# Patient Record
Sex: Female | Born: 1935 | Race: White | Hispanic: No | Smoking: Never smoker
Health system: Southern US, Community
[De-identification: ages and names within clinical notes are randomized; demographics above are authoritative.]

## PROBLEM LIST (undated history)

## (undated) DIAGNOSIS — I350 Nonrheumatic aortic (valve) stenosis: Secondary | ICD-10-CM

## (undated) DIAGNOSIS — I739 Peripheral vascular disease, unspecified: Secondary | ICD-10-CM

## (undated) DIAGNOSIS — R51 Headache: Secondary | ICD-10-CM

## (undated) DIAGNOSIS — I5032 Chronic diastolic (congestive) heart failure: Secondary | ICD-10-CM

## (undated) DIAGNOSIS — M1990 Unspecified osteoarthritis, unspecified site: Secondary | ICD-10-CM

## (undated) DIAGNOSIS — I779 Disorder of arteries and arterioles, unspecified: Secondary | ICD-10-CM

## (undated) DIAGNOSIS — R519 Headache, unspecified: Secondary | ICD-10-CM

## (undated) DIAGNOSIS — C189 Malignant neoplasm of colon, unspecified: Secondary | ICD-10-CM

## (undated) DIAGNOSIS — N184 Chronic kidney disease, stage 4 (severe): Secondary | ICD-10-CM

## (undated) DIAGNOSIS — F418 Other specified anxiety disorders: Secondary | ICD-10-CM

## (undated) DIAGNOSIS — N390 Urinary tract infection, site not specified: Secondary | ICD-10-CM

## (undated) DIAGNOSIS — Z9289 Personal history of other medical treatment: Secondary | ICD-10-CM

## (undated) DIAGNOSIS — K819 Cholecystitis, unspecified: Secondary | ICD-10-CM

## (undated) DIAGNOSIS — K8042 Calculus of bile duct with acute cholecystitis without obstruction: Secondary | ICD-10-CM

## (undated) DIAGNOSIS — J90 Pleural effusion, not elsewhere classified: Secondary | ICD-10-CM

## (undated) DIAGNOSIS — D696 Thrombocytopenia, unspecified: Secondary | ICD-10-CM

## (undated) DIAGNOSIS — I10 Essential (primary) hypertension: Secondary | ICD-10-CM

## (undated) DIAGNOSIS — I482 Chronic atrial fibrillation, unspecified: Secondary | ICD-10-CM

## (undated) DIAGNOSIS — D649 Anemia, unspecified: Secondary | ICD-10-CM

## (undated) DIAGNOSIS — E118 Type 2 diabetes mellitus with unspecified complications: Secondary | ICD-10-CM

## (undated) DIAGNOSIS — A0472 Enterocolitis due to Clostridium difficile, not specified as recurrent: Secondary | ICD-10-CM

## (undated) HISTORY — DX: Anemia, unspecified: D64.9

## (undated) HISTORY — DX: Enterocolitis due to Clostridium difficile, not specified as recurrent: A04.72

## (undated) HISTORY — DX: Peripheral vascular disease, unspecified: I73.9

## (undated) HISTORY — DX: Chronic kidney disease, stage 4 (severe): N18.4

## (undated) HISTORY — PX: FRACTURE SURGERY: SHX138

## (undated) HISTORY — PX: ABDOMINAL HYSTERECTOMY: SHX81

## (undated) HISTORY — DX: Calculus of bile duct with acute cholecystitis without obstruction: K80.42

## (undated) HISTORY — DX: Disorder of arteries and arterioles, unspecified: I77.9

## (undated) HISTORY — PX: COLOSTOMY: SHX63

## (undated) HISTORY — DX: Personal history of other medical treatment: Z92.89

## (undated) HISTORY — DX: Type 2 diabetes mellitus with unspecified complications: E11.8

## (undated) HISTORY — PX: APPENDECTOMY: SHX54

## (undated) HISTORY — DX: Chronic atrial fibrillation, unspecified: I48.20

## (undated) HISTORY — DX: Chronic diastolic (congestive) heart failure: I50.32

---

## 1998-05-02 ENCOUNTER — Emergency Department (HOSPITAL_COMMUNITY): Admission: EM | Admit: 1998-05-02 | Discharge: 1998-05-02 | Payer: Self-pay

## 1999-07-18 ENCOUNTER — Encounter: Payer: Self-pay | Admitting: Emergency Medicine

## 1999-07-18 ENCOUNTER — Emergency Department (HOSPITAL_COMMUNITY): Admission: EM | Admit: 1999-07-18 | Discharge: 1999-07-18 | Payer: Self-pay | Admitting: Emergency Medicine

## 2000-03-24 HISTORY — PX: COLON SURGERY: SHX602

## 2000-09-18 ENCOUNTER — Encounter (INDEPENDENT_AMBULATORY_CARE_PROVIDER_SITE_OTHER): Payer: Self-pay | Admitting: Specialist

## 2000-09-18 ENCOUNTER — Ambulatory Visit (HOSPITAL_COMMUNITY): Admission: RE | Admit: 2000-09-18 | Discharge: 2000-09-18 | Payer: Self-pay | Admitting: Gastroenterology

## 2000-09-25 ENCOUNTER — Encounter: Payer: Self-pay | Admitting: Surgery

## 2000-09-25 ENCOUNTER — Encounter: Admission: RE | Admit: 2000-09-25 | Discharge: 2000-09-25 | Payer: Self-pay | Admitting: Surgery

## 2000-10-15 ENCOUNTER — Encounter: Payer: Self-pay | Admitting: Surgery

## 2000-10-19 ENCOUNTER — Ambulatory Visit (HOSPITAL_COMMUNITY): Admission: RE | Admit: 2000-10-19 | Discharge: 2000-10-19 | Payer: Self-pay | Admitting: Cardiology

## 2000-10-21 ENCOUNTER — Encounter (INDEPENDENT_AMBULATORY_CARE_PROVIDER_SITE_OTHER): Payer: Self-pay | Admitting: Specialist

## 2000-10-21 ENCOUNTER — Inpatient Hospital Stay (HOSPITAL_COMMUNITY): Admission: RE | Admit: 2000-10-21 | Discharge: 2000-10-27 | Payer: Self-pay | Admitting: Surgery

## 2001-08-09 ENCOUNTER — Encounter: Payer: Self-pay | Admitting: Hematology and Oncology

## 2001-08-09 ENCOUNTER — Ambulatory Visit (HOSPITAL_COMMUNITY): Admission: RE | Admit: 2001-08-09 | Discharge: 2001-08-09 | Payer: Self-pay | Admitting: Hematology and Oncology

## 2001-11-09 ENCOUNTER — Ambulatory Visit (HOSPITAL_COMMUNITY): Admission: RE | Admit: 2001-11-09 | Discharge: 2001-11-09 | Payer: Self-pay | Admitting: Gastroenterology

## 2001-12-30 ENCOUNTER — Ambulatory Visit (HOSPITAL_COMMUNITY): Admission: RE | Admit: 2001-12-30 | Discharge: 2001-12-30 | Payer: Self-pay | Admitting: *Deleted

## 2001-12-30 ENCOUNTER — Encounter: Payer: Self-pay | Admitting: *Deleted

## 2002-04-27 ENCOUNTER — Encounter: Payer: Self-pay | Admitting: Orthopedic Surgery

## 2002-04-27 ENCOUNTER — Inpatient Hospital Stay (HOSPITAL_COMMUNITY): Admission: EM | Admit: 2002-04-27 | Discharge: 2002-05-02 | Payer: Self-pay | Admitting: Emergency Medicine

## 2002-11-14 ENCOUNTER — Encounter: Payer: Self-pay | Admitting: Oncology

## 2002-11-14 ENCOUNTER — Ambulatory Visit (HOSPITAL_COMMUNITY): Admission: RE | Admit: 2002-11-14 | Discharge: 2002-11-14 | Payer: Self-pay | Admitting: Oncology

## 2003-05-08 ENCOUNTER — Ambulatory Visit (HOSPITAL_COMMUNITY): Admission: RE | Admit: 2003-05-08 | Discharge: 2003-05-08 | Payer: Self-pay | Admitting: Oncology

## 2003-11-24 ENCOUNTER — Ambulatory Visit (HOSPITAL_COMMUNITY): Admission: RE | Admit: 2003-11-24 | Discharge: 2003-11-24 | Payer: Self-pay | Admitting: Family Medicine

## 2004-02-02 ENCOUNTER — Ambulatory Visit: Payer: Self-pay | Admitting: Oncology

## 2004-05-08 ENCOUNTER — Ambulatory Visit (HOSPITAL_COMMUNITY): Admission: RE | Admit: 2004-05-08 | Discharge: 2004-05-08 | Payer: Self-pay | Admitting: Family Medicine

## 2004-06-23 ENCOUNTER — Emergency Department (HOSPITAL_COMMUNITY): Admission: EM | Admit: 2004-06-23 | Discharge: 2004-06-23 | Payer: Self-pay | Admitting: Emergency Medicine

## 2004-06-24 ENCOUNTER — Ambulatory Visit: Payer: Self-pay | Admitting: Orthopedic Surgery

## 2004-08-05 ENCOUNTER — Ambulatory Visit: Payer: Self-pay | Admitting: Orthopedic Surgery

## 2004-09-24 ENCOUNTER — Emergency Department (HOSPITAL_COMMUNITY): Admission: EM | Admit: 2004-09-24 | Discharge: 2004-09-24 | Payer: Self-pay | Admitting: *Deleted

## 2004-12-09 ENCOUNTER — Ambulatory Visit (HOSPITAL_COMMUNITY): Admission: RE | Admit: 2004-12-09 | Discharge: 2004-12-09 | Payer: Self-pay | Admitting: Family Medicine

## 2005-09-03 ENCOUNTER — Ambulatory Visit (HOSPITAL_COMMUNITY): Admission: RE | Admit: 2005-09-03 | Discharge: 2005-09-03 | Payer: Self-pay | Admitting: Gastroenterology

## 2005-09-16 ENCOUNTER — Ambulatory Visit (HOSPITAL_COMMUNITY): Admission: RE | Admit: 2005-09-16 | Discharge: 2005-09-16 | Payer: Self-pay | Admitting: Family Medicine

## 2005-12-22 ENCOUNTER — Ambulatory Visit (HOSPITAL_COMMUNITY): Admission: RE | Admit: 2005-12-22 | Discharge: 2005-12-22 | Payer: Self-pay | Admitting: Family Medicine

## 2006-01-04 ENCOUNTER — Emergency Department (HOSPITAL_COMMUNITY): Admission: EM | Admit: 2006-01-04 | Discharge: 2006-01-04 | Payer: Self-pay | Admitting: *Deleted

## 2006-09-16 ENCOUNTER — Ambulatory Visit (HOSPITAL_COMMUNITY): Admission: RE | Admit: 2006-09-16 | Discharge: 2006-09-16 | Payer: Self-pay | Admitting: Family Medicine

## 2006-12-31 ENCOUNTER — Ambulatory Visit (HOSPITAL_COMMUNITY): Admission: RE | Admit: 2006-12-31 | Discharge: 2006-12-31 | Payer: Self-pay | Admitting: Family Medicine

## 2007-05-16 ENCOUNTER — Emergency Department (HOSPITAL_COMMUNITY): Admission: EM | Admit: 2007-05-16 | Discharge: 2007-05-16 | Payer: Self-pay | Admitting: Emergency Medicine

## 2007-05-21 ENCOUNTER — Ambulatory Visit (HOSPITAL_COMMUNITY): Admission: RE | Admit: 2007-05-21 | Discharge: 2007-05-21 | Payer: Self-pay | Admitting: Orthopedic Surgery

## 2008-01-21 ENCOUNTER — Ambulatory Visit (HOSPITAL_COMMUNITY): Admission: RE | Admit: 2008-01-21 | Discharge: 2008-01-21 | Payer: Self-pay | Admitting: Family Medicine

## 2008-06-09 ENCOUNTER — Ambulatory Visit: Payer: Self-pay | Admitting: Vascular Surgery

## 2008-06-09 ENCOUNTER — Encounter (HOSPITAL_COMMUNITY): Admission: RE | Admit: 2008-06-09 | Discharge: 2008-07-27 | Payer: Self-pay | Admitting: Family Medicine

## 2008-09-04 ENCOUNTER — Ambulatory Visit (HOSPITAL_COMMUNITY): Admission: RE | Admit: 2008-09-04 | Discharge: 2008-09-04 | Payer: Self-pay | Admitting: Family Medicine

## 2009-02-05 ENCOUNTER — Ambulatory Visit (HOSPITAL_COMMUNITY): Admission: RE | Admit: 2009-02-05 | Discharge: 2009-02-05 | Payer: Self-pay | Admitting: Family Medicine

## 2009-04-25 ENCOUNTER — Ambulatory Visit (HOSPITAL_BASED_OUTPATIENT_CLINIC_OR_DEPARTMENT_OTHER): Admission: RE | Admit: 2009-04-25 | Discharge: 2009-04-25 | Payer: Self-pay | Admitting: Family Medicine

## 2009-04-25 ENCOUNTER — Ambulatory Visit: Payer: Self-pay | Admitting: Diagnostic Radiology

## 2009-10-13 ENCOUNTER — Inpatient Hospital Stay (HOSPITAL_COMMUNITY): Admission: EM | Admit: 2009-10-13 | Discharge: 2009-10-18 | Payer: Self-pay | Admitting: Emergency Medicine

## 2009-10-13 ENCOUNTER — Ambulatory Visit: Payer: Self-pay | Admitting: Cardiology

## 2009-10-15 ENCOUNTER — Encounter (INDEPENDENT_AMBULATORY_CARE_PROVIDER_SITE_OTHER): Payer: Self-pay | Admitting: Internal Medicine

## 2009-10-17 ENCOUNTER — Encounter (INDEPENDENT_AMBULATORY_CARE_PROVIDER_SITE_OTHER): Payer: Self-pay | Admitting: Internal Medicine

## 2010-01-18 ENCOUNTER — Ambulatory Visit: Payer: Self-pay | Admitting: Diagnostic Radiology

## 2010-01-18 ENCOUNTER — Emergency Department (HOSPITAL_BASED_OUTPATIENT_CLINIC_OR_DEPARTMENT_OTHER): Admission: EM | Admit: 2010-01-18 | Discharge: 2010-01-18 | Payer: Self-pay | Admitting: Emergency Medicine

## 2010-02-25 ENCOUNTER — Ambulatory Visit (HOSPITAL_COMMUNITY)
Admission: RE | Admit: 2010-02-25 | Discharge: 2010-02-25 | Payer: Self-pay | Source: Home / Self Care | Admitting: Family Medicine

## 2010-06-08 LAB — DIFFERENTIAL
Basophils Absolute: 0.1 10*3/uL (ref 0.0–0.1)
Basophils Relative: 1 % (ref 0–1)
Lymphocytes Relative: 21 % (ref 12–46)
Monocytes Relative: 6 % (ref 3–12)
Neutro Abs: 8.5 10*3/uL — ABNORMAL HIGH (ref 1.7–7.7)

## 2010-06-08 LAB — CBC
HCT: 34.1 % — ABNORMAL LOW (ref 36.0–46.0)
Hemoglobin: 11.3 g/dL — ABNORMAL LOW (ref 12.0–15.0)
Hemoglobin: 9.2 g/dL — ABNORMAL LOW (ref 12.0–15.0)
MCH: 29.9 pg (ref 26.0–34.0)
MCH: 30.4 pg (ref 26.0–34.0)
MCH: 30.4 pg (ref 26.0–34.0)
MCH: 30.8 pg (ref 26.0–34.0)
MCHC: 32.8 g/dL (ref 30.0–36.0)
MCHC: 33.2 g/dL (ref 30.0–36.0)
MCHC: 33.2 g/dL (ref 30.0–36.0)
MCV: 91.4 fL (ref 78.0–100.0)
MCV: 91.5 fL (ref 78.0–100.0)
Platelets: 161 10*3/uL (ref 150–400)
Platelets: 171 10*3/uL (ref 150–400)
Platelets: 172 10*3/uL (ref 150–400)
Platelets: 173 10*3/uL (ref 150–400)
Platelets: 174 10*3/uL (ref 150–400)
Platelets: ADEQUATE 10*3/uL (ref 150–400)
RBC: 3.19 MIL/uL — ABNORMAL LOW (ref 3.87–5.11)
RBC: 3.23 MIL/uL — ABNORMAL LOW (ref 3.87–5.11)
RDW: 15.4 % (ref 11.5–15.5)
RDW: 15.4 % (ref 11.5–15.5)
RDW: 15.7 % — ABNORMAL HIGH (ref 11.5–15.5)
RDW: 16.1 % — ABNORMAL HIGH (ref 11.5–15.5)
RDW: 16.3 % — ABNORMAL HIGH (ref 11.5–15.5)
WBC: 10.8 10*3/uL — ABNORMAL HIGH (ref 4.0–10.5)
WBC: 9.9 10*3/uL (ref 4.0–10.5)

## 2010-06-08 LAB — BASIC METABOLIC PANEL
Calcium: 8.3 mg/dL — ABNORMAL LOW (ref 8.4–10.5)
Calcium: 8.4 mg/dL (ref 8.4–10.5)
Creatinine, Ser: 5.1 mg/dL — ABNORMAL HIGH (ref 0.4–1.2)
Creatinine, Ser: 5.5 mg/dL — ABNORMAL HIGH (ref 0.4–1.2)
GFR calc Af Amer: 9 mL/min — ABNORMAL LOW (ref >60)
GFR calc non Af Amer: 8 mL/min — ABNORMAL LOW (ref >60)
Glucose, Bld: 151 mg/dL — ABNORMAL HIGH (ref 70–99)
Sodium: 137 mEq/L (ref 135–145)

## 2010-06-08 LAB — STOOL CULTURE

## 2010-06-08 LAB — RENAL FUNCTION PANEL
Albumin: 2.6 g/dL — ABNORMAL LOW (ref 3.5–5.2)
Chloride: 112 mEq/L (ref 96–112)
GFR calc Af Amer: 13 mL/min — ABNORMAL LOW (ref >60)
GFR calc non Af Amer: 11 mL/min — ABNORMAL LOW (ref >60)
Phosphorus: 4.2 mg/dL (ref 2.3–4.6)
Potassium: 4.9 mEq/L (ref 3.5–5.1)

## 2010-06-08 LAB — POCT CARDIAC MARKERS: CKMB, poc: 1.1 ng/mL (ref 1.0–8.0)

## 2010-06-08 LAB — PROTEIN ELECTROPH W RFLX QUANT IMMUNOGLOBULINS
Albumin ELP: 54.3 % — ABNORMAL LOW (ref 55.8–66.1)
Alpha-1-Globulin: 9.4 % — ABNORMAL HIGH (ref 2.9–4.9)
Gamma Globulin: 9.5 % — ABNORMAL LOW (ref 11.1–18.8)
Total Protein ELP: 5.6 g/dL — ABNORMAL LOW (ref 6.0–8.3)

## 2010-06-08 LAB — COMPREHENSIVE METABOLIC PANEL
AST: 21 U/L (ref 0–37)
AST: 22 U/L (ref 0–37)
Albumin: 2.7 g/dL — ABNORMAL LOW (ref 3.5–5.2)
BUN: 61 mg/dL — ABNORMAL HIGH (ref 6–23)
BUN: 63 mg/dL — ABNORMAL HIGH (ref 6–23)
CO2: 18 mEq/L — ABNORMAL LOW (ref 19–32)
Calcium: 8.3 mg/dL — ABNORMAL LOW (ref 8.4–10.5)
Calcium: 8.4 mg/dL (ref 8.4–10.5)
Calcium: 8.6 mg/dL (ref 8.4–10.5)
Creatinine, Ser: 5.52 mg/dL — ABNORMAL HIGH (ref 0.4–1.2)
Creatinine, Ser: 5.64 mg/dL — ABNORMAL HIGH (ref 0.4–1.2)
GFR calc Af Amer: 10 mL/min — ABNORMAL LOW (ref >60)
GFR calc Af Amer: 9 mL/min — ABNORMAL LOW (ref >60)
GFR calc Af Amer: 9 mL/min — ABNORMAL LOW (ref >60)
GFR calc non Af Amer: 7 mL/min — ABNORMAL LOW (ref >60)
Glucose, Bld: 184 mg/dL — ABNORMAL HIGH (ref 70–99)
Glucose, Bld: 89 mg/dL (ref 70–99)
Total Bilirubin: 0.5 mg/dL (ref 0.3–1.2)
Total Protein: 5.4 g/dL — ABNORMAL LOW (ref 6.0–8.3)

## 2010-06-08 LAB — GLUCOSE, CAPILLARY
Glucose-Capillary: 116 mg/dL — ABNORMAL HIGH (ref 70–99)
Glucose-Capillary: 117 mg/dL — ABNORMAL HIGH (ref 70–99)
Glucose-Capillary: 118 mg/dL — ABNORMAL HIGH (ref 70–99)
Glucose-Capillary: 123 mg/dL — ABNORMAL HIGH (ref 70–99)
Glucose-Capillary: 131 mg/dL — ABNORMAL HIGH (ref 70–99)
Glucose-Capillary: 138 mg/dL — ABNORMAL HIGH (ref 70–99)
Glucose-Capillary: 142 mg/dL — ABNORMAL HIGH (ref 70–99)
Glucose-Capillary: 143 mg/dL — ABNORMAL HIGH (ref 70–99)
Glucose-Capillary: 178 mg/dL — ABNORMAL HIGH (ref 70–99)
Glucose-Capillary: 185 mg/dL — ABNORMAL HIGH (ref 70–99)
Glucose-Capillary: 74 mg/dL (ref 70–99)
Glucose-Capillary: 93 mg/dL (ref 70–99)
Glucose-Capillary: 94 mg/dL (ref 70–99)

## 2010-06-08 LAB — RETICULOCYTES
RBC.: 3.54 MIL/uL — ABNORMAL LOW (ref 3.87–5.11)
Retic Count, Absolute: 28.3 10*3/uL (ref 19.0–186.0)
Retic Ct Pct: 0.8 % (ref 0.4–3.1)

## 2010-06-08 LAB — URINALYSIS, ROUTINE W REFLEX MICROSCOPIC
Bilirubin Urine: NEGATIVE
Glucose, UA: NEGATIVE mg/dL
Hgb urine dipstick: NEGATIVE
Nitrite: NEGATIVE
Protein, ur: NEGATIVE mg/dL
Specific Gravity, Urine: 1.014 (ref 1.005–1.030)
Urobilinogen, UA: 0.2 mg/dL (ref 0.0–1.0)

## 2010-06-08 LAB — HEMOCCULT GUIAC POC 1CARD (OFFICE)
Fecal Occult Bld: POSITIVE
Fecal Occult Bld: POSITIVE

## 2010-06-08 LAB — CULTURE, BLOOD (ROUTINE X 2)

## 2010-06-08 LAB — CLOSTRIDIUM DIFFICILE EIA: C difficile Toxins A+B, EIA: NEGATIVE

## 2010-06-08 LAB — IRON AND TIBC
Iron: 85 ug/dL (ref 42–135)
UIBC: 116 ug/dL

## 2010-06-08 LAB — URINE CULTURE: Culture: NO GROWTH

## 2010-06-08 LAB — FERRITIN: Ferritin: 409 ng/mL — ABNORMAL HIGH (ref 10–291)

## 2010-06-08 LAB — SEDIMENTATION RATE: Sed Rate: 28 mm/hr — ABNORMAL HIGH (ref 0–22)

## 2010-06-08 LAB — TSH: TSH: 0.774 u[IU]/mL (ref 0.350–4.500)

## 2010-07-04 LAB — GLUCOSE, CAPILLARY: Glucose-Capillary: 82 mg/dL (ref 70–99)

## 2010-07-19 ENCOUNTER — Encounter (HOSPITAL_COMMUNITY): Payer: Medicare Other | Attending: Family Medicine

## 2010-07-19 DIAGNOSIS — D631 Anemia in chronic kidney disease: Secondary | ICD-10-CM | POA: Insufficient documentation

## 2010-07-19 DIAGNOSIS — N189 Chronic kidney disease, unspecified: Secondary | ICD-10-CM | POA: Insufficient documentation

## 2010-07-19 DIAGNOSIS — N039 Chronic nephritic syndrome with unspecified morphologic changes: Secondary | ICD-10-CM | POA: Insufficient documentation

## 2010-07-19 DIAGNOSIS — I129 Hypertensive chronic kidney disease with stage 1 through stage 4 chronic kidney disease, or unspecified chronic kidney disease: Secondary | ICD-10-CM | POA: Insufficient documentation

## 2010-07-26 ENCOUNTER — Encounter (HOSPITAL_COMMUNITY): Payer: Medicare Other | Attending: Family Medicine

## 2010-07-26 DIAGNOSIS — N189 Chronic kidney disease, unspecified: Secondary | ICD-10-CM | POA: Insufficient documentation

## 2010-07-26 DIAGNOSIS — I129 Hypertensive chronic kidney disease with stage 1 through stage 4 chronic kidney disease, or unspecified chronic kidney disease: Secondary | ICD-10-CM | POA: Insufficient documentation

## 2010-07-26 DIAGNOSIS — D631 Anemia in chronic kidney disease: Secondary | ICD-10-CM | POA: Insufficient documentation

## 2010-08-02 ENCOUNTER — Encounter (HOSPITAL_COMMUNITY): Payer: Medicare Other

## 2010-08-06 NOTE — Procedures (Signed)
LOWER EXTREMITY ARTERIAL EVALUATION-SINGLE LEVEL   INDICATION:  Bilateral lower extremity pain at night.   HISTORY:  Diabetes:  Yes.  Cardiac:  No.  Hypertension:  Yes.  Smoking:  No.  Previous Surgery:  No.  The patient reports leg cramping at night which is relieved by eating a  spoonful of mustard.   RESTING SYSTOLIC PRESSURES: (ABI)                          RIGHT                LEFT  Brachial:               186                  180  Anterior tibial:        194                    (71.0)  190  Posterior tibial:         (71.0)  198        188  Peroneal:  DOPPLER WAVEFORM ANALYSIS:  Anterior tibial:        Triphasic            Triphasic  Posterior tibial:       Triphasic            Triphasic  Peroneal:   PREVIOUS ABI'S:  Date:  RIGHT:  LEFT:   IMPRESSION:  ABIs and lower extremity Doppler waveforms suggest no  significant arterial occlusive disease bilaterally.   ___________________________________________  Di Kindle. Edilia Bo, M.D.   MC/MEDQ  D:  06/09/2008  T:  06/09/2008  Job:  [id number]

## 2010-08-06 NOTE — Procedures (Signed)
CAROTID DUPLEX EXAM   INDICATION:  Dizziness, presyncopal episode.   HISTORY:  Diabetes:  Yes.  Cardiac:  No.  Hypertension:  Yes.  Smoking:  No.  Previous Surgery:  No.  CV History:  Presyncopal episode with dizziness which lasted for several  minutes 2 weeks ago.  Amaurosis Fugax No, Paresthesias No, Hemiparesis No.                                       RIGHT             LEFT  Brachial systolic pressure:         186               180  Brachial Doppler waveforms:         Triphasic         Triphasic  Vertebral direction of flow:        Antegrade         Antegrade  DUPLEX VELOCITIES (cm/sec)  CCA peak systolic                   41                44  ECA peak systolic                   93                108  ICA peak systolic                   79                72  ICA end diastolic                   21                22  PLAQUE MORPHOLOGY:                  Mixed             Calcified  PLAQUE AMOUNT:                      Minimal           Mild  PLAQUE LOCATION:                    Proximal ICA      Proximal ICA   IMPRESSION:  A 20% to 39% ICA stenosis bilaterally.   ___________________________________________  Di Kindle. Edilia Bo, M.D.   MC/MEDQ  D:  06/09/2008  T:  06/09/2008  Job:  [id number]

## 2010-08-09 NOTE — Consult Note (Signed)
NAME:  SHREYA, LACASSE                         ACCOUNT NO.:  [id number]   MEDICAL RECORD NO.:  [id number]                   PATIENT TYPE:  INP   LOCATION:  5027                                 FACILITY:  [facility]   PHYSICIAN:  Meade Maw, M.D.                 DATE OF BIRTH:  08/08/1925   DATE OF CONSULTATION:  DATE OF DISCHARGE:                                   CONSULTATION   REFERRING PHYSICIAN:  Robert A. Thurston Hole, M.D.   REASON FOR CONSULTATION:  Preop evaluation prior to right tibia-fibula  repair.   HISTORY OF PRESENT ILLNESS:  The patient is a very pleasant 77 year old  female who slipped on the ice while at home today, which resulted in the  fracture of her right tibia and fibula.  The patient states she has been  extremely active in the summer, performing all yard work, Catering manager.  She  continues to do her housework in the winter, which includes vacuuming.  She  denies shortness of breath, chest pain, orthopnea, presyncope, syncope,  tachyarrhythmia, and pedal edema.  She had a stress Cardiolite performed in  July 2002 for further evaluation of abnormal EKG.  She achieved her target  heart rate on the stress test.  There was no evidence of ischemia or fixed  defect.  Her ejection fraction was 65%.  She has been compliant with all of  her medications and states her diabetes is under good control.   PAST MEDICAL HISTORY:  1. Diabetes mellitus.  2. Hypertension.  3. Anxiety disorder.  4. Rectal carcinoma.  5. Adenocarcinoma.   PAST SURGICAL HISTORY:  1. Significant for colon cancer.  She has had colon surgery x3 with a     colonoscopy.  2. Appendectomy.  3. Hypertension.   MEDICATIONS:  1. Glucophage XR 500 mg two b.i.d.  2. Amitriptyline 50 mg p.o. q.h.s.  3. Xanax 0.5 mg b.i.d.  4. Actos 30 mg p.o. q.a.m.  5. Lisinopril 20 mg p.o. daily   ALLERGIES:  No known drug allergies.   REVIEW OF SYSTEMS:  She wears glasses.  There has been no change in her  bowel or  bladder habits.  Review of systems is otherwise as noted above.   SOCIAL HISTORY:  She is widowed.  Her husband passed two years prior.  She  has a very large, supportive family.  She lives alone.  No history of  alcohol, tobacco, or illicit drug use.   PHYSICAL EXAMINATION:  VITAL SIGNS:  Blood pressure is 180/80, heart rate  was 70, respiratory rate was 20.  She is afebrile.  HEENT:  Unremarkable.  NECK:  She has good carotid upstrokes.  No carotid bruits are noted.  CHEST:  Breath sounds equal and carpal tunnel syndrome.  CARDIAC:  A regular rate and rhythm.  There is an occasional premature beat  noted.  ABDOMEN:  Soft, nontender.  Colostomy is in place.  EXTREMITIES:  Distal pulses which are palpable over the left, unable to  palpate the right secondary to a splint in place.  NEUROLOGIC:  Nonfocal.   LABORATORY DATA:  White count is 8.7, hemoglobin 10, platelet count 152.  Creatinine 1.0, glucose 120, potassium 4.2.  She has normal liver enzymes.   ECG reveals a wandering baseline.  There is lost R-wave progression in the  precordial leads, suggesting anterior septal MI.  No old EKG for comparison.   IMPRESSION:  1. A pleasant 77 year old female with no cardiac history identified.  You     may proceed with surgery as planned.  She will be at a low risk for     cardiac event.  Her history is suggestive of more than _____ with poor     health maintenance.  The patient should be started on aspirin when able     to do so in the postoperative period.  2. Diabetes mellitus.  Would hold her Glucophage during her perioperative     period for at least 48 hours, start glyburide at 10 mg daily.  Continue     with Actos at 30 mg daily.  She should be started on a sliding scale     insulin with replacement as ordered.  3. Deep vein thrombosis prophylaxis.  Will defer this management issue to     you.  4. Dyslipidemia.  Her LDL goal is less than 100.  Statin profile will be     obtained  in the a.m.                                               Meade Maw, M.D.    HP/MEDQ  D:  04/27/2002  T:  04/28/2002  Job:  [id number]

## 2010-08-09 NOTE — Op Note (Signed)
NAME:  [name], HEUBERGER                         ACCOUNT NO.:  [id number]   MEDICAL RECORD NO.:  [id number]                   PATIENT TYPE:  INP   LOCATION:  5027                                 FACILITY:  [facility]   PHYSICIAN:  Robert A. Thurston Hole, M.D.              DATE OF BIRTH:  1925-05-11   DATE OF PROCEDURE:  DATE OF DISCHARGE:                                 OPERATIVE REPORT   PREOPERATIVE DIAGNOSIS:  Right tibia/fibula fracture.   POSTOPERATIVE DIAGNOSIS:  Right tibia/fibula fracture.   OPERATION:  Closed reduction with intramedullary rodding of the right  tibia/fibula fracture using a DePuy/A 9 mm x 31.5 cm interlocking tibial  rod.   ANESTHESIA:  General.   SURGEON:  Robert A. Thurston Hole, M.D.   ASSISTANT:  Julien Girt, P.A.   OPERATIVE TIME:  1 hour, 10 minutes.   COMPLICATIONS:  None.   DESCRIPTION OF PROCEDURE:  Ms. Levay was brought to the operating room on  04/27/02, placed on the operative table in the supine position.  After an  adequate level of general anesthesia was obtained, she received Ancef 1 gram  IV preoperatively for prophylaxis.  She had a Foley catheter placed under  sterile conditions.  Her right foot and leg was prepped using sterile  technique.  Initially, through a 4 cm longitudinal incision based over the  patellar tendon to expose the proximal tibia, initial exposure was made.  Then along with subcutaneous tissues, patellar tendon was split  longitudinally.  A curved awl was used to broach the proximal tibia and then  a T-handle reamer was placed down the proximal tibia, followed by a ball-tip  guide wire placed down the proximal tibial shaft, across the fracture site  under fluoroscopic control and into the distal tibial shaft to the level of  the distal tibial epiphyseal scar.  This was measured for length and found  to be 31.5 cm.  This was then over drilled to 10 mm.  The guide wire was  then changed to a smaller guide wire and then a 9 mm  x 31.5 cm tibial rod  was into position over the intramedullary guide pin, across the fracture  site under fluoroscopic control and into the distal tibia in satisfactory  position.  After this was done, a proximal interlocking screw was placed  through one of the alignment guide positions on the proximal rod of the  appropriate length through a 1 cm medial incision.  After this was done, the  distal interlocking screws were placed through a 3 cm medial incision under  fluoroscopic control, each of the distal interlocking screw holes were  drilled and measured in appropriate length interlocking screws placed.  After this was done, the fracture was examined under fluoroscopic guidance,  found to be in satisfactory position and the hardware in excellent position  as well.  All wounds were the irrigated and closed with 2-0 Vicryl and  skin  staples,  sterile dressings and a short leg splint applied.  Neurovascular status was  found to be normal.  The patient then awakened and taken to the recovery  room in stable condition.  Needle and sponge count was correct x2 at the end  of the case.                                                Robert A. Thurston Hole, M.D.    RAW/MEDQ  D:  04/27/2002  T:  04/28/2002  Job:  [id number]

## 2010-08-09 NOTE — Discharge Summary (Signed)
[facility]  Patient:    [name], [name] Visit Number: [id number] MRN: [id number]          Service Type: SUR Location: 3E 0301 02 Attending Physician:  Andre Lefort Dictated by:   Sandria Bales. Ezzard Standing, M.D. Adm. Date:  10/21/2000 Disc. Date: 10/27/2000   CC:         Carmine Savoy, M.D.  John C. Madilyn Fireman, M.D.   Discharge Summary  DATE OF BIRTH:  07-Oct-1925  DISCHARGE DIAGNOSES: 1. Moderately differentiated adenocarcinoma of the right colon with no    metastases to nine lymph nodes (T4, N0, M0 carcinoma). 2. Diabetes mellitus, on oral medication. 3. History of anxiety. 4. Peristomal hernia. 5. History of prior abdominal perineal resection for rectal carcinoma.  HISTORY OF PRESENT ILLNESS:  This is a 77 year old white female, patient of Dr. Morton Peters Free and Dr. Dorena Cookey, who underwent a colonoscopy by Dr. Dorena Cookey for followup of her rectal cancer on September 10, 2000.  She was found to have a tumor of right colon suspicious for an adenocarcinoma.  She had a prior abdominal perineal resection by Dr. Carla Drape in 1983, has been disease free from rectal cancer since that time.  PAST MEDICAL HISTORY: 1. Diabetes, on Glucophage 1000 mg q.a.m. and q.p.m., and Actos 30 mg q.d. 2. History of mild anxiety, she takes sleeping pills. 3. She has had occasionally recurrent urinary tract infections.  PHYSICAL EXAMINATION:  ABDOMEN:  Well healed midline incision with a colostomy in the left lower quadrant.  She does appear to have a hernia around her colostomy site, and we can address this at the time of surgery.  HOSPITAL COURSE:  The patient completed and oral and mechanical antibiotic bowel prep at home, and presented to the [facility] Emergency Room on 10/21/00.  She underwent a right hemicolectomy, a repair of a peristomal hernia, and enterolysis of adhesions.  Postoperatively, she did well.  On her first postoperative day her hemoglobin was  10.3, hematocrit 31, white blood cell count 10,400.  Her basic metabolic panel showed a sodium of 140, potassium 3.4, chloride of 110, CO2 of 27, glucose of 188, BUN of 10.  Her Foley was removed on the first postoperative day.  Her diabetes remained stable.  By the fourth postoperative day we started her on liquids.  By the sixth postoperative day, she was doing well with her diet, she was afebrile, her abdominal wound looks good, and she was ready for discharge.  Her final pathology showed a moderately differentiated adenocarcinoma of the right colon extending to the serosa with 9 lymph nodes involving with no cancer.  Her maximum tumor was 4.7 cm, so she was a T4, N0, M0 carcinoma.  Her CEA on 7/25, was 4.0.  Her other liver function tests were within normal limits.  Again, she was ready for discharge on October 27, 2000.  DISCHARGE MEDICATIONS: 1. Vicodin for pain. 2. Resume her regular home medications.  ACTIVITY:  As tolerated.  DIET:  Resume her diabetic diet.  She could shower.  FOLLOWUP:  Will see me in about seven days postoperatively. Dictated by:   Sandria Bales. Ezzard Standing, M.D. Attending Physician:  Andre Lefort DD:  11/11/00 TD:  11/12/00 Job: [id number]

## 2010-08-09 NOTE — Procedures (Signed)
[facility]  Patient:    [name], [name]                         MRN: [id number] Proc. Date: 09/18/00 Adm. Date:  [id number] Attending:  Louie Bun CC:         Marinda Elk, M.D.   Procedure Report  PROCEDURE:  Colonoscopy.  INDICATION FOR PROCEDURE:  History of colon cancer 19 years ago requiring colostomy with last colonoscopy approximately eight years ago.  DESCRIPTION OF PROCEDURE:  The patient had been sedated with 50 IV Demerol and 6.5 mg of IV Versed for the previous EGD, and no further sedation was required for this procedure.  The Olympus video colonoscope was inserted through the ostomy and advanced to the cecum, confirmed by transillumination at McBurneys point and visualization of the ileocecal valve and appendiceal orifice.  The prep was excellent.  The cecum appeared normal.  Within the ascending colon just distal to and opposite from the ileocecal valve was a 5-6 cm friable mass consistent with carcinoma.  Multiple biopsies were taken.  The remainder of the ascending, transverse, and descending colon down the ostomy appeared normal with no masses, polyps, diverticula, or other mucosal abnormalities. The scope was withdrawn through the ostomy, and the patient returned to the recovery room in stable condition.  She tolerated the procedure well, and there were no immediate complications.  IMPRESSION:  Ascending colon mass suspicious for malignancy.  PLAN:  Await biopsy results and will obtain CT scan to assess for metastasis prior to referring for surgery. DD:  09/18/00 TD:  09/18/00 Job: [id number]

## 2010-08-09 NOTE — Discharge Summary (Signed)
NAME:  [name], [name]                         ACCOUNT NO.:  [id number]   MEDICAL RECORD NO.:  [id number]                   PATIENT TYPE:  INP   LOCATION:  5027                                 FACILITY:  [facility]   PHYSICIAN:  Robert A. Thurston Hole, M.D.              DATE OF BIRTH:  [birth year]   DATE OF ADMISSION:  04/27/2002  DATE OF DISCHARGE:  05/02/2002                                 DISCHARGE SUMMARY   ADMISSION DIAGNOSES:  1. Right tibial fracture.  2. Hypertension.  3. Non-insulin dependent diabetes mellitus.  4. Coronary artery disease.   DISCHARGE DIAGNOSES:  1. Right tib/fib fracture.  2. Hypertension.  3. Non-insulin dependent diabetes mellitus.  4. Coronary artery disease.  5. Urinary retention.   HISTORY OF PRESENT ILLNESS:  The patient is an 77 year old female who fell  today, slipping on the ice at home, injuring her right leg. She was brought  by ambulance to [facility] where she was noted to have a tib/fib  fracture.  Due to her cardiac history, Dr. Fraser Din was called to evaluate  her and clear her for surgery.  She was determined to be a low risk for  cardiac event. Therefore, cleared for surgery.   HOSPITAL COURSE:  She underwent an IM nailing of her right tibial by Dr.  Thurston Hole. She tolerated the procedure well.  On postoperative day #1,  hemoglobin was 9.4.  We discontinued her PCA, HEP-locked her IV,  discontinued her Foley. On postoperative day #2, the patient was having a  significant amount of difficulty with mobilization. Hemoglobin was 8.8, TMAX  was 100.7.  She was metabolically stable except her sugars were high at 232.  On postoperative day #3, urology was consulted for urinary retention.  She  was afebrile. Surgical wounds were well approximated. Her Foley was removed  per urology suggestion.  She was in-and-out catheterized twice.  Hemoglobin  was 8.2.  She was metabolically stable. Urecholine was started for urinary  retention.  On  postoperative day #5, the patient was finally able to void on  her own.  She was discharged to home in stable condition. Her hemoglobin was  8.2.  She will follow up with [facility], M.D. on an outpatient  basis.   DISCHARGE MEDICATIONS:  1. Nu-Iron.  2. Percocet.  3. Urecholine.   She had TED hose placed. She will have home health physical therapy and 24-  hour care from her daughter. We will see her back in the office in 7 to 10  days. They have been instructed to call with increased pain, increased  drainage. She is nonweightbearing on her right leg and she is on a regular  diet.    Kirstin Shepperson, P.A.                  Robert A. Thurston Hole, M.D.   KS/MEDQ  D:  06/08/2002  T:  06/09/2002  Job:  [id number]

## 2010-08-09 NOTE — Procedures (Signed)
[facility]  Patient:    RONEKA, GILPIN                         MRN: [id number] Proc. Date: 09/18/00 Adm. Date:  [id number] Attending:  Louie Bun CC:         Marinda Elk, M.D.   Procedure Report  PROCEDURE:  Esophagogastroduodenoscopy.  INDICATION FOR PROCEDURE:  Intermittent solid food dysphagia suggestive of a lower esophageal ring or stricture.  DESCRIPTION OF PROCEDURE:  The patient was placed in the left lateral decubitus position and placed on the pulse monitor with continuous low-flow oxygen delivered by nasal cannula.  She was sedated with 50 mg IV Demerol and 6.5 mg IV Versed.  The Olympus video endoscope was advanced under direct vision into the oropharynx and esophagus.  The esophagus was straight and of normal caliber with the squamocolumnar line at 38 cm with a small amount of irritation and irregularity to the gastroesophageal junction and squamocolumnar line consistent with mild esophagitis.  There was no visible ring or stricture.  The stomach was entered, and a small amount of liquid secretions were suctioned from the fundus.  Retroflexed view of the cardia was unremarkable.  The fundus, body, antrum, and pylorus all appeared normal.  The duodenum was entered, and both the bulb and second portion were inspected and appeared to be within normal limits.  A Savary guidewire was passed through the endoscope channel, and the scope withdrawn.  A single 16 mm Savary dilator was passed to mild resistance over the guidewire and then withdrawn with the wire.  There was minimal resistance and no blood seen on the dilator.  The scope was then and the patient returned to the recovery room in stable condition.  She tolerated the procedure well, and there were no immediate complications.  IMPRESSION:  Minimal esophagitis, no visible ring or stricture, status post dilatation to 16 mm due to dysphagia.  PLAN:  Advance diet and observe  response to dilatation. DD:  09/18/00 TD:  09/18/00 Job: [id number]

## 2010-08-09 NOTE — Op Note (Signed)
[place]. [facility]  Patient:    [name], [name]                      MRN: [id number] Proc. Date: 10/21/00 Adm. Date:  10/21/00 Attending:  Sandria Bales. Ezzard Standing, M.D. CC:         Marinda Elk, M.D.  John C. Madilyn Fireman, M.D.   Operative Report  DATE OF BIRTH:  1925-07-15.  PREOPERATIVE DIAGNOSES: 1. Right colon lesion. 2. Peristomal hernia.  POSTOPERATIVE DIAGNOSES: 1. Right colon cancer. 2. Peristomal hernia approximately 5 cm in diameter. 3. Extensive intra-abdominal adhesions.  OPERATIONS PERFORMED: 1. Right hemicolectomy. 2. Repair of peristomal hernia. 3. Lysis of adhesions.  SURGEON:  Sandria Bales. Ezzard Standing, M.D.  ASSISTANT:  Chevis Pretty, M.D.  ANESTHESIA:  General endotracheal.  ESTIMATED BLOOD LOSS:  About 150 cc.  DRAINS:  None.  INDICATIONS FOR PROCEDURE:  [name] is a 77 year old white female who is a patient of Dr. Dewaine Oats, underwent a recent colonoscopy by Dr. Dorena Cookey, was found to have a right colon carcinoma.  Interestingly, she had an abdominoperineal resection by Dr. _________ in 1983 for a rectal carcinoma and this is her second primary colon cancer.  She has done well since her original cancer some 20 years ago and has been disease free from that.  The patient now comes for right hemicolectomy and has undergone a mechanical and antibiotic bowel prep at home.  DESCRIPTION OF PROCEDURE:  The patient presented to the operating room where she had PAS stockings in place, was given a gram of Cefotetan at the initiation of the procedure and had an NG tube in place, Foley catheter in place, her abdomen was prepped and draped with Betadine solution and sterilely draped.  Abdominal incision was made with sharp dissection carried down to the abdominal cavity.  The patient was noted to have extensive intra-abdominal adhesions attached the anterior abdominal wall besides her abdominoperineal. She did undergo a stoma revision by Dr.  Katrinka Blazing about 1992 and he had put in a piece of mesh in her left lower quadrant where her old stoma was.  I ran into this mesh during my dissection into abdominal cavity and this had extensive adhesions to it intra-abdominally.  I spent probably better than an hour lysing small bowel adhesions and omental adhesions.  I was unable to free everything out of her pelvis, but I freed up enough of her distal loops to see her distal two thirds of the small bowel totally clear and enough to provide anastomosis.  Her uterus and ovaries were noted to be absent.  The right and left lobe of the liver were unremarkable without mass or nodule.  The gallbladder was unremarkable without mass or nodule.  Her stomach had an NG tube in place without mass or nodule.  Again the small bowel was unable to free up  her entire small bowel in that it was densely around the left colostomy and at the brim of the pelvis.  I did free up the distal one half to two thirds of her small bowel.  I freed up the colon to the midline.  The appendix was absent.  I identified a ureter which was well posterior on dissection and freed up the hepatic flexure.  I then divided the distal small bowel and the left and the right transverse colon using a GIA-70 stapler.  I then divided the mesentery using Kelly clamps.  I tried to get an  adequate dissection down to the root of the right colic artery.  The duodenum was identified and swept posteriorly and the kidney also was identified and swept posteriorly.  The right colon was removed and sent to pathology.  I then irrigated the right colon. I then did a side-to-side stapled anastomosis using the GIA stapler for the distal small bowel to the right transverse colon.  I then closed the defect in the small bowel and colon with interrupted 2-0 silk sutures.  I then buttressed the wound, closed the mesentery defect with a 2-0 chromic suture and irrigated out the abdomen.  I then turned my  attention to the colostomy.  The patient had about a 5 to 6 cm defect for her ostomy, so she had about three fingers at least around where the colostomy came out. What I did is I freed up some of the tissues and I did put three stitches trying to cinch this down to her maybe about one and a half to two fingerbreadths going around the colostomy.  Hopefully this will reduce the bulge that she was experiencing.  At this point then I irrigated out the abdominal cavity with 3L of saline.  We had lost maybe 150 cc of blood, had closed the abdominal wall with interrupted #1 Novofil sutures.  She had also had some old either Novofil Prolene that I removed from her abdominal wall. She also had some kind of swiss cheese sort of deformity in her midline incision which was a small hernia.  I tried to encompass all this with grabbing her fascia in single bites.  I then closed the skin with a skin gun. I irrigated out the subcutaneous tissues and I removed the suture holding the colostomy closed.  The patient was transferred to the recovery room in good condition.  Estimated blood loss maybe 150 cc.  We did not transfuse her.  The patient tolerated the procedure well. DD:  10/21/00 TD:  10/21/00 Job: [id number]

## 2010-08-09 NOTE — Op Note (Signed)
   TNAMERICCI, PAFF                        ACCOUNT NO.:  [id number]   MEDICAL RECORD NO.:  [id number]                   PATIENT TYPE:  AMB   LOCATION:  ENDO                                 FACILITY:  [facility]   PHYSICIAN:  Barrie Folk, M.D.                  DATE OF BIRTH:  Aug 06, 1925   DATE OF PROCEDURE:  DATE OF DISCHARGE:                                 OPERATIVE REPORT   PROCEDURE:  Colonoscopy.   INDICATIONS FOR PROCEDURE:  History of rectal cancer requiring AP resection  may years ago and revision 9 years ago with no colon surveillance since.   DESCRIPTION OF PROCEDURE:  The patient was placed in the left lateral  decubitus position then placed on the pulse monitor with continuous low flow  oxygen delivered by nasal cannula. She was sedated with 80 mg IV Demerol and  8 mg IV Versed. The Olympus video colonoscope was inserted into the ostomy  site and advanced to the ileocolonic anastomosis. There appeared to be an  open surgical anastomosis there. The colon distal to the anastomosis all the  way down to the stoma appeared normal with no masses, polyps, diverticula or  other mucosal abnormalities.  The scope was then withdrawn and the patient  returned to the recovery room in stable condition. The patient tolerated the  procedure well and there were no immediate complications.   IMPRESSION:  1. Normal remaining colon after apparent ileocolonic surgery and permanent     colostomy.                                               Barrie Folk, M.D.    JCH/MEDQ  D:  11/09/2001  T:  11/09/2001  Job:  [id number]   cc:   Molly Maduro L. Foy [place], M.D.   Katrina Stack, M.D.

## 2011-02-14 ENCOUNTER — Other Ambulatory Visit (HOSPITAL_COMMUNITY): Payer: Self-pay | Admitting: Family Medicine

## 2011-02-14 DIAGNOSIS — Z139 Encounter for screening, unspecified: Secondary | ICD-10-CM

## 2011-03-04 ENCOUNTER — Ambulatory Visit (HOSPITAL_COMMUNITY)
Admission: RE | Admit: 2011-03-04 | Discharge: 2011-03-04 | Disposition: A | Discharge status: Home / Self Care | Payer: Medicare Other | Source: Ambulatory Visit | Attending: Family Medicine | Admitting: Family Medicine

## 2011-03-04 DIAGNOSIS — Z139 Encounter for screening, unspecified: Secondary | ICD-10-CM

## 2011-03-04 DIAGNOSIS — Z1231 Encounter for screening mammogram for malignant neoplasm of breast: Secondary | ICD-10-CM | POA: Insufficient documentation

## 2011-08-11 ENCOUNTER — Encounter (HOSPITAL_BASED_OUTPATIENT_CLINIC_OR_DEPARTMENT_OTHER): Payer: Self-pay | Admitting: *Deleted

## 2011-08-11 ENCOUNTER — Emergency Department (HOSPITAL_BASED_OUTPATIENT_CLINIC_OR_DEPARTMENT_OTHER)
Admission: EM | Admit: 2011-08-11 | Discharge: 2011-08-11 | Disposition: A | Discharge status: Home / Self Care | Payer: Medicare Other | Attending: Emergency Medicine | Admitting: Emergency Medicine

## 2011-08-11 DIAGNOSIS — R1110 Vomiting, unspecified: Secondary | ICD-10-CM | POA: Insufficient documentation

## 2011-08-11 DIAGNOSIS — R109 Unspecified abdominal pain: Secondary | ICD-10-CM | POA: Insufficient documentation

## 2011-08-11 DIAGNOSIS — R197 Diarrhea, unspecified: Secondary | ICD-10-CM | POA: Insufficient documentation

## 2011-08-11 DIAGNOSIS — D649 Anemia, unspecified: Secondary | ICD-10-CM

## 2011-08-11 DIAGNOSIS — E119 Type 2 diabetes mellitus without complications: Secondary | ICD-10-CM | POA: Insufficient documentation

## 2011-08-11 DIAGNOSIS — N289 Disorder of kidney and ureter, unspecified: Secondary | ICD-10-CM | POA: Insufficient documentation

## 2011-08-11 DIAGNOSIS — Z794 Long term (current) use of insulin: Secondary | ICD-10-CM | POA: Insufficient documentation

## 2011-08-11 DIAGNOSIS — I10 Essential (primary) hypertension: Secondary | ICD-10-CM | POA: Insufficient documentation

## 2011-08-11 HISTORY — DX: Essential (primary) hypertension: I10

## 2011-08-11 HISTORY — DX: Malignant neoplasm of colon, unspecified: C18.9

## 2011-08-11 LAB — COMPREHENSIVE METABOLIC PANEL
BUN: 40 mg/dL — ABNORMAL HIGH (ref 6–23)
CO2: 25 mEq/L (ref 19–32)
Calcium: 9 mg/dL (ref 8.4–10.5)
Chloride: 100 mEq/L (ref 96–112)
Creatinine, Ser: 2.4 mg/dL — ABNORMAL HIGH (ref 0.50–1.10)
GFR calc Af Amer: 20 mL/min — ABNORMAL LOW (ref >90)
GFR calc non Af Amer: 18 mL/min — ABNORMAL LOW (ref >90)
Total Bilirubin: 0.3 mg/dL (ref 0.3–1.2)

## 2011-08-11 LAB — CBC
HCT: 33.9 % — ABNORMAL LOW (ref 36.0–46.0)
MCH: 31.1 pg (ref 26.0–34.0)
MCV: 93.4 fL (ref 78.0–100.0)
Platelets: 148 10*3/uL — ABNORMAL LOW (ref 150–400)
RBC: 3.63 MIL/uL — ABNORMAL LOW (ref 3.87–5.11)
WBC: 8.6 10*3/uL (ref 4.0–10.5)

## 2011-08-11 LAB — LIPASE, BLOOD: Lipase: 30 U/L (ref 11–59)

## 2011-08-11 LAB — DIFFERENTIAL
Basophils Absolute: 0 10*3/uL (ref 0.0–0.1)
Basophils Relative: 0 % (ref 0–1)
Eosinophils Absolute: 0 10*3/uL (ref 0.0–0.7)
Eosinophils Relative: 0 % (ref 0–5)
Lymphs Abs: 1.5 10*3/uL (ref 0.7–4.0)
Neutrophils Relative %: 74 % (ref 43–77)

## 2011-08-11 LAB — URINALYSIS, ROUTINE W REFLEX MICROSCOPIC
Bilirubin Urine: NEGATIVE
Glucose, UA: NEGATIVE mg/dL
Hgb urine dipstick: NEGATIVE
Protein, ur: NEGATIVE mg/dL
Specific Gravity, Urine: 1.01 (ref 1.005–1.030)
Urobilinogen, UA: 0.2 mg/dL (ref 0.0–1.0)

## 2011-08-11 LAB — TROPONIN I: Troponin I: <0.3 ng/mL (ref <0.30)

## 2011-08-11 MED ORDER — SODIUM CHLORIDE 0.9 % IV BOLUS (SEPSIS)
1000.0000 mL | Freq: Once | INTRAVENOUS | Status: AC
  Administered 2011-08-11: 1000 mL via INTRAVENOUS

## 2011-08-11 MED ORDER — LOPERAMIDE HCL 2 MG PO CAPS: ORAL_CAPSULE | ORAL | Status: AC

## 2011-08-11 MED ORDER — SODIUM CHLORIDE 0.9 % IV SOLN
INTRAVENOUS | Status: DC
  Administered 2011-08-11: 16:00:00 via INTRAVENOUS

## 2011-08-11 MED ORDER — SODIUM CHLORIDE 0.9 % IV BOLUS (SEPSIS): 1000.0000 mL | Freq: Once | INTRAVENOUS | Status: DC

## 2011-08-11 MED ORDER — LOPERAMIDE HCL 2 MG PO CAPS
4.0000 mg | ORAL_CAPSULE | Freq: Once | ORAL | Status: AC
  Administered 2011-08-11: 4 mg via ORAL
  Filled 2011-08-11: qty 2

## 2011-08-11 NOTE — ED Provider Notes (Signed)
History     CSN: [id number]  Arrival date & time 08/11/11  1320   First MD Initiated Contact with Patient 08/11/11 1441      Chief Complaint  Patient presents with  . Diarrhea  . Emesis    (Consider location/radiation/quality/duration/timing/severity/associated sxs/prior treatment) HPI This 78 year old female has about a one-week history of multiple nonbloody loose stools per day, yesterday evening and night before that she had a small amount of nausea and nonbloody vomiting, she is no nausea or vomiting today, she has had some intermittent diffuse crampy abdominal pains which are not constant or localized or severe over the last week and are minimally present now. She has no chest pain cough shortness breath fever rash. She has no localized or lateralizing weakness or numbness or change in speech or vision or swallowing. She does feel generally weak and dehydrated. Past Medical History  Diagnosis Date  . Colon cancer   . Diabetes mellitus   . Hypertension   . Renal insufficiency, mild   . Anxiety   anemia  Past Surgical History  Procedure Date  . Abdominal surgery   . Abdominal hysterectomy   . Colon surgery   . Appendectomy   . Colostomy     History reviewed. No pertinent family history.  History  Substance Use Topics  . Smoking status: Former Games developer  . Smokeless tobacco: Not on file  . Alcohol Use: No    OB History    Grav Para Term Preterm Abortions TAB SAB Ect Mult Living                  Review of Systems  Constitutional: Negative for fever.       10 Systems reviewed and are negative for acute change except as noted in the HPI.  HENT: Negative for congestion.   Eyes: Negative for discharge and redness.  Respiratory: Negative for cough and shortness of breath.   Cardiovascular: Negative for chest pain.  Gastrointestinal: Positive for nausea, vomiting, abdominal pain and diarrhea.  Genitourinary: Negative for dysuria.  Musculoskeletal: Negative for back  pain.  Skin: Negative for rash.  Neurological: Positive for weakness. Negative for syncope, numbness and headaches.  Psychiatric/Behavioral:       No behavior change.    Allergies  Review of patient's allergies indicates no known allergies.  Home Medications   Current Outpatient Rx  Name Route Sig Dispense Refill  . ALPRAZOLAM 0.5 MG PO TABS Oral Take 0.5 mg by mouth at bedtime as needed.    Marland Kitchen AMITRIPTYLINE HCL 50 MG PO TABS Oral Take 50 mg by mouth at bedtime.    Marland Kitchen AMLODIPINE BESYLATE 10 MG PO TABS Oral Take 10 mg by mouth daily.    Marland Kitchen CARVEDILOL 6.25 MG PO TABS Oral Take 6.25 mg by mouth 2 (two) times daily with a meal.    . STOOL SOFTENER PO Oral Take 1 capsule by mouth daily as needed.    Marland Kitchen HEMATINIC PLUS COMPLEX PO Oral Take 1 tablet by mouth daily.    . FUROSEMIDE 40 MG PO TABS Oral Take 40 mg by mouth 2 (two) times daily.    . INSULIN GLARGINE 100 UNIT/ML [place] SOLN Subcutaneous Inject 8 Units into the skin at bedtime.    . INSULIN PEN NEEDLE 31G X 5 MM MISC Does not apply by Does not apply route.    Marland Kitchen SITAGLIPTIN PHOSPHATE 25 MG PO TABS Oral Take 25 mg by mouth daily.    Marland Kitchen VITAMIN B-12 1000 MCG  PO TABS Oral Take 1,000 mcg by mouth daily.    Marland Kitchen LOPERAMIDE HCL 2 MG PO CAPS  Take two tabs po initially, then one tab after each loose stool: max 8 tabs in 24 hours 12 capsule 0    BP 127/49  Pulse 67  Temp(Src) 98.2 F (36.8 C) (Oral)  Resp 16  Ht 5\' 3"  (1.6 m)  Wt 197 lb (89.359 kg)  BMI 34.90 kg/m2  SpO2 97%  Physical Exam  Nursing note and vitals reviewed. Constitutional:       Awake, alert, nontoxic appearance.  HENT:  Head: Atraumatic.       Dry oral mucosa  Eyes: Right eye exhibits no discharge. Left eye exhibits no discharge.  Neck: Neck supple.  Cardiovascular: Normal rate and regular rhythm.   No murmur heard. Pulmonary/Chest: Effort normal and breath sounds normal. No respiratory distress. She has no wheezes. She has no rales. She exhibits no tenderness.    Abdominal: Soft. Bowel sounds are normal. She exhibits no mass. There is tenderness. There is no rebound and no guarding.       Minimal diffuse tenderness without rebound; colostomy bag is currently empty the patient is changed several times today from nonbloody loose stools already  Musculoskeletal: She exhibits no edema and no tenderness.       Baseline ROM, no obvious new focal weakness.  Neurological:       Mental status and motor strength appears baseline for patient and situation.  Skin: No rash noted.  Psychiatric: She has a normal mood and affect.    ED Course  Procedures (including critical care time)  Labs Reviewed  CBC - Abnormal; Notable for the following:    RBC 3.63 (*)    Hemoglobin 11.3 (*)    HCT 33.9 (*)    Platelets 148 (*)    All other components within normal limits  COMPREHENSIVE METABOLIC PANEL - Abnormal; Notable for the following:    Glucose, Bld 103 (*)    BUN 40 (*)    Creatinine, Ser 2.40 (*)    Albumin 3.3 (*)    Alkaline Phosphatase 127 (*)    GFR calc non Af Amer 18 (*)    GFR calc Af Amer 20 (*)    All other components within normal limits  URINALYSIS, ROUTINE W REFLEX MICROSCOPIC  TROPONIN I  LIPASE, BLOOD  DIFFERENTIAL   No results found.   1. Diarrhea   2. Renal insufficiency   3. Anemia       MDM  Pt stable in ED with no significant deterioration in condition.Patient / Family / Caregiver informed of clinical course, understand medical decision-making process, and agree with plan.I doubt any other EMC precluding discharge at this time including, but not necessarily limited to the following:SBI.        Hurman Horn, MD 08/18/11 1754

## 2011-08-11 NOTE — Discharge Instructions (Signed)

## 2011-08-11 NOTE — ED Notes (Signed)
MD at bedside. 

## 2011-08-11 NOTE — ED Notes (Signed)
Pt c/o generalized abd pain , loose stools and vomiting x x 6 days

## 2011-08-27 ENCOUNTER — Emergency Department (HOSPITAL_COMMUNITY)
Admission: EM | Admit: 2011-08-27 | Discharge: 2011-08-27 | Disposition: A | Discharge status: Home / Self Care | Payer: Medicare Other | Attending: Emergency Medicine | Admitting: Emergency Medicine

## 2011-08-27 ENCOUNTER — Emergency Department (HOSPITAL_COMMUNITY): Payer: Medicare Other

## 2011-08-27 ENCOUNTER — Encounter (HOSPITAL_COMMUNITY): Payer: Self-pay

## 2011-08-27 DIAGNOSIS — R531 Weakness: Secondary | ICD-10-CM

## 2011-08-27 DIAGNOSIS — R5381 Other malaise: Secondary | ICD-10-CM | POA: Insufficient documentation

## 2011-08-27 DIAGNOSIS — J984 Other disorders of lung: Secondary | ICD-10-CM | POA: Insufficient documentation

## 2011-08-27 DIAGNOSIS — R5383 Other fatigue: Secondary | ICD-10-CM | POA: Insufficient documentation

## 2011-08-27 DIAGNOSIS — R509 Fever, unspecified: Secondary | ICD-10-CM | POA: Insufficient documentation

## 2011-08-27 DIAGNOSIS — R0989 Other specified symptoms and signs involving the circulatory and respiratory systems: Secondary | ICD-10-CM | POA: Insufficient documentation

## 2011-08-27 DIAGNOSIS — E119 Type 2 diabetes mellitus without complications: Secondary | ICD-10-CM | POA: Insufficient documentation

## 2011-08-27 DIAGNOSIS — J811 Chronic pulmonary edema: Secondary | ICD-10-CM | POA: Insufficient documentation

## 2011-08-27 DIAGNOSIS — I4891 Unspecified atrial fibrillation: Secondary | ICD-10-CM | POA: Insufficient documentation

## 2011-08-27 DIAGNOSIS — I10 Essential (primary) hypertension: Secondary | ICD-10-CM | POA: Insufficient documentation

## 2011-08-27 DIAGNOSIS — Z933 Colostomy status: Secondary | ICD-10-CM | POA: Insufficient documentation

## 2011-08-27 DIAGNOSIS — R609 Edema, unspecified: Secondary | ICD-10-CM | POA: Insufficient documentation

## 2011-08-27 DIAGNOSIS — I509 Heart failure, unspecified: Secondary | ICD-10-CM | POA: Insufficient documentation

## 2011-08-27 DIAGNOSIS — Z794 Long term (current) use of insulin: Secondary | ICD-10-CM | POA: Insufficient documentation

## 2011-08-27 LAB — DIFFERENTIAL
Basophils Relative: 0 % (ref 0–1)
Eosinophils Absolute: 0 10*3/uL (ref 0.0–0.7)
Eosinophils Relative: 0 % (ref 0–5)
Neutrophils Relative %: 76 % (ref 43–77)

## 2011-08-27 LAB — COMPREHENSIVE METABOLIC PANEL
ALT: 8 U/L (ref 0–35)
AST: 17 U/L (ref 0–37)
Alkaline Phosphatase: 125 U/L — ABNORMAL HIGH (ref 39–117)
CO2: 25 mEq/L (ref 19–32)
Chloride: 103 mEq/L (ref 96–112)
Creatinine, Ser: 1.48 mg/dL — ABNORMAL HIGH (ref 0.50–1.10)
GFR calc non Af Amer: 32 mL/min — ABNORMAL LOW (ref >90)
Potassium: 4.3 mEq/L (ref 3.5–5.1)
Sodium: 139 mEq/L (ref 135–145)
Total Bilirubin: 0.4 mg/dL (ref 0.3–1.2)

## 2011-08-27 LAB — CBC
MCH: 30.4 pg (ref 26.0–34.0)
MCHC: 32.4 g/dL (ref 30.0–36.0)
Platelets: 189 10*3/uL (ref 150–400)
RDW: 14.2 % (ref 11.5–15.5)

## 2011-08-27 LAB — URINALYSIS, ROUTINE W REFLEX MICROSCOPIC
Glucose, UA: NEGATIVE mg/dL
Ketones, ur: NEGATIVE mg/dL
Leukocytes, UA: NEGATIVE
pH: 6.5 (ref 5.0–8.0)

## 2011-08-27 LAB — LACTIC ACID, PLASMA: Lactic Acid, Venous: 1.3 mmol/L (ref 0.5–2.2)

## 2011-08-27 LAB — PRO B NATRIURETIC PEPTIDE: Pro B Natriuretic peptide (BNP): 4980 pg/mL — ABNORMAL HIGH (ref 0–450)

## 2011-08-27 MED ORDER — ONDANSETRON 4 MG PO TBDP: ORAL_TABLET | ORAL | Status: AC

## 2011-08-27 MED ORDER — ONDANSETRON 4 MG PO TBDP
4.0000 mg | ORAL_TABLET | Freq: Once | ORAL | Status: AC
  Administered 2011-08-27: 4 mg via ORAL
  Filled 2011-08-27: qty 1

## 2011-08-27 NOTE — ED Notes (Signed)
Family unhappy w/ pt being d/c'd home - states pt has been having HA and Abd pain, are concerned about pt going home - attempted to inquire about family concerns and pt's daughter states "I will just put it in writing, its fine, whatever" - encouraged family to discuss goal for pt's care plan and family reluctant to express needs. On assessment pt denies abd pain or nausea at present and states "my headache has eased off." Discussed pt's family concerns w/ primary nurse and Dr. Manus Gunning. Dr. Manus Gunning to see pt however pt assisted to ambulate out of facility by daughter prior to Dr. Manus Gunning arrival at bedside.

## 2011-08-27 NOTE — ED Notes (Signed)
Pt awaiting MD.

## 2011-08-27 NOTE — Discharge Instructions (Signed)

## 2011-08-27 NOTE — ED Notes (Signed)
Pt. Having nausea, weakness and headache

## 2011-08-27 NOTE — ED Notes (Signed)
Kim RN went in patient room to discharge patient and patient and family expressed unhappiness with discharge plan, Selena Batten came to me to discuss patients feelings and we went and spoke with MD Rancour. Told MD that patient had further concerns and was unhappy with discharge plan, he stated he would be to patient bedside soon. Upon entering room to tell patient that MD would be back to bedside for further questions, the patient and her daughter walked out, explained to them that the MD was coming to speak to them to address their concerns and they stated they were leaving, offered patient wheelchair and they refused. Two of the patients daughters remained in the patients room and it was again explained to them that we were trying to address their concerns and apologized that they were unhappy with the discharge decision.

## 2011-08-27 NOTE — ED Provider Notes (Signed)
History     CSN: [id number]  Arrival date & time 08/27/11  1805   First MD Initiated Contact with Patient 08/27/11 1846      Chief Complaint  Patient presents with  . Weakness    (Consider location/radiation/quality/duration/timing/severity/associated sxs/prior treatment) Patient is a 78 y.o. female presenting with weakness. The history is provided by the patient.  Weakness Primary symptoms do not include headaches, dizziness, fever, nausea or vomiting. Primary symptoms comment: weakness The symptoms began 2 days ago. The symptoms are unchanged. The neurological symptoms are diffuse. Context: spontaneously.  Additional symptoms include weakness.    Past Medical History  Diagnosis Date  . Colon cancer   . Diabetes mellitus   . Hypertension   . Renal insufficiency, mild   . Anxiety     Past Surgical History  Procedure Date  . Abdominal surgery   . Abdominal hysterectomy   . Colon surgery   . Appendectomy   . Colostomy     No family history on file.  History  Substance Use Topics  . Smoking status: Former Games developer  . Smokeless tobacco: Not on file  . Alcohol Use: No    OB History    Grav Para Term Preterm Abortions TAB SAB Ect Mult Living                  Review of Systems  Constitutional: Negative for fever and fatigue.  HENT: Negative for congestion, drooling and neck pain.   Eyes: Negative for pain.  Respiratory: Negative for cough and shortness of breath.   Cardiovascular: Negative for chest pain.  Gastrointestinal: Negative for nausea, vomiting, abdominal pain and diarrhea.  Genitourinary: Negative for dysuria and hematuria.  Musculoskeletal: Negative for back pain and gait problem.  Skin: Negative for color change.  Neurological: Positive for weakness. Negative for dizziness and headaches.  Hematological: Negative for adenopathy.  Psychiatric/Behavioral: Negative for behavioral problems.  All other systems reviewed and are negative.    Allergies    Review of patient's allergies indicates no known allergies.  Home Medications   Current Outpatient Rx  Name Route Sig Dispense Refill  . ALPRAZOLAM 0.5 MG PO TABS Oral Take 0.5 mg by mouth 2 (two) times daily as needed.     Marland Kitchen AMITRIPTYLINE HCL 50 MG PO TABS Oral Take 50 mg by mouth at bedtime.    Marland Kitchen AMLODIPINE BESYLATE 10 MG PO TABS Oral Take 10 mg by mouth daily.    . ASPIRIN 81 MG PO TABS Oral Take 81 mg by mouth daily.    Marland Kitchen CARVEDILOL 6.25 MG PO TABS Oral Take 6.25 mg by mouth 2 (two) times daily with a meal.    . STOOL SOFTENER PO Oral Take 1 capsule by mouth daily as needed.    . FUROSEMIDE 40 MG PO TABS Oral Take 40 mg by mouth daily.     . INSULIN GLARGINE 100 UNIT/ML [place] SOLN Subcutaneous Inject 8 Units into the skin at bedtime.    Marland Kitchen METRONIDAZOLE 500 MG PO TABS Oral Take 500 mg by mouth 3 (three) times daily. Take for 3 days. First dose on 08/24/11    . SITAGLIPTIN PHOSPHATE 25 MG PO TABS Oral Take 25 mg by mouth daily.    Marland Kitchen VITAMIN B-12 1000 MCG PO TABS Oral Take 1,000 mcg by mouth daily.    Marland Kitchen CIPROFLOXACIN HCL 500 MG PO TABS Oral Take 500 mg by mouth daily. Take for 3 days    . INSULIN PEN NEEDLE 31G X  5 MM MISC Does not apply by Does not apply route.    Marland Kitchen ONDANSETRON 4 MG PO TBDP  4mg  ODT q4 hours prn nausea/vomit 10 tablet 0    BP 144/95  Pulse 106  Temp(Src) 98.4 F (36.9 C) (Oral)  Resp 16  SpO2 100%  Physical Exam  Nursing note and vitals reviewed. Constitutional: She is oriented to person, place, and time. She appears well-developed and well-nourished.  HENT:  Head: Normocephalic.  Mouth/Throat: No oropharyngeal exudate.  Eyes: Conjunctivae and EOM are normal. Pupils are equal, round, and reactive to light.  Neck: Normal range of motion. Neck supple.  Cardiovascular: Normal rate, regular rhythm, normal heart sounds and intact distal pulses.  Exam reveals no gallop and no friction rub.   No murmur heard. Pulmonary/Chest: Effort normal and breath sounds normal. No  respiratory distress. She has no wheezes.  Abdominal: Soft. Bowel sounds are normal. There is no tenderness. There is no rebound and no guarding.       Ostomy site appears pink, clean.  Musculoskeletal: Normal range of motion. She exhibits edema (mild to mod pitting edema in bilat LE's extending to knee). She exhibits no tenderness.  Neurological: She is alert and oriented to person, place, and time. She has normal strength. No sensory deficit.  Skin: Skin is warm and dry.  Psychiatric: She has a normal mood and affect. Her behavior is normal.    ED Course  Procedures (including critical care time)  Labs Reviewed  URINALYSIS, ROUTINE W REFLEX MICROSCOPIC - Abnormal; Notable for the following:    Hgb urine dipstick TRACE (*)    Protein, ur 30 (*)    All other components within normal limits  COMPREHENSIVE METABOLIC PANEL - Abnormal; Notable for the following:    Glucose, Bld 145 (*)    Creatinine, Ser 1.48 (*)    Alkaline Phosphatase 125 (*)    GFR calc non Af Amer 32 (*)    GFR calc Af Amer 37 (*)    All other components within normal limits  PRO B NATRIURETIC PEPTIDE - Abnormal; Notable for the following:    Pro B Natriuretic peptide (BNP) 4980.0 (*)    All other components within normal limits  GLUCOSE, CAPILLARY - Abnormal; Notable for the following:    Glucose-Capillary 143 (*)    All other components within normal limits  CBC  DIFFERENTIAL  LACTIC ACID, PLASMA  URINE MICROSCOPIC-ADD ON   Dg Chest Port 1 View  08/27/2011  *RADIOLOGY REPORT*  Clinical Data: Fever and hypertension.  PORTABLE CHEST - 1 VIEW  Comparison: CT chest 09/16/2006.  Findings: The heart is mildly enlarged.  Pulmonary vascular congestion is noted.  Mild bibasilar airspace disease likely reflects atelectasis.  Degenerative changes are evident in the shoulders bilaterally, worse on the left.  IMPRESSION:  1.  Mild cardiomegaly and pulmonary vascular congestion. 2.  Mild bibasilar airspace disease likely  reflects atelectasis.  Original Report Authenticated By: Jamesetta Orleans. MATTERN, M.D.     1. Weakness generalized      Date: 08/27/2011  Rate: 109  Rhythm: atrial fibrillation  QRS Axis: normal  Intervals: indeterminate  ST/T Wave abnormalities: normal  Conduction Disutrbances:PVC, otherwise none  Narrative Interpretation: No new ST or T wave changes cw ischemia  Old EKG Reviewed: changes noted    MDM  9:38 PM 78 y.o. female pw diffuse weakness since d/c from [facility] hospital 3 days ago. Pt notes mild nausea, diarrhea, and soreness to stomach. Pt AFVSS here, appears well  on exam, abd soft and benign. Will get screening labs.    9:38 PM: Pt continues to appear well on exam. Reviewed [place] d/c summary and imaging which is non-contrib as pt has no focal abd pain. Pt noted to be in a-fib at [facility] w/ rec to f/u with cardiology. Pt has elev bnp and mild congestion on CXR, but stable on RA. Will strongly rec cardiology f/u.  I have discussed the diagnosis/risks/treatment options with the patient and family and believe the pt to be eligible for discharge home to follow-up with pcp and cardiology in next 2-3 days. We also discussed returning to the ED immediately if new or worsening sx occur. We discussed the sx which are most concerning (e.g., sob, cp, worsening LE swelling) that necessitate immediate return. Family upset about d/c and want to find out what is wrong with the pt. The attending was informed by the nursing staff, but the pt and family had already left before the attending could further discuss the reason for d/c. Any new prescriptions provided to the patient are listed below.  New Prescriptions   ONDANSETRON (ZOFRAN ODT) 4 MG DISINTEGRATING TABLET    4mg  ODT q4 hours prn nausea/vomit   Clinical Impression 1. Weakness generalized         Purvis Sheffield, MD 08/28/11 0127

## 2011-08-28 NOTE — ED Provider Notes (Signed)
I saw and evaluated the patient, reviewed the resident's note and I agree with the findings and plan.  Generalized weakness x 2 days after d/c from [facility] x 3 days ago. Admitted with n/v/d. Found to have gallbladder dysfunction without choleycystitis. No specific symptoms today. No focal weakness, chest pain, SOB, fever, headache. Hx afib. Alert, nontoxic, well appearing. 5/5 strength throughout. Lungs clear.  Abdomen soft and nontender, ostomy pink  Informed by nursing that family disagrees with discharge decision.  Before I could discuss concerns with family, patient left without assistance.  Glynn Octave, MD 08/28/11 1301

## 2011-09-23 ENCOUNTER — Encounter (HOSPITAL_COMMUNITY): Payer: Self-pay | Admitting: Pharmacy Technician

## 2011-09-26 ENCOUNTER — Encounter: Payer: Self-pay | Admitting: Cardiology

## 2011-09-26 ENCOUNTER — Other Ambulatory Visit: Payer: Self-pay | Admitting: Cardiology

## 2011-09-26 NOTE — H&P (Signed)
Progress Notes     Patient: [name], [name] Provider: Armanda Magic, MD  DOB: 1925/04/23 Age: 78 Y Sex: Female Date: 09/12/2011  Phone: [phone]   Address: [address] [place], [place], [id number]  Pcp: ROBERT FRIED       Subjective:     CC:    1. REFERRED DR Molly Maduro FRIED EVALUATE NEW ONSET AFIB.        HPI:  General:  The patient presents today for evaluation of new onset atrial fibrillation. She recently went to [facility] due to diarrhea and was diagnosed with an inflamed gallbladder. She was apparently in atrial fibrillation at the time. She denies any palpitations, dizziness, chest pain or pressure or SOB. She ended up back in the hospital due to dehydration and nausea but also was noted to have increased LE edema with an elevated pro-BNP. She says that she is feeling better. SHe was placed on Eliquis for blood thinning..        ROS:  See HPI, A twelve system review was perfomed at today's visit. For pertinent positives and negatives see HPI.       Medical History: Colon Cancer, surgery with colostomy, Diabetic Retinopathy, Anemia, Diabetes mellitus, Hypertension, benign.        Gyn History:        OB History:        Surgical History: Hysterectomy 1982, colostomy 1982, Revision of colostomy 1990, Appendectomy , Colonoscopy- 5 year repeat V10.05 10/11/2004, EGD 09/2004, Colonoscopy- 5 year repeat V10.05 05/13/2010.        Hospitalization/Major Diagnostic Procedure: not in the past year 2011.        Family History: Father: deceased 68 yrs heart attack, stroke Mother: deceased 81 yrs Paternal Grand Father: deceased Paternal Grand Mother: deceased Maternal Grand Father: deceased Maternal Grand Mother: deceased Sister 1: deceased 53 yrs Sister 2: alive emphysema Maternal aunt: colon carcinoma  denies family history of liver disease.       Social History:  General:  History of smoking cigarettes: Never smoked.  no Smoking, Dips snuff.  Alcohol: none.    Caffeine: yes, 2+ servings daily.  Recreational drug use: no.  Exercise: 1-2 times per week.  Occupation: House wife.  Education: 10 th grade.  Marital Status: widowed.  Children: 4, 3.  Religion: Baptist.  Firearms: no.  Seat belt use: yes.        Medications: Amlodipine Besylate 10 MG Tablet 1 tablet once a day, BD U/F Mini Pen Needle 31G X 5 MM Miscellaneous USE AS DIRECTED , Carvedilol 6.25 MG Tablet 2 tablets Twice a day, Januvia 25 MG Tablet 1 tablet Once a day, Lantus SoloStar 100 UNIT/ML Solution 8 units Once a day in the evening, Vitamin B12 1000 MCG Tablet 1 tablet once a day in the morning, Stool Softener 100 MG Capsule 1 capsule as needed as needed, Alprazolam 0.5 MG Tablet 1 tablet twice a day, Amitriptyline HCl 50 MG Tablet 1 tablet at bedtime Once a day, Eliquis 2.5mg  . Tablet 1 tablet twice a day, Furosemide 40 MG Tablet 1 tablet twice a day, Medication List reviewed and reconciled with the patient       Allergies: N.K.D.A.      Objective:     Vitals: Wt 187.6, Wt change -5.4 lb, Ht 63, BMI 33.23, Pulse sitting 80 irregular, BP sitting 128/86.       Examination:  Cardiology, General:  GENERAL APPEARANCE: pleasant, NAD.  HEENT: unremarkable.  CAROTID UPSTROKE: normal, no bruit.  JVD: flat.  HEART SOUNDS: normal S1, S2, no S3 or S4, irregularly irregular.  MURMUR: 2/6, SEM best heard at the base with radiation to LLSB.  LUNGS: no rales or wheezes.  ABDOMEN: soft, non tender, positive bowel sounds, no masses felt.  EXTREMITIES: no leg edema.  PERIPHERAL PULSES: 2 plus bilateral.        Assessment:     Assessment:  1. Atrial fibrillation - 427.31 (Primary)  2. Essential hypertension, benign - 401.1  3. Diastolic CHF - 428.30    Plan:     1. Atrial fibrillation Continue Carvedilol Tablet, 6.25 MG, 2 tablets, Orally, Twice a day ; Continue Eliquis 2.5mg  Tablet, ., 1 tablet, orally, twice a day .  Diagnostic Imaging:EC Echocardiogram (Ordered for  09/19/2011) normal LVF, mod LVH, mildly dilated aorta, mild MAC, calcfied AMV leaflet, trivial MR, mod AVSC, mod LAE, mild TR, mild AS, grade II diastolic dysfunction, Arsenio Schnorr M 09/20/2011 09:15:08 PM > please let patient konw that echo showed normal LVF with moderately thickened and stiff heart muscle, moderately thickened and calcified AV with mild AS, mod enlargement of LA, mildly leaky TV/MV and mildly calcified MV Plummer,Wanda 09/23/2011 03:04:27 PM > Pt. notified. Forwarding to Dr. Foy [place]. FRIED,ROBERT 09/24/2011 06:56:18 AM >noted  She has been on blood thinners for 2 weeks so I will wait another 2-3 weeks and schedule DCCV.       2. Essential hypertension, benign Continue Amlodipine Besylate Tablet, 10 MG, 1 tablet, Orally, once a day ; Continue Carvedilol Tablet, 6.25 MG, 2 tablets, Orally, Twice a day .  Diagnostic Imaging:EKG atrial fibrillation, Plummer,Wanda 09/12/2011 10:28:31 AM > Marionette Meskill M 09/12/2011 10:40:55 AM >       3. Diastolic CHF Continue Carvedilol Tablet, 6.25 MG, 2 tablets, Orally, Twice a day ; Continue Furosemide Tablet, 40 MG, 1 tablet, Orally, twice a day .        Immunizations:        Labs:        Procedure Codes: 16109 EKG I AND R       Preventive:         Follow Up: cardioversion      Provider: Armanda Magic, MD  Patient: [name], [name] DOB: 1925-09-01 Date: 09/12/2011

## 2011-09-26 NOTE — Addendum Note (Signed)
Addended by: Armanda Magic on: 09/26/2011 03:27 PM   Modules accepted: Orders

## 2011-09-30 ENCOUNTER — Ambulatory Visit (HOSPITAL_COMMUNITY): Payer: Medicare Other

## 2011-09-30 ENCOUNTER — Encounter (HOSPITAL_COMMUNITY): Payer: Self-pay | Admitting: Cardiology

## 2011-09-30 ENCOUNTER — Encounter (HOSPITAL_COMMUNITY): Payer: Self-pay

## 2011-09-30 ENCOUNTER — Encounter (HOSPITAL_COMMUNITY): Payer: Self-pay | Admitting: Anesthesiology

## 2011-09-30 ENCOUNTER — Encounter (HOSPITAL_COMMUNITY)
Admission: RE | Disposition: A | Discharge status: Home / Self Care | Payer: Self-pay | Source: Ambulatory Visit | Attending: Cardiology

## 2011-09-30 ENCOUNTER — Ambulatory Visit (HOSPITAL_COMMUNITY)
Admission: RE | Admit: 2011-09-30 | Discharge: 2011-09-30 | Disposition: A | Discharge status: Home / Self Care | Payer: Medicare Other | Source: Ambulatory Visit | Attending: Cardiology | Admitting: Cardiology

## 2011-09-30 DIAGNOSIS — I4891 Unspecified atrial fibrillation: Secondary | ICD-10-CM | POA: Insufficient documentation

## 2011-09-30 DIAGNOSIS — I5030 Unspecified diastolic (congestive) heart failure: Secondary | ICD-10-CM | POA: Insufficient documentation

## 2011-09-30 DIAGNOSIS — E11319 Type 2 diabetes mellitus with unspecified diabetic retinopathy without macular edema: Secondary | ICD-10-CM | POA: Insufficient documentation

## 2011-09-30 DIAGNOSIS — I509 Heart failure, unspecified: Secondary | ICD-10-CM | POA: Insufficient documentation

## 2011-09-30 DIAGNOSIS — E1139 Type 2 diabetes mellitus with other diabetic ophthalmic complication: Secondary | ICD-10-CM | POA: Insufficient documentation

## 2011-09-30 HISTORY — PX: CARDIOVERSION: SHX1299

## 2011-09-30 LAB — BASIC METABOLIC PANEL
BUN: 35 mg/dL — ABNORMAL HIGH (ref 6–23)
Calcium: 9 mg/dL (ref 8.4–10.5)
Chloride: 104 mEq/L (ref 96–112)
Creatinine, Ser: 2.05 mg/dL — ABNORMAL HIGH (ref 0.50–1.10)
GFR calc Af Amer: 25 mL/min — ABNORMAL LOW (ref >90)

## 2011-09-30 LAB — CBC
HCT: 34.7 % — ABNORMAL LOW (ref 36.0–46.0)
Hemoglobin: 11.3 g/dL — ABNORMAL LOW (ref 12.0–15.0)
MCH: 30.1 pg (ref 26.0–34.0)
MCHC: 32.6 g/dL (ref 30.0–36.0)
MCV: 92.5 fL (ref 78.0–100.0)
Platelets: 141 10*3/uL — ABNORMAL LOW (ref 150–400)
RBC: 3.75 MIL/uL — ABNORMAL LOW (ref 3.87–5.11)
RDW: 13.3 % (ref 11.5–15.5)
WBC: 8.5 10*3/uL (ref 4.0–10.5)

## 2011-09-30 SURGERY — CARDIOVERSION
Anesthesia: General | Wound class: Clean

## 2011-09-30 MED ORDER — SODIUM CHLORIDE 0.9 % IV SOLN
INTRAVENOUS | Status: DC | PRN
  Administered 2011-09-30: 14:00:00 via INTRAVENOUS

## 2011-09-30 MED ORDER — FUROSEMIDE 40 MG PO TABS: 40.0000 mg | ORAL_TABLET | Freq: Every day | ORAL | Status: DC

## 2011-09-30 MED ORDER — PROPOFOL 10 MG/ML IV EMUL
INTRAVENOUS | Status: DC | PRN
  Administered 2011-09-30: 100 mg via INTRAVENOUS

## 2011-09-30 NOTE — Anesthesia Postprocedure Evaluation (Signed)
  Anesthesia Post-op Note  Patient: [name]  Procedure(s) Performed: Procedure(s) (LRB): CARDIOVERSION (N/A)  Patient Location: Short Stay  Anesthesia Type: General  Level of Consciousness: awake and oriented  Airway and Oxygen Therapy: Patient Spontanous Breathing and Patient connected to nasal cannula oxygen  Post-op Pain: none  Post-op Assessment: Post-op Vital signs reviewed, Patient's Cardiovascular Status Stable, Respiratory Function Stable and Patent Airway  Post-op Vital Signs: Reviewed and stable  Complications: No apparent anesthesia complications

## 2011-09-30 NOTE — H&P (View-Only) (Signed)
Progress Notes     Patient: [name] Provider: Traci Turner, MD  DOB: 10/05/1925 Age: 78 Y Sex: Female Date: 09/12/2011  Phone: [phone]   Address: [address], [place], [place]  Pcp: ROBERT FRIED       Subjective:     CC:    1. REFERRED DR ROBERT FRIED EVALUATE NEW ONSET AFIB.        HPI:  General:  The patient presents today for evaluation of new onset atrial fibrillation. She recently went to  med center due to diarrhea and was diagnosed with an inflamed gallbladder. She was apparently in atrial fibrillation at the time. She denies any palpitations, dizziness, chest pain or pressure or SOB. She ended up back in the hospital due to dehydration and nausea but also was noted to have increased LE edema with an elevated pro-BNP. She says that she is feeling better. SHe was placed on Eliquis for blood thinning..        ROS:  See HPI, A twelve system review was perfomed at today's visit. For pertinent positives and negatives see HPI.       Medical History: Colon Cancer, surgery with colostomy, Diabetic Retinopathy, Anemia, Diabetes mellitus, Hypertension, benign.        Gyn History:        OB History:        Surgical History: Hysterectomy 1982, colostomy 1982, Revision of colostomy 1990, Appendectomy , Colonoscopy- 5 year repeat V10.05 10/11/2004, EGD 09/2004, Colonoscopy- 5 year repeat V10.05 05/13/2010.        Hospitalization/Major Diagnostic Procedure: not in the past year 2011.        Family History: Father: deceased 77 yrs heart attack, stroke Mother: deceased 88 yrs Paternal Grand Father: deceased Paternal Grand Mother: deceased Maternal Grand Father: deceased Maternal Grand Mother: deceased Sister 1: deceased 47 yrs Sister 2: alive emphysema Maternal aunt: colon carcinoma  denies family history of liver disease.       Social History:  General:  History of smoking cigarettes: Never smoked.  no Smoking, Dips snuff.  Alcohol: none.    Caffeine: yes, 2+ servings daily.  Recreational drug use: no.  Exercise: 1-2 times per week.  Occupation: House wife.  Education: 10 th grade.  Marital Status: widowed.  Children: 4, 3.  Religion: Baptist.  Firearms: no.  Seat belt use: yes.        Medications: Amlodipine Besylate 10 MG Tablet 1 tablet once a day, BD U/F Mini Pen Needle 31G X 5 MM Miscellaneous USE AS DIRECTED , Carvedilol 6.25 MG Tablet 2 tablets Twice a day, Januvia 25 MG Tablet 1 tablet Once a day, Lantus SoloStar 100 UNIT/ML Solution 8 units Once a day in the evening, Vitamin B12 1000 MCG Tablet 1 tablet once a day in the morning, Stool Softener 100 MG Capsule 1 capsule as needed as needed, Alprazolam 0.5 MG Tablet 1 tablet twice a day, Amitriptyline HCl 50 MG Tablet 1 tablet at bedtime Once a day, Eliquis 2.5mg . Tablet 1 tablet twice a day, Furosemide 40 MG Tablet 1 tablet twice a day, Medication List reviewed and reconciled with the patient       Allergies: N.K.D.A.      Objective:     Vitals: Wt 187.6, Wt change -5.4 lb, Ht 63, BMI 33.23, Pulse sitting 80 irregular, BP sitting 128/86.       Examination:  Cardiology, General:  GENERAL APPEARANCE: pleasant, NAD.  HEENT: unremarkable.  CAROTID UPSTROKE: normal, no bruit.    JVD: flat.  HEART SOUNDS: normal S1, S2, no S3 or S4, irregularly irregular.  MURMUR: 2/6, SEM best heard at the base with radiation to LLSB.  LUNGS: no rales or wheezes.  ABDOMEN: soft, non tender, positive bowel sounds, no masses felt.  EXTREMITIES: no leg edema.  PERIPHERAL PULSES: 2 plus bilateral.        Assessment:     Assessment:  1. Atrial fibrillation - 427.31 (Primary)  2. Essential hypertension, benign - 401.1  3. Diastolic CHF - 428.30    Plan:     1. Atrial fibrillation Continue Carvedilol Tablet, 6.25 MG, 2 tablets, Orally, Twice a day ; Continue Eliquis 2.5mg Tablet, ., 1 tablet, orally, twice a day .  Diagnostic Imaging:EC Echocardiogram (Ordered for  09/19/2011) normal LVF, mod LVH, mildly dilated aorta, mild MAC, calcfied AMV leaflet, trivial MR, mod AVSC, mod LAE, mild TR, mild AS, grade II diastolic dysfunction, TURNER,TRACI M 09/20/2011 09:15:08 PM > please let patient konw that echo showed normal LVF with moderately thickened and stiff heart muscle, moderately thickened and calcified AV with mild AS, mod enlargement of LA, mildly leaky TV/MV and mildly calcified MV Plummer,Wanda 09/23/2011 03:04:27 PM > Pt. notified. Forwarding to Dr. Fried. FRIED,ROBERT 09/24/2011 06:56:18 AM >noted  She has been on blood thinners for 2 weeks so I will wait another 2-3 weeks and schedule DCCV.       2. Essential hypertension, benign Continue Amlodipine Besylate Tablet, 10 MG, 1 tablet, Orally, once a day ; Continue Carvedilol Tablet, 6.25 MG, 2 tablets, Orally, Twice a day .  Diagnostic Imaging:EKG atrial fibrillation, Plummer,Wanda 09/12/2011 10:28:31 AM > TURNER,TRACI M 09/12/2011 10:40:55 AM >       3. Diastolic CHF Continue Carvedilol Tablet, 6.25 MG, 2 tablets, Orally, Twice a day ; Continue Furosemide Tablet, 40 MG, 1 tablet, Orally, twice a day .        Immunizations:        Labs:        Procedure Codes: 93000 EKG I AND R       Preventive:         Follow Up: cardioversion      Provider: Traci Turner, MD  Patient: [name] DOB: 07/07/1925 Date: 09/12/2011    

## 2011-09-30 NOTE — Anesthesia Preprocedure Evaluation (Addendum)
Anesthesia Evaluation  Patient identified by MRN, date of birth, ID band Patient awake    Reviewed: Allergy & Precautions, H&P , NPO status , Patient's Chart, lab work & pertinent test results, reviewed documented beta blocker date and time   Airway Mallampati: II  Neck ROM: Full    Dental  (+) Edentulous Upper and Dental Advisory Given   Pulmonary          Cardiovascular hypertension, Pt. on medications and Pt. on home beta blockers     Neuro/Psych Anxiety    GI/Hepatic Colostomy x30 years s/p colon cancer resection   Endo/Other  Well Controlled, Insulin Dependent and Oral Hypoglycemic Agents  Renal/GU      Musculoskeletal   Abdominal   Peds  Hematology   Anesthesia Other Findings   Reproductive/Obstetrics                           Anesthesia Physical Anesthesia Plan  ASA: III  Anesthesia Plan: General   Post-op Pain Management:    Induction: Intravenous  Airway Management Planned: Mask  Additional Equipment:   Intra-op Plan:   Post-operative Plan:   Informed Consent: I have reviewed the patients History and Physical, chart, labs and discussed the procedure including the risks, benefits and alternatives for the proposed anesthesia with the patient or authorized representative who has indicated his/her understanding and acceptance.     Plan Discussed with: CRNA and Surgeon  Anesthesia Plan Comments:         Anesthesia Quick Evaluation

## 2011-09-30 NOTE — CV Procedure (Addendum)
Electrical Cardioversion Procedure Note [name] [id number] 08-13-1925  Procedure: Electrical Cardioversion Indications:  Atrial Fibrillation  Time Out: Verified patient identification, verified procedure,medications/allergies/relevent history reviewed, required imaging and test results available.  Performed  Procedure Details  The patient was NPO after midnight. Anesthesia was administered at the beside  by [facility] with 100mg  of propofol.  Cardioversion was done with synchronized biphasic defibrillation with AP pads with 150watts.  The patient converted to normal sinus rhythm. The patient tolerated the procedure well   IMPRESSION:  Successful cardioversion of atrial fibrillation  PLAN: Decrease Lasix to 40mg  daily due to renal insufficiency Followup with my NP in 2 weeks    [name] R 09/30/2011, 1:37 PM

## 2011-09-30 NOTE — Preoperative (Signed)
Beta Blockers   Reason not to administer Beta Blockers:Took coreg this morning

## 2011-09-30 NOTE — Transfer of Care (Signed)
Immediate Anesthesia Transfer of Care Note  Patient: [name]  Procedure(s) Performed: Procedure(s) (LRB): CARDIOVERSION (N/A)  Patient Location: Short Stay  Anesthesia Type: General  Level of Consciousness: awake and alert   Airway & Oxygen Therapy: Patient Spontanous Breathing  Post-op Assessment: Report given to PACU RN  Post vital signs: Reviewed and stable  Complications: No apparent anesthesia complications

## 2011-09-30 NOTE — Interval H&P Note (Signed)
History and Physical Interval Note:  09/30/2011 1:37 PM  [name]  has presented today for surgery, with the diagnosis of AFIB  The various methods of treatment have been discussed with the patient and family. After consideration of risks, benefits and other options for treatment, the patient has consented to  Procedure(s) (LRB): CARDIOVERSION (N/A) as a surgical intervention .  The patient's history has been reviewed, patient examined, no change in status, stable for surgery.  I have reviewed the patients' chart and labs.  Questions were answered to the patient's satisfaction.     TURNER,TRACI R

## 2011-10-01 ENCOUNTER — Encounter (HOSPITAL_COMMUNITY): Payer: Self-pay | Admitting: Cardiology

## 2011-10-10 ENCOUNTER — Other Ambulatory Visit (HOSPITAL_COMMUNITY): Payer: Self-pay | Admitting: Cardiology

## 2011-10-10 DIAGNOSIS — I4891 Unspecified atrial fibrillation: Secondary | ICD-10-CM

## 2011-10-17 ENCOUNTER — Ambulatory Visit (HOSPITAL_COMMUNITY)
Admission: RE | Admit: 2011-10-17 | Discharge: 2011-10-17 | Disposition: A | Discharge status: Home / Self Care | Payer: Medicare Other | Source: Ambulatory Visit | Attending: Cardiology | Admitting: Cardiology

## 2011-10-17 ENCOUNTER — Encounter (HOSPITAL_COMMUNITY): Payer: Medicare Other

## 2011-10-17 DIAGNOSIS — I4891 Unspecified atrial fibrillation: Secondary | ICD-10-CM | POA: Insufficient documentation

## 2011-10-17 DIAGNOSIS — Z79899 Other long term (current) drug therapy: Secondary | ICD-10-CM | POA: Insufficient documentation

## 2011-11-04 ENCOUNTER — Other Ambulatory Visit: Payer: Self-pay | Admitting: Family Medicine

## 2011-11-04 DIAGNOSIS — M545 Low back pain: Secondary | ICD-10-CM

## 2011-11-08 ENCOUNTER — Ambulatory Visit
Admission: RE | Admit: 2011-11-08 | Discharge: 2011-11-08 | Disposition: A | Discharge status: Home / Self Care | Payer: Medicare Other | Source: Ambulatory Visit | Attending: Family Medicine | Admitting: Family Medicine

## 2011-11-08 DIAGNOSIS — M545 Low back pain: Secondary | ICD-10-CM

## 2011-11-19 ENCOUNTER — Emergency Department (HOSPITAL_BASED_OUTPATIENT_CLINIC_OR_DEPARTMENT_OTHER): Payer: Medicare Other

## 2011-11-19 ENCOUNTER — Inpatient Hospital Stay (HOSPITAL_BASED_OUTPATIENT_CLINIC_OR_DEPARTMENT_OTHER)
Admission: EM | Admit: 2011-11-19 | Discharge: 2011-11-21 | DRG: 908 | Disposition: A | Discharge status: Home / Self Care | Payer: Medicare Other | Attending: Orthopedic Surgery | Admitting: Orthopedic Surgery

## 2011-11-19 ENCOUNTER — Encounter (HOSPITAL_BASED_OUTPATIENT_CLINIC_OR_DEPARTMENT_OTHER): Payer: Self-pay | Admitting: *Deleted

## 2011-11-19 DIAGNOSIS — E1139 Type 2 diabetes mellitus with other diabetic ophthalmic complication: Secondary | ICD-10-CM | POA: Diagnosis present

## 2011-11-19 DIAGNOSIS — Z85038 Personal history of other malignant neoplasm of large intestine: Secondary | ICD-10-CM

## 2011-11-19 DIAGNOSIS — I359 Nonrheumatic aortic valve disorder, unspecified: Secondary | ICD-10-CM | POA: Diagnosis present

## 2011-11-19 DIAGNOSIS — I5032 Chronic diastolic (congestive) heart failure: Secondary | ICD-10-CM | POA: Diagnosis present

## 2011-11-19 DIAGNOSIS — E11319 Type 2 diabetes mellitus with unspecified diabetic retinopathy without macular edema: Secondary | ICD-10-CM | POA: Diagnosis present

## 2011-11-19 DIAGNOSIS — I4510 Unspecified right bundle-branch block: Secondary | ICD-10-CM | POA: Diagnosis present

## 2011-11-19 DIAGNOSIS — I509 Heart failure, unspecified: Secondary | ICD-10-CM | POA: Diagnosis present

## 2011-11-19 DIAGNOSIS — I10 Essential (primary) hypertension: Secondary | ICD-10-CM | POA: Diagnosis present

## 2011-11-19 DIAGNOSIS — Z794 Long term (current) use of insulin: Secondary | ICD-10-CM

## 2011-11-19 DIAGNOSIS — I4891 Unspecified atrial fibrillation: Secondary | ICD-10-CM | POA: Diagnosis present

## 2011-11-19 DIAGNOSIS — Z189 Retained foreign body fragments, unspecified material: Secondary | ICD-10-CM

## 2011-11-19 DIAGNOSIS — W19XXXA Unspecified fall, initial encounter: Secondary | ICD-10-CM | POA: Diagnosis present

## 2011-11-19 DIAGNOSIS — S51009A Unspecified open wound of unspecified elbow, initial encounter: Principal | ICD-10-CM | POA: Diagnosis present

## 2011-11-19 LAB — CBC WITH DIFFERENTIAL/PLATELET
Basophils Relative: 0 % (ref 0–1)
Eosinophils Absolute: 0.1 10*3/uL (ref 0.0–0.7)
Hemoglobin: 10.7 g/dL — ABNORMAL LOW (ref 12.0–15.0)
MCH: 29.2 pg (ref 26.0–34.0)
MCHC: 32.4 g/dL (ref 30.0–36.0)
Monocytes Absolute: 0.7 10*3/uL (ref 0.1–1.0)
Monocytes Relative: 8 % (ref 3–12)
Neutrophils Relative %: 74 % (ref 43–77)
RDW: 13.3 % (ref 11.5–15.5)

## 2011-11-19 LAB — BASIC METABOLIC PANEL
BUN: 32 mg/dL — ABNORMAL HIGH (ref 6–23)
Creatinine, Ser: 2.4 mg/dL — ABNORMAL HIGH (ref 0.50–1.10)
GFR calc Af Amer: 20 mL/min — ABNORMAL LOW (ref >90)
GFR calc non Af Amer: 18 mL/min — ABNORMAL LOW (ref >90)

## 2011-11-19 MED ORDER — CEFAZOLIN SODIUM 1-5 GM-% IV SOLN: 1.0000 g | Freq: Once | INTRAVENOUS | Status: DC

## 2011-11-19 MED ORDER — CEFAZOLIN SODIUM 1-5 GM-% IV SOLN
1.0000 g | Freq: Once | INTRAVENOUS | Status: AC
  Administered 2011-11-19: 1 g via INTRAVENOUS
  Filled 2011-11-19: qty 50

## 2011-11-19 MED ORDER — TETANUS-DIPHTH-ACELL PERTUSSIS 5-2.5-18.5 LF-MCG/0.5 IM SUSP
0.5000 mL | Freq: Once | INTRAMUSCULAR | Status: AC
  Administered 2011-11-19: 0.5 mL via INTRAMUSCULAR
  Filled 2011-11-19: qty 0.5

## 2011-11-19 MED ORDER — LIDOCAINE-EPINEPHRINE 2 %-1:100000 IJ SOLN
INTRAMUSCULAR | Status: AC
  Filled 2011-11-19: qty 1

## 2011-11-19 MED ORDER — CEFAZOLIN SODIUM 1-5 GM-% IV SOLN
1.0000 g | Freq: Once | INTRAVENOUS | Status: AC
  Administered 2011-11-19: 1 g via INTRAVENOUS

## 2011-11-19 MED ORDER — DEXTROSE 5 % IV SOLN
2.0000 g | Freq: Once | INTRAVENOUS | Status: DC
  Filled 2011-11-19: qty 10

## 2011-11-19 MED ORDER — GENTAMICIN SULFATE 40 MG/ML IJ SOLN
200.0000 mg | Freq: Once | INTRAVENOUS | Status: DC
  Filled 2011-11-19: qty 5

## 2011-11-19 MED ORDER — TRAMADOL HCL 50 MG PO TABS
50.0000 mg | ORAL_TABLET | Freq: Once | ORAL | Status: AC
  Administered 2011-11-19: 50 mg via ORAL
  Filled 2011-11-19: qty 1

## 2011-11-19 MED ORDER — SODIUM CHLORIDE 0.9 % IV SOLN
INTRAVENOUS | Status: DC
  Administered 2011-11-19 – 2011-11-20 (×2): via INTRAVENOUS

## 2011-11-19 NOTE — ED Notes (Signed)
Pt assisted to bedside commode. Pt was able to stand, bear weight and pivot. Pt emptied colostomy bag.

## 2011-11-19 NOTE — ED Provider Notes (Signed)
History     CSN: [id number]  Arrival date & time 11/19/11  1914   First MD Initiated Contact with Patient 11/19/11 1947      Chief Complaint  Patient presents with  . Arm Injury    (Consider location/radiation/quality/duration/timing/severity/associated sxs/prior treatment) HPI Patient is an 78 year old female who presents today with family for evaluation of left elbow and left lower extremity injuries following a fall. Patient reported that her fall occurred when she got tangled up while trying to care for her dog in the yard. Patient landed in a pile of arrival and has left posterior elbow soft tissue avulsion and laceration with significant contamination with gravel. Patient also complains of left lower shiny pain. She has bilateral lower extremity edema at baseline. She reports this is no worse than usual. She does have nonbleeding abrasion noted over the left lower leg. Though there is no bleeding there is some weeping of fluid which patient reports she is seen when she has had injuries previously due to her edema. Patient denied any head trauma or loss of consciousness. Patient reports that her pain is mild. Patient is a diabetic and also has history of atrial fibrillation which she takes apixaban for.  Patient does not have significant pain on flexion extension of the affected elbow or knee. There are no other associated or modifying factors. Patient reports pain is moderate. Past Medical History  Diagnosis Date  . Colon cancer   . Hypertension   . Renal insufficiency, mild   . Anxiety   . Diabetes mellitus     diabetic retinopathy  . Anemia   . Aortic stenosis, mild     Past Surgical History  Procedure Date  . Abdominal surgery   . Abdominal hysterectomy   . Colon surgery   . Appendectomy   . Colostomy   . Cardioversion 09/30/2011    Procedure: CARDIOVERSION;  Surgeon: Quintella Reichert, MD;  Location: [facility] OR;  Service: Cardiovascular;  Laterality: N/A;    History reviewed.  No pertinent family history.  History  Substance Use Topics  . Smoking status: Never Smoker   . Smokeless tobacco: Current User  . Alcohol Use: No    OB History    Grav Para Term Preterm Abortions TAB SAB Ect Mult Living                  Review of Systems  Constitutional: Negative.   HENT: Negative.   Eyes: Negative.   Respiratory: Negative.   Cardiovascular: Negative.   Gastrointestinal: Negative.   Genitourinary: Negative.   Musculoskeletal: Negative.        Left knee and elbow pain  Skin: Positive for wound.  Neurological: Negative.   Hematological: Negative.   Psychiatric/Behavioral: Negative.   All other systems reviewed and are negative.    Allergies  Review of patient's allergies indicates no known allergies.  Home Medications   Current Outpatient Rx  Name Route Sig Dispense Refill  . ALPRAZOLAM 0.5 MG PO TABS Oral Take 0.5 mg by mouth 2 (two) times daily as needed. For anxiety    . AMITRIPTYLINE HCL 50 MG PO TABS Oral Take 50 mg by mouth at bedtime.    Marland Kitchen AMLODIPINE BESYLATE 10 MG PO TABS Oral Take 10 mg by mouth daily.    . APIXABAN 2.5 MG PO TABS Oral Take 2.5 mg by mouth 2 (two) times daily.    Marland Kitchen CARVEDILOL 12.5 MG PO TABS Oral Take 12.5 mg by mouth 2 (two) times daily  with a meal.    . DOCUSATE SODIUM 100 MG PO CAPS Oral Take 100 mg by mouth daily as needed. For constipation    . FUROSEMIDE 40 MG PO TABS Oral Take 1 tablet (40 mg total) by mouth daily.    . INSULIN GLARGINE 100 UNIT/ML [place] SOLN Subcutaneous Inject 8 Units into the skin at bedtime.    Marland Kitchen SITAGLIPTIN PHOSPHATE 25 MG PO TABS Oral Take 25 mg by mouth daily.    Marland Kitchen VITAMIN B-12 1000 MCG PO TABS Oral Take 1,000 mcg by mouth daily.      BP 147/76  Pulse 94  Temp 98 F (36.7 C) (Oral)  Resp 18  Ht 5\' 3"  (1.6 m)  Wt 184 lb (83.462 kg)  BMI 32.59 kg/m2  SpO2 94%  Physical Exam  Nursing note and vitals reviewed. GEN: Well-developed, well-nourished female in no distress HEENT: Atraumatic,  normocephalic. Oropharynx clear without erythema EYES: PERRLA BL, no scleral icterus. NECK: Trachea midline, no meningismus CV: regular rate and rhythm. No murmurs, rubs, or gallops PULM: No respiratory distress.  No crackles, wheezes, or rales. GI: soft, non-tender. No guarding, rebound, or tenderness. + bowel sounds  GU: deferred Neuro: cranial nerves grossly 2-12 intact, no abnormalities of strength or sensation, A and O x 3 MSK: Tenderness to palpation over wound overlying the posterior left elbow, no tenderness with flexion extension of the elbow. Patient with tenderness to palpation over the left knee. No gross effusion or deformity noted. Patient does have bilateral lower extremity pitting edema which is reportedly at baseline.  Skin: No rashes petechiae, purpura, or jaundice. Patient has large dirt contaminated abrasion over the left forearm that is approximately 3" x 2", patient also has a large soft tissue oval lesion injury with significant  contamination as well and is approximately 2" x 2" with significant amounts of foreign material rate is.  Psych: no abnormality of mood   ED Course  Procedures (including critical care time)  Labs Reviewed  CBC WITH DIFFERENTIAL - Abnormal; Notable for the following:    RBC 3.66 (*)     Hemoglobin 10.7 (*)     HCT 33.0 (*)     All other components within normal limits  BASIC METABOLIC PANEL - Abnormal; Notable for the following:    Glucose, Bld 114 (*)     BUN 32 (*)     Creatinine, Ser 2.40 (*)     GFR calc non Af Amer 18 (*)     GFR calc Af Amer 20 (*)     All other components within normal limits  PROTIME-INR - Abnormal; Notable for the following:    Prothrombin Time 17.0 (*)     All other components within normal limits  APTT - Abnormal; Notable for the following:    aPTT 42 (*)     All other components within normal limits   Dg Elbow Complete Left  11/19/2011  *RADIOLOGY REPORT*  Clinical Data: Left elbow pain.  Multiple  laceration and tears after a fall.  LEFT ELBOW - COMPLETE 3+ VIEW  Comparison: Left forearm radiographs 01/18/2010.  Findings: The left elbow is located.  There is no significant effusion.  A large soft tissue laceration is seen posterior to the left elbow.  Radiodense material may represent a foreign body or treatment artifact.  IMPRESSION:  1.  Large soft tissue laceration posterior to the left elbow. 2.  No acute fracture or joint effusion.   Original Report Authenticated By: Jamesetta Orleans. MATTERN,  M.D.    Ct Head Wo Contrast  11/19/2011  *RADIOLOGY REPORT*  Clinical Data: Fall.  Headache.  CT HEAD WITHOUT CONTRAST  Technique:  Contiguous axial images were obtained from the base of the skull through the vertex without contrast.  Comparison: None.  Findings: Encephalomalacia of the right temporal lobe is compatible with a remote right MCA territory infarct.  Ex vacuo dilation is noted in the right lateral ventricle.  There is a remote lacunar infarct of the right external capsule.  Remote lacunar infarcts are present in the thalami bilaterally.  A focus of hypoattenuation in the right corona radiata likely represents remote infarcts as well, but is more agent termini.  Atrophy and subcortical white matter disease bilaterally is compatible with chronic microvascular ischemic change.  No hemorrhage or mass lesion is present.  The ventricles are proportionate to the degree of atrophy.  No significant extra-axial fluid collection is present.  The paranasal sinuses and mastoid air cells are clear.  The osseous skull is intact.  Atherosclerotic calcifications are present in the cavernous carotid arteries bilaterally. No significant extra calvarial soft tissue injury is evident.  IMPRESSION:  1.  Remote posterior right MCA territory infarct. 2.  Remote lacunar infarcts of the basal ganglia bilaterally. 3.  Age indeterminate infarct of the right corona radiata is likely remote as well. 4.  Atrophy and diffuse white  matter disease likely reflects the sequelae of chronic microvascular ischemia. 5.  Atherosclerosis.   Original Report Authenticated By: Jamesetta Orleans. MATTERN, M.D.    Dg Knee Complete 4 Views Left  11/19/2011  *RADIOLOGY REPORT*  Clinical Data: Left knee pain.  Fall.  LEFT KNEE - COMPLETE 4+ VIEW  Comparison: Left knee radiographs 01/18/2010.  Findings: Left knee is located.  No acute bone or soft tissue abnormalities present.  There is no definite effusion. Mild osteopenia is evident.  Degenerative changes are again noted.  IMPRESSION: No acute abnormality.   Original Report Authenticated By: Jamesetta Orleans. MATTERN, M.D.      1. Wound, open, elbow       MDM  Patient presented tonight with mechanical fall with significant can examination and a left elbow wound as well as small abrasion to the left lower leg. Plain films of the left elbow and left knee were performed. Also given the patient is on anticoagulation a CT head was performed to evaluate for possible intracranial bleeding. There were no signs of bleeding. Patient was not having any neurologic symptoms. She denied any chest pain or shortness of breath. Patient had completely stable vital signs upon arrival here. Given that she is on anticoagulation CBC was performed and did show a slight drop of hemoglobin to 10.7. Patient had local anesthesia performed by myself with 7 cc 1% lidocaine with epinephrine. Irrigation revealed significant contamination. This was also deeper and the patient's wound. Given her history of diabetes as well as the amount of dirt and gravel found in the wound I didn't feel that further evaluation for possible cleanout in OR was warranted. I discussed the patient with Dr. Luiz Blare of orthopedics. He accepted the patient in transfer. Patient did receive tetanus shot here and was also given a dose of Ancef and gentamicin. Dosing was based on pharmacy recommendations. As plan was for patient to go to OR basic metabolic panel  was performed. Patient was at her baseline state of renal insufficiency with no hyperkalemia. Report was called to Dr. Sunnie Nielsen as well as patient may need to stop in the  emergency department. Following patient departure by EMS I spoke with Dr. Gwenette Greet request a chest x-ray and EKG. Report was called to [facility] ER for this to occur when patient arrives they are to prepare her for further OR management.        Cyndra Numbers, MD 11/20/11 [phone]

## 2011-11-19 NOTE — H&P (Signed)
PREOPERATIVE H&P  Chief Complaint: fall with contaminated wound left elbow  HPI: [name] is a 78 y.o. female who presents for evaluation of left elbow laceration with contamination. It has been present for 6 hours and has been worsening. The patient is a diabetic who fell earlier tonight and suffered extreme contamination of a laceration to her left elbow.  [place] was [place] where [place] I&D seemed unsuccessful for removing contamination.  They've consult with [place] for more aggressive I&D under general anesthesia and we think this is appropriate given the nature of her diabetes and gross contamination of the wound.  The patient denies previous history of injury to the elbow.  Past Medical History  Diagnosis Date  . Colon cancer   . Hypertension   . Renal insufficiency, mild   . Anxiety   . Diabetes mellitus     diabetic retinopathy  . Anemia   . Aortic stenosis, mild    Past Surgical History  Procedure Date  . Abdominal surgery   . Abdominal hysterectomy   . Colon surgery   . Appendectomy   . Colostomy   . Cardioversion 09/30/2011    Procedure: CARDIOVERSION;  Surgeon: Quintella Reichert, MD;  Location: [facility] OR;  Service: Cardiovascular;  Laterality: N/A;   History   Social History  . Marital Status: Widowed    Spouse Name: N/A    Number of Children: N/A  . Years of Education: N/A   Social History Main Topics  . Smoking status: Never Smoker   . Smokeless tobacco: Current User  . Alcohol Use: No  . Drug Use: No  . Sexually Active:    Other Topics Concern  . None   Social History Narrative  . None   History reviewed. No pertinent family history. No Known Allergies Prior to Admission medications   Medication Sig Start Date End Date Taking? Authorizing Provider  ALPRAZolam Prudy Feeler) 0.5 MG tablet Take 0.5 mg by mouth 2 (two) times daily as needed. For anxiety   Yes Historical Provider, MD  amitriptyline (ELAVIL) 50 MG tablet Take 50 mg by mouth at bedtime.   Yes  Historical Provider, MD  amLODipine (NORVASC) 10 MG tablet Take 10 mg by mouth daily.   Yes Historical Provider, MD  apixaban (ELIQUIS) 2.5 MG TABS tablet Take 2.5 mg by mouth 2 (two) times daily.   Yes Historical Provider, MD  carvedilol (COREG) 12.5 MG tablet Take 12.5 mg by mouth 2 (two) times daily with a meal.   Yes Historical Provider, MD  docusate sodium (COLACE) 100 MG capsule Take 100 mg by mouth daily as needed. For constipation   Yes Historical Provider, MD  furosemide (LASIX) 40 MG tablet Take 1 tablet (40 mg total) by mouth daily. 09/30/11  Yes Quintella Reichert, MD  insulin glargine (LANTUS) 100 UNIT/ML injection Inject 8 Units into the skin at bedtime.   Yes Historical Provider, MD  sitaGLIPtin (JANUVIA) 25 MG tablet Take 25 mg by mouth daily.   Yes Historical Provider, MD  vitamin B-12 (CYANOCOBALAMIN) 1000 MCG tablet Take 1,000 mcg by mouth daily.   Yes Historical Provider, MD     Positive ROS: none  All other systems have been reviewed and were otherwise negative with the exception of those mentioned in the HPI and as above.  Physical Exam: Filed Vitals:   11/19/11 2333  BP: 147/76  Pulse: 94  Temp:   Resp: 18    General: Alert, no acute distress Cardiovascular: No  pedal edema Respiratory: No cyanosis, no use of accessory musculature GI: No organomegaly, abdomen is soft and non-tender Skin: No lesions in the area of chief complaint Neurologic: Sensation intact distally Psychiatric: Patient is competent for consent with normal mood and affect Lymphatic: No axillary or cervical lymphadenopathy  MUSCULOSKELETAL:stellate shaped laceration left elbow with gross contamination.neurovascularly intact distally.  Mild pain to range of motion.  Assessment/Plan: 78 year old diabetic with laceration to left elbow with gross contamination who is been irrigated in the emergency room with inability to alleviate contamination.//Patient needs irrigation and debridement of wounds with  loose closure over drain.  She will be admitted for IV antibiotic therapy.  We will proceed with surgical intervention as soon as an operating room is available.I have contacted both [facility] and [place] operating rooms and it appears as though the [facility] OR will Available Earlier.  I will have the patient transported there and meet her in the hold area and proceed with I&D of the elbow. Plan for   The risks benefits and alternatives were discussed with the patient including but not limited to the risks of nonoperative treatment, versus surgical intervention including infection, bleeding, nerve injury, malunion, nonunion, hardware prominence, hardware failure, need for hardware removal, blood clots, cardiopulmonary complications, morbidity, mortality, among others, and they were willing to proceed.  Predicted outcome is good, although there will be at least a six to nine month expected recovery.  Harvie Junior, MD 11/19/2011 11:44 PM

## 2011-11-19 NOTE — ED Notes (Signed)
MD at bedside. 

## 2011-11-19 NOTE — ED Notes (Addendum)
Pt c/o fall on gravel x 30 mins ago, laceration to left elbow and abrasion to left leg.

## 2011-11-19 NOTE — ED Notes (Signed)
Patient transported to X-ray 

## 2011-11-20 ENCOUNTER — Encounter (HOSPITAL_COMMUNITY): Payer: Self-pay | Admitting: Anesthesiology

## 2011-11-20 ENCOUNTER — Ambulatory Visit (HOSPITAL_COMMUNITY): Admission: EM | Admit: 2011-11-20 | Payer: Medicare Other | Source: Ambulatory Visit | Admitting: Orthopedic Surgery

## 2011-11-20 ENCOUNTER — Encounter (HOSPITAL_COMMUNITY)
Admission: EM | Disposition: A | Discharge status: Home / Self Care | Payer: Self-pay | Source: Home / Self Care | Attending: Orthopedic Surgery

## 2011-11-20 ENCOUNTER — Other Ambulatory Visit: Payer: Self-pay

## 2011-11-20 ENCOUNTER — Inpatient Hospital Stay: Admit: 2011-11-20 | Payer: Self-pay | Admitting: Orthopedic Surgery

## 2011-11-20 ENCOUNTER — Emergency Department (HOSPITAL_COMMUNITY): Payer: Medicare Other

## 2011-11-20 ENCOUNTER — Emergency Department (HOSPITAL_COMMUNITY): Payer: Medicare Other | Admitting: Anesthesiology

## 2011-11-20 DIAGNOSIS — S51009A Unspecified open wound of unspecified elbow, initial encounter: Principal | ICD-10-CM | POA: Diagnosis present

## 2011-11-20 HISTORY — PX: I & D EXTREMITY: SHX5045

## 2011-11-20 LAB — GLUCOSE, CAPILLARY
Glucose-Capillary: 120 mg/dL — ABNORMAL HIGH (ref 70–99)
Glucose-Capillary: 127 mg/dL — ABNORMAL HIGH (ref 70–99)
Glucose-Capillary: 110 mg/dL — ABNORMAL HIGH (ref 70–99)

## 2011-11-20 LAB — COMPREHENSIVE METABOLIC PANEL
Alkaline Phosphatase: 119 U/L — ABNORMAL HIGH (ref 39–117)
BUN: 27 mg/dL — ABNORMAL HIGH (ref 6–23)
Chloride: 101 mEq/L (ref 96–112)
Creatinine, Ser: 2.13 mg/dL — ABNORMAL HIGH (ref 0.50–1.10)
GFR calc Af Amer: 24 mL/min — ABNORMAL LOW (ref >90)
GFR calc non Af Amer: 20 mL/min — ABNORMAL LOW (ref >90)
Glucose, Bld: 108 mg/dL — ABNORMAL HIGH (ref 70–99)
Potassium: 4.1 mEq/L (ref 3.5–5.1)
Total Bilirubin: 0.2 mg/dL — ABNORMAL LOW (ref 0.3–1.2)

## 2011-11-20 LAB — PRO B NATRIURETIC PEPTIDE: Pro B Natriuretic peptide (BNP): 4130 pg/mL — ABNORMAL HIGH (ref 0–450)

## 2011-11-20 LAB — TROPONIN I: Troponin I: <0.3 ng/mL (ref <0.30)

## 2011-11-20 SURGERY — IRRIGATION AND DEBRIDEMENT EXTREMITY
Anesthesia: General | Site: Elbow | Laterality: Left | Wound class: Dirty or Infected

## 2011-11-20 MED ORDER — DOCUSATE SODIUM 100 MG PO CAPS
100.0000 mg | ORAL_CAPSULE | Freq: Every day | ORAL | Status: DC
  Administered 2011-11-20 – 2011-11-21 (×2): 100 mg via ORAL
  Filled 2011-11-20 (×2): qty 1

## 2011-11-20 MED ORDER — ONDANSETRON HCL 4 MG/2ML IJ SOLN
INTRAMUSCULAR | Status: DC | PRN
  Administered 2011-11-20: 4 mg via INTRAVENOUS

## 2011-11-20 MED ORDER — LIDOCAINE HCL (CARDIAC) 20 MG/ML IV SOLN
INTRAVENOUS | Status: DC | PRN
  Administered 2011-11-20: 60 mg via INTRAVENOUS

## 2011-11-20 MED ORDER — ETOMIDATE 2 MG/ML IV SOLN
INTRAVENOUS | Status: DC | PRN
  Administered 2011-11-20: 13 mg via INTRAVENOUS

## 2011-11-20 MED ORDER — FENTANYL CITRATE 0.05 MG/ML IJ SOLN
INTRAMUSCULAR | Status: DC | PRN
  Administered 2011-11-20: 50 ug via INTRAVENOUS

## 2011-11-20 MED ORDER — INSULIN ASPART 100 UNIT/ML ~~LOC~~ SOLN
0.0000 [IU] | Freq: Three times a day (TID) | SUBCUTANEOUS | Status: DC
  Administered 2011-11-20 (×3): 2 [IU] via SUBCUTANEOUS

## 2011-11-20 MED ORDER — SODIUM CHLORIDE 0.9 % IR SOLN
Status: DC | PRN
  Administered 2011-11-20: 3000 mL

## 2011-11-20 MED ORDER — AMITRIPTYLINE HCL 50 MG PO TABS
50.0000 mg | ORAL_TABLET | Freq: Every day | ORAL | Status: DC
  Administered 2011-11-20: 50 mg via ORAL
  Filled 2011-11-20 (×2): qty 1

## 2011-11-20 MED ORDER — VITAMIN B-12 1000 MCG PO TABS
1000.0000 ug | ORAL_TABLET | Freq: Every day | ORAL | Status: DC
  Administered 2011-11-20 – 2011-11-21 (×2): 1000 ug via ORAL
  Filled 2011-11-20 (×2): qty 1

## 2011-11-20 MED ORDER — CEFAZOLIN SODIUM-DEXTROSE 2-3 GM-% IV SOLR
2.0000 g | Freq: Three times a day (TID) | INTRAVENOUS | Status: DC
  Filled 2011-11-20 (×2): qty 50

## 2011-11-20 MED ORDER — HYDROCODONE-ACETAMINOPHEN 5-325 MG PO TABS
1.0000 | ORAL_TABLET | ORAL | Status: DC | PRN
Start: 2011-11-20 — End: 2011-11-21

## 2011-11-20 MED ORDER — ONDANSETRON HCL 4 MG PO TABS: 4.0000 mg | ORAL_TABLET | Freq: Four times a day (QID) | ORAL | Status: DC | PRN

## 2011-11-20 MED ORDER — HYDROMORPHONE HCL PF 1 MG/ML IJ SOLN: 0.5000 mg | INTRAMUSCULAR | Status: DC | PRN

## 2011-11-20 MED ORDER — GENTAMICIN SULFATE 40 MG/ML IJ SOLN
200.0000 mg | Freq: Once | INTRAVENOUS | Status: AC
  Administered 2011-11-20: 200 mg via INTRAVENOUS
  Filled 2011-11-20: qty 5

## 2011-11-20 MED ORDER — AMIODARONE HCL 200 MG PO TABS
300.0000 mg | ORAL_TABLET | Freq: Every day | ORAL | Status: DC
  Administered 2011-11-20 – 2011-11-21 (×2): 300 mg via ORAL
  Filled 2011-11-20 (×2): qty 1

## 2011-11-20 MED ORDER — SODIUM CHLORIDE 0.9 % IV SOLN
INTRAVENOUS | Status: DC | PRN
  Administered 2011-11-20: 01:00:00 via INTRAVENOUS

## 2011-11-20 MED ORDER — 0.9 % SODIUM CHLORIDE (POUR BTL) OPTIME
TOPICAL | Status: DC | PRN
  Administered 2011-11-20: 1000 mL

## 2011-11-20 MED ORDER — HYDROMORPHONE HCL PF 1 MG/ML IJ SOLN: 0.2500 mg | INTRAMUSCULAR | Status: DC | PRN

## 2011-11-20 MED ORDER — LACTATED RINGERS IV SOLN: INTRAVENOUS | Status: DC | PRN

## 2011-11-20 MED ORDER — CARVEDILOL 12.5 MG PO TABS
12.5000 mg | ORAL_TABLET | Freq: Two times a day (BID) | ORAL | Status: DC
  Administered 2011-11-20 – 2011-11-21 (×3): 12.5 mg via ORAL
  Filled 2011-11-20 (×5): qty 1

## 2011-11-20 MED ORDER — SUCCINYLCHOLINE CHLORIDE 20 MG/ML IJ SOLN
INTRAMUSCULAR | Status: DC | PRN
  Administered 2011-11-20: 100 mg via INTRAVENOUS

## 2011-11-20 MED ORDER — LINAGLIPTIN 5 MG PO TABS
5.0000 mg | ORAL_TABLET | Freq: Every day | ORAL | Status: DC
  Administered 2011-11-20 – 2011-11-21 (×2): 5 mg via ORAL
  Filled 2011-11-20 (×2): qty 1

## 2011-11-20 MED ORDER — ONDANSETRON HCL 4 MG/2ML IJ SOLN
4.0000 mg | Freq: Four times a day (QID) | INTRAMUSCULAR | Status: DC | PRN
  Administered 2011-11-21: 4 mg via INTRAVENOUS
  Filled 2011-11-20: qty 2

## 2011-11-20 MED ORDER — ALPRAZOLAM 0.5 MG PO TABS: 0.5000 mg | ORAL_TABLET | Freq: Two times a day (BID) | ORAL | Status: DC | PRN

## 2011-11-20 MED ORDER — CEFAZOLIN SODIUM-DEXTROSE 2-3 GM-% IV SOLR
2.0000 g | Freq: Two times a day (BID) | INTRAVENOUS | Status: DC
  Administered 2011-11-20 – 2011-11-21 (×3): 2 g via INTRAVENOUS
  Filled 2011-11-20 (×4): qty 50

## 2011-11-20 MED ORDER — FUROSEMIDE 40 MG PO TABS
40.0000 mg | ORAL_TABLET | Freq: Every day | ORAL | Status: DC
  Administered 2011-11-20 – 2011-11-21 (×2): 40 mg via ORAL
  Filled 2011-11-20 (×2): qty 1

## 2011-11-20 MED ORDER — AMLODIPINE BESYLATE 10 MG PO TABS
10.0000 mg | ORAL_TABLET | Freq: Every day | ORAL | Status: DC
  Administered 2011-11-20 – 2011-11-21 (×2): 10 mg via ORAL
  Filled 2011-11-20 (×2): qty 1

## 2011-11-20 MED ORDER — INSULIN GLARGINE 100 UNIT/ML ~~LOC~~ SOLN
5.0000 [IU] | Freq: Every day | SUBCUTANEOUS | Status: DC
  Administered 2011-11-20: 5 [IU] via SUBCUTANEOUS

## 2011-11-20 MED ORDER — SODIUM CHLORIDE 0.9 % IR SOLN
Status: DC | PRN
  Administered 2011-11-20: 02:00:00

## 2011-11-20 SURGICAL SUPPLY — 59 items
BAG DECANTER FOR FLEXI CONT (MISCELLANEOUS) IMPLANT
BANDAGE ELASTIC 4 VELCRO ST LF (GAUZE/BANDAGES/DRESSINGS) ×3 IMPLANT
BANDAGE ELASTIC 6 VELCRO ST LF (GAUZE/BANDAGES/DRESSINGS) ×1 IMPLANT
BANDAGE ESMARK 6X9 LF (GAUZE/BANDAGES/DRESSINGS) IMPLANT
BANDAGE GAUZE ELAST BULKY 4 IN (GAUZE/BANDAGES/DRESSINGS) ×3 IMPLANT
BNDG CMPR 9X6 STRL LF SNTH (GAUZE/BANDAGES/DRESSINGS)
BNDG COHESIVE 4X5 TAN STRL (GAUZE/BANDAGES/DRESSINGS) ×1 IMPLANT
BNDG ESMARK 6X9 LF (GAUZE/BANDAGES/DRESSINGS)
CLOTH BEACON ORANGE TIMEOUT ST (SAFETY) ×2 IMPLANT
CUFF TOURNIQUET SINGLE 18IN (TOURNIQUET CUFF) ×2 IMPLANT
CUFF TOURNIQUET SINGLE 24IN (TOURNIQUET CUFF) IMPLANT
CUFF TOURNIQUET SINGLE 34IN LL (TOURNIQUET CUFF) IMPLANT
CUFF TOURNIQUET SINGLE 44IN (TOURNIQUET CUFF) IMPLANT
DRAIN PENROSE 1/2X12 LTX STRL (WOUND CARE) ×1 IMPLANT
DRAPE U-SHAPE 47X51 STRL (DRAPES) ×2 IMPLANT
DRSG ADAPTIC 3X8 NADH LF (GAUZE/BANDAGES/DRESSINGS) ×1 IMPLANT
DRSG EMULSION OIL 3X3 NADH (GAUZE/BANDAGES/DRESSINGS) ×1 IMPLANT
DRSG PAD ABDOMINAL 8X10 ST (GAUZE/BANDAGES/DRESSINGS) ×2 IMPLANT
DURAPREP 26ML APPLICATOR (WOUND CARE) ×1 IMPLANT
ELECT CAUTERY BLADE 6.4 (BLADE) IMPLANT
ELECT REM PT RETURN 9FT ADLT (ELECTROSURGICAL) ×2
ELECTRODE REM PT RTRN 9FT ADLT (ELECTROSURGICAL) IMPLANT
GAUZE XEROFORM 1X8 LF (GAUZE/BANDAGES/DRESSINGS) ×1 IMPLANT
GAUZE XEROFORM 5X9 LF (GAUZE/BANDAGES/DRESSINGS) ×1 IMPLANT
GLOVE BIOGEL PI IND STRL 6.5 (GLOVE) IMPLANT
GLOVE BIOGEL PI IND STRL 7.5 (GLOVE) IMPLANT
GLOVE BIOGEL PI IND STRL 8 (GLOVE) ×2 IMPLANT
GLOVE BIOGEL PI INDICATOR 6.5 (GLOVE) ×1
GLOVE BIOGEL PI INDICATOR 7.5 (GLOVE) ×1
GLOVE BIOGEL PI INDICATOR 8 (GLOVE) ×2
GLOVE ECLIPSE 7.5 STRL STRAW (GLOVE) ×4 IMPLANT
GLOVE SURG SS PI 7.5 STRL IVOR (GLOVE) ×1 IMPLANT
GOWN PREVENTION PLUS XLARGE (GOWN DISPOSABLE) ×2 IMPLANT
GOWN SRG XL XLNG 56XLVL 4 (GOWN DISPOSABLE) ×1 IMPLANT
GOWN STRL NON-REIN LRG LVL3 (GOWN DISPOSABLE) ×3 IMPLANT
GOWN STRL NON-REIN XL XLG LVL4 (GOWN DISPOSABLE) ×2
HANDPIECE INTERPULSE COAX TIP (DISPOSABLE) ×4
KIT BASIN OR (CUSTOM PROCEDURE TRAY) ×2 IMPLANT
KIT ROOM TURNOVER OR (KITS) ×2 IMPLANT
MANIFOLD NEPTUNE II (INSTRUMENTS) ×2 IMPLANT
NS IRRIG 1000ML POUR BTL (IV SOLUTION) ×2 IMPLANT
PACK ORTHO EXTREMITY (CUSTOM PROCEDURE TRAY) ×2 IMPLANT
PAD ARMBOARD 7.5X6 YLW CONV (MISCELLANEOUS) ×4 IMPLANT
PAD CAST 4YDX4 CTTN HI CHSV (CAST SUPPLIES) ×1 IMPLANT
PADDING CAST COTTON 4X4 STRL (CAST SUPPLIES) ×2
SET HNDPC FAN SPRY TIP SCT (DISPOSABLE) IMPLANT
SLING ARM IMMOBILIZER LRG (SOFTGOODS) ×1 IMPLANT
SPONGE GAUZE 4X4 12PLY (GAUZE/BANDAGES/DRESSINGS) ×2 IMPLANT
SPONGE LAP 18X18 X RAY DECT (DISPOSABLE) ×2 IMPLANT
STAPLER VISISTAT 35W (STAPLE) ×1 IMPLANT
STOCKINETTE IMPERVIOUS 9X36 MD (GAUZE/BANDAGES/DRESSINGS) ×2 IMPLANT
SUT ETHILON 2 0 PSLX (SUTURE) ×2 IMPLANT
TOWEL OR 17X24 6PK STRL BLUE (TOWEL DISPOSABLE) ×2 IMPLANT
TOWEL OR 17X26 10 PK STRL BLUE (TOWEL DISPOSABLE) ×2 IMPLANT
TUBE ANAEROBIC SPECIMEN COL (MISCELLANEOUS) IMPLANT
TUBE CONNECTING 12X1/4 (SUCTIONS) ×2 IMPLANT
UNDERPAD 30X30 INCONTINENT (UNDERPADS AND DIAPERS) ×2 IMPLANT
WATER STERILE IRR 1000ML POUR (IV SOLUTION) ×2 IMPLANT
YANKAUER SUCT BULB TIP NO VENT (SUCTIONS) ×2 IMPLANT

## 2011-11-20 NOTE — Anesthesia Postprocedure Evaluation (Signed)
  Anesthesia Post-op Note  Patient: [name]  Procedure(s) Performed: Procedure(s) (LRB): IRRIGATION AND DEBRIDEMENT EXTREMITY (Left)  Patient Location: PACU  Anesthesia Type: General  Level of Consciousness: awake  Airway and Oxygen Therapy: Patient Spontanous Breathing  Post-op Pain: mild  Post-op Assessment: Post-op Vital signs reviewed  Post-op Vital Signs: Reviewed  Complications: No apparent anesthesia complications

## 2011-11-20 NOTE — Anesthesia Preprocedure Evaluation (Signed)
Anesthesia Evaluation  Patient identified by MRN, date of birth, ID band Patient awake    Reviewed: Allergy & Precautions, H&P , NPO status , Patient's Chart, lab work & pertinent test results  Airway Mallampati: II      Dental   Pulmonary  breath sounds clear to auscultation        Cardiovascular Rhythm:Irregular     Neuro/Psych    GI/Hepatic   Endo/Other    Renal/GU      Musculoskeletal   Abdominal   Peds  Hematology   Anesthesia Other Findings   Reproductive/Obstetrics                           Anesthesia Physical Anesthesia Plan  ASA: IV  Anesthesia Plan: General   Post-op Pain Management:    Induction: Intravenous, Rapid sequence and Cricoid pressure planned  Airway Management Planned: Oral ETT  Additional Equipment:   Intra-op Plan:   Post-operative Plan: Possible Post-op intubation/ventilation  Informed Consent:   Plan Discussed with: CRNA, Anesthesiologist and Surgeon  Anesthesia Plan Comments:         Anesthesia Quick Evaluation

## 2011-11-20 NOTE — Brief Op Note (Signed)
11/19/2011 - 11/20/2011  2:16 AM  PATIENT:  [name]  78 y.o. female  PRE-OPERATIVE DIAGNOSIS:  LEFT ELBOW WOUND  POST-OPERATIVE DIAGNOSIS:  LEFT ELBOW WOUND  PROCEDURE:  Procedure(s) (LRB): IRRIGATION AND DEBRIDEMENT EXTREMITY (Left)  SURGEON:  Surgeon(s) and Role:    * Harvie Junior, MD - Primary  PHYSICIAN ASSISTANT:   ASSISTANTS: bethune  ANESTHESIA:   general  EBL:     BLOOD ADMINISTERED:none  DRAINS: Penrose drain in the l elbow region    LOCAL MEDICATIONS USED:  NONE  SPECIMEN:  No Specimen  DISPOSITION OF SPECIMEN:  N/A  COUNTS:  YES  TOURNIQUET:   Total Tourniquet Time Documented: Upper Arm (Left) - 32 minutes  DICTATION: .Other Dictation: Dictation Number [id number]  PLAN OF CARE: Admit to inpatient   PATIENT DISPOSITION:  PACU - hemodynamically stable.   Delay start of Pharmacological VTE agent (>24hrs) due to surgical blood loss or risk of bleeding: no

## 2011-11-20 NOTE — Progress Notes (Signed)
ANTIBIOTIC CONSULT NOTE - INITIAL  Pharmacy Consult for gentamicin Indication: s/p I&D contaminated wound after fall  No Known Allergies  Patient Measurements: Height: 5\' 3"  (160 cm) Weight: 184 lb (83.462 kg) IBW/kg (Calculated) : 52.4  Adjusted Body Weight: 65kg  Vital Signs: Temp: 97.5 F (36.4 C) (08/29 0315) Temp src: Oral (08/28 1921) BP: 124/67 mmHg (08/29 0310) Pulse Rate: 74  (08/29 0315) Intake/Output from previous day: 08/28 0701 - 08/29 0700 In: 800 [I.V.:800] Out: 25 [Blood:25] Intake/Output from this shift: Total I/O In: 800 [I.V.:800] Out: 25 [Blood:25]  Labs:  [facility] 11/19/11 2045 11/19/11 2030  WBC 8.6 --  HGB 10.7* --  PLT 153 --  LABCREA -- --  CREATININE -- 2.40*   Estimated Creatinine Clearance: 18.2 ml/min (by C-G formula based on Cr of 2.4). No results found for this basename: VANCOTROUGH:2,VANCOPEAK:2,VANCORANDOM:2,GENTTROUGH:2,GENTPEAK:2,GENTRANDOM:2,TOBRATROUGH:2,TOBRAPEAK:2,TOBRARND:2,AMIKACINPEAK:2,AMIKACINTROU:2,AMIKACIN:2, in the last 72 hours   Microbiology: No results found for this or any previous visit (from the past 720 hour(s)).  Medical History: Past Medical History  Diagnosis Date  . Colon cancer   . Hypertension   . Renal insufficiency, mild   . Anxiety   . Diabetes mellitus     diabetic retinopathy  . Anemia   . Aortic stenosis, mild     Medications:  Prescriptions prior to admission  Medication Sig Dispense Refill  . ALPRAZolam (XANAX) 0.5 MG tablet Take 0.5 mg by mouth 2 (two) times daily as needed. For anxiety      . amitriptyline (ELAVIL) 50 MG tablet Take 50 mg by mouth at bedtime.      Marland Kitchen amLODipine (NORVASC) 10 MG tablet Take 10 mg by mouth daily.      Marland Kitchen apixaban (ELIQUIS) 2.5 MG TABS tablet Take 2.5 mg by mouth 2 (two) times daily.      . carvedilol (COREG) 12.5 MG tablet Take 12.5 mg by mouth 2 (two) times daily with a meal.      . docusate sodium (COLACE) 100 MG capsule Take 100 mg by mouth daily as  needed. For constipation      . furosemide (LASIX) 40 MG tablet Take 1 tablet (40 mg total) by mouth daily.      . insulin glargine (LANTUS) 100 UNIT/ML injection Inject 8 Units into the skin at bedtime.      . sitaGLIPtin (JANUVIA) 25 MG tablet Take 25 mg by mouth daily.      . vitamin B-12 (CYANOCOBALAMIN) 1000 MCG tablet Take 1,000 mcg by mouth daily.       Assessment: 78 yo diabetic lady with contaminated wound after fall to start gentamicin.  Her CrCl ~18 ml/min.  She is also on ancef postop I&D of wound.  Goal of Therapy:  Gentamicin trough level <2 mcg/ml  Plan:  Gentamicin 200 mg X 1. Will check random level after 24 hours for redose. Adjust ancef for renal function. F/u cultures, clinical course and renal function.  Talbert Cage Poteet 11/20/2011,4:04 AM

## 2011-11-20 NOTE — Consult Note (Addendum)
Admit date: 11/19/2011 Referring Physician  Dr.John Luiz Blare Primary Physician  Dr. Marinda Elk, MD Primary Cardiologist  Armanda Magic, MD Reason for Consultation  Unclear reason other than substantiation of cardiac history and treatment plan.  ASSESSMENT: 1. Left elbow laceration requiring irrigation, debridement, and closure  2. Atrial fibrillation since June 2013, with failed cardioversion 09/30/11(recurred after successful conversion). Now being loaded with amiodarone with plan for repeat cardioversion later this summer/early fall.  3. Chronic diastolic heart failure, compensated  4. Diabetes mellitus  5. Hypertension  6. Chronic anticoagulation, now interrupted due to injury  7. New right bundle-branch block  PLAN:  1. Resume Eliquis as soon as safe. Cardioversion cannot be performed until therapeutically anticoagulated for at least 3 weeks unless TEE guided. Will discuss with Dr. Mayford Knife.  2. Resume amiodarone at 300 mg daily.  3. Check 1 set of cardiac markers   HPI: The patient fell and injured her left elbow with a severe laceration. She underwent debridement, urination, and closure last evening. Anticoagulation therapy has been held. Amiodarone therapy was not ordered.. She is in atrial fibrillation with controlled rate and no cardiac symptoms.   PMH:   Past Medical History  Diagnosis Date  . Colon cancer   . Hypertension   . Renal insufficiency, mild   . Anxiety   . Diabetes mellitus     diabetic retinopathy  . Anemia   . Aortic stenosis, mild      PSH:   Past Surgical History  Procedure Date  . Abdominal surgery   . Abdominal hysterectomy   . Colon surgery   . Appendectomy   . Colostomy   . Cardioversion 09/30/2011    Procedure: CARDIOVERSION;  Surgeon: Quintella Reichert, MD;  Location: [facility] OR;  Service: Cardiovascular;  Laterality: N/A;    Allergies:  Review of patient's allergies indicates no known allergies. Prior to Admit Meds:   Prescriptions prior  to admission  Medication Sig Dispense Refill  . ALPRAZolam (XANAX) 0.5 MG tablet Take 0.5 mg by mouth 2 (two) times daily as needed. For anxiety      . amitriptyline (ELAVIL) 50 MG tablet Take 50 mg by mouth at bedtime.      Marland Kitchen amLODipine (NORVASC) 10 MG tablet Take 10 mg by mouth daily.      Marland Kitchen apixaban (ELIQUIS) 2.5 MG TABS tablet Take 2.5 mg by mouth 2 (two) times daily.      . carvedilol (COREG) 12.5 MG tablet Take 12.5 mg by mouth 2 (two) times daily with a meal.      . docusate sodium (COLACE) 100 MG capsule Take 100 mg by mouth daily as needed. For constipation      . furosemide (LASIX) 40 MG tablet Take 1 tablet (40 mg total) by mouth daily.      . insulin glargine (LANTUS) 100 UNIT/ML injection Inject 8 Units into the skin at bedtime.      . sitaGLIPtin (JANUVIA) 25 MG tablet Take 25 mg by mouth daily.      . vitamin B-12 (CYANOCOBALAMIN) 1000 MCG tablet Take 1,000 mcg by mouth daily.       Fam HX:   History reviewed. No pertinent family history. Social HX:    History   Social History  . Marital Status: Widowed    Spouse Name: N/A    Number of Children: N/A  . Years of Education: N/A   Occupational History  . Not on file.   Social History Main Topics  . Smoking  status: Never Smoker   . Smokeless tobacco: Current User  . Alcohol Use: No  . Drug Use: No  . Sexually Active:    Other Topics Concern  . Not on file   Social History Narrative  . No narrative on file     Review of Systems: No significant complaints other than left elbow discomfort she specifically denies dyspnea, orthopnea, and chest pain. There is been no bleeding.  Physical Exam: Blood pressure 132/72, pulse 72, temperature 97.2 F (36.2 C), temperature source Oral, resp. rate 18, height 5\' 3"  (1.6 m), weight 83.462 kg (184 lb), SpO2 94.00%. Weight change:   Skin is clear and without pallor.  No JVD or carotid bruits are noted.  The lungs are clear anteriorly and posteriorly. No wheezing or rales  are heard.  81-2 of 6 systolic murmur is heard at the right upper sternal border without a diastolic murmur. No gallop is heard. The rhythm is irregularly irregular.  Extremities reveal no edema.  Neurological exam is unremarkable .  Labs:   Lab Results  Component Value Date   WBC 8.6 11/19/2011   HGB 10.7* 11/19/2011   HCT 33.0* 11/19/2011   MCV 90.2 11/19/2011   PLT 153 11/19/2011    Lab 11/20/11 1208  NA 135  K 4.1  CL 101  CO2 23  BUN 27*  CREATININE 2.13*  CALCIUM 8.8  PROT 6.5  BILITOT 0.2*  ALKPHOS 119*  ALT <5  AST 13  GLUCOSE 108*   No results found for this basename: PTT   Lab Results  Component Value Date   INR 1.36 11/19/2011   Lab Results  Component Value Date   TROPONINI <0.30 08/11/2011     Radiology:  Dg Elbow Complete Left  11/19/2011  *RADIOLOGY REPORT*  Clinical Data: Left elbow pain.  Multiple laceration and tears after a fall.  LEFT ELBOW - COMPLETE 3+ VIEW  Comparison: Left forearm radiographs 01/18/2010.  Findings: The left elbow is located.  There is no significant effusion.  A large soft tissue laceration is seen posterior to the left elbow.  Radiodense material may represent a foreign body or treatment artifact.  IMPRESSION:  1.  Large soft tissue laceration posterior to the left elbow. 2.  No acute fracture or joint effusion.   Original Report Authenticated By: Jamesetta Orleans. MATTERN, M.D.    Ct Head Wo Contrast  11/19/2011  *RADIOLOGY REPORT*  Clinical Data: Fall.  Headache.  CT HEAD WITHOUT CONTRAST  Technique:  Contiguous axial images were obtained from the base of the skull through the vertex without contrast.  Comparison: None.  Findings: Encephalomalacia of the right temporal lobe is compatible with a remote right MCA territory infarct.  Ex vacuo dilation is noted in the right lateral ventricle.  There is a remote lacunar infarct of the right external capsule.  Remote lacunar infarcts are present in the thalami bilaterally.  A focus of  hypoattenuation in the right corona radiata likely represents remote infarcts as well, but is more agent termini.  Atrophy and subcortical white matter disease bilaterally is compatible with chronic microvascular ischemic change.  No hemorrhage or mass lesion is present.  The ventricles are proportionate to the degree of atrophy.  No significant extra-axial fluid collection is present.  The paranasal sinuses and mastoid air cells are clear.  The osseous skull is intact.  Atherosclerotic calcifications are present in the cavernous carotid arteries bilaterally. No significant extra calvarial soft tissue injury is evident.  IMPRESSION:  1.  Remote posterior right MCA territory infarct. 2.  Remote lacunar infarcts of the basal ganglia bilaterally. 3.  Age indeterminate infarct of the right corona radiata is likely remote as well. 4.  Atrophy and diffuse white matter disease likely reflects the sequelae of chronic microvascular ischemia. 5.  Atherosclerosis.   Original Report Authenticated By: Jamesetta Orleans. MATTERN, M.D.    Dg Chest Port 1 View  11/20/2011  *RADIOLOGY REPORT*  Clinical Data: Preoperative examination.  PORTABLE CHEST - 1 VIEW  Comparison: Portable chest 08/27/2011.  CT chest 09/16/2006.  Findings: Mild cardiomegaly and pulmonary vascular congestion appear unchanged.  There is no consolidative process, pneumothorax or effusion with subsegmental atelectasis in the left base identified.  IMPRESSION: Cardiomegaly and mild vascular congestion.   Original Report Authenticated By: Bernadene Bell. D'ALESSIO, M.D.    Dg Knee Complete 4 Views Left  11/19/2011  *RADIOLOGY REPORT*  Clinical Data: Left knee pain.  Fall.  LEFT KNEE - COMPLETE 4+ VIEW  Comparison: Left knee radiographs 01/18/2010.  Findings: Left knee is located.  No acute bone or soft tissue abnormalities present.  There is no definite effusion. Mild osteopenia is evident.  Degenerative changes are again noted.  IMPRESSION: No acute abnormality.    Original Report Authenticated By: Jamesetta Orleans. MATTERN, M.D.    EKG:  New right bundle branch block, atrial fibrillation with moderate rate control to  ECHO: 09/19/11 Conclusions: 1. Moderate concentric left ventricular hypertrophy. 2. Left ventricular ejection fraction estimated by 2D at 54.5 % percent 3. Moderate left atrial enlargement. 4. Mildly thickened mitral valve with calcified anterior MV leaflet. 5. Mitral valve is moderately sclerotic. 6. Mild mitral annular calcification. 7. Trace mitral valve regurgitation. 8. The aortic valve is sclerotic but opens well. 9. Moderate calcification of the aortic valve. 10. Mild aortic valve stenosis. 11. There is mild aortic root dilatation. 12. There is mild tricuspid regurgitation. 13. Analysis of mitral valve inflow, pulmonary vein Doppler and tissue Doppler suggests grade II diastolic dysfunction with elevated left atrial pressure. Lesleigh Noe 11/20/2011 2:02 PM

## 2011-11-20 NOTE — Preoperative (Signed)
Beta Blockers   Reason not to administer Beta Blockers:B blocker taken 11/19/11 in am

## 2011-11-20 NOTE — Op Note (Signed)
NAMEALISSANDRA, GEOFFROY                  ACCOUNT NO.:  [id number]  MEDICAL RECORD NO.:  [id number]  LOCATION:  [id number]                        FACILITY:  [facility]  PHYSICIAN:  Harvie Junior, M.D.   DATE OF BIRTH:  [birth year]  DATE OF PROCEDURE:  11/20/2011 DATE OF DISCHARGE:                              OPERATIVE REPORT   PREOPERATIVE DIAGNOSIS:  Complex open wound with gross contamination of left elbow.  POSTOPERATIVE DIAGNOSIS:  Complex open wound with gross contamination of left elbow.  PRINCIPAL PROCEDURE: 1. Excisional debridement of skin, subcutaneous tissue, fascia. 2. Excision of olecranon bursa, left. 3. Complex closure of open contaminated wound, left elbow.  SURGEON:  Harvie Junior, M.D.  ASSISTANT:  Marshia Ly, PA  ANESTHESIA:  General.  BRIEF HISTORY:  Ms. Kirn is a 78 year old female, who fell on her left elbow.  She was seen at [facility] where they did irrigation and debridement at the bedside.  We felt there was gross contamination. They were unable to get out and because of concerns of her diabetes and wound issues, they ultimately consulted [place] and I felt that she probably needed irrigation and debridement in the operating room.  She was brought to the operating room for that procedure.  PROCEDURE:  The patient was brought to the operating room and after adequate level of anesthesia was obtained with general anesthetic, the patient was placed supine on operating table.  The left elbow wound was then examined under anesthesia, was noted to have really a stellate complex area where the wound was really just bead up badly.  At that point, I felt that probably the best bet for her would be excision of this entire area and attempt to gain closure with raising flaps on the elbow and this is what was chosen to be done.  So we did kind of a football shaped incision ellipsing the entire tenuous area of skin and basically did debridement of skin and  subcutaneous tissue, fascia, and muscle from the elbow.  We then did a olecranon bursectomy and then we raised flaps around the medial side and lateral side.  I irrigated this thoroughly with the pulsatile lavage irrigator and got all the contamination that we could get out.  We then closed the wound with a combination of nylon and staples and put some retention sutures in the central area of the wound, although ultimately they were not under dramatic tension.  We did place a Penrose drain and then a sterile compressive dressing and a posterior splint with the arm at about 60 degrees of flexion.  At that point, she was taken to the recovery room.  She was noted to be in satisfactory condition.  Estimated blood loss for procedure was none. Total tourniquet time was approximately 30 minutes, although the final time we got from her OR record.     Harvie Junior, M.D.     Ranae Plumber  D:  11/20/2011  T:  11/20/2011  Job:  [id number]

## 2011-11-20 NOTE — Anesthesia Procedure Notes (Signed)
Procedure Name: Intubation Date/Time: 11/20/2011 1:37 AM Performed by: Rossie Muskrat L Pre-anesthesia Checklist: Patient identified, Patient being monitored, Timeout performed, Emergency Drugs available and Suction available Patient Re-evaluated:Patient Re-evaluated prior to inductionOxygen Delivery Method: Circle system utilized Preoxygenation: Pre-oxygenation with 100% oxygen Intubation Type: IV induction, Rapid sequence and Cricoid Pressure applied Ventilation: Mask ventilation without difficulty Laryngoscope Size: Miller and 2 Grade View: Grade I Tube type: Oral Tube size: 7.5 mm Number of attempts: 1 Airway Equipment and Method: Stylet Placement Confirmation: ETT inserted through vocal cords under direct vision,  breath sounds checked- equal and bilateral and positive ETCO2 Secured at: 20 cm Tube secured with: Tape Dental Injury: Teeth and Oropharynx as per pre-operative assessment

## 2011-11-20 NOTE — Transfer of Care (Signed)
Immediate Anesthesia Transfer of Care Note  Patient: [name]  Procedure(s) Performed: Procedure(s) (LRB): IRRIGATION AND DEBRIDEMENT EXTREMITY (Left)  Patient Location: PACU  Anesthesia Type: General  Level of Consciousness: awake, alert  and oriented  Airway & Oxygen Therapy: Patient Spontanous Breathing and Patient connected to nasal cannula oxygen  Post-op Assessment: Report given to PACU RN, Post -op Vital signs reviewed and stable and Patient moving all extremities  Post vital signs: Reviewed and stable  Complications: No apparent anesthesia complications

## 2011-11-20 NOTE — Evaluation (Signed)
Physical Therapy Evaluation Patient Details Name: [name] MRN: [id number] DOB: June 24, 1925 Today's Date: 11/20/2011 Time: 3086-5784 PT Time Calculation (min): 24 min  PT Assessment / Plan / Recommendation Clinical Impression  Pt reported feeling dizzy during session. Pt did not eat any of her breakfast and unable to eat her lunch because her daughter took her teeth home. RN to address pt's meal. Acute PT to follow pt to maximize independence with mobility.     PT Assessment  Patient needs continued PT services    Follow Up Recommendations  Home health PT;Supervision/Assistance - 24 hour    Barriers to Discharge None Pt reports her daughter will be staying with her for a while.     Equipment Recommendations  Other (comment) (Hemi walker. )    Recommendations for Other Services     Frequency Min 5X/week    Precautions / Restrictions Precautions Precautions: Fall Required Braces or Orthoses: Other Brace/Splint Other Brace/Splint: shoulder sling  Restrictions Weight Bearing Restrictions: Yes LUE Weight Bearing: Non weight bearing   Pertinent Vitals/Pain Pt c/o pain in L shoulder and L knee when moving.  5/10 in both no pain medicine required per pt.        Mobility  Bed Mobility Bed Mobility: Supine to Sit Supine to Sit: 4: Min assist;HOB elevated Details for Bed Mobility Assistance: Cueing for technique assist to raise shoulders from bed secondary to L UE pain.   Transfers Transfers: Sit to Stand;Stand to Sit Sit to Stand: 4: Min assist;From bed;With upper extremity assist Stand to Sit: 4: Min assist;To chair/3-in-1;Without upper extremity assist Details for Transfer Assistance: Cueing for Right hand placement.  Assist to steady pt during standing secondary to Pain in L knee.  Ambulation/Gait Ambulation/Gait Assistance: 4: Min assist Ambulation Distance (Feet): 20 Feet Assistive device: 1 person hand held assist Ambulation/Gait Assistance Details: Cueing for gait  sequencing HHA to steady pt due to L LE pain and weakness.  Gait Pattern: Step-to pattern;Decreased stride length;Decreased weight shift to left;Decreased hip/knee flexion - left Stairs: No Wheelchair Mobility Wheelchair Mobility: No    Exercises Total Joint Exercises Ankle Circles/Pumps: AROM;Both;10 reps;Seated Quad Sets: Left;5 reps;Seated Heel Slides: 5 reps;Supine;AAROM;Left Long Arc Quad: Left;5 reps;Seated   PT Diagnosis: Difficulty walking;Generalized weakness;Acute pain  PT Problem List: Decreased mobility;Decreased balance;Decreased knowledge of use of DME;Pain PT Treatment Interventions: Gait training;DME instruction;Stair training;Functional mobility training;Therapeutic activities;Therapeutic exercise;Balance training;Patient/family education   PT Goals Acute Rehab PT Goals PT Goal Formulation: With patient Time For Goal Achievement: 11/27/11 Potential to Achieve Goals: Good Pt will go Supine/Side to Sit: with supervision;with HOB not 0 degrees (comment degree) PT Goal: Supine/Side to Sit - Progress: Goal set today Pt will go Sit to Supine/Side: with supervision;with HOB not 0 degrees (comment degree) PT Goal: Sit to Supine/Side - Progress: Goal set today Pt will go Sit to Stand: with supervision PT Goal: Sit to Stand - Progress: Goal set today Pt will go Stand to Sit: with supervision PT Goal: Stand to Sit - Progress: Goal set today Pt will Transfer Bed to Chair/Chair to Bed: with supervision PT Transfer Goal: Bed to Chair/Chair to Bed - Progress: Goal set today Pt will Ambulate: 51 - 150 feet;with supervision;with other equipment (comment) (Hemi walker.) PT Goal: Ambulate - Progress: Goal set today Pt will Go Up / Down Stairs: 1-2 stairs;with min assist;with least restrictive assistive device PT Goal: Up/Down Stairs - Progress: Goal set today  Visit Information  Last PT Received On: 11/20/11 Assistance Needed: +1  Subjective Data  Subjective: Agree to PT eval   Patient Stated Goal: Return to home   Prior Functioning  Home Living Lives With: Alone (Will have family staying with her upon d/c) Available Help at Discharge: Family Type of Home: House Home Access: Ramped entrance Home Layout: One level Bathroom Shower/Tub: Engineer, manufacturing systems: Handicapped height Bathroom Accessibility: Yes How Accessible: Accessible via wheelchair;Accessible via walker Home Adaptive Equipment: Bedside commode/3-in-1;Tub transfer bench;Walker - rolling;Straight cane Prior Function Level of Independence: Independent Able to Take Stairs?: Yes Vocation: Retired Musician: No difficulties Dominant Hand: Right    Cognition  Overall Cognitive Status: Appears within functional limits for tasks assessed/performed Arousal/Alertness: Awake/alert Orientation Level: Appears intact for tasks assessed Behavior During Session: [facility] for tasks performed    Extremity/Trunk Assessment Right Upper Extremity Assessment RUE ROM/Strength/Tone: [facility] for tasks assessed Left Upper Extremity Assessment LUE ROM/Strength/Tone: Unable to fully assess (Arm in sling) Right Lower Extremity Assessment RLE ROM/Strength/Tone: Within functional levels RLE Coordination: WFL - gross motor Left Lower Extremity Assessment LLE ROM/Strength/Tone: Deficits LLE ROM/Strength/Tone Deficits: ROM and stength in L knee limited secondary to pain. Pt injured knee during  most recent fall. Trunk Assessment Trunk Assessment: Normal   Balance Balance Balance Assessed: Yes Static Standing Balance Static Standing - Balance Support: Right upper extremity supported Static Standing - Level of Assistance: 4: Min assist Static Standing - Comment/# of Minutes: 1 minute standing with HHA to steady pt.   End of Session PT - End of Session Equipment Utilized During Treatment: Gait belt Activity Tolerance: Patient limited by fatigue (Pt c/o feeling dizzy while ambulating. ) Patient  left: in chair;with call bell/phone within reach Nurse Communication: Mobility status  GP     Shanyiah Conde 11/20/2011, 1:02 PM  Shondrea Steinert L. Tawana Pasch DPT [phone]

## 2011-11-20 NOTE — Progress Notes (Signed)
Subjective: Day of Surgery Procedure(s) (LRB): IRRIGATION AND DEBRIDEMENT EXTREMITY (Left) Patient reports pain as 2 on 0-10 scale.   Sees Dr Armanda Magic at [facility] and is scheduled for cardioversion next Fri for Afib.she was on Eliquis 2.5 mg BID prior to admission. No cardiac complaints.  No shortness of breath.  Objective: Vital signs in last 24 hours: Temp:  [97.2 F (36.2 C)-98 F (36.7 C)] 97.2 F (36.2 C) (08/29 0526) Pulse Rate:  [72-94] 72  (08/29 0526) Resp:  [15-18] 18  (08/29 0526) BP: (112-147)/(67-76) 132/72 mmHg (08/29 0526) SpO2:  [94 %-100 %] 94 % (08/29 0526) Weight:  [83.462 kg (184 lb)] 83.462 kg (184 lb) (08/28 1921)  Intake/Output from previous day: 08/28 0701 - 08/29 0700 In: 800 [I.V.:800] Out: 25 [Blood:25] Intake/Output this shift:     [facility] 11/19/11 2045  HGB 10.7*    Basename 11/19/11 2045  WBC 8.6  RBC 3.66*  HCT 33.0*  PLT 153    Basename 11/19/11 2030  NA 135  K 4.1  CL 99  CO2 26  BUN 32*  CREATININE 2.40*  GLUCOSE 114*  CALCIUM 9.1    Basename 11/19/11 2030  LABPT --  INR 1.36  CBC/BMET pending from today. Left elbow exam: Long-arm fiberglass splint is intact.  She moves all fingers actively.  Normal sensation in her fingers.  Sling intact to left upper extremity.    Assessment/Plan: Day of Surgery Procedure(s) (LRB): IRRIGATION AND DEBRIDEMENT EXTREMITY (Left) Atrial fibrillation.  Followed by Dr. Mayford Knife. Plan: Will get a cardiology consult related to her blood thinners.  We are okay from an orthopedic viewpoint to resume her preop blood thinners later today. We just want to make sure that there are no contraindications to moving forward with her procedure on her heart next week as we held her medication yesterday evening. She'll continue on IV gentamicin and Ancef and we will consider discharging her home later today on oral antibiotics. PT consult has been ordered for  Evaluation of her stability  related to ambulating.she may need home health physical therapy.?   Mckinnon Glick G 11/20/2011, 8:44 AM

## 2011-11-20 NOTE — ED Notes (Signed)
Dr Luiz Blare called and spoke to Dr Alto Denver requesting CXR and EKG. MD made aware that pt was already in transport at this time. Spoke with Cletis Athens, RN at [facility] who is aware of plan of care and need for CXR and EKG once pt arrives at facility.

## 2011-11-20 NOTE — Progress Notes (Signed)
UR COMPLETED  

## 2011-11-21 ENCOUNTER — Encounter (HOSPITAL_COMMUNITY): Payer: Self-pay | Admitting: Orthopedic Surgery

## 2011-11-21 LAB — GENTAMICIN LEVEL, RANDOM: Gentamicin Rm: 2.3 ug/mL

## 2011-11-21 LAB — GLUCOSE, CAPILLARY: Glucose-Capillary: 117 mg/dL — ABNORMAL HIGH (ref 70–99)

## 2011-11-21 MED ORDER — AMIODARONE HCL 300 MG PO TABS: 300.0000 mg | ORAL_TABLET | Freq: Every day | ORAL | Status: DC

## 2011-11-21 MED ORDER — GENTAMICIN IN SALINE 1-0.9 MG/ML-% IV SOLN
100.0000 mg | INTRAVENOUS | Status: DC
  Filled 2011-11-21: qty 100

## 2011-11-21 MED ORDER — CEPHALEXIN 500 MG PO CAPS: 500.0000 mg | ORAL_CAPSULE | Freq: Three times a day (TID) | ORAL | Status: AC

## 2011-11-21 NOTE — Progress Notes (Signed)
Physical Therapy Treatment Patient Details Name: [name] MRN: [id number] DOB: 23-Aug-1925 Today's Date: 11/21/2011 Time: 1478-2956 PT Time Calculation (min): 13 min  PT Assessment / Plan / Recommendation Comments on Treatment Session  Pt presents with nausea and vomitting throughout seesion.  Unable to complete session.     Follow Up Recommendations  Home health PT;Supervision/Assistance - 24 hour    Barriers to Discharge        Equipment Recommendations       Recommendations for Other Services    Frequency Min 5X/week   Plan Discharge plan remains appropriate;Frequency remains appropriate    Precautions / Restrictions Precautions Precautions: Fall Required Braces or Orthoses: Other Brace/Splint Other Brace/Splint: shoulder sling  Restrictions Weight Bearing Restrictions: Yes LUE Weight Bearing: Non weight bearing   Pertinent Vitals/Pain 5/10 L shoulder.  No intervention required per pt.     Mobility  Bed Mobility Bed Mobility: Supine to Sit Supine to Sit: 5: Supervision Details for Bed Mobility Assistance: Supervision for safety Transfers Transfers: Sit to Stand;Stand to Sit;Stand Pivot Transfers Sit to Stand: 5: Supervision;From bed Stand to Sit: 5: Supervision;To chair/3-in-1 Stand Pivot Transfers: 5: Supervision Details for Transfer Assistance: Cueing for use of Hemi walker.  Ambulation/Gait Ambulation/Gait Assistance: Not tested (comment) Stairs: No Wheelchair Mobility Wheelchair Mobility: No    Exercises     PT Diagnosis:    PT Problem List:   PT Treatment Interventions:     PT Goals Acute Rehab PT Goals PT Goal Formulation: With patient Time For Goal Achievement: 11/27/11 Potential to Achieve Goals: Good Pt will go Supine/Side to Sit: with supervision;with HOB not 0 degrees (comment degree) PT Goal: Supine/Side to Sit - Progress: Met Pt will go Sit to Supine/Side: with supervision;with HOB not 0 degrees (comment degree) PT Goal: Sit to  Supine/Side - Progress: Met Pt will go Sit to Stand: with supervision PT Goal: Sit to Stand - Progress: Met Pt will go Stand to Sit: with supervision PT Goal: Stand to Sit - Progress: Met Pt will Transfer Bed to Chair/Chair to Bed: with supervision PT Transfer Goal: Bed to Chair/Chair to Bed - Progress: Met Pt will Ambulate: 51 - 150 feet;with supervision;with other equipment (comment) PT Goal: Ambulate - Progress: Not met Pt will Go Up / Down Stairs: 1-2 stairs;with min assist;with least restrictive assistive device PT Goal: Up/Down Stairs - Progress: Not met  Visit Information  Last PT Received On: 11/21/11    Subjective Data  Subjective: I feel sick Patient Stated Goal: return to home   Cognition  Overall Cognitive Status: Appears within functional limits for tasks assessed/performed Arousal/Alertness: Awake/alert Orientation Level: Appears intact for tasks assessed Behavior During Session: [facility] for tasks performed    Balance  Balance Balance Assessed: No  End of Session PT - End of Session Equipment Utilized During Treatment: Gait belt Activity Tolerance: Treatment limited secondary to medical complications (Comment) (Nausea and vomitting throughout session. ) Patient left: in chair;with call bell/phone within reach;with nursing in room Nurse Communication: Mobility status   GP     [name] 11/21/2011, 12:15 PM [name] L. [name] DPT [phone]

## 2011-11-21 NOTE — Progress Notes (Signed)
SUBJECTIVE:  Feeling better going home today.  Larey Seat while trying to get a leash off her dog and fell backwards.  OBJECTIVE:   Vitals:   Filed Vitals:   11/20/11 1400 11/20/11 2235 11/21/11 0559 11/21/11 1100  BP: 137/67 138/68 140/64 127/74  Pulse: 89 90 89   Temp: 98.3 F (36.8 C) 98.4 F (36.9 C) 98.6 F (37 C)   TempSrc:      Resp: 18 18 18    Height:      Weight:      SpO2: 92% 94% 95%    I&O's:   Intake/Output Summary (Last 24 hours) at 11/21/11 1302 Last data filed at 11/21/11 0700  Gross per 24 hour  Intake    360 ml  Output      0 ml  Net    360 ml     PHYSICAL EXAM General: Well developed, well nourished, in no acute distress Head: Eyes PERRLA, No xanthomas.   Normal cephalic and atramatic  Lungs:   Clear bilaterally to auscultation and percussion. Heart:   Irregularly irregular S1 S2 Pulses are 2+ & equal. Abdomen: Bowel sounds are positive, abdomen soft and non-tender without masses  Extremities:   No clubbing, cyanosis or edema.  DP +1 Neuro: Alert and oriented X 3. Psych:  Good affect, responds appropriately   LABS: Basic Metabolic Panel:  Basename 11/20/11 1208 11/19/11 2030  NA 135 135  K 4.1 4.1  CL 101 99  CO2 23 26  GLUCOSE 108* 114*  BUN 27* 32*  CREATININE 2.13* 2.40*  CALCIUM 8.8 9.1  MG -- --  PHOS -- --   Liver Function Tests:  [facility] 11/20/11 1208  AST 13  ALT <5  ALKPHOS 119*  BILITOT 0.2*  PROT 6.5  ALBUMIN 3.0*   No results found for this basename: LIPASE:2,AMYLASE:2 in the last 72 hours CBC:  Basename 11/19/11 2045  WBC 8.6  NEUTROABS 6.4  HGB 10.7*  HCT 33.0*  MCV 90.2  PLT 153   Cardiac Enzymes:  Basename 11/20/11 1544  CKTOTAL --  CKMB --  CKMBINDEX --  TROPONINI <0.30   Coag Panel:   Lab Results  Component Value Date   INR 1.36 11/19/2011    RADIOLOGY: Dg Elbow Complete Left  11/19/2011  *RADIOLOGY REPORT*  Clinical Data: Left elbow pain.  Multiple laceration and tears after a fall.  LEFT ELBOW -  COMPLETE 3+ VIEW  Comparison: Left forearm radiographs 01/18/2010.  Findings: The left elbow is located.  There is no significant effusion.  A large soft tissue laceration is seen posterior to the left elbow.  Radiodense material may represent a foreign body or treatment artifact.  IMPRESSION:  1.  Large soft tissue laceration posterior to the left elbow. 2.  No acute fracture or joint effusion.   Original Report Authenticated By: Jamesetta Orleans. MATTERN, M.D.    Ct Head Wo Contrast  11/19/2011  *RADIOLOGY REPORT*  Clinical Data: Fall.  Headache.  CT HEAD WITHOUT CONTRAST  Technique:  Contiguous axial images were obtained from the base of the skull through the vertex without contrast.  Comparison: None.  Findings: Encephalomalacia of the right temporal lobe is compatible with a remote right MCA territory infarct.  Ex vacuo dilation is noted in the right lateral ventricle.  There is a remote lacunar infarct of the right external capsule.  Remote lacunar infarcts are present in the thalami bilaterally.  A focus of hypoattenuation in the right corona radiata likely represents remote infarcts as  well, but is more agent termini.  Atrophy and subcortical white matter disease bilaterally is compatible with chronic microvascular ischemic change.  No hemorrhage or mass lesion is present.  The ventricles are proportionate to the degree of atrophy.  No significant extra-axial fluid collection is present.  The paranasal sinuses and mastoid air cells are clear.  The osseous skull is intact.  Atherosclerotic calcifications are present in the cavernous carotid arteries bilaterally. No significant extra calvarial soft tissue injury is evident.  IMPRESSION:  1.  Remote posterior right MCA territory infarct. 2.  Remote lacunar infarcts of the basal ganglia bilaterally. 3.  Age indeterminate infarct of the right corona radiata is likely remote as well. 4.  Atrophy and diffuse white matter disease likely reflects the sequelae of  chronic microvascular ischemia. 5.  Atherosclerosis.   Original Report Authenticated By: Jamesetta Orleans. MATTERN, M.D.    Mr Lumbar Spine Wo Contrast  11/08/2011  *RADIOLOGY REPORT*  Clinical Data: 78 year old female with low back pain radiating to the tail bone.  Lower extremity weakness.  Difficulty walking. History of colon cancer/resection.  MRI LUMBAR SPINE WITHOUT CONTRAST  Technique:  Multiplanar and multiecho pulse sequences of the lumbar spine were obtained without intravenous contrast.  Comparison: CT abdomen and pelvis 10/14/2009 and earlier.  [facility] lumbar radiographs 04/18/2009.  Findings: Normal lumbar segmentation depicted on comparison. Stable lumbar vertebral height and alignment and 2011.  The inferior endplate Schmorl's node at L4 with no associated marrow edema.  Occasional benign vertebral body hemangiomas (L5 on the left). No marrow edema or evidence of acute osseous abnormality. The coccyx is not included, but the visualized sacrum appears normal.   Visualized lower thoracic spinal cord is normal with conus medularis at L1.  Stable and negative for age visualized abdominal viscera. Visualized paraspinal soft tissues are within normal limits.  T11-T12:  Minor disc bulge.  No significant stenosis.  T12-L1:  Small or mild left lateral recess broad-based disc protrusion.  No significant stenosis.  L1-L2:  Right anterior disc osteophyte complex, otherwise negative.  L2-L3:  Moderate circumferential disc bulge with mild endplate spurring.  Moderate facet and ligament flavum hypertrophy. Moderate to severe subsequent spinal stenosis.  Mild lateral recess and no significant foraminal stenosis.  L3-L4:  Moderate to severe circumferential disc osteophyte complex. Severe facet and ligament flavum hypertrophy greater on the right. Severe spinal stenosis.  Moderate to severe right and moderate left lateral recess stenosis.  Mild right greater than left L3 foraminal stenosis.  L4-L5:   Moderate circumferential disc bulge.  Severe facet and ligament flavum hypertrophy.  Severe spinal and moderate severe bilateral lateral recess stenosis.  Mild to moderate left L4 foraminal stenosis.  L5-S1:  Moderate circumferential disc osteophyte complex, maximal laterally on the right.  Moderate facet and ligament flavum hypertrophy greater on the left.  No significant spinal stenosis. Mild to moderate left lateral recess and left L5 foraminal stenosis.  IMPRESSION: 1.  Multifactorial moderate and severe spinal and lateral recess stenosis at L2-L3, L3-L4 (appears maximal), and L4-L5. 2.  No acute lumbar osseous abnormality.  Chronic L4 inferior endplate Schmorl's node.  Original Report Authenticated By: Harley Hallmark, M.D.   Dg Chest Port 1 View  11/20/2011  *RADIOLOGY REPORT*  Clinical Data: Preoperative examination.  PORTABLE CHEST - 1 VIEW  Comparison: Portable chest 08/27/2011.  CT chest 09/16/2006.  Findings: Mild cardiomegaly and pulmonary vascular congestion appear unchanged.  There is no consolidative process, pneumothorax or effusion with subsegmental atelectasis in the  left base identified.  IMPRESSION: Cardiomegaly and mild vascular congestion.   Original Report Authenticated By: Bernadene Bell. D'ALESSIO, M.D.    Dg Knee Complete 4 Views Left  11/19/2011  *RADIOLOGY REPORT*  Clinical Data: Left knee pain.  Fall.  LEFT KNEE - COMPLETE 4+ VIEW  Comparison: Left knee radiographs 01/18/2010.  Findings: Left knee is located.  No acute bone or soft tissue abnormalities present.  There is no definite effusion. Mild osteopenia is evident.  Degenerative changes are again noted.  IMPRESSION: No acute abnormality.   Original Report Authenticated By: Jamesetta Orleans. MATTERN, M.D.       ASSESSMENT:  1.  Atrial fibrillation now on amiodarone load.  Her Eliquis has been held due to her arm.  Will restart when ok with ortho 2.  HTN controlled 3.  Chronic diastolic CHF compensated 4.  DM 5.  RBBB 6.  Left  elbow laceration s/p debridement and closure  PLAN:   1.  Continue Amiodarone 2.  Restart anticoagulants when ok with surgery 3.  Followup with me in the office in 2 weeks September 13th at 1:45pm  Quintella Reichert, MD  11/21/2011  1:02 PM

## 2011-11-21 NOTE — Progress Notes (Signed)
Subjective: 1 Day Post-Op Procedure(s) (LRB): IRRIGATION AND DEBRIDEMENT EXTREMITY (Left) Patient reports pain as 1 on 0-10 scale.   Some nausea this am relieved with iv zofran.  Objective: Vital signs in last 24 hours: Temp:  [98.3 F (36.8 C)-98.6 F (37 C)] 98.6 F (37 C) (08/30 0559) Pulse Rate:  [89-90] 89  (08/30 0559) Resp:  [18] 18  (08/30 0559) BP: (127-140)/(64-74) 127/74 mmHg (08/30 1100) SpO2:  [92 %-95 %] 95 % (08/30 0559)  Intake/Output from previous day: 08/29 0701 - 08/30 0700 In: 480 [P.O.:480] Out: -  Intake/Output this shift:     Basename 11/19/11 2045  HGB 10.7*    Basename 11/19/11 2045  WBC 8.6  RBC 3.66*  HCT 33.0*  PLT 153    Basename 11/20/11 1208 11/19/11 2030  NA 135 135  K 4.1 4.1  CL 101 99  CO2 23 26  BUN 27* 32*  CREATININE 2.13* 2.40*  GLUCOSE 108* 114*  CALCIUM 8.8 9.1    Basename 11/19/11 2030  LABPT --  INR 1.36  Left elbow exam: Posterior splint intact . Moves fingers actively.  Neurovascular intact Sensation intact distally  Assessment/Plan: 1 Day Post-Op Procedure(s) (LRB): IRRIGATION AND DEBRIDEMENT EXTREMITY (Left) Plan: D/C home. D/C IV. F/u Dr Luiz Blare 6 days. D/C IV fluids  Hetal Proano G 11/21/2011, 11:49 AM

## 2011-11-21 NOTE — Discharge Summary (Signed)
Patient ID: [name] MRN: [id number] DOB/AGE: 1925/12/24 78 y.o.  Admit date: 11/19/2011 Discharge date: 11/21/2011  Admission Diagnoses: Left elbow Principal Problem:  *Open wound of elbow, complicated   Discharge Diagnoses:  Same  Past Medical History  Diagnosis Date  . Colon cancer   . Hypertension   . Renal insufficiency, mild   . Anxiety   . Diabetes mellitus     diabetic retinopathy  . Anemia   . Aortic stenosis, mild     Surgeries: Procedure(s):Left elbow IRRIGATION AND DEBRIDEMENT EXTREMITY on 11/19/2011 - 11/20/2011   Consultants: Treatment Team:  Quintella Reichert, MD  Discharged Condition: Improved  Hospital Course: [name] is an 78 y.o. female who was admitted 11/19/2011 for operative treatment ofOpen wound of elbow, complicated. Patient has severe unremitting pain that affects sleep, daily activities, and work/hobbies. After pre-op clearance the patient was taken to the operating room on 11/19/2011 - 11/20/2011 and underwent  Procedure(s):Left elbow IRRIGATION AND DEBRIDEMENT EXTREMITY.  With large deep wound closure  Patient was given perioperative antibiotics: Anti-infectives     Start     Dose/Rate Route Frequency Ordered Stop   11/21/11 2200   gentamicin (GARAMYCIN) IVPB 100 mg        100 mg 200 mL/hr over 30 Minutes Intravenous Every 24 hours 11/21/11 1023     11/21/11 0000   cephALEXin (KEFLEX) 500 MG capsule        500 mg Oral 3 times daily 11/21/11 1153 12/01/11 2359   11/20/11 0600   ceFAZolin (ANCEF) IVPB 2 g/50 mL premix  Status:  Discontinued        2 g 100 mL/hr over 30 Minutes Intravenous 3 times per day 11/20/11 0344 11/20/11 0401   11/20/11 0600   ceFAZolin (ANCEF) IVPB 2 g/50 mL premix        2 g 100 mL/hr over 30 Minutes Intravenous Every 12 hours 11/20/11 0401 11/22/11 0559   11/20/11 0415   gentamicin (GARAMYCIN) 200 mg in dextrose 5 % 50 mL IVPB        200 mg 110 mL/hr over 30 Minutes Intravenous  Once 11/20/11 0403 11/20/11  0524   11/20/11 0204   polymyxin B 500,000 Units, bacitracin 50,000 Units in sodium chloride irrigation 0.9 % 500 mL irrigation  Status:  Discontinued          As needed 11/20/11 0204 11/20/11 0235   11/20/11 0000   ceFAZolin (ANCEF) IVPB 1 g/50 mL premix        1 g 100 mL/hr over 30 Minutes Intravenous  Once 11/19/11 2349 11/20/11 0020   11/20/11 0000   ceFAZolin (ANCEF) IVPB 1 g/50 mL premix  Status:  Discontinued        1 g 100 mL/hr over 30 Minutes Intravenous  Once 11/19/11 2349 11/20/11 0344   11/19/11 2315   ceFAZolin (ANCEF) 2 g in dextrose 5 % 50 mL IVPB  Status:  Discontinued        2 g 140 mL/hr over 30 Minutes Intravenous  Once 11/19/11 2310 11/20/11 0344   11/19/11 2315   gentamicin (GARAMYCIN) 200 mg in dextrose 5 % 50 mL IVPB  Status:  Discontinued        200 mg 110 mL/hr over 30 Minutes Intravenous  Once 11/19/11 2310 11/19/11 2348   11/19/11 2300   ceFAZolin (ANCEF) IVPB 1 g/50 mL premix        1 g 100 mL/hr over 30 Minutes Intravenous  Once 11/19/11  2253 Dec 02, 2011 2347           Patient was given sequential compression devices and early ambulation to prevent DVT.  Patient benefited maximally from hospital stay and there were no complications.  Dr turner cardiologist consulted to manage atrial fib and preop blood thinners peri op.  Recent vital signs: Patient Vitals for the past 24 hrs:  BP Temp Pulse Resp SpO2  11/21/11 1100 127/74 mmHg - - - -  11/21/11 0559 140/64 mmHg 98.6 F (37 C) 89  18  95 %  11/20/11 2235 138/68 mmHg 98.4 F (36.9 C) 90  18  94 %  11/20/11 1400 137/67 mmHg 98.3 F (36.8 C) 89  18  92 %     Recent laboratory studies:  Basename 11/20/11 1208 December 02, 2011 2045 12-02-2011 2030  WBC -- 8.6 --  HGB -- 10.7* --  HCT -- 33.0* --  PLT -- 153 --  NA 135 -- 135  K 4.1 -- 4.1  CL 101 -- 99  CO2 23 -- 26  BUN 27* -- 32*  CREATININE 2.13* -- 2.40*  GLUCOSE 108* -- 114*  INR -- -- 1.36  CALCIUM 8.8 -- --     Discharge Medications:     Medication List  As of 11/21/2011  1:56 PM   TAKE these medications         ALPRAZolam 0.5 MG tablet   Commonly known as: XANAX   Take 0.5 mg by mouth 2 (two) times daily as needed. For anxiety      amiodarone 300 MG tablet   Commonly known as: PACERONE   Take 1 tablet (300 mg total) by mouth daily.      amitriptyline 50 MG tablet   Commonly known as: ELAVIL   Take 50 mg by mouth at bedtime.      amLODipine 10 MG tablet   Commonly known as: NORVASC   Take 10 mg by mouth daily.      carvedilol 12.5 MG tablet   Commonly known as: COREG   Take 12.5 mg by mouth 2 (two) times daily with a meal.      cephALEXin 500 MG capsule   Commonly known as: KEFLEX   Take 1 capsule (500 mg total) by mouth 3 (three) times daily.      docusate sodium 100 MG capsule   Commonly known as: COLACE   Take 100 mg by mouth daily as needed. For constipation      ELIQUIS 2.5 MG Tabs tablet   Generic drug: apixaban   Take 2.5 mg by mouth 2 (two) times daily.      furosemide 40 MG tablet   Commonly known as: LASIX   Take 1 tablet (40 mg total) by mouth daily.      insulin glargine 100 UNIT/ML injection   Commonly known as: LANTUS   Inject 8 Units into the skin at bedtime.      sitaGLIPtin 25 MG tablet   Commonly known as: JANUVIA   Take 25 mg by mouth daily.      vitamin B-12 1000 MCG tablet   Commonly known as: CYANOCOBALAMIN   Take 1,000 mcg by mouth daily.            Diagnostic Studies: Dg Elbow Complete Left  12-02-2011  *RADIOLOGY REPORT*  Clinical Data: Left elbow pain.  Multiple laceration and tears after a fall.  LEFT ELBOW - COMPLETE 3+ VIEW  Comparison: Left forearm radiographs 01/18/2010.  Findings: The left elbow is located.  There is no significant effusion.  A large soft tissue laceration is seen posterior to the left elbow.  Radiodense material may represent a foreign body or treatment artifact.  IMPRESSION:  1.  Large soft tissue laceration posterior to the left elbow. 2.   No acute fracture or joint effusion.   Original Report Authenticated By: Jamesetta Orleans. MATTERN, M.D.    Ct Head Wo Contrast  11/19/2011  *RADIOLOGY REPORT*  Clinical Data: Fall.  Headache.  CT HEAD WITHOUT CONTRAST  Technique:  Contiguous axial images were obtained from the base of the skull through the vertex without contrast.  Comparison: None.  Findings: Encephalomalacia of the right temporal lobe is compatible with a remote right MCA territory infarct.  Ex vacuo dilation is noted in the right lateral ventricle.  There is a remote lacunar infarct of the right external capsule.  Remote lacunar infarcts are present in the thalami bilaterally.  A focus of hypoattenuation in the right corona radiata likely represents remote infarcts as well, but is more agent termini.  Atrophy and subcortical white matter disease bilaterally is compatible with chronic microvascular ischemic change.  No hemorrhage or mass lesion is present.  The ventricles are proportionate to the degree of atrophy.  No significant extra-axial fluid collection is present.  The paranasal sinuses and mastoid air cells are clear.  The osseous skull is intact.  Atherosclerotic calcifications are present in the cavernous carotid arteries bilaterally. No significant extra calvarial soft tissue injury is evident.  IMPRESSION:  1.  Remote posterior right MCA territory infarct. 2.  Remote lacunar infarcts of the basal ganglia bilaterally. 3.  Age indeterminate infarct of the right corona radiata is likely remote as well. 4.  Atrophy and diffuse white matter disease likely reflects the sequelae of chronic microvascular ischemia. 5.  Atherosclerosis.   Original Report Authenticated By: Jamesetta Orleans. MATTERN, M.D.    Mr Lumbar Spine Wo Contrast  11/08/2011  *RADIOLOGY REPORT*  Clinical Data: 78 year old female with low back pain radiating to the tail bone.  Lower extremity weakness.  Difficulty walking. History of colon cancer/resection.  MRI LUMBAR SPINE  WITHOUT CONTRAST  Technique:  Multiplanar and multiecho pulse sequences of the lumbar spine were obtained without intravenous contrast.  Comparison: CT abdomen and pelvis 10/14/2009 and earlier.  [facility] lumbar radiographs 04/18/2009.  Findings: Normal lumbar segmentation depicted on comparison. Stable lumbar vertebral height and alignment and 2011.  The inferior endplate Schmorl's node at L4 with no associated marrow edema.  Occasional benign vertebral body hemangiomas (L5 on the left). No marrow edema or evidence of acute osseous abnormality. The coccyx is not included, but the visualized sacrum appears normal.   Visualized lower thoracic spinal cord is normal with conus medularis at L1.  Stable and negative for age visualized abdominal viscera. Visualized paraspinal soft tissues are within normal limits.  T11-T12:  Minor disc bulge.  No significant stenosis.  T12-L1:  Small or mild left lateral recess broad-based disc protrusion.  No significant stenosis.  L1-L2:  Right anterior disc osteophyte complex, otherwise negative.  L2-L3:  Moderate circumferential disc bulge with mild endplate spurring.  Moderate facet and ligament flavum hypertrophy. Moderate to severe subsequent spinal stenosis.  Mild lateral recess and no significant foraminal stenosis.  L3-L4:  Moderate to severe circumferential disc osteophyte complex. Severe facet and ligament flavum hypertrophy greater on the right. Severe spinal stenosis.  Moderate to severe right and moderate left lateral recess stenosis.  Mild right greater than left L3 foraminal stenosis.  L4-L5:  Moderate circumferential disc bulge.  Severe facet and ligament flavum hypertrophy.  Severe spinal and moderate severe bilateral lateral recess stenosis.  Mild to moderate left L4 foraminal stenosis.  L5-S1:  Moderate circumferential disc osteophyte complex, maximal laterally on the right.  Moderate facet and ligament flavum hypertrophy greater on the left.  No  significant spinal stenosis. Mild to moderate left lateral recess and left L5 foraminal stenosis.  IMPRESSION: 1.  Multifactorial moderate and severe spinal and lateral recess stenosis at L2-L3, L3-L4 (appears maximal), and L4-L5. 2.  No acute lumbar osseous abnormality.  Chronic L4 inferior endplate Schmorl's node.  Original Report Authenticated By: Harley Hallmark, M.D.   Dg Chest Port 1 View  11/20/2011  *RADIOLOGY REPORT*  Clinical Data: Preoperative examination.  PORTABLE CHEST - 1 VIEW  Comparison: Portable chest 08/27/2011.  CT chest 09/16/2006.  Findings: Mild cardiomegaly and pulmonary vascular congestion appear unchanged.  There is no consolidative process, pneumothorax or effusion with subsegmental atelectasis in the left base identified.  IMPRESSION: Cardiomegaly and mild vascular congestion.   Original Report Authenticated By: Bernadene Bell. D'ALESSIO, M.D.    Dg Knee Complete 4 Views Left  11/19/2011  *RADIOLOGY REPORT*  Clinical Data: Left knee pain.  Fall.  LEFT KNEE - COMPLETE 4+ VIEW  Comparison: Left knee radiographs 01/18/2010.  Findings: Left knee is located.  No acute bone or soft tissue abnormalities present.  There is no definite effusion. Mild osteopenia is evident.  Degenerative changes are again noted.  IMPRESSION: No acute abnormality.   Original Report Authenticated By: Jamesetta Orleans. MATTERN, M.D.     Disposition: 01-Home or Self Care  Discharge Orders    Future Orders Please Complete By Expires   Diet Carb Modified      Call MD / Call 911      Comments:   If you experience chest pain or shortness of breath, CALL 911 and be transported to the hospital emergency room.  If you develope a fever above 101 F, pus (white drainage) or increased drainage or redness at the wound, or calf pain, call your surgeon's office.   Increase activity slowly as tolerated         Follow-up Information    Follow up with GRAVES,JOHN L, MD. Schedule an appointment as soon as possible for a  visit on 11/27/2011.   Contact information:   [address] [place] [address] [phone]       Follow up with Quintella Reichert, MD on 12/05/2011. (1:45pm)    Contact information:   [address] [place] [address] [phone]           Signed: Matthew Folks 11/21/2011, 1:56 PM

## 2011-11-21 NOTE — Progress Notes (Signed)
ANTIBIOTIC CONSULT NOTE -follow up Pharmacy Consult for gentamicin, also on ancef until 8/31 am Indication: s/p I&D contaminated wound after fall  No Known Allergies  Patient Measurements: Height: 5\' 3"  (160 cm) Weight: 184 lb (83.462 kg) IBW/kg (Calculated) : 52.4  Adjusted Body Weight: 65kg  Vital Signs: Temp: 98.6 F (37 C) (08/30 0559) BP: 140/64 mmHg (08/30 0559) Pulse Rate: 89  (08/30 0559) Intake/Output from previous day: 08/29 0701 - 08/30 0700 In: 480 [P.O.:480] Out: -  Intake/Output from this shift:    Labs:  Basename 11/20/11 1208 11/19/11 2045 11/19/11 2030  WBC -- 8.6 --  HGB -- 10.7* --  PLT -- 153 --  LABCREA -- -- --  CREATININE 2.13* -- 2.40*   Estimated Creatinine Clearance: 20.5 ml/min (by C-G formula based on Cr of 2.13).  Basename 11/21/11 0645  VANCOTROUGH --  Leodis Binet --  VANCORANDOM --  GENTTROUGH --  GENTPEAK --  GENTRANDOM 2.3  TOBRATROUGH --  TOBRAPEAK --  TOBRARND --  AMIKACINPEAK --  AMIKACINTROU --  AMIKACIN --     Microbiology: No results found for this or any previous visit (from the past 720 hour(s)).  Medical History: Past Medical History  Diagnosis Date  . Colon cancer   . Hypertension   . Renal insufficiency, mild   . Anxiety   . Diabetes mellitus     diabetic retinopathy  . Anemia   . Aortic stenosis, mild      Assessment: 78 yo diabetic lady with contaminated wound after fall on ancef and  Gentamicin s/p I&D of LUE 11/20/11.  She was given gent 200 mg x 1 dose yesterday at 05am.  Her random gent level 26 hrs post dose is 2.3 mcg/ml.   Anticipate she will be DC'd home on PO abx.     Goal of Therapy:  Gentamicin trough level <2 mcg/ml  Plan:  1. Gentamicin 100 mg IV q24 to start tonight at [facility] 2. Ancef 2gm q12 hrs endstoday with 1800 dose after 4 doses as ordered by MD Herby Abraham, Pharm.D. [phone] 11/21/2011 10:22 AM

## 2011-12-05 ENCOUNTER — Inpatient Hospital Stay (HOSPITAL_COMMUNITY)
Admission: EM | Admit: 2011-12-05 | Discharge: 2011-12-09 | DRG: 683 | Disposition: A | Discharge status: Home / Self Care | Payer: Medicare Other | Attending: Internal Medicine | Admitting: Internal Medicine

## 2011-12-05 ENCOUNTER — Encounter (HOSPITAL_COMMUNITY): Payer: Self-pay

## 2011-12-05 ENCOUNTER — Emergency Department (HOSPITAL_COMMUNITY): Payer: Medicare Other

## 2011-12-05 DIAGNOSIS — Z23 Encounter for immunization: Secondary | ICD-10-CM

## 2011-12-05 DIAGNOSIS — E118 Type 2 diabetes mellitus with unspecified complications: Secondary | ICD-10-CM | POA: Diagnosis present

## 2011-12-05 DIAGNOSIS — D649 Anemia, unspecified: Secondary | ICD-10-CM

## 2011-12-05 DIAGNOSIS — N179 Acute kidney failure, unspecified: Principal | ICD-10-CM | POA: Diagnosis present

## 2011-12-05 DIAGNOSIS — B965 Pseudomonas (aeruginosa) (mallei) (pseudomallei) as the cause of diseases classified elsewhere: Secondary | ICD-10-CM | POA: Diagnosis present

## 2011-12-05 DIAGNOSIS — E1139 Type 2 diabetes mellitus with other diabetic ophthalmic complication: Secondary | ICD-10-CM | POA: Diagnosis present

## 2011-12-05 DIAGNOSIS — I10 Essential (primary) hypertension: Secondary | ICD-10-CM

## 2011-12-05 DIAGNOSIS — S51009A Unspecified open wound of unspecified elbow, initial encounter: Secondary | ICD-10-CM

## 2011-12-05 DIAGNOSIS — E11319 Type 2 diabetes mellitus with unspecified diabetic retinopathy without macular edema: Secondary | ICD-10-CM | POA: Diagnosis present

## 2011-12-05 DIAGNOSIS — N184 Chronic kidney disease, stage 4 (severe): Secondary | ICD-10-CM | POA: Diagnosis present

## 2011-12-05 DIAGNOSIS — I4891 Unspecified atrial fibrillation: Secondary | ICD-10-CM | POA: Diagnosis present

## 2011-12-05 DIAGNOSIS — F411 Generalized anxiety disorder: Secondary | ICD-10-CM | POA: Diagnosis present

## 2011-12-05 DIAGNOSIS — E876 Hypokalemia: Secondary | ICD-10-CM | POA: Diagnosis present

## 2011-12-05 DIAGNOSIS — I129 Hypertensive chronic kidney disease with stage 1 through stage 4 chronic kidney disease, or unspecified chronic kidney disease: Secondary | ICD-10-CM | POA: Diagnosis present

## 2011-12-05 DIAGNOSIS — Z79899 Other long term (current) drug therapy: Secondary | ICD-10-CM

## 2011-12-05 DIAGNOSIS — D638 Anemia in other chronic diseases classified elsewhere: Secondary | ICD-10-CM | POA: Diagnosis present

## 2011-12-05 DIAGNOSIS — E1169 Type 2 diabetes mellitus with other specified complication: Secondary | ICD-10-CM | POA: Diagnosis present

## 2011-12-05 DIAGNOSIS — Z794 Long term (current) use of insulin: Secondary | ICD-10-CM

## 2011-12-05 DIAGNOSIS — N390 Urinary tract infection, site not specified: Secondary | ICD-10-CM | POA: Diagnosis present

## 2011-12-05 DIAGNOSIS — E119 Type 2 diabetes mellitus without complications: Secondary | ICD-10-CM

## 2011-12-05 DIAGNOSIS — Z85038 Personal history of other malignant neoplasm of large intestine: Secondary | ICD-10-CM

## 2011-12-05 LAB — COMPREHENSIVE METABOLIC PANEL
Albumin: 3.3 g/dL — ABNORMAL LOW (ref 3.5–5.2)
BUN: 42 mg/dL — ABNORMAL HIGH (ref 6–23)
Calcium: 9.5 mg/dL (ref 8.4–10.5)
Creatinine, Ser: 3.65 mg/dL — ABNORMAL HIGH (ref 0.50–1.10)
GFR calc Af Amer: 12 mL/min — ABNORMAL LOW (ref >90)
Glucose, Bld: 154 mg/dL — ABNORMAL HIGH (ref 70–99)
Total Protein: 6.8 g/dL (ref 6.0–8.3)

## 2011-12-05 LAB — URINALYSIS, ROUTINE W REFLEX MICROSCOPIC
Ketones, ur: NEGATIVE mg/dL
Nitrite: NEGATIVE
Specific Gravity, Urine: 1.007 (ref 1.005–1.030)
pH: 6.5 (ref 5.0–8.0)

## 2011-12-05 LAB — CBC WITH DIFFERENTIAL/PLATELET
Basophils Relative: 1 % (ref 0–1)
Eosinophils Absolute: 0.1 10*3/uL (ref 0.0–0.7)
Eosinophils Relative: 1 % (ref 0–5)
Hemoglobin: 10.2 g/dL — ABNORMAL LOW (ref 12.0–15.0)
Lymphs Abs: 1.5 10*3/uL (ref 0.7–4.0)
MCH: 28.3 pg (ref 26.0–34.0)
MCHC: 31.5 g/dL (ref 30.0–36.0)
MCV: 89.8 fL (ref 78.0–100.0)
Monocytes Relative: 8 % (ref 3–12)
RBC: 3.61 MIL/uL — ABNORMAL LOW (ref 3.87–5.11)

## 2011-12-05 LAB — URINE MICROSCOPIC-ADD ON

## 2011-12-05 MED ORDER — FUROSEMIDE 10 MG/ML IJ SOLN
40.0000 mg | Freq: Once | INTRAMUSCULAR | Status: AC
  Administered 2011-12-05: 40 mg via INTRAVENOUS
  Filled 2011-12-05: qty 4

## 2011-12-05 NOTE — ED Notes (Signed)
Sent from md office for abn kidney labs and leg swelling.

## 2011-12-05 NOTE — ED Notes (Signed)
DR. Mort Sawyers AT BEDSIDE EVALAUTING PT.

## 2011-12-05 NOTE — ED Provider Notes (Signed)
History     CSN: [id number]  Arrival date & time 12/05/11  9147   First MD Initiated Contact with Patient 12/05/11 2110      Chief Complaint  Patient presents with  . Leg Swelling    (Consider location/radiation/quality/duration/timing/severity/associated sxs/prior treatment) HPI Comments: Pt with hx of CHF, mild AS comes in with cc of leg swelling. She was seen by her Cardiologist, and it was noted that her Cr is elevated. She is reporting worsening SOB - gradual and worsening leg swelling. The Cardiologist and PCP discusseded the case and requested patient to come to the ER for further evaluation for admission. Pt has no active chest pains, she has been taking all her meds as prescribed.  The history is provided by the patient.    Past Medical History  Diagnosis Date  . Colon cancer   . Hypertension   . Renal insufficiency, mild   . Anxiety   . Diabetes mellitus     diabetic retinopathy  . Anemia   . Aortic stenosis, mild     Past Surgical History  Procedure Date  . Abdominal surgery   . Abdominal hysterectomy   . Colon surgery   . Appendectomy   . Colostomy   . Cardioversion 09/30/2011    Procedure: CARDIOVERSION;  Surgeon: Quintella Reichert, MD;  Location: [facility] OR;  Service: Cardiovascular;  Laterality: N/A;  . I&d extremity 11/20/2011    Procedure: IRRIGATION AND DEBRIDEMENT EXTREMITY;  Surgeon: Harvie Junior, MD;  Location: [facility] OR;  Service: Orthopedics;  Laterality: Left;    No family history on file.  History  Substance Use Topics  . Smoking status: Never Smoker   . Smokeless tobacco: Current User  . Alcohol Use: No    OB History    Grav Para Term Preterm Abortions TAB SAB Ect Mult Living                  Review of Systems  Constitutional: Positive for activity change.  HENT: Negative for facial swelling and neck pain.   Respiratory: Positive for shortness of breath. Negative for cough and wheezing.   Cardiovascular: Positive for leg swelling. Negative  for chest pain.  Gastrointestinal: Negative for nausea, vomiting, abdominal pain, diarrhea, constipation, blood in stool and abdominal distention.  Genitourinary: Negative for dysuria, hematuria and difficulty urinating.  Skin: Negative for color change.  Neurological: Negative for speech difficulty and headaches.  Hematological: Does not bruise/bleed easily.  Psychiatric/Behavioral: Negative for confusion.    Allergies  Review of patient's allergies indicates no known allergies.  Home Medications   Current Outpatient Rx  Name Route Sig Dispense Refill  . ALPRAZOLAM 0.5 MG PO TABS Oral Take 0.5 mg by mouth 2 (two) times daily as needed. For anxiety    . AMIODARONE HCL 300 MG PO TABS Oral Take 1 tablet (300 mg total) by mouth daily. 30 tablet 0  . AMITRIPTYLINE HCL 50 MG PO TABS Oral Take 50 mg by mouth at bedtime.    Marland Kitchen AMLODIPINE BESYLATE 10 MG PO TABS Oral Take 10 mg by mouth daily.    . APIXABAN 2.5 MG PO TABS Oral Take 2.5 mg by mouth 2 (two) times daily.    Marland Kitchen CARVEDILOL 12.5 MG PO TABS Oral Take 12.5 mg by mouth 2 (two) times daily with a meal.    . DOCUSATE SODIUM 100 MG PO CAPS Oral Take 100 mg by mouth daily as needed. For constipation    . FUROSEMIDE 40 MG PO  TABS Oral Take 1 tablet (40 mg total) by mouth daily.    . INSULIN GLARGINE 100 UNIT/ML [place] SOLN Subcutaneous Inject 8 Units into the skin at bedtime.    Marland Kitchen SITAGLIPTIN PHOSPHATE 25 MG PO TABS Oral Take 25 mg by mouth daily.    Marland Kitchen VITAMIN B-12 1000 MCG PO TABS Oral Take 1,000 mcg by mouth daily.      BP 115/61  Pulse 86  Temp 97.8 F (36.6 C) (Oral)  Resp 18  SpO2 95%  Physical Exam  Nursing note and vitals reviewed. Constitutional: She is oriented to person, place, and time. She appears well-developed and well-nourished.  HENT:  Head: Normocephalic and atraumatic.  Eyes: EOM are normal. Pupils are equal, round, and reactive to light.  Neck: Neck supple.  Cardiovascular: Normal rate and regular rhythm.   Murmur  heard. Pulmonary/Chest: Effort normal. No respiratory distress.  Abdominal: Soft. She exhibits no distension. There is no tenderness. There is no rebound and no guarding.  Musculoskeletal: She exhibits edema.  Neurological: She is alert and oriented to person, place, and time.  Skin: Skin is warm and dry.    ED Course  Procedures (including critical care time)  Labs Reviewed  CBC WITH DIFFERENTIAL - Abnormal; Notable for the following:    RBC 3.61 (*)     Hemoglobin 10.2 (*)     HCT 32.4 (*)     All other components within normal limits  COMPREHENSIVE METABOLIC PANEL - Abnormal; Notable for the following:    Glucose, Bld 154 (*)     BUN 42 (*)     Creatinine, Ser 3.65 (*)     Albumin 3.3 (*)     Alkaline Phosphatase 127 (*)     GFR calc non Af Amer 11 (*)     GFR calc Af Amer 12 (*)     All other components within normal limits  URINALYSIS, ROUTINE W REFLEX MICROSCOPIC - Abnormal; Notable for the following:    APPearance CLOUDY (*)     Hgb urine dipstick LARGE (*)     Protein, ur 30 (*)     Leukocytes, UA LARGE (*)     All other components within normal limits  URINE MICROSCOPIC-ADD ON - Abnormal; Notable for the following:    Bacteria, UA FEW (*)     All other components within normal limits  PRO B NATRIURETIC PEPTIDE - Abnormal; Notable for the following:    Pro B Natriuretic peptide (BNP) 4125.0 (*)     All other components within normal limits  TROPONIN I  URINE CULTURE   Dg Chest Port 1 View  12/05/2011  *RADIOLOGY REPORT*  Clinical Data: Arrhythmia.  History of hypertension and diabetes.  PORTABLE CHEST - 1 VIEW 12/05/2011 2204 hours:  Comparison: Portable chest x-ray 11/20/2011, 08/27/2011.  Findings: Cardiac silhouette moderately enlarged but stable. Pulmonary vascularity normal.  Focal opacity in the right mid lung, not present on the prior examinations.  Lungs otherwise clear. Possible small pleural effusions.  IMPRESSION: Focal pneumonia suspected in the right mid  lung.  Stable cardiomegaly without pulmonary edema.   Original Report Authenticated By: Arnell Sieving, M.D.      1. Renal failure (ARF), acute on chronic       MDM  Pt comes in with cc of leg swelling, has hx of CHF, AS and is noted to have acute on chronic kidney injury. We will admit for further med optimization. Likely related to poor perfusion due to worsening CHF,  afib or AS - especially since there is some SOB that is exertional in nature.         Derwood Kaplan, MD 12/06/11 [phone]

## 2011-12-06 ENCOUNTER — Encounter (HOSPITAL_COMMUNITY): Payer: Self-pay | Admitting: *Deleted

## 2011-12-06 DIAGNOSIS — E118 Type 2 diabetes mellitus with unspecified complications: Secondary | ICD-10-CM | POA: Diagnosis present

## 2011-12-06 DIAGNOSIS — N390 Urinary tract infection, site not specified: Secondary | ICD-10-CM

## 2011-12-06 DIAGNOSIS — D649 Anemia, unspecified: Secondary | ICD-10-CM | POA: Diagnosis present

## 2011-12-06 DIAGNOSIS — N179 Acute kidney failure, unspecified: Secondary | ICD-10-CM

## 2011-12-06 DIAGNOSIS — E876 Hypokalemia: Secondary | ICD-10-CM

## 2011-12-06 DIAGNOSIS — I10 Essential (primary) hypertension: Secondary | ICD-10-CM | POA: Diagnosis present

## 2011-12-06 HISTORY — DX: Type 2 diabetes mellitus with unspecified complications: E11.8

## 2011-12-06 LAB — BASIC METABOLIC PANEL
BUN: 35 mg/dL — ABNORMAL HIGH (ref 6–23)
CO2: 20 mEq/L (ref 19–32)
Calcium: 7.3 mg/dL — ABNORMAL LOW (ref 8.4–10.5)
GFR calc non Af Amer: 13 mL/min — ABNORMAL LOW (ref >90)
Glucose, Bld: 80 mg/dL (ref 70–99)

## 2011-12-06 LAB — CBC
Hemoglobin: 8.2 g/dL — ABNORMAL LOW (ref 12.0–15.0)
MCH: 28.7 pg (ref 26.0–34.0)
MCHC: 32.2 g/dL (ref 30.0–36.0)
MCV: 89.2 fL (ref 78.0–100.0)
Platelets: 134 10*3/uL — ABNORMAL LOW (ref 150–400)
RBC: 2.86 MIL/uL — ABNORMAL LOW (ref 3.87–5.11)

## 2011-12-06 LAB — GLUCOSE, CAPILLARY: Glucose-Capillary: 94 mg/dL (ref 70–99)

## 2011-12-06 MED ORDER — LEVOFLOXACIN 250 MG PO TABS
250.0000 mg | ORAL_TABLET | Freq: Every day | ORAL | Status: DC
  Administered 2011-12-07: 250 mg via ORAL
  Filled 2011-12-06 (×2): qty 1

## 2011-12-06 MED ORDER — POTASSIUM CHLORIDE CRYS ER 20 MEQ PO TBCR
40.0000 meq | EXTENDED_RELEASE_TABLET | ORAL | Status: AC
  Administered 2011-12-06 (×2): 40 meq via ORAL
  Filled 2011-12-06 (×2): qty 2

## 2011-12-06 MED ORDER — ZOLPIDEM TARTRATE 5 MG PO TABS: 5.0000 mg | ORAL_TABLET | Freq: Every evening | ORAL | Status: DC | PRN

## 2011-12-06 MED ORDER — ACETAMINOPHEN 325 MG PO TABS
650.0000 mg | ORAL_TABLET | Freq: Four times a day (QID) | ORAL | Status: DC | PRN
  Administered 2011-12-07 – 2011-12-09 (×3): 650 mg via ORAL
  Filled 2011-12-06 (×3): qty 2

## 2011-12-06 MED ORDER — HYDROMORPHONE HCL PF 1 MG/ML IJ SOLN: 0.5000 mg | INTRAMUSCULAR | Status: DC | PRN

## 2011-12-06 MED ORDER — ALUM & MAG HYDROXIDE-SIMETH 200-200-20 MG/5ML PO SUSP
30.0000 mL | Freq: Four times a day (QID) | ORAL | Status: DC | PRN
  Filled 2011-12-06 (×2): qty 30

## 2011-12-06 MED ORDER — ENOXAPARIN SODIUM 40 MG/0.4ML ~~LOC~~ SOLN
40.0000 mg | SUBCUTANEOUS | Status: DC
  Administered 2011-12-06: 40 mg via SUBCUTANEOUS
  Filled 2011-12-06 (×2): qty 0.4

## 2011-12-06 MED ORDER — ACETAMINOPHEN 650 MG RE SUPP: 650.0000 mg | Freq: Four times a day (QID) | RECTAL | Status: DC | PRN

## 2011-12-06 MED ORDER — SODIUM CHLORIDE 0.9 % IV SOLN
INTRAVENOUS | Status: DC
  Administered 2011-12-06 – 2011-12-08 (×4): via INTRAVENOUS

## 2011-12-06 MED ORDER — OXYCODONE HCL 5 MG PO TABS
5.0000 mg | ORAL_TABLET | ORAL | Status: DC | PRN
  Administered 2011-12-06: 5 mg via ORAL
  Filled 2011-12-06 (×2): qty 1

## 2011-12-06 MED ORDER — ONDANSETRON HCL 4 MG/2ML IJ SOLN
4.0000 mg | Freq: Four times a day (QID) | INTRAMUSCULAR | Status: DC | PRN
  Filled 2011-12-06: qty 2

## 2011-12-06 MED ORDER — LEVOFLOXACIN 500 MG PO TABS
500.0000 mg | ORAL_TABLET | Freq: Once | ORAL | Status: AC
  Administered 2011-12-06: 500 mg via ORAL
  Filled 2011-12-06: qty 1

## 2011-12-06 MED ORDER — ONDANSETRON HCL 4 MG/2ML IJ SOLN: 4.0000 mg | Freq: Three times a day (TID) | INTRAMUSCULAR | Status: AC | PRN

## 2011-12-06 MED ORDER — ONDANSETRON HCL 4 MG PO TABS
4.0000 mg | ORAL_TABLET | Freq: Four times a day (QID) | ORAL | Status: DC | PRN
  Administered 2011-12-07 – 2011-12-09 (×2): 4 mg via ORAL
  Filled 2011-12-06 (×2): qty 1

## 2011-12-06 NOTE — Progress Notes (Signed)
MEDICATION RELATED NOTE    Pharmacy Re:  Levofloxacin Indication: Renal dose adjustment  No Known Allergies  Patient Measurements: Weight: 191 lb 6.4 oz (86.818 kg)  Labs:  Basename 12/06/11 0600 12/05/11 1842  WBC 6.7 9.3  CREATININE 3.01* 3.65*   The estimated creatinine clearance for this patient is ~ 20-25 ml/min with her current creatinine of 3.0.   Assessment: Levofloxacin 500mg  daily - ordered earlier.  I have amended her dose to give 500mg  x 1 and then 250mg  daily due to renal function.  Plan:  1.  Adjust antibiotics for renal function. 2.  Please consider duration of therapy with planned antibiotics.  Nadara Mustard, PharmD., MS Clinical Pharmacist Pager:  [phone]  Thank you for allowing pharmacy to be part of this patients care team.

## 2011-12-06 NOTE — H&P (Signed)
[facility] History and Physical  [name] AVW:[id number] DOB: 1926-02-05 DOA: 12/05/2011  Referring physician:  PCP: Lenora Boys, MD   Chief Complaint:   HPI: [name] is a 78 y.o. female who was sen to the Ed after being seen by her cardiologist Dr. Armanda Magic who found that she had abnormal labs, worsening renal function, and Edema.  She reports having an increase in her leg swelling, and she denies having any chest pain or SOB.  Her creatinine was  3.   And previously it had been 2.  .  She see Dr. Ruben Reason of [facility].    Review of Systems: The patient denies anorexia, fever, weight loss,, vision loss, decreased hearing, hoarseness, chest pain, syncope, dyspnea on exertion, peripheral edema, balance deficits, hemoptysis, abdominal pain, melena, hematochezia, severe indigestion/heartburn, hematuria, incontinence, genital sores, muscle weakness, suspicious skin lesions, transient blindness, difficulty walking, depression, unusual weight change, abnormal bleeding, enlarged lymph nodes, angioedema, and breast masses.    Past Medical History  Diagnosis Date  . Colon cancer   . Hypertension   . Renal insufficiency, mild   . Anxiety   . Diabetes mellitus     diabetic retinopathy  . Anemia   . Aortic stenosis, mild    Past Surgical History  Procedure Date  . Abdominal surgery   . Abdominal hysterectomy   . Colon surgery   . Appendectomy   . Colostomy   . Cardioversion 09/30/2011    Procedure: CARDIOVERSION;  Surgeon: Quintella Reichert, MD;  Location: [facility] OR;  Service: Cardiovascular;  Laterality: N/A;  . I&d extremity 11/20/2011    Procedure: IRRIGATION AND DEBRIDEMENT EXTREMITY;  Surgeon: Harvie Junior, MD;  Location: [facility] OR;  Service: Orthopedics;  Laterality: Left;   Social History:  reports that she has never smoked. She uses smokeless tobacco. She reports that she does not drink alcohol or use illicit drugs.  where does patient live--home,  ALF, SNF? and with whom if at home?  Can patient participate in ADLs?  No Known Allergies  No family history on file.  (be sure to complete)  Prior to Admission medications   Medication Sig Start Date End Date Taking? Authorizing Provider  ALPRAZolam Prudy Feeler) 0.5 MG tablet Take 0.5 mg by mouth 2 (two) times daily as needed. For anxiety   Yes Historical Provider, MD  amiodarone (PACERONE) 300 MG tablet Take 1 tablet (300 mg total) by mouth daily. 11/21/11 11/20/12 Yes Matthew Folks, PA  amitriptyline (ELAVIL) 50 MG tablet Take 50 mg by mouth at bedtime.   Yes Historical Provider, MD  amLODipine (NORVASC) 10 MG tablet Take 10 mg by mouth daily.   Yes Historical Provider, MD  apixaban (ELIQUIS) 2.5 MG TABS tablet Take 2.5 mg by mouth 2 (two) times daily.   Yes Historical Provider, MD  carvedilol (COREG) 12.5 MG tablet Take 12.5 mg by mouth 2 (two) times daily with a meal.   Yes Historical Provider, MD  docusate sodium (COLACE) 100 MG capsule Take 100 mg by mouth daily as needed. For constipation   Yes Historical Provider, MD  furosemide (LASIX) 40 MG tablet Take 1 tablet (40 mg total) by mouth daily. 09/30/11  Yes Quintella Reichert, MD  insulin glargine (LANTUS) 100 UNIT/ML injection Inject 8 Units into the skin at bedtime.   Yes Historical Provider, MD  sitaGLIPtin (JANUVIA) 25 MG tablet Take 25 mg by mouth daily.   Yes Historical Provider, MD  vitamin B-12 (CYANOCOBALAMIN)  1000 MCG tablet Take 1,000 mcg by mouth daily.   Yes Historical Provider, MD   Physical Exam: Filed Vitals:   12/05/11 1840 12/05/11 2208 12/06/11 0042  BP: 133/70 115/61 128/80  Pulse: 86  85  Temp: 98.7 F (37.1 C) 97.8 F (36.6 C) 97.7 F (36.5 C)  TempSrc: Oral Oral Oral  Resp: 18 18 17   Weight:   86.818 kg (191 lb 6.4 oz)  SpO2: 97% 95% 100%    Physical Exam:  GEN: Pleasant  78 year old elderly Caucasian Female examined  and in no acute distress; cooperative with exam Filed Vitals:   12/05/11 1840 12/05/11  2208 12/06/11 0042  BP: 133/70 115/61 128/80  Pulse: 86  85  Temp: 98.7 F (37.1 C) 97.8 F (36.6 C) 97.7 F (36.5 C)  TempSrc: Oral Oral Oral  Resp: 18 18 17   Weight:   86.818 kg (191 lb 6.4 oz)  SpO2: 97% 95% 100%   Blood pressure 128/80, pulse 85, temperature 97.7 F (36.5 C), temperature source Oral, resp. rate 17, weight 86.818 kg (191 lb 6.4 oz), SpO2 100.00%. PSYCH: SHe is alert and oriented x4; does not appear anxious does not appear depressed; affect is normal HEENT: Normocephalic and Atraumatic, Mucous membranes pink; PERRLA; EOM intact; Fundi:  Benign;  No scleral icterus, Nares: Patent, Oropharynx: Clear, Edentulous, Neck:  FROM, no cervical lymphadenopathy nor thyromegaly or carotid bruit; no JVD; Breasts:: Not examined CHEST WALL: No tenderness CHEST: Normal respiration, clear to auscultation bilaterally HEART: Regular rate and rhythm; no murmurs rubs or gallops BACK: No kyphosis or scoliosis; no CVA tenderness ABDOMEN: Positive Bowel Sounds, Scaphoid, Obese, soft non-tender; no masses, no organomegaly, no pannus; no intertriginous candida. Rectal Exam: Not done EXTREMITIES:; no cyanosis, clubbing or 2 + EDEMA of BLE; no ulcerations. Genitalia: not examined PULSES: 2+ and symmetric SKIN: Normal hydration no rash or ulceration CNS: Cranial nerves 2-12 grossly intact no focal neurologic deficit    Labs on Admission:  Basic Metabolic Panel:  Lab 12/05/11 1610  NA 139  K 4.0  CL 100  CO2 27  GLUCOSE 154*  BUN 42*  CREATININE 3.65*  CALCIUM 9.5  MG --  PHOS --   Liver Function Tests:  Lab 12/05/11 1842  AST 13  ALT <5  ALKPHOS 127*  BILITOT 0.3  PROT 6.8  ALBUMIN 3.3*   No results found for this basename: LIPASE:5,AMYLASE:5 in the last 168 hours No results found for this basename: AMMONIA:5 in the last 168 hours CBC:  Lab 12/05/11 1842  WBC 9.3  NEUTROABS 6.9  HGB 10.2*  HCT 32.4*  MCV 89.8  PLT 178   Cardiac Enzymes:  Lab 12/05/11 2205    CKTOTAL --  CKMB --  CKMBINDEX --  TROPONINI <0.30    BNP (last 3 results)  Basename 12/05/11 2205 11/20/11 1544 08/27/11 1931  PROBNP 4125.0* 4130.0* 4980.0*   CBG: No results found for this basename: GLUCAP:5 in the last 168 hours  Radiological Exams on Admission: Dg Chest Port 1 View  12/05/2011  *RADIOLOGY REPORT*  Clinical Data: Arrhythmia.  History of hypertension and diabetes.  PORTABLE CHEST - 1 VIEW 12/05/2011 2204 hours:  Comparison: Portable chest x-ray 11/20/2011, 08/27/2011.  Findings: Cardiac silhouette moderately enlarged but stable. Pulmonary vascularity normal.  Focal opacity in the right mid lung, not present on the prior examinations.  Lungs otherwise clear. Possible small pleural effusions.  IMPRESSION: Focal pneumonia suspected in the right mid lung.  Stable cardiomegaly without pulmonary edema.  Original Report Authenticated By: Arnell Sieving, M.D.       Assessment: Active Problems:  Renal failure (ARF), acute on chronic  Edema  Anemia  Diabetes mellitus type II  Hypertension   Plan:    Admit to Med Surg Bed Gentle hydration, Hold Diuretics Check Albumin and PreAlbumin levels, Notify [employer] (On Call for Dr. Kathrene Bongo) DVT prophylaxis   Code Status:  Family Communication:  Disposition Plan:   Time spent: 60 minutes  Ron Parker [facility] Pager [phone]  If 7PM-7AM, please contact night-coverage [email or web address] Password [place] 12/06/2011, 12:52 AM

## 2011-12-06 NOTE — Progress Notes (Signed)
Patient admitted earlier today after being sent from Dr. Norris Cross office secondary to increased creatinine.  I have seen and examined her today. She does have a UTI for which I will start her on levaquin pending culture data. She is hypokalemic today (probably 2/2 a diuretic dose given in the ED), will replete and check her Magnesium level. Her Cr has decreased from 3.65 to 3.01 with IVF. Her baseline Cr is around 2.6. If renal function continues to improve, may be able to DC home in am.  Peggye Pitt, MD [facility] Pager: [phone]

## 2011-12-07 DIAGNOSIS — N179 Acute kidney failure, unspecified: Principal | ICD-10-CM

## 2011-12-07 DIAGNOSIS — N184 Chronic kidney disease, stage 4 (severe): Secondary | ICD-10-CM

## 2011-12-07 DIAGNOSIS — N390 Urinary tract infection, site not specified: Secondary | ICD-10-CM

## 2011-12-07 DIAGNOSIS — E119 Type 2 diabetes mellitus without complications: Secondary | ICD-10-CM

## 2011-12-07 DIAGNOSIS — D649 Anemia, unspecified: Secondary | ICD-10-CM

## 2011-12-07 LAB — CBC
MCH: 28.9 pg (ref 26.0–34.0)
MCV: 90.8 fL (ref 78.0–100.0)
Platelets: 167 10*3/uL (ref 150–400)
RDW: 13.7 % (ref 11.5–15.5)

## 2011-12-07 LAB — BASIC METABOLIC PANEL
BUN: 39 mg/dL — ABNORMAL HIGH (ref 6–23)
CO2: 24 mEq/L (ref 19–32)
Calcium: 8.9 mg/dL (ref 8.4–10.5)
Creatinine, Ser: 3.33 mg/dL — ABNORMAL HIGH (ref 0.50–1.10)
Glucose, Bld: 98 mg/dL (ref 70–99)

## 2011-12-07 LAB — GLUCOSE, CAPILLARY: Glucose-Capillary: 173 mg/dL — ABNORMAL HIGH (ref 70–99)

## 2011-12-07 MED ORDER — VITAMIN B-12 1000 MCG PO TABS
1000.0000 ug | ORAL_TABLET | Freq: Every day | ORAL | Status: DC
  Administered 2011-12-07 – 2011-12-09 (×3): 1000 ug via ORAL
  Filled 2011-12-07 (×3): qty 1

## 2011-12-07 MED ORDER — SULFAMETHOXAZOLE-TMP DS 800-160 MG PO TABS
1.0000 | ORAL_TABLET | Freq: Two times a day (BID) | ORAL | Status: DC
  Filled 2011-12-07 (×2): qty 1

## 2011-12-07 MED ORDER — CARVEDILOL 12.5 MG PO TABS
12.5000 mg | ORAL_TABLET | Freq: Two times a day (BID) | ORAL | Status: DC
  Administered 2011-12-07 – 2011-12-09 (×4): 12.5 mg via ORAL
  Filled 2011-12-07 (×6): qty 1

## 2011-12-07 MED ORDER — APIXABAN 2.5 MG PO TABS
2.5000 mg | ORAL_TABLET | Freq: Two times a day (BID) | ORAL | Status: DC
  Administered 2011-12-07 – 2011-12-09 (×4): 2.5 mg via ORAL
  Filled 2011-12-07 (×6): qty 1

## 2011-12-07 MED ORDER — AMITRIPTYLINE HCL 50 MG PO TABS
50.0000 mg | ORAL_TABLET | Freq: Every day | ORAL | Status: DC
  Administered 2011-12-07 – 2011-12-08 (×2): 50 mg via ORAL
  Filled 2011-12-07 (×3): qty 1

## 2011-12-07 MED ORDER — DOCUSATE SODIUM 100 MG PO CAPS: 100.0000 mg | ORAL_CAPSULE | Freq: Every day | ORAL | Status: DC | PRN

## 2011-12-07 MED ORDER — AMLODIPINE BESYLATE 10 MG PO TABS
10.0000 mg | ORAL_TABLET | Freq: Every day | ORAL | Status: DC
  Administered 2011-12-07 – 2011-12-09 (×3): 10 mg via ORAL
  Filled 2011-12-07 (×3): qty 1

## 2011-12-07 MED ORDER — INSULIN GLARGINE 100 UNIT/ML ~~LOC~~ SOLN
8.0000 [IU] | Freq: Every day | SUBCUTANEOUS | Status: DC
  Administered 2011-12-07 – 2011-12-08 (×2): 8 [IU] via SUBCUTANEOUS

## 2011-12-07 MED ORDER — SULFAMETHOXAZOLE-TMP DS 800-160 MG PO TABS
1.0000 | ORAL_TABLET | Freq: Every day | ORAL | Status: DC
  Administered 2011-12-07 – 2011-12-08 (×2): 1 via ORAL
  Filled 2011-12-07 (×2): qty 1

## 2011-12-07 MED ORDER — AMIODARONE HCL 200 MG PO TABS
300.0000 mg | ORAL_TABLET | Freq: Every day | ORAL | Status: DC
  Administered 2011-12-07 – 2011-12-09 (×3): 300 mg via ORAL
  Filled 2011-12-07 (×5): qty 1

## 2011-12-07 MED ORDER — ALPRAZOLAM 0.5 MG PO TABS
0.5000 mg | ORAL_TABLET | Freq: Two times a day (BID) | ORAL | Status: DC | PRN
  Administered 2011-12-07 – 2011-12-08 (×3): 0.5 mg via ORAL
  Filled 2011-12-07 (×3): qty 1

## 2011-12-07 MED ORDER — SODIUM CHLORIDE 0.9 % IV BOLUS (SEPSIS)
500.0000 mL | Freq: Once | INTRAVENOUS | Status: AC
  Administered 2011-12-07: 10:00:00 via INTRAVENOUS

## 2011-12-07 NOTE — Progress Notes (Signed)
[facility]             Progress Note   Subjective: Complains of significant nausea ever since starting antibiotic, also has been having suprapubic pain when urinating. All 4 daughters present. I listened to their concerns and updated them on our plan of care.  Objective: Vital signs in last 24 hours: Temp:  [97.3 F (36.3 C)-97.9 F (36.6 C)] 97.3 F (36.3 C) (09/15 1000) Pulse Rate:  [80-94] 82  (09/15 1000) Resp:  [17-18] 18  (09/15 1000) BP: (112-142)/(58-69) 142/69 mmHg (09/15 1000) SpO2:  [96 %-100 %] 100 % (09/15 1000) Weight:  [86.32 kg (190 lb 4.8 oz)] 86.32 kg (190 lb 4.8 oz) (09/14 2234) Weight change: -0.499 kg (-1 lb 1.6 oz) Last BM Date: 12/06/11  Intake/Output from previous day: 09/14 0701 - 09/15 0700 In: 1080 [P.O.:480; I.V.:600] Out: 1875 [Urine:1875] Total I/O In: 240 [P.O.:240] Out: -    Physical Exam: General: Alert, awake, oriented x3, in no acute distress. HEENT: No bruits, no goiter. Heart: Regular rate and rhythm, without murmurs, rubs, gallops. Lungs: Clear to auscultation bilaterally. Abdomen: Soft, nondistended, positive bowel sounds. Extremities: mild bilateral LE edema, with positive pedal pulses. Neuro: Grossly intact, nonfocal.    Lab Results: Basic Metabolic Panel:  Basename 12/07/11 0505 12/06/11 0600  NA 139 142  K 5.1 2.8*  CL 106 110  CO2 24 20  GLUCOSE 98 80  BUN 39* 35*  CREATININE 3.33* 3.01*  CALCIUM 8.9 7.3*  MG -- 1.7  PHOS -- --   Liver Function Tests:  [facility] 12/05/11 1842  AST 13  ALT <5  ALKPHOS 127*  BILITOT 0.3  PROT 6.8  ALBUMIN 3.3*   CBC:  Basename 12/07/11 0505 12/06/11 0600 12/05/11 1842  WBC 10.7* 6.7 --  NEUTROABS -- -- 6.9  HGB 9.7* 8.2* --  HCT 30.5* 25.5* --  MCV 90.8 89.2 --  PLT 167 134* --   Cardiac Enzymes:  Basename 12/05/11 2205  CKTOTAL --  CKMB --  CKMBINDEX --  TROPONINI <0.30   BNP:  Basename 12/05/11 2205  PROBNP 4125.0*   CBG:  Basename  12/07/11 1216 12/07/11 0810 12/06/11 2223 12/06/11 1644 12/06/11 1132 12/06/11 0806  GLUCAP 130* 88 139* 148* 93 102*   Urinalysis:  Basename 12/05/11 1908  COLORURINE YELLOW  LABSPEC 1.007  PHURINE 6.5  GLUCOSEU NEGATIVE  HGBUR LARGE*  BILIRUBINUR NEGATIVE  KETONESUR NEGATIVE  PROTEINUR 30*  UROBILINOGEN 0.2  NITRITE NEGATIVE  LEUKOCYTESUR LARGE*    Recent Results (from the past 240 hour(s))  URINE CULTURE     Status: Normal (Preliminary result)   Collection Time   12/05/11  7:08 PM      Component Value Range Status Comment   Specimen Description URINE, CLEAN CATCH   Final    Special Requests CX ADDED AT 2200   Final    Culture  Setup Time 12/05/2011 22:20   Final    Colony Count >=100,000 COLONIES/ML   Final    Culture PSEUDOMONAS AERUGINOSA   Final    Report Status PENDING   Incomplete     Studies/Results: Dg Chest Port 1 View  12/05/2011  *RADIOLOGY REPORT*  Clinical Data: Arrhythmia.  History of hypertension and diabetes.  PORTABLE CHEST - 1 VIEW 12/05/2011 2204 hours:  Comparison: Portable chest x-ray 11/20/2011, 08/27/2011.  Findings: Cardiac silhouette moderately enlarged but stable. Pulmonary vascularity normal.  Focal opacity in the right mid lung, not present on the prior examinations.  Lungs otherwise clear.  Possible small pleural effusions.  IMPRESSION: Focal pneumonia suspected in the right mid lung.  Stable cardiomegaly without pulmonary edema.   Original Report Authenticated By: Arnell Sieving, M.D.     Medications: Scheduled Meds:    . amiodarone  300 mg Oral Daily  . amitriptyline  50 mg Oral QHS  . amLODipine  10 mg Oral Daily  . apixaban  2.5 mg Oral BID  . carvedilol  12.5 mg Oral BID WC  . insulin glargine  8 Units Subcutaneous QHS  . levofloxacin  500 mg Oral Once  . potassium chloride  40 mEq Oral Q4H  . sodium chloride  500 mL Intravenous Once  . sulfamethoxazole-trimethoprim  1 tablet Oral Daily  . vitamin B-12  1,000 mcg Oral Daily  .  DISCONTD: enoxaparin (LOVENOX) injection  40 mg Subcutaneous Q24H  . DISCONTD: levofloxacin  250 mg Oral Daily  . DISCONTD: sulfamethoxazole-trimethoprim  1 tablet Oral Q12H   Continuous Infusions:    . sodium chloride 50 mL/hr at 12/06/11 1945   PRN Meds:.acetaminophen, acetaminophen, ALPRAZolam, alum & mag hydroxide-simeth, docusate sodium, HYDROmorphone (DILAUDID) injection, ondansetron (ZOFRAN) IV, ondansetron (ZOFRAN) IV, ondansetron, oxyCODONE, zolpidem  Assessment/Plan:  Principal Problem:  *Renal failure (ARF), acute on chronic Active Problems:  UTI (lower urinary tract infection)  Anemia  Diabetes mellitus type II  Hypertension  Hypokalemia  A-fib  CKD (chronic kidney disease) stage 4, GFR 15-29 ml/min   Acute on CKD Stage 4 -Creatinine from 3.6 on admission to 3.0 yesterday to 3.3 today. Her baseline is around 2.1-2.6. -Not on any meds that affect renal function. -She does appear dry clinically.  -Was given a 500 cc bolus earlier today. -Will increase IVF to 75 ml/hr. -Will check renal US to r/o obstruction/hydronephrosis.  UTI -Significant nausea with levaquin. -Will switch over to bactrim pending culture data.  Hypokalemia -Now K is high. -Follow. -Mag ok at 1.7.  Anemia of Chronic Disease -Hb is around her baseline of 8-10.   Time spent coordinating care and discussing with family: 35 minutes    LOS: 2 days   [facility] Pager: [phone] 12/07/2011, 1:58 PM

## 2011-12-08 ENCOUNTER — Inpatient Hospital Stay (HOSPITAL_COMMUNITY): Payer: Medicare Other

## 2011-12-08 DIAGNOSIS — I10 Essential (primary) hypertension: Secondary | ICD-10-CM

## 2011-12-08 LAB — BASIC METABOLIC PANEL
BUN: 33 mg/dL — ABNORMAL HIGH (ref 6–23)
CO2: 22 mEq/L (ref 19–32)
Chloride: 106 mEq/L (ref 96–112)
Creatinine, Ser: 3.08 mg/dL — ABNORMAL HIGH (ref 0.50–1.10)

## 2011-12-08 LAB — URINE CULTURE

## 2011-12-08 LAB — GLUCOSE, CAPILLARY
Glucose-Capillary: 53 mg/dL — ABNORMAL LOW (ref 70–99)
Glucose-Capillary: 71 mg/dL (ref 70–99)

## 2011-12-08 MED ORDER — CIPROFLOXACIN HCL 250 MG PO TABS
250.0000 mg | ORAL_TABLET | Freq: Every day | ORAL | Status: DC
  Administered 2011-12-08 – 2011-12-09 (×2): 250 mg via ORAL
  Filled 2011-12-08 (×2): qty 1

## 2011-12-08 NOTE — Progress Notes (Signed)
Subjective: l arm doing well.    Objective: Vital signs in last 24 hours: Temp:  [97.3 F (36.3 C)-98.3 F (36.8 C)] 97.8 F (36.6 C) (09/16 0538) Pulse Rate:  [75-104] 75  (09/16 0538) Resp:  [18] 18  (09/16 0538) BP: (117-145)/(58-69) 122/69 mmHg (09/16 0538) SpO2:  [94 %-100 %] 94 % (09/16 0538) Weight:  [88.225 kg (194 lb 8 oz)] 88.225 kg (194 lb 8 oz) (09/15 2218)  Intake/Output from previous day: 09/15 0701 - 09/16 0700 In: 2952.5 [P.O.:840; I.V.:2112.5] Out: 700 [Urine:700] Intake/Output this shift:     Basename 12/07/11 0505 12/06/11 0600 12/05/11 1842  HGB 9.7* 8.2* 10.2*    Basename 12/07/11 0505 12/06/11 0600  WBC 10.7* 6.7  RBC 3.36* 2.86*  HCT 30.5* 25.5*  PLT 167 134*    Basename 12/08/11 0435 12/07/11 0505  NA 138 139  K 5.1 5.1  CL 106 106  CO2 22 24  BUN 33* 39*  CREATININE 3.08* 3.33*  GLUCOSE 95 98  CALCIUM 8.6 8.9   No results found for this basename: LABPT:2,INR:2 in the last 72 hours  l arm wound improvingg  Assessment/Plan: Wound l elbow// f/u Anelly Samarin thursday   Makaiah Terwilliger L 12/08/2011, 8:07 AM

## 2011-12-08 NOTE — Progress Notes (Signed)
Hypoglycemic Event  CBG: 53  Treatment: 15 GM carbohydrate snack  Symptoms: None  Follow-up CBG: Time: 8:17 CBG Result: 71  Possible Reasons for Event: Medication regimen: PM Lantus  Comments/MD notified: MD Philip Aspen notified    [name], Natasha Hammitt L  Remember to initiate Hypoglycemia Order Set & complete

## 2011-12-08 NOTE — Progress Notes (Signed)
[facility]             Progress Note   Subjective: Nausea resolved. Feels much better today. No family present at bedside.  Objective: Vital signs in last 24 hours: Temp:  [97.4 F (36.3 C)-98.3 F (36.8 C)] 97.5 F (36.4 C) (09/16 0907) Pulse Rate:  [75-104] 80  (09/16 0907) Resp:  [18-19] 19  (09/16 0907) BP: (117-145)/(51-69) 123/51 mmHg (09/16 0907) SpO2:  [94 %-97 %] 95 % (09/16 0907) Weight:  [88.225 kg (194 lb 8 oz)] 88.225 kg (194 lb 8 oz) (09/15 2218) Weight change: 1.905 kg (4 lb 3.2 oz) Last BM Date:  (ostomy)  Intake/Output from previous day: 09/15 0701 - 09/16 0700 In: 2952.5 [P.O.:840; I.V.:2112.5] Out: 700 [Urine:700] Total I/O In: 120 [P.O.:120] Out: 225 [Urine:225]   Physical Exam: General: Alert, awake, oriented x3, in no acute distress. HEENT: No bruits, no goiter. Heart: Regular rate and rhythm, without murmurs, rubs, gallops. Lungs: Clear to auscultation bilaterally. Abdomen: Soft, nondistended, positive bowel sounds. Extremities: mild bilateral LE edema, with positive pedal pulses. Neuro: Grossly intact, nonfocal.    Lab Results: Basic Metabolic Panel:  Basename 12/08/11 0435 12/07/11 0505 12/06/11 0600  NA 138 139 --  K 5.1 5.1 --  CL 106 106 --  CO2 22 24 --  GLUCOSE 95 98 --  BUN 33* 39* --  CREATININE 3.08* 3.33* --  CALCIUM 8.6 8.9 --  MG -- -- 1.7  PHOS -- -- --   Liver Function Tests:  [facility] 12/05/11 1842  AST 13  ALT <5  ALKPHOS 127*  BILITOT 0.3  PROT 6.8  ALBUMIN 3.3*   CBC:  Basename 12/07/11 0505 12/06/11 0600 12/05/11 1842  WBC 10.7* 6.7 --  NEUTROABS -- -- 6.9  HGB 9.7* 8.2* --  HCT 30.5* 25.5* --  MCV 90.8 89.2 --  PLT 167 134* --   Cardiac Enzymes:  Basename 12/05/11 2205  CKTOTAL --  CKMB --  CKMBINDEX --  TROPONINI <0.30   BNP:  Basename 12/05/11 2205  PROBNP 4125.0*   CBG:  Basename 12/08/11 0817 12/08/11 0741 12/07/11 2151 12/07/11 1715 12/07/11 1216 12/07/11 0810    GLUCAP 71 53* 164* 173* 130* 88   Urinalysis:  Basename 12/05/11 1908  COLORURINE YELLOW  LABSPEC 1.007  PHURINE 6.5  GLUCOSEU NEGATIVE  HGBUR LARGE*  BILIRUBINUR NEGATIVE  KETONESUR NEGATIVE  PROTEINUR 30*  UROBILINOGEN 0.2  NITRITE NEGATIVE  LEUKOCYTESUR LARGE*    Recent Results (from the past 240 hour(s))  URINE CULTURE     Status: Normal (Preliminary result)   Collection Time   12/05/11  7:08 PM      Component Value Range Status Comment   Specimen Description URINE, CLEAN CATCH   Final    Special Requests CX ADDED AT 2200   Final    Culture  Setup Time 12/05/2011 22:20   Final    Colony Count >=100,000 COLONIES/ML   Final    Culture PSEUDOMONAS AERUGINOSA   Final    Report Status PENDING   Incomplete     Studies/Results: No results found.  Medications: Scheduled Meds:    . amiodarone  300 mg Oral Daily  . amitriptyline  50 mg Oral QHS  . amLODipine  10 mg Oral Daily  . apixaban  2.5 mg Oral BID  . carvedilol  12.5 mg Oral BID WC  . ciprofloxacin  250 mg Oral Daily  . insulin glargine  8 Units Subcutaneous QHS  . vitamin B-12  1,000  mcg Oral Daily  . DISCONTD: enoxaparin (LOVENOX) injection  40 mg Subcutaneous Q24H  . DISCONTD: levofloxacin  250 mg Oral Daily  . DISCONTD: sulfamethoxazole-trimethoprim  1 tablet Oral Q12H  . DISCONTD: sulfamethoxazole-trimethoprim  1 tablet Oral Daily   Continuous Infusions:    . sodium chloride 75 mL/hr at 12/08/11 0822   PRN Meds:.acetaminophen, acetaminophen, ALPRAZolam, alum & mag hydroxide-simeth, docusate sodium, HYDROmorphone (DILAUDID) injection, ondansetron (ZOFRAN) IV, ondansetron, oxyCODONE, zolpidem  Assessment/Plan:  Principal Problem:  *Renal failure (ARF), acute on chronic Active Problems:  UTI (lower urinary tract infection)  Anemia  Diabetes mellitus type II  Hypertension  Hypokalemia  A-fib  CKD (chronic kidney disease) stage 4, GFR 15-29 ml/min   Acute on CKD Stage 4 -Creatinine from 3.6  on admission to 3.0 today. Her baseline is around 2.1-2.6. -Not on any meds that affect renal function. -She does appear dry clinically.  -IVF were increased yesterday to 75 cc/hr. -Will check renal US to r/o obstruction/hydronephrosis.  Pseudomonas UTI -Culture returned today with pseudomonas, sensitivities are pending. -Was on levaquin and later switched to bactrim 2/2 intolerance. -Bactrim will not cover pseudomonas, so will switch to cipro, which will be renally dosed.  Anemia of Chronic Disease -Hb is around her baseline of 8-10.   Time spent coordinating care: 35 minutes    LOS: 3 days   HERNANDEZ [name],Natasha [facility] Pager: [phone] 12/08/2011, 10:48 AM

## 2011-12-09 DIAGNOSIS — I4891 Unspecified atrial fibrillation: Secondary | ICD-10-CM

## 2011-12-09 LAB — GLUCOSE, CAPILLARY
Glucose-Capillary: 108 mg/dL — ABNORMAL HIGH (ref 70–99)
Glucose-Capillary: 118 mg/dL — ABNORMAL HIGH (ref 70–99)

## 2011-12-09 LAB — BASIC METABOLIC PANEL
CO2: 20 mEq/L (ref 19–32)
Calcium: 8.8 mg/dL (ref 8.4–10.5)
Glucose, Bld: 111 mg/dL — ABNORMAL HIGH (ref 70–99)
Sodium: 137 mEq/L (ref 135–145)

## 2011-12-09 MED ORDER — CIPROFLOXACIN HCL 250 MG PO TABS: 250.0000 mg | ORAL_TABLET | Freq: Every day | ORAL | Status: DC

## 2011-12-09 NOTE — Discharge Summary (Signed)
Physician Discharge Summary  Patient ID: [name] MRN: [id number] DOB/AGE: 76/09/1925 76 y.o.  Admit date: 12/05/2011 Discharge date: 12/09/2011  Primary Care Physician:  Lenora Boys, MD   Discharge Diagnoses:    Principal Problem:  *Renal failure (ARF), acute on chronic Active Problems:  UTI (lower urinary tract infection)  Anemia  Diabetes mellitus type II  Hypertension  Hypokalemia  A-fib  CKD (chronic kidney disease) stage 4, GFR 15-29 ml/min      Medication List     As of 12/09/2011 10:53 AM    STOP taking these medications         ELIQUIS 2.5 MG Tabs tablet   Generic drug: apixaban      furosemide 40 MG tablet   Commonly known as: LASIX      TAKE these medications         ALPRAZolam 0.5 MG tablet   Commonly known as: XANAX   Take 0.5 mg by mouth 2 (two) times daily as needed. For anxiety      amiodarone 300 MG tablet   Commonly known as: PACERONE   Take 1 tablet (300 mg total) by mouth daily.      amitriptyline 50 MG tablet   Commonly known as: ELAVIL   Take 50 mg by mouth at bedtime.      amLODipine 10 MG tablet   Commonly known as: NORVASC   Take 10 mg by mouth daily.      carvedilol 12.5 MG tablet   Commonly known as: COREG   Take 12.5 mg by mouth 2 (two) times daily with a meal.      ciprofloxacin 250 MG tablet   Commonly known as: CIPRO   Take 1 tablet (250 mg total) by mouth daily.      docusate sodium 100 MG capsule   Commonly known as: COLACE   Take 100 mg by mouth daily as needed. For constipation      insulin glargine 100 UNIT/ML injection   Commonly known as: LANTUS   Inject 8 Units into the skin at bedtime.      sitaGLIPtin 25 MG tablet   Commonly known as: JANUVIA   Take 25 mg by mouth daily.      vitamin B-12 1000 MCG tablet   Commonly known as: CYANOCOBALAMIN   Take 1,000 mcg by mouth daily.         Disposition and Follow-up:  Will be discharged home today in stable and improved condition. Has an  appointment on Thursday 9/19 at 9am with the [facility] coumadin clinic as she can no longer be on Eliquis with her current GFR.  Consults:  None   Significant Diagnostic Studies:  US Renal  12/08/2011  *RADIOLOGY REPORT*  Clinical Data: Acute renal failure  RENAL/URINARY TRACT ULTRASOUND COMPLETE  Comparison:  10/14/2009 CT  Findings:  Right Kidney:  Lobular contour.  Increased echogenicity.  Measures 9.7 cm.  No focal abnormality.  No hydronephrosis.  Left Kidney:  Atrophy with lobular contour and increased/heterogeneous echogenicity.  Measures 9 cm.  No hydronephrosis. Arising from the upper pole, there is a 1.2 cm hypoechoic focus with increased through transmission.  This is favored to be a mildly complex cyst. Within the interpolar region, there is an exophytic hypoechoic area that measures approximately 1 cm.  This is poorly characterized however when correlated with comparison CT, a simple cyst was present in this location.  Bladder:  Thin-walled, normal sonographic appearance.  Left pleural effusion incidentally noted.  IMPRESSION:  Atrophic kidneys with increased echogenicity, in keeping with chronic medical renal disease. No hydronephrosis.  Two hypoechoic foci within the left kidney are favored to be mildly complex cysts.  Recommend six month [place] follow-up.   Original Report Authenticated By: Waneta Martins, M.D.     Brief H and P: For complete details please refer to admission H and P, but in brief 78 y.o. female who was sen to the Ed after being seen by her cardiologist Dr. Armanda Magic who found that she had abnormal labs, worsening renal function, and Edema. We were asked to admit her for further evaluation and management.     Hospital Course:  Principal Problem:  *Renal failure (ARF), acute on chronic Active Problems:  UTI (lower urinary tract infection)  Anemia  Diabetes mellitus type II  Hypertension  Hypokalemia  A-fib  CKD (chronic kidney disease) stage 4, GFR 15-29  ml/min   Acute on CKD Stage 4 -Baseline Cr was 2.6, increased to 3.6 on admission. Is 3.0 on DC. -Her Cr has been stable at 3 for the past 3 days, despite IVF. -Renal US without evidence for obstruction/hydronephrosis, but with evidence of medical renal disease. -Likely is a progression of her CKD and this is her new baseline. -Has follow up scheduled with Dr. Kathrene Bongo in a few weeks.  Pseudomonas UTI -Switched to cipro which should be adequate coverage according to sensitivities. -Has been renally dosed to once a day instead of twice a day for an additional 5 days.  AFib -Rate controlled. -We have discontinued Eliquis as it is contraindicated in GFR <15. -Above was d/w Dr. Mayford Knife and she will be started on coumadin. -Has an appointment with the [facility] coumadin clinic in 2 days.  Rest of chronic medical issues have been stable this admission.  Time spent on Discharge: Greater than 30 minutes.   SignedChaya [name] [facility] Pager: [phone] 12/09/2011, 10:53 AM

## 2011-12-09 NOTE — Progress Notes (Signed)
AVS reviewed with pt. Teach back method used. Pt able to verbalize understanding of AVS and questions were answered. IV removed. Pt remains stable. Pt awaiting daughter to arrive for discharge out of facility. [place], Rosanna Randy

## 2011-12-22 ENCOUNTER — Inpatient Hospital Stay (HOSPITAL_COMMUNITY)
Admission: EM | Admit: 2011-12-22 | Discharge: 2011-12-26 | DRG: 682 | Disposition: A | Discharge status: Home Health | Payer: Medicare Other | Attending: Internal Medicine | Admitting: Internal Medicine

## 2011-12-22 ENCOUNTER — Inpatient Hospital Stay (HOSPITAL_COMMUNITY): Payer: Medicare Other

## 2011-12-22 ENCOUNTER — Encounter (HOSPITAL_COMMUNITY): Payer: Self-pay | Admitting: Emergency Medicine

## 2011-12-22 DIAGNOSIS — N390 Urinary tract infection, site not specified: Secondary | ICD-10-CM | POA: Diagnosis present

## 2011-12-22 DIAGNOSIS — I4891 Unspecified atrial fibrillation: Secondary | ICD-10-CM | POA: Diagnosis present

## 2011-12-22 DIAGNOSIS — E118 Type 2 diabetes mellitus with unspecified complications: Secondary | ICD-10-CM | POA: Diagnosis present

## 2011-12-22 DIAGNOSIS — E1139 Type 2 diabetes mellitus with other diabetic ophthalmic complication: Secondary | ICD-10-CM | POA: Diagnosis present

## 2011-12-22 DIAGNOSIS — I350 Nonrheumatic aortic (valve) stenosis: Secondary | ICD-10-CM

## 2011-12-22 DIAGNOSIS — I5033 Acute on chronic diastolic (congestive) heart failure: Secondary | ICD-10-CM

## 2011-12-22 DIAGNOSIS — F411 Generalized anxiety disorder: Secondary | ICD-10-CM | POA: Diagnosis present

## 2011-12-22 DIAGNOSIS — I10 Essential (primary) hypertension: Secondary | ICD-10-CM

## 2011-12-22 DIAGNOSIS — D631 Anemia in chronic kidney disease: Secondary | ICD-10-CM | POA: Diagnosis present

## 2011-12-22 DIAGNOSIS — IMO0002 Reserved for concepts with insufficient information to code with codable children: Secondary | ICD-10-CM | POA: Diagnosis present

## 2011-12-22 DIAGNOSIS — N184 Chronic kidney disease, stage 4 (severe): Secondary | ICD-10-CM | POA: Diagnosis present

## 2011-12-22 DIAGNOSIS — E876 Hypokalemia: Secondary | ICD-10-CM

## 2011-12-22 DIAGNOSIS — S51009A Unspecified open wound of unspecified elbow, initial encounter: Secondary | ICD-10-CM

## 2011-12-22 DIAGNOSIS — E11319 Type 2 diabetes mellitus with unspecified diabetic retinopathy without macular edema: Secondary | ICD-10-CM | POA: Diagnosis present

## 2011-12-22 DIAGNOSIS — N189 Chronic kidney disease, unspecified: Secondary | ICD-10-CM | POA: Diagnosis present

## 2011-12-22 DIAGNOSIS — Z79899 Other long term (current) drug therapy: Secondary | ICD-10-CM

## 2011-12-22 DIAGNOSIS — D649 Anemia, unspecified: Secondary | ICD-10-CM

## 2011-12-22 DIAGNOSIS — E119 Type 2 diabetes mellitus without complications: Secondary | ICD-10-CM

## 2011-12-22 DIAGNOSIS — I129 Hypertensive chronic kidney disease with stage 1 through stage 4 chronic kidney disease, or unspecified chronic kidney disease: Secondary | ICD-10-CM | POA: Diagnosis present

## 2011-12-22 DIAGNOSIS — Z85038 Personal history of other malignant neoplasm of large intestine: Secondary | ICD-10-CM

## 2011-12-22 DIAGNOSIS — I509 Heart failure, unspecified: Secondary | ICD-10-CM

## 2011-12-22 DIAGNOSIS — Z7901 Long term (current) use of anticoagulants: Secondary | ICD-10-CM

## 2011-12-22 DIAGNOSIS — N179 Acute kidney failure, unspecified: Secondary | ICD-10-CM | POA: Diagnosis present

## 2011-12-22 DIAGNOSIS — R1011 Right upper quadrant pain: Secondary | ICD-10-CM

## 2011-12-22 DIAGNOSIS — D696 Thrombocytopenia, unspecified: Secondary | ICD-10-CM | POA: Diagnosis present

## 2011-12-22 DIAGNOSIS — C189 Malignant neoplasm of colon, unspecified: Secondary | ICD-10-CM

## 2011-12-22 HISTORY — DX: Urinary tract infection, site not specified: N39.0

## 2011-12-22 LAB — CBC WITH DIFFERENTIAL/PLATELET
Basophils Absolute: 0 10*3/uL (ref 0.0–0.1)
Basophils Relative: 0 % (ref 0–1)
Eosinophils Relative: 0 % (ref 0–5)
HCT: 28.8 % — ABNORMAL LOW (ref 36.0–46.0)
Hemoglobin: 9.1 g/dL — ABNORMAL LOW (ref 12.0–15.0)
MCHC: 31.6 g/dL (ref 30.0–36.0)
MCV: 90 fL (ref 78.0–100.0)
Monocytes Absolute: 1.6 10*3/uL — ABNORMAL HIGH (ref 0.1–1.0)
Monocytes Relative: 9 % (ref 3–12)
RDW: 14.4 % (ref 11.5–15.5)

## 2011-12-22 LAB — URINALYSIS, ROUTINE W REFLEX MICROSCOPIC
Bilirubin Urine: NEGATIVE
Glucose, UA: NEGATIVE mg/dL
Ketones, ur: NEGATIVE mg/dL
Protein, ur: 30 mg/dL — AB

## 2011-12-22 LAB — GLUCOSE, CAPILLARY: Glucose-Capillary: 110 mg/dL — ABNORMAL HIGH (ref 70–99)

## 2011-12-22 LAB — COMPREHENSIVE METABOLIC PANEL
ALT: 10 U/L (ref 0–35)
AST: 24 U/L (ref 0–37)
Albumin: 2.9 g/dL — ABNORMAL LOW (ref 3.5–5.2)
Alkaline Phosphatase: 124 U/L — ABNORMAL HIGH (ref 39–117)
BUN: 52 mg/dL — ABNORMAL HIGH (ref 6–23)
Chloride: 94 mEq/L — ABNORMAL LOW (ref 96–112)
Potassium: 4.3 mEq/L (ref 3.5–5.1)
Sodium: 131 mEq/L — ABNORMAL LOW (ref 135–145)
Total Bilirubin: 0.5 mg/dL (ref 0.3–1.2)

## 2011-12-22 LAB — URINE MICROSCOPIC-ADD ON

## 2011-12-22 MED ORDER — SODIUM CHLORIDE 0.9 % IJ SOLN
3.0000 mL | Freq: Two times a day (BID) | INTRAMUSCULAR | Status: DC
  Administered 2011-12-23 – 2011-12-26 (×6): 3 mL via INTRAVENOUS

## 2011-12-22 MED ORDER — INSULIN GLARGINE 100 UNIT/ML ~~LOC~~ SOLN
5.0000 [IU] | Freq: Every day | SUBCUTANEOUS | Status: DC
  Administered 2011-12-23: 5 [IU] via SUBCUTANEOUS

## 2011-12-22 MED ORDER — DEXTROSE 5 % IV SOLN
1.0000 g | INTRAVENOUS | Status: DC
  Administered 2011-12-22 – 2011-12-24 (×3): 1 g via INTRAVENOUS
  Filled 2011-12-22 (×4): qty 1

## 2011-12-22 MED ORDER — ONDANSETRON HCL 4 MG/2ML IJ SOLN: 4.0000 mg | Freq: Four times a day (QID) | INTRAMUSCULAR | Status: DC | PRN

## 2011-12-22 MED ORDER — SODIUM CHLORIDE 0.9 % IV SOLN
1000.0000 mL | INTRAVENOUS | Status: DC
  Administered 2011-12-22: 1000 mL via INTRAVENOUS

## 2011-12-22 MED ORDER — SODIUM CHLORIDE 0.9 % IV BOLUS (SEPSIS)
500.0000 mL | Freq: Once | INTRAVENOUS | Status: AC
  Administered 2011-12-22: 500 mL via INTRAVENOUS

## 2011-12-22 MED ORDER — AMIODARONE HCL 200 MG PO TABS
200.0000 mg | ORAL_TABLET | Freq: Every day | ORAL | Status: DC
  Administered 2011-12-23 – 2011-12-26 (×4): 200 mg via ORAL
  Filled 2011-12-22 (×4): qty 1

## 2011-12-22 MED ORDER — AMITRIPTYLINE HCL 50 MG PO TABS
50.0000 mg | ORAL_TABLET | Freq: Every day | ORAL | Status: DC
  Administered 2011-12-22 – 2011-12-25 (×4): 50 mg via ORAL
  Filled 2011-12-22 (×6): qty 1

## 2011-12-22 MED ORDER — DEXTROSE 5 % IV SOLN
1.0000 g | INTRAVENOUS | Status: DC
  Administered 2011-12-22: 1 g via INTRAVENOUS
  Filled 2011-12-22: qty 10

## 2011-12-22 MED ORDER — ACETAMINOPHEN 500 MG PO TABS
500.0000 mg | ORAL_TABLET | Freq: Four times a day (QID) | ORAL | Status: DC | PRN
  Administered 2011-12-22 – 2011-12-23 (×2): 1000 mg via ORAL
  Filled 2011-12-22 (×2): qty 2

## 2011-12-22 MED ORDER — VITAMIN B-12 1000 MCG PO TABS
1000.0000 ug | ORAL_TABLET | Freq: Every day | ORAL | Status: DC
  Administered 2011-12-23 – 2011-12-26 (×4): 1000 ug via ORAL
  Filled 2011-12-22 (×4): qty 1

## 2011-12-22 MED ORDER — LOPERAMIDE HCL 2 MG PO CAPS: 2.0000 mg | ORAL_CAPSULE | Freq: Once | ORAL | Status: DC

## 2011-12-22 MED ORDER — INSULIN ASPART 100 UNIT/ML ~~LOC~~ SOLN
0.0000 [IU] | Freq: Three times a day (TID) | SUBCUTANEOUS | Status: DC
  Administered 2011-12-23 – 2011-12-25 (×3): 2 [IU] via SUBCUTANEOUS
  Administered 2011-12-26: 1 [IU] via SUBCUTANEOUS

## 2011-12-22 MED ORDER — ONDANSETRON HCL 4 MG PO TABS: 4.0000 mg | ORAL_TABLET | Freq: Four times a day (QID) | ORAL | Status: DC | PRN

## 2011-12-22 MED ORDER — FUROSEMIDE 40 MG PO TABS
40.0000 mg | ORAL_TABLET | Freq: Every day | ORAL | Status: DC
  Filled 2011-12-22: qty 1

## 2011-12-22 MED ORDER — ALPRAZOLAM 0.5 MG PO TABS
0.5000 mg | ORAL_TABLET | Freq: Two times a day (BID) | ORAL | Status: DC | PRN
  Administered 2011-12-22 – 2011-12-26 (×5): 0.5 mg via ORAL
  Filled 2011-12-22 (×5): qty 1

## 2011-12-22 NOTE — ED Notes (Signed)
Pt states that it started to hurt to void again last week has had the chills and no appetite. Pt went to dr today and was sent for admission for antiobiotics IV and fluids

## 2011-12-22 NOTE — ED Provider Notes (Signed)
History     CSN: [id number]  Arrival date & time 12/22/11  1350   First MD Initiated Contact with Patient 12/22/11 1358     Chief complaint: UTI  HPI The patient has history of recently being in the hospital for urinary tract infection about 3 weeks ago. Patient states she improved after being in the hospital but over the last week she started to feel poorly again. She has noted discomfort with urination. She has had general malaise and overall weakness. Feels like her energy is also decreased. She thinks she started developing some chills last night. She's not measured any specific fever.  Patient went to her primary doctor's office today who did a urinalysis suggesting recurrent UTI. She was instructed to come to the hospital to be admitted. Past Medical History  Diagnosis Date  . Colon cancer   . Hypertension   . Renal insufficiency, mild   . Anxiety   . Diabetes mellitus     diabetic retinopathy  . Anemia   . Aortic stenosis, mild   . Urinary tract infection     Past Surgical History  Procedure Date  . Abdominal surgery   . Abdominal hysterectomy   . Colon surgery   . Appendectomy   . Colostomy   . Cardioversion 09/30/2011    Procedure: CARDIOVERSION;  Surgeon: Quintella Reichert, MD;  Location: [facility] OR;  Service: Cardiovascular;  Laterality: N/A;  . I&d extremity 11/20/2011    Procedure: IRRIGATION AND DEBRIDEMENT EXTREMITY;  Surgeon: Harvie Junior, MD;  Location: [facility] OR;  Service: Orthopedics;  Laterality: Left;    No family history on file.  History  Substance Use Topics  . Smoking status: Never Smoker   . Smokeless tobacco: Current User  . Alcohol Use: No    OB History    Grav Para Term Preterm Abortions TAB SAB Ect Mult Living                  Review of Systems  All other systems reviewed and are negative.    Allergies  Review of patient's allergies indicates no known allergies.  Home Medications   Current Outpatient Rx  Name Route Sig Dispense Refill  .  ALPRAZOLAM 0.5 MG PO TABS Oral Take 0.5 mg by mouth 2 (two) times daily as needed. For anxiety    . AMIODARONE HCL 300 MG PO TABS Oral Take 1 tablet (300 mg total) by mouth daily. 30 tablet 0  . AMITRIPTYLINE HCL 50 MG PO TABS Oral Take 50 mg by mouth at bedtime.    Marland Kitchen AMLODIPINE BESYLATE 10 MG PO TABS Oral Take 10 mg by mouth daily.    Marland Kitchen CARVEDILOL 12.5 MG PO TABS Oral Take 12.5 mg by mouth 2 (two) times daily with a meal.    . CIPROFLOXACIN HCL 250 MG PO TABS Oral Take 1 tablet (250 mg total) by mouth daily. 5 tablet 0  . DOCUSATE SODIUM 100 MG PO CAPS Oral Take 100 mg by mouth daily as needed. For constipation    . INSULIN GLARGINE 100 UNIT/ML Relampago SOLN Subcutaneous Inject 8 Units into the skin at bedtime.    Marland Kitchen SITAGLIPTIN PHOSPHATE 25 MG PO TABS Oral Take 25 mg by mouth daily.    Marland Kitchen VITAMIN B-12 1000 MCG PO TABS Oral Take 1,000 mcg by mouth daily.      BP 100/69  Pulse 70  Temp 97.7 F (36.5 C) (Oral)  Resp 20  SpO2 96%  Physical Exam  Nursing  note and vitals reviewed. Constitutional: She appears well-developed and well-nourished. No distress.  HENT:  Head: Normocephalic and atraumatic.  Right Ear: External ear normal.  Left Ear: External ear normal.  Eyes: Conjunctivae normal are normal. Right eye exhibits no discharge. Left eye exhibits no discharge. No scleral icterus.  Neck: Neck supple. No tracheal deviation present.  Cardiovascular: Normal rate, regular rhythm and intact distal pulses.   Pulmonary/Chest: Effort normal and breath sounds normal. No stridor. No respiratory distress. She has no wheezes. She has no rales.  Abdominal: Soft. Bowel sounds are normal. She exhibits no distension. There is no tenderness. There is no rebound and no guarding.  Musculoskeletal: She exhibits no edema and no tenderness.       Mild tenderness in the bilateral flank regions  Neurological: She is alert. She has normal strength. No sensory deficit. Cranial nerve deficit:  no gross defecits noted.  She exhibits normal muscle tone. She displays no seizure activity. Coordination normal.  Skin: Skin is warm and dry. No rash noted.  Psychiatric: She has a normal mood and affect.    ED Course  Procedures (including critical care time) EKG Rate 60 right axis ATRIAL FIBRILLATION ~ ? Atrial activity RBBB AND LPFB ~ QRSd >113mS, axis(90,210) No old ekg Labs Reviewed  COMPREHENSIVE METABOLIC PANEL - Abnormal; Notable for the following:    Sodium 131 (*)     Chloride 94 (*)     Glucose, Bld 122 (*)     BUN 52 (*)     Creatinine, Ser 4.14 (*)     Albumin 2.9 (*)     Alkaline Phosphatase 124 (*)     GFR calc non Af Amer 9 (*)     GFR calc Af Amer 11 (*)     All other components within normal limits  CBC WITH DIFFERENTIAL - Abnormal; Notable for the following:    WBC 18.0 (*)     RBC 3.20 (*)     Hemoglobin 9.1 (*)     HCT 28.8 (*)     Platelets 112 (*)  PLATELET COUNT CONFIRMED BY SMEAR   Neutrophils Relative 81 (*)     Neutro Abs 14.5 (*)     Lymphocytes Relative 10 (*)     Monocytes Absolute 1.6 (*)     All other components within normal limits  URINALYSIS, ROUTINE W REFLEX MICROSCOPIC  URINE CULTURE   No results found.   1. UTI (lower urinary tract infection)   2. Chronic renal failure       MDM  Patient's outpatient urinalysis suggested recurrent UTI. Patient does have worsening renal failure and leukocytosis. Anemia appears stable. Patient be admitted to the hospital for IV antibiotics, hydration and further treatment        Celene Kras, MD 12/22/11 1539

## 2011-12-22 NOTE — Progress Notes (Signed)
Disposition Note  [name], is a 77 y.o. female,   MRN: [id number]  -  DOB - Mar 01, 1926  Outpatient Primary MD for the patient is FRIED, ROBERT L, MD   Blood pressure 97/67, pulse 70, temperature 97.7 F (36.5 C), temperature source Oral, resp. rate 16, SpO2 100.00%.  Active Problems:  Acute on chronic renal failure  UTI (lower urinary tract infection)  Anemia  Diabetes mellitus type II  Hypertension  A-fib    78 yo female discharged from the hospital approx two weeks ago after being treated for a UTI with acute on chronic renal failure.  Baseline creatine appears to be approx 3.0.  Creatinine elevated to 4.14 in ED. Today she returns with same.  She has a WBC of 18.  Her BP is soft.  I have requested a step down bed.  Urine cultures have been sent.  Rocephin given in Ed.  Algis Downs, PA-C [facility] Pager: [phone]

## 2011-12-22 NOTE — ED Notes (Signed)
MD at bedside. 

## 2011-12-22 NOTE — ED Notes (Addendum)
Orders for Rocephin 1g carried out at 1625. Order discontinued at 1702 per MD. Rocephin dose previously infused as ordered.

## 2011-12-22 NOTE — ED Notes (Signed)
Rn called floor to give report. Unable to give floor report at this time.

## 2011-12-22 NOTE — ED Notes (Signed)
Was d/c from [place] 3 weeks ago for same uti and went to [facility] dr today and sentt to hospital for ? Iv antibiotics pt has had chills last night and weakness and decreased engery

## 2011-12-22 NOTE — Consult Note (Addendum)
ANTIBIOTIC CONSULT NOTE - INITIAL  Pharmacy Consult for Cefepime, Coumadin Indication: UTI, Afib  Allergies  Allergen Reactions  . Oxycodone Other (See Comments)    Abnormal behavior    Patient Measurements: Height: 5' 2.99" (160 cm) Weight: 194 lb 7.1 oz (88.2 kg) IBW/kg (Calculated) : 52.38   Vital Signs: Temp: 97.7 F (36.5 C) (09/30 1355) Temp src: Oral (09/30 1355) BP: 97/67 mmHg (09/30 1525) Pulse Rate: 70  (09/30 1525) Intake/Output from previous day:   Intake/Output from this shift:    Labs:  Basename 12/22/11 1434  WBC 18.0*  HGB 9.1*  PLT 112*  LABCREA --  CREATININE 4.14*   Estimated Creatinine Clearance: 10.8 ml/min (by C-G formula based on Cr of 4.14).  Medical History: Past Medical History  Diagnosis Date  . Colon cancer   . Hypertension   . Renal insufficiency, mild   . Anxiety   . Diabetes mellitus     diabetic retinopathy  . Anemia   . Aortic stenosis, mild   . Urinary tract infection    Assessment: 85yof recently discharged from [facility] (9/17) after admission for Pseudomonas UTI and acute on chronic renal failure returns to ED with painful urination and chills. Repeat UA suspicious for another UTI - culture pending. Serum creatnine is elevated at 4.14 with CrCl ~ 13ml/min.  9/13 urine cx>>Pseudomonas (S-Cefepime, Ceftazidime, Cipro, Imipenem, Zosyn, R-Gentamicin, I-Tobramycin)  Last admission she was empirically treated with Levaquin which caused nausea so then switched to Bactrim and finally was sent home on oral Cipro once cultures resulted. She will now empirically begin Cefepime pending culture results.   Patient is also on coumadin pta (2.5mg  daily) for afib. INR on admission is supratherapeutic at 3.62. Last dose 9/29.  Goal of Therapy:  Appropriate Cefepime dosing INR 2-3  Plan:  1) Cefepime 1g IV q24 2) Follow renal function and culture results 3) No coumadin tonight 4) Daily INR  Fredrik Rigger 12/22/2011,4:55 PM

## 2011-12-22 NOTE — H&P (Signed)
[facility] History and Physical  [name] EAV:[id number] DOB: 04-Mar-1926 DOA: 12/22/2011   PCP: Lenora Boys, MD   Chief Complaint:  Weak, nauseous, persistent right upper quadrant abdominal pain  HPI: [name] is a 78 y.o. female with history of chronic disease, atrial fibrillation, congestive heart failure, chronic Coumadin who was recently discharged from [facility] after being admitted for acute on chronic renal failure and Pseudomonas urinary tract infection was brought back by family for persistent right upper quadrant abdominal pain, nausea, decreased appetite, increased weakness. In the emergency room the patient was found to have worsening renal failure hypotension leukocytosis and was referred for admission.  Review of Systems:   patient with generalized weakness at home also please note positive review of systems as per history of present illness The patient denies weight loss,, vision loss, decreased hearing, hoarseness, chest pain, syncope, dyspnea on exertion,  hemoptysis,  melena, hematochezia, severe indigestion/heartburn, hematuria, incontinence, genital sores, , suspicious skin lesions, transient blindness,  depression,   Past Medical History  Diagnosis Date  . Colon cancer   . Hypertension   . Renal insufficiency, mild   . Anxiety   . Diabetes mellitus     diabetic retinopathy  . Anemia   . Aortic stenosis, mild   . Urinary tract infection    Past Surgical History  Procedure Date  . Abdominal surgery   . Abdominal hysterectomy   . Colon surgery   . Appendectomy   . Colostomy   . Cardioversion 09/30/2011    Procedure: CARDIOVERSION;  Surgeon: Quintella Reichert, MD;  Location: [facility] OR;  Service: Cardiovascular;  Laterality: N/A;  . I&d extremity 11/20/2011    Procedure: IRRIGATION AND DEBRIDEMENT EXTREMITY;  Surgeon: Harvie Junior, MD;  Location: [facility] OR;  Service: Orthopedics;  Laterality: Left;   Social History:  reports that she has never  smoked. She uses smokeless tobacco. She reports that she does not drink alcohol or use illicit drugs. The patient has been living with her daughter who helps her with all activities of daily living  Allergies  Allergen Reactions  . Levaquin (Levofloxacin In D5w) Nausea Only  . Oxycodone Other (See Comments)    Abnormal behavior    Family history is positive for hypertension  Prior to Admission medications   Medication Sig Start Date End Date Taking? Authorizing Provider  acetaminophen (TYLENOL) 500 MG tablet Take 500-1,000 mg by mouth every 6 (six) hours as needed. fever   Yes Historical Provider, MD  ALPRAZolam (XANAX) 0.5 MG tablet Take 0.5 mg by mouth 2 (two) times daily as needed. For anxiety   Yes Historical Provider, MD  amiodarone (PACERONE) 200 MG tablet Take 100-200 mg by mouth daily. Takes 200 mg in the morning and 100 mg at night   Yes Historical Provider, MD  amitriptyline (ELAVIL) 50 MG tablet Take 50 mg by mouth at bedtime.   Yes Historical Provider, MD  amLODipine (NORVASC) 10 MG tablet Take 10 mg by mouth daily.   Yes Historical Provider, MD  carvedilol (COREG) 12.5 MG tablet Take 12.5 mg by mouth 2 (two) times daily with a meal.   Yes Historical Provider, MD  docusate sodium (COLACE) 100 MG capsule Take 100 mg by mouth daily as needed. For constipation   Yes Historical Provider, MD  furosemide (LASIX) 40 MG tablet Take 40 mg by mouth 2 (two) times daily.   Yes Historical Provider, MD  insulin glargine (LANTUS) 100 UNIT/ML injection Inject 8 Units  into the skin at bedtime.   Yes Historical Provider, MD  sitaGLIPtin (JANUVIA) 25 MG tablet Take 25 mg by mouth daily.   Yes Historical Provider, MD  vitamin B-12 (CYANOCOBALAMIN) 1000 MCG tablet Take 1,000 mcg by mouth daily.   Yes Historical Provider, MD  warfarin (COUMADIN) 5 MG tablet Take 2.5 mg by mouth daily. 2.5 mg 7 days week   Yes Historical Provider, MD   Physical Exam: Filed Vitals:   12/22/11 1713 12/22/11 1715  12/22/11 1803 12/22/11 1851  BP:    129/70  Pulse:   76 79  Temp:    99 F (37.2 C)  TempSrc:    Oral  Resp:    20  Height:      Weight:      SpO2: 88% 96% 95% 92%     General:  Alert and oriented x3  Eyes: Pupil equal round react to light accommodation, conjunctiva pale, sclera anicteric  ENT: Clear oral mucosa  Neck: Bilateral JVD  Cardiovascular: Regular rate and rhythm without murmurs rubs or gallops  Respiratory: Clear to auscultation anterior fields  Abdomen: Soft, positive right upper quadrant tenderness no rebound no guarding  Skin: Pale dry multiple rashes  Musculoskeletal: Intact  Psychiatric: Euthymic  Neurologic: Cranial nerves 2-12 intact, strength 5 out of 5 in all 4 extremities, sensation intact  Labs on Admission:  Basic Metabolic Panel:  Lab 12/22/11 1610  NA 131*  K 4.3  CL 94*  CO2 24  GLUCOSE 122*  BUN 52*  CREATININE 4.14*  CALCIUM 8.9  MG --  PHOS --   Liver Function Tests:  Lab 12/22/11 1434  AST 24  ALT 10  ALKPHOS 124*  BILITOT 0.5  PROT 6.5  ALBUMIN 2.9*   No results found for this basename: LIPASE:5,AMYLASE:5 in the last 168 hours No results found for this basename: AMMONIA:5 in the last 168 hours CBC:  Lab 12/22/11 1434  WBC 18.0*  NEUTROABS 14.5*  HGB 9.1*  HCT 28.8*  MCV 90.0  PLT 112*   Cardiac Enzymes: No results found for this basename: CKTOTAL:5,CKMB:5,CKMBINDEX:5,TROPONINI:5 in the last 168 hours  BNP (last 3 results)  Basename 12/05/11 2205 11/20/11 1544 08/27/11 1931  PROBNP 4125.0* 4130.0* 4980.0*   CBG:  Lab 12/22/11 1855  GLUCAP 97    Radiological Exams on Admission: No results found.   Assessment/Plan Principal Problem:  *Renal failure (ARF), acute on chronic Active Problems:  Anemia  Diabetes mellitus type II  Hypertension  UTI (lower urinary tract infection)  A-fib  CKD (chronic kidney disease) stage 4, GFR 15-29 ml/min  RUQ abdominal pain  Acute on chronic diastolic CHF  (congestive heart failure), NYHA class 2  Thrombocytopenia  Colon cancer   1. Acute on chronic renal failure-suspect progression of chronic kidney disease. Patient not candidate for IV fluids as she is or the massive volume overloaded. Will consult [facility] to see if there is any reversible component or if we need to prepare the patient for hemodialysis 2. Right upper quadrant abdominal pain-according to the family the patient has been diagnosed with gallstones before. We'll obtain an abdominal ultrasound and start empiric antibiotics 3. History of Pseudomonas urinary tract infection with probable recurrence. Start IV cefepime. send urine culture 4. Atrial fibrillation status post direct-current cardioversion. Sinus rhythm maintained on amiodarone. Start full dose Coumadin per pharmacy  Code Status: Full code Family Communication: Daughter at bedside Disposition Plan: Unclear  Time spent: One hour  Sadye Kiernan [facility] Pager [phone]  If 7PM-7AM, please contact night-coverage [email or web address] Password [facility] 12/22/2011, 7:26 PM

## 2011-12-23 ENCOUNTER — Inpatient Hospital Stay (HOSPITAL_COMMUNITY): Payer: Medicare Other

## 2011-12-23 ENCOUNTER — Encounter (HOSPITAL_COMMUNITY): Payer: Self-pay | Admitting: General Practice

## 2011-12-23 DIAGNOSIS — I359 Nonrheumatic aortic valve disorder, unspecified: Secondary | ICD-10-CM

## 2011-12-23 LAB — BASIC METABOLIC PANEL
GFR calc Af Amer: 10 mL/min — ABNORMAL LOW (ref >90)
GFR calc non Af Amer: 9 mL/min — ABNORMAL LOW (ref >90)
Glucose, Bld: 83 mg/dL (ref 70–99)
Potassium: 3.9 mEq/L (ref 3.5–5.1)
Sodium: 133 mEq/L — ABNORMAL LOW (ref 135–145)

## 2011-12-23 LAB — CBC
Hemoglobin: 8.7 g/dL — ABNORMAL LOW (ref 12.0–15.0)
MCH: 29.5 pg (ref 26.0–34.0)
RBC: 2.95 MIL/uL — ABNORMAL LOW (ref 3.87–5.11)
WBC: 14.9 10*3/uL — ABNORMAL HIGH (ref 4.0–10.5)

## 2011-12-23 LAB — GLUCOSE, CAPILLARY
Glucose-Capillary: 96 mg/dL (ref 70–99)
Glucose-Capillary: 195 mg/dL — ABNORMAL HIGH (ref 70–99)

## 2011-12-23 LAB — IRON AND TIBC
Saturation Ratios: 6 % — ABNORMAL LOW (ref 20–55)
TIBC: 185 ug/dL — ABNORMAL LOW (ref 250–470)
UIBC: 173 ug/dL (ref 125–400)

## 2011-12-23 LAB — CREATININE, URINE, RANDOM: Creatinine, Urine: 33.3 mg/dL

## 2011-12-23 LAB — SODIUM, URINE, RANDOM: Sodium, Ur: 42 mEq/L

## 2011-12-23 LAB — PROTIME-INR: INR: 3.93 — ABNORMAL HIGH (ref 0.00–1.49)

## 2011-12-23 MED ORDER — DOCUSATE SODIUM 100 MG PO CAPS
100.0000 mg | ORAL_CAPSULE | Freq: Two times a day (BID) | ORAL | Status: DC
  Administered 2011-12-23 – 2011-12-25 (×2): 100 mg via ORAL
  Filled 2011-12-23 (×9): qty 1

## 2011-12-23 MED ORDER — TECHNETIUM TC 99M MEBROFENIN IV KIT
5.0000 | PACK | Freq: Once | INTRAVENOUS | Status: AC | PRN
  Administered 2011-12-23: 5 via INTRAVENOUS

## 2011-12-23 NOTE — Care Management Note (Signed)
    Page 1 of 2   12/26/2011     12:30:57 PM   CARE MANAGEMENT NOTE 12/26/2011  Patient:  [name], [name]   Account Number:  [id number]  Date Initiated:  12/23/2011  Documentation initiated by:  GRAVES-BIGELOW,Kawanda Drumheller  Subjective/Objective Assessment:   Pt admitted with acute on chronic RF/ recurrent UTI on IV abx therapy.     Action/Plan:   CM will continue to monitor for disposition nneds.   Anticipated DC Date:  12/26/2011   Anticipated DC Plan:  HOME W HOME HEALTH SERVICES      DC Planning Services  CM consult      [facility] Choice  HOME HEALTH   Choice offered to / List presented to:  C-1 Patient        HH arranged  HH-1 RN  HH-2 PT  HH-3 OT  HH-10 DISEASE MANAGEMENT      HH agency  [facility]   Status of service:  Completed, signed off Medicare Important Message given?   (If response is "NO", the following Medicare IM given date fields will be blank) Date Medicare IM given:   Date Additional Medicare IM given:    Discharge Disposition:  HOME W HOME HEALTH SERVICES  Per UR Regulation:  Reviewed for med. necessity/level of care/duration of stay  If discussed at Long Length of Stay Meetings, dates discussed:    Comments:  12-26-11 [address] [place], [place] [phone] CM spoke to pt and she will be going home with daughter. Cm made referral for services and SOC to begin within 24-48 hours post d/c. No further needs from CM at this time.

## 2011-12-23 NOTE — Progress Notes (Signed)
UR Completed Antinette Keough Graves-Bigelow, RN,BSN [phone]  

## 2011-12-23 NOTE — Progress Notes (Signed)
[facility] PROGRESS NOTE  [name]:[id number] DOB: 11-14-1925 DOA: 12/22/2011 PCP: Lenora Boys, MD  Assessment/Plan: Principal Problem:  *Renal failure (ARF), acute on chronic Active Problems:  Anemia  Diabetes mellitus type II  Hypertension  UTI (lower urinary tract infection)  A-fib  CKD (chronic kidney disease) stage 4, GFR 15-29 ml/min  RUQ abdominal pain  Acute on chronic diastolic CHF (congestive heart failure), NYHA class 2  Thrombocytopenia  Colon cancer  1. Recurrent UTI - last Cx pseudomonas - seems to do better - started on iv Cefepime from admission 12/23/11- await repeat culture. Improved after 24 hrs 2. Acute on chronic renal failure with volume overload and hypotension- unclear what is the precipitating factor - consulted nephrology Dr. Allena Katz on 12/23/11 3. RUQ abd pain reported on admission - improved after iv abx. Abd [place] without gallstones, but with distended GB - HIDA scan pending.  4.  Atrial fibrillation status post direct-current cardioversion. Sinus rhythm maintained on amiodarone. Continue full dose Coumadin per pharmacy  5. Anemia - chronic due to CKD and chronic ds.  6. Remote hx of colon cancer with chronic colostomy since 1992 - has large asymptomatic parastomal hernia.  7. DM 2 - as renal function is worse home lantus dose has been decreased. Cont ssi and lantus 5 QHS   Code Status: full Family Communication: daughter Disposition Plan: home   Brief narrative: [name] is a 78 y.o. female with history of chronic disease, atrial fibrillation, congestive heart failure, chronic Coumadin who was recently discharged from [facility] after being admitted for acute on chronic renal failure and Pseudomonas urinary tract infection was brought back by family for persistent right upper quadrant abdominal pain, nausea, decreased appetite, increased weakness. In the emergency room the patient was found to have worsening renal failure  hypotension leukocytosis and was referred for admission.   Consultants:  [facility]  Procedures:  Abdominal US  Antibiotics:  Cefepime 9/30  HPI/Subjective: Feels better   Objective: Filed Vitals:   12/22/11 1851 12/22/11 2100 12/23/11 0500 12/23/11 0657  BP: 129/70 135/74 99/63 102/63  Pulse: 79 85 85   Temp: 99 F (37.2 C) 98.9 F (37.2 C) 98.9 F (37.2 C)   TempSrc: Oral     Resp: 20 18 16    Height:      Weight:      SpO2: 92% 93% 91%     Intake/Output Summary (Last 24 hours) at 12/23/11 0914 Last data filed at 12/22/11 2130  Gross per 24 hour  Intake      0 ml  Output    250 ml  Net   -250 ml   Filed Weights   12/22/11 1537  Weight: 88.2 kg (194 lb 7.1 oz)    Exam:   General:  axox3  Cardiovascular: RRR, no M,R,G  Respiratory: CTAB   Abdomen: soft, minimal RUQ tenderness  Data Reviewed: Basic Metabolic Panel:  Lab 12/23/11 8119 12/22/11 1434  NA 133* 131*  K 3.9 4.3  CL 97 94*  CO2 21 24  GLUCOSE 83 122*  BUN 55* 52*  CREATININE 4.37* 4.14*  CALCIUM 8.5 8.9  MG -- --  PHOS -- --   Liver Function Tests:  Lab 12/22/11 1434  AST 24  ALT 10  ALKPHOS 124*  BILITOT 0.5  PROT 6.5  ALBUMIN 2.9*   No results found for this basename: LIPASE:5,AMYLASE:5 in the last 168 hours No results found for this basename: AMMONIA:5 in the last  168 hours CBC:  Lab 12/23/11 0650 12/22/11 1434  WBC 14.9* 18.0*  NEUTROABS -- 14.5*  HGB 8.7* 9.1*  HCT 26.0* 28.8*  MCV 88.1 90.0  PLT 104* 112*   Cardiac Enzymes: No results found for this basename: CKTOTAL:5,CKMB:5,CKMBINDEX:5,TROPONINI:5 in the last 168 hours BNP (last 3 results)  Basename 12/05/11 2205 11/20/11 1544 08/27/11 1931  PROBNP 4125.0* 4130.0* 4980.0*   CBG:  Lab 12/23/11 0743 12/22/11 2019 12/22/11 1855  GLUCAP 96 110* 97    No results found for this or any previous visit (from the past 240 hour(s)).   Studies: US Abdomen Complete  12/22/2011  *RADIOLOGY REPORT*   Clinical Data:  Right upper quadrant abdominal pain and fever. History of gallstones.  COMPLETE ABDOMINAL ULTRASOUND  Comparison:  Renal ultrasound dated 12/08/2011 and abdomen and pelvis CT dated 10/14/2009.  Findings:  Gallbladder:  No gallstones, gallbladder wall thickening, or pericholecystic fluid.  Common bile duct:  Normal, measuring 5.5 mm in diameter proximally.  Liver:  No focal lesion identified.  Within normal limits in parenchymal echogenicity.  IVC:  Appears normal.  Pancreas:  No pancreatic tissue visualized at the location of the expected position of the pancreas.  Spleen:  Normal, measuring 11.6 cm in length.  Right Kidney:  Diffuse parenchymal thinning and prominent renal sinus fat, measuring 11.4 cm in length.  Mildly echogenic.  No hydronephrosis.  Left Kidney:  Diffuse parenchymal thinning and prominent renal sinus fat, measuring 9.2 cm in length.  Mildly echogenic.  As a seen probable mildly complex cysts are not currently visualized. No hydronephrosis.  Abdominal aorta:  Normal in caliber.  Bilateral pleural effusions are noted, greater on the right.  IMPRESSION:  1.  No acute abdominal abnormality. 2.  Probable severe pancreatic atrophy. 3.  Bilateral renal atrophy and evidence of chronic medical renal disease. 4.  Bilateral pleural effusions, greater on the right.   Original Report Authenticated By: Darrol Angel, M.D.     Scheduled Meds:   . amiodarone  200 mg Oral Daily  . amitriptyline  50 mg Oral QHS  . ceFEPime (MAXIPIME) IV  1 g Intravenous Q24H  . furosemide  40 mg Oral Daily  . insulin aspart  0-9 Units Subcutaneous TID WC  . insulin glargine  5 Units Subcutaneous QHS  . sodium chloride  500 mL Intravenous Once  . sodium chloride  3 mL Intravenous Q12H  . vitamin B-12  1,000 mcg Oral Daily  . DISCONTD: cefTRIAXone (ROCEPHIN)  IV  1 g Intravenous Q24H  . DISCONTD: loperamide  2 mg Oral Once   Continuous Infusions:   . DISCONTD: sodium chloride 1,000 mL (12/22/11  1508)    Principal Problem:  *Renal failure (ARF), acute on chronic Active Problems:  Anemia  Diabetes mellitus type II  Hypertension  UTI (lower urinary tract infection)  A-fib  CKD (chronic kidney disease) stage 4, GFR 15-29 ml/min  RUQ abdominal pain  Acute on chronic diastolic CHF (congestive heart failure), NYHA class 2  Thrombocytopenia  Colon cancer     Lynnie Koehler  [facility] Pager [phone]. If 8PM-8AM, please contact night-coverage at [email or web address], password [facility], [facility], [facility] 12/23/2011, 9:14 AM  LOS: 1 day

## 2011-12-23 NOTE — Consult Note (Signed)
[facility] Consult Note [facility]  Date: 12/23/2011  Patient name: [name] Medical record number: [id number] Date of birth: October 24, 1925 Age: 78 y.o. Gender: female PCP: FRIED, Doris Cheadle, MD  Medical Service: [facility]  Conulting physician: Dr. Lonia Blood  Consulted for the evaluation and management of ARF on CKD4    Chief Complaint: Abd pain  History of Present Illness: [name] is a 78 yo female with a PMH of Stage 4 CKD, DM2, A. Fib, HTN, colon cancer s/p colostomy, anemia, and chronic diastolic CHF. She was recently hospitalized 9/13 for a UTI and discharged on 9/17, she presented to the her PCP yesterday with complaints of abd pain, chills, weakness, fatigue, and dysuria. A UA showed a UTI and she was sent to the ED for admission. Today she states she is feeling much better. Denies abd pain, chills, dysuria, chest pain, SOB, nausea, and vomiting. We have been consulted for evaluation of her worsening Cr function. Our records show a baseline Cr around 2.0 which has been steadily increasing since July (Cr 2.04 7/5, 2.38 7/19, 2.32 7/26, 2.4 8/28, 2.13 8/29, 3.70 9/13, 3.08 9/17, 3.58 9/19, 4.14 on admission and 4.37 today).  She denies any preceding vomiting or diarrhea however states that she suffers nausea and occasional "dry heaves" with increased thirst. Denies any preceding use of nonsteroidal anti-inflammatory drugs. Recently completed a course of oral ciprofloxacin for a urinary tract infection. Denies any emerging skin rash, pruritus however continues to have fevers chills and right upper quadrant pain. About a month ago, was exposed to 3 days of intravenous gentamicin for a contaminated left elbow wound status post I&D. Has had rather significant interim period with episodes of furosemide therapy/with holding of diuretics based on volume status. Denies any gross hematuria, denies worsening foamy urine and states that pedal edema  looks much better over the past few days. Denies any epistaxis, hemoptysis or tinnitus. Denies any emerging skin lesions over hands or toes.   Meds: Prior to Admission medications   Medication Sig Start Date End Date Taking? Authorizing Provider  acetaminophen (TYLENOL) 500 MG tablet Take 500-1,000 mg by mouth every 6 (six) hours as needed. fever   Yes Historical Provider, MD  ALPRAZolam (XANAX) 0.5 MG tablet Take 0.5 mg by mouth 2 (two) times daily as needed. For anxiety   Yes Historical Provider, MD  amiodarone (PACERONE) 200 MG tablet Take 100-200 mg by mouth daily. Takes 200 mg in the morning and 100 mg at night   Yes Historical Provider, MD  amitriptyline (ELAVIL) 50 MG tablet Take 50 mg by mouth at bedtime.   Yes Historical Provider, MD  amLODipine (NORVASC) 10 MG tablet Take 10 mg by mouth daily.   Yes Historical Provider, MD  carvedilol (COREG) 12.5 MG tablet Take 12.5 mg by mouth 2 (two) times daily with a meal.   Yes Historical Provider, MD  docusate sodium (COLACE) 100 MG capsule Take 100 mg by mouth daily as needed. For constipation   Yes Historical Provider, MD  furosemide (LASIX) 40 MG tablet Take 40 mg by mouth 2 (two) times daily.   Yes Historical Provider, MD  insulin glargine (LANTUS) 100 UNIT/ML injection Inject 8 Units into the skin at bedtime.   Yes Historical Provider, MD  sitaGLIPtin (JANUVIA) 25 MG tablet Take 25 mg by mouth daily.   Yes Historical Provider, MD  vitamin B-12 (CYANOCOBALAMIN) 1000 MCG tablet Take 1,000 mcg by mouth daily.   Yes Historical Provider,  MD  warfarin (COUMADIN) 5 MG tablet Take 2.5 mg by mouth daily. 2.5 mg 7 days week   Yes Historical Provider, MD   Current facility-administered medications:acetaminophen (TYLENOL) tablet 500-1,000 mg, 500-1,000 mg, Oral, Q6H PRN, Sorin C Lavera Guise, MD, 1,000 mg at 12/22/11 2149;  ALPRAZolam Prudy Feeler) tablet 0.5 mg, 0.5 mg, Oral, BID PRN, Sorin Luanne Bras, MD, 0.5 mg at 12/22/11 2154;  amiodarone (PACERONE) tablet 200 mg,  200 mg, Oral, Daily, Sorin Luanne Bras, MD;  amitriptyline (ELAVIL) tablet 50 mg, 50 mg, Oral, QHS, Sorin C Laza, MD, 50 mg at 12/22/11 2149 ceFEPIme (MAXIPIME) 1 g in dextrose 5 % 50 mL IVPB, 1 g, Intravenous, Q24H, Hessie Diener [place], PHARMD, 1 g at 12/22/11 2142;  docusate sodium (COLACE) capsule 100 mg, 100 mg, Oral, BID, Sorin C Laza, MD;  furosemide (LASIX) tablet 40 mg, 40 mg, Oral, Daily, Sorin C Laza, MD;  insulin aspart (novoLOG) injection 0-9 Units, 0-9 Units, Subcutaneous, TID WC, Sorin C Laza, MD insulin glargine (LANTUS) injection 5 Units, 5 Units, Subcutaneous, QHS, Sorin C Laza, MD;  ondansetron (ZOFRAN) injection 4 mg, 4 mg, Intravenous, Q6H PRN, Sorin C Lavera Guise, MD;  ondansetron (ZOFRAN) tablet 4 mg, 4 mg, Oral, Q6H PRN, Sorin C Laza, MD;  sodium chloride 0.9 % bolus 500 mL, 500 mL, Intravenous, Once, Celene Kras, MD, 500 mL at 12/22/11 1624;  sodium chloride 0.9 % injection 3 mL, 3 mL, Intravenous, Q12H, Sorin C Laza, MD vitamin B-12 (CYANOCOBALAMIN) tablet 1,000 mcg, 1,000 mcg, Oral, Daily, Sorin C Laza, MD;  DISCONTD: 0.9 %  sodium chloride infusion, 1,000 mL, Intravenous, Continuous, Celene Kras, MD, Last Rate: 125 mL/hr at 12/22/11 1508, 1,000 mL at 12/22/11 1508;  DISCONTD: cefTRIAXone (ROCEPHIN) 1 g in dextrose 5 % 50 mL IVPB, 1 g, Intravenous, Q24H, Celene Kras, MD, 1 g at 12/22/11 1625 DISCONTD: loperamide (IMODIUM) capsule 2 mg, 2 mg, Oral, Once, Quintella Reichert, MD  Allergies: Levaquin and Oxycodone Past Medical History  Diagnosis Date  . Colon cancer   . Hypertension   . Renal insufficiency, mild   . Anxiety   . Diabetes mellitus     diabetic retinopathy  . Anemia   . Aortic stenosis, mild   . Urinary tract infection   . Dysrhythmia     atrial fibrilation  . CHF (congestive heart failure)    Past Surgical History  Procedure Date  . Abdominal surgery   . Abdominal hysterectomy   . Colon surgery   . Appendectomy   . Colostomy   . Cardioversion 09/30/2011    Procedure:  CARDIOVERSION;  Surgeon: Quintella Reichert, MD;  Location: [facility] OR;  Service: Cardiovascular;  Laterality: N/A;  . I&d extremity 11/20/2011    Procedure: IRRIGATION AND DEBRIDEMENT EXTREMITY;  Surgeon: Harvie Junior, MD;  Location: [facility] OR;  Service: Orthopedics;  Laterality: Left;   History reviewed. No pertinent family history. History   Social History  . Marital Status: Widowed    Spouse Name: N/A    Number of Children: N/A  . Years of Education: N/A   Occupational History  . Not on file.   Social History Main Topics  . Smoking status: Never Smoker   . Smokeless tobacco: Current User    Types: Snuff  . Alcohol Use: No  . Drug Use: No  . Sexually Active: No   Other Topics Concern  . Not on file   Social History Narrative  . No narrative on file  Review of Systems: Gen.: Reports fever, chills, anorexia and malaise Head neck and ENT systems: Denies epistaxis, tinnitus or sinus/nasal congestion Respiratory: Denies any cough, sputum production, wheezing. She does admit to easy fatigability on exertion due to shortness of breath. Cardiovascular: Denies any chest pain, palpitations or claudication. Has pedal edema and occasional DOE. GI: Denies vomiting or diarrhea and admits to nausea. Denies hematochezia, melena or hematemesis. GU: See history of present illness above Neurological: Denies any recurrent headaches, focal weakness, numbness or imbalance Musculoskeletal: Chronic arthralgias of the knees, hands and lower back Skin: Denies any rash lumps bumps or altered pigmentation Endocrine: Reports cold intolerance, denies heat intolerance, denies polyuria, polyphagia or polydipsia. Ophthalmic: Denies blurry vision, double vision or photophobia   Physical Exam: Blood pressure 102/63, pulse 85, temperature 98.9 F (37.2 C), temperature source Oral, resp. rate 16, height 5' 2.99" (1.6 m), weight 88.2 kg (194 lb 7.1 oz), SpO2 91.00%. BP 102/63  Pulse 85  Temp 98.9 F (37.2 C)  (Oral)  Resp 16  Ht 5' 2.99" (1.6 m)  Wt 88.2 kg (194 lb 7.1 oz)  BMI 34.45 kg/m2  SpO2 91% General appearance: alert, cooperative, appears stated age, no distress and mildly obese Head: Normocephalic, without obvious abnormality, atraumatic Eyes: PERRL, EOMI Throat: lips, mucosa, and tongue normal; teeth and gums normal Neck: no adenopathy, no carotid bruit, no JVD and supple, symmetrical, trachea midline Back: No CVA tenderness Lungs: diminished breath sounds bilaterally Heart: regular rate and rhythm, S1, S2 normal, no murmur, click, rub or gallop Abdomen: soft, non-tender; bowel sounds normal; no masses,  no organomegaly. Colostomy bag with normal appearing stool. Extremities: edema 2+ bilaterally LE with some skin wrinkling around the ankles. Skin: Skin color, texture, turgor normal. No rashes or lesions Neurologic: Grossly normal  Lab results: Basic Metabolic Panel:  Lab 12/23/11 1610 12/22/11 1434  NA 133* 131*  K 3.9 4.3  CL 97 94*  CO2 21 24  GLUCOSE 83 122*  BUN 55* 52*  CREATININE 4.37* 4.14*  CALCIUM 8.5 8.9  ALB -- --  PHOS -- --   Liver Function Tests:  Lab 12/22/11 1434  AST 24  ALT 10  ALKPHOS 124*  BILITOT 0.5  PROT 6.5  ALBUMIN 2.9*    CBC:  Lab 12/23/11 0650 12/22/11 1434  WBC 14.9* 18.0*  NEUTROABS -- 14.5*  HGB 8.7* 9.1*  HCT 26.0* 28.8*  MCV 88.1 90.0  PLT 104* 112*   Blood Culture    Component Value Date/Time   SDES URINE, CLEAN CATCH 12/05/2011 1908   SPECREQUEST CX ADDED AT 2200 12/05/2011 1908   CULT PSEUDOMONAS AERUGINOSA 12/05/2011 1908   REPTSTATUS 12/08/2011 FINAL 12/05/2011 1908   CBG:  Lab 12/23/11 0743 12/22/11 2019 12/22/11 1855  GLUCAP 96 110* 97   Studies/Results: US Abdomen Complete  12/22/2011  *RADIOLOGY REPORT*  Clinical Data:  Right upper quadrant abdominal pain and fever. History of gallstones.  COMPLETE ABDOMINAL ULTRASOUND  Comparison:  Renal ultrasound dated 12/08/2011 and abdomen and pelvis CT dated  10/14/2009.  Findings:  Gallbladder:  No gallstones, gallbladder wall thickening, or pericholecystic fluid.  Common bile duct:  Normal, measuring 5.5 mm in diameter proximally.  Liver:  No focal lesion identified.  Within normal limits in parenchymal echogenicity.  IVC:  Appears normal.  Pancreas:  No pancreatic tissue visualized at the location of the expected position of the pancreas.  Spleen:  Normal, measuring 11.6 cm in length.  Right Kidney:  Diffuse parenchymal thinning and prominent renal sinus  fat, measuring 11.4 cm in length.  Mildly echogenic.  No hydronephrosis.  Left Kidney:  Diffuse parenchymal thinning and prominent renal sinus fat, measuring 9.2 cm in length.  Mildly echogenic.  As a seen probable mildly complex cysts are not currently visualized. No hydronephrosis.  Abdominal aorta:  Normal in caliber.  Bilateral pleural effusions are noted, greater on the right.  IMPRESSION:  1.  No acute abdominal abnormality. 2.  Probable severe pancreatic atrophy. 3.  Bilateral renal atrophy and evidence of chronic medical renal disease. 4.  Bilateral pleural effusions, greater on the right.   Original Report Authenticated By: Darrol Angel, M.D.     Assessment & Plan by Problem: [name] is a 78 yo female with a PMH of Stage 4 CKD, DM2, A. Fib, HTN, colon cancer, anemia, and chronic diastolic CHF.  1. Acute on Chronic Kidney Injury- Our records show a baseline Cr around 2.0 which has been steadily increasing since July (Cr 2.04 7/5, 2.38 7/19, 2.32 7/26, 2.4 8/28, 2.13 8/29, 3.70 9/13, 3.08 9/17, 3.58 9/19, 4.14 on admission and 4.37 today). It is unlikely that her CKD would progress this quickly without a precipitating factor. Consider cardio work-up for worsening CHF.  - Continue to monitor CMET  - Cardio work-up Based on the history, timeline of events and available data base-suspected that she likely has suffered multiple intercurrent events central to which appears cardiorenal syndrome. It also  appears that she had suffered some acute tubular necrosis from gentamicin exposure in late August and there is need to evaluate and rule out possibility of acute interstitial nephritis from recent quinolone use. Renal ultrasound is not displaying obstruction and shows features consistent with her previously diagnosed chronic kidney disease. We'll check urine electrolytes and rule out the possibility of associated glomerulonephritis but checking ANA, ANCA and complement levels-the latter of which may be useful in also evaluating for possible atheroembolism (low clinical likelihood). I had an extensive discussion with [name] in sensitized her to the possibility of requiring some form of renal replacement therapy in the near future-a decision which I will make following discussion with Dr. Kathrene Bongo. No acute electrolyte concerns noted at this time and medications appear to be appropriately renally dosed. It is plausible that she might have subtle volume depletion in the face of interstitial edema with her hypoalbuminemia however, no intravenous fluids warranted at this time.  2. UTI- UA showed infection, WBC 14.9, cultures pending, on IV cefepime  - will continue to treat per primary team with Pharm input for renal dosing Her frequent urinary tract infections yet again may place her at risk for interstitial renal disease versus drug-induced toxicity. No structural lesions identified from her most recent abdominal ultrasound.   3. DM2- Current BG is 96  - continue per primary team  4. Afib/ CHF- On coumadin and amiodarone to maintain SR.  - continue coumadin per Pharm (last INR 3.93 which is supratheraputic)  5. HTN- Pts current BP is 102/63 and has been hypotensive for much of the admission. She currently off amlodipine/carvedilol.  6. Anemia- Pts baseline is around 11. Is 8.7 today.  - Will continue to monitor CBC Check iron stores and replace if necessary, reevaluate need to start ESA  therapy.  7. Abdominal pain: Predominantly over the right upper quadrant, prior history of cholecystitis versus cholelithiasis at [facility] undergo HIDA scan for further evaluation  This is a Psychologist, occupational Note.  The care of the patient was discussed with Dr. Allena Katz and the  assessment and plan was formulated with their assistance.  Please see their note for official documentation of the patient encounter.   Signed: V. [place], [place], PA-S2 12/23/2011, 10:45 AM   I have personally seen and examined this patient and agree with the assessment/plan as outlined above by [facility] PA student. My additions to the findings and plan above are reflected in the text. Amory Simonetti K.,MD 12/23/2011 12:11 PM

## 2011-12-23 NOTE — Evaluation (Signed)
Physical Therapy Evaluation Patient Details Name: [name] MRN: [id number] DOB: 1925-08-20 Today's Date: 12/23/2011 Time: 4098-1191 PT Time Calculation (min): 12 min  PT Assessment / Plan / Recommendation Clinical Impression  Pt adm with acute on chronic renal failure.  Pt with supportive family and several recent admissions.  Should be able to return home with support of family.    PT Assessment  Patient needs continued PT services    Follow Up Recommendations  Home health PT    Barriers to Discharge        Equipment Recommendations  None recommended by PT    Recommendations for Other Services     Frequency Min 3X/week    Precautions / Restrictions Precautions Precautions: Fall   Pertinent Vitals/Pain N/A      Mobility  Bed Mobility Bed Mobility: Supine to Sit;Sitting - Scoot to Edge of Bed;Sit to Supine Supine to Sit: 4: Min assist;HOB elevated Sitting - Scoot to [place] of Bed: 4: Min assist Sit to Supine: 3: Mod assist Details for Bed Mobility Assistance: assist for trunk Transfers Transfers: Sit to Stand;Stand to Sit Sit to Stand: 4: Min assist;With upper extremity assist;From bed Stand to Sit: 4: Min assist;With upper extremity assist;To bed Details for Transfer Assistance: assist for balance. Ambulation/Gait Ambulation/Gait Assistance: 4: Min assist Ambulation Distance (Feet): 100 Feet Assistive device: 1 person hand held assist Gait Pattern: Wide base of support;Step-through pattern;Decreased stride length    Shoulder Instructions     Exercises     PT Diagnosis: Difficulty walking;Generalized weakness  PT Problem List: Decreased strength;Decreased activity tolerance;Decreased balance;Decreased mobility;Decreased knowledge of use of DME PT Treatment Interventions: DME instruction;Gait training;Functional mobility training;Patient/family education;Therapeutic activities;Therapeutic exercise;Balance training   PT Goals Acute Rehab PT Goals PT Goal  Formulation: With patient Time For Goal Achievement: 12/30/11 Potential to Achieve Goals: Good Pt will go Supine/Side to Sit: with supervision PT Goal: Supine/Side to Sit - Progress: Goal set today Pt will go Sit to Supine/Side: with supervision PT Goal: Sit to Supine/Side - Progress: Goal set today Pt will go Sit to Stand: with supervision PT Goal: Sit to Stand - Progress: Goal set today Pt will go Stand to Sit: with supervision PT Goal: Stand to Sit - Progress: Goal set today Pt will Ambulate: 51 - 150 feet;with supervision;with least restrictive assistive device PT Goal: Ambulate - Progress: Goal set today  Visit Information  Last PT Received On: 12/23/11 Assistance Needed: +1    Subjective Data  Subjective: Pt states she hasn't been using any assistive device at home. Patient Stated Goal: Return home with family.   Prior Functioning  Home Living Lives With: Family Available Help at Discharge: Family;Available 24 hours/day Type of Home: House Home Access: Ramped entrance Home Layout: One level Bathroom Shower/Tub: Engineer, manufacturing systems: Handicapped height Home Adaptive Equipment: Bedside commode/3-in-1;Walker - rolling;Straight cane;Tub transfer bench Additional Comments: Family very supportive. Prior Function Level of Independence: Independent (amb independently without assistive device) Vocation: Retired Musician: No difficulties    Cognition  Overall Cognitive Status: Appears within functional limits for tasks assessed/performed Arousal/Alertness: Awake/alert Orientation Level: Appears intact for tasks assessed Behavior During Session: [facility] for tasks performed    Extremity/Trunk Assessment Right Lower Extremity Assessment RLE ROM/Strength/Tone: Deficits RLE ROM/Strength/Tone Deficits: grossly 4/5 Left Lower Extremity Assessment LLE ROM/Strength/Tone: Deficits LLE ROM/Strength/Tone Deficits: grossly 4/5   Balance    End of Session  PT - End of Session Equipment Utilized During Treatment: Gait belt Activity Tolerance: Patient tolerated treatment well  Patient left: in bed;with call bell/phone within reach;with family/visitor present Nurse Communication: Mobility status  GP     [facility] 12/23/2011, 4:59 PM  [facility] PT [phone]

## 2011-12-23 NOTE — Progress Notes (Signed)
ANTICOAGULATION CONSULT NOTE - Follow Up Consult  Pharmacy Consult for Coumadin Indication: atrial fibrillation  Allergies  Allergen Reactions  . Levaquin (Levofloxacin In D5w) Nausea Only  . Oxycodone Other (See Comments)    Abnormal behavior    Patient Measurements: Height: 5' 2.99" (160 cm) Weight: 194 lb 7.1 oz (88.2 kg) IBW/kg (Calculated) : 52.38    Vital Signs: Temp: 98.9 F (37.2 C) (10/01 0500) BP: 102/63 mmHg (10/01 0657) Pulse Rate: 85  (10/01 0500)  Labs:  Basename 12/23/11 0650 12/22/11 1716 12/22/11 1434  HGB 8.7* -- 9.1*  HCT 26.0* -- 28.8*  PLT 104* -- 112*  APTT -- -- --  LABPROT 36.1* 34.0* --  INR 3.93* 3.62* --  HEPARINUNFRC -- -- --  CREATININE -- -- 4.14*  CKTOTAL -- -- --  CKMB -- -- --  TROPONINI -- -- --    Estimated Creatinine Clearance: 10.8 ml/min (by C-G formula based on Cr of 4.14).   Medications:  Scheduled:    . amiodarone  200 mg Oral Daily  . amitriptyline  50 mg Oral QHS  . ceFEPime (MAXIPIME) IV  1 g Intravenous Q24H  . furosemide  40 mg Oral Daily  . insulin aspart  0-9 Units Subcutaneous TID WC  . insulin glargine  5 Units Subcutaneous QHS  . sodium chloride  500 mL Intravenous Once  . sodium chloride  3 mL Intravenous Q12H  . vitamin B-12  1,000 mcg Oral Daily  . DISCONTD: cefTRIAXone (ROCEPHIN)  IV  1 g Intravenous Q24H  . DISCONTD: loperamide  2 mg Oral Once    Assessment: 78 yr old female on coumadin for afib.  Patient is on coumadin PTA 2.5mg  daily.  INR today is supratherapeutic at 3.93.  Goal of Therapy:  INR 2-3 Monitor platelets by anticoagulation protocol: Yes   Plan:  No Coumadin tonight. Daily PT/INR.  Wendie Simmer, PharmD, BCPS Clinical Pharmacist  Pager: [phone]

## 2011-12-24 DIAGNOSIS — N179 Acute kidney failure, unspecified: Principal | ICD-10-CM

## 2011-12-24 LAB — RENAL FUNCTION PANEL
CO2: 24 mEq/L (ref 19–32)
Calcium: 8.6 mg/dL (ref 8.4–10.5)
Chloride: 100 mEq/L (ref 96–112)
Creatinine, Ser: 4.14 mg/dL — ABNORMAL HIGH (ref 0.50–1.10)
GFR calc non Af Amer: 9 mL/min — ABNORMAL LOW (ref >90)
Glucose, Bld: 146 mg/dL — ABNORMAL HIGH (ref 70–99)

## 2011-12-24 LAB — URINE CULTURE: Colony Count: >=100000

## 2011-12-24 LAB — GLUCOSE, CAPILLARY: Glucose-Capillary: 165 mg/dL — ABNORMAL HIGH (ref 70–99)

## 2011-12-24 LAB — PROTIME-INR
INR: 3.37 — ABNORMAL HIGH (ref 0.00–1.49)
Prothrombin Time: 32.2 seconds — ABNORMAL HIGH (ref 11.6–15.2)

## 2011-12-24 LAB — C3 COMPLEMENT: C3 Complement: 98 mg/dL (ref 90–180)

## 2011-12-24 LAB — ANA: Anti Nuclear Antibody(ANA): NEGATIVE

## 2011-12-24 LAB — UREA NITROGEN, URINE: Urea Nitrogen, Ur: 246 mg/dL

## 2011-12-24 LAB — MPO/PR-3 (ANCA) ANTIBODIES: Myeloperoxidase Abs: <1 AU/mL (ref <20)

## 2011-12-24 MED ORDER — DEXTROSE 5 % IV SOLN: 1.0000 g | INTRAVENOUS | Status: DC

## 2011-12-24 MED ORDER — CEPHALEXIN 250 MG PO CAPS
250.0000 mg | ORAL_CAPSULE | Freq: Two times a day (BID) | ORAL | Status: DC
  Administered 2011-12-25 – 2011-12-26 (×3): 250 mg via ORAL
  Filled 2011-12-24 (×5): qty 1

## 2011-12-24 MED ORDER — SODIUM CHLORIDE 0.9 % IV SOLN
1020.0000 mg | Freq: Once | INTRAVENOUS | Status: AC
  Administered 2011-12-24: 1020 mg via INTRAVENOUS
  Filled 2011-12-24: qty 34

## 2011-12-24 NOTE — Progress Notes (Signed)
Occupational Therapy Evaluation Patient Details Name: [name] MRN: [id number] DOB: 03-29-1925 Today's Date: 12/24/2011 Time: 2952-8413 OT Time Calculation (min): 37 min  OT Assessment / Plan / Recommendation Clinical Impression  78 yo admitted with acute renal failure. PTA, pt lived with her daughter for @ 1 month due to recent admissions/medical problems. Pt plans to go live with daughter again until she is able to return to independent living. Very supportive family. Will benefit from skilled OT services to max independence with ADL and funcitonal mobility for ADL to facilitate safe D/C home with family. Pt will need HHOT at D/C.     OT Assessment  Patient needs continued OT Services    Follow Up Recommendations  Home health OT    Barriers to Discharge None    Equipment Recommendations  None recommended by OT    Recommendations for Other Services    Frequency  Min 2X/week    Precautions / Restrictions Precautions Precautions: Fall   Pertinent Vitals/Pain No c/o pain    ADL  Eating/Feeding: Simulated;Modified independent Where Assessed - Eating/Feeding: Chair Grooming: Simulated;Supervision/safety Where Assessed - Grooming: Supported standing Upper Body Bathing: Simulated;Set up Where Assessed - Upper Body Bathing: Unsupported sitting Lower Body Bathing: Simulated;Minimal assistance Where Assessed - Lower Body Bathing: Supported sit to stand Upper Body Dressing: Simulated;Set up Where Assessed - Upper Body Dressing: Unsupported sitting Lower Body Dressing: Simulated;Minimal assistance Where Assessed - Lower Body Dressing: Supported sit to stand Toilet Transfer: Performed;Min guard Statistician Method: Sit to stand;Stand pivot Acupuncturist: Materials engineer and Hygiene: Performed;Supervision/safety Where Assessed - Engineer, mining and Hygiene: Standing Equipment Used: Gait belt;Rolling  walker Transfers/Ambulation Related to ADLs: Min A RW level ADL Comments: Limited primarily due to fatigue    OT Diagnosis: Generalized weakness  OT Problem List: Decreased strength;Decreased activity tolerance;Decreased safety awareness;Decreased knowledge of use of DME or AE;Obesity OT Treatment Interventions: Self-care/ADL training;Therapeutic exercise;Energy conservation;DME and/or AE instruction;Therapeutic activities;Patient/family education   OT Goals Acute Rehab OT Goals OT Goal Formulation: With patient Time For Goal Achievement: 01/07/12 Potential to Achieve Goals: Good ADL Goals Pt Will Perform Grooming: with supervision;with caregiver independent in assisting;Standing at sink;Unsupported ADL Goal: Grooming - Progress: Goal set today Pt Will Perform Lower Body Bathing: with supervision;with caregiver independent in assisting;Sit to stand from bed;Unsupported;with adaptive equipment ADL Goal: Lower Body Bathing - Progress: Goal set today Pt Will Perform Lower Body Dressing: with supervision;with caregiver independent in assisting;Sit to stand from chair;Unsupported;with adaptive equipment ADL Goal: Lower Body Dressing - Progress: Goal set today Pt Will Transfer to Toilet: with supervision;with caregiver independent in assisting;Ambulation;with DME;3-in-1 ADL Goal: Toilet Transfer - Progress: Goal set today Pt Will Perform Toileting - Clothing Manipulation: with modified independence;Standing ADL Goal: Toileting - Clothing Manipulation - Progress: Goal set today Pt Will Perform Tub/Shower Transfer: Shower transfer;with supervision;with caregiver independent in assisting;Ambulation;with DME ADL Goal: Tub/Shower Transfer - Progress: Goal set today Additional ADL Goal #1: Pt/family will complete bed mobility using log rolling technique to decrease pain with mobility. ADL Goal: Additional Goal #1 - Progress: Goal set today Additional ADL Goal #2: Pt/family will demonstrate back  precautions during ADL  and mobility for ADL to decrase pain - with min vc. ADL Goal: Additional Goal #2 - Progress: Goal set today  Visit Information  Last OT Received On: 12/24/11 Assistance Needed: +1    Subjective Data      Prior Functioning     Home Living Lives With: Family  Available Help at Discharge: Family;Available 24 hours/day Type of Home: House Home Access: Ramped entrance Home Layout: One level Bathroom Shower/Tub: Engineer, manufacturing systems: Handicapped height Bathroom Accessibility: Yes How Accessible: Accessible via walker Home Adaptive Equipment: Bedside commode/3-in-1;Walker - rolling;Straight cane;Tub transfer bench Additional Comments: Family very supportive. Prior Function Level of Independence: Independent (amb independently without assistive device) Able to Take Stairs?: No Driving: No Vocation: Retired Musician: No difficulties Dominant Hand: Right         Vision/Perception     Cognition  Overall Cognitive Status: History of cognitive impairments - at baseline Arousal/Alertness: Awake/alert Orientation Level: Appears intact for tasks assessed Behavior During Session: [facility] for tasks performed Cognition - Other Comments: Family reports min STM deifcits.    Extremity/Trunk Assessment Right Upper Extremity Assessment RUE ROM/Strength/Tone: WFL for tasks assessed Left Upper Extremity Assessment LUE ROM/Strength/Tone: WFL for tasks assessed     Mobility Bed Mobility Bed Mobility: Supine to Sit;Rolling Right;Right Sidelying to Sit;Sitting - Scoot to Edge of Bed Rolling Right: 5: Supervision;With rail Right Sidelying to Sit: 4: Min assist;HOB flat;With rails Supine to Sit: 4: Min assist;HOB flat;With rails Sitting - Scoot to Edge of Bed: 5: Supervision Details for Bed Mobility Assistance: taught pt log rolling techniques deu to hx of back problems and back pain Transfers Transfers: Sit to Stand;Stand to Sit Sit to  Stand: 4: Min assist;With upper extremity assist;From bed Stand to Sit: 4: Min assist;With upper extremity assist;To chair/3-in-1 Details for Transfer Assistance: vc for safe hand placement wand RW use     Shoulder Instructions     Exercise     Balance  Min A   End of Session OT - End of Session Equipment Utilized During Treatment: Gait belt Activity Tolerance: Patient tolerated treatment well Patient left: in chair;with call bell/phone within reach;with family/visitor present Nurse Communication: Mobility status  GO     Dalvin Clipper,HILLARY 12/24/2011, 11:03 AM Luisa Dago, OTR/L  [phone] 12/24/2011

## 2011-12-24 NOTE — Progress Notes (Signed)
[facility] PROGRESS NOTE  [name] XBJ:[id number] DOB: [birth year] DOA: 12/22/2011 PCP: Lenora Boys, MD  Assessment/Plan: Principal Problem:  *Renal failure (ARF), acute on chronic Active Problems:  Anemia  Diabetes mellitus type II  Hypertension  UTI (lower urinary tract infection)  A-fib  CKD (chronic kidney disease) stage 4, GFR 15-29 ml/min  RUQ abdominal pain  Acute on chronic diastolic CHF (congestive heart failure), NYHA class 2  Thrombocytopenia  Colon cancer  1. Recurrent UTI - last Cx pseudomonas but current one shows Escherichia coli - seems to do better - started on iv Cefepime from admission 12/23/11- await repeat culture. Improved after 24 hrs. We'll narrow to oral Keflex. 2. Acute on chronic renal failure with volume overload and hypotension- unclear what is the precipitating factor - consulted nephrology Dr. Allena Katz on 12/23/11. Nephrology following. Creatinine slightly better than yesterday. 3. RUQ abd pain reported on admission - improved after iv abx. Abd [place] without gallstones, but with distended GB - HIDA scan negative. Resolved 4. Atrial fibrillation status post direct-current cardioversion. Controlled ventricular rate. Continue amiodarone Continue full dose Coumadin per pharmacy  5. Anemia - chronic due to CKD and chronic ds.  6. Remote hx of colon cancer with chronic colostomy since 1992 - has large asymptomatic parastomal hernia.  7. DM 2 - as renal function is worse home lantus dose has been decreased. Cont ssi and lantus 5 QHS 8. Thrombocytopenia: Follow CBCs tomorrow.   Code Status: full Family Communication: daughter Ms. Sarah at bedside, updated care and answered questions. Disposition Plan: home   Brief narrative: [name] is a 78 y.o. female with history of chronic disease, atrial fibrillation, congestive heart failure, chronic Coumadin who was recently discharged from [facility] after being admitted for acute on chronic renal  failure and Pseudomonas urinary tract infection was brought back by family for persistent right upper quadrant abdominal pain, nausea, decreased appetite, increased weakness. In the emergency room the patient was found to have worsening renal failure hypotension leukocytosis and was referred for admission.   Consultants:  [facility]  Procedures:  Abdominal US  Antibiotics:  Cefepime 9/30-10/2  Rocephin 10/2  HPI/Subjective: Denies complaints.  Objective: Filed Vitals:   12/23/11 1600 12/23/11 2112 12/24/11 0500 12/24/11 1335  BP: 135/77 97/60 112/68 146/77  Pulse: 101 95 93 87  Temp: 98.9 F (37.2 C) 98.5 F (36.9 C) 98.2 F (36.8 C) 98.6 F (37 C)  TempSrc:    Oral  Resp: 18 18 18 20   Height:      Weight:      SpO2: 91% 92% 90% 93%    Intake/Output Summary (Last 24 hours) at 12/24/11 1810 Last data filed at 12/24/11 1300  Gross per 24 hour  Intake    603 ml  Output    750 ml  Net   -147 ml   Filed Weights   12/22/11 1537  Weight: 88.2 kg (194 lb 7.1 oz)    Exam:   General:  Comfortable.  Cardiovascular: RRR, no M,R,G. Telemetry shows A. fib in the 80s to 90s.  Respiratory: CTAB. No increased work of breathing.   Abdomen: soft, nontender and normal bowel sounds heard. Colostomy left lower quadrant with soft stools.  Central nervous system: Alert and oriented. No focal neurological deficits.  Data Reviewed: Basic Metabolic Panel:  Lab 12/24/11 3086 12/23/11 0650 12/22/11 1434  NA 136 133* 131*  K 4.0 3.9 4.3  CL 100 97 94*  CO2 24 21 24  GLUCOSE 146* 83 122*  BUN 54* 55* 52*  CREATININE 4.14* 4.37* 4.14*  CALCIUM 8.6 8.5 8.9  MG -- -- --  PHOS 4.1 -- --   Liver Function Tests:  Lab 12/24/11 1010 12/22/11 1434  AST -- 24  ALT -- 10  ALKPHOS -- 124*  BILITOT -- 0.5  PROT -- 6.5  ALBUMIN 2.5* 2.9*   No results found for this basename: LIPASE:5,AMYLASE:5 in the last 168 hours No results found for this basename: AMMONIA:5 in the  last 168 hours CBC:  Lab 12/23/11 0650 12/22/11 1434  WBC 14.9* 18.0*  NEUTROABS -- 14.5*  HGB 8.7* 9.1*  HCT 26.0* 28.8*  MCV 88.1 90.0  PLT 104* 112*   Cardiac Enzymes: No results found for this basename: CKTOTAL:5,CKMB:5,CKMBINDEX:5,TROPONINI:5 in the last 168 hours BNP (last 3 results)  Basename 12/05/11 2205 11/20/11 1544 08/27/11 1931  PROBNP 4125.0* 4130.0* 4980.0*   CBG:  Lab 12/24/11 1638 12/24/11 1135 12/24/11 0735 12/23/11 2133 12/23/11 1634  GLUCAP 165* 117* 83 138* 195*    Recent Results (from the past 240 hour(s))  URINE CULTURE     Status: Normal   Collection Time   12/22/11  2:50 PM      Component Value Range Status Comment   Specimen Description URINE, CATHETERIZED   Final    Special Requests NONE   Final    Culture  Setup Time 12/22/2011 16:05   Final    Colony Count >=100,000 COLONIES/ML   Final    Culture ESCHERICHIA COLI   Final    Report Status 12/24/2011 FINAL   Final    Organism ID, Bacteria ESCHERICHIA COLI   Final      Studies: US Abdomen Complete  12/22/2011  *RADIOLOGY REPORT*  Clinical Data:  Right upper quadrant abdominal pain and fever. History of gallstones.  COMPLETE ABDOMINAL ULTRASOUND  Comparison:  Renal ultrasound dated 12/08/2011 and abdomen and pelvis CT dated 10/14/2009.  Findings:  Gallbladder:  No gallstones, gallbladder wall thickening, or pericholecystic fluid.  Common bile duct:  Normal, measuring 5.5 mm in diameter proximally.  Liver:  No focal lesion identified.  Within normal limits in parenchymal echogenicity.  IVC:  Appears normal.  Pancreas:  No pancreatic tissue visualized at the location of the expected position of the pancreas.  Spleen:  Normal, measuring 11.6 cm in length.  Right Kidney:  Diffuse parenchymal thinning and prominent renal sinus fat, measuring 11.4 cm in length.  Mildly echogenic.  No hydronephrosis.  Left Kidney:  Diffuse parenchymal thinning and prominent renal sinus fat, measuring 9.2 cm in length.  Mildly  echogenic.  As a seen probable mildly complex cysts are not currently visualized. No hydronephrosis.  Abdominal aorta:  Normal in caliber.  Bilateral pleural effusions are noted, greater on the right.  IMPRESSION:  1.  No acute abdominal abnormality. 2.  Probable severe pancreatic atrophy. 3.  Bilateral renal atrophy and evidence of chronic medical renal disease. 4.  Bilateral pleural effusions, greater on the right.   Original Report Authenticated By: Darrol Angel, M.D.    Nm Hepato W/eject Fract  12/23/2011  *RADIOLOGY REPORT*  Clinical Data:  Right upper quadrant abdominal pain.  NUCLEAR MEDICINE HEPATOBILIARY IMAGING WITH GALLBLADDER EF  Technique:  Sequential images of the abdomen were obtained out to 60 minutes following intravenous administration of radiopharmaceutical.  After slow intravenous infusion of 1.8 micrograms Cholecystokinin, gallbladder ejection fraction was determined.  Radiopharmaceutical:  5.0 mCi Tc-35m Choletec  Comparison:  Ultrasound 12/22/2011.  Findings: There  is symmetric uptake in the liver and prompt excretion into the biliary tree which is visualized by 15 minutes. The gallbladder is visualized at .  Activity is noted in the small bowel at 25 minutes.  The patient received a protocoled infusion of CCK and a gallbladder ejection fraction was estimated at 37%.  Normal is greater than 30%.  The patient did not experienced symptoms during CCK infusion.  IMPRESSION:  Normal biliary patency study and low normal gallbladder ejection fraction.   Original Report Authenticated By: P. Loralie Champagne, M.D.     Scheduled Meds:    . amiodarone  200 mg Oral Daily  . amitriptyline  50 mg Oral QHS  . ceFEPime (MAXIPIME) IV  1 g Intravenous Q24H  . docusate sodium  100 mg Oral BID  . ferumoxytol  1,020 mg Intravenous Once  . insulin aspart  0-9 Units Subcutaneous TID WC  . insulin glargine  5 Units Subcutaneous QHS  . sodium chloride  3 mL Intravenous Q12H  . vitamin B-12   1,000 mcg Oral Daily   Continuous Infusions:    [facility]  [facility] Pager [phone]. If 8PM-8AM, please contact night-coverage at [email or web address], password [facility] 12/24/2011, 6:10 PM  LOS: 2 days

## 2011-12-24 NOTE — Progress Notes (Signed)
ANTICOAGULATION CONSULT NOTE - Follow Up Consult  Pharmacy Consult for Coumadin Indication: atrial fibrillation  Allergies  Allergen Reactions  . Levaquin (Levofloxacin In D5w) Nausea Only  . Oxycodone Other (See Comments)    Abnormal behavior    Patient Measurements: Height: 5' 2.99" (160 cm) Weight: 194 lb 7.1 oz (88.2 kg) IBW/kg (Calculated) : 52.38    Vital Signs: Temp: 98.2 F (36.8 C) (10/02 0500) BP: 112/68 mmHg (10/02 0500) Pulse Rate: 93  (10/02 0500)  Labs:  Basename 12/24/11 0537 12/23/11 0650 12/22/11 1716 12/22/11 1434  HGB -- 8.7* -- 9.1*  HCT -- 26.0* -- 28.8*  PLT -- 104* -- 112*  APTT -- -- -- --  LABPROT 32.2* 36.1* 34.0* --  INR 3.37* 3.93* 3.62* --  HEPARINUNFRC -- -- -- --  CREATININE -- 4.37* -- 4.14*  CKTOTAL -- -- -- --  CKMB -- -- -- --  TROPONINI -- -- -- --    Estimated Creatinine Clearance: 10.3 ml/min (by C-G formula based on Cr of 4.37).   Medications:  Scheduled:     . amiodarone  200 mg Oral Daily  . amitriptyline  50 mg Oral QHS  . ceFEPime (MAXIPIME) IV  1 g Intravenous Q24H  . docusate sodium  100 mg Oral BID  . ferumoxytol  1,020 mg Intravenous Once  . insulin aspart  0-9 Units Subcutaneous TID WC  . insulin glargine  5 Units Subcutaneous QHS  . sodium chloride  3 mL Intravenous Q12H  . vitamin B-12  1,000 mcg Oral Daily  . DISCONTD: furosemide  40 mg Oral Daily    Assessment: 78 yr old female on coumadin for afib.  Patient is on coumadin PTA 2.5mg  daily. INR today is supra-therapeutic at 3.37 (trending back down). No bleeding noted.   Goal of Therapy:  INR 2-3 Monitor platelets by anticoagulation protocol: Yes   Plan:  No Coumadin tonight. Daily PT/INR.  Link Snuffer, PharmD, BCPS Clinical Pharmacist [phone] 12/24/2011, 10:09 AM

## 2011-12-24 NOTE — Progress Notes (Signed)
Patient ID: [name], female   DOB: 10/09/1925, 78 y.o.   MRN: [id number]   [facility] Progress Note    Subjective:   Reports to be feeling better, dysuria has dramatically improved. She denies any fevers, chills and states that abdominal pain is better.    Objective:   BP 112/68  Pulse 93  Temp 98.2 F (36.8 C) (Oral)  Resp 18  Ht 5' 2.99" (1.6 m)  Wt 88.2 kg (194 lb 7.1 oz)  BMI 34.45 kg/m2  SpO2 90%  Intake/Output Summary (Last 24 hours) at 12/24/11 0836 Last data filed at 12/23/11 1200  Gross per 24 hour  Intake      0 ml  Output    400 ml  Net   -400 ml   Weight change:   Physical Exam: Gen: Comfortably resting in bed, empty breakfast tray and daughter by bedside CVS: Pulse regular in rate and rhythm, heart sounds S1 and S2 are normal Resp: Diminished breath sounds over bases otherwise clear to auscultation bilaterally Abd: Soft, obese, mild tender over the right upper quadrant otherwise without any organomegaly/rebound Ext: One plus edema over the ankle/lower legs  Imaging: US Abdomen Complete  12/22/2011  *RADIOLOGY REPORT*  Clinical Data:  Right upper quadrant abdominal pain and fever. History of gallstones.  COMPLETE ABDOMINAL ULTRASOUND  Comparison:  Renal ultrasound dated 12/08/2011 and abdomen and pelvis CT dated 10/14/2009.  Findings:  Gallbladder:  No gallstones, gallbladder wall thickening, or pericholecystic fluid.  Common bile duct:  Normal, measuring 5.5 mm in diameter proximally.  Liver:  No focal lesion identified.  Within normal limits in parenchymal echogenicity.  IVC:  Appears normal.  Pancreas:  No pancreatic tissue visualized at the location of the expected position of the pancreas.  Spleen:  Normal, measuring 11.6 cm in length.  Right Kidney:  Diffuse parenchymal thinning and prominent renal sinus fat, measuring 11.4 cm in length.  Mildly echogenic.  No hydronephrosis.  Left Kidney:  Diffuse parenchymal thinning and prominent renal  sinus fat, measuring 9.2 cm in length.  Mildly echogenic.  As a seen probable mildly complex cysts are not currently visualized. No hydronephrosis.  Abdominal aorta:  Normal in caliber.  Bilateral pleural effusions are noted, greater on the right.  IMPRESSION:  1.  No acute abdominal abnormality. 2.  Probable severe pancreatic atrophy. 3.  Bilateral renal atrophy and evidence of chronic medical renal disease. 4.  Bilateral pleural effusions, greater on the right.   Original Report Authenticated By: Darrol Angel, M.D.    Nm Hepato W/eject Fract  12/23/2011  *RADIOLOGY REPORT*  Clinical Data:  Right upper quadrant abdominal pain.  NUCLEAR MEDICINE HEPATOBILIARY IMAGING WITH GALLBLADDER EF  Technique:  Sequential images of the abdomen were obtained out to 60 minutes following intravenous administration of radiopharmaceutical.  After slow intravenous infusion of 1.8 micrograms Cholecystokinin, gallbladder ejection fraction was determined.  Radiopharmaceutical:  5.0 mCi Tc-14m Choletec  Comparison:  Ultrasound 12/22/2011.  Findings: There is symmetric uptake in the liver and prompt excretion into the biliary tree which is visualized by 15 minutes. The gallbladder is visualized at .  Activity is noted in the small bowel at 25 minutes.  The patient received a protocoled infusion of CCK and a gallbladder ejection fraction was estimated at 37%.  Normal is greater than 30%.  The patient did not experienced symptoms during CCK infusion.  IMPRESSION:  Normal biliary patency study and low normal gallbladder ejection fraction.   Original Report Authenticated By:  Cyndie Chime, M.D.     Labs: BMET  Lab 12/23/11 0650 12/22/11 1434  NA 133* 131*  K 3.9 4.3  CL 97 94*  CO2 21 24  GLUCOSE 83 122*  BUN 55* 52*  CREATININE 4.37* 4.14*  ALB -- --  CALCIUM 8.5 8.9  PHOS -- --   CBC  Lab 12/23/11 0650 12/22/11 1434  WBC 14.9* 18.0*  NEUTROABS -- 14.5*  HGB 8.7* 9.1*  HCT 26.0* 28.8*  MCV 88.1 90.0   PLT 104* 112*    Medications:      . amiodarone  200 mg Oral Daily  . amitriptyline  50 mg Oral QHS  . ceFEPime (MAXIPIME) IV  1 g Intravenous Q24H  . docusate sodium  100 mg Oral BID  . ferumoxytol  1,020 mg Intravenous Once  . insulin aspart  0-9 Units Subcutaneous TID WC  . insulin glargine  5 Units Subcutaneous QHS  . sodium chloride  3 mL Intravenous Q12H  . vitamin B-12  1,000 mcg Oral Daily  . DISCONTD: furosemide  40 mg Oral Daily     Assessment/ Plan:   1. Acute renal failure chronic kidney disease stage III: Suspected to be multifactorial central to which appears to be cardiorenal syndrome and recent exposure to intravenous gentamicin. Evaluating for possible glomerulonephritis and ruling out the possibility of acute interstitial nephritis. Renal imaging was significant for no hydronephrosis/obstructive pathology. Await labs from this morning to reassess need for intervention. She does not have any manifest uremic symptoms at this time. Urine output appears to be marginal-she remains off diuretic therapy at this time. 2. Urinary tract infection with what appears to be early SIRS: Currently on intravenous cefepime-await sensitivities, preliminary urine cultures revealed Escherichia coli  3. Abdominal pain: Clinically better, HIDA scan negative for any obstruction/reductions in GB ejection fraction.  4. Anemia of chronic kidney disease: Iron stores depleted, we'll give intravenous iron and plan to restart Aranesp tomorrow. 5. Diabetes mellitus: Satisfactory glycemic control with ongoing Lantus/3 times a day a.c. NovoLog sliding scale.   Zetta Bills, MD 12/24/2011, 8:36 AM

## 2011-12-24 NOTE — Consult Note (Signed)
WOC ostomy consult  Stoma type/location: LLQ, end stoma. Pt has had colostomy x 30 years and is independent in the care of this.  Consult was placed to make sure that pt has appropriate supplies while in hospital.  Stomal assessment/size: 1 3/4" round Output liquid brown Ostomy pouching: 1pc karaya in place, supplies ordered for her to use. She reports she changes 2-3 x per week.  Notified bedside nurse that I have ordered  1 3/4" karaya pouches for pt. Hart [place] [phone] (3 ordered)    Re consult if needed, will not follow at this time. Thanks  Jovanka [place], CWOCN [phone])

## 2011-12-25 LAB — CBC
HCT: 28.9 % — ABNORMAL LOW (ref 36.0–46.0)
Hemoglobin: 9.4 g/dL — ABNORMAL LOW (ref 12.0–15.0)
MCH: 28.8 pg (ref 26.0–34.0)
MCHC: 32.5 g/dL (ref 30.0–36.0)
MCV: 88.7 fL (ref 78.0–100.0)
Platelets: 132 10*3/uL — ABNORMAL LOW (ref 150–400)
RBC: 3.26 MIL/uL — ABNORMAL LOW (ref 3.87–5.11)
RDW: 14.4 % (ref 11.5–15.5)
WBC: 6.9 10*3/uL (ref 4.0–10.5)

## 2011-12-25 LAB — RENAL FUNCTION PANEL
Albumin: 2.5 g/dL — ABNORMAL LOW (ref 3.5–5.2)
BUN: 45 mg/dL — ABNORMAL HIGH (ref 6–23)
CO2: 22 mEq/L (ref 19–32)
Calcium: 9 mg/dL (ref 8.4–10.5)
Chloride: 103 mEq/L (ref 96–112)
Creatinine, Ser: 3.66 mg/dL — ABNORMAL HIGH (ref 0.50–1.10)
GFR calc Af Amer: 12 mL/min — ABNORMAL LOW (ref >90)
GFR calc non Af Amer: 11 mL/min — ABNORMAL LOW (ref >90)
Glucose, Bld: 87 mg/dL (ref 70–99)
Phosphorus: 4 mg/dL (ref 2.3–4.6)
Potassium: 3.9 mEq/L (ref 3.5–5.1)
Sodium: 137 mEq/L (ref 135–145)

## 2011-12-25 LAB — PROTIME-INR: INR: 2.01 — ABNORMAL HIGH (ref 0.00–1.49)

## 2011-12-25 LAB — GLUCOSE, CAPILLARY: Glucose-Capillary: 160 mg/dL — ABNORMAL HIGH (ref 70–99)

## 2011-12-25 MED ORDER — WARFARIN SODIUM 2.5 MG PO TABS
2.5000 mg | ORAL_TABLET | Freq: Once | ORAL | Status: AC
  Administered 2011-12-25: 2.5 mg via ORAL
  Filled 2011-12-25: qty 1

## 2011-12-25 MED ORDER — WARFARIN - PHARMACIST DOSING INPATIENT: Freq: Every day | Status: DC

## 2011-12-25 MED ORDER — FUROSEMIDE 40 MG PO TABS
40.0000 mg | ORAL_TABLET | Freq: Every day | ORAL | Status: DC
  Administered 2011-12-25 – 2011-12-26 (×2): 40 mg via ORAL
  Filled 2011-12-25 (×2): qty 1

## 2011-12-25 MED ORDER — DARBEPOETIN ALFA-POLYSORBATE 100 MCG/0.5ML IJ SOLN
100.0000 ug | INTRAMUSCULAR | Status: DC
  Administered 2011-12-25: 100 ug via SUBCUTANEOUS
  Filled 2011-12-25: qty 0.5

## 2011-12-25 NOTE — Progress Notes (Signed)
Physical Therapy Treatment Patient Details Name: [name] MRN: [id number] DOB: 1926-01-22 Today's Date: 12/25/2011 Time: 5621-3086 PT Time Calculation (min): 16 min  PT Assessment / Plan / Recommendation Comments on Treatment Session  Pt admitted with acute on chronic renal failure and continues to progress with therapy. Pt motivated and able to increase ambulation distance/independence today.     Follow Up Recommendations  Home health PT    Barriers to Discharge        Equipment Recommendations  None recommended by PT    Recommendations for Other Services    Frequency Min 3X/week   Plan Discharge plan remains appropriate;Frequency remains appropriate    Precautions / Restrictions Precautions Precautions: Fall Restrictions Weight Bearing Restrictions: No   Pertinent Vitals/Pain None    Mobility  Bed Mobility Bed Mobility: Not assessed Transfers Transfers: Sit to Stand;Stand to Sit (2 trials.) Sit to Stand: 4: Min guard;With upper extremity assist;From chair/3-in-1;From toilet Stand to Sit: 4: Min guard;With upper extremity assist;To toilet;To chair/3-in-1 Details for Transfer Assistance: Guarding for balance with cues for safest hand placement. Ambulation/Gait Ambulation/Gait Assistance: 4: Min guard Ambulation Distance (Feet): 180 Feet Assistive device: Rolling walker Ambulation/Gait Assistance Details: Guarding for balance with cues to extend trunk, step into RW, and safety to avoid obstacles. Gait Pattern: Step-through pattern;Decreased stride length;Trunk flexed;Wide base of support Stairs: No Wheelchair Mobility Wheelchair Mobility: No    Exercises     PT Diagnosis:    PT Problem List:   PT Treatment Interventions:     PT Goals Acute Rehab PT Goals PT Goal Formulation: With patient Time For Goal Achievement: 12/30/11 Potential to Achieve Goals: Good PT Goal: Sit to Stand - Progress: Progressing toward goal PT Goal: Stand to Sit - Progress:  Progressing toward goal PT Goal: Ambulate - Progress: Progressing toward goal  Visit Information  Last PT Received On: 12/25/11 Assistance Needed: +1    Subjective Data  Subjective: "I can definitely walk!" Patient Stated Goal: Return home with family.   Cognition  Overall Cognitive Status: History of cognitive impairments - at baseline Arousal/Alertness: Awake/alert Orientation Level: Appears intact for tasks assessed Behavior During Session: [facility] for tasks performed Cognition - Other Comments: Family reports min STM deifcits.    Balance  Balance Balance Assessed: No  End of Session PT - End of Session Equipment Utilized During Treatment: Gait belt Activity Tolerance: Patient tolerated treatment well Patient left: in chair;with call bell/phone within reach;with family/visitor present Nurse Communication: Mobility status   GP     Cephus Shelling 12/25/2011, 11:47 AM  12/25/2011 Cephus Shelling, PT, DPT [phone]

## 2011-12-25 NOTE — Progress Notes (Signed)
ANTICOAGULATION CONSULT NOTE - Follow Up Consult  Pharmacy Consult for Coumadin Indication: atrial fibrillation  Allergies  Allergen Reactions  . Levaquin (Levofloxacin In D5w) Nausea Only  . Oxycodone Other (See Comments)    Abnormal behavior    Patient Measurements: Height: 5' 2.99" (160 cm) Weight: 194 lb 7.1 oz (88.2 kg) IBW/kg (Calculated) : 52.38    Vital Signs: Temp: 98.7 F (37.1 C) (10/03 0600) Temp src: Oral (10/03 0600) BP: 129/83 mmHg (10/03 0600) Pulse Rate: 110  (10/03 0600)  Labs:  Basename 12/25/11 0531 12/24/11 1010 12/24/11 0537 12/23/11 0650 12/22/11 1434  HGB 9.4* -- -- 8.7* --  HCT 28.9* -- -- 26.0* 28.8*  PLT 132* -- -- 104* 112*  APTT -- -- -- -- --  LABPROT 22.0* -- 32.2* 36.1* --  INR 2.01* -- 3.37* 3.93* --  HEPARINUNFRC -- -- -- -- --  CREATININE 3.66* 4.14* -- 4.37* --  CKTOTAL -- -- -- -- --  CKMB -- -- -- -- --  TROPONINI -- -- -- -- --    Estimated Creatinine Clearance: 12.3 ml/min (by C-G formula based on Cr of 3.66).   Medications:  Scheduled:     . amiodarone  200 mg Oral Daily  . amitriptyline  50 mg Oral QHS  . cephALEXin  250 mg Oral Q12H  . darbepoetin (ARANESP) injection - NON-DIALYSIS  100 mcg Subcutaneous Q Thu-1800  . docusate sodium  100 mg Oral BID  . furosemide  40 mg Oral Daily  . insulin aspart  0-9 Units Subcutaneous TID WC  . insulin glargine  5 Units Subcutaneous QHS  . sodium chloride  3 mL Intravenous Q12H  . vitamin B-12  1,000 mcg Oral Daily  . DISCONTD: ceFEPime (MAXIPIME) IV  1 g Intravenous Q24H  . DISCONTD: cefTRIAXone (ROCEPHIN)  IV  1 g Intravenous Q24H    Assessment: [name] on coumadin for afib.  Patient is on coumadin PTA 2.5mg  daily. INR today is now therapeutic at 2.01 (dramatic drop from yesterday). No bleeding noted. CBC improved this am.   Goal of Therapy:  INR 2-3   Plan:  Coumadin 2.5mg  po x1 today.  Daily PT/INR.  [name], PharmD, BCPS Clinical  Pharmacist [phone] 12/25/2011, 12:17 PM

## 2011-12-25 NOTE — Progress Notes (Signed)
[facility] PROGRESS NOTE  [name] ZOX:[id number] DOB: Dec 29, 1925 DOA: 12/22/2011 PCP: Lenora Boys, MD  Assessment/Plan: Principal Problem:  *Renal failure (ARF), acute on chronic Active Problems:  Anemia  Diabetes mellitus type II  Hypertension  UTI (lower urinary tract infection)  A-fib  CKD (chronic kidney disease) stage 4, GFR 15-29 ml/min  RUQ abdominal pain  Acute on chronic diastolic CHF (congestive heart failure), NYHA class 2  Thrombocytopenia  Colon cancer  1. Recurrent UTI - last Cx pseudomonas but current one shows Escherichia coli - seems to do better - started on iv Cefepime from admission 12/23/11- await repeat culture. Improved after 24 hrs. We'll narrow to oral Keflex. 2. Acute on chronic renal failure with volume overload and hypotension- unclear what is the precipitating factor - consulted nephrology Dr. Allena Katz on 12/23/11. Nephrology following. Creatinine improving. 3. RUQ abd pain reported on admission - Abd [place] without gallstones, but with distended GB - HIDA scan negative. Resolved 4. Atrial fibrillation status post direct-current cardioversion. Controlled ventricular rate. Continue amiodarone Continue full dose Coumadin per pharmacy. INR 2.01  5. Anemia - chronic due to CKD and chronic ds.  6. Remote hx of colon cancer with chronic colostomy since 1992 - has large asymptomatic parastomal hernia.  7. DM 2 - as renal function is worse home lantus dose has been decreased. Cont ssi and lantus 5 QHS 8. Thrombocytopenia: Follow CBCs tomorrow. Improving.   Code Status: full Family Communication: daughter Ms. Sarah at bedside on 10/2, updated care and answered questions. Disposition Plan: home? 10/4   Brief narrative: [name] is a 78 y.o. female with history of chronic disease, atrial fibrillation, congestive heart failure, chronic Coumadin who was recently discharged from [facility] after being admitted for acute on chronic renal failure  and Pseudomonas urinary tract infection was brought back by family for persistent right upper quadrant abdominal pain, nausea, decreased appetite, increased weakness. In the emergency room the patient was found to have worsening renal failure hypotension leukocytosis and was referred for admission.   Consultants:  [facility]  Procedures:  Abdominal US  Antibiotics:  Cefepime 9/30-10/2  Keflex 10/2  HPI/Subjective: Denies complaints. Dysuria has improved.  Objective: Filed Vitals:   12/24/11 1335 12/24/11 2100 12/25/11 0600 12/25/11 1430  BP: 146/77 142/79 129/83 120/69  Pulse: 87 91 110 93  Temp: 98.6 F (37 C) 98.6 F (37 C) 98.7 F (37.1 C) 98.5 F (36.9 C)  TempSrc: Oral Oral Oral Oral  Resp: 20 20 20 16   Height:      Weight:      SpO2: 93% 91% 98% 99%    Intake/Output Summary (Last 24 hours) at 12/25/11 1834 Last data filed at 12/25/11 1300  Gross per 24 hour  Intake    600 ml  Output    550 ml  Net     50 ml   Filed Weights   12/22/11 1537  Weight: 88.2 kg (194 lb 7.1 oz)    Exam:   General:  Comfortable.  Cardiovascular: RRR, no M,R,G. Telemetry shows A. fib in the 80s to 90s.  Respiratory: CTAB. No increased work of breathing.   Abdomen: soft, nontender and normal bowel sounds heard. Colostomy left lower quadrant with soft stools.  Central nervous system: Alert and oriented. No focal neurological deficits.  Data Reviewed: Basic Metabolic Panel:  Lab 12/25/11 8119 12/24/11 1010 12/23/11 0650 12/22/11 1434  NA 137 136 133* 131*  K 3.9 4.0 3.9 4.3  CL  103 100 97 94*  CO2 22 24 21 24   GLUCOSE 87 146* 83 122*  BUN 45* 54* 55* 52*  CREATININE 3.66* 4.14* 4.37* 4.14*  CALCIUM 9.0 8.6 8.5 8.9  MG -- -- -- --  PHOS 4.0 4.1 -- --   Liver Function Tests:  Lab 12/25/11 0531 12/24/11 1010 12/22/11 1434  AST -- -- 24  ALT -- -- 10  ALKPHOS -- -- 124*  BILITOT -- -- 0.5  PROT -- -- 6.5  ALBUMIN 2.5* 2.5* 2.9*   No results found for  this basename: LIPASE:5,AMYLASE:5 in the last 168 hours No results found for this basename: AMMONIA:5 in the last 168 hours CBC:  Lab 12/25/11 0531 12/23/11 0650 12/22/11 1434  WBC 6.9 14.9* 18.0*  NEUTROABS -- -- 14.5*  HGB 9.4* 8.7* 9.1*  HCT 28.9* 26.0* 28.8*  MCV 88.7 88.1 90.0  PLT 132* 104* 112*   Cardiac Enzymes: No results found for this basename: CKTOTAL:5,CKMB:5,CKMBINDEX:5,TROPONINI:5 in the last 168 hours BNP (last 3 results)  Basename 12/05/11 2205 11/20/11 1544 08/27/11 1931  PROBNP 4125.0* 4130.0* 4980.0*   CBG:  Lab 12/25/11 1634 12/25/11 1128 12/25/11 0746 12/24/11 2137 12/24/11 1638  GLUCAP 160* 120* 90 94 165*    Recent Results (from the past 240 hour(s))  URINE CULTURE     Status: Normal   Collection Time   12/22/11  2:50 PM      Component Value Range Status Comment   Specimen Description URINE, CATHETERIZED   Final    Special Requests NONE   Final    Culture  Setup Time 12/22/2011 16:05   Final    Colony Count >=100,000 COLONIES/ML   Final    Culture ESCHERICHIA COLI   Final    Report Status 12/24/2011 FINAL   Final    Organism ID, Bacteria ESCHERICHIA COLI   Final      Studies: No results found.  Scheduled Meds:    . amiodarone  200 mg Oral Daily  . amitriptyline  50 mg Oral QHS  . cephALEXin  250 mg Oral Q12H  . darbepoetin (ARANESP) injection - NON-DIALYSIS  100 mcg Subcutaneous Q Thu-1800  . docusate sodium  100 mg Oral BID  . furosemide  40 mg Oral Daily  . insulin aspart  0-9 Units Subcutaneous TID WC  . insulin glargine  5 Units Subcutaneous QHS  . sodium chloride  3 mL Intravenous Q12H  . vitamin B-12  1,000 mcg Oral Daily  . warfarin  2.5 mg Oral ONCE-1800  . Warfarin - Pharmacist Dosing Inpatient   Does not apply q1800   Continuous Infusions:    [facility]  [facility] Pager [phone]. If 8PM-8AM, please contact night-coverage at [email or web address], password [facility] 12/25/2011, 6:34 PM  LOS: 3 days

## 2011-12-25 NOTE — Progress Notes (Addendum)
Patient ID: BRADLIE MAGRUDER, female   DOB: [birth year], 78 y.o.   MRN: [id number]   [facility] Progress Note    Subjective:   Reports to be feeling better, denies any further abdominal pain. States that she does not have any shortness of breath or chest pain. Still having some terminal dysuria, no flank pain/hematuria.    Objective:   BP 129/83  Pulse 110  Temp 98.7 F (37.1 C) (Oral)  Resp 20  Ht 5' 2.99" (1.6 m)  Wt 88.2 kg (194 lb 7.1 oz)  BMI 34.45 kg/m2  SpO2 98%  Intake/Output Summary (Last 24 hours) at 12/25/11 0853 Last data filed at 12/24/11 1300  Gross per 24 hour  Intake    603 ml  Output    750 ml  Net   -147 ml   Weight change:   Physical Exam: Gen: Comfortably resting out of her bed in the recliner, empty breakfast tray by bedside CVS: Pulse regular in rate and rhythm, heart sounds S1 and S2 normal Resp: Clear to auscultation bilaterally, no rales/rhonchi Abd: Soft, obese, nontender and bowel sounds are normal Ext: One plus bilateral lower extremity edema-chronic venous status dermatitis  Imaging: Nm Hepato W/eject Fract  12/23/2011  *RADIOLOGY REPORT*  Clinical Data:  Right upper quadrant abdominal pain.  NUCLEAR MEDICINE HEPATOBILIARY IMAGING WITH GALLBLADDER EF  Technique:  Sequential images of the abdomen were obtained out to 60 minutes following intravenous administration of radiopharmaceutical.  After slow intravenous infusion of 1.8 micrograms Cholecystokinin, gallbladder ejection fraction was determined.  Radiopharmaceutical:  5.0 mCi Tc-50m Choletec  Comparison:  Ultrasound 12/22/2011.  Findings: There is symmetric uptake in the liver and prompt excretion into the biliary tree which is visualized by 15 minutes. The gallbladder is visualized at .  Activity is noted in the small bowel at 25 minutes.  The patient received a protocoled infusion of CCK and a gallbladder ejection fraction was estimated at 37%.  Normal is greater than 30%.   The patient did not experienced symptoms during CCK infusion.  IMPRESSION:  Normal biliary patency study and low normal gallbladder ejection fraction.   Original Report Authenticated By: P. Loralie Champagne, M.D.     Labs: BMET  Lab 12/25/11 0531 12/24/11 1010 12/23/11 0650 12/22/11 1434  NA 137 136 133* 131*  K 3.9 4.0 3.9 4.3  CL 103 100 97 94*  CO2 22 24 21 24   GLUCOSE 87 146* 83 122*  BUN 45* 54* 55* 52*  CREATININE 3.66* 4.14* 4.37* 4.14*  ALB -- -- -- --  CALCIUM 9.0 8.6 8.5 8.9  PHOS 4.0 4.1 -- --   CBC  Lab 12/25/11 0531 12/23/11 0650 12/22/11 1434  WBC 6.9 14.9* 18.0*  NEUTROABS -- -- 14.5*  HGB 9.4* 8.7* 9.1*  HCT 28.9* 26.0* 28.8*  MCV 88.7 88.1 90.0  PLT 132* 104* 112*    Medications:       . amiodarone  200 mg Oral Daily  . amitriptyline  50 mg Oral QHS  . cephALEXin  250 mg Oral Q12H  . darbepoetin (ARANESP) injection - NON-DIALYSIS  100 mcg Subcutaneous Q Thu-1800  . docusate sodium  100 mg Oral BID  . ferumoxytol  1,020 mg Intravenous Once  . insulin aspart  0-9 Units Subcutaneous TID WC  . insulin glargine  5 Units Subcutaneous QHS  . sodium chloride  3 mL Intravenous Q12H  . vitamin B-12  1,000 mcg Oral Daily  . DISCONTD: ceFEPime (MAXIPIME) IV  1 g  Intravenous Q24H  . DISCONTD: cefTRIAXone (ROCEPHIN)  IV  1 g Intravenous Q24H     Assessment/ Plan:   1. Acute renal failure chronic kidney disease stage III: Suspected to be multifactorial central to which appears to be cardiorenal syndrome and recent exposure to intravenous gentamicin/ATN. Labs yesterday indicated improving renal function and urine output remains in the nonoliguric range (inadequately collected per the patient). Glomerulonephritis workup so far is negative with negative serologies-no evidence of are of AIN. Renal imaging was significant for no hydronephrosis/obstructive pathology. Labs from this morning to show improving renal function. We'll start low-dose diuretic (furosemide) today  and consider discharge within the next 24 hours with outpatient followup. Clinically, she is without any emerging uremic symptoms. 2. Urinary tract infection with what appears to be early SIRS: Switched over to oral cephalexin therapy based on sensitivities after discontinuation of intravenous cefepime that was used earlier. 3. Abdominal pain: Clinically better, HIDA scan negative for any obstruction/reductions in GB ejection fraction.  4. Anemia of chronic kidney disease: Given intravenous iron yesterday, plan to start ESA therapy today.  5. Diabetes mellitus: Satisfactory glycemic control with ongoing Lantus/3 times a day a.c. NovoLog sliding scale.   Zetta Bills, MD 12/25/2011, 8:53 AM

## 2011-12-26 LAB — RENAL FUNCTION PANEL
Albumin: 2.7 g/dL — ABNORMAL LOW (ref 3.5–5.2)
Calcium: 9.1 mg/dL (ref 8.4–10.5)
GFR calc Af Amer: 14 mL/min — ABNORMAL LOW (ref >90)
Glucose, Bld: 97 mg/dL (ref 70–99)
Phosphorus: 3.8 mg/dL (ref 2.3–4.6)
Potassium: 3.6 mEq/L (ref 3.5–5.1)
Sodium: 137 mEq/L (ref 135–145)

## 2011-12-26 LAB — PROTIME-INR: Prothrombin Time: 18.2 seconds — ABNORMAL HIGH (ref 11.6–15.2)

## 2011-12-26 LAB — GLUCOSE, CAPILLARY
Glucose-Capillary: 114 mg/dL — ABNORMAL HIGH (ref 70–99)
Glucose-Capillary: 125 mg/dL — ABNORMAL HIGH (ref 70–99)

## 2011-12-26 LAB — CBC
HCT: 28.6 % — ABNORMAL LOW (ref 36.0–46.0)
MCHC: 32.5 g/dL (ref 30.0–36.0)
MCV: 87.7 fL (ref 78.0–100.0)
Platelets: 132 10*3/uL — ABNORMAL LOW (ref 150–400)
RDW: 14.1 % (ref 11.5–15.5)
WBC: 7.1 10*3/uL (ref 4.0–10.5)

## 2011-12-26 MED ORDER — FUROSEMIDE 40 MG PO TABS: 40.0000 mg | ORAL_TABLET | Freq: Every day | ORAL | Status: DC

## 2011-12-26 MED ORDER — WARFARIN SODIUM 2.5 MG PO TABS
2.5000 mg | ORAL_TABLET | Freq: Every day | ORAL | Status: DC
  Filled 2011-12-26: qty 1

## 2011-12-26 MED ORDER — CEPHALEXIN 250 MG PO CAPS: 250.0000 mg | ORAL_CAPSULE | Freq: Two times a day (BID) | ORAL | Status: DC

## 2011-12-26 NOTE — Progress Notes (Signed)
D/c orders received;IV removed with gauze on, pt remains in stable condition, pt meds and instructions reviewed and given to pt; pt d/c to home, [facility] PT/OT/RN setup

## 2011-12-26 NOTE — Discharge Summary (Signed)
Physician Discharge Summary  [name]:[id number] DOB: Aug 20, 1925 DOA: 12/22/2011  PCP: Lenora Boys, MD  Admit date: 12/22/2011 Discharge date: 12/26/2011  Recommendations for Outpatient Follow-up:  1. Repeat PT and INR in 3 days and Coumadin dose adjustment per PCP. 2. Followup with Dr. Marinda Elk, PCP in one week from hospital discharge. At this visit to be evaluated regarding antihypertensive and diabetic medications which are currently on hold. 3. Followup with Dr. Marjory Sneddon on 01/01/2012 at 1:30 PM with labs-BMP 4. Followup with Dr. Carolanne Grumbling 5. Consider outpatient urology consultation.  Discharge Diagnoses:  Principal Problem:  *Renal failure (ARF), acute on chronic Active Problems:  Anemia  Diabetes mellitus type II  Hypertension  UTI (lower urinary tract infection)  A-fib  CKD (chronic kidney disease) stage 4, GFR 15-29 ml/min  RUQ abdominal pain  Acute on chronic diastolic CHF (congestive heart failure), NYHA class 2  Thrombocytopenia  Colon cancer   Discharge Condition: Improved and stable.  Diet recommendation: Heart healthy and diabetic diet.  Filed Weights   12/22/11 1537  Weight: 88.2 kg (194 lb 7.1 oz)    History of present illness:  [name] is a 77 y.o. female with history of chronic kidney disease, DM 2, hypertension, recurrent UTIs, atrial fibrillation, congestive heart failure, chronic Coumadin who was recently discharged from [facility] after being admitted for acute on chronic renal failure and Pseudomonas urinary tract infection was brought back by family for persistent right upper quadrant abdominal pain, nausea, decreased appetite, increased weakness. In the emergency room the patient was found to have worsening renal failure hypotension leukocytosis and was referred for admission.   Hospital Course:  1. Recurrent UTI - last Cx pseudomonas but current one shows Escherichia coli - patient was initially placed on IV  cefepime but once the sensitivities were available and patient had clinically improved, she was switched to oral Keflex which he as tolerated. She has done well and indicates that she has occasional minimal dysuria which has significantly improved compared to admission. She denies hematuria. She is to complete a total 14 days of antibiotics. According to her daughter Inetta Fermo, patient has had recurrent urinary tract infections and she does use cranberry juice at home. Please consider urology consultation to rule out mechanical problems as a cause of her recurrent urinary tract infections and hematuria.  2. Acute on stage III chronic renal failure : Nephrology was consulted.Suspected to be multifactorial central to which appears to be cardiorenal syndrome and recent exposure to intravenous gentamicin/ATN. Glomerulonephritis workup so far is negative with negative serologies-no evidence of AIN. Renal imaging was significant for no hydronephrosis/obstructive pathology.Her diuretics and antihypertensives were held. She was not uremic. Urine output was initially marginal. Her creatinine then started improving. She has been started on reduced dose of Lasix and is diuresing well. Nephrology has cleared her for discharge and will followup in their offices on 01/01/12. Continue to hold her antihypertensive medications for now until she follows with her PCP in a week's time. 3. RUQ abd pain reported on admission - Abd [place] without gallstones, but with distended GB - HIDA scan negative. Resolved 4. Atrial fibrillation status post direct-current cardioversion. Controlled ventricular rate. Continue amiodarone Continue full dose Coumadin per pharmacy. INR is subtherapeutic, probably because her Coumadin was held for a couple of days due to the initial coagulopathy from poor oral intake. Followup repeat PT/INR in 3 days and adjust Coumadin appropriately.  5. Anemia - chronic due to CKD and chronic  ds. Stable 6. Remote hx of colon  cancer with chronic colostomy since 1992 - has large asymptomatic parastomal hernia.  7. DM 2 - patient's blood sugars are well controlled in the hospital without Lantus or significant sliding scale insulin for the last couple of days. She is at risk for hypoglycemia. Thereby all her diabetic medications will be held temporarily. This can be reassessed when she sees her primary care physician in one week from hospital discharge. 8. Thrombocytopenia: Stable. Unclear etiology. Outpatient followup. Has had it intermittently in the past.   Consultants:  [facility]   Procedures:  Abdominal US   Antibiotics:  Cefepime 9/30-10/2  Keflex 10/2   Discharge Exam:  Complaints Minimal terminal dysuria. No blood in urine. Denies abdominal pain. Eager to go home.     Filed Vitals:   12/25/11 0600 12/25/11 1430 12/25/11 2100 12/26/11 0500  BP: 129/83 120/69 148/74 133/68  Pulse: 110 93 105 92  Temp: 98.7 F (37.1 C) 98.5 F (36.9 C) 98.4 F (36.9 C) 98 F (36.7 C)  TempSrc: Oral Oral    Resp: 20 16 20 18   Height:      Weight:      SpO2: 98% 99% 95% 91%    General: Comfortable.  Cardiovascular: RRR, no M,R,G. Telemetry shows A. fib in the 80s to 90s.  Respiratory: CTAB. No increased work of breathing.  Abdomen: soft, nontender and normal bowel sounds heard. Colostomy left lower quadrant with soft stools.  Central nervous system: Alert and oriented. No focal neurological deficits.  Discharge Instructions  Discharge Orders    Future Orders Please Complete By Expires   Diet - low sodium heart healthy      Diet Carb Modified      Increase activity slowly      Call MD for:  difficulty breathing, headache or visual disturbances      Call MD for:  persistant dizziness or light-headedness      Call MD for:  extreme fatigue          Medication List     As of 12/26/2011  5:45 PM    STOP taking these medications         amLODipine 10 MG tablet   Commonly known as: NORVASC       carvedilol 12.5 MG tablet   Commonly known as: COREG      insulin glargine 100 UNIT/ML injection   Commonly known as: LANTUS      sitaGLIPtin 25 MG tablet   Commonly known as: JANUVIA      TAKE these medications         acetaminophen 500 MG tablet   Commonly known as: TYLENOL   Take 500-1,000 mg by mouth every 6 (six) hours as needed. fever      ALPRAZolam 0.5 MG tablet   Commonly known as: XANAX   Take 0.5 mg by mouth 2 (two) times daily as needed. For anxiety      amiodarone 200 MG tablet   Commonly known as: PACERONE   Take 100-200 mg by mouth daily. Takes 200 mg in the morning and 100 mg at night      amitriptyline 50 MG tablet   Commonly known as: ELAVIL   Take 50 mg by mouth at bedtime.      cephALEXin 250 MG capsule   Commonly known as: KEFLEX   Take 1 capsule (250 mg total) by mouth every 12 (twelve) hours.      docusate sodium 100  MG capsule   Commonly known as: COLACE   Take 100 mg by mouth daily as needed. For constipation      furosemide 40 MG tablet   Commonly known as: LASIX   Take 1 tablet (40 mg total) by mouth daily.      vitamin B-12 1000 MCG tablet   Commonly known as: CYANOCOBALAMIN   Take 1,000 mcg by mouth daily.      warfarin 5 MG tablet   Commonly known as: COUMADIN   Take 2.5 mg by mouth daily. 2.5 mg 7 days week         Follow-up Information    Follow up with FRIED, ROBERT L, MD. Schedule an appointment as soon as possible for a visit in 3 days. (for blood tests (PT & INR) and COumadin dose adjustment.)    Contact information:   [address] [place] [address] [phone]       Follow up with FRIED, ROBERT L, MD. Schedule an appointment as soon as possible for a visit in 1 week.   Contact information:   [address] [place] [address] [phone]       Follow up with Cecille Aver, MD. On 01/01/2012. (at 1:30 pm with labs (BMP))    Contact information:   [address] [place] [address] [phone]       Follow up with  Quintella Reichert, MD. (keep up previous appointment in mid Oct.)    Contact information:   [address] [place] [address] [phone]           The results of significant diagnostics from this hospitalization (including imaging, microbiology, ancillary and laboratory) are listed below for reference.    Significant Diagnostic Studies: US Abdomen Complete  12/22/2011  *RADIOLOGY REPORT*  Clinical Data:  Right upper quadrant abdominal pain and fever. History of gallstones.  COMPLETE ABDOMINAL ULTRASOUND  Comparison:  Renal ultrasound dated 12/08/2011 and abdomen and pelvis CT dated 10/14/2009.  Findings:  Gallbladder:  No gallstones, gallbladder wall thickening, or pericholecystic fluid.  Common bile duct:  Normal, measuring 5.5 mm in diameter proximally.  Liver:  No focal lesion identified.  Within normal limits in parenchymal echogenicity.  IVC:  Appears normal.  Pancreas:  No pancreatic tissue visualized at the location of the expected position of the pancreas.  Spleen:  Normal, measuring 11.6 cm in length.  Right Kidney:  Diffuse parenchymal thinning and prominent renal sinus fat, measuring 11.4 cm in length.  Mildly echogenic.  No hydronephrosis.  Left Kidney:  Diffuse parenchymal thinning and prominent renal sinus fat, measuring 9.2 cm in length.  Mildly echogenic. As a seen probable mildly complex cysts are not currently visualized. No hydronephrosis.  Abdominal aorta:  Normal in caliber.  Bilateral pleural effusions are noted, greater on the right.  IMPRESSION:  1.  No acute abdominal abnormality. 2.  Probable severe pancreatic atrophy. 3.  Bilateral renal atrophy and evidence of chronic medical renal disease. 4.  Bilateral pleural effusions, greater on the right.   Original Report Authenticated By: Darrol Angel, M.D.    US Renal  12/08/2011  *RADIOLOGY REPORT*  Clinical Data: Acute renal failure  RENAL/URINARY TRACT ULTRASOUND COMPLETE  Comparison:  10/14/2009 CT  Findings:   Right Kidney:  Lobular contour.  Increased echogenicity.  Measures 9.7 cm.  No focal abnormality.  No hydronephrosis.  Left Kidney:  Atrophy with lobular contour and increased/heterogeneous echogenicity.  Measures 9 cm.  No hydronephrosis. Arising from the upper pole, there  is a 1.2 cm hypoechoic focus with increased through transmission.  This is favored to be a mildly complex cyst. Within the interpolar region, there is an exophytic hypoechoic area that measures approximately 1 cm.  This is poorly characterized however when correlated with comparison CT, a simple cyst was present in this location.  Bladder:  Thin-walled, normal sonographic appearance.  Left pleural effusion incidentally noted.  IMPRESSION: Atrophic kidneys with increased echogenicity, in keeping with chronic medical renal disease. No hydronephrosis.  Two hypoechoic foci within the left kidney are favored to be mildly complex cysts.  Recommend six month [place] follow-up.   Original Report Authenticated By: Waneta Martins, M.D.    Nm Hepato W/eject Fract  12/23/2011  *RADIOLOGY REPORT*  Clinical Data:  Right upper quadrant abdominal pain.  NUCLEAR MEDICINE HEPATOBILIARY IMAGING WITH GALLBLADDER EF  Technique:  Sequential images of the abdomen were obtained out to 60 minutes following intravenous administration of radiopharmaceutical.  After slow intravenous infusion of 1.8 micrograms Cholecystokinin, gallbladder ejection fraction was determined.  Radiopharmaceutical:  5.0 mCi Tc-54m Choletec  Comparison:  Ultrasound 12/22/2011.  Findings: There is symmetric uptake in the liver and prompt excretion into the biliary tree which is visualized by 15 minutes. The gallbladder is visualized at .  Activity is noted in the small bowel at 25 minutes.  The patient received a protocoled infusion of CCK and a gallbladder ejection fraction was estimated at 37%.  Normal is greater than 30%.  The patient did not experienced symptoms during CCK infusion.   IMPRESSION:  Normal biliary patency study and low normal gallbladder ejection fraction.   Original Report Authenticated By: P. Loralie Champagne, M.D.    Dg Chest Port 1 View  12/05/2011  *RADIOLOGY REPORT*  Clinical Data: Arrhythmia.  History of hypertension and diabetes.  PORTABLE CHEST - 1 VIEW 12/05/2011 2204 hours:  Comparison: Portable chest x-ray 11/20/2011, 08/27/2011.  Findings: Cardiac silhouette moderately enlarged but stable. Pulmonary vascularity normal.  Focal opacity in the right mid lung, not present on the prior examinations.  Lungs otherwise clear. Possible small pleural effusions.  IMPRESSION: Focal pneumonia suspected in the right mid lung.  Stable cardiomegaly without pulmonary edema.   Original Report Authenticated By: Arnell Sieving, M.D.     Microbiology: Recent Results (from the past 240 hour(s))  URINE CULTURE     Status: Normal   Collection Time   12/22/11  2:50 PM      Component Value Range Status Comment   Specimen Description URINE, CATHETERIZED   Final    Special Requests NONE   Final    Culture  Setup Time 12/22/2011 16:05   Final    Colony Count >=100,000 COLONIES/ML   Final    Culture ESCHERICHIA COLI   Final    Report Status 12/24/2011 FINAL   Final    Organism ID, Bacteria ESCHERICHIA COLI   Final      Labs: Basic Metabolic Panel:  Lab 12/26/11 1610 12/25/11 0531 12/24/11 1010 12/23/11 0650 12/22/11 1434  NA 137 137 136 133* 131*  K 3.6 3.9 4.0 3.9 4.3  CL 102 103 100 97 94*  CO2 24 22 24 21 24   GLUCOSE 97 87 146* 83 122*  BUN 39* 45* 54* 55* 52*  CREATININE 3.34* 3.66* 4.14* 4.37* 4.14*  CALCIUM 9.1 9.0 8.6 8.5 8.9  MG -- -- -- -- --  PHOS 3.8 4.0 4.1 -- --   Liver Function Tests:  Lab 12/26/11 0540 12/25/11 0531 12/24/11 1010 12/22/11 1434  AST -- -- -- 24  ALT -- -- -- 10  ALKPHOS -- -- -- 124*  BILITOT -- -- -- 0.5  PROT -- -- -- 6.5  ALBUMIN 2.7* 2.5* 2.5* 2.9*   No results found for this basename: LIPASE:5,AMYLASE:5 in the last  168 hours No results found for this basename: AMMONIA:5 in the last 168 hours CBC:  Lab 12/26/11 0540 12/25/11 0531 12/23/11 0650 12/22/11 1434  WBC 7.1 6.9 14.9* 18.0*  NEUTROABS -- -- -- 14.5*  HGB 9.3* 9.4* 8.7* 9.1*  HCT 28.6* 28.9* 26.0* 28.8*  MCV 87.7 88.7 88.1 90.0  PLT 132* 132* 104* 112*   Cardiac Enzymes: No results found for this basename: CKTOTAL:5,CKMB:5,CKMBINDEX:5,TROPONINI:5 in the last 168 hours BNP: BNP (last 3 results)  Basename 12/05/11 2205 11/20/11 1544 08/27/11 1931  PROBNP 4125.0* 4130.0* 4980.0*   CBG:  Lab 12/26/11 1157 12/26/11 0739 12/26/11 0645 12/25/11 1948 12/25/11 1634  GLUCAP 125* 96 114* 128* 160*   Other lab data 1. Anemia panel: Iron 12, total iron binding capacity 185, ferritin 577. 2. INR: 1.56. 3. Immunology: ANA negative, myeloperoxidase antibodies less than 1, serum protease 3 less than 1, C3 complement 98, C4 complement 23. 4. UA on admission showed positive nitrites, 30 mg/dL protein, large leukocytes, moderate hemoglobin, too numerous to count white blood cells, 0-2 red blood cells, many squamous epithelial cells and many bacteria.   Time coordinating discharge: Greater than 30 minutes  Signed:  Caryl Manas  [facility] 12/26/2011, 6:18 PM

## 2011-12-26 NOTE — Progress Notes (Signed)
ANTICOAGULATION CONSULT NOTE - Follow Up Consult  Pharmacy Consult for Coumadin Indication: atrial fibrillation  Allergies  Allergen Reactions  . Levaquin (Levofloxacin In D5w) Nausea Only  . Oxycodone Other (See Comments)    Abnormal behavior    Patient Measurements: Height: 5' 2.99" (160 cm) Weight: 194 lb 7.1 oz (88.2 kg) IBW/kg (Calculated) : 52.38    Vital Signs: Temp: 98 F (36.7 C) (10/04 0500) BP: 133/68 mmHg (10/04 0500) Pulse Rate: 92  (10/04 0500)  Labs:  Basename 12/26/11 0540 12/25/11 0531 12/24/11 1010 12/24/11 0537  HGB 9.3* 9.4* -- --  HCT 28.6* 28.9* -- --  PLT 132* 132* -- --  APTT -- -- -- --  LABPROT 18.2* 22.0* -- 32.2*  INR 1.56* 2.01* -- 3.37*  HEPARINUNFRC -- -- -- --  CREATININE 3.34* 3.66* 4.14* --  CKTOTAL -- -- -- --  CKMB -- -- -- --  TROPONINI -- -- -- --    Estimated Creatinine Clearance: 13.4 ml/min (by C-G formula based on Cr of 3.34).   Medications:  Scheduled:     . amiodarone  200 mg Oral Daily  . amitriptyline  50 mg Oral QHS  . cephALEXin  250 mg Oral Q12H  . darbepoetin (ARANESP) injection - NON-DIALYSIS  100 mcg Subcutaneous Q Thu-1800  . docusate sodium  100 mg Oral BID  . furosemide  40 mg Oral Daily  . insulin aspart  0-9 Units Subcutaneous TID WC  . insulin glargine  5 Units Subcutaneous QHS  . sodium chloride  3 mL Intravenous Q12H  . vitamin B-12  1,000 mcg Oral Daily  . warfarin  2.5 mg Oral ONCE-1800  . Warfarin - Pharmacist Dosing Inpatient   Does not apply q1800    Assessment: 78 yr old female on coumadin for afib.  Patient is on coumadin PTA 2.5mg  daily with an admission INR of 3.62.  Coumadin was held for 3 days and home dose of 2.5 mg was resumed yesterday.  Today  INR has dropped to 1.56 which reflects being held x 3 days. No bleeding noted. CBC stable. PLTC 132, same as yesterday.  Goal of Therapy:  INR 2-3   Plan:  Coumadin 2.5mg  po daily Daily PT/INR. Herby Abraham,  Pharm.D. [phone] 12/26/2011 8:14 AM

## 2011-12-26 NOTE — Progress Notes (Signed)
Physical Therapy Treatment Patient Details Name: [name] MRN: [id number] DOB: 06-04-1925 Today's Date: 12/26/2011 Time: 6578-4696 PT Time Calculation (min): 24 min  PT Assessment / Plan / Recommendation Comments on Treatment Session  Pt admitted with acute on chronic renal failure and was able to increase activity tolerance today with increased ambulation distance/independence. Pt hoping to d/c home later today.    Follow Up Recommendations  Home health PT    Barriers to Discharge        Equipment Recommendations  Rolling walker with 5" wheels    Recommendations for Other Services    Frequency Min 3X/week   Plan Discharge plan remains appropriate;Frequency remains appropriate;Equipment recommendations need to be updated    Precautions / Restrictions Precautions Precautions: Fall Restrictions Weight Bearing Restrictions: No   Pertinent Vitals/Pain None    Mobility  Bed Mobility Bed Mobility: Not assessed Transfers Transfers: Sit to Stand;Stand to Sit Sit to Stand: 5: Supervision;With upper extremity assist;From chair/3-in-1 Stand to Sit: 5: Supervision;With upper extremity assist;To chair/3-in-1 Details for Transfer Assistance: Verbal cues for safest hand placement. Ambulation/Gait Ambulation/Gait Assistance: 5: Supervision Ambulation Distance (Feet): 360 Feet (180 feet x 2 trials with seated rest break.) Assistive device: Rolling walker Ambulation/Gait Assistance Details: Verbal cues for tall posture and safety inside RW with tendency to keep RW far from self. Gait Pattern: Step-through pattern;Decreased stride length;Trunk flexed;Wide base of support Stairs: No Wheelchair Mobility Wheelchair Mobility: No    Exercises     PT Diagnosis:    PT Problem List:   PT Treatment Interventions:     PT Goals Acute Rehab PT Goals PT Goal Formulation: With patient Time For Goal Achievement: 12/30/11 Potential to Achieve Goals: Good PT Goal: Sit to Stand - Progress:  Met PT Goal: Stand to Sit - Progress: Met Pt will Ambulate: >150 feet;with modified independence;with least restrictive assistive device PT Goal: Ambulate - Progress: Updated due to goal met  Visit Information  Last PT Received On: 12/26/11 Assistance Needed: +1    Subjective Data  Subjective: "I am hoping I get to go home today." Patient Stated Goal: Return home with family.   Cognition  Overall Cognitive Status: History of cognitive impairments - at baseline Arousal/Alertness: Awake/alert Orientation Level: Appears intact for tasks assessed Behavior During Session: [facility] for tasks performed Cognition - Other Comments: Family reports min STM deifcits.    Balance  Balance Balance Assessed: No  End of Session PT - End of Session Equipment Utilized During Treatment: Gait belt Activity Tolerance: Patient tolerated treatment well Patient left: in bed;with call bell/phone within reach;with family/visitor present Nurse Communication: Mobility status   GP     Cephus Shelling 12/26/2011, 8:39 AM  12/26/2011 Cephus Shelling, PT, DPT [phone]

## 2011-12-26 NOTE — Progress Notes (Signed)
[facility]  Patient Status: New  [facility] is providing the following services: SN, PT and OT  If patient discharges after hours, please call [phone].   [name] 12/26/2011, 1:15 PM

## 2012-01-17 ENCOUNTER — Encounter (HOSPITAL_COMMUNITY): Payer: Self-pay | Admitting: Emergency Medicine

## 2012-01-17 ENCOUNTER — Emergency Department (HOSPITAL_COMMUNITY)
Admission: EM | Admit: 2012-01-17 | Discharge: 2012-01-18 | Disposition: A | Discharge status: Home / Self Care | Payer: Medicare Other | Attending: Emergency Medicine | Admitting: Emergency Medicine

## 2012-01-17 DIAGNOSIS — Z79899 Other long term (current) drug therapy: Secondary | ICD-10-CM | POA: Insufficient documentation

## 2012-01-17 DIAGNOSIS — N289 Disorder of kidney and ureter, unspecified: Secondary | ICD-10-CM

## 2012-01-17 DIAGNOSIS — Z85038 Personal history of other malignant neoplasm of large intestine: Secondary | ICD-10-CM | POA: Insufficient documentation

## 2012-01-17 DIAGNOSIS — E1139 Type 2 diabetes mellitus with other diabetic ophthalmic complication: Secondary | ICD-10-CM | POA: Insufficient documentation

## 2012-01-17 DIAGNOSIS — Z862 Personal history of diseases of the blood and blood-forming organs and certain disorders involving the immune mechanism: Secondary | ICD-10-CM | POA: Insufficient documentation

## 2012-01-17 DIAGNOSIS — H3500 Unspecified background retinopathy: Secondary | ICD-10-CM | POA: Insufficient documentation

## 2012-01-17 DIAGNOSIS — Z7901 Long term (current) use of anticoagulants: Secondary | ICD-10-CM | POA: Insufficient documentation

## 2012-01-17 DIAGNOSIS — I4891 Unspecified atrial fibrillation: Secondary | ICD-10-CM | POA: Insufficient documentation

## 2012-01-17 DIAGNOSIS — I509 Heart failure, unspecified: Secondary | ICD-10-CM | POA: Insufficient documentation

## 2012-01-17 DIAGNOSIS — N390 Urinary tract infection, site not specified: Secondary | ICD-10-CM

## 2012-01-17 DIAGNOSIS — I10 Essential (primary) hypertension: Secondary | ICD-10-CM | POA: Insufficient documentation

## 2012-01-17 DIAGNOSIS — F411 Generalized anxiety disorder: Secondary | ICD-10-CM | POA: Insufficient documentation

## 2012-01-17 LAB — POCT I-STAT, CHEM 8
BUN: 46 mg/dL — ABNORMAL HIGH (ref 6–23)
Chloride: 102 mEq/L (ref 96–112)
Creatinine, Ser: 3 mg/dL — ABNORMAL HIGH (ref 0.50–1.10)
Glucose, Bld: 147 mg/dL — ABNORMAL HIGH (ref 70–99)
Potassium: 3.9 mEq/L (ref 3.5–5.1)

## 2012-01-17 LAB — URINE MICROSCOPIC-ADD ON

## 2012-01-17 LAB — URINALYSIS, ROUTINE W REFLEX MICROSCOPIC
Nitrite: NEGATIVE
Specific Gravity, Urine: 1.012 (ref 1.005–1.030)
Urobilinogen, UA: 0.2 mg/dL (ref 0.0–1.0)
pH: 5.5 (ref 5.0–8.0)

## 2012-01-17 LAB — GLUCOSE, CAPILLARY

## 2012-01-17 MED ORDER — CEFUROXIME AXETIL 250 MG PO TABS: 250.0000 mg | ORAL_TABLET | Freq: Two times a day (BID) | ORAL | Status: DC

## 2012-01-17 MED ORDER — SODIUM CHLORIDE 0.9 % IV SOLN
INTRAVENOUS | Status: DC
  Administered 2012-01-17: 15:00:00 via INTRAVENOUS

## 2012-01-17 MED ORDER — DEXTROSE 5 % IV SOLN
1.0000 g | Freq: Once | INTRAVENOUS | Status: AC
  Administered 2012-01-17: 1 g via INTRAVENOUS
  Filled 2012-01-17: qty 10

## 2012-01-17 NOTE — ED Notes (Signed)
CBG 138

## 2012-01-17 NOTE — ED Notes (Signed)
900 cc of NS had infused. Site clear

## 2012-01-17 NOTE — ED Provider Notes (Signed)
History     CSN: [id number]  Arrival date & time 01/17/12  1209   First MD Initiated Contact with Patient 01/17/12 1246      Chief Complaint  Patient presents with  . Urinary Frequency    (Consider location/radiation/quality/duration/timing/severity/associated sxs/prior treatment) Patient is a 78 y.o. female presenting with frequency. The history is provided by the patient, a relative and medical records.  Urinary Frequency   78 year old, female, who recently was treated for urinary tract infection, presents emergency department complaining of dysuria and frequency.  Since last night.  She has had a mild temperature elevation, as well.  She denies pain anywhere.  She had nausea earlier, but no vomiting.  She denies chills, or diaphoresis.  She was seen at the walk-in clinic today, and noted to have a urinary tract infection.  She was told to come over here for treatment of urinary tract infection, and dehydration.  Evaluation of the laboratory tests performed at the walk-in clinic.  However, demonstrate that she had no ketones in the urine and a normal specific gravity, in the urine.  Past Medical History  Diagnosis Date  . Colon cancer   . Hypertension   . Renal insufficiency, mild   . Anxiety   . Diabetes mellitus     diabetic retinopathy  . Anemia   . Aortic stenosis, mild   . Urinary tract infection   . Dysrhythmia     atrial fibrilation  . CHF (congestive heart failure)     Past Surgical History  Procedure Date  . Abdominal surgery   . Abdominal hysterectomy   . Colon surgery   . Appendectomy   . Colostomy   . Cardioversion 09/30/2011    Procedure: CARDIOVERSION;  Surgeon: Quintella Reichert, MD;  Location: [facility] OR;  Service: Cardiovascular;  Laterality: N/A;  . I&d extremity 11/20/2011    Procedure: IRRIGATION AND DEBRIDEMENT EXTREMITY;  Surgeon: Harvie Junior, MD;  Location: [facility] OR;  Service: Orthopedics;  Laterality: Left;    No family history on file.  History    Substance Use Topics  . Smoking status: Never Smoker   . Smokeless tobacco: Current User    Types: Snuff  . Alcohol Use: No    OB History    Grav Para Term Preterm Abortions TAB SAB Ect Mult Living                  Review of Systems  Constitutional: Negative for chills and diaphoresis.  Gastrointestinal: Positive for nausea. Negative for vomiting.  Genitourinary: Positive for dysuria and frequency.  Musculoskeletal: Negative for back pain.  Neurological: Negative for weakness.  Psychiatric/Behavioral: Negative for confusion.  All other systems reviewed and are negative.    Allergies  Keflet; Levaquin; and Oxycodone  Home Medications   Current Outpatient Rx  Name Route Sig Dispense Refill  . ALPRAZOLAM 0.5 MG PO TABS Oral Take 0.5 mg by mouth 2 (two) times daily as needed. For anxiety    . AMIODARONE HCL 200 MG PO TABS Oral Take 100-200 mg by mouth daily. Takes 200 mg in the morning and 100 mg at night    . AMITRIPTYLINE HCL 50 MG PO TABS Oral Take 50 mg by mouth at bedtime.    Marland Kitchen CARVEDILOL 12.5 MG PO TABS Oral Take 12.5 mg by mouth 2 (two) times daily with a meal.    . DIPHENHYDRAMINE-APAP (SLEEP) 25-500 MG PO TABS Oral Take 1-2 tablets by mouth at bedtime as needed. For pain/sleep    .  DOCUSATE SODIUM 100 MG PO CAPS Oral Take 100 mg by mouth daily as needed. For constipation    . FUROSEMIDE 40 MG PO TABS Oral Take 40 mg by mouth 2 (two) times daily.    Marland Kitchen LINAGLIPTIN 5 MG PO TABS Oral Take 5 mg by mouth every morning.    Marland Kitchen VITAMIN B-12 1000 MCG PO TABS Oral Take 1,000 mcg by mouth daily.    . WARFARIN SODIUM 5 MG PO TABS Oral Take 2.5 mg by mouth every evening. Do no take coumadin on 10/26. Take 2.5mg  on 12/2711. Will have labs drawn on 01/19/12 per pt's daughter to determine next dose.      BP 115/58  Pulse 90  Temp 98.5 F (36.9 C) (Oral)  Resp 20  SpO2 95%  Physical Exam  Nursing note and vitals reviewed. Constitutional: She is oriented to person, place, and  time. She appears well-developed and well-nourished. No distress.  HENT:  Head: Normocephalic and atraumatic.  Eyes: Conjunctivae normal and EOM are normal.  Neck: Normal range of motion. Neck supple.  Cardiovascular: Normal rate, regular rhythm and intact distal pulses.   No murmur heard. Pulmonary/Chest: Effort normal and breath sounds normal. No respiratory distress.  Abdominal: Soft. Bowel sounds are normal. She exhibits no distension. There is no tenderness.  Musculoskeletal: Normal range of motion. She exhibits no edema.  Neurological: She is alert and oriented to person, place, and time. No cranial nerve deficit.  Skin: Skin is warm and dry. No erythema.  Psychiatric: She has a normal mood and affect. Thought content normal.    ED Course  Procedures (including critical care time)   Labs Reviewed  URINALYSIS, ROUTINE W REFLEX MICROSCOPIC  URINE CULTURE   No results found.   No diagnosis found.    MDM  UTI No signs of systemic illness or toxicity        Cheri Guppy, MD 01/17/12 1333

## 2012-01-17 NOTE — ED Notes (Signed)
Patient was discharged with her daughter using teach back method. Verbalizes an understanding

## 2012-01-17 NOTE — ED Notes (Signed)
Daughter stated, we just went to [facility] walk in clinic and she has urinary tract infection.  . She has some lower abd. Pain and a headache.

## 2012-01-19 LAB — URINE CULTURE

## 2012-01-20 NOTE — ED Notes (Signed)
+   Urine Patient treated with Septra-sensitive to same-Chart appended per protocol MD. 

## 2012-03-24 DIAGNOSIS — N184 Chronic kidney disease, stage 4 (severe): Secondary | ICD-10-CM

## 2012-03-24 HISTORY — DX: Chronic kidney disease, stage 4 (severe): N18.4

## 2012-04-09 ENCOUNTER — Other Ambulatory Visit: Payer: Self-pay | Admitting: *Deleted

## 2012-04-09 ENCOUNTER — Encounter (HOSPITAL_COMMUNITY): Payer: Medicare Other

## 2012-04-09 DIAGNOSIS — Z01810 Encounter for preprocedural cardiovascular examination: Secondary | ICD-10-CM

## 2012-04-09 DIAGNOSIS — N184 Chronic kidney disease, stage 4 (severe): Secondary | ICD-10-CM

## 2012-04-15 ENCOUNTER — Other Ambulatory Visit (HOSPITAL_COMMUNITY): Payer: Self-pay | Admitting: *Deleted

## 2012-04-16 ENCOUNTER — Encounter (HOSPITAL_COMMUNITY)
Admission: RE | Admit: 2012-04-16 | Discharge: 2012-04-16 | Disposition: A | Discharge status: Home / Self Care | Payer: Medicare Other | Source: Ambulatory Visit | Attending: Nephrology | Admitting: Nephrology

## 2012-04-16 DIAGNOSIS — D509 Iron deficiency anemia, unspecified: Secondary | ICD-10-CM | POA: Insufficient documentation

## 2012-04-16 MED ORDER — SODIUM CHLORIDE 0.9 % IV SOLN
INTRAVENOUS | Status: DC
  Administered 2012-04-16: 250 mL via INTRAVENOUS

## 2012-04-16 MED ORDER — FERUMOXYTOL INJECTION 510 MG/17 ML
INTRAVENOUS | Status: AC
  Administered 2012-04-16: 510 mg via INTRAVENOUS
  Filled 2012-04-16: qty 17

## 2012-04-16 MED ORDER — FERUMOXYTOL INJECTION 510 MG/17 ML
510.0000 mg | INTRAVENOUS | Status: DC
  Administered 2012-04-16: 510 mg via INTRAVENOUS

## 2012-04-26 ENCOUNTER — Encounter: Payer: Self-pay | Admitting: Vascular Surgery

## 2012-04-27 ENCOUNTER — Encounter: Payer: Self-pay | Admitting: Vascular Surgery

## 2012-04-27 ENCOUNTER — Encounter (INDEPENDENT_AMBULATORY_CARE_PROVIDER_SITE_OTHER): Payer: Medicare Other | Admitting: *Deleted

## 2012-04-27 ENCOUNTER — Ambulatory Visit (INDEPENDENT_AMBULATORY_CARE_PROVIDER_SITE_OTHER): Payer: Medicare Other | Admitting: Vascular Surgery

## 2012-04-27 VITALS — BP 131/74 | HR 80 | Ht 64.0 in | Wt 182.4 lb

## 2012-04-27 DIAGNOSIS — N184 Chronic kidney disease, stage 4 (severe): Secondary | ICD-10-CM

## 2012-04-27 DIAGNOSIS — N186 End stage renal disease: Secondary | ICD-10-CM

## 2012-04-27 DIAGNOSIS — Z01810 Encounter for preprocedural cardiovascular examination: Secondary | ICD-10-CM

## 2012-04-27 NOTE — Progress Notes (Signed)
Subjective:     Patient ID: [name], female   DOB: [birth year], 79 y.o.   MRN: [id number]  HPI this 79 year old female is referred for evaluation for vascular access. She is right-handed. She is not yet on hemodialysis.  Past Medical History  Diagnosis Date  . Colon cancer   . Hypertension   . Renal insufficiency, mild   . Anxiety   . Diabetes mellitus     diabetic retinopathy  . Anemia   . Aortic stenosis, mild   . Urinary tract infection   . Dysrhythmia     atrial fibrilation  . CHF (congestive heart failure)     History  Substance Use Topics  . Smoking status: Never Smoker   . Smokeless tobacco: Current User    Types: Snuff  . Alcohol Use: No    Family History  Problem Relation Age of Onset  . Heart disease Father   . Diabetes Sister   . Diabetes Daughter   . Hyperlipidemia Daughter   . Hypertension Daughter   . Other Daughter     varicose veins  . Cancer Son   . Diabetes Son   . Heart disease Son     before age 8  . Hyperlipidemia Son   . Hypertension Son     Allergies  Allergen Reactions  . Keflet (Cephalexin) Nausea Only  . Levaquin (Levofloxacin In D5w) Nausea Only  . Oxycodone Other (See Comments)    Abnormal behavior    Current outpatient prescriptions:ALPRAZolam (XANAX) 0.5 MG tablet, Take 0.5 mg by mouth 2 (two) times daily as needed. For anxiety, Disp: , Rfl: ;  amitriptyline (ELAVIL) 50 MG tablet, Take 50 mg by mouth at bedtime., Disp: , Rfl: ;  aspirin 81 MG tablet, Take 81 mg by mouth daily., Disp: , Rfl: ;  calcitRIOL (ROCALTROL) 0.25 MCG capsule, Take by mouth. Mon, Wed, Fri, Disp: , Rfl:  carvedilol (COREG) 12.5 MG tablet, Take 12.5 mg by mouth 2 (two) times daily with a meal., Disp: , Rfl: ;  diphenhydramine-acetaminophen (TYLENOL PM) 25-500 MG TABS, Take 1-2 tablets by mouth at bedtime as needed. For pain/sleep, Disp: , Rfl: ;  docusate sodium (COLACE) 100 MG capsule, Take 100 mg by mouth daily as needed. For constipation, Disp: , Rfl: ;   furosemide (LASIX) 40 MG tablet, Take 40 mg by mouth 2 (two) times daily., Disp: , Rfl:  linagliptin (TRADJENTA) 5 MG TABS tablet, Take 5 mg by mouth every morning., Disp: , Rfl: ;  meclizine (ANTIVERT) 12.5 MG tablet, Take by mouth 3 (three) times daily. 1-2 tablets tid, Disp: , Rfl: ;  vitamin B-12 (CYANOCOBALAMIN) 1000 MCG tablet, Take 1,000 mcg by mouth daily., Disp: , Rfl: ;  amiodarone (PACERONE) 200 MG tablet, Take 100-200 mg by mouth daily. Takes 200 mg in the morning and 100 mg at night, Disp: , Rfl:  cefUROXime (CEFTIN) 250 MG tablet, Take 1 tablet (250 mg total) by mouth 2 (two) times daily., Disp: 20 tablet, Rfl: 0;  warfarin (COUMADIN) 5 MG tablet, Take 2.5 mg by mouth every evening. Do no take coumadin on 10/26. Take 2.5mg  on 12/2711. Will have labs drawn on 01/19/12 per pt's daughter to determine next dose., Disp: , Rfl:   BP 131/74  Pulse 80  Ht 5\' 4"  (1.626 m)  Wt 182 lb 6.4 oz (82.736 kg)  BMI 31.31 kg/m2  SpO2 98%  Body mass index is 31.31 kg/(m^2).          Review of Systems  denies chest pain but does have dyspnea on exertion. No hemoptysis, lateralizing weakness, aphasia, amaurosis fugax, diplopia, or syncope.    Objective:   Physical Exam blood pressure 131/74 heart rate 80 respirations 16 Gen.-alert and oriented x3 in no apparent distress HEENT normal for age Lungs no rhonchi or wheezing Cardiovascular regular rhythm no murmurs carotid pulses 3+ palpable no bruits audible Abdomen soft nontender no palpable masses Musculoskeletal free of  major deformities Skin clear -no rashes Neurologic normal Lower extremities 3+ femoral and dorsalis pedis pulses palpable bilaterally with no edema  Today I ordered venous duplex exam for vein mapping of both upper extremities. Her cephalic veins are terrible and small in both upper extremities. Basilic vein on the left is bigger than the right and does appear adequate.  I performed a bedside SonoSite exam of the left  basilic vein which does communicate with the deep system in the upper third of the left arm but does appear adequate for basilic vein transposition    Assessment:     Chronic renal insufficiency not yet on hemodialysis-needs left basilic vein transposition in near future    Plan:     Plan left basilic vein transposition for Dr. Myra Gianotti on Thursday of this week-February 6 discussed at length with patient and she would like to proceed if possible

## 2012-04-30 ENCOUNTER — Encounter (HOSPITAL_COMMUNITY)
Admission: RE | Admit: 2012-04-30 | Discharge: 2012-04-30 | Disposition: A | Discharge status: Home / Self Care | Payer: Medicare Other | Source: Ambulatory Visit | Attending: Nephrology | Admitting: Nephrology

## 2012-04-30 DIAGNOSIS — D509 Iron deficiency anemia, unspecified: Secondary | ICD-10-CM | POA: Insufficient documentation

## 2012-04-30 MED ORDER — FERUMOXYTOL INJECTION 510 MG/17 ML
510.0000 mg | INTRAVENOUS | Status: AC
  Administered 2012-04-30: 510 mg via INTRAVENOUS

## 2012-04-30 MED ORDER — SODIUM CHLORIDE 0.9 % IV SOLN
INTRAVENOUS | Status: AC
  Administered 2012-04-30: 10:00:00 via INTRAVENOUS

## 2012-04-30 MED ORDER — FERUMOXYTOL INJECTION 510 MG/17 ML
INTRAVENOUS | Status: AC
  Administered 2012-04-30: 510 mg via INTRAVENOUS
  Filled 2012-04-30: qty 17

## 2012-05-10 ENCOUNTER — Other Ambulatory Visit: Payer: Self-pay | Admitting: *Deleted

## 2012-05-12 ENCOUNTER — Encounter (HOSPITAL_COMMUNITY): Payer: Self-pay | Admitting: Pharmacy Technician

## 2012-05-16 ENCOUNTER — Emergency Department (HOSPITAL_COMMUNITY)
Admission: EM | Admit: 2012-05-16 | Discharge: 2012-05-16 | Disposition: A | Discharge status: Home / Self Care | Payer: Medicare Other | Attending: Emergency Medicine | Admitting: Emergency Medicine

## 2012-05-16 ENCOUNTER — Emergency Department (HOSPITAL_COMMUNITY): Payer: Medicare Other

## 2012-05-16 ENCOUNTER — Encounter (HOSPITAL_COMMUNITY): Payer: Self-pay

## 2012-05-16 DIAGNOSIS — Y929 Unspecified place or not applicable: Secondary | ICD-10-CM | POA: Insufficient documentation

## 2012-05-16 DIAGNOSIS — S0990XA Unspecified injury of head, initial encounter: Secondary | ICD-10-CM | POA: Insufficient documentation

## 2012-05-16 DIAGNOSIS — Y9389 Activity, other specified: Secondary | ICD-10-CM | POA: Insufficient documentation

## 2012-05-16 DIAGNOSIS — R55 Syncope and collapse: Secondary | ICD-10-CM | POA: Insufficient documentation

## 2012-05-16 DIAGNOSIS — Z862 Personal history of diseases of the blood and blood-forming organs and certain disorders involving the immune mechanism: Secondary | ICD-10-CM | POA: Insufficient documentation

## 2012-05-16 DIAGNOSIS — Z79899 Other long term (current) drug therapy: Secondary | ICD-10-CM | POA: Insufficient documentation

## 2012-05-16 DIAGNOSIS — Z8679 Personal history of other diseases of the circulatory system: Secondary | ICD-10-CM | POA: Insufficient documentation

## 2012-05-16 DIAGNOSIS — G44309 Post-traumatic headache, unspecified, not intractable: Secondary | ICD-10-CM | POA: Insufficient documentation

## 2012-05-16 DIAGNOSIS — I509 Heart failure, unspecified: Secondary | ICD-10-CM | POA: Insufficient documentation

## 2012-05-16 DIAGNOSIS — Z7982 Long term (current) use of aspirin: Secondary | ICD-10-CM | POA: Insufficient documentation

## 2012-05-16 DIAGNOSIS — Z85038 Personal history of other malignant neoplasm of large intestine: Secondary | ICD-10-CM | POA: Insufficient documentation

## 2012-05-16 DIAGNOSIS — F411 Generalized anxiety disorder: Secondary | ICD-10-CM | POA: Insufficient documentation

## 2012-05-16 DIAGNOSIS — N390 Urinary tract infection, site not specified: Secondary | ICD-10-CM | POA: Insufficient documentation

## 2012-05-16 DIAGNOSIS — N289 Disorder of kidney and ureter, unspecified: Secondary | ICD-10-CM | POA: Insufficient documentation

## 2012-05-16 DIAGNOSIS — Z933 Colostomy status: Secondary | ICD-10-CM | POA: Insufficient documentation

## 2012-05-16 DIAGNOSIS — I10 Essential (primary) hypertension: Secondary | ICD-10-CM | POA: Insufficient documentation

## 2012-05-16 DIAGNOSIS — W1809XA Striking against other object with subsequent fall, initial encounter: Secondary | ICD-10-CM | POA: Insufficient documentation

## 2012-05-16 DIAGNOSIS — E119 Type 2 diabetes mellitus without complications: Secondary | ICD-10-CM | POA: Insufficient documentation

## 2012-05-16 DIAGNOSIS — Z794 Long term (current) use of insulin: Secondary | ICD-10-CM | POA: Insufficient documentation

## 2012-05-16 LAB — URINE MICROSCOPIC-ADD ON

## 2012-05-16 LAB — URINALYSIS, ROUTINE W REFLEX MICROSCOPIC
Bilirubin Urine: NEGATIVE
Hgb urine dipstick: NEGATIVE
Ketones, ur: NEGATIVE mg/dL
Nitrite: NEGATIVE
Specific Gravity, Urine: 1.01 (ref 1.005–1.030)
Urobilinogen, UA: 0.2 mg/dL (ref 0.0–1.0)

## 2012-05-16 LAB — RAPID URINE DRUG SCREEN, HOSP PERFORMED
Barbiturates: NOT DETECTED
Benzodiazepines: POSITIVE — AB
Cocaine: NOT DETECTED
Opiates: NOT DETECTED
Tetrahydrocannabinol: NOT DETECTED

## 2012-05-16 LAB — DIFFERENTIAL
Basophils Absolute: 0 10*3/uL (ref 0.0–0.1)
Basophils Relative: 0 % (ref 0–1)
Eosinophils Absolute: 0.1 10*3/uL (ref 0.0–0.7)
Eosinophils Relative: 1 % (ref 0–5)
Lymphocytes Relative: 14 % (ref 12–46)
Monocytes Absolute: 0.5 10*3/uL (ref 0.1–1.0)

## 2012-05-16 LAB — CBC
HCT: 32.1 % — ABNORMAL LOW (ref 36.0–46.0)
MCHC: 32.4 g/dL (ref 30.0–36.0)
MCV: 94.1 fL (ref 78.0–100.0)
Platelets: 142 10*3/uL — ABNORMAL LOW (ref 150–400)
RDW: 14.9 % (ref 11.5–15.5)
WBC: 9.3 10*3/uL (ref 4.0–10.5)

## 2012-05-16 LAB — COMPREHENSIVE METABOLIC PANEL
ALT: 5 U/L (ref 0–35)
AST: 12 U/L (ref 0–37)
Albumin: 3.1 g/dL — ABNORMAL LOW (ref 3.5–5.2)
Calcium: 9.4 mg/dL (ref 8.4–10.5)
Creatinine, Ser: 2.7 mg/dL — ABNORMAL HIGH (ref 0.50–1.10)
GFR calc non Af Amer: 15 mL/min — ABNORMAL LOW (ref >90)
Sodium: 138 mEq/L (ref 135–145)
Total Protein: 6.7 g/dL (ref 6.0–8.3)

## 2012-05-16 MED ORDER — SULFAMETHOXAZOLE-TMP DS 800-160 MG PO TABS
1.0000 | ORAL_TABLET | Freq: Once | ORAL | Status: AC
  Administered 2012-05-16: 1 via ORAL
  Filled 2012-05-16: qty 1

## 2012-05-16 MED ORDER — SULFAMETHOXAZOLE-TRIMETHOPRIM 800-160 MG PO TABS: 1.0000 | ORAL_TABLET | Freq: Two times a day (BID) | ORAL | Status: DC

## 2012-05-16 MED ORDER — SODIUM CHLORIDE 0.9 % IV BOLUS (SEPSIS)
500.0000 mL | Freq: Once | INTRAVENOUS | Status: AC
  Administered 2012-05-16: 500 mL via INTRAVENOUS

## 2012-05-16 MED ORDER — SODIUM CHLORIDE 0.9 % IV SOLN
INTRAVENOUS | Status: DC
  Administered 2012-05-16: 14:00:00 via INTRAVENOUS

## 2012-05-16 NOTE — ED Notes (Signed)
Pt was on phone with daughter and felt "curious all over" and then she fell from a standing position.  Pt does not know how long she was unconscious.

## 2012-05-16 NOTE — ED Provider Notes (Signed)
History     CSN: [id number]  Arrival date & time 05/16/12  1156   First MD Initiated Contact with Patient 05/16/12 1209      Chief Complaint  Patient presents with  . Loss of Consciousness    (Consider location/radiation/quality/duration/timing/severity/associated sxs/prior treatment) HPI Comments: [name] is a 79 y.o. female who states that she was taking her dog, outside, and talking to her daughter, when she suddenly felt weak. Her daughter, felt that she had lost consciousness, because she did not respond when she asked the patient to speak. The patient states that she did not talk because she felt like she couldn't, but she never lost consciousness. She fell and struck the back of her head. The daughter called a family member, who came to the patient. Within 3 minutes. At that time the patient was alert. She was able to be assisted to standing, and able to walk. The patient quickly, felt back at her baseline. There were no other preceding symptoms. She ate well today. She had a similar episode several weeks ago. She was not evaluated after that event. She has a headache. There is no neck pain, back pain, weakness, dizziness, cough, shortness of breath, abdominal pain. There are no known modifying factors.  Patient is a 79 y.o. female presenting with syncope. The history is provided by the patient.  Loss of Consciousness     Past Medical History  Diagnosis Date  . Colon cancer   . Hypertension   . Renal insufficiency, mild   . Anxiety   . Diabetes mellitus     diabetic retinopathy  . Anemia   . Aortic stenosis, mild   . Urinary tract infection   . Dysrhythmia     atrial fibrilation  . CHF (congestive heart failure)     Past Surgical History  Procedure Laterality Date  . Abdominal surgery    . Abdominal hysterectomy    . Colon surgery    . Appendectomy    . Colostomy    . Cardioversion  09/30/2011    Procedure: CARDIOVERSION;  Surgeon: Quintella Reichert, MD;   Location: [facility] OR;  Service: Cardiovascular;  Laterality: N/A;  . I&d extremity  11/20/2011    Procedure: IRRIGATION AND DEBRIDEMENT EXTREMITY;  Surgeon: Harvie Junior, MD;  Location: [facility] OR;  Service: Orthopedics;  Laterality: Left;    Family History  Problem Relation Age of Onset  . Heart disease Father   . Diabetes Sister   . Diabetes Daughter   . Hyperlipidemia Daughter   . Hypertension Daughter   . Other Daughter     varicose veins  . Cancer Son   . Diabetes Son   . Heart disease Son     before age 43  . Hyperlipidemia Son   . Hypertension Son     History  Substance Use Topics  . Smoking status: Never Smoker   . Smokeless tobacco: Current User    Types: Snuff  . Alcohol Use: No    OB History   Grav Para Term Preterm Abortions TAB SAB Ect Mult Living                  Review of Systems  Cardiovascular: Positive for syncope.  All other systems reviewed and are negative.    Allergies  Keflet; Levaquin; and Oxycodone  Home Medications   Current Outpatient Rx  Name  Route  Sig  Dispense  Refill  . ALPRAZolam (XANAX) 0.5 MG tablet   Oral  Take 0.5 mg by mouth 2 (two) times daily as needed. For anxiety         . amitriptyline (ELAVIL) 50 MG tablet   Oral   Take 50 mg by mouth at bedtime.         Marland Kitchen aspirin 81 MG tablet   Oral   Take 81 mg by mouth daily.         . calcitRIOL (ROCALTROL) 0.25 MCG capsule   Oral   Take 0.25 mcg by mouth 3 (three) times a week. Mon, Wed, Fri         . carvedilol (COREG) 12.5 MG tablet   Oral   Take 12.5 mg by mouth 2 (two) times daily with a meal.         . CRANBERRY PO   Oral   Take 1 capsule by mouth daily.         . diphenhydramine-acetaminophen (TYLENOL PM) 25-500 MG TABS   Oral   Take 1-2 tablets by mouth at bedtime as needed. For pain/sleep         . docusate sodium (COLACE) 100 MG capsule   Oral   Take 100 mg by mouth daily as needed. For constipation         . furosemide (LASIX) 40 MG  tablet   Oral   Take 40 mg by mouth daily.          . insulin glargine (LANTUS) 100 UNIT/ML injection   Subcutaneous   Inject 8 Units into the skin at bedtime as needed (if cbg over 150).          Marland Kitchen linagliptin (TRADJENTA) 5 MG TABS tablet   Oral   Take 5 mg by mouth every morning.         . meclizine (ANTIVERT) 12.5 MG tablet   Oral   Take 12.5 mg by mouth 3 (three) times daily as needed for dizziness.          . nitrofurantoin, macrocrystal-monohydrate, (MACROBID) 100 MG capsule   Oral   Take 100 mg by mouth daily.         Marland Kitchen sulfamethoxazole-trimethoprim (BACTRIM DS,SEPTRA DS) 800-160 MG per tablet   Oral   Take 1 tablet by mouth 2 (two) times daily. One po bid x 3 days   14 tablet   0   . vitamin B-12 (CYANOCOBALAMIN) 1000 MCG tablet   Oral   Take 1,000 mcg by mouth daily.           BP 154/83  Pulse 87  Temp(Src) 97.7 F (36.5 C) (Oral)  Resp 18  SpO2 97%  Physical Exam  Nursing note and vitals reviewed. Constitutional: She is oriented to person, place, and time. She appears well-developed.  Obese  HENT:  Head: Normocephalic and atraumatic.  Eyes: Conjunctivae and EOM are normal. Pupils are equal, round, and reactive to light.  Neck: Normal range of motion and phonation normal. Neck supple.  Cardiovascular: Normal rate, regular rhythm and intact distal pulses.   Pulmonary/Chest: Effort normal and breath sounds normal. She exhibits no tenderness.  Abdominal: Soft. She exhibits no distension. There is no tenderness. There is no guarding.  Musculoskeletal: Normal range of motion.  Neurological: She is alert and oriented to person, place, and time. She has normal strength. She exhibits normal muscle tone.  Skin: Skin is warm and dry.  Psychiatric: She has a normal mood and affect. Her behavior is normal. Judgment and thought content normal.    ED Course  Procedures (including critical care time)  Emergency department treatment-IV fluid bolus, and  drip. Oral Septra, for UTI. Observation  Reevaluation: 16:10- patient is alert, cooperative. She has no further complaints. Vital signs reviewed and stable. Family members are in the room. She is on chronic Macrodantin therapy for suppression of UTI. Patient states she is due for a medical evaluation, in 3 days, as preparation for placement of a vascular fistula, for possible dialysis in the future.    Date: 01/09/2012  Rate: 91  Rhythm: atrial fibrillation  QRS Axis: normal  PR and QT Intervals: normal  ST/T Wave abnormalities: normal  PR and QRS Conduction Disutrbances:left posterior fascicular block, right bundle branch block  Narrative Interpretation:   Old EKG Reviewed: unchanged    Labs Reviewed  CBC - Abnormal; Notable for the following:    RBC 3.41 (*)    Hemoglobin 10.4 (*)    HCT 32.1 (*)    Platelets 142 (*)    All other components within normal limits  DIFFERENTIAL - Abnormal; Notable for the following:    Neutrophils Relative 79 (*)    All other components within normal limits  COMPREHENSIVE METABOLIC PANEL - Abnormal; Notable for the following:    BUN 43 (*)    Creatinine, Ser 2.70 (*)    Albumin 3.1 (*)    Alkaline Phosphatase 122 (*)    GFR calc non Af Amer 15 (*)    GFR calc Af Amer 18 (*)    All other components within normal limits  URINE RAPID DRUG SCREEN (HOSP PERFORMED) - Abnormal; Notable for the following:    Benzodiazepines POSITIVE (*)    All other components within normal limits  URINALYSIS, ROUTINE W REFLEX MICROSCOPIC - Abnormal; Notable for the following:    APPearance CLOUDY (*)    Leukocytes, UA MODERATE (*)    All other components within normal limits  URINE MICROSCOPIC-ADD ON - Abnormal; Notable for the following:    Bacteria, UA MANY (*)    All other components within normal limits  URINE CULTURE  TROPONIN I   Ct Head Wo Contrast  05/16/2012  *RADIOLOGY REPORT*  Clinical Data: Loss of consciousness  CT HEAD WITHOUT CONTRAST   Technique:  Contiguous axial images were obtained from the base of the skull through the vertex without contrast.  Comparison: 11/19/2011  Findings: No skull fracture is noted.  Paranasal sinuses and mastoid air cells are unremarkable.  Mild atherosclerotic calcifications of carotid siphon.  Stable cerebral atrophy.  Again noted old infarct in the right posterior MCA territory.  Stable small lacunar infarct in the right basal ganglia.  No acute cortical infarction.  No mass lesion is noted on this unenhanced scan.  No intracranial hemorrhage, mass effect or midline shift.  IMPRESSION: No acute intracranial abnormality.  No acute cortical infarction. Stable chronic findings as described above.   Original Report Authenticated By: Natasha Mead, M.D.    Nursing notes, applicable records and vitals reviewed.  Radiologic Images/Reports reviewed.   1. Near syncope   2. UTI (lower urinary tract infection)       MDM  Near-syncope, with UTI. She has renal insufficiency, but her creatinine, and GFR are improved from her baseline. In October 2013. No serious injury, and fall. Doubt cardiac arrhythmia. No evidence for serious bacterial infection. She is improved, with treatment is stable for discharge.   Plan: Home Medications- Septra; Home Treatments- rest, fluids; Recommended follow up- PCP for repeat urine 10 day     Mechele Collin  Rachel Moulds, MD 05/16/12 [id number]

## 2012-05-18 ENCOUNTER — Encounter (HOSPITAL_COMMUNITY)
Admission: RE | Admit: 2012-05-18 | Discharge: 2012-05-18 | Disposition: A | Discharge status: Home / Self Care | Payer: Medicare Other | Source: Ambulatory Visit | Attending: Vascular Surgery | Admitting: Vascular Surgery

## 2012-05-18 ENCOUNTER — Encounter (HOSPITAL_COMMUNITY): Payer: Self-pay | Admitting: Vascular Surgery

## 2012-05-18 ENCOUNTER — Encounter (HOSPITAL_COMMUNITY): Payer: Self-pay

## 2012-05-18 DIAGNOSIS — Z01812 Encounter for preprocedural laboratory examination: Secondary | ICD-10-CM | POA: Insufficient documentation

## 2012-05-18 DIAGNOSIS — Z01811 Encounter for preprocedural respiratory examination: Secondary | ICD-10-CM | POA: Insufficient documentation

## 2012-05-18 LAB — URINE CULTURE: Colony Count: >=100000

## 2012-05-18 MED ORDER — VANCOMYCIN HCL 10 G IV SOLR
1500.0000 mg | INTRAVENOUS | Status: DC
  Filled 2012-05-18: qty 1500

## 2012-05-18 NOTE — Progress Notes (Signed)
Anesthesia chart review:  Patient is a 79 year old female scheduled for left basilic vein transposition by Dr. Hart Rochester on 05/19/12.  Her PAT visit was earlier today.  I was not asked to evaluate her during her PAT visit.  History includes CKD not yet requiring dialysis, nonsmoker, diabetes mellitus type 2 on insulin, hypertension, mild aortic stenosis, chronic diastolic CHF, atrial fibrillation s/p cardioversion 09/30/11 but recurrent afib in August 2013 (no longer on Coumadin due to fall risk), anemia, anxiety, colon cancer status post resection with colostomy.  She underwent I&D of an elbow laceration in August 2013 and was noted to have recurrent afib and new right BBB at that time. Of note, she was seen in the ED on 05/16/12 for new syncope and found to have a UTI (E.coli). PCP is Dr. Marinda Elk.  Cardiologist is Dr. Victorio Palm visit on 02/13/12 by Cristopher Peru, NP. Patient had reported a possible syncope episode two weeks prior but was otherwise doing well.  No changes were made in her therapy and follow-up with Dr. Mayford Knife in February 2014 as scheduled was recommended.  EKG on 05/16/12 showed afib @ 91 bpm, right BBB, LPFB which appeared stable since at least 12/22/11.    Echo on 09/19/11 showed: 1. Moderate concentric left ventricular hypertrophy.  2. Left ventricular ejection fraction estimated by 2D at 54.5 %.  3. Moderate left atrial enlargement.  4. Mildly thickened mitral valve with calcified anterior MV leaflet.  5. Mitral valve is moderately sclerotic.  6. Mild mitral annular calcification.  7. Trace mitral valve regurgitation.  8. The aortic valve is sclerotic but opens well.  9. Moderate calcification of the aortic valve.  10. Mild aortic valve stenosis.  11. There is mild aortic root dilatation.  12. There is mild tricuspid regurgitation.  13. Analysis of mitral valve inflow, pulmonary vein Doppler and tissue Doppler suggests grade II diastolic dysfunction with elevated left  atrial pressure.  CXR on 05/18/12 showed no acute pulmonary findings, chronic lung changes.   Labs from 05/16/12 noted.  She is for an ISTAT on arrival.  Her EKG appears stable, had only mild AS by 08/2011 echo, and she has had cardiology follow-up within the past four months without chest pain symptoms.  She will be evaluated by her assigned anesthesiologist on the day of surgery.  If no new or worrisome CV/CHF symptoms then likely she can proceed as planned.  I reviewed above history and recent ED visit with Anesthesiologist Dr. Sandford Craze.  She agrees with this plan.  Shonna Chock, PA-C   05/18/12 1625

## 2012-05-18 NOTE — Pre-Procedure Instructions (Signed)
[name]  05/18/2012   Your procedure is scheduled on:  *05/19/12**  Report to [facility] at 730 AM.  Call this number if you have problems the morning of surgery: [phone]   Remember:   Do not eat food or drink liquids after midnight.   Take these medicines the morning of surgery with A SIP OF WATER: xanax,carvendilol   Do not wear jewelry, make-up or nail polish.  Do not wear lotions, powders, or perfumes. You may wear deodorant.  Do not shave 48 hours prior to surgery. Men may shave face and neck.  Do not bring valuables to the hospital.  Contacts, dentures or bridgework may not be worn into surgery.  Leave suitcase in the car. After surgery it may be brought to your room.  For patients admitted to the hospital, checkout time is 11:00 AM the day of  discharge.   Patients discharged the day of surgery will not be allowed to drive  home.  Name and phone number of your driver: family  Special Instructions: Shower using CHG 2 nights before surgery and the night before surgery.  If you shower the day of surgery use CHG.  Use special wash - you have one bottle of CHG for all showers.  You should use approximately 1/3 of the bottle for each shower.   Please read over the following fact sheets that you were given: Pain Booklet, Coughing and Deep Breathing, MRSA Information and Surgical Site Infection Prevention

## 2012-05-19 ENCOUNTER — Ambulatory Visit (HOSPITAL_COMMUNITY): Admission: RE | Admit: 2012-05-19 | Payer: Medicare Other | Source: Ambulatory Visit | Admitting: Vascular Surgery

## 2012-05-19 ENCOUNTER — Telehealth (HOSPITAL_COMMUNITY): Payer: Self-pay | Admitting: Emergency Medicine

## 2012-05-19 ENCOUNTER — Encounter (HOSPITAL_COMMUNITY): Admission: RE | Payer: Self-pay | Source: Ambulatory Visit

## 2012-05-19 SURGERY — TRANSPOSITION, VEIN, BASILIC
Anesthesia: General | Site: Arm Upper | Laterality: Left

## 2012-05-19 NOTE — ED Notes (Signed)
Patient has +Urine culture. Checking to see if appropriately treated. °

## 2012-05-19 NOTE — Progress Notes (Signed)
Called patient's daughter, [name]. She was with patient on the way to the hospital. I informed her  that Dr. Hart Rochester is not feeling well and will need to reschedule her mother's surgery. She stated okay.

## 2012-05-19 NOTE — ED Notes (Signed)
+  Urine. Patient given Bactrim. Resistant. Chart sent to EDP office for review.

## 2012-05-23 ENCOUNTER — Telehealth (HOSPITAL_COMMUNITY): Payer: Self-pay | Admitting: Emergency Medicine

## 2012-05-25 NOTE — ED Notes (Signed)
Treated by PCP

## 2012-05-31 ENCOUNTER — Other Ambulatory Visit: Payer: Self-pay | Admitting: *Deleted

## 2012-06-01 ENCOUNTER — Encounter (HOSPITAL_COMMUNITY): Payer: Self-pay | Admitting: Pharmacy Technician

## 2012-06-01 MED ORDER — VANCOMYCIN HCL 10 G IV SOLR
1500.0000 mg | INTRAVENOUS | Status: AC
  Administered 2012-06-02: 1500 mg via INTRAVENOUS
  Filled 2012-06-01: qty 1500

## 2012-06-02 ENCOUNTER — Ambulatory Visit (HOSPITAL_COMMUNITY): Payer: Medicare Other | Admitting: Anesthesiology

## 2012-06-02 ENCOUNTER — Telehealth: Payer: Self-pay | Admitting: Vascular Surgery

## 2012-06-02 ENCOUNTER — Encounter (HOSPITAL_COMMUNITY): Payer: Self-pay | Admitting: Anesthesiology

## 2012-06-02 ENCOUNTER — Encounter (HOSPITAL_COMMUNITY): Payer: Self-pay | Admitting: *Deleted

## 2012-06-02 ENCOUNTER — Ambulatory Visit (HOSPITAL_COMMUNITY)
Admission: RE | Admit: 2012-06-02 | Discharge: 2012-06-02 | Disposition: A | Discharge status: Home / Self Care | Payer: Medicare Other | Source: Ambulatory Visit | Attending: Vascular Surgery | Admitting: Vascular Surgery

## 2012-06-02 ENCOUNTER — Encounter (HOSPITAL_COMMUNITY)
Admission: RE | Disposition: A | Discharge status: Home / Self Care | Payer: Self-pay | Source: Ambulatory Visit | Attending: Vascular Surgery

## 2012-06-02 DIAGNOSIS — I120 Hypertensive chronic kidney disease with stage 5 chronic kidney disease or end stage renal disease: Secondary | ICD-10-CM | POA: Insufficient documentation

## 2012-06-02 DIAGNOSIS — E1139 Type 2 diabetes mellitus with other diabetic ophthalmic complication: Secondary | ICD-10-CM | POA: Insufficient documentation

## 2012-06-02 DIAGNOSIS — E11319 Type 2 diabetes mellitus with unspecified diabetic retinopathy without macular edema: Secondary | ICD-10-CM | POA: Insufficient documentation

## 2012-06-02 DIAGNOSIS — I4891 Unspecified atrial fibrillation: Secondary | ICD-10-CM | POA: Insufficient documentation

## 2012-06-02 DIAGNOSIS — N185 Chronic kidney disease, stage 5: Secondary | ICD-10-CM

## 2012-06-02 DIAGNOSIS — I509 Heart failure, unspecified: Secondary | ICD-10-CM | POA: Insufficient documentation

## 2012-06-02 DIAGNOSIS — N186 End stage renal disease: Secondary | ICD-10-CM

## 2012-06-02 DIAGNOSIS — Z794 Long term (current) use of insulin: Secondary | ICD-10-CM | POA: Insufficient documentation

## 2012-06-02 DIAGNOSIS — F411 Generalized anxiety disorder: Secondary | ICD-10-CM | POA: Insufficient documentation

## 2012-06-02 HISTORY — PX: AV FISTULA PLACEMENT: SHX1204

## 2012-06-02 LAB — POCT I-STAT 4, (NA,K, GLUC, HGB,HCT)
Potassium: 3.7 mEq/L (ref 3.5–5.1)
Sodium: 141 mEq/L (ref 135–145)

## 2012-06-02 LAB — GLUCOSE, CAPILLARY: Glucose-Capillary: 113 mg/dL — ABNORMAL HIGH (ref 70–99)

## 2012-06-02 SURGERY — ARTERIOVENOUS (AV) FISTULA CREATION
Anesthesia: General | Site: Arm Upper | Laterality: Left | Wound class: Clean

## 2012-06-02 MED ORDER — ONDANSETRON HCL 4 MG/2ML IJ SOLN: 4.0000 mg | Freq: Four times a day (QID) | INTRAMUSCULAR | Status: DC | PRN

## 2012-06-02 MED ORDER — 0.9 % SODIUM CHLORIDE (POUR BTL) OPTIME
TOPICAL | Status: DC | PRN
  Administered 2012-06-02: 1000 mL

## 2012-06-02 MED ORDER — BUPIVACAINE HCL (PF) 0.5 % IJ SOLN
INTRAMUSCULAR | Status: AC
  Filled 2012-06-02: qty 30

## 2012-06-02 MED ORDER — PHENYLEPHRINE HCL 10 MG/ML IJ SOLN
INTRAMUSCULAR | Status: DC | PRN
  Administered 2012-06-02 (×5): 80 ug via INTRAVENOUS

## 2012-06-02 MED ORDER — SODIUM CHLORIDE 0.9 % IR SOLN
Status: DC | PRN
  Administered 2012-06-02: 09:00:00

## 2012-06-02 MED ORDER — LIDOCAINE-EPINEPHRINE (PF) 1 %-1:200000 IJ SOLN
INTRAMUSCULAR | Status: AC
  Filled 2012-06-02: qty 10

## 2012-06-02 MED ORDER — LIDOCAINE HCL 1 % IJ SOLN
INTRAMUSCULAR | Status: DC | PRN
  Administered 2012-06-02: 70 mg via INTRADERMAL

## 2012-06-02 MED ORDER — ONDANSETRON HCL 4 MG/2ML IJ SOLN
INTRAMUSCULAR | Status: DC | PRN
  Administered 2012-06-02: 4 mg via INTRAVENOUS

## 2012-06-02 MED ORDER — THROMBIN 20000 UNITS EX SOLR
CUTANEOUS | Status: AC
  Filled 2012-06-02: qty 20000

## 2012-06-02 MED ORDER — FENTANYL CITRATE 0.05 MG/ML IJ SOLN
INTRAMUSCULAR | Status: DC | PRN
  Administered 2012-06-02 (×2): 25 ug via INTRAVENOUS

## 2012-06-02 MED ORDER — PROPOFOL 10 MG/ML IV BOLUS
INTRAVENOUS | Status: DC | PRN
  Administered 2012-06-02: 100 mg via INTRAVENOUS

## 2012-06-02 MED ORDER — SODIUM CHLORIDE 0.9 % IV SOLN
INTRAVENOUS | Status: DC
  Administered 2012-06-02: 08:00:00 via INTRAVENOUS

## 2012-06-02 MED ORDER — FENTANYL CITRATE 0.05 MG/ML IJ SOLN: 25.0000 ug | INTRAMUSCULAR | Status: DC | PRN

## 2012-06-02 SURGICAL SUPPLY — 39 items
ADH SKN CLS APL DERMABOND .7 (GAUZE/BANDAGES/DRESSINGS) ×1
CANISTER SUCTION 2500CC (MISCELLANEOUS) ×2 IMPLANT
CLIP TI MEDIUM 24 (CLIP) ×2 IMPLANT
CLIP TI WIDE RED SMALL 24 (CLIP) ×2 IMPLANT
CLOTH BEACON ORANGE TIMEOUT ST (SAFETY) ×2 IMPLANT
CORDS BIPOLAR (ELECTRODE) IMPLANT
COVER PROBE W GEL 5X96 (DRAPES) IMPLANT
COVER SURGICAL LIGHT HANDLE (MISCELLANEOUS) ×2 IMPLANT
DECANTER SPIKE VIAL GLASS SM (MISCELLANEOUS) ×2 IMPLANT
DERMABOND ADVANCED (GAUZE/BANDAGES/DRESSINGS) ×1
DERMABOND ADVANCED .7 DNX12 (GAUZE/BANDAGES/DRESSINGS) ×1 IMPLANT
ELECT REM PT RETURN 9FT ADLT (ELECTROSURGICAL) ×2
ELECTRODE REM PT RTRN 9FT ADLT (ELECTROSURGICAL) ×1 IMPLANT
GLOVE BIO SURGEON STRL SZ7 (GLOVE) ×2 IMPLANT
GLOVE BIOGEL PI IND STRL 7.0 (GLOVE) IMPLANT
GLOVE BIOGEL PI IND STRL 7.5 (GLOVE) ×1 IMPLANT
GLOVE BIOGEL PI IND STRL 8 (GLOVE) IMPLANT
GLOVE BIOGEL PI INDICATOR 7.0 (GLOVE) ×2
GLOVE BIOGEL PI INDICATOR 7.5 (GLOVE) ×2
GLOVE BIOGEL PI INDICATOR 8 (GLOVE) ×1
GLOVE ECLIPSE 7.0 STRL STRAW (GLOVE) ×1 IMPLANT
GLOVE SURG SS PI 6.5 STRL IVOR (GLOVE) ×1 IMPLANT
GOWN STRL NON-REIN LRG LVL3 (GOWN DISPOSABLE) ×6 IMPLANT
KIT BASIN OR (CUSTOM PROCEDURE TRAY) ×2 IMPLANT
KIT ROOM TURNOVER OR (KITS) ×2 IMPLANT
NS IRRIG 1000ML POUR BTL (IV SOLUTION) ×2 IMPLANT
PACK CV ACCESS (CUSTOM PROCEDURE TRAY) ×2 IMPLANT
PAD ARMBOARD 7.5X6 YLW CONV (MISCELLANEOUS) ×4 IMPLANT
SPONGE SURGIFOAM ABS GEL 100 (HEMOSTASIS) IMPLANT
SUT MNCRL AB 4-0 PS2 18 (SUTURE) ×2 IMPLANT
SUT PROLENE 6 0 BV (SUTURE) IMPLANT
SUT PROLENE 7 0 BV 1 (SUTURE) ×2 IMPLANT
SUT SILK 2 0 SH (SUTURE) ×2 IMPLANT
SUT VIC AB 3-0 SH 27 (SUTURE) ×2
SUT VIC AB 3-0 SH 27X BRD (SUTURE) ×1 IMPLANT
TOWEL OR 17X24 6PK STRL BLUE (TOWEL DISPOSABLE) ×2 IMPLANT
TOWEL OR 17X26 10 PK STRL BLUE (TOWEL DISPOSABLE) ×2 IMPLANT
UNDERPAD 30X30 INCONTINENT (UNDERPADS AND DIAPERS) ×2 IMPLANT
WATER STERILE IRR 1000ML POUR (IV SOLUTION) ×2 IMPLANT

## 2012-06-02 NOTE — Transfer of Care (Signed)
Immediate Anesthesia Transfer of Care Note  Patient: [name]  Procedure(s) Performed: Procedure(s) with comments: ARTERIOVENOUS (AV) FISTULA CREATION (Left) - Bascilic Fistula  Patient Location: PACU  Anesthesia Type:General  Level of Consciousness: awake, alert , oriented and patient cooperative  Airway & Oxygen Therapy: Patient Spontanous Breathing and Patient connected to nasal cannula oxygen  Post-op Assessment: Report given to PACU RN and Post -op Vital signs reviewed and stable  Post vital signs: Reviewed and stable  Complications: No apparent anesthesia complications

## 2012-06-02 NOTE — Telephone Encounter (Signed)
Message copied by Margaretmary Eddy on Wed Jun 02, 2012  1:57 PM ------      Message from: Phillips Odor      Created: Wed Jun 02, 2012 10:43 AM       Please sched 4 wk f/u w/ BLC            ----- Message -----         From: Fransisco Hertz, MD         Sent: 06/02/2012  10:05 AM           To: Reuel Derby, Melene Plan, RN            [name]      [id number]      1925/06/16            APROCEDURE:      left first stage basilic vein transposition (brachiobasilic arteriovenous fistula) placement            Asst: Lianne Cure, [facility]             Follow-up: 4 weeks       ------

## 2012-06-02 NOTE — Anesthesia Preprocedure Evaluation (Addendum)
Anesthesia Evaluation  Patient identified by MRN, date of birth, ID band Patient awake    Reviewed: Allergy & Precautions, H&P , NPO status , Patient's Chart, lab work & pertinent test results  History of Anesthesia Complications Negative for: history of anesthetic complications  Airway Mallampati: II  Neck ROM: full    Dental  (+) Edentulous Upper, Poor Dentition and Dental Advisory Given   Pulmonary neg pulmonary ROS,  breath sounds clear to auscultation        Cardiovascular hypertension, Pt. on medications and Pt. on home beta blockers +CHF + dysrhythmias Atrial Fibrillation + Valvular Problems/Murmurs AS Rhythm:Irregular Rate:Normal  Cardiologist is Dr. Victorio Palm visit on 02/13/12 by Cristopher Peru, NP. Patient had reported a possible syncope episode two weeks prior but was otherwise doing well.  No changes were made in her therapy and follow-up with Dr. Mayford Knife in February 2014 as scheduled was recommended.   EKG on 05/16/12 showed afib @ 91 bpm, right BBB, LPFB which appeared stable since at least 12/22/11.     Echo on 09/19/11 showed: 1. Moderate concentric left ventricular hypertrophy.   2. Left ventricular ejection fraction estimated by 2D at 54.5 %.   3. Moderate left atrial enlargement.   4. Mildly thickened mitral valve with calcified anterior MV leaflet.   5. Mitral valve is moderately sclerotic.   6. Mild mitral annular calcification.   7. Trace mitral valve regurgitation.   8. The aortic valve is sclerotic but opens well.   9. Moderate calcification of the aortic valve.   10. Mild aortic valve stenosis.   11. There is mild aortic root dilatation.   12. There is mild tricuspid regurgitation.   13. Analysis of mitral valve inflow, pulmonary vein Doppler and tissue Doppler suggests grade II diastolic dysfunction with elevated left atrial pressure    Neuro/Psych Anxiety negative neurological ROS      GI/Hepatic negative GI ROS, Neg liver ROS,   Endo/Other  diabetes, Type 2, Insulin Dependent and Oral Hypoglycemic AgentsMorbid obesity  Renal/GU Renal InsufficiencyRenal disease     Musculoskeletal  (+) Arthritis -, Osteoarthritis,    Abdominal (+) + obese,   Peds  Hematology  (+) Blood dyscrasia, anemia ,   Anesthesia Other Findings   Reproductive/Obstetrics                        Anesthesia Physical Anesthesia Plan  ASA: III  Anesthesia Plan: General   Post-op Pain Management:    Induction: Intravenous  Airway Management Planned: LMA  Additional Equipment:   Intra-op Plan:   Post-operative Plan:   Informed Consent: I have reviewed the patients History and Physical, chart, labs and discussed the procedure including the risks, benefits and alternatives for the proposed anesthesia with the patient or authorized representative who has indicated his/her understanding and acceptance.     Plan Discussed with: CRNA and Surgeon  Anesthesia Plan Comments:         Anesthesia Quick Evaluation

## 2012-06-02 NOTE — Anesthesia Procedure Notes (Signed)
Procedure Name: LMA Insertion Date/Time: 06/02/2012 8:47 AM Performed by: Leona Singleton A Pre-anesthesia Checklist: Patient identified Patient Re-evaluated:Patient Re-evaluated prior to inductionOxygen Delivery Method: Circle system utilized Preoxygenation: Pre-oxygenation with 100% oxygen Intubation Type: IV induction LMA: LMA inserted LMA Size: 4.0 Tube type: Oral Placement Confirmation: positive ETCO2 and breath sounds checked- equal and bilateral Tube secured with: Tape Dental Injury: Teeth and Oropharynx as per pre-operative assessment

## 2012-06-02 NOTE — H&P (Addendum)
[facility] [place]  Brief History and Physical  History of Present Illness  [name] is a 79 y.o. female who presents with chief complaint: chronic kidney disease stage V .  The patient presents today for Left first stage basilic vein transposition.  The patient notes some diabetic neuropathy already in her left hand.  Past Medical History  Diagnosis Date  . Colon cancer   . Renal insufficiency, mild   . Anxiety   . Diabetes mellitus     diabetic retinopathy  . Anemia   . Aortic stenosis, mild   . Urinary tract infection   . Dysrhythmia     atrial fibrilation  . CHF (congestive heart failure)   . Hypertension     tracy turner  . Fall at home   . Colostomy care     colon ca    Past Surgical History  Procedure Laterality Date  . Abdominal surgery    . Abdominal hysterectomy    . Colon surgery    . Appendectomy    . Colostomy    . Cardioversion  09/30/2011    Procedure: CARDIOVERSION;  Surgeon: Quintella Reichert, MD;  Location: [facility] OR;  Service: Cardiovascular;  Laterality: N/A;  . I&d extremity  11/20/2011    Procedure: IRRIGATION AND DEBRIDEMENT EXTREMITY;  Surgeon: Harvie Junior, MD;  Location: [facility] OR;  Service: Orthopedics;  Laterality: Left;    History   Social History  . Marital Status: Widowed    Spouse Name: N/A    Number of Children: N/A  . Years of Education: N/A   Occupational History  . Not on file.   Social History Main Topics  . Smoking status: Never Smoker   . Smokeless tobacco: Current User    Types: Snuff  . Alcohol Use: No  . Drug Use: No  . Sexually Active: No   Other Topics Concern  . Not on file   Social History Narrative  . No narrative on file    Family History  Problem Relation Age of Onset  . Heart disease Father   . Diabetes Sister   . Diabetes Daughter   . Hyperlipidemia Daughter   . Hypertension Daughter   . Other Daughter     varicose veins  . Cancer Son   . Diabetes Son   . Heart disease Son      before age 66  . Hyperlipidemia Son   . Hypertension Son     No current facility-administered medications on file prior to encounter.   Current Outpatient Prescriptions on File Prior to Encounter  Medication Sig Dispense Refill  . ALPRAZolam (XANAX) 0.5 MG tablet Take 0.25 mg by mouth 2 (two) times daily. For anxiety      . calcitRIOL (ROCALTROL) 0.25 MCG capsule Take 0.25 mcg by mouth 3 (three) times a week. Mon, Wed, Fri      . carvedilol (COREG) 12.5 MG tablet Take 12.5 mg by mouth 2 (two) times daily with a meal.      . CRANBERRY PO Take 1 capsule by mouth daily.      . diphenhydramine-acetaminophen (TYLENOL PM) 25-500 MG TABS Take 1-2 tablets by mouth at bedtime as needed. For pain/sleep      . docusate sodium (COLACE) 100 MG capsule Take 100 mg by mouth daily as needed. For constipation      . furosemide (LASIX) 40 MG tablet Take 40 mg by mouth daily.       . insulin  glargine (LANTUS) 100 UNIT/ML injection Inject 8 Units into the skin at bedtime as needed (if cbg over 150).       Marland Kitchen linagliptin (TRADJENTA) 5 MG TABS tablet Take 5 mg by mouth every morning.      . vitamin B-12 (CYANOCOBALAMIN) 1000 MCG tablet Take 1,000 mcg by mouth daily.      . meclizine (ANTIVERT) 12.5 MG tablet Take 12.5 mg by mouth 3 (three) times daily as needed for dizziness.         Allergies  Allergen Reactions  . Keflet (Cephalexin) Nausea Only  . Levaquin (Levofloxacin In D5w) Nausea Only  . Oxycodone Other (See Comments)    Abnormal behavior  . Codeine Nausea Only  . Morphine And Related Nausea Only    Review of Systems: As listed above, otherwise negative.  Physical Examination  Filed Vitals:   06/02/12 0730  BP: 131/63  Pulse: 96  Temp: 97.9 F (36.6 C)  TempSrc: Oral  Resp: 18  Weight: 182 lb 6 oz (82.725 kg)  SpO2: 97%    General: A&O x 3, WDWN  Pulmonary: Sym exp, good air movt, CTAB, no rales, rhonchi, & wheezing  Cardiac: RRR, Nl S1, S2, no Murmurs, rubs or  gallops  Gastrointestinal: soft, NTND, -G/R, - HSM, - masses, - CVAT B  Musculoskeletal: M/S 5/5 throughout , Extremities without ischemic changes , slightly cyanotic fingers on left hand, warm hand  Laboratory: CBC:    Component Value Date/Time   WBC 9.3 05/16/2012 1219   RBC 3.41* 05/16/2012 1219   HGB 9.5* 06/02/2012 0730   HCT 28.0* 06/02/2012 0730   PLT 142* 05/16/2012 1219   MCV 94.1 05/16/2012 1219   MCH 30.5 05/16/2012 1219   MCHC 32.4 05/16/2012 1219   RDW 14.9 05/16/2012 1219   LYMPHSABS 1.3 05/16/2012 1219   MONOABS 0.5 05/16/2012 1219   EOSABS 0.1 05/16/2012 1219   BASOSABS 0.0 05/16/2012 1219    BMP:    Component Value Date/Time   NA 141 06/02/2012 0730   K 3.7 06/02/2012 0730   CL 102 05/16/2012 1219   CO2 24 05/16/2012 1219   GLUCOSE 130* 06/02/2012 0730   BUN 43* 05/16/2012 1219   CREATININE 2.70* 05/16/2012 1219   CALCIUM 9.4 05/16/2012 1219   GFRNONAA 15* 05/16/2012 1219   GFRAA 18* 05/16/2012 1219    Coagulation: Lab Results  Component Value Date   INR 1.56* 12/26/2011   INR 2.01* 12/25/2011   INR 3.37* 12/24/2011   No results found for this basename: PTT   Medical Decision Making  [name] is a 79 y.o. female who presents with: chronic kidney disease stage V .   The patient is scheduled for: Left first stage basilic vein transposition.  Risk, benefits, and alternatives to access surgery were discussed.  The patient is aware the risks include but are not limited to: bleeding, infection, steal syndrome, nerve damage, ischemic monomelic neuropathy, failure to mature, and need for additional procedures.  The patient may be at increased risk for steal given the findings on examination.  The patient is aware this is a stage basilic vein transposition so TWO operations are required to get the fistula usable.  The patient is aware of the risks and agrees to proceed.  Leonides Sake, MD [facility] [place] Office: [phone] Pager:  [phone]  06/02/2012, 8:16 AM

## 2012-06-02 NOTE — Op Note (Signed)
OPERATIVE NOTE   PROCEDURE: 1. left first stage basilic vein transposition (brachiobasilic arteriovenous fistula) placement  PRE-OPERATIVE DIAGNOSIS: chronic kidney disease stage V   POST-OPERATIVE DIAGNOSIS: same as above   SURGEON: Leonides Sake, MD  ASSISTANT(S): Lianne Cure, PAC   ANESTHESIA: general  ESTIMATED BLOOD LOSS: 50 cc  FINDING(S): 1. Somewhat diseased brachial artery 2. Palpable thrill in fistula and palpable radial pulse at end of case  SPECIMEN(S):  none  INDICATIONS:   [name] is a 79 y.o. female who presents with chronic kidney disease stage V .  The patient is scheduled for left first stage basilic vein transposition.  The patient is aware the risks include but are not limited to: bleeding, infection, steal syndrome, nerve damage, ischemic monomelic neuropathy, failure to mature, and need for additional procedures.  The patient is aware of the risks of the procedure and elects to proceed forward.  DESCRIPTION: After full informed written consent was obtained from the patient, the patient was brought back to the operating room and placed supine upon the operating table.  Prior to induction, the patient received IV antibiotics.   After obtaining adequate anesthesia, the patient was then prepped and draped in the standard fashion for a left arm access procedure.  I turned my attention first to identifying the patient's basilic vein and brachial artery.  Using SonoSite guidance, the location of these vessels were marked out on the skin.   I made a transverse incision at the level of the antecubitum and dissected through the subcutaneous tissue and fascia to gain exposure of the brachial artery.  This was noted to be 3 mm in diameter externally.  This was dissected out proximally and distally and controlled with vessel loops .  I then dissected out the basilic vein.  This was noted to be 3 mm in diameter externally.  The distal segment of the vein was ligated with a   2-0 silk, and the vein was transected.  The proximal segment was iinterrogated with serial dilators.  The vein accepted up to a 4.5 mm dilator without any difficulty.  I then instilled the heparinized saline into the vein and clamped it.  At this point, I reset my exposure of the brachial artery and placed the artery under tension proximally and distally.  I made an arteriotomy with a #11 blade, and then I extended the arteriotomy with a Potts scissor.  I injected heparinized saline proximal and distal to this arteriotomy.  The vein was then sewn to the artery in an end-to-side configuration with a running stitch of 7-0 Prolene.  Prior to completing this anastomosis, I allowed the vein and artery to backbleed.  There was no evidence of clot from any vessels.  I completed the anastomosis in the usual fashion and then released all vessel loops and clamps.  There was a palpable thrill in the venous outflow, and there was a palpable radial pulse.  At this point, I irrigated out the surgical wound.  There was no further active bleeding.  The subcutaneous tissue was reapproximated with a running stitch of 3-0 Vicryl.  The skin was then reapproximated with a running subcuticular stitch of 4-0 Vicryl.  The skin was then cleaned, dried, and reinforced with Dermabond.  The patient tolerated this procedure well.   COMPLICATIONS: none  CONDITION: stable  Leonides Sake, MD [facility] [place] Office: [phone] Pager: [phone]  06/02/2012, 10:03 AM

## 2012-06-02 NOTE — Telephone Encounter (Signed)
lvm for pt, sent letter - kf °

## 2012-06-02 NOTE — Anesthesia Postprocedure Evaluation (Signed)
Anesthesia Post Note  Patient: [name]  Procedure(s) Performed: Procedure(s) (LRB): ARTERIOVENOUS (AV) FISTULA CREATION (Left)  Anesthesia type: General  Patient location: PACU  Post pain: Pain level controlled and Adequate analgesia  Post assessment: Post-op Vital signs reviewed, Patient's Cardiovascular Status Stable, Respiratory Function Stable, Patent Airway and Pain level controlled  Last Vitals:  Filed Vitals:   06/02/12 1100  BP:   Pulse: 71  Temp:   Resp: 13    Post vital signs: Reviewed and stable  Level of consciousness: awake, alert  and oriented  Complications: No apparent anesthesia complications

## 2012-06-02 NOTE — Preoperative (Signed)
Beta Blockers   Reason not to administer Beta Blockers:Carvedilol 06/02/12 0530

## 2012-06-05 ENCOUNTER — Encounter (HOSPITAL_COMMUNITY): Payer: Self-pay | Admitting: Vascular Surgery

## 2012-07-01 ENCOUNTER — Encounter: Payer: Self-pay | Admitting: Vascular Surgery

## 2012-07-02 ENCOUNTER — Ambulatory Visit (INDEPENDENT_AMBULATORY_CARE_PROVIDER_SITE_OTHER): Payer: Medicare Other | Admitting: Vascular Surgery

## 2012-07-02 ENCOUNTER — Encounter: Payer: Self-pay | Admitting: Vascular Surgery

## 2012-07-02 VITALS — BP 158/81 | HR 102 | Resp 18 | Ht 63.0 in | Wt 188.0 lb

## 2012-07-02 DIAGNOSIS — N186 End stage renal disease: Secondary | ICD-10-CM

## 2012-07-02 DIAGNOSIS — Z4931 Encounter for adequacy testing for hemodialysis: Secondary | ICD-10-CM

## 2012-07-02 NOTE — Progress Notes (Signed)
[facility] [place]  Postoperative Access Visit  History of Present Illness  [name] is a 79 y.o. year old female who presents for postoperative follow-up for: L 1st BVT (Date: 06/02/12).  The patient's wounds are healed.  The patient notes no steal symptoms.  The patient is able to complete their activities of daily living.  The patient's current symptoms are: none.  Physical Examination  Filed Vitals:   07/02/12 1117  BP: 158/81  Pulse: 102  Resp: 18   LUE: Incision is healed, skin feels warm, hand grip is 5/5, sensation in digits is intact, palpable thrill, bruit can be auscultated   Medical Decision Making  [name] is a 79 y.o. year old female who presents s/p L 1st BVT.  The patient will follow up in 4 weeks for formal access duplex to evaluate maturation of the BVT.  Once >=6 mm, we will proceed with transposition.  Thank you for allowing [place] to participate in this patient's care.  Leonides Sake, MD [facility] [place] Office: [phone] Pager: [phone]

## 2012-07-05 ENCOUNTER — Encounter: Payer: Self-pay | Admitting: Nephrology

## 2012-08-05 ENCOUNTER — Encounter: Payer: Self-pay | Admitting: Vascular Surgery

## 2012-08-06 ENCOUNTER — Encounter (INDEPENDENT_AMBULATORY_CARE_PROVIDER_SITE_OTHER): Payer: Medicare Other | Admitting: *Deleted

## 2012-08-06 ENCOUNTER — Encounter: Payer: Self-pay | Admitting: Vascular Surgery

## 2012-08-06 ENCOUNTER — Ambulatory Visit (INDEPENDENT_AMBULATORY_CARE_PROVIDER_SITE_OTHER): Payer: Medicare Other | Admitting: Vascular Surgery

## 2012-08-06 VITALS — BP 151/72 | HR 47 | Ht 63.0 in | Wt 179.9 lb

## 2012-08-06 DIAGNOSIS — Z4931 Encounter for adequacy testing for hemodialysis: Secondary | ICD-10-CM

## 2012-08-06 DIAGNOSIS — T82897A Other specified complication of cardiac prosthetic devices, implants and grafts, initial encounter: Secondary | ICD-10-CM

## 2012-08-06 DIAGNOSIS — N186 End stage renal disease: Secondary | ICD-10-CM

## 2012-08-06 NOTE — Progress Notes (Signed)
[facility] [place]  Postoperative Access Visit  History of Present Illness  [name] is a 79 y.o. year old female who presents for postoperative follow-up for: L 1st stage BVT (Date: 06/02/12).  The patient's wounds are healed.  The patient notes no steal symptoms.  The patient is able to complete their activities of daily living.  The patient's current symptoms are: none.  Past Medical History  Diagnosis Date  . Colon cancer   . Renal insufficiency, mild   . Anxiety   . Diabetes mellitus     diabetic retinopathy  . Anemia   . Aortic stenosis, mild   . Urinary tract infection   . Dysrhythmia     atrial fibrilation  . CHF (congestive heart failure)   . Hypertension     [name]  . Fall at home   . Colostomy care     colon ca    Past Surgical History  Procedure Laterality Date  . Abdominal surgery    . Abdominal hysterectomy    . Colon surgery    . Appendectomy    . Colostomy    . Cardioversion  09/30/2011    Procedure: CARDIOVERSION;  Surgeon: Quintella Reichert, MD;  Location: [facility] OR;  Service: Cardiovascular;  Laterality: N/A;  . I&d extremity  11/20/2011    Procedure: IRRIGATION AND DEBRIDEMENT EXTREMITY;  Surgeon: Harvie Junior, MD;  Location: [facility] OR;  Service: Orthopedics;  Laterality: Left;  . Av fistula placement Left 06/02/2012    Procedure: ARTERIOVENOUS (AV) FISTULA CREATION;  Surgeon: Fransisco Hertz, MD;  Location: [facility] OR;  Service: Vascular;  Laterality: Left;  Bascilic Fistula    History   Social History  . Marital Status: Widowed    Spouse Name: N/A    Number of Children: N/A  . Years of Education: N/A   Occupational History  . Not on file.   Social History Main Topics  . Smoking status: Never Smoker   . Smokeless tobacco: Current User    Types: Snuff  . Alcohol Use: No  . Drug Use: No  . Sexually Active: No   Other Topics Concern  . Not on file   Social History Narrative  . No narrative on file    Family History   Problem Relation Age of Onset  . Heart disease Father   . Diabetes Sister   . Diabetes Daughter   . Hyperlipidemia Daughter   . Hypertension Daughter   . Other Daughter     varicose veins  . Cancer Son   . Diabetes Son   . Heart disease Son     before age 60  . Hyperlipidemia Son   . Hypertension Son     Current Outpatient Prescriptions on File Prior to Visit  Medication Sig Dispense Refill  . ALPRAZolam (XANAX) 0.5 MG tablet Take 0.25 mg by mouth 2 (two) times daily. For anxiety      . aspirin EC 81 MG tablet Take 81 mg by mouth daily.      . calcitRIOL (ROCALTROL) 0.25 MCG capsule Take 0.25 mcg by mouth 3 (three) times a week. Mon, Wed, Fri      . carvedilol (COREG) 12.5 MG tablet Take 12.5 mg by mouth 2 (two) times daily with a meal.      . CRANBERRY PO Take 1 capsule by mouth daily.      . diphenhydramine-acetaminophen (TYLENOL PM) 25-500 MG TABS Take 1-2 tablets by mouth at bedtime as  needed. For pain/sleep      . docusate sodium (COLACE) 100 MG capsule Take 100 mg by mouth daily as needed. For constipation      . furosemide (LASIX) 40 MG tablet Take 40 mg by mouth daily.       . insulin glargine (LANTUS) 100 UNIT/ML injection Inject 8 Units into the skin at bedtime as needed (if cbg over 150).       Marland Kitchen linagliptin (TRADJENTA) 5 MG TABS tablet Take 5 mg by mouth every morning.      . meclizine (ANTIVERT) 12.5 MG tablet Take 12.5 mg by mouth 3 (three) times daily as needed for dizziness.       . nitrofurantoin, macrocrystal-monohydrate, (MACROBID) 100 MG capsule Take 100 mg by mouth daily. Indefinite course      . phenazopyridine (PYRIDIUM) 100 MG tablet Take 100 mg by mouth daily as needed for pain.      . traZODone (DESYREL) 50 MG tablet Take 50 mg by mouth at bedtime.      . vitamin B-12 (CYANOCOBALAMIN) 1000 MCG tablet Take 1,000 mcg by mouth daily.      . ciprofloxacin (CIPRO) 250 MG tablet       . mupirocin ointment (BACTROBAN) 2 %       . nitrofurantoin (MACRODANTIN)  100 MG capsule       . sulfamethoxazole-trimethoprim (BACTRIM DS) 800-160 MG per tablet        No current facility-administered medications on file prior to visit.    Allergies  Allergen Reactions  . Keflet (Cephalexin) Nausea Only  . Levaquin (Levofloxacin In D5w) Nausea Only  . Oxycodone Other (See Comments)    Abnormal behavior  . Codeine Nausea Only  . Morphine And Related Nausea Only    Review of Systems (Positive items checked otherwise negative)  General: [ ]  Weight loss, [ ]  Weight gain, [ ]   Loss of appetite, [ ]  Fever  Neurologic: [ ]  Dizziness, [ ]  Blackouts, [ ]  Headaches, [ ]  Seizure  Ear/Nose/Throat: [ ]  Change in eyesight, [ ]  Change in hearing, [ ]  Nose bleeds, [ ]  Sore throat  Vascular: [ ]  Pain in legs with walking, [ ]  Pain in feet while lying flat, [ ]  Non-healing ulcer, Stroke, [ ]  "Mini stroke", [ ]  Slurred speech, [ ]  Temporary blindness, [ ]  Blood clot in vein, [ ]  Phlebitis  Pulmonary: [ ]  Home oxygen, [ ]  Productive cough, [ ]  Bronchitis, [ ]  Coughing up blood, [ ]  Asthma, [ ]  Wheezing  Musculoskeletal: [ ]  Arthritis, [ ]  Joint pain, [ ]  Muscle pain  Cardiac: [ ]  Chest pain, [ ]  Chest tightness/pressure, [ ]  Shortness of breath when lying flat, [ ]  Shortness of breath with exertion, [ ]  Palpitations, [ ]  Heart murmur, [ ]  Arrythmia, [ ]  Atrial fibrillation  Hematologic: [ ]  Bleeding problems, [ ]  Clotting disorder, [ ]  Anemia  Psychiatric:  [ ]  Depression, [ ]  Anxiety, [ ]  Attention deficit disorder  Gastrointestinal:  [ ]  Black stool,[ ]   Blood in stool, [ ]  Peptic ulcer disease, [ ]  Reflux, [ ]  Hiatal hernia, [ ]  Trouble swallowing, [ ]  Diarrhea, [ ]  Constipation  Urinary:  [x]  Kidney disease, [ ]  Burning with urination, [ ]  Frequent urination, [ ]  Difficulty urinating  Skin: [ ]  Ulcers, [ ]  Rashes    Physical Examination  Filed Vitals:   08/06/12 1524  BP: 151/72  Pulse: 47   PULM: CTAB  CV: RRR, nl S1, S2  LUE: Incision is healed,  skin feels warm, hand grip is 5/5, sensation in digits is intact, palpable thrill, bruit can be auscultated   L arm access duplex (Date: 08/06/12)  Fistula patent > 6 mm except distal segment (peri-anastomotic segment)  925 c/s at distal segment  Medical Decision Making  WINDA SUMMERALL is a 79 y.o. year old female who presents s/p L 1st stage BVT.  The patient is ready for 2nd stage, i.e. Transposition, which will be done on 28 JUN 14.  Revision of the anastomosis will be necessary, the main segment of the basilic vein looks to be 7 mm.  I suspect the L BVT current is being perfused via a cubital branch, which will be transected and tied off as part of the anastomotic revision.  Thank you for allowing [place] to participate in this patient's care.  Leonides Sake, MD [facility] [place] Office: [phone] Pager: [phone]

## 2012-08-10 ENCOUNTER — Encounter (HOSPITAL_COMMUNITY): Payer: Self-pay | Admitting: Pharmacy Technician

## 2012-08-11 ENCOUNTER — Other Ambulatory Visit: Payer: Self-pay

## 2012-08-12 ENCOUNTER — Encounter (HOSPITAL_COMMUNITY): Payer: Medicare Other

## 2012-08-12 MED ORDER — SODIUM CHLORIDE 0.9 % IV SOLN: INTRAVENOUS | Status: DC

## 2012-08-13 ENCOUNTER — Encounter (HOSPITAL_COMMUNITY): Payer: Self-pay

## 2012-08-13 ENCOUNTER — Telehealth: Payer: Self-pay | Admitting: *Deleted

## 2012-08-13 ENCOUNTER — Encounter (HOSPITAL_COMMUNITY)
Admission: RE | Admit: 2012-08-13 | Discharge: 2012-08-13 | Disposition: A | Discharge status: Home / Self Care | Payer: Medicare Other | Source: Ambulatory Visit | Attending: Vascular Surgery | Admitting: Vascular Surgery

## 2012-08-13 LAB — SURGICAL PCR SCREEN: MRSA, PCR: NEGATIVE

## 2012-08-13 NOTE — Pre-Procedure Instructions (Signed)
[name]  08/13/2012   Your procedure is scheduled on:  May 28  Report to [facility] at 06:30 AM.  Call this number if you have problems the morning of surgery: [phone]   Remember:   Do not eat food or drink liquids after midnight.   Take these medicines the morning of surgery with A SIP OF WATER: Xanax, Carvedilol, Antivert (if needed)     STOP Aspirin, Vitamin B12 today  Do not wear jewelry, make-up or nail polish.  Do not wear lotions, powders, or perfumes. You may wear deodorant.  Do not shave 48 hours prior to surgery. Men may shave face and neck.  Do not bring valuables to the hospital.  Contacts, dentures or bridgework may not be worn into surgery.  Leave suitcase in the car. After surgery it may be brought to your room.  For patients admitted to the hospital, checkout time is 11:00 AM the day of discharge.   Patients discharged the day of surgery will not be allowed to drive home.  Name and phone number of your driver: Family/ friend  Special Instructions: Shower using CHG 2 nights before surgery and the night before surgery.  If you shower the day of surgery use CHG.  Use special wash - you have one bottle of CHG for all showers.  You should use approximately 1/3 of the bottle for each shower.   Please read over the following fact sheets that you were given: Pain Booklet, Coughing and Deep Breathing and Surgical Site Infection Prevention

## 2012-08-13 NOTE — Telephone Encounter (Signed)
Ms Lalla Brothers called to say her mother had a UTI but has been put on antibiotic for 7 days. She still wants to have her surgery on 08/18/12 if possible. I told her we would recheck a U/A but it should be ok to plan on surgery since on antibiotic & no graft insertion planned

## 2012-08-13 NOTE — Progress Notes (Signed)
Left message with Oswaldo Done regarding patient stopping ASA and Chronic A- Fib history. Requested that they call patient back if they did not need patient to stop ASA prior to procedure

## 2012-08-13 NOTE — Progress Notes (Signed)
Requested cardiology records from Dr. Mayford Knife

## 2012-08-17 ENCOUNTER — Encounter: Payer: Self-pay | Admitting: Vascular Surgery

## 2012-08-17 MED ORDER — VANCOMYCIN HCL IN DEXTROSE 1-5 GM/200ML-% IV SOLN
1000.0000 mg | INTRAVENOUS | Status: AC
  Administered 2012-08-18: 1000 mg via INTRAVENOUS
  Filled 2012-08-17: qty 200

## 2012-08-18 ENCOUNTER — Ambulatory Visit (HOSPITAL_COMMUNITY)
Admission: RE | Admit: 2012-08-18 | Discharge: 2012-08-18 | Disposition: A | Discharge status: Home / Self Care | Payer: Medicare Other | Source: Ambulatory Visit | Attending: Vascular Surgery | Admitting: Vascular Surgery

## 2012-08-18 ENCOUNTER — Encounter (HOSPITAL_COMMUNITY)
Admission: RE | Disposition: A | Discharge status: Home / Self Care | Payer: Self-pay | Source: Ambulatory Visit | Attending: Vascular Surgery

## 2012-08-18 ENCOUNTER — Encounter (HOSPITAL_COMMUNITY): Payer: Self-pay | Admitting: Anesthesiology

## 2012-08-18 ENCOUNTER — Encounter (HOSPITAL_COMMUNITY): Payer: Self-pay | Admitting: *Deleted

## 2012-08-18 ENCOUNTER — Ambulatory Visit (HOSPITAL_COMMUNITY): Payer: Medicare Other | Admitting: Anesthesiology

## 2012-08-18 ENCOUNTER — Telehealth: Payer: Self-pay | Admitting: Vascular Surgery

## 2012-08-18 DIAGNOSIS — N186 End stage renal disease: Secondary | ICD-10-CM

## 2012-08-18 DIAGNOSIS — Z794 Long term (current) use of insulin: Secondary | ICD-10-CM | POA: Insufficient documentation

## 2012-08-18 DIAGNOSIS — Z9181 History of falling: Secondary | ICD-10-CM | POA: Insufficient documentation

## 2012-08-18 DIAGNOSIS — Z79899 Other long term (current) drug therapy: Secondary | ICD-10-CM | POA: Insufficient documentation

## 2012-08-18 DIAGNOSIS — F411 Generalized anxiety disorder: Secondary | ICD-10-CM | POA: Insufficient documentation

## 2012-08-18 DIAGNOSIS — Z885 Allergy status to narcotic agent status: Secondary | ICD-10-CM | POA: Insufficient documentation

## 2012-08-18 DIAGNOSIS — Z881 Allergy status to other antibiotic agents status: Secondary | ICD-10-CM | POA: Insufficient documentation

## 2012-08-18 DIAGNOSIS — N185 Chronic kidney disease, stage 5: Secondary | ICD-10-CM | POA: Insufficient documentation

## 2012-08-18 DIAGNOSIS — I359 Nonrheumatic aortic valve disorder, unspecified: Secondary | ICD-10-CM | POA: Insufficient documentation

## 2012-08-18 DIAGNOSIS — Z883 Allergy status to other anti-infective agents status: Secondary | ICD-10-CM | POA: Insufficient documentation

## 2012-08-18 DIAGNOSIS — I509 Heart failure, unspecified: Secondary | ICD-10-CM | POA: Insufficient documentation

## 2012-08-18 DIAGNOSIS — Z85038 Personal history of other malignant neoplasm of large intestine: Secondary | ICD-10-CM | POA: Insufficient documentation

## 2012-08-18 DIAGNOSIS — E1139 Type 2 diabetes mellitus with other diabetic ophthalmic complication: Secondary | ICD-10-CM | POA: Insufficient documentation

## 2012-08-18 DIAGNOSIS — F17200 Nicotine dependence, unspecified, uncomplicated: Secondary | ICD-10-CM | POA: Insufficient documentation

## 2012-08-18 DIAGNOSIS — D649 Anemia, unspecified: Secondary | ICD-10-CM | POA: Insufficient documentation

## 2012-08-18 DIAGNOSIS — I120 Hypertensive chronic kidney disease with stage 5 chronic kidney disease or end stage renal disease: Secondary | ICD-10-CM | POA: Insufficient documentation

## 2012-08-18 DIAGNOSIS — E11319 Type 2 diabetes mellitus with unspecified diabetic retinopathy without macular edema: Secondary | ICD-10-CM | POA: Insufficient documentation

## 2012-08-18 DIAGNOSIS — I4891 Unspecified atrial fibrillation: Secondary | ICD-10-CM | POA: Insufficient documentation

## 2012-08-18 DIAGNOSIS — Z7982 Long term (current) use of aspirin: Secondary | ICD-10-CM | POA: Insufficient documentation

## 2012-08-18 HISTORY — PX: BASCILIC VEIN TRANSPOSITION: SHX5742

## 2012-08-18 LAB — GLUCOSE, CAPILLARY: Glucose-Capillary: 133 mg/dL — ABNORMAL HIGH (ref 70–99)

## 2012-08-18 LAB — POCT I-STAT 4, (NA,K, GLUC, HGB,HCT)
Glucose, Bld: 125 mg/dL — ABNORMAL HIGH (ref 70–99)
HCT: 36 % (ref 36.0–46.0)
Sodium: 141 mEq/L (ref 135–145)

## 2012-08-18 SURGERY — TRANSPOSITION, VEIN, BASILIC
Anesthesia: General | Site: Arm Upper | Laterality: Left | Wound class: Clean

## 2012-08-18 MED ORDER — THROMBIN 20000 UNITS EX KIT
PACK | CUTANEOUS | Status: DC | PRN
  Administered 2012-08-18 (×2): via TOPICAL

## 2012-08-18 MED ORDER — FENTANYL CITRATE 0.05 MG/ML IJ SOLN: 25.0000 ug | INTRAMUSCULAR | Status: DC | PRN

## 2012-08-18 MED ORDER — THROMBIN 20000 UNITS EX SOLR
CUTANEOUS | Status: AC
  Filled 2012-08-18: qty 20000

## 2012-08-18 MED ORDER — ONDANSETRON HCL 4 MG/2ML IJ SOLN
INTRAMUSCULAR | Status: AC
  Filled 2012-08-18: qty 2

## 2012-08-18 MED ORDER — SODIUM CHLORIDE 0.9 % IV SOLN: INTRAVENOUS | Status: DC

## 2012-08-18 MED ORDER — SODIUM CHLORIDE 0.9 % IV SOLN
INTRAVENOUS | Status: DC | PRN
  Administered 2012-08-18 (×2): via INTRAVENOUS

## 2012-08-18 MED ORDER — 0.9 % SODIUM CHLORIDE (POUR BTL) OPTIME
TOPICAL | Status: DC | PRN
  Administered 2012-08-18: 1000 mL

## 2012-08-18 MED ORDER — PHENYLEPHRINE HCL 10 MG/ML IJ SOLN
INTRAMUSCULAR | Status: DC | PRN
  Administered 2012-08-18 (×3): 80 ug via INTRAVENOUS

## 2012-08-18 MED ORDER — ONDANSETRON HCL 4 MG/2ML IJ SOLN
4.0000 mg | Freq: Once | INTRAMUSCULAR | Status: AC
  Administered 2012-08-18: 4 mg via INTRAVENOUS

## 2012-08-18 MED ORDER — SODIUM CHLORIDE 0.9 % IR SOLN
Status: DC | PRN
  Administered 2012-08-18: 09:00:00

## 2012-08-18 MED ORDER — SODIUM CHLORIDE 0.9 % IV SOLN
10.0000 mg | INTRAVENOUS | Status: DC | PRN
  Administered 2012-08-18: 10 ug/min via INTRAVENOUS

## 2012-08-18 MED ORDER — FENTANYL CITRATE 0.05 MG/ML IJ SOLN
INTRAMUSCULAR | Status: DC | PRN
Start: 2012-08-18 — End: 2012-08-18
  Administered 2012-08-18: 50 ug via INTRAVENOUS
  Administered 2012-08-18 (×4): 25 ug via INTRAVENOUS

## 2012-08-18 MED ORDER — ONDANSETRON HCL 4 MG/2ML IJ SOLN
INTRAMUSCULAR | Status: DC | PRN
  Administered 2012-08-18: 4 mg via INTRAVENOUS

## 2012-08-18 MED ORDER — TRAMADOL HCL 50 MG PO TABS: 50.0000 mg | ORAL_TABLET | Freq: Four times a day (QID) | ORAL | Status: DC | PRN

## 2012-08-18 MED ORDER — PROPOFOL 10 MG/ML IV BOLUS
INTRAVENOUS | Status: DC | PRN
  Administered 2012-08-18: 100 mg via INTRAVENOUS

## 2012-08-18 MED ORDER — LIDOCAINE HCL (CARDIAC) 20 MG/ML IV SOLN
INTRAVENOUS | Status: DC | PRN
  Administered 2012-08-18: 40 mg via INTRAVENOUS

## 2012-08-18 SURGICAL SUPPLY — 50 items
ADH SKN CLS APL DERMABOND .7 (GAUZE/BANDAGES/DRESSINGS) ×1
ADH SKN CLS LQ APL DERMABOND (GAUZE/BANDAGES/DRESSINGS) ×1
BANDAGE ELASTIC 4 VELCRO ST LF (GAUZE/BANDAGES/DRESSINGS) ×2 IMPLANT
BANDAGE GAUZE ELAST BULKY 4 IN (GAUZE/BANDAGES/DRESSINGS) ×2 IMPLANT
CANISTER SUCTION 2500CC (MISCELLANEOUS) ×2 IMPLANT
CLIP TI MEDIUM 24 (CLIP) ×2 IMPLANT
CLIP TI WIDE RED SMALL 24 (CLIP) ×2 IMPLANT
CLOTH BEACON ORANGE TIMEOUT ST (SAFETY) ×2 IMPLANT
CORDS BIPOLAR (ELECTRODE) IMPLANT
COVER PROBE W GEL 5X96 (DRAPES) IMPLANT
COVER SURGICAL LIGHT HANDLE (MISCELLANEOUS) ×2 IMPLANT
DECANTER SPIKE VIAL GLASS SM (MISCELLANEOUS) ×2 IMPLANT
DERMABOND ADHESIVE PROPEN (GAUZE/BANDAGES/DRESSINGS) ×1
DERMABOND ADVANCED (GAUZE/BANDAGES/DRESSINGS) ×1
DERMABOND ADVANCED .7 DNX12 (GAUZE/BANDAGES/DRESSINGS) ×1 IMPLANT
DERMABOND ADVANCED .7 DNX6 (GAUZE/BANDAGES/DRESSINGS) IMPLANT
DRAIN HEMOVAC 1/8 X 5 (WOUND CARE) ×1 IMPLANT
ELECT REM PT RETURN 9FT ADLT (ELECTROSURGICAL) ×2
ELECTRODE REM PT RTRN 9FT ADLT (ELECTROSURGICAL) ×1 IMPLANT
EVACUATOR SILICONE 100CC (DRAIN) ×1 IMPLANT
GAUZE SPONGE 4X4 16PLY XRAY LF (GAUZE/BANDAGES/DRESSINGS) ×1 IMPLANT
GLOVE BIO SURGEON STRL SZ 6.5 (GLOVE) ×1 IMPLANT
GLOVE BIO SURGEON STRL SZ7 (GLOVE) ×2 IMPLANT
GLOVE BIOGEL PI IND STRL 6.5 (GLOVE) IMPLANT
GLOVE BIOGEL PI IND STRL 7.0 (GLOVE) IMPLANT
GLOVE BIOGEL PI IND STRL 7.5 (GLOVE) ×1 IMPLANT
GLOVE BIOGEL PI INDICATOR 6.5 (GLOVE) ×3
GLOVE BIOGEL PI INDICATOR 7.0 (GLOVE) ×2
GLOVE BIOGEL PI INDICATOR 7.5 (GLOVE) ×1
GLOVE ECLIPSE 6.5 STRL STRAW (GLOVE) ×1 IMPLANT
GOWN PREVENTION PLUS XXLARGE (GOWN DISPOSABLE) ×1 IMPLANT
GOWN STRL NON-REIN LRG LVL3 (GOWN DISPOSABLE) ×4 IMPLANT
KIT BASIN OR (CUSTOM PROCEDURE TRAY) ×2 IMPLANT
KIT ROOM TURNOVER OR (KITS) ×2 IMPLANT
LOOP VESSEL MAXI BLUE (MISCELLANEOUS) ×1 IMPLANT
NS IRRIG 1000ML POUR BTL (IV SOLUTION) ×2 IMPLANT
PACK CV ACCESS (CUSTOM PROCEDURE TRAY) ×2 IMPLANT
PAD ARMBOARD 7.5X6 YLW CONV (MISCELLANEOUS) ×4 IMPLANT
SPONGE SURGIFOAM ABS GEL 100 (HEMOSTASIS) IMPLANT
SUT ETHILON 3 0 PS 1 (SUTURE) ×1 IMPLANT
SUT MNCRL AB 4-0 PS2 18 (SUTURE) ×4 IMPLANT
SUT PROLENE 6 0 BV (SUTURE) IMPLANT
SUT PROLENE 7 0 BV 1 (SUTURE) ×2 IMPLANT
SUT SILK 2 0 SH (SUTURE) ×2 IMPLANT
SUT VIC AB 3-0 SH 27 (SUTURE) ×14
SUT VIC AB 3-0 SH 27X BRD (SUTURE) ×1 IMPLANT
TOWEL OR 17X24 6PK STRL BLUE (TOWEL DISPOSABLE) ×2 IMPLANT
TOWEL OR 17X26 10 PK STRL BLUE (TOWEL DISPOSABLE) ×2 IMPLANT
UNDERPAD 30X30 INCONTINENT (UNDERPADS AND DIAPERS) ×2 IMPLANT
WATER STERILE IRR 1000ML POUR (IV SOLUTION) ×2 IMPLANT

## 2012-08-18 NOTE — Op Note (Signed)
OPERATIVE NOTE   PROCEDURE: left second stage basilic vein transposition (brachiobasilic arteriovenous fistula) placement  PRE-OPERATIVE DIAGNOSIS: chronic kidney disease stage IV-V   POST-OPERATIVE DIAGNOSIS: same as above   SURGEON: Leonides Sake, MD  ANESTHESIA: general  ESTIMATED BLOOD LOSS: 50 cc  FINDING(S): 1.  Widely patent basilic vein transposition 6-8 mm throughout with strong thrill 2.  No evidence of stenosis of cubital branch perfusing fistula.  SPECIMEN(S):  none  INDICATIONS:   [name] is a 79 y.o. female who presents with chronic kidney disease stage IV-V and s/p successful first stage basilic vein transposition.  The patient is scheduled for left second stage basilic vein transposition.  The patient is aware the risks include but are not limited to: bleeding, infection, steal syndrome, nerve damage, ischemic monomelic neuropathy, failure to mature, and need for additional procedures.  The patient is aware of the risks of the procedure and elects to proceed forward.  DESCRIPTION: After full informed written consent was obtained from the patient, the patient was brought back to the operating room and placed supine upon the operating table.  Prior to induction, the patient received IV antibiotics.   After obtaining adequate anesthesia, the patient was then prepped and draped in the standard fashion for a left arm access procedure.  I turned my attention first to identifying the patient's brachiobasilic arteriovenous fistula.  Using SonoSite guidance, the location of this fistula was marked out on the skin.   I made an longitudinal incision over the fistula from its arterial anastomosis up to its axillary extent.  I carefully dissected the fistula away from its adjacent nerves.  Eventually the entirety of this fistula was mobilized and I dissected a plane on top of the bicipital fascia with electrocautery.  Also with electrocautery, I developed a curvilinear subcutaneous  trough in which to transpose the fistula into.  The fistula was secured in its new location with loosely placed interrupt 3-0 Vicryl stitches, taking care to avoid compressing the fistula.  The deep subcutaneous tissue was inspected for bleeding.  Bleeding was controlled with electrocautery and placement of large pieces of thrombin and gelfoam.  I washed out the surgical site after waiting a few minutes, and there was no further bleeding.  Due to the depth of fat (>5 cm), I elected to place a JP drain.  A 10 Fr JP was placed sharply through the skin distal to the incision.  I placed the JP deep in the wound.  The deep subcutaneous tissue was reapproximated with mattress sutures of 3-0 Vicryl to eliminate some of the dead space.  I then ran another layer of 3-0 Vicryl in the deep subcutaneous tissue.  The superficial subcutaneous tissue was then reapproximated along the incision line with a running stitch of 3-0 Vicryl.  The skin was then reapproximated with a running subcuticular of 4-0 Monocryl.  The skin was then cleaned, dried, and reinforced with Dermabond.  The left arm was wrapped in a Kerlix and gentle ACE wrap was placed to help with hemostasis.  The JP was placed under bulb suction.  The patient tolerated this procedure well.   COMPLICATIONS: none  CONDITION: stable  Leonides Sake, MD [facility] [place] Office: [phone] Pager: [phone]  08/18/2012, 11:16 AM

## 2012-08-18 NOTE — Transfer of Care (Signed)
Immediate Anesthesia Transfer of Care Note  Patient: [name]  Procedure(s) Performed: Procedure(s) with comments: BASCILIC VEIN TRANSPOSITION (Left) - Left 2nd stage Basilic Vein Transposition   Patient Location: PACU  Anesthesia Type:General  Level of Consciousness: awake, alert , oriented and patient cooperative  Airway & Oxygen Therapy: Patient Spontanous Breathing and Patient connected to nasal cannula oxygen  Post-op Assessment: Report given to PACU RN, Post -op Vital signs reviewed and stable and Patient moving all extremities  Post vital signs: Reviewed and stable  Complications: No apparent anesthesia complications

## 2012-08-18 NOTE — Telephone Encounter (Addendum)
Message copied by Rosalyn Charters on Wed Aug 18, 2012  1:07 PM ------      Message from: Melene Plan      Created: Wed Aug 18, 2012 11:34 AM                   ----- Message -----         From: Fransisco Hertz, MD         Sent: 08/18/2012  11:21 AM           To: Reuel Derby, Melene Plan, RN            KATALYNA SOCARRAS      [id number]      12-04-1925                  PROCEDURE:      left second stage basilic vein transposition (brachiobasilic arteriovenous fistula) placement            Follow-up: 2 days for wound check, 4 weeks             ------  notified patient of fu appt. on 08-20-12 4PM and 10-01-12 at 11 for a lab and then 12:30 to see dr. Imogene Burn

## 2012-08-18 NOTE — Preoperative (Signed)
Beta Blockers   Reason not to administer Beta Blockers:Not Applicable 

## 2012-08-18 NOTE — Interval H&P Note (Signed)
[facility] [place]  History and Physical Update  The patient was interviewed and re-examined.  The patient's previous History and Physical has been reviewed and is unchanged.  There is no change in the plan of care: L 2nd stage BVT.  Leonides Sake, MD [facility] [place] Office: [phone] Pager: [phone]  08/18/2012, 7:44 AM

## 2012-08-18 NOTE — Anesthesia Preprocedure Evaluation (Addendum)
Anesthesia Evaluation  Patient identified by MRN, date of birth, ID band Patient awake    Reviewed: Allergy & Precautions, H&P , NPO status , Patient's Chart, lab work & pertinent test results, reviewed documented beta blocker date and time   Airway Mallampati: II TM Distance: >3 FB Neck ROM: Full    Dental no notable dental hx. (+) Edentulous Lower and Dental Advisory Given   Pulmonary neg pulmonary ROS,  breath sounds clear to auscultation  Pulmonary exam normal       Cardiovascular hypertension, On Medications and Pt. on home beta blockers +CHF + dysrhythmias Atrial Fibrillation + Valvular Problems/Murmurs AS Rhythm:Irregular Rate:Normal + Systolic murmurs    Neuro/Psych negative neurological ROS  negative psych ROS   GI/Hepatic negative GI ROS, Neg liver ROS,   Endo/Other  diabetes, Type 2, Oral Hypoglycemic Agents and Insulin Dependent  Renal/GU CRFRenal disease  negative genitourinary   Musculoskeletal   Abdominal   Peds  Hematology  (+) anemia ,   Anesthesia Other Findings   Reproductive/Obstetrics negative OB ROS                          Anesthesia Physical Anesthesia Plan  ASA: III  Anesthesia Plan: General   Post-op Pain Management:    Induction: Intravenous  Airway Management Planned: LMA  Additional Equipment:   Intra-op Plan:   Post-operative Plan: Extubation in OR  Informed Consent: I have reviewed the patients History and Physical, chart, labs and discussed the procedure including the risks, benefits and alternatives for the proposed anesthesia with the patient or authorized representative who has indicated his/her understanding and acceptance.   Dental advisory given  Plan Discussed with: CRNA  Anesthesia Plan Comments:         Anesthesia Quick Evaluation

## 2012-08-18 NOTE — Anesthesia Procedure Notes (Signed)
Procedure Name: LMA Insertion Date/Time: 08/18/2012 8:35 AM Performed by: Jerilee Hoh Pre-anesthesia Checklist: Patient identified, Emergency Drugs available, Suction available and Patient being monitored Patient Re-evaluated:Patient Re-evaluated prior to inductionOxygen Delivery Method: Circle system utilized Preoxygenation: Pre-oxygenation with 100% oxygen Intubation Type: IV induction LMA: LMA inserted LMA Size: 4.0 Tube type: Oral Number of attempts: 1 Placement Confirmation: positive ETCO2 and breath sounds checked- equal and bilateral Tube secured with: Tape Dental Injury: Teeth and Oropharynx as per pre-operative assessment

## 2012-08-18 NOTE — Anesthesia Postprocedure Evaluation (Signed)
  Anesthesia Post-op Note  Patient: [name]  Procedure(s) Performed: Procedure(s) with comments: BASCILIC VEIN TRANSPOSITION (Left) - Left 2nd stage Basilic Vein Transposition   Patient Location: PACU  Anesthesia Type:General  Level of Consciousness: awake, alert  and oriented  Airway and Oxygen Therapy: Patient Spontanous Breathing and Patient connected to nasal cannula oxygen  Post-op Pain: none  Post-op Assessment: Post-op Vital signs reviewed, Patient's Cardiovascular Status Stable, Respiratory Function Stable, Patent Airway and No signs of Nausea or vomiting  Post-op Vital Signs: Reviewed and stable  Complications: No apparent anesthesia complications

## 2012-08-18 NOTE — H&P (View-Only) (Signed)
[facility] [place]  Postoperative Access Visit  History of Present Illness  [name] is a 79 y.o. year old female who presents for postoperative follow-up for: L 1st stage BVT (Date: 06/02/12).  The patient's wounds are healed.  The patient notes no steal symptoms.  The patient is able to complete their activities of daily living.  The patient's current symptoms are: none.  Past Medical History  Diagnosis Date  . Colon cancer   . Renal insufficiency, mild   . Anxiety   . Diabetes mellitus     diabetic retinopathy  . Anemia   . Aortic stenosis, mild   . Urinary tract infection   . Dysrhythmia     atrial fibrilation  . CHF (congestive heart failure)   . Hypertension     tracy turner  . Fall at home   . Colostomy care     colon ca    Past Surgical History  Procedure Laterality Date  . Abdominal surgery    . Abdominal hysterectomy    . Colon surgery    . Appendectomy    . Colostomy    . Cardioversion  09/30/2011    Procedure: CARDIOVERSION;  Surgeon: Traci R Turner, MD;  Location: [facility] OR;  Service: Cardiovascular;  Laterality: N/A;  . I&d extremity  11/20/2011    Procedure: IRRIGATION AND DEBRIDEMENT EXTREMITY;  Surgeon: John L Graves, MD;  Location: [facility] OR;  Service: Orthopedics;  Laterality: Left;  . Av fistula placement Left 06/02/2012    Procedure: ARTERIOVENOUS (AV) FISTULA CREATION;  Surgeon: Desani Sprung L Maronda Caison, MD;  Location: [facility] OR;  Service: Vascular;  Laterality: Left;  Bascilic Fistula    History   Social History  . Marital Status: Widowed    Spouse Name: N/A    Number of Children: N/A  . Years of Education: N/A   Occupational History  . Not on file.   Social History Main Topics  . Smoking status: Never Smoker   . Smokeless tobacco: Current User    Types: Snuff  . Alcohol Use: No  . Drug Use: No  . Sexually Active: No   Other Topics Concern  . Not on file   Social History Narrative  . No narrative on file    Family History   Problem Relation Age of Onset  . Heart disease Father   . Diabetes Sister   . Diabetes Daughter   . Hyperlipidemia Daughter   . Hypertension Daughter   . Other Daughter     varicose veins  . Cancer Son   . Diabetes Son   . Heart disease Son     before age 60  . Hyperlipidemia Son   . Hypertension Son     Current Outpatient Prescriptions on File Prior to Visit  Medication Sig Dispense Refill  . ALPRAZolam (XANAX) 0.5 MG tablet Take 0.25 mg by mouth 2 (two) times daily. For anxiety      . aspirin EC 81 MG tablet Take 81 mg by mouth daily.      . calcitRIOL (ROCALTROL) 0.25 MCG capsule Take 0.25 mcg by mouth 3 (three) times a week. Mon, Wed, Fri      . carvedilol (COREG) 12.5 MG tablet Take 12.5 mg by mouth 2 (two) times daily with a meal.      . CRANBERRY PO Take 1 capsule by mouth daily.      . diphenhydramine-acetaminophen (TYLENOL PM) 25-500 MG TABS Take 1-2 tablets by mouth at bedtime as   needed. For pain/sleep      . docusate sodium (COLACE) 100 MG capsule Take 100 mg by mouth daily as needed. For constipation      . furosemide (LASIX) 40 MG tablet Take 40 mg by mouth daily.       . insulin glargine (LANTUS) 100 UNIT/ML injection Inject 8 Units into the skin at bedtime as needed (if cbg over 150).       . linagliptin (TRADJENTA) 5 MG TABS tablet Take 5 mg by mouth every morning.      . meclizine (ANTIVERT) 12.5 MG tablet Take 12.5 mg by mouth 3 (three) times daily as needed for dizziness.       . nitrofurantoin, macrocrystal-monohydrate, (MACROBID) 100 MG capsule Take 100 mg by mouth daily. Indefinite course      . phenazopyridine (PYRIDIUM) 100 MG tablet Take 100 mg by mouth daily as needed for pain.      . traZODone (DESYREL) 50 MG tablet Take 50 mg by mouth at bedtime.      . vitamin B-12 (CYANOCOBALAMIN) 1000 MCG tablet Take 1,000 mcg by mouth daily.      . ciprofloxacin (CIPRO) 250 MG tablet       . mupirocin ointment (BACTROBAN) 2 %       . nitrofurantoin (MACRODANTIN)  100 MG capsule       . sulfamethoxazole-trimethoprim (BACTRIM DS) 800-160 MG per tablet        No current facility-administered medications on file prior to visit.    Allergies  Allergen Reactions  . Keflet (Cephalexin) Nausea Only  . Levaquin (Levofloxacin In D5w) Nausea Only  . Oxycodone Other (See Comments)    Abnormal behavior  . Codeine Nausea Only  . Morphine And Related Nausea Only    Review of Systems (Positive items checked otherwise negative)  General: [ ] Weight loss, [ ] Weight gain, [ ]  Loss of appetite, [ ] Fever  Neurologic: [ ] Dizziness, [ ] Blackouts, [ ] Headaches, [ ] Seizure  Ear/Nose/Throat: [ ] Change in eyesight, [ ] Change in hearing, [ ] Nose bleeds, [ ] Sore throat  Vascular: [ ] Pain in legs with walking, [ ] Pain in feet while lying flat, [ ] Non-healing ulcer, Stroke, [ ] "Mini stroke", [ ] Slurred speech, [ ] Temporary blindness, [ ] Blood clot in vein, [ ] Phlebitis  Pulmonary: [ ] Home oxygen, [ ] Productive cough, [ ] Bronchitis, [ ] Coughing up blood, [ ] Asthma, [ ] Wheezing  Musculoskeletal: [ ] Arthritis, [ ] Joint pain, [ ] Muscle pain  Cardiac: [ ] Chest pain, [ ] Chest tightness/pressure, [ ] Shortness of breath when lying flat, [ ] Shortness of breath with exertion, [ ] Palpitations, [ ] Heart murmur, [ ] Arrythmia, [ ] Atrial fibrillation  Hematologic: [ ] Bleeding problems, [ ] Clotting disorder, [ ] Anemia  Psychiatric:  [ ] Depression, [ ] Anxiety, [ ] Attention deficit disorder  Gastrointestinal:  [ ] Black stool,[ ]  Blood in stool, [ ] Peptic ulcer disease, [ ] Reflux, [ ] Hiatal hernia, [ ] Trouble swallowing, [ ] Diarrhea, [ ] Constipation  Urinary:  [x] Kidney disease, [ ] Burning with urination, [ ] Frequent urination, [ ] Difficulty urinating  Skin: [ ] Ulcers, [ ] Rashes    Physical Examination  Filed Vitals:   08/06/12 1524  BP: 151/72  Pulse: 47   PULM: CTAB  CV: RRR, nl S1, S2    LUE: Incision is healed,  skin feels warm, hand grip is 5/5, sensation in digits is intact, palpable thrill, bruit can be auscultated   L arm access duplex (Date: 08/06/12)  Fistula patent > 6 mm except distal segment (peri-anastomotic segment)  925 c/s at distal segment  Medical Decision Making  [name] is a 79 y.o. year old female who presents s/p L 1st stage BVT.  The patient is ready for 2nd stage, i.e. Transposition, which will be done on 28 JUN 14.  Revision of the anastomosis will be necessary, the main segment of the basilic vein looks to be 7 mm.  I suspect the L BVT current is being perfused via a cubital branch, which will be transected and tied off as part of the anastomotic revision.  Thank you for allowing us to participate in this patient's care.  Jerron Niblack, MD [facility] [place] Office: [phone] Pager: [phone]   

## 2012-08-19 ENCOUNTER — Encounter: Payer: Self-pay | Admitting: Vascular Surgery

## 2012-08-20 ENCOUNTER — Ambulatory Visit (INDEPENDENT_AMBULATORY_CARE_PROVIDER_SITE_OTHER): Payer: Medicare Other | Admitting: Vascular Surgery

## 2012-08-20 ENCOUNTER — Encounter (HOSPITAL_COMMUNITY): Payer: Self-pay | Admitting: Vascular Surgery

## 2012-08-20 VITALS — BP 172/98 | HR 88 | Temp 97.0°F | Resp 18 | Ht 63.0 in | Wt 182.0 lb

## 2012-08-20 DIAGNOSIS — N186 End stage renal disease: Secondary | ICD-10-CM

## 2012-08-20 DIAGNOSIS — Z48812 Encounter for surgical aftercare following surgery on the circulatory system: Secondary | ICD-10-CM | POA: Insufficient documentation

## 2012-08-20 NOTE — Progress Notes (Signed)
[facility] [place]  Postoperative Access Visit  History of Present Illness  [name] is a 79 y.o. year old female who presents for postoperative follow-up for: L 2nd stage BVT (Date: 08/18/12).  The patient's wounds are healing.  The patient notes no steal symptoms.  The patient is not able to complete their activities of daily living.  The patient's current symptoms are: mild pain in incision.   Physical Examination  LUE: Incision is c/d/i, only ~40 cc serosang fluid in JP bulb since OR, skin feels warm, hand grip is 5/5, sensation in digits is intact, palpable thrill, bruit can be auscultated , radial pulse easily palp.  Medical Decision Making  [name] is a 79 y.o. year old female who presents s/p L 2nd stage BVT.  JP can be removed today.  Pt will follow up in 4 weeks for wound check.  I suspect the access will be ready at that time.    Thank you for allowing [place] to participate in this patient's care.  Leonides Sake, MD [facility] [place] Office: [phone] Pager: [phone]

## 2012-09-03 ENCOUNTER — Emergency Department (HOSPITAL_COMMUNITY)
Admission: EM | Admit: 2012-09-03 | Discharge: 2012-09-03 | Disposition: A | Discharge status: Home / Self Care | Payer: Medicare Other | Attending: Emergency Medicine | Admitting: Emergency Medicine

## 2012-09-03 ENCOUNTER — Emergency Department (HOSPITAL_COMMUNITY): Payer: Medicare Other

## 2012-09-03 ENCOUNTER — Other Ambulatory Visit: Payer: Self-pay

## 2012-09-03 ENCOUNTER — Encounter (HOSPITAL_COMMUNITY): Payer: Self-pay | Admitting: *Deleted

## 2012-09-03 DIAGNOSIS — R5381 Other malaise: Secondary | ICD-10-CM | POA: Insufficient documentation

## 2012-09-03 DIAGNOSIS — I509 Heart failure, unspecified: Secondary | ICD-10-CM | POA: Insufficient documentation

## 2012-09-03 DIAGNOSIS — R531 Weakness: Secondary | ICD-10-CM

## 2012-09-03 DIAGNOSIS — Z79899 Other long term (current) drug therapy: Secondary | ICD-10-CM | POA: Insufficient documentation

## 2012-09-03 DIAGNOSIS — R110 Nausea: Secondary | ICD-10-CM | POA: Insufficient documentation

## 2012-09-03 DIAGNOSIS — I10 Essential (primary) hypertension: Secondary | ICD-10-CM | POA: Insufficient documentation

## 2012-09-03 DIAGNOSIS — R197 Diarrhea, unspecified: Secondary | ICD-10-CM | POA: Insufficient documentation

## 2012-09-03 DIAGNOSIS — D819 Combined immunodeficiency, unspecified: Secondary | ICD-10-CM | POA: Insufficient documentation

## 2012-09-03 DIAGNOSIS — Z87440 Personal history of urinary (tract) infections: Secondary | ICD-10-CM | POA: Insufficient documentation

## 2012-09-03 DIAGNOSIS — Z862 Personal history of diseases of the blood and blood-forming organs and certain disorders involving the immune mechanism: Secondary | ICD-10-CM | POA: Insufficient documentation

## 2012-09-03 DIAGNOSIS — F411 Generalized anxiety disorder: Secondary | ICD-10-CM | POA: Insufficient documentation

## 2012-09-03 DIAGNOSIS — Z85038 Personal history of other malignant neoplasm of large intestine: Secondary | ICD-10-CM | POA: Insufficient documentation

## 2012-09-03 DIAGNOSIS — Z87448 Personal history of other diseases of urinary system: Secondary | ICD-10-CM | POA: Insufficient documentation

## 2012-09-03 DIAGNOSIS — Z433 Encounter for attention to colostomy: Secondary | ICD-10-CM | POA: Insufficient documentation

## 2012-09-03 DIAGNOSIS — Z8679 Personal history of other diseases of the circulatory system: Secondary | ICD-10-CM | POA: Insufficient documentation

## 2012-09-03 DIAGNOSIS — R51 Headache: Secondary | ICD-10-CM | POA: Insufficient documentation

## 2012-09-03 DIAGNOSIS — R5383 Other fatigue: Secondary | ICD-10-CM | POA: Insufficient documentation

## 2012-09-03 DIAGNOSIS — Z7982 Long term (current) use of aspirin: Secondary | ICD-10-CM | POA: Insufficient documentation

## 2012-09-03 DIAGNOSIS — Z794 Long term (current) use of insulin: Secondary | ICD-10-CM | POA: Insufficient documentation

## 2012-09-03 LAB — CBC WITH DIFFERENTIAL/PLATELET
Basophils Absolute: 0 10*3/uL (ref 0.0–0.1)
Basophils Relative: 0 % (ref 0–1)
Eosinophils Absolute: 0 10*3/uL (ref 0.0–0.7)
Eosinophils Relative: 0 % (ref 0–5)
MCH: 29.8 pg (ref 26.0–34.0)
MCHC: 32.5 g/dL (ref 30.0–36.0)
MCV: 91.7 fL (ref 78.0–100.0)
Neutrophils Relative %: 75 % (ref 43–77)
Platelets: 172 10*3/uL (ref 150–400)
RDW: 13.5 % (ref 11.5–15.5)

## 2012-09-03 LAB — URINALYSIS, ROUTINE W REFLEX MICROSCOPIC
Hgb urine dipstick: NEGATIVE
Nitrite: POSITIVE — AB
Specific Gravity, Urine: 1.007 (ref 1.005–1.030)
Urobilinogen, UA: 0.2 mg/dL (ref 0.0–1.0)
pH: 6 (ref 5.0–8.0)

## 2012-09-03 LAB — URINE MICROSCOPIC-ADD ON

## 2012-09-03 LAB — COMPREHENSIVE METABOLIC PANEL
ALT: <5 U/L (ref 0–35)
AST: 12 U/L (ref 0–37)
Albumin: 3.8 g/dL (ref 3.5–5.2)
Calcium: 9.5 mg/dL (ref 8.4–10.5)
GFR calc Af Amer: 18 mL/min — ABNORMAL LOW (ref >90)
Glucose, Bld: 173 mg/dL — ABNORMAL HIGH (ref 70–99)
Potassium: 3.9 mEq/L (ref 3.5–5.1)
Sodium: 138 mEq/L (ref 135–145)
Total Protein: 7 g/dL (ref 6.0–8.3)

## 2012-09-03 LAB — POCT I-STAT TROPONIN I

## 2012-09-03 MED ORDER — ALUM & MAG HYDROXIDE-SIMETH 200-200-20 MG/5ML PO SUSP
15.0000 mL | Freq: Once | ORAL | Status: AC
  Administered 2012-09-03: 15 mL via ORAL
  Filled 2012-09-03: qty 30

## 2012-09-03 NOTE — ED Notes (Signed)
Per pt's daughter pt has been not feeling well x2 wks. Pt states she has nausea, no vomiting, and fatigue. Pt states she had some diarrhea but only one day and has not had anymore. Pt's daughter also states she is coughing up "stuff" in the mornings. Pt alert and oriented.

## 2012-09-03 NOTE — ED Provider Notes (Signed)
History     CSN: [id number]  Arrival date & time 09/03/12  1440   First MD Initiated Contact with Patient 09/03/12 1722      Chief Complaint  Patient presents with  . Nausea  . Fatigue    (Consider location/radiation/quality/duration/timing/severity/associated sxs/prior treatment) HPI Complains of diminished appetite nausea for posterior one week. Symptoms are covered 1 mild headache no chest pain no shortness of breath nothing makes symptoms better or worse she feels improved presently. She denies lightheadedness with standing. No treatment prior to coming here. Nothing makes symptoms better or worse. Presently only complains of mild headache. She had one episode of diarrhea into her colostomy after eating a lot of fruit several days ago. Note diarrhea since. Had slight bowel movement today, normal. Past Medical History  Diagnosis Date  . Renal insufficiency, mild   . Anxiety   . Diabetes mellitus     diabetic retinopathy  . Anemia   . Aortic stenosis, mild   . Urinary tract infection   . Dysrhythmia     atrial fibrilation  . CHF (congestive heart failure)   . Hypertension     tracy turner  . Fall at home   . Colostomy care     colon ca  . Colon cancer 30 year ago    Past Surgical History  Procedure Laterality Date  . Abdominal surgery    . Abdominal hysterectomy    . Colon surgery    . Appendectomy    . Colostomy    . Cardioversion  09/30/2011    Procedure: CARDIOVERSION;  Surgeon: Quintella Reichert, MD;  Location: [facility] OR;  Service: Cardiovascular;  Laterality: N/A;  . I&d extremity  11/20/2011    Procedure: IRRIGATION AND DEBRIDEMENT EXTREMITY;  Surgeon: Harvie Junior, MD;  Location: [facility] OR;  Service: Orthopedics;  Laterality: Left;  . Av fistula placement Left 06/02/2012    Procedure: ARTERIOVENOUS (AV) FISTULA CREATION;  Surgeon: Fransisco Hertz, MD;  Location: [facility] OR;  Service: Vascular;  Laterality: Left;  Bascilic Fistula  . Bascilic vein transposition Left 08/18/2012   Procedure: BASCILIC VEIN TRANSPOSITION;  Surgeon: Fransisco Hertz, MD;  Location: [facility] OR;  Service: Vascular;  Laterality: Left;  Left 2nd stage Basilic Vein Transposition     Family History  Problem Relation Age of Onset  . Heart disease Father   . Diabetes Sister   . Diabetes Daughter   . Hyperlipidemia Daughter   . Hypertension Daughter   . Other Daughter     varicose veins  . Cancer Son   . Diabetes Son   . Heart disease Son     before age 9  . Hyperlipidemia Son   . Hypertension Son     History  Substance Use Topics  . Smoking status: Never Smoker   . Smokeless tobacco: Current User    Types: Snuff  . Alcohol Use: No    OB History   Grav Para Term Preterm Abortions TAB SAB Ect Mult Living                  Review of Systems  Gastrointestinal: Positive for nausea.  Allergic/Immunologic: Positive for immunocompromised state.  Neurological: Positive for weakness and headaches.    Allergies  Keflet; Levaquin; Oxycodone; Codeine; and Morphine and related  Home Medications   Current Outpatient Rx  Name  Route  Sig  Dispense  Refill  . acetaminophen (TYLENOL) 500 MG tablet   Oral   Take 500 mg by mouth  every 6 (six) hours as needed for pain.         Marland Kitchen aspirin EC 81 MG tablet   Oral   Take 81 mg by mouth daily.         . calcitRIOL (ROCALTROL) 0.25 MCG capsule   Oral   Take 0.25 mcg by mouth every Monday, Wednesday, and Friday.          . carvedilol (COREG) 12.5 MG tablet   Oral   Take 12.5 mg by mouth 2 (two) times daily with a meal.         . cyclobenzaprine (FLEXERIL) 10 MG tablet   Oral   Take 10 mg by mouth at bedtime.         . docusate sodium (COLACE) 100 MG capsule   Oral   Take 100 mg by mouth daily as needed for constipation.          . furosemide (LASIX) 40 MG tablet   Oral   Take 40 mg by mouth daily.          . insulin glargine (LANTUS) 100 UNIT/ML injection   Subcutaneous   Inject 8 Units into the skin at bedtime as  needed (if cbg over 150).          Marland Kitchen linagliptin (TRADJENTA) 5 MG TABS tablet   Oral   Take 5 mg by mouth every morning.         Marland Kitchen LORazepam (ATIVAN) 0.5 MG tablet   Oral   Take 0.5 mg by mouth 2 (two) times daily.         . meclizine (ANTIVERT) 12.5 MG tablet   Oral   Take 12.5 mg by mouth 3 (three) times daily as needed for dizziness.          . phenazopyridine (PYRIDIUM) 100 MG tablet   Oral   Take 100 mg by mouth daily as needed for pain.         . vitamin B-12 (CYANOCOBALAMIN) 1000 MCG tablet   Oral   Take 1,000 mcg by mouth daily.           BP 161/81  Pulse 87  Temp(Src) 98.1 F (36.7 C) (Oral)  Resp 20  SpO2 95%  Physical Exam  Nursing note and vitals reviewed. Constitutional: She appears well-developed and well-nourished.  HENT:  Head: Normocephalic and atraumatic.  Eyes: Conjunctivae are normal. Pupils are equal, round, and reactive to light.  Neck: Neck supple. No tracheal deviation present. No thyromegaly present.  Cardiovascular: Normal rate and regular rhythm.   No murmur heard. Pulmonary/Chest: Effort normal and breath sounds normal.  Abdominal: Soft. Bowel sounds are normal. She exhibits no distension. There is no tenderness.  Colostomy in place  Musculoskeletal: Normal range of motion. She exhibits no edema and no tenderness.  Neurological: She is alert. Coordination normal.  Skin: Skin is warm and dry. No rash noted.  Psychiatric: She has a normal mood and affect.    ED Course  Procedures (including critical care time)  Labs Reviewed  CBC WITH DIFFERENTIAL - Abnormal; Notable for the following:    Hemoglobin 11.8 (*)    All other components within normal limits  COMPREHENSIVE METABOLIC PANEL - Abnormal; Notable for the following:    Glucose, Bld 173 (*)    BUN 33 (*)    Creatinine, Ser 2.68 (*)    GFR calc non Af Amer 15 (*)    GFR calc Af Amer 18 (*)    All other  components within normal limits  URINALYSIS, ROUTINE W REFLEX  MICROSCOPIC - Abnormal; Notable for the following:    Color, Urine AMBER (*)    Nitrite POSITIVE (*)    Leukocytes, UA TRACE (*)    All other components within normal limits  URINE MICROSCOPIC-ADD ON - Abnormal; Notable for the following:    Squamous Epithelial / LPF FEW (*)    All other components within normal limits  URINE CULTURE  LIPASE, BLOOD   No results found.   No diagnosis found.    Date: 09/03/2012  Rate: 85  Rhythm: normal sinus rhythm  QRS Axis: right  Intervals: normal  ST/T Wave abnormalities: nonspecific T wave changes  Conduction Disutrbances:none  Narrative Interpretation:   Old EKG Reviewed: No significant change from 3/23 2014 interpreted by me 8 50 p.m. feels improved after treatment with Maalox. She ate crackers and drink water without difficulty. She feels well to go home. Results for orders placed during the hospital encounter of 09/03/12  CBC WITH DIFFERENTIAL      Result Value Range   WBC 10.1  4.0 - 10.5 K/uL   RBC 3.96  3.87 - 5.11 MIL/uL   Hemoglobin 11.8 (*) 12.0 - 15.0 g/dL   HCT 40.9  81.1 - 91.4 %   MCV 91.7  78.0 - 100.0 fL   MCH 29.8  26.0 - 34.0 pg   MCHC 32.5  30.0 - 36.0 g/dL   RDW 78.2  95.6 - 21.3 %   Platelets 172  150 - 400 K/uL   Neutrophils Relative % 75  43 - 77 %   Neutro Abs 7.6  1.7 - 7.7 K/uL   Lymphocytes Relative 18  12 - 46 %   Lymphs Abs 1.8  0.7 - 4.0 K/uL   Monocytes Relative 7  3 - 12 %   Monocytes Absolute 0.7  0.1 - 1.0 K/uL   Eosinophils Relative 0  0 - 5 %   Eosinophils Absolute 0.0  0.0 - 0.7 K/uL   Basophils Relative 0  0 - 1 %   Basophils Absolute 0.0  0.0 - 0.1 K/uL  COMPREHENSIVE METABOLIC PANEL      Result Value Range   Sodium 138  135 - 145 mEq/L   Potassium 3.9  3.5 - 5.1 mEq/L   Chloride 99  96 - 112 mEq/L   CO2 27  19 - 32 mEq/L   Glucose, Bld 173 (*) 70 - 99 mg/dL   BUN 33 (*) 6 - 23 mg/dL   Creatinine, Ser 0.86 (*) 0.50 - 1.10 mg/dL   Calcium 9.5  8.4 - 57.8 mg/dL   Total Protein 7.0   6.0 - 8.3 g/dL   Albumin 3.8  3.5 - 5.2 g/dL   AST 12  0 - 37 U/L   ALT <5  0 - 35 U/L   Alkaline Phosphatase 98  39 - 117 U/L   Total Bilirubin 0.5  0.3 - 1.2 mg/dL   GFR calc non Af Amer 15 (*) >90 mL/min   GFR calc Af Amer 18 (*) >90 mL/min  LIPASE, BLOOD      Result Value Range   Lipase 57  11 - 59 U/L  URINALYSIS, ROUTINE W REFLEX MICROSCOPIC      Result Value Range   Color, Urine AMBER (*) YELLOW   APPearance CLEAR  CLEAR   Specific Gravity, Urine 1.007  1.005 - 1.030   pH 6.0  5.0 - 8.0   Glucose, UA NEGATIVE  NEGATIVE mg/dL   Hgb urine dipstick NEGATIVE  NEGATIVE   Bilirubin Urine NEGATIVE  NEGATIVE   Ketones, ur NEGATIVE  NEGATIVE mg/dL   Protein, ur NEGATIVE  NEGATIVE mg/dL   Urobilinogen, UA 0.2  0.0 - 1.0 mg/dL   Nitrite POSITIVE (*) NEGATIVE   Leukocytes, UA TRACE (*) NEGATIVE  URINE MICROSCOPIC-ADD ON      Result Value Range   Squamous Epithelial / LPF FEW (*) RARE   WBC, UA 3-6  <3 WBC/hpf   RBC / HPF 0-2  <3 RBC/hpf   Bacteria, UA RARE  RARE  POCT I-STAT TROPONIN I      Result Value Range   Troponin i, poc 0.00  0.00 - 0.08 ng/mL   Comment 3            Dg Abd Acute W/chest  09/03/2012   *RADIOLOGY REPORT*  Clinical Data: Nausea.  Anorexia.  Fatigue.  Colon carcinoma. Congestive heart failure.  ACUTE ABDOMEN SERIES (ABDOMEN 2 VIEW & CHEST 1 VIEW)  Comparison: Chest radiograph on 05/18/2012  Findings: There is no evidence of dilated bowel loops.  Moderate stool burden noted.  Surgical clips noted in the left abdomen with left-sided abdominal wall hernia.  No evidence of free air.  Left basilar scarring is stable.  No evidence of acute infiltrate or pulmonary edema.  Heart size is stable and within normal limits.  IMPRESSION:  1.  No acute findings. 2.  Left lateral abdominal wall hernia. 3.  Moderate stool burden.   Original Report Authenticated By: Myles Rosenthal, M.D.    MDM  Doubt acute coronary syndrome based on symptoms lasting one week. Nonacute EKG. Negative  troponin. Plan urine for culture. Patient to followup with Dr. Marc Morgans next week. Return feels worse. Diagnosis #1 nonspecific weakness #2 hyperglycemia #3 hypertension        Doug Sou, MD 09/03/12 2109

## 2012-09-03 NOTE — ED Notes (Signed)
Discharge instructions reviewed with pt and family. Pt verbalized understanding.  

## 2012-09-03 NOTE — ED Notes (Signed)
Pt reports having nausea and unable to eat x 1 week, generalized fatigue and weakness. Denies diarrhea. Pt thinks she may have a UTI but denies any pain or frequency with urination.

## 2012-09-07 LAB — URINE CULTURE

## 2012-09-08 NOTE — Progress Notes (Signed)
ED Antimicrobial Stewardship Positive Culture Follow Up   [name] is an 79 y.o. female who presented to [facility] on 09/03/2012 with a chief complaint of nausea/fatigue  Chief Complaint  Patient presents with  . Nausea  . Fatigue    Recent Results (from the past 720 hour(s))  SURGICAL PCR SCREEN     Status: None   Collection Time    08/13/12  8:38 AM      Result Value Range Status   MRSA, PCR NEGATIVE  NEGATIVE Final   Staphylococcus aureus NEGATIVE  NEGATIVE Final   Comment:            The Xpert SA Assay (FDA     approved for NASAL specimens     in patients over 2 years of age),     is one component of     a comprehensive surveillance     program.  Test performance has     been validated by [employer] for patients greater     than or equal to 75 year old.     It is not intended     to diagnose infection nor to     guide or monitor treatment.  URINE CULTURE     Status: None   Collection Time    09/03/12  3:08 PM      Result Value Range Status   Specimen Description URINE, RANDOM   Final   Special Requests Immunocompromised   Final   Culture  Setup Time 09/03/2012 19:49   Final   Colony Count >=100,000 COLONIES/ML   Final   Culture     Final   Value: ESCHERICHIA COLI     KLEBSIELLA OXYTOCA   Report Status 09/07/2012 FINAL   Final   Organism ID, Bacteria ESCHERICHIA COLI   Final   Organism ID, Bacteria KLEBSIELLA OXYTOCA   Final    []  Treated with , organism resistant to prescribed antimicrobial [x]  Patient discharged originally without antimicrobial agent and treatment is now indicated  The patient has an allergy listed to Keflex as "nausea" however Keflex is one of limited options we have to treat both the E. Coli and Klebsiella. Per discussion with the ED PA -- can take Zofran prior to Keflex to help with any nausea.   New antibiotic prescription: Keflex 500 mg twice daily for 10 days & Zofran 4 mg every 8 hours prn nausea (may take one prior to taking  antibiotic to help with nausea)  ED Provider: Antony Madura, PA-C  Rolley Sims 09/08/2012, 10:12 AM Infectious Diseases Pharmacist Phone# [phone]

## 2012-09-08 NOTE — ED Notes (Signed)
Post ED Visit - Positive Culture Follow-up: Successful Patient Follow-Up  Culture assessed and recommendations reviewed by: []  Wes Dulaney, Pharm.D., BCPS []  Celedonio Miyamoto, Pharm.D., BCPS [x]  Georgina Pillion, Pharm.D., BCPS []  [place], [place].D., BCPS, AAHIVP []  Estella Husk, Pharm.D., BCPS, AAHIVP  Positive urine culture  [x]  Patient discharged without antimicrobial prescription and treatment is now indicated []  Organism is resistant to prescribed ED discharge antimicrobial []  Patient with positive blood cultures  Changes discussed with ED provider:KellyHumes New antibiotic prescription Zofran 40 mg every 8 hours as needed for nausea/ Keflex 500 mg twice daily x 10 days   Contacted patient: Patient contacted ,request that lab report be sent to Dr Foy Guadalajara for f/u , date 09/08/2012 time 12:35  Larena Sox 09/08/2012, 11:46 AM

## 2012-09-21 ENCOUNTER — Other Ambulatory Visit: Payer: Self-pay | Admitting: *Deleted

## 2012-09-21 DIAGNOSIS — N186 End stage renal disease: Secondary | ICD-10-CM

## 2012-09-21 DIAGNOSIS — Z4931 Encounter for adequacy testing for hemodialysis: Secondary | ICD-10-CM

## 2012-09-30 ENCOUNTER — Encounter: Payer: Self-pay | Admitting: Vascular Surgery

## 2012-10-01 ENCOUNTER — Encounter: Payer: Self-pay | Admitting: Vascular Surgery

## 2012-10-01 ENCOUNTER — Encounter (INDEPENDENT_AMBULATORY_CARE_PROVIDER_SITE_OTHER): Payer: Medicare Other | Admitting: *Deleted

## 2012-10-01 ENCOUNTER — Ambulatory Visit (INDEPENDENT_AMBULATORY_CARE_PROVIDER_SITE_OTHER): Payer: Medicare Other | Admitting: Vascular Surgery

## 2012-10-01 VITALS — BP 171/58 | HR 80 | Ht 63.0 in | Wt 181.7 lb

## 2012-10-01 DIAGNOSIS — T82598A Other mechanical complication of other cardiac and vascular devices and implants, initial encounter: Secondary | ICD-10-CM

## 2012-10-01 DIAGNOSIS — N186 End stage renal disease: Secondary | ICD-10-CM

## 2012-10-01 DIAGNOSIS — Z4931 Encounter for adequacy testing for hemodialysis: Secondary | ICD-10-CM

## 2012-10-01 NOTE — Progress Notes (Signed)
[facility] [place]  Postoperative Access Visit  History of Present Illness  [name] is a 79 y.o. year old female who presents for postoperative follow-up for: L 2nd stage BVT (Date: 08/18/12).  Pt also recently underwent a L elbow procedure.  The patient's wounds are healed.  The patient notes no steal symptoms.  The patient is able to complete their activities of daily living.  The patient's current symptoms are: positional numbness in lower arm.  For VQI Use Only  PRE-ADM LIVING: Home  AMB STATUS: Ambulatory  Physical Examination Filed Vitals:   10/01/12 1144  BP: 171/58  Pulse: 80    LUE: Incision is healed, skin feels warm, hand grip is 5/5, sensation in digits is intact, palpable strong thrill, bruit can be easily auscultated   Access duplex (10/01/12)  Diameter 6.2-7.7 mm in diameter except near anastomosis 2.8 mm  PSV >800 c/s near anastomosis  Medical Decision Making  [name] is a 79 y.o. year old female who presents s/p L 2nd stage BVT.  There appears to be some tapering near the anastomosis, but I suspect this should help prevent development of any steal in this patient.  I would not immediately intervene given the disparity between the study and the clinical exam  The patient's access is ready for use.  The patient's tunneled dialysis catheter can be removed after two successful cannulations and completed dialysis treatments.  Thank you for allowing [place] to participate in this patient's care.  Leonides Sake, MD [facility] [place] Office: [phone] Pager: [phone]  10/01/2012, 12:52 PM

## 2012-10-27 ENCOUNTER — Other Ambulatory Visit: Payer: Self-pay

## 2012-12-28 ENCOUNTER — Encounter (HOSPITAL_COMMUNITY): Payer: Self-pay | Admitting: *Deleted

## 2012-12-28 ENCOUNTER — Emergency Department (HOSPITAL_COMMUNITY)
Admission: EM | Admit: 2012-12-28 | Discharge: 2012-12-29 | Disposition: A | Discharge status: Home / Self Care | Payer: Medicare Other | Attending: Emergency Medicine | Admitting: Emergency Medicine

## 2012-12-28 DIAGNOSIS — N184 Chronic kidney disease, stage 4 (severe): Secondary | ICD-10-CM | POA: Insufficient documentation

## 2012-12-28 DIAGNOSIS — E119 Type 2 diabetes mellitus without complications: Secondary | ICD-10-CM

## 2012-12-28 DIAGNOSIS — Z9181 History of falling: Secondary | ICD-10-CM | POA: Insufficient documentation

## 2012-12-28 DIAGNOSIS — I509 Heart failure, unspecified: Secondary | ICD-10-CM | POA: Insufficient documentation

## 2012-12-28 DIAGNOSIS — I129 Hypertensive chronic kidney disease with stage 1 through stage 4 chronic kidney disease, or unspecified chronic kidney disease: Secondary | ICD-10-CM | POA: Insufficient documentation

## 2012-12-28 DIAGNOSIS — Z79899 Other long term (current) drug therapy: Secondary | ICD-10-CM | POA: Insufficient documentation

## 2012-12-28 DIAGNOSIS — Z7982 Long term (current) use of aspirin: Secondary | ICD-10-CM | POA: Insufficient documentation

## 2012-12-28 DIAGNOSIS — Z85038 Personal history of other malignant neoplasm of large intestine: Secondary | ICD-10-CM | POA: Insufficient documentation

## 2012-12-28 DIAGNOSIS — Z9889 Other specified postprocedural states: Secondary | ICD-10-CM | POA: Insufficient documentation

## 2012-12-28 DIAGNOSIS — Z794 Long term (current) use of insulin: Secondary | ICD-10-CM | POA: Insufficient documentation

## 2012-12-28 DIAGNOSIS — E11319 Type 2 diabetes mellitus with unspecified diabetic retinopathy without macular edema: Secondary | ICD-10-CM | POA: Insufficient documentation

## 2012-12-28 DIAGNOSIS — N189 Chronic kidney disease, unspecified: Secondary | ICD-10-CM

## 2012-12-28 DIAGNOSIS — Z87440 Personal history of urinary (tract) infections: Secondary | ICD-10-CM | POA: Insufficient documentation

## 2012-12-28 DIAGNOSIS — Z90710 Acquired absence of both cervix and uterus: Secondary | ICD-10-CM | POA: Insufficient documentation

## 2012-12-28 DIAGNOSIS — F411 Generalized anxiety disorder: Secondary | ICD-10-CM | POA: Insufficient documentation

## 2012-12-28 DIAGNOSIS — K922 Gastrointestinal hemorrhage, unspecified: Secondary | ICD-10-CM | POA: Insufficient documentation

## 2012-12-28 DIAGNOSIS — E1139 Type 2 diabetes mellitus with other diabetic ophthalmic complication: Secondary | ICD-10-CM | POA: Insufficient documentation

## 2012-12-28 DIAGNOSIS — Z862 Personal history of diseases of the blood and blood-forming organs and certain disorders involving the immune mechanism: Secondary | ICD-10-CM | POA: Insufficient documentation

## 2012-12-28 LAB — COMPREHENSIVE METABOLIC PANEL
ALT: 7 U/L (ref 0–35)
AST: 17 U/L (ref 0–37)
Calcium: 9 mg/dL (ref 8.4–10.5)
Sodium: 141 mEq/L (ref 135–145)
Total Protein: 6.7 g/dL (ref 6.0–8.3)

## 2012-12-28 LAB — PROTIME-INR: INR: 1.16 (ref 0.00–1.49)

## 2012-12-28 LAB — CBC WITH DIFFERENTIAL/PLATELET
Basophils Relative: 0 % (ref 0–1)
Eosinophils Absolute: 0 10*3/uL (ref 0.0–0.7)
HCT: 34.2 % — ABNORMAL LOW (ref 36.0–46.0)
Hemoglobin: 11.2 g/dL — ABNORMAL LOW (ref 12.0–15.0)
MCH: 30.6 pg (ref 26.0–34.0)
MCHC: 32.7 g/dL (ref 30.0–36.0)
Monocytes Absolute: 0.8 10*3/uL (ref 0.1–1.0)
Monocytes Relative: 8 % (ref 3–12)
Neutro Abs: 7.3 10*3/uL (ref 1.7–7.7)

## 2012-12-28 NOTE — ED Notes (Addendum)
Noticed blood in colostomy stool (described as "bloody water"), also mentions low back pain. Stomach was upset last night (not now). (denies: nv, fever, sob, dizziness or abd pain). Seen by PCP (Dr. Foy Guadalajara, [facility]) yesterday, urine sent off for UTI assessment. Here with family, sitting in w/c, alert, NAD, calm, interactive. L arm AV fistula present, not on HD.

## 2012-12-28 NOTE — ED Provider Notes (Addendum)
CSN: [id number]     Arrival date & time 12/28/12  2024 History   First MD Initiated Contact with Patient 12/28/12 2312     Chief Complaint  Patient presents with  . GI Bleeding   (Consider location/radiation/quality/duration/timing/severity/associated sxs/prior Treatment) HPI Patient is a very pleasant 79 yo insulin dependent diabetic woman with CKD stage IV, history of CHF, AF, HTN, gait instability and falls, aortic stenosis.   She has a history of colon cancer with partial colectomy and colostomy. She presents with report of thin, pinkish bloody appearing discharge into the ostomy. She noticed this at 10:30am today for the first time. Has emptied bag frequently but says that total output has been < 1/2 cup. She says she had some dyspepsia last night after eating a pickle and a piece of cake last night but, that resolved and has not recurred.   She denies N/V. Only antiplatelet is ASA 81mg  po. No anticoagulants. Last colonoscopy was approximately 2 years ago and was performed at [facility]. Patient can't recall the name of her GI specialist.   Patient denies any feeling of lightheadedness, near syncope, any change in mild, baseline, generalized weakness. Skin color looks unchanged, per daughters. Patient denies history of of blood output, hematochezia or melena into ostomy.   Patient's dtr notes that she "keeps a UTI" and was seen by Dr. Zachery Conch yesterday and diagnosed with a UTI.   Past Medical History  Diagnosis Date  . Renal insufficiency, mild   . Anxiety   . Diabetes mellitus     diabetic retinopathy  . Anemia   . Aortic stenosis, mild   . Urinary tract infection   . Dysrhythmia     atrial fibrilation  . CHF (congestive heart failure)   . Hypertension     tracy turner  . Fall at home   . Colostomy care     colon ca  . Colon cancer 30 year ago   Past Surgical History  Procedure Laterality Date  . Abdominal surgery    . Abdominal hysterectomy    . Colon surgery    .  Appendectomy    . Colostomy    . Cardioversion  09/30/2011    Procedure: CARDIOVERSION;  Surgeon: Quintella Reichert, MD;  Location: [facility] OR;  Service: Cardiovascular;  Laterality: N/A;  . I&d extremity  11/20/2011    Procedure: IRRIGATION AND DEBRIDEMENT EXTREMITY;  Surgeon: Harvie Junior, MD;  Location: [facility] OR;  Service: Orthopedics;  Laterality: Left;  . Av fistula placement Left 06/02/2012    Procedure: ARTERIOVENOUS (AV) FISTULA CREATION;  Surgeon: Fransisco Hertz, MD;  Location: [facility] OR;  Service: Vascular;  Laterality: Left;  Bascilic Fistula  . Bascilic vein transposition Left 08/18/2012    Procedure: BASCILIC VEIN TRANSPOSITION;  Surgeon: Fransisco Hertz, MD;  Location: [facility] OR;  Service: Vascular;  Laterality: Left;  Left 2nd stage Basilic Vein Transposition    Family History  Problem Relation Age of Onset  . Heart disease Father   . Diabetes Sister   . Diabetes Daughter   . Hyperlipidemia Daughter   . Hypertension Daughter   . Other Daughter     varicose veins  . Cancer Son   . Diabetes Son   . Heart disease Son     before age 20  . Hyperlipidemia Son   . Hypertension Son    History  Substance Use Topics  . Smoking status: Never Smoker   . Smokeless tobacco: Current User    Types:  Snuff  . Alcohol Use: No   OB History   Grav Para Term Preterm Abortions TAB SAB Ect Mult Living                 Review of Systems 10 point ROS performed. Notable for chronic and generalized mild weakness as well as sx reported above. Otherwise negative.   Allergies  Keflet; Levaquin; Oxycodone; Codeine; and Morphine and related  Home Medications   Current Outpatient Rx  Name  Route  Sig  Dispense  Refill  . acetaminophen (TYLENOL) 500 MG tablet   Oral   Take 1,000 mg by mouth every 6 (six) hours as needed for pain.          Marland Kitchen aspirin EC 81 MG tablet   Oral   Take 81 mg by mouth daily.         . calcitRIOL (ROCALTROL) 0.25 MCG capsule   Oral   Take 0.25 mcg by mouth every Monday,  Wednesday, and Friday.          . carvedilol (COREG) 25 MG tablet   Oral   Take 25 mg by mouth 2 (two) times daily with a meal.         . docusate sodium (COLACE) 100 MG capsule   Oral   Take 100 mg by mouth daily as needed for constipation.          . furosemide (LASIX) 80 MG tablet   Oral   Take 80 mg by mouth daily.         . insulin glargine (LANTUS) 100 UNIT/ML injection   Subcutaneous   Inject 8 Units into the skin at bedtime as needed (if cbg over 150).          Marland Kitchen linagliptin (TRADJENTA) 5 MG TABS tablet   Oral   Take 5 mg by mouth daily.          Marland Kitchen LORazepam (ATIVAN) 0.5 MG tablet   Oral   Take 0.5 mg by mouth 2 (two) times daily.         . mirtazapine (REMERON) 7.5 MG tablet   Oral   Take 7.5 mg by mouth at bedtime.          . vitamin B-12 (CYANOCOBALAMIN) 1000 MCG tablet   Oral   Take 1,000 mcg by mouth daily.          BP 154/69  Pulse 80  Temp(Src) 98.6 F (37 C) (Oral)  Resp 20  SpO2 96% Physical Exam Gen: well developed and well nourished appearing, appears obese Head: NCAT Eyes: PERL, EOMI Nose: no epistaixis or rhinorrhea Mouth/throat: mucosa is moist and pink Neck: supple, no stridor Lungs: CTA B, no wheezing, rhonchi or rales CV: irreg irreg, holosystolic murmur, ext well perfused Abd: soft, obese, notender, nondistended, LLQ ostomy with watery like but pinkish output of approximately 10mL Back: no ttp, no cva ttp Skin: no rashese, wnl Neuro: CN ii-xii grossly intact, no focal deficits Psyche; normal affect,  calm and cooperative.    ED Course  Procedures (including critical care time) Labs Review  Results for orders placed during the hospital encounter of 12/28/12 (from the past 24 hour(s))  COMPREHENSIVE METABOLIC PANEL     Status: Abnormal   Collection Time    12/28/12  8:55 PM      Result Value Range   Sodium 141  135 - 145 mEq/L   Potassium 3.6  3.5 - 5.1 mEq/L   Chloride 101  96 -  112 mEq/L   CO2 27  19 - 32  mEq/L   Glucose, Bld 132 (*) 70 - 99 mg/dL   BUN 47 (*) 6 - 23 mg/dL   Creatinine, Ser 4.54 (*) 0.50 - 1.10 mg/dL   Calcium 9.0  8.4 - 09.8 mg/dL   Total Protein 6.7  6.0 - 8.3 g/dL   Albumin 3.5  3.5 - 5.2 g/dL   AST 17  0 - 37 U/L   ALT 7  0 - 35 U/L   Alkaline Phosphatase 124 (*) 39 - 117 U/L   Total Bilirubin 0.3  0.3 - 1.2 mg/dL   GFR calc non Af Amer 16 (*) >90 mL/min   GFR calc Af Amer 19 (*) >90 mL/min  CBC WITH DIFFERENTIAL     Status: Abnormal   Collection Time    12/28/12  8:55 PM      Result Value Range   WBC 9.8  4.0 - 10.5 K/uL   RBC 3.66 (*) 3.87 - 5.11 MIL/uL   Hemoglobin 11.2 (*) 12.0 - 15.0 g/dL   HCT 11.9 (*) 14.7 - 82.9 %   MCV 93.4  78.0 - 100.0 fL   MCH 30.6  26.0 - 34.0 pg   MCHC 32.7  30.0 - 36.0 g/dL   RDW 56.2  13.0 - 86.5 %   Platelets 142 (*) 150 - 400 K/uL   Neutrophils Relative % 75  43 - 77 %   Neutro Abs 7.3  1.7 - 7.7 K/uL   Lymphocytes Relative 17  12 - 46 %   Lymphs Abs 1.7  0.7 - 4.0 K/uL   Monocytes Relative 8  3 - 12 %   Monocytes Absolute 0.8  0.1 - 1.0 K/uL   Eosinophils Relative 0  0 - 5 %   Eosinophils Absolute 0.0  0.0 - 0.7 K/uL   Basophils Relative 0  0 - 1 %   Basophils Absolute 0.0  0.0 - 0.1 K/uL    EKG: AF, no acute ischemic changes, normal intervals, normal axis, normal qrs complex  MDM   Patient with scant amount of very diluted bloody output from ostomy. VS are wnl. Patient is asymptomatic. H/H stable. Patient  Is stable for d/c with plan to f/u with her gastroenterologist at [facility] in the next 1-2 days. Advised to return to the emergency department for redness like symptoms.   Brandt Loosen, MD 12/28/12 7846  Brandt Loosen, MD 12/29/12 [phone]

## 2012-12-29 LAB — OCCULT BLOOD, POC DEVICE: Fecal Occult Bld: POSITIVE — AB

## 2013-01-27 ENCOUNTER — Other Ambulatory Visit: Payer: Self-pay

## 2013-02-23 ENCOUNTER — Encounter: Payer: Self-pay | Admitting: Cardiology

## 2013-02-23 ENCOUNTER — Ambulatory Visit (INDEPENDENT_AMBULATORY_CARE_PROVIDER_SITE_OTHER): Payer: Medicare Other | Admitting: Cardiology

## 2013-02-23 VITALS — BP 148/78 | HR 65 | Ht 62.5 in | Wt 181.0 lb

## 2013-02-23 DIAGNOSIS — I5189 Other ill-defined heart diseases: Secondary | ICD-10-CM | POA: Insufficient documentation

## 2013-02-23 DIAGNOSIS — I359 Nonrheumatic aortic valve disorder, unspecified: Secondary | ICD-10-CM

## 2013-02-23 DIAGNOSIS — I5032 Chronic diastolic (congestive) heart failure: Secondary | ICD-10-CM

## 2013-02-23 DIAGNOSIS — I519 Heart disease, unspecified: Secondary | ICD-10-CM

## 2013-02-23 DIAGNOSIS — I10 Essential (primary) hypertension: Secondary | ICD-10-CM

## 2013-02-23 DIAGNOSIS — I482 Chronic atrial fibrillation, unspecified: Secondary | ICD-10-CM | POA: Insufficient documentation

## 2013-02-23 DIAGNOSIS — I4891 Unspecified atrial fibrillation: Secondary | ICD-10-CM

## 2013-02-23 DIAGNOSIS — I350 Nonrheumatic aortic (valve) stenosis: Secondary | ICD-10-CM

## 2013-02-23 NOTE — Patient Instructions (Signed)
Your physician recommends that you continue on your current medications as directed. Please refer to the Current Medication list given to you today.  Your physician wants you to follow-up in: 6 Months with Dr Turner You will receive a reminder letter in the mail two months in advance. If you don't receive a letter, please call our office to schedule the follow-up appointment.  

## 2013-02-23 NOTE — Progress Notes (Signed)
[address] [place], [place]  [address] Phone: [phone] Fax:  [phone]  Date:  02/23/2013   ID:  [name], DOB [birth year], MRN [id number]  PCP:  Lenora Boys, MD  Cardiologist:  Natasha Magic, MD     History of Present Illness: [name] is a 79 y.o. female with a history of chronic diastolic CHF, HTN, chronic atrial fibrillation who presents today for followup.  She is doing well.  She denies any chest pain, SOB, DOE, palpitations or syncope.  She does have some dizziness when she sits up in bed in the am but otherwise none.  She describes it more like vertigo which resolved with antivert.  She has chronic LE edema and her Lasix was uptitrated by Dr. Kathrene Bongo last week.   Wt Readings from Last 3 Encounters:  02/23/13 181 lb (82.101 kg)  10/01/12 181 lb 11.2 oz (82.419 kg)  08/20/12 182 lb (82.555 kg)     Past Medical History  Diagnosis Date  . Renal insufficiency, mild   . Anxiety   . Diabetes mellitus     diabetic retinopathy  . Anemia   . Aortic stenosis, mild   . Urinary tract infection   . CHF (congestive heart failure)   . Hypertension     [name]  . Fall at home   . Colostomy care     colon ca  . Colon cancer 30 year ago  . Chronic atrial fibrillation     not on anticoagulation due to increased risk of falls  . Diastolic dysfunction     Current Outpatient Prescriptions  Medication Sig Dispense Refill  . acetaminophen (TYLENOL) 500 MG tablet Take 500 mg by mouth every 6 (six) hours as needed for pain.       Marland Kitchen aspirin EC 81 MG tablet Take 81 mg by mouth daily.      . calcitRIOL (ROCALTROL) 0.25 MCG capsule Take 0.25 mcg by mouth every Monday, Wednesday, and Friday.       . carvedilol (COREG) 25 MG tablet Take 25 mg by mouth 2 (two) times daily with a meal.      . docusate sodium (COLACE) 100 MG capsule Take 100 mg by mouth daily as needed for constipation.       . furosemide (LASIX) 80 MG tablet Take 80 mg by mouth daily.  Take an extra 1/2 tablet in the afternoon on Monday, Wednesday, Friday      . insulin glargine (LANTUS) 100 UNIT/ML injection Inject 8 Units into the skin at bedtime as needed (if cbg over 150).       Marland Kitchen linagliptin (TRADJENTA) 5 MG TABS tablet Take 5 mg by mouth daily.       Marland Kitchen LORazepam (ATIVAN) 0.5 MG tablet Take 0.5 mg by mouth 2 (two) times daily.      . meclizine (ANTIVERT) 12.5 MG tablet Take 1 tablet by mouth as needed.      . mirtazapine (REMERON) 7.5 MG tablet Take 7.5 mg by mouth at bedtime.       . phenazopyridine (PYRIDIUM) 100 MG tablet Take 100 mg by mouth as needed for pain.      . vitamin B-12 (CYANOCOBALAMIN) 1000 MCG tablet Take 1,000 mcg by mouth daily.       No current facility-administered medications for this visit.    Allergies:    Allergies  Allergen Reactions  . Keflet [Cephalexin] Nausea Only  . Levaquin [Levofloxacin In D5w] Nausea Only  .  Oxycodone Other (See Comments)    Abnormal behavior  . Codeine Nausea Only  . Morphine And Related Nausea Only    Social History:  The patient  reports that she has never smoked. Her smokeless tobacco use includes Snuff. She reports that she does not drink alcohol or use illicit drugs.   Family History:  The patient's family history includes Cancer in her son; Diabetes in her daughter, sister, and son; Heart disease in her father and son; Hyperlipidemia in her daughter and son; Hypertension in her daughter and son; Other in her daughter.   ROS:  Please see the history of present illness.      All other systems reviewed and negative.   PHYSICAL EXAM: VS:  BP 148/78  Pulse 65  Ht 5' 2.5" (1.588 m)  Wt 181 lb (82.101 kg)  BMI 32.56 kg/m2 Well nourished, well developed, in no acute distress HEENT: normal Neck: no JVD Cardiac:  normal S1, S2; irregularly irregular with 2/6 SEM at RUSB to LLSB Lungs:  clear to auscultation bilaterally, no wheezing, rhonchi or rales Abd: soft, nontender, no hepatomegaly Ext: 1-2+  edema Skin: warm and dry Neuro:  CNs 2-12 intact, no focal abnormalities noted       ASSESSMENT AND PLAN:  1. Chronic diastolic CHF - mild LE edema due to CKD - lasix just increased and edema improved per patient 2. Mild AS by echo 2014 3. Chronic atrial fibrillation not a coumadin candidate  - continue carvedillol 4. HTN -fairly well controlled  - continue carvedilol 5. Chronic LE edema - improved with higher dose of lasix  - lasix just increased  Followup with me in 6 months  Signed, Natasha Magic, MD 02/23/2013 3:31 PM

## 2013-02-25 ENCOUNTER — Other Ambulatory Visit (HOSPITAL_COMMUNITY): Payer: Self-pay | Admitting: Family Medicine

## 2013-02-25 ENCOUNTER — Ambulatory Visit: Payer: Medicare Other | Admitting: Cardiology

## 2013-02-25 DIAGNOSIS — Z139 Encounter for screening, unspecified: Secondary | ICD-10-CM

## 2013-03-03 ENCOUNTER — Ambulatory Visit (HOSPITAL_COMMUNITY)
Admission: RE | Admit: 2013-03-03 | Discharge: 2013-03-03 | Disposition: A | Discharge status: Home / Self Care | Payer: Medicare Other | Source: Ambulatory Visit | Attending: Family Medicine | Admitting: Family Medicine

## 2013-03-03 DIAGNOSIS — Z1231 Encounter for screening mammogram for malignant neoplasm of breast: Secondary | ICD-10-CM | POA: Insufficient documentation

## 2013-03-03 DIAGNOSIS — Z139 Encounter for screening, unspecified: Secondary | ICD-10-CM

## 2013-03-10 ENCOUNTER — Ambulatory Visit: Payer: Medicare Other | Admitting: Cardiology

## 2013-07-13 ENCOUNTER — Encounter: Payer: Self-pay | Admitting: Vascular Surgery

## 2013-07-14 ENCOUNTER — Encounter: Payer: Self-pay | Admitting: Vascular Surgery

## 2013-07-14 ENCOUNTER — Ambulatory Visit (INDEPENDENT_AMBULATORY_CARE_PROVIDER_SITE_OTHER): Payer: Medicare Other | Admitting: Vascular Surgery

## 2013-07-14 ENCOUNTER — Other Ambulatory Visit (HOSPITAL_COMMUNITY): Payer: Medicare Other

## 2013-07-14 ENCOUNTER — Ambulatory Visit: Payer: Medicare Other | Admitting: Vascular Surgery

## 2013-07-14 ENCOUNTER — Encounter (HOSPITAL_COMMUNITY): Payer: Medicare Other

## 2013-07-14 VITALS — BP 152/79 | HR 64 | Resp 16 | Ht 63.0 in | Wt 179.0 lb

## 2013-07-14 DIAGNOSIS — N186 End stage renal disease: Secondary | ICD-10-CM

## 2013-07-14 NOTE — Progress Notes (Signed)
    Established Dialysis Access  History of Present Illness  [name] is a 80 y.o. (Apr 30, 1925) female who presents for re-evaluation for permanent access.  The patient is right hand dominant.  Previous access procedures have been completed in the left arm.  The patient's complication from previous access procedures include: thrombosis.  The patient has never had a previous PPM placed.  Her previous staged BVT occluded sometime over the last month.  The patient cannot determine when.  The patient's PMH, PSH, SH, FamHx, Med, and Allergies are unchanged from 10/01/12.  On ROS today: no SOB, continuing urination  Physical Examination  Filed Vitals:   07/14/13 1434  BP: 152/79  Pulse: 64  Resp: 16  Height: 5\' 3"  (1.6 m)  Weight: 179 lb (81.194 kg)   Body mass index is 31.72 kg/(m^2).  General: A&O x 3, WD, elderly  Pulmonary: Sym exp, good air movt, CTAB, no rales, rhonchi, & wheezing  Cardiac: RRR, Nl S1, S2, no Murmurs, rubs or gallops  Vascular: Vessel Right Left  Radial Faintly Palpable Faintly Palpable  Ulnar Not Palpable Not Palpable  Brachial Palpable Palpable   Gastrointestinal: soft, NTND, -G/R, - HSM, - masses, - CVAT B  Musculoskeletal: M/S 5/5 throughout , Extremities without  ischemic changes , no palpable thrill in access in L BVT, no bruit in access  Neurologic: Pain and light touch intact in extremities , Motor exam as listed above  Medical Decision Making  [name] is a 80 y.o. female who presents with chronic kidney disease stage III-IV   Based on vein mapping and examination, this patient's permanent access options include: B UA AVG placement  The patient is undecided on whether she would like hemodialysis.  She is currently against AVG placement and considering HD via a catheter.  If patient decides to proceed with AVG placement 1 month before commencing HD, contact us to proceed with such.  Adele Barthel, MD [facility] Office: [phone] Pager: [phone]  07/14/2013, 5:44 PM

## 2013-09-21 ENCOUNTER — Other Ambulatory Visit: Payer: Self-pay

## 2013-09-21 MED ORDER — FUROSEMIDE 80 MG PO TABS: 80.0000 mg | ORAL_TABLET | Freq: Every day | ORAL | Status: DC

## 2013-10-20 ENCOUNTER — Encounter: Payer: Self-pay | Admitting: Cardiology

## 2013-10-20 ENCOUNTER — Ambulatory Visit (INDEPENDENT_AMBULATORY_CARE_PROVIDER_SITE_OTHER): Payer: Medicare Other | Admitting: Cardiology

## 2013-10-20 VITALS — BP 130/70 | HR 67 | Ht 63.0 in | Wt 177.0 lb

## 2013-10-20 DIAGNOSIS — I482 Chronic atrial fibrillation, unspecified: Secondary | ICD-10-CM

## 2013-10-20 DIAGNOSIS — I77810 Thoracic aortic ectasia: Secondary | ICD-10-CM

## 2013-10-20 DIAGNOSIS — I350 Nonrheumatic aortic (valve) stenosis: Secondary | ICD-10-CM

## 2013-10-20 DIAGNOSIS — I359 Nonrheumatic aortic valve disorder, unspecified: Secondary | ICD-10-CM

## 2013-10-20 DIAGNOSIS — I4891 Unspecified atrial fibrillation: Secondary | ICD-10-CM

## 2013-10-20 DIAGNOSIS — I10 Essential (primary) hypertension: Secondary | ICD-10-CM

## 2013-10-20 DIAGNOSIS — I5032 Chronic diastolic (congestive) heart failure: Secondary | ICD-10-CM

## 2013-10-20 NOTE — Progress Notes (Signed)
[place], [place] [place], [place]  [address] Phone: [phone] Fax:  [phone]  Date:  10/20/2013   ID:  [name], DOB [birth year], MRN [id number]  PCP:  Abigail Miyamoto, MD  Cardiologist:  Fransico Him, MD     History of Present Illness: [name] is a 80 y.o. female with a history of chronic diastolic CHF, HTN, chronic atrial fibrillation who presents today for followup. She is doing well. She denies any chest pain, SOB, DOE, palpitations, dizziness or syncope. She has chronic LE edema which is stable.     Wt Readings from Last 3 Encounters:  10/20/13 177 lb (80.287 kg)  07/14/13 179 lb (81.194 kg)  02/23/13 181 lb (82.101 kg)     Past Medical History  Diagnosis Date  . Renal insufficiency, mild   . Anxiety   . Diabetes mellitus     diabetic retinopathy  . Anemia   . Aortic stenosis, mild   . Urinary tract infection   . Fall at home   . Colostomy care     colon ca  . Colon cancer 30 year ago  . Chronic atrial fibrillation     not on anticoagulation due to increased risk of falls  . Diastolic dysfunction   . Chronic diastolic CHF (congestive heart failure)   . Hypertension     tracy Calah Gershman    Current Outpatient Prescriptions  Medication Sig Dispense Refill  . acetaminophen (TYLENOL) 500 MG tablet Take 500 mg by mouth every 6 (six) hours as needed for pain.       Marland Kitchen aspirin EC 81 MG tablet Take 81 mg by mouth daily.      . calcitRIOL (ROCALTROL) 0.25 MCG capsule Take 0.25 mcg by mouth every Monday, Wednesday, and Friday.       . carvedilol (COREG) 25 MG tablet Take 25 mg by mouth 2 (two) times daily with a meal.      . docusate sodium (COLACE) 100 MG capsule Take 100 mg by mouth daily as needed for constipation.       . furosemide (LASIX) 80 MG tablet Take 1 tablet (80 mg total) by mouth daily. Take an extra 1/2 tablet in the afternoon on Monday, Wednesday, Friday  45 tablet  3  . insulin glargine (LANTUS) 100 UNIT/ML injection Inject 8 Units into  the skin at bedtime as needed (if cbg over 150).       Marland Kitchen linagliptin (TRADJENTA) 5 MG TABS tablet Take 5 mg by mouth daily.       Marland Kitchen LORazepam (ATIVAN) 0.5 MG tablet Take 0.5 mg by mouth 2 (two) times daily.      . meclizine (ANTIVERT) 12.5 MG tablet Take 1 tablet by mouth as needed.      . mirtazapine (REMERON) 7.5 MG tablet Take 7.5 mg by mouth at bedtime.       . phenazopyridine (PYRIDIUM) 100 MG tablet Take 100 mg by mouth as needed for pain.      . vitamin B-12 (CYANOCOBALAMIN) 1000 MCG tablet Take 1,000 mcg by mouth daily.       No current facility-administered medications for this visit.    Allergies:    Allergies  Allergen Reactions  . Keflet [Cephalexin] Nausea Only  . Levaquin [Levofloxacin In D5w] Nausea Only  . Oxycodone Other (See Comments)    Abnormal behavior  . Codeine Nausea Only  . Morphine And Related Nausea Only    Social History:  The patient  reports that she has never smoked. Her smokeless tobacco use includes Snuff. She reports that she does not drink alcohol or use illicit drugs.   Family History:  The patient's family history includes Cancer in her son; Diabetes in her daughter, sister, and son; Heart disease in her father and son; Hyperlipidemia in her daughter and son; Hypertension in her daughter and son; Other in her daughter.   ROS:  Please see the history of present illness.      All other systems reviewed and negative.   PHYSICAL EXAM: VS:  BP 130/70  Pulse 67  Ht 5\' 3"  (1.6 m)  Wt 177 lb (80.287 kg)  BMI 31.36 kg/m2 Well nourished, well developed, in no acute distress HEENT: normal Neck: no JVD Cardiac:  normal S1, S2; IRREG Irreg; 2/6 SM at RUSB to LLSB with radiation to her carotid arteries Lungs:  clear to auscultation bilaterally, no wheezing, rhonchi or rales Abd: soft, nontender, no hepatomegaly Ext: no edema Skin: warm and dry Neuro:  CNs 2-12 intact, no focal abnormalities noted  EKG:  Atrial fibrillation with CVR, anteroseptal  infarct, nonspecific ST abnormality - no change from 2014  ASSESSMENT AND PLAN: 1. Chronic diastolic CHF - she appears euvolemic on exam 2. Mild AS by echo 2014 3. Chronic atrial fibrillation not a coumadin candidate.  Rate controlled - continue carvedilol/ASA 4. HTN -fairly well controlled - continue carvedilol  5. Chronic LE edema - improved with higher dose of lasix 6. Mildly dilated aortic root - repeat 2D echo to assess stability  Followup with me in 6 months    Signed, Fransico Him, MD 10/20/2013 3:22 PM

## 2013-10-20 NOTE — Patient Instructions (Signed)
Your physician recommends that you continue on your current medications as directed. Please refer to the Current Medication list given to you today.  Your physician has requested that you have an echocardiogram. Echocardiography is a painless test that uses sound waves to create images of your heart. It provides your doctor with information about the size and shape of your heart and how well your heart's chambers and valves are working. This procedure takes approximately one hour. There are no restrictions for this procedure.  Your physician wants you to follow-up in: 6 months with Dr Turner You will receive a reminder letter in the mail two months in advance. If you don't receive a letter, please call our office to schedule the follow-up appointment.  

## 2013-10-26 ENCOUNTER — Ambulatory Visit (HOSPITAL_COMMUNITY): Payer: Medicare Other | Attending: Cardiology | Admitting: Radiology

## 2013-10-26 DIAGNOSIS — I350 Nonrheumatic aortic (valve) stenosis: Secondary | ICD-10-CM

## 2013-10-26 DIAGNOSIS — I359 Nonrheumatic aortic valve disorder, unspecified: Secondary | ICD-10-CM | POA: Insufficient documentation

## 2013-10-26 DIAGNOSIS — I77810 Thoracic aortic ectasia: Secondary | ICD-10-CM

## 2013-10-26 NOTE — Progress Notes (Signed)
Echocardiogram performed.  

## 2013-10-27 ENCOUNTER — Other Ambulatory Visit: Payer: Self-pay | Admitting: General Surgery

## 2013-10-27 DIAGNOSIS — I350 Nonrheumatic aortic (valve) stenosis: Secondary | ICD-10-CM

## 2013-11-12 ENCOUNTER — Emergency Department (HOSPITAL_COMMUNITY): Payer: Medicare Other

## 2013-11-12 ENCOUNTER — Encounter (HOSPITAL_COMMUNITY): Payer: Self-pay | Admitting: Emergency Medicine

## 2013-11-12 ENCOUNTER — Emergency Department (HOSPITAL_COMMUNITY)
Admission: EM | Admit: 2013-11-12 | Discharge: 2013-11-12 | Disposition: A | Discharge status: Home / Self Care | Payer: Medicare Other | Attending: Emergency Medicine | Admitting: Emergency Medicine

## 2013-11-12 DIAGNOSIS — I10 Essential (primary) hypertension: Secondary | ICD-10-CM | POA: Diagnosis not present

## 2013-11-12 DIAGNOSIS — Z8659 Personal history of other mental and behavioral disorders: Secondary | ICD-10-CM | POA: Diagnosis not present

## 2013-11-12 DIAGNOSIS — S0003XA Contusion of scalp, initial encounter: Secondary | ICD-10-CM | POA: Insufficient documentation

## 2013-11-12 DIAGNOSIS — E119 Type 2 diabetes mellitus without complications: Secondary | ICD-10-CM | POA: Insufficient documentation

## 2013-11-12 DIAGNOSIS — S99929A Unspecified injury of unspecified foot, initial encounter: Secondary | ICD-10-CM

## 2013-11-12 DIAGNOSIS — S8990XA Unspecified injury of unspecified lower leg, initial encounter: Secondary | ICD-10-CM | POA: Insufficient documentation

## 2013-11-12 DIAGNOSIS — W1809XA Striking against other object with subsequent fall, initial encounter: Secondary | ICD-10-CM | POA: Diagnosis not present

## 2013-11-12 DIAGNOSIS — Z794 Long term (current) use of insulin: Secondary | ICD-10-CM | POA: Diagnosis not present

## 2013-11-12 DIAGNOSIS — Z7982 Long term (current) use of aspirin: Secondary | ICD-10-CM | POA: Diagnosis not present

## 2013-11-12 DIAGNOSIS — Y9289 Other specified places as the place of occurrence of the external cause: Secondary | ICD-10-CM | POA: Diagnosis not present

## 2013-11-12 DIAGNOSIS — I5032 Chronic diastolic (congestive) heart failure: Secondary | ICD-10-CM | POA: Insufficient documentation

## 2013-11-12 DIAGNOSIS — Z862 Personal history of diseases of the blood and blood-forming organs and certain disorders involving the immune mechanism: Secondary | ICD-10-CM | POA: Insufficient documentation

## 2013-11-12 DIAGNOSIS — S0990XA Unspecified injury of head, initial encounter: Secondary | ICD-10-CM | POA: Insufficient documentation

## 2013-11-12 DIAGNOSIS — Y9389 Activity, other specified: Secondary | ICD-10-CM | POA: Insufficient documentation

## 2013-11-12 DIAGNOSIS — S1093XA Contusion of unspecified part of neck, initial encounter: Principal | ICD-10-CM

## 2013-11-12 DIAGNOSIS — Z85038 Personal history of other malignant neoplasm of large intestine: Secondary | ICD-10-CM | POA: Diagnosis not present

## 2013-11-12 DIAGNOSIS — Z79899 Other long term (current) drug therapy: Secondary | ICD-10-CM | POA: Insufficient documentation

## 2013-11-12 DIAGNOSIS — IMO0002 Reserved for concepts with insufficient information to code with codable children: Secondary | ICD-10-CM | POA: Insufficient documentation

## 2013-11-12 DIAGNOSIS — I4891 Unspecified atrial fibrillation: Secondary | ICD-10-CM | POA: Diagnosis not present

## 2013-11-12 DIAGNOSIS — W19XXXA Unspecified fall, initial encounter: Secondary | ICD-10-CM

## 2013-11-12 DIAGNOSIS — S0083XA Contusion of other part of head, initial encounter: Principal | ICD-10-CM | POA: Insufficient documentation

## 2013-11-12 DIAGNOSIS — Z87440 Personal history of urinary (tract) infections: Secondary | ICD-10-CM | POA: Insufficient documentation

## 2013-11-12 DIAGNOSIS — S99919A Unspecified injury of unspecified ankle, initial encounter: Secondary | ICD-10-CM

## 2013-11-12 LAB — BASIC METABOLIC PANEL
ANION GAP: 14 (ref 5–15)
BUN: 38 mg/dL — ABNORMAL HIGH (ref 6–23)
CO2: 27 meq/L (ref 19–32)
Calcium: 9.6 mg/dL (ref 8.4–10.5)
Chloride: 101 mEq/L (ref 96–112)
Creatinine, Ser: 2.46 mg/dL — ABNORMAL HIGH (ref 0.50–1.10)
GFR calc Af Amer: 19 mL/min — ABNORMAL LOW (ref >90)
GFR calc non Af Amer: 17 mL/min — ABNORMAL LOW (ref >90)
GLUCOSE: 117 mg/dL — AB (ref 70–99)
Potassium: 4.7 mEq/L (ref 3.7–5.3)
SODIUM: 142 meq/L (ref 137–147)

## 2013-11-12 LAB — CBC WITH DIFFERENTIAL/PLATELET
BASOS ABS: 0 10*3/uL (ref 0.0–0.1)
Basophils Relative: 0 % (ref 0–1)
Eosinophils Absolute: 0 10*3/uL (ref 0.0–0.7)
Eosinophils Relative: 0 % (ref 0–5)
HCT: 38.4 % (ref 36.0–46.0)
Hemoglobin: 11.8 g/dL — ABNORMAL LOW (ref 12.0–15.0)
LYMPHS ABS: 1.3 10*3/uL (ref 0.7–4.0)
LYMPHS PCT: 15 % (ref 12–46)
MCH: 28 pg (ref 26.0–34.0)
MCHC: 30.7 g/dL (ref 30.0–36.0)
MCV: 91.2 fL (ref 78.0–100.0)
Monocytes Absolute: 0.5 10*3/uL (ref 0.1–1.0)
Monocytes Relative: 6 % (ref 3–12)
NEUTROS PCT: 79 % — AB (ref 43–77)
Neutro Abs: 7.1 10*3/uL (ref 1.7–7.7)
PLATELETS: 145 10*3/uL — AB (ref 150–400)
RBC: 4.21 MIL/uL (ref 3.87–5.11)
RDW: 13.5 % (ref 11.5–15.5)
WBC: 9 10*3/uL (ref 4.0–10.5)

## 2013-11-12 LAB — URINALYSIS, ROUTINE W REFLEX MICROSCOPIC
Bilirubin Urine: NEGATIVE
GLUCOSE, UA: NEGATIVE mg/dL
Hgb urine dipstick: NEGATIVE
Ketones, ur: NEGATIVE mg/dL
Leukocytes, UA: NEGATIVE
NITRITE: NEGATIVE
PH: 6.5 (ref 5.0–8.0)
Protein, ur: NEGATIVE mg/dL
Specific Gravity, Urine: 1.007 (ref 1.005–1.030)
Urobilinogen, UA: 0.2 mg/dL (ref 0.0–1.0)

## 2013-11-12 NOTE — ED Notes (Signed)
To ED for eval of nausea and diarrhea for the past week. Pt states she got up this am and her 'feet got tangled' causing her to fall. No LOC. Left great toe pain with no noted deformity, bruising to right forehead, bilateral knee abrasions noted. Pt alert and oriented. Pt not on blood thinners. States she's had a headache for a few days. Pupils equal

## 2013-11-12 NOTE — Discharge Instructions (Signed)

## 2013-11-12 NOTE — ED Provider Notes (Signed)
CSN: [id number]     Arrival date & time 11/12/13  1113 History   First MD Initiated Contact with Patient 11/12/13 1241     Chief Complaint  Patient presents with  . Fall  . Nausea  . Diarrhea     (Consider location/radiation/quality/duration/timing/severity/associated sxs/prior Treatment) HPI Comments: 80 yo female presenting after a mechanical fall.  She states she tripped over her feet and fell. She struck her head, but did not lose consciousness.  The only other injury that she endorses is to her left big toe.    Patient is a 80 y.o. female presenting with fall.  Fall This is a new problem. The current episode started 1 to 2 hours ago. Episode frequency: once. The problem has been resolved. Pertinent negatives include no chest pain, no abdominal pain, no headaches and no shortness of breath. Associated symptoms comments: Headache.  Left toe pain.. Nothing aggravates the symptoms. Nothing relieves the symptoms. She has tried nothing for the symptoms.    Past Medical History  Diagnosis Date  . Renal insufficiency, mild   . Anxiety   . Diabetes mellitus     diabetic retinopathy  . Anemia   . Aortic stenosis, mild   . Urinary tract infection   . Fall at home   . Colostomy care     colon ca  . Colon cancer 30 year ago  . Chronic atrial fibrillation     not on anticoagulation due to increased risk of falls  . Diastolic dysfunction   . Chronic diastolic CHF (congestive heart failure)   . Hypertension     tracy turner   Past Surgical History  Procedure Laterality Date  . Abdominal surgery    . Abdominal hysterectomy    . Colon surgery    . Appendectomy    . Colostomy    . Cardioversion  09/30/2011    Procedure: CARDIOVERSION;  Surgeon: [name], MD;  Location: [place];  Service: Cardiovascular;  Laterality: N/A;  . I&d extremity  11/20/2011    Procedure: IRRIGATION AND DEBRIDEMENT EXTREMITY;  Surgeon: Alta Corning, MD;  Location: [place];  Service: Orthopedics;   Laterality: Left;  . Av fistula placement Left 06/02/2012    Procedure: ARTERIOVENOUS (AV) FISTULA CREATION;  Surgeon: Conrad San Pablo, MD;  Location: [facility];  Service: Vascular;  Laterality: Left;  Bascilic Fistula  . Bascilic vein transposition Left 08/18/2012    Procedure: BASCILIC VEIN TRANSPOSITION;  Surgeon: Conrad Trumann, MD;  Location[facility];  Service: Vascular;  Laterality: Left;  Left 2nd stage Basilic Vein Transposition    Family History  Problem Relation Age of Onset  . Heart disease Father   . Diabetes Sister   . Diabetes Daughter   . Hyperlipidemia Daughter   . Hypertension Daughter   . Other Daughter     varicose veins  . Cancer Son   . Diabetes Son   . Heart disease Son     before age 15  . Hyperlipidemia Son   . Hypertension Son    History  Substance Use Topics  . Smoking status: Never Smoker   . Smokeless tobacco: Current User    Types: Snuff  . Alcohol Use: No   OB History   Grav Para Term Preterm Abortions TAB SAB Ect Mult Living                 Review of Systems  Respiratory: Negative for shortness of breath.   Cardiovascular: Negative for chest  pain.  Gastrointestinal: Negative for abdominal pain.  Neurological: Negative for headaches.  All other systems reviewed and are negative.     Allergies  Codeine; Keflet; Levaquin; Morphine and related; and Oxycodone  Home Medications   Prior to Admission medications   Medication Sig Start Date End Date Taking? Authorizing Provider  acetaminophen (TYLENOL) 500 MG tablet Take 500 mg by mouth every 6 (six) hours as needed for pain.    Yes Historical Provider, MD  aspirin EC 81 MG tablet Take 81 mg by mouth daily.   Yes Historical Provider, MD  calcitRIOL (ROCALTROL) 0.25 MCG capsule Take 0.25 mcg by mouth every Monday, Wednesday, and Friday.  04/06/12  Yes Historical Provider, MD  carvedilol (COREG) 25 MG tablet Take 25 mg by mouth 2 (two) times daily with a meal.   Yes Historical Provider, MD  docusate sodium  (COLACE) 100 MG capsule Take 100 mg by mouth daily as needed for constipation.    Yes Historical Provider, MD  furosemide (LASIX) 80 MG tablet Take 80-120 mg by mouth 2 (two) times daily. Take 80 mg by mouth once daily. Take an extra 40 mg in the afternoon on days Monday, Wednesday and Friday.   Yes Historical Provider, MD  insulin glargine (LANTUS) 100 UNIT/ML injection Inject 8 Units into the skin at bedtime as needed (if cbg over 150).    Yes Historical Provider, MD  linagliptin (TRADJENTA) 5 MG TABS tablet Take 5 mg by mouth daily.    Yes Historical Provider, MD  LORazepam (ATIVAN) 0.5 MG tablet Take 0.5 mg by mouth 2 (two) times daily.   Yes Historical Provider, MD  meclizine (ANTIVERT) 12.5 MG tablet Take 12.5 mg by mouth as needed for dizziness.  11/23/12  Yes Historical Provider, MD  mirtazapine (REMERON) 7.5 MG tablet Take 7.5 mg by mouth at bedtime.  09/28/12  Yes Historical Provider, MD  phenazopyridine (PYRIDIUM) 100 MG tablet Take 100 mg by mouth as needed for pain.   Yes Historical Provider, MD  vitamin B-12 (CYANOCOBALAMIN) 1000 MCG tablet Take 1,000 mcg by mouth daily.   Yes Historical Provider, MD   BP 142/51  Pulse 60  Temp(Src) 98 F (36.7 C) (Oral)  Resp 20  SpO2 97% Physical Exam  Nursing note and vitals reviewed. Constitutional: She is oriented to person, place, and time. She appears well-developed and well-nourished. No distress.  HENT:  Head: Normocephalic and atraumatic. Head is without raccoon's eyes and without Battle's sign.    Nose: Nose normal.  Eyes: Conjunctivae and EOM are normal. Pupils are equal, round, and reactive to light. No scleral icterus.  Neck: No spinous process tenderness and no muscular tenderness present.  Cardiovascular: Normal rate, regular rhythm, normal heart sounds and intact distal pulses.   No murmur heard. Pulmonary/Chest: Effort normal and breath sounds normal. She has no rales. She exhibits no tenderness.  Abdominal: Soft. There is no  tenderness. There is no rebound and no guarding.  Musculoskeletal: Normal range of motion. She exhibits no edema and no tenderness.       Thoracic back: She exhibits no tenderness and no bony tenderness.       Lumbar back: She exhibits no tenderness and no bony tenderness.       Feet:  No evidence of trauma to extremities, except as noted.  2+ distal pulses.    Neurological: She is alert and oriented to person, place, and time.  Skin: Skin is warm and dry. No rash noted.  Psychiatric: She has  a normal mood and affect.    ED Course  Procedures (including critical care time) Labs Review Labs Reviewed  CBC WITH DIFFERENTIAL - Abnormal; Notable for the following:    Hemoglobin 11.8 (*)    Platelets 145 (*)    Neutrophils Relative % 79 (*)    All other components within normal limits  BASIC METABOLIC PANEL - Abnormal; Notable for the following:    Glucose, Bld 117 (*)    BUN 38 (*)    Creatinine, Ser 2.46 (*)    GFR calc non Af Amer 17 (*)    GFR calc Af Amer 19 (*)    All other components within normal limits  URINALYSIS, ROUTINE W REFLEX MICROSCOPIC - Abnormal; Notable for the following:    APPearance HAZY (*)    All other components within normal limits    Imaging Review Ct Head Wo Contrast  11/12/2013   CLINICAL DATA:  Pain post trauma  EXAM: CT HEAD WITHOUT CONTRAST  CT CERVICAL SPINE WITHOUT CONTRAST  TECHNIQUE: Multidetector CT imaging of the head and cervical spine was performed following the standard protocol without intravenous contrast. Multiplanar CT image reconstructions of the cervical spine were also generated.  COMPARISON:  Head CT May 16, 2012  FINDINGS: CT HEAD FINDINGS  Moderate diffuse atrophy is stable. There is no mass, hemorrhage, extra-axial fluid collection, or midline shift. There is evidence of a prior infarct in the lateral right occipital lobe. There is also evidence of a prior infarct in the posterior aspect of the right globus pallidum, stable. There  is mild periventricular small vessel disease. No acute infarct apparent. No new gray-white compartment lesions are identified.  Bony calvarium appears intact.  The mastoid air cells are clear.  CT CERVICAL SPINE FINDINGS  There is no fracture or spondylolisthesis. There is lower cervical levoscoliosis. Prevertebral soft tissues and predental space regions are normal. There is moderately severe disc space narrowing at all levels except for C2-3. There is facet osteoarthritic change to varying degrees at all levels bilaterally. There is no frank disc extrusion. There is borderline stenosis at C5-6 and C6-7 due to bony hypertrophy and diffuse disc protrusion. There is calcification in both carotid arteries.  IMPRESSION: CT head: Atrophy with prior infarcts and small vessel disease. No intracranial mass, hemorrhage, or extra-axial fluid. No new gray-white compartment lesion.  CT cervical spine: Multilevel arthropathy. Mild stenosis C5-6 and C6-7 due to bony hypertrophy and disc protrusion. No frank disc extrusion. No fracture or spondylolisthesis. There is lower cervical levoscoliosis. There is carotid artery calcification bilaterally.   Electronically Signed   By: Lowella Grip M.D.   On: 11/12/2013 14:30   Ct Cervical Spine Wo Contrast  11/12/2013   CLINICAL DATA:  Pain post trauma  EXAM: CT HEAD WITHOUT CONTRAST  CT CERVICAL SPINE WITHOUT CONTRAST  TECHNIQUE: Multidetector CT imaging of the head and cervical spine was performed following the standard protocol without intravenous contrast. Multiplanar CT image reconstructions of the cervical spine were also generated.  COMPARISON:  Head CT May 16, 2012  FINDINGS: CT HEAD FINDINGS  Moderate diffuse atrophy is stable. There is no mass, hemorrhage, extra-axial fluid collection, or midline shift. There is evidence of a prior infarct in the lateral right occipital lobe. There is also evidence of a prior infarct in the posterior aspect of the right globus  pallidum, stable. There is mild periventricular small vessel disease. No acute infarct apparent. No new gray-white compartment lesions are identified.  Bony calvarium appears intact.  The mastoid air cells are clear.  CT CERVICAL SPINE FINDINGS  There is no fracture or spondylolisthesis. There is lower cervical levoscoliosis. Prevertebral soft tissues and predental space regions are normal. There is moderately severe disc space narrowing at all levels except for C2-3. There is facet osteoarthritic change to varying degrees at all levels bilaterally. There is no frank disc extrusion. There is borderline stenosis at C5-6 and C6-7 due to bony hypertrophy and diffuse disc protrusion. There is calcification in both carotid arteries.  IMPRESSION: CT head: Atrophy with prior infarcts and small vessel disease. No intracranial mass, hemorrhage, or extra-axial fluid. No new gray-white compartment lesion.  CT cervical spine: Multilevel arthropathy. Mild stenosis C5-6 and C6-7 due to bony hypertrophy and disc protrusion. No frank disc extrusion. No fracture or spondylolisthesis. There is lower cervical levoscoliosis. There is carotid artery calcification bilaterally.   Electronically Signed   By: Lowella Grip M.D.   On: 11/12/2013 14:30  All radiology studies independently viewed by me.      EKG Interpretation None      MDM   Final diagnoses:  Fall, initial encounter   80 year old female who presents after a mechanical fall. Only injuries appear to be a contusion to her right forehead and pain in her left great toe. Just some mild abrasions to her knees without any evidence of bony injury. CTs of head and neck negative. Laboratory screening workup unremarkable. Stable for discharge home.   Houston Siren III, MD 11/13/13 [phone]

## 2013-11-21 ENCOUNTER — Other Ambulatory Visit: Payer: Self-pay

## 2013-11-21 MED ORDER — CARVEDILOL 25 MG PO TABS
25.0000 mg | ORAL_TABLET | Freq: Two times a day (BID) | ORAL | Status: DC
Start: 2013-11-21 — End: 2014-03-13

## 2013-12-15 ENCOUNTER — Encounter: Payer: Self-pay | Admitting: Cardiology

## 2014-01-14 ENCOUNTER — Other Ambulatory Visit: Payer: Self-pay | Admitting: Cardiology

## 2014-03-12 ENCOUNTER — Encounter (HOSPITAL_BASED_OUTPATIENT_CLINIC_OR_DEPARTMENT_OTHER): Payer: Self-pay | Admitting: *Deleted

## 2014-03-12 ENCOUNTER — Emergency Department (HOSPITAL_BASED_OUTPATIENT_CLINIC_OR_DEPARTMENT_OTHER): Payer: Medicare Other

## 2014-03-12 ENCOUNTER — Inpatient Hospital Stay (HOSPITAL_BASED_OUTPATIENT_CLINIC_OR_DEPARTMENT_OTHER)
Admission: EM | Admit: 2014-03-12 | Discharge: 2014-03-14 | DRG: 194 | Disposition: A | Discharge status: Home Health | Payer: Medicare Other | Attending: Internal Medicine | Admitting: Internal Medicine

## 2014-03-12 DIAGNOSIS — R059 Cough, unspecified: Secondary | ICD-10-CM

## 2014-03-12 DIAGNOSIS — Z794 Long term (current) use of insulin: Secondary | ICD-10-CM

## 2014-03-12 DIAGNOSIS — F419 Anxiety disorder, unspecified: Secondary | ICD-10-CM | POA: Diagnosis present

## 2014-03-12 DIAGNOSIS — J189 Pneumonia, unspecified organism: Secondary | ICD-10-CM | POA: Diagnosis present

## 2014-03-12 DIAGNOSIS — I482 Chronic atrial fibrillation, unspecified: Secondary | ICD-10-CM | POA: Diagnosis present

## 2014-03-12 DIAGNOSIS — Z90710 Acquired absence of both cervix and uterus: Secondary | ICD-10-CM

## 2014-03-12 DIAGNOSIS — I350 Nonrheumatic aortic (valve) stenosis: Secondary | ICD-10-CM | POA: Diagnosis present

## 2014-03-12 DIAGNOSIS — Z9049 Acquired absence of other specified parts of digestive tract: Secondary | ICD-10-CM | POA: Diagnosis present

## 2014-03-12 DIAGNOSIS — Z7982 Long term (current) use of aspirin: Secondary | ICD-10-CM

## 2014-03-12 DIAGNOSIS — Z886 Allergy status to analgesic agent status: Secondary | ICD-10-CM | POA: Diagnosis not present

## 2014-03-12 DIAGNOSIS — Z933 Colostomy status: Secondary | ICD-10-CM

## 2014-03-12 DIAGNOSIS — I129 Hypertensive chronic kidney disease with stage 1 through stage 4 chronic kidney disease, or unspecified chronic kidney disease: Secondary | ICD-10-CM | POA: Diagnosis present

## 2014-03-12 DIAGNOSIS — I5189 Other ill-defined heart diseases: Secondary | ICD-10-CM

## 2014-03-12 DIAGNOSIS — E876 Hypokalemia: Secondary | ICD-10-CM

## 2014-03-12 DIAGNOSIS — Z4931 Encounter for adequacy testing for hemodialysis: Secondary | ICD-10-CM

## 2014-03-12 DIAGNOSIS — Z792 Long term (current) use of antibiotics: Secondary | ICD-10-CM

## 2014-03-12 DIAGNOSIS — I5032 Chronic diastolic (congestive) heart failure: Secondary | ICD-10-CM

## 2014-03-12 DIAGNOSIS — F17220 Nicotine dependence, chewing tobacco, uncomplicated: Secondary | ICD-10-CM | POA: Diagnosis present

## 2014-03-12 DIAGNOSIS — Z87440 Personal history of urinary (tract) infections: Secondary | ICD-10-CM

## 2014-03-12 DIAGNOSIS — Z882 Allergy status to sulfonamides status: Secondary | ICD-10-CM | POA: Diagnosis not present

## 2014-03-12 DIAGNOSIS — C189 Malignant neoplasm of colon, unspecified: Secondary | ICD-10-CM

## 2014-03-12 DIAGNOSIS — R05 Cough: Secondary | ICD-10-CM

## 2014-03-12 DIAGNOSIS — E11319 Type 2 diabetes mellitus with unspecified diabetic retinopathy without macular edema: Secondary | ICD-10-CM | POA: Diagnosis present

## 2014-03-12 DIAGNOSIS — I77810 Thoracic aortic ectasia: Secondary | ICD-10-CM

## 2014-03-12 DIAGNOSIS — Z85038 Personal history of other malignant neoplasm of large intestine: Secondary | ICD-10-CM

## 2014-03-12 DIAGNOSIS — N186 End stage renal disease: Secondary | ICD-10-CM

## 2014-03-12 DIAGNOSIS — D696 Thrombocytopenia, unspecified: Secondary | ICD-10-CM

## 2014-03-12 DIAGNOSIS — Z881 Allergy status to other antibiotic agents status: Secondary | ICD-10-CM | POA: Diagnosis not present

## 2014-03-12 DIAGNOSIS — N179 Acute kidney failure, unspecified: Secondary | ICD-10-CM

## 2014-03-12 DIAGNOSIS — N189 Chronic kidney disease, unspecified: Secondary | ICD-10-CM

## 2014-03-12 DIAGNOSIS — N184 Chronic kidney disease, stage 4 (severe): Secondary | ICD-10-CM | POA: Diagnosis present

## 2014-03-12 DIAGNOSIS — R1011 Right upper quadrant pain: Secondary | ICD-10-CM

## 2014-03-12 DIAGNOSIS — N390 Urinary tract infection, site not specified: Secondary | ICD-10-CM

## 2014-03-12 DIAGNOSIS — I10 Essential (primary) hypertension: Secondary | ICD-10-CM

## 2014-03-12 DIAGNOSIS — E118 Type 2 diabetes mellitus with unspecified complications: Secondary | ICD-10-CM

## 2014-03-12 LAB — URINALYSIS, ROUTINE W REFLEX MICROSCOPIC
BILIRUBIN URINE: NEGATIVE
Glucose, UA: NEGATIVE mg/dL
Hgb urine dipstick: NEGATIVE
Ketones, ur: NEGATIVE mg/dL
Leukocytes, UA: NEGATIVE
NITRITE: NEGATIVE
PH: 6 (ref 5.0–8.0)
Protein, ur: NEGATIVE mg/dL
SPECIFIC GRAVITY, URINE: 1.006 (ref 1.005–1.030)
UROBILINOGEN UA: 1 mg/dL (ref 0.0–1.0)

## 2014-03-12 LAB — CBC WITH DIFFERENTIAL/PLATELET
BASOS ABS: 0 10*3/uL (ref 0.0–0.1)
BASOS PCT: 0 % (ref 0–1)
Eosinophils Absolute: 0 10*3/uL (ref 0.0–0.7)
Eosinophils Relative: 0 % (ref 0–5)
HEMATOCRIT: 32.4 % — AB (ref 36.0–46.0)
Hemoglobin: 10 g/dL — ABNORMAL LOW (ref 12.0–15.0)
LYMPHS PCT: 14 % (ref 12–46)
Lymphs Abs: 1.4 10*3/uL (ref 0.7–4.0)
MCH: 28.5 pg (ref 26.0–34.0)
MCHC: 30.9 g/dL (ref 30.0–36.0)
MCV: 92.3 fL (ref 78.0–100.0)
MONO ABS: 1 10*3/uL (ref 0.1–1.0)
Monocytes Relative: 9 % (ref 3–12)
NEUTROS ABS: 7.7 10*3/uL (ref 1.7–7.7)
Neutrophils Relative %: 77 % (ref 43–77)
PLATELETS: 140 10*3/uL — AB (ref 150–400)
RBC: 3.51 MIL/uL — ABNORMAL LOW (ref 3.87–5.11)
RDW: 13.8 % (ref 11.5–15.5)
WBC: 10.1 10*3/uL (ref 4.0–10.5)

## 2014-03-12 LAB — BASIC METABOLIC PANEL
ANION GAP: 13 (ref 5–15)
BUN: 36 mg/dL — ABNORMAL HIGH (ref 6–23)
CO2: 28 meq/L (ref 19–32)
Calcium: 9.2 mg/dL (ref 8.4–10.5)
Chloride: 102 mEq/L (ref 96–112)
Creatinine, Ser: 2.5 mg/dL — ABNORMAL HIGH (ref 0.50–1.10)
GFR calc Af Amer: 19 mL/min — ABNORMAL LOW (ref >90)
GFR calc non Af Amer: 16 mL/min — ABNORMAL LOW (ref >90)
Glucose, Bld: 115 mg/dL — ABNORMAL HIGH (ref 70–99)
Potassium: 4.1 mEq/L (ref 3.7–5.3)
SODIUM: 143 meq/L (ref 137–147)

## 2014-03-12 LAB — CBG MONITORING, ED: GLUCOSE-CAPILLARY: 105 mg/dL — AB (ref 70–99)

## 2014-03-12 LAB — GLUCOSE, CAPILLARY: Glucose-Capillary: 106 mg/dL — ABNORMAL HIGH (ref 70–99)

## 2014-03-12 MED ORDER — HEPARIN SODIUM (PORCINE) 5000 UNIT/ML IJ SOLN
5000.0000 [IU] | Freq: Three times a day (TID) | INTRAMUSCULAR | Status: DC
  Administered 2014-03-13 – 2014-03-14 (×5): 5000 [IU] via SUBCUTANEOUS
  Filled 2014-03-12 (×8): qty 1

## 2014-03-12 MED ORDER — FUROSEMIDE 80 MG PO TABS
80.0000 mg | ORAL_TABLET | Freq: Every day | ORAL | Status: DC
  Administered 2014-03-13 – 2014-03-14 (×2): 80 mg via ORAL
  Filled 2014-03-12 (×2): qty 1

## 2014-03-12 MED ORDER — ASPIRIN EC 81 MG PO TBEC
81.0000 mg | DELAYED_RELEASE_TABLET | Freq: Every day | ORAL | Status: DC
  Administered 2014-03-13 – 2014-03-14 (×2): 81 mg via ORAL
  Filled 2014-03-12 (×2): qty 1

## 2014-03-12 MED ORDER — DEXTROSE 5 % IV SOLN
500.0000 mg | Freq: Once | INTRAVENOUS | Status: AC
  Administered 2014-03-12: 500 mg via INTRAVENOUS
  Filled 2014-03-12: qty 500

## 2014-03-12 MED ORDER — DEXTROSE 5 % IV SOLN
500.0000 mg | INTRAVENOUS | Status: DC
  Administered 2014-03-13: 500 mg via INTRAVENOUS
  Filled 2014-03-12 (×2): qty 500

## 2014-03-12 MED ORDER — MIRTAZAPINE 7.5 MG PO TABS
7.5000 mg | ORAL_TABLET | Freq: Every day | ORAL | Status: DC
  Administered 2014-03-13 (×2): 7.5 mg via ORAL
  Filled 2014-03-12 (×3): qty 1

## 2014-03-12 MED ORDER — LEVOFLOXACIN 750 MG PO TABS: 750.0000 mg | ORAL_TABLET | ORAL | Status: DC

## 2014-03-12 MED ORDER — CARVEDILOL 25 MG PO TABS
25.0000 mg | ORAL_TABLET | Freq: Two times a day (BID) | ORAL | Status: DC
  Administered 2014-03-13 – 2014-03-14 (×3): 25 mg via ORAL
  Filled 2014-03-12 (×5): qty 1

## 2014-03-12 MED ORDER — LORAZEPAM 0.5 MG PO TABS
0.5000 mg | ORAL_TABLET | Freq: Two times a day (BID) | ORAL | Status: DC
  Administered 2014-03-13 – 2014-03-14 (×4): 0.5 mg via ORAL
  Filled 2014-03-12 (×4): qty 1

## 2014-03-12 MED ORDER — DEXTROSE 5 % IV SOLN
1.0000 g | Freq: Once | INTRAVENOUS | Status: AC
  Administered 2014-03-12: 1 g via INTRAVENOUS

## 2014-03-12 MED ORDER — CALCITRIOL 0.25 MCG PO CAPS
0.2500 ug | ORAL_CAPSULE | Freq: Every day | ORAL | Status: DC
  Administered 2014-03-13 – 2014-03-14 (×2): 0.25 ug via ORAL
  Filled 2014-03-12 (×2): qty 1

## 2014-03-12 MED ORDER — CEFTRIAXONE SODIUM 1 G IJ SOLR
INTRAMUSCULAR | Status: AC
  Filled 2014-03-12: qty 10

## 2014-03-12 MED ORDER — ACETAMINOPHEN 325 MG PO TABS
650.0000 mg | ORAL_TABLET | Freq: Four times a day (QID) | ORAL | Status: DC | PRN
  Administered 2014-03-13: 650 mg via ORAL
  Filled 2014-03-12: qty 2

## 2014-03-12 MED ORDER — INSULIN ASPART 100 UNIT/ML ~~LOC~~ SOLN
0.0000 [IU] | Freq: Three times a day (TID) | SUBCUTANEOUS | Status: DC
  Administered 2014-03-13 – 2014-03-14 (×2): 2 [IU] via SUBCUTANEOUS

## 2014-03-12 MED ORDER — MECLIZINE HCL 12.5 MG PO TABS
12.5000 mg | ORAL_TABLET | ORAL | Status: DC | PRN
  Filled 2014-03-12: qty 1

## 2014-03-12 MED ORDER — INSULIN ASPART 100 UNIT/ML ~~LOC~~ SOLN: 0.0000 [IU] | Freq: Every day | SUBCUTANEOUS | Status: DC

## 2014-03-12 MED ORDER — ONDANSETRON HCL 4 MG/2ML IJ SOLN
4.0000 mg | Freq: Four times a day (QID) | INTRAMUSCULAR | Status: DC | PRN
  Administered 2014-03-13: 4 mg via INTRAVENOUS
  Filled 2014-03-12 (×2): qty 2

## 2014-03-12 MED ORDER — CEFTRIAXONE SODIUM IN DEXTROSE 20 MG/ML IV SOLN
1.0000 g | INTRAVENOUS | Status: DC
  Administered 2014-03-14: 1 g via INTRAVENOUS
  Filled 2014-03-12 (×3): qty 50

## 2014-03-12 NOTE — ED Notes (Signed)
Report given to [facility]  

## 2014-03-12 NOTE — ED Notes (Signed)
I took CBG reading and got 105 mg. / dcltr.

## 2014-03-12 NOTE — ED Notes (Signed)
Pt transported via [facility]  

## 2014-03-12 NOTE — ED Notes (Signed)
Pt changed in to gown for admission- Sitting in recliner, waiting for transport to [facility]- family at bedside

## 2014-03-12 NOTE — H&P (Signed)
[facility] History and Physical  Patient: [name]  TXH:[id number]  DOB: [birth year]  DOS: the patient was seen and examined on 03/12/2014 PCP: Abigail Miyamoto, MD  Chief Complaint: Shortness of breath  HPI: [name] is a 80 y.o. female with Past medical history of diabetes mellitus, aortic stenosis, hypertension, A. fib, diastolic dysfunction. Patient presented with numbness of progressively worsening shortness of breath associated with cough. She mentions she has a cold like symptoms for last 2 weeks with runny nose sneezing but since last 3 days she has been having increasingly worsening cough. The cough has also been more productive since last 2 days with yellowish expectoration. She denies any blood in it. She denies any fever or chills but did have some at home. No diarrhea no nausea no vomiting no choking. No recent hospitalization. She has been taking her regular medications at home. No sick contacts reported.  The patient is coming from home. And at her baseline independent for most of her ADL.  Review of Systems: as mentioned in the history of present illness.  A Comprehensive review of the other systems is negative.  Past Medical History  Diagnosis Date  . Renal insufficiency, mild   . Anxiety   . Diabetes mellitus     diabetic retinopathy  . Anemia   . Aortic stenosis, mild   . Urinary tract infection   . Fall at home   . Colostomy care     colon ca  . Colon cancer 30 year ago  . Chronic atrial fibrillation     not on anticoagulation due to increased risk of falls  . Diastolic dysfunction   . Chronic diastolic CHF (congestive heart failure)   . Hypertension     tracy turner   Past Surgical History  Procedure Laterality Date  . Abdominal surgery    . Abdominal hysterectomy    . Colon surgery    . Appendectomy    . Colostomy    . Cardioversion  09/30/2011    Procedure: CARDIOVERSION;  Surgeon: [name], MD;  Location: [place];  Service:  Cardiovascular;  Laterality: N/A;  . I&d extremity  11/20/2011    Procedure: IRRIGATION AND DEBRIDEMENT EXTREMITY;  Surgeon: Alta Corning, MD;  Location: [place];  Service: Orthopedics;  Laterality: Left;  . Av fistula placement Left 06/02/2012    Procedure: ARTERIOVENOUS (AV) FISTULA CREATION;  Surgeon: Conrad [place], MD;  Location: [place];  Service: Vascular;  Laterality: Left;  Bascilic Fistula  . Bascilic vein transposition Left 08/18/2012    Procedure: BASCILIC VEIN TRANSPOSITION;  Surgeon: Conrad Caldwell, MD;  Location: [place];  Service: Vascular;  Laterality: Left;  Left 2nd stage Basilic Vein Transposition    Social History:  reports that she has never smoked. Her smokeless tobacco use includes Snuff. She reports that she does not drink alcohol or use illicit drugs.  Allergies  Allergen Reactions  . Codeine Nausea Only  . Keflet [Cephalexin] Nausea Only  . Levaquin [Levofloxacin In D5w] Nausea Only  . Morphine And Related Nausea Only  . Oxycodone Other (See Comments)    Abnormal behavior    Family History  Problem Relation Age of Onset  . Heart disease Father   . Diabetes Sister   . Diabetes Daughter   . Hyperlipidemia Daughter   . Hypertension Daughter   . Other Daughter     varicose veins  . Cancer Son   . Diabetes Son   . Heart  disease Son     before age 52  . Hyperlipidemia Son   . Hypertension Son     Prior to Admission medications   Medication Sig Start Date End Date Taking? Authorizing Provider  acetaminophen (TYLENOL) 500 MG tablet Take 500 mg by mouth every 6 (six) hours as needed for pain.     Historical Provider, MD  aspirin EC 81 MG tablet Take 81 mg by mouth daily.    Historical Provider, MD  calcitRIOL (ROCALTROL) 0.25 MCG capsule Take 0.25 mcg by mouth daily.  04/06/12   Historical Provider, MD  carvedilol (COREG) 25 MG tablet Take 1 tablet (25 mg total) by mouth 2 (two) times daily with a meal. 11/21/13   [name], MD  docusate sodium (COLACE) 100 MG  capsule Take 100 mg by mouth daily as needed for constipation.     Historical Provider, MD  furosemide (LASIX) 80 MG tablet TAKE 1 TABLET BY MOUTH DAILY, TAKE AN EXTRA 1/2 TABLET IN THE AFTERNOON ON MONDAY,WEDNESDAY,&FRIDAY 01/16/14   [name], MD  insulin glargine (LANTUS) 100 UNIT/ML injection Inject 8 Units into the skin at bedtime as needed (if cbg over 150).     Historical Provider, MD  levofloxacin (LEVAQUIN) 750 MG tablet Take 1 tablet (750 mg total) by mouth every other day. For 5 doses 03/12/14   Malvin Johns, MD  linagliptin (TRADJENTA) 5 MG TABS tablet Take 5 mg by mouth daily.     Historical Provider, MD  LORazepam (ATIVAN) 0.5 MG tablet Take 0.5 mg by mouth 2 (two) times daily.    Historical Provider, MD  meclizine (ANTIVERT) 12.5 MG tablet Take 12.5 mg by mouth as needed for dizziness.  11/23/12   Historical Provider, MD  mirtazapine (REMERON) 7.5 MG tablet Take 7.5 mg by mouth at bedtime.  09/28/12   Historical Provider, MD  phenazopyridine (PYRIDIUM) 100 MG tablet Take 100 mg by mouth as needed for pain.    Historical Provider, MD  vitamin B-12 (CYANOCOBALAMIN) 1000 MCG tablet Take 1,000 mcg by mouth daily.    Historical Provider, MD    Physical Exam: Filed Vitals:   03/12/14 1444 03/12/14 1656 03/12/14 1906 03/12/14 2159  BP: 137/71 119/95 134/64 138/61  Pulse: 66 65 57 68  Temp: 98.4 F (36.9 C)  98 F (36.7 C) 98 F (36.7 C)  TempSrc: Oral   Oral  Resp: 18 20 20 20   Height: 5\' 3"  (1.6 m)     Weight: 80.287 kg (177 lb)     SpO2: 99% 93% 93% 93%    General: Alert, Awake and Oriented to Time, Place and Person. Appear in mild distress Eyes: PERRL ENT: Oral Mucosa clear moist. Neck: no JVD Cardiovascular: S1 and S2 Present, no Murmur, Peripheral Pulses Present Respiratory: Bilateral Air entry equal and Decreased, right Crackles, no wheezes Abdomen: Bowel Sound present, Soft and no tender Skin: no Rash Extremities: no Pedal edema, no calf tenderness Neurologic:  Grossly no focal neuro deficit.  Labs on Admission:  CBC:  Recent Labs Lab 03/12/14 1615  WBC 10.1  NEUTROABS 7.7  HGB 10.0*  HCT 32.4*  MCV 92.3  PLT 140*    CMP     Component Value Date/Time   NA 143 03/12/2014 1615   K 4.1 03/12/2014 1615   CL 102 03/12/2014 1615   CO2 28 03/12/2014 1615   GLUCOSE 115* 03/12/2014 1615   BUN 36* 03/12/2014 1615   CREATININE 2.50* 03/12/2014 1615   CALCIUM 9.2 03/12/2014  1615   PROT 6.7 12/28/2012 2055   ALBUMIN 3.5 12/28/2012 2055   AST 17 12/28/2012 2055   ALT 7 12/28/2012 2055   ALKPHOS 124* 12/28/2012 2055   BILITOT 0.3 12/28/2012 2055   GFRNONAA 16* 03/12/2014 1615   GFRAA 19* 03/12/2014 1615    No results for input(s): LIPASE, AMYLASE in the last 168 hours. No results for input(s): AMMONIA in the last 168 hours.  No results for input(s): CKTOTAL, CKMB, CKMBINDEX, TROPONINI in the last 168 hours. BNP (last 3 results) No results for input(s): PROBNP in the last 8760 hours.  Radiological Exams on Admission: Dg Chest 2 View  03/12/2014   CLINICAL DATA:  Productive cough for 1 day. Cough for 1 week. History of colon carcinoma  EXAM: CHEST  2 VIEW  COMPARISON:  November 03, 2012  FINDINGS: There is consolidation in the right middle lobe. There is also some consolidation in portions of the right lower lobe. There is a small right effusion. There is underlying emphysema. The left lung is clear. Heart is upper normal in size with pulmonary vascularity within normal limits. No adenopathy. There is calcification in the right carotid artery.  IMPRESSION: Areas of consolidation in the right middle lobe and to a lesser extent right lower lobe. Left lung clear. Underlying emphysema. Right carotid artery calcification.   Electronically Signed   By: Lowella Grip M.D.   On: 03/12/2014 15:19     Assessment/Plan Principal Problem:   CAP (community acquired pneumonia) Active Problems:   Diabetes mellitus, type II   Hypertension   CKD  (chronic kidney disease) stage 4, GFR 15-29 ml/min   Chronic diastolic CHF (congestive heart failure), NYHA class 2   Aortic stenosis, mild   Chronic atrial fibrillation   1. CAP (community acquired pneumonia) Patient is presenting with complaints of cough and is found to have community-acquired pneumonia on chest x-ray. At present we will continue treating her with ceftriaxone and azithromycin. Looking at the chest x-ray she will require a follow-up of chest x-ray for clearance of the infiltrate. Continue ceftriaxone and azithromycin. Follow cultures.  2. Diabetes mellitus. Continue sliding scale.  3. Hypertension. Chronic diastolic heart failure. At present not volume overloaded continuing home medications.  4. Anxiety Continue home medications.  5. Chronic kidney disease. At present serum creatinine stable. Continue close monitoring.  Advance goals of care discussion: Full code  DVT Prophylaxis: subcutaneous Heparin Nutrition: Nothing by mouth  Family Communication: Family was present at bedside, opportunity was given to ask question and all questions were answered satisfactorily at the time of interview. Disposition: Admitted to inpatient in telemetry unit.  Author: Berle Mull, MD [facility] Pager: [phone] 03/12/2014, 11:30 PM    If 7PM-7AM, please contact night-coverage [email or web address] Password [email or web address]

## 2014-03-12 NOTE — Evaluation (Signed)
EDP: [facility]  Reason for Admit: CAP   88 YOF w/ 2-3 days of cough. No fevers, SOB, chest pain. Baseline hx/o CHF, CKD. RML and RLL PNA on imaging. Cr 2.5 (baseline). Otherwise well appearing. Started on Rocephin and Azithro. Was considering d/c home, but PORT score 96 (gradeIV)--> rec inpatient admission because of mortality risk. Accepted to tele bed at [facility].

## 2014-03-12 NOTE — ED Notes (Signed)
Pt /o URI symptoms  x 3 days

## 2014-03-12 NOTE — ED Notes (Signed)
Pt ambulated around the dept well, pulse ox 95-98%

## 2014-03-12 NOTE — ED Provider Notes (Signed)
CSN: [id number]     Arrival date & time 03/12/14  1439 History  This chart was scribed for Malvin Johns, MD by Randa Evens, ED Scribe. This patient was seen in room MH08/MH08 and the patient's care was started at 3:25 PM.     Chief Complaint  Patient presents with  . URI   Patient is a 80 y.o. female presenting with URI. The history is provided by the patient. No language interpreter was used.  URI Presenting symptoms: congestion, cough, rhinorrhea and sore throat   Presenting symptoms: no fatigue and no fever   Cough:    Cough characteristics:  Productive   Sputum characteristics:  Yellow Severity:  Moderate Onset quality:  Gradual Duration:  3 days Chronicity:  New Associated symptoms: sneezing   Associated symptoms: no arthralgias and no headaches   Risk factors: being elderly    HPI Comments: [name] is a 80 y.o. female who presents to the Emergency Department complaining of URI symptoms for the past 3 days. Pt states she has had associated productive cough of yellow sputum that began today, rhinorrhea, sneezing, sore throat and some chest congestion. Pt states she has had some intermittent abdominal pain the past 3 days as well. Pt states she has some intermittent leg swelling that she takes Lasix once per day. Denies SOB, CP, nausea, vomiting or dysuria. Denies any recent hospitalizations within the past 3 months.   Past Medical History  Diagnosis Date  . Renal insufficiency, mild   . Anxiety   . Diabetes mellitus     diabetic retinopathy  . Anemia   . Aortic stenosis, mild   . Urinary tract infection   . Fall at home   . Colostomy care     colon ca  . Colon cancer 30 year ago  . Chronic atrial fibrillation     not on anticoagulation due to increased risk of falls  . Diastolic dysfunction   . Chronic diastolic CHF (congestive heart failure)   . Hypertension     tracy turner   Past Surgical History  Procedure Laterality Date  . Abdominal surgery    .  Abdominal hysterectomy    . Colon surgery    . Appendectomy    . Colostomy    . Cardioversion  09/30/2011    Procedure: CARDIOVERSION;  Surgeon: [name], MD;  Location: [place];  Service: Cardiovascular;  Laterality: N/A;  . I&d extremity  11/20/2011    Procedure: IRRIGATION AND DEBRIDEMENT EXTREMITY;  Surgeon: Alta Corning, MD;  Location: [place];  Service: Orthopedics;  Laterality: Left;  . Av fistula placement Left 06/02/2012    Procedure: ARTERIOVENOUS (AV) FISTULA CREATION;  Surgeon: Conrad [place], MD;  Location: [place];  Service: Vascular;  Laterality: Left;  Bascilic Fistula  . Bascilic vein transposition Left 08/18/2012    Procedure: BASCILIC VEIN TRANSPOSITION;  Surgeon: Conrad [place], MD;  Location: [facility];  Service: Vascular;  Laterality: Left;  Left 2nd stage Basilic Vein Transposition    Family History  Problem Relation Age of Onset  . Heart disease Father   . Diabetes Sister   . Diabetes Daughter   . Hyperlipidemia Daughter   . Hypertension Daughter   . Other Daughter     varicose veins  . Cancer Son   . Diabetes Son   . Heart disease Son     before age 61  . Hyperlipidemia Son   . Hypertension Son    History  Substance  Use Topics  . Smoking status: Never Smoker   . Smokeless tobacco: Current User    Types: Snuff  . Alcohol Use: No   OB History    No data available     Review of Systems  Constitutional: Negative for fever, chills, diaphoresis and fatigue.  HENT: Positive for congestion, rhinorrhea, sneezing and sore throat.   Eyes: Negative.   Respiratory: Positive for cough. Negative for chest tightness and shortness of breath.   Cardiovascular: Negative for chest pain and leg swelling.  Gastrointestinal: Negative for nausea, vomiting, abdominal pain, diarrhea and blood in stool.  Genitourinary: Negative for dysuria, frequency, hematuria, flank pain and difficulty urinating.  Musculoskeletal: Negative for back pain and arthralgias.  Skin: Negative for rash.   Neurological: Negative for dizziness, speech difficulty, weakness, numbness and headaches.    Allergies  Codeine; Keflet; Levaquin; Morphine and related; and Oxycodone  Home Medications   Prior to Admission medications   Medication Sig Start Date End Date Taking? Authorizing Provider  acetaminophen (TYLENOL) 500 MG tablet Take 500 mg by mouth every 6 (six) hours as needed for pain.     Historical Provider, MD  aspirin EC 81 MG tablet Take 81 mg by mouth daily.    Historical Provider, MD  calcitRIOL (ROCALTROL) 0.25 MCG capsule Take 0.25 mcg by mouth every Monday, Wednesday, and Friday.  04/06/12   Historical Provider, MD  carvedilol (COREG) 25 MG tablet Take 1 tablet (25 mg total) by mouth 2 (two) times daily with a meal. 11/21/13   [name], MD  docusate sodium (COLACE) 100 MG capsule Take 100 mg by mouth daily as needed for constipation.     Historical Provider, MD  furosemide (LASIX) 80 MG tablet TAKE 1 TABLET BY MOUTH DAILY, TAKE AN EXTRA 1/2 TABLET IN THE AFTERNOON ON MONDAY,WEDNESDAY,&FRIDAY 01/16/14   [name], MD  insulin glargine (LANTUS) 100 UNIT/ML injection Inject 8 Units into the skin at bedtime as needed (if cbg over 150).     Historical Provider, MD  levofloxacin (LEVAQUIN) 750 MG tablet Take 1 tablet (750 mg total) by mouth every other day. For 5 doses 03/12/14   Malvin Johns, MD  linagliptin (TRADJENTA) 5 MG TABS tablet Take 5 mg by mouth daily.     Historical Provider, MD  LORazepam (ATIVAN) 0.5 MG tablet Take 0.5 mg by mouth 2 (two) times daily.    Historical Provider, MD  meclizine (ANTIVERT) 12.5 MG tablet Take 12.5 mg by mouth as needed for dizziness.  11/23/12   Historical Provider, MD  mirtazapine (REMERON) 7.5 MG tablet Take 7.5 mg by mouth at bedtime.  09/28/12   Historical Provider, MD  phenazopyridine (PYRIDIUM) 100 MG tablet Take 100 mg by mouth as needed for pain.    Historical Provider, MD  vitamin B-12 (CYANOCOBALAMIN) 1000 MCG tablet Take 1,000 mcg by  mouth daily.    Historical Provider, MD   Triage Vitals: BP 137/71 mmHg  Pulse 66  Temp(Src) 98.4 F (36.9 C) (Oral)  Resp 18  Ht 5\' 3"  (1.6 m)  Wt 177 lb (80.287 kg)  BMI 31.36 kg/m2  SpO2 99%  Physical Exam  Constitutional: She is oriented to person, place, and time. She appears well-developed and well-nourished.  HENT:  Head: Normocephalic and atraumatic.  Eyes: Pupils are equal, round, and reactive to light.  Neck: Normal range of motion. Neck supple.  Cardiovascular: Normal rate, regular rhythm and normal heart sounds.   Pulmonary/Chest: Effort normal. No respiratory distress. She has  no wheezes. She has rales (right side). She exhibits no tenderness.  Abdominal: Soft. Bowel sounds are normal. There is no tenderness. There is no rebound and no guarding.  Musculoskeletal: Normal range of motion. She exhibits no edema.  Lymphadenopathy:    She has no cervical adenopathy.  Neurological: She is alert and oriented to person, place, and time.  Skin: Skin is warm and dry. No rash noted.  Psychiatric: She has a normal mood and affect.    ED Course  Procedures (including critical care time) DIAGNOSTIC STUDIES: Oxygen Saturation is 99% on RA, normal by my interpretation.    COORDINATION OF CARE: 3:34 PM-Discussed treatment plan with pt at bedside and pt agreed to plan.     Labs Review Labs Reviewed  BASIC METABOLIC PANEL - Abnormal; Notable for the following:    Glucose, Bld 115 (*)    BUN 36 (*)    Creatinine, Ser 2.50 (*)    GFR calc non Af Amer 16 (*)    GFR calc Af Amer 19 (*)    All other components within normal limits  CBC WITH DIFFERENTIAL - Abnormal; Notable for the following:    RBC 3.51 (*)    Hemoglobin 10.0 (*)    HCT 32.4 (*)    Platelets 140 (*)    All other components within normal limits  URINALYSIS, ROUTINE W REFLEX MICROSCOPIC    Imaging Review Dg Chest 2 View  03/12/2014   CLINICAL DATA:  Productive cough for 1 day. Cough for 1 week. History  of colon carcinoma  EXAM: CHEST  2 VIEW  COMPARISON:  November 03, 2012  FINDINGS: There is consolidation in the right middle lobe. There is also some consolidation in portions of the right lower lobe. There is a small right effusion. There is underlying emphysema. The left lung is clear. Heart is upper normal in size with pulmonary vascularity within normal limits. No adenopathy. There is calcification in the right carotid artery.  IMPRESSION: Areas of consolidation in the right middle lobe and to a lesser extent right lower lobe. Left lung clear. Underlying emphysema. Right carotid artery calcification.   Electronically Signed   By: Lowella Grip M.D.   On: 03/12/2014 15:19     EKG Interpretation None      MDM   Final diagnoses:  Community acquired pneumonia    Pt with RML pneumonia.  Looks well.  Given IV rocephin and zithromax for CAP.  Pt initially wanted to try outpt treatment, but when I calculated her PSI/PORT score, it is risk class IV which is recommending inpt treatment.  Her creatinine is elevated, but at baseline.  No hypoxia.  Will consult hospitalist for transfer to [facility].   I personally performed the services described in this documentation, which was scribed in my presence.  The recorded information has been reviewed and considered.    Malvin Johns, MD 03/23/14 [phone]

## 2014-03-13 ENCOUNTER — Other Ambulatory Visit: Payer: Self-pay | Admitting: Cardiology

## 2014-03-13 DIAGNOSIS — I350 Nonrheumatic aortic (valve) stenosis: Secondary | ICD-10-CM

## 2014-03-13 LAB — COMPREHENSIVE METABOLIC PANEL
ALK PHOS: 161 U/L — AB (ref 39–117)
AST: 11 U/L (ref 0–37)
Albumin: 2.5 g/dL — ABNORMAL LOW (ref 3.5–5.2)
Anion gap: 16 — ABNORMAL HIGH (ref 5–15)
BILIRUBIN TOTAL: 0.4 mg/dL (ref 0.3–1.2)
BUN: 33 mg/dL — ABNORMAL HIGH (ref 6–23)
CHLORIDE: 102 meq/L (ref 96–112)
CO2: 21 meq/L (ref 19–32)
Calcium: 8.7 mg/dL (ref 8.4–10.5)
Creatinine, Ser: 2.3 mg/dL — ABNORMAL HIGH (ref 0.50–1.10)
GFR calc Af Amer: 21 mL/min — ABNORMAL LOW (ref >90)
GFR calc non Af Amer: 18 mL/min — ABNORMAL LOW (ref >90)
Glucose, Bld: 88 mg/dL (ref 70–99)
Potassium: 3 mEq/L — ABNORMAL LOW (ref 3.7–5.3)
SODIUM: 139 meq/L (ref 137–147)
Total Protein: 5.6 g/dL — ABNORMAL LOW (ref 6.0–8.3)

## 2014-03-13 LAB — CBC WITH DIFFERENTIAL/PLATELET
BASOS PCT: 0 % (ref 0–1)
Basophils Absolute: 0 10*3/uL (ref 0.0–0.1)
EOS ABS: 0 10*3/uL (ref 0.0–0.7)
Eosinophils Relative: 0 % (ref 0–5)
HCT: 30.6 % — ABNORMAL LOW (ref 36.0–46.0)
HEMOGLOBIN: 9.4 g/dL — AB (ref 12.0–15.0)
Lymphocytes Relative: 17 % (ref 12–46)
Lymphs Abs: 1.5 10*3/uL (ref 0.7–4.0)
MCH: 27.6 pg (ref 26.0–34.0)
MCHC: 30.7 g/dL (ref 30.0–36.0)
MCV: 90 fL (ref 78.0–100.0)
Monocytes Absolute: 0.7 10*3/uL (ref 0.1–1.0)
Monocytes Relative: 8 % (ref 3–12)
NEUTROS PCT: 75 % (ref 43–77)
Neutro Abs: 6.8 10*3/uL (ref 1.7–7.7)
Platelets: 145 10*3/uL — ABNORMAL LOW (ref 150–400)
RBC: 3.4 MIL/uL — ABNORMAL LOW (ref 3.87–5.11)
RDW: 14 % (ref 11.5–15.5)
WBC: 9.1 10*3/uL (ref 4.0–10.5)

## 2014-03-13 LAB — GLUCOSE, CAPILLARY
GLUCOSE-CAPILLARY: 108 mg/dL — AB (ref 70–99)
Glucose-Capillary: 91 mg/dL (ref 70–99)
Glucose-Capillary: 139 mg/dL — ABNORMAL HIGH (ref 70–99)
Glucose-Capillary: 118 mg/dL — ABNORMAL HIGH (ref 70–99)

## 2014-03-13 LAB — EXPECTORATED SPUTUM ASSESSMENT W REFEX TO RESP CULTURE

## 2014-03-13 LAB — STREP PNEUMONIAE URINARY ANTIGEN: Strep Pneumo Urinary Antigen: NEGATIVE

## 2014-03-13 LAB — EXPECTORATED SPUTUM ASSESSMENT W GRAM STAIN, RFLX TO RESP C

## 2014-03-13 NOTE — Evaluation (Signed)
Physical Therapy Evaluation Patient Details Name: [name] MRN: [id number] DOB: Apr 21, 1925 Today's Date: 03/13/2014   History of Present Illness  Adm 03/12/14 with pneumonia PMHx-DM, retinopathy, colon Ca with colostomy, afib, CHF  Clinical Impression  Pt admitted with above diagnosis. Pt currently with functional limitations due to the deficits listed below (see PT Problem List). Very limited mobility assessment due to nausea and vomiting. Pt will benefit from skilled PT to increase their independence and safety with mobility to allow discharge to the venue listed below.       Follow Up Recommendations Home health PT;Supervision/Assistance - 24 hour    Equipment Recommendations  None recommended by PT    Recommendations for Other Services OT consult     Precautions / Restrictions        Mobility  Bed Mobility Overal bed mobility: Modified Independent             General bed mobility comments: used rail and HOB 20 (did not lower due to decr SaO2 at rest)  Transfers Overall transfer level: Needs assistance Equipment used: None Transfers: Sit to/from Stand Sit to Stand: Supervision         General transfer comment: one step up towards [facility] and return to sitting  Ambulation/Gait             General Gait Details: not tested due to vomiting  Stairs            Wheelchair Mobility    Modified Rankin (Stroke Patients Only)       Balance Overall balance assessment: No apparent balance deficits (not formally assessed) (sat EOB without UE support and steady)                                           Pertinent Vitals/Pain Pain Assessment: No/denies pain    Home Living Family/patient expects to be discharged to:: Private residence Living Arrangements: Alone Available Help at Discharge: Family;Available 24 hours/day (daughter is on disability due to back pain) Type of Home: House         Home Equipment: Walker - 4  wheels Additional Comments: full home set-up not obtained due to vomiting    Prior Function Level of Independence: Independent with assistive device(s)         Comments: daughter states pt can stay with her at her home with 24/7 care     Hand Dominance        Extremity/Trunk Assessment   Upper Extremity Assessment: Overall WFL for tasks assessed           Lower Extremity Assessment: Overall WFL for tasks assessed      Cervical / Trunk Assessment: Kyphotic (reports she uses her rollator due to back pain)  Communication   Communication: No difficulties  Cognition Arousal/Alertness: Awake/alert Behavior During Therapy: WFL for tasks assessed/performed Overall Cognitive Status: Within Functional Limits for tasks assessed                      General Comments General comments (skin integrity, edema, etc.): SaO2 90% supine on room air; incr to 97% on room air sitting EOB; pt began coughing (productive) and then began vomiting; returned to [place] [address]; pt refusing nausea meds    Exercises        Assessment/Plan    PT Assessment Patient needs continued PT services  PT Diagnosis  Generalized weakness   PT Problem List Decreased activity tolerance;Decreased mobility;Cardiopulmonary status limiting activity  PT Treatment Interventions DME instruction;Gait training;Stair training;Functional mobility training;Therapeutic activities;Patient/family education   PT Goals (Current goals can be found in the Care Plan section) Acute Rehab PT Goals Patient Stated Goal: return to her own home PT Goal Formulation: Patient unable to participate in goal setting (vomiting and nauseous) Time For Goal Achievement: 03/20/14 Potential to Achieve Goals: Good    Frequency Min 3X/week   Barriers to discharge Other (comment) pt has refused to stay with her daughter in the past    Co-evaluation               End of Session   Activity Tolerance: Treatment limited  secondary to medical complications (Comment) (N/V) Patient left: in bed;with call bell/phone within reach;with family/visitor present Nurse Communication: Mobility status;Other (comment) (vomiting with coughing)         Time: 7615-1834 PT Time Calculation (min) (ACUTE ONLY): 14 min   Charges:   PT Evaluation $Initial PT Evaluation Tier I: 1 Procedure     PT G Codes:          Bonner Larue 2014-03-22, 4:05 PM Pager [phone]

## 2014-03-13 NOTE — Progress Notes (Signed)
Informed by PT that pt vomited x1 while getting pt to sit up on side of the bed.  Pt reufsed antiemetic med.  Says she is ok.  Encourage her to call if she has any more episode.  Instructed her to use emesis basin if it occur again.  Verbalized understanding.  Will continue to monitor.  Karie Kirks, Therapist, sports.

## 2014-03-13 NOTE — Progress Notes (Signed)
Patient arrived to the floor via EMS awake and oriented x 4. Denies c/o pain/discomfort at present time. Patient is ambulatory and MAE. Patient's daughter and son in law are present with her. Patient oriented to room and call bell system to which she v/u. Patient maintained on telemetry and is in Afib with a heart rate of 76. Will continue to monitor.  Esperanza Heir, RN

## 2014-03-13 NOTE — Progress Notes (Signed)
UR completed [place] [address] K. Cardarius Senat, RN, BSN, MSHL, CCM  03/13/2014 11:19 AM

## 2014-03-13 NOTE — Progress Notes (Signed)
Patient ID: [name]  female  ZOX:[id number]    DOB: 1926/02/19    DOA: 03/12/2014  PCP: Abigail Miyamoto, MD  Brief history of present illness  Patient is a 80 year old female with diabetes, aortic stenosis, hypertension, 8 of ablation, diastolic CHF presented with progressively worsening shortness of breath with cough. Patient reported cold-like symptoms for the last weeks, runny nose sneezing. However in the last 3 days she was having increasingly worsening cough, productive with yellowish expectoration. Chest x-ray showed areas of consolidation in the right middle lobe and right lower lobe with underlying emphysema. Patient was admitted for further workup.  Assessment/Plan: Principal Problem:   CAP (community acquired pneumonia)/right middle lobe and right lower lobe - Feeling somewhat better today, daughter reports no aspiration issues - Continue IV Zithromax, Rocephin, follow sputum cultures - Urine strep antigen negative, urine legionella antigen pending, continue symptomatic treatment  Active Problems:   Diabetes mellitus, type II -Continue sliding scale insulin     Hypertension - Currently stable    CKD (chronic kidney disease) stage 4, GFR 15-29 ml/min - Creatinine fraction improving from yesterday, unknown baseline    Chronic diastolic CHF (congestive heart failure), NYHA class 2 -Currently euvolemic, continue home medications   DVT Prophylaxis: subcutaneous heparin   Code Status: full code   Family Communication: discussed in detail with daughter at the bedside    Disposition: Hopefully next 24-48 hours, out of bed to chair today, PT evaluation   Consultants:  None  Procedures:  CXR  Antibiotics:  IV Zithromax    IV Rocephin   Subjective: Patient seen and examined, feeling somewhat better today, daughter at the bedside no fevers or chills, still having some cough   Objective Weight change:   Intake/Output Summary (Last 24 hours) at 03/13/14  1125 Last data filed at 03/13/14 0900  Gross per 24 hour  Intake    480 ml  Output    600 ml  Net   -120 ml   Blood pressure 132/64, pulse 66, temperature 98.6 F (37 C), temperature source Oral, resp. rate 18, height 5\' 3"  (1.6 m), weight 79.606 kg (175 lb 8 oz), SpO2 92 %.  Physical Exam: General: Alert and awake, oriented x3, not in any acute distress. CVS: S1-S2 clear, no murmur rubs or gallops Chest: Decreased breath sounds at the bases, crackles right side Abdomen  soft nontender, nondistended, normal bowel sounds  Extremities: no cyanosis, clubbing or edema noted bilaterally Neuro: Cranial nerves II-XII intact, no focal neurological deficits  Lab Results: Basic Metabolic Panel:  Recent Labs Lab 03/12/14 1615 03/13/14 0407  NA 143 139  K 4.1 3.0*  CL 102 102  CO2 28 21  GLUCOSE 115* 88  BUN 36* 33*  CREATININE 2.50* 2.30*  CALCIUM 9.2 8.7   Liver Function Tests:  Recent Labs Lab 03/13/14 0407  AST 11  ALT <5  ALKPHOS 161*  BILITOT 0.4  PROT 5.6*  ALBUMIN 2.5*   No results for input(s): LIPASE, AMYLASE in the last 168 hours. No results for input(s): AMMONIA in the last 168 hours. CBC:  Recent Labs Lab 03/12/14 1615 03/13/14 0407  WBC 10.1 9.1  NEUTROABS 7.7 6.8  HGB 10.0* 9.4*  HCT 32.4* 30.6*  MCV 92.3 90.0  PLT 140* 145*   Cardiac Enzymes: No results for input(s): CKTOTAL, CKMB, CKMBINDEX, TROPONINI in the last 168 hours. BNP: Invalid input(s): POCBNP CBG:  Recent Labs Lab 03/12/14 2022 03/12/14 2258 03/13/14 0544  GLUCAP 105* 106* 91  Micro Results: Recent Results (from the past 240 hour(s))  Culture, sputum-assessment     Status: None   Collection Time: 03/13/14  7:25 AM  Result Value Ref Range Status   Specimen Description SPUTUM  Final   Special Requests NONE  Final   Sputum evaluation   Final    MICROSCOPIC FINDINGS SUGGEST THAT THIS SPECIMEN IS NOT REPRESENTATIVE OF LOWER RESPIRATORY SECRETIONS. PLEASE  RECOLLECT. Gram Stain Report Called to,Read Back By and Verified With: NELLIE RN AT [phone] 03/13/14 BY K BARR    Report Status 03/13/2014 FINAL  Final    Studies/Results: Dg Chest 2 View  03/12/2014   CLINICAL DATA:  Productive cough for 1 day. Cough for 1 week. History of colon carcinoma  EXAM: CHEST  2 VIEW  COMPARISON:  November 03, 2012  FINDINGS: There is consolidation in the right middle lobe. There is also some consolidation in portions of the right lower lobe. There is a small right effusion. There is underlying emphysema. The left lung is clear. Heart is upper normal in size with pulmonary vascularity within normal limits. No adenopathy. There is calcification in the right carotid artery.  IMPRESSION: Areas of consolidation in the right middle lobe and to a lesser extent right lower lobe. Left lung clear. Underlying emphysema. Right carotid artery calcification.   Electronically Signed   By: Lowella Grip M.D.   On: 03/12/2014 15:19    Medications: Scheduled Meds: . aspirin EC  81 mg Oral Daily  . azithromycin  500 mg Intravenous Q24H  . calcitRIOL  0.25 mcg Oral Daily  . carvedilol  25 mg Oral BID WC  . cefTRIAXone (ROCEPHIN)  IV  1 g Intravenous Q24H  . furosemide  80 mg Oral Daily  . heparin  5,000 Units Subcutaneous 3 times per day  . insulin aspart  0-15 Units Subcutaneous TID WC  . insulin aspart  0-5 Units Subcutaneous QHS  . LORazepam  0.5 mg Oral BID  . mirtazapine  7.5 mg Oral QHS      LOS: 1 day   Teneil Shiller M.D. [facility] 03/13/2014, 11:25 AM Pager: [phone]  If 7PM-7AM, please contact night-coverage [email or web address] Password [email or web address]

## 2014-03-13 NOTE — Care Management Note (Addendum)
    Page 1 of 2   03/14/2014     1:46:55 PM CARE MANAGEMENT NOTE 03/14/2014  Patient:  [name], [name]   Account Number:  [id number]  Date Initiated:  03/13/2014  Documentation initiated by:  Eternity Dexter  Subjective/Objective Assessment:   CAP right middle lobe  Hx/o Left colostomy     Action/Plan:   CM to follow for disposition needs   Anticipated DC Date:  03/16/2014   Anticipated DC Plan:  [place]  CM consult      [facility] Choice  HOME HEALTH   Choice offered to / List presented to:  C-1 Patient        [place] arranged  HH-1 RN  [place]      [place].   Status of service:  Completed, signed off Medicare Important Message given?  NA - LOS <3 / Initial given by admissions (If response is "NO", the following Medicare IM given date fields will be blank) Date Medicare IM given:   Medicare IM given by:   Date Additional Medicare IM given:   Additional Medicare IM given by:    Discharge Disposition:  [place]  Per UR Regulation:  Reviewed for med. necessity/level of care/duration of stay  If discussed at [place] of Stay Meetings, dates discussed:    Comments:  Colin Ellers RN, BSN, MSHL, CCM  Nurse - Case Manager,  (Unit [place])  [phone]  03/14/2014 CAP right middle lobe Hx/o Left colostomy Social:  80 yo from home with DTR PT RECS:  Home health PT;Supervision/Assistance - 24 hour ([place] / Butch Penny notified) RN, PT, OT Services

## 2014-03-13 NOTE — Progress Notes (Signed)
   03/13/14 1400  Advance Directives (For Healthcare)  Does patient have an advance directive? No  Would patient like information on creating an advanced directive? No - patient declined information  Chaplain responded to spiritual care consult for advanced directive, however PT stated she is not interested in completing one. Pt has a copy of the AD form.   Vanetta Mulders 03/13/2014 2:16 PM

## 2014-03-14 LAB — BASIC METABOLIC PANEL
Anion gap: 6 (ref 5–15)
BUN: 32 mg/dL — ABNORMAL HIGH (ref 6–23)
CO2: 29 mmol/L (ref 19–32)
Calcium: 8.4 mg/dL (ref 8.4–10.5)
Chloride: 103 mEq/L (ref 96–112)
Creatinine, Ser: 2.58 mg/dL — ABNORMAL HIGH (ref 0.50–1.10)
GFR calc Af Amer: 18 mL/min — ABNORMAL LOW (ref >90)
GFR calc non Af Amer: 16 mL/min — ABNORMAL LOW (ref >90)
GLUCOSE: 82 mg/dL (ref 70–99)
POTASSIUM: 3 mmol/L — AB (ref 3.5–5.1)
Sodium: 138 mmol/L (ref 135–145)

## 2014-03-14 LAB — LEGIONELLA ANTIGEN, URINE

## 2014-03-14 LAB — GLUCOSE, CAPILLARY
GLUCOSE-CAPILLARY: 90 mg/dL (ref 70–99)
Glucose-Capillary: 122 mg/dL — ABNORMAL HIGH (ref 70–99)

## 2014-03-14 LAB — CBC
HEMATOCRIT: 30 % — AB (ref 36.0–46.0)
HEMOGLOBIN: 9.5 g/dL — AB (ref 12.0–15.0)
MCH: 28.7 pg (ref 26.0–34.0)
MCHC: 31.7 g/dL (ref 30.0–36.0)
MCV: 90.6 fL (ref 78.0–100.0)
Platelets: 152 10*3/uL (ref 150–400)
RBC: 3.31 MIL/uL — ABNORMAL LOW (ref 3.87–5.11)
RDW: 13.9 % (ref 11.5–15.5)
WBC: 7 10*3/uL (ref 4.0–10.5)

## 2014-03-14 MED ORDER — AZITHROMYCIN 250 MG PO TABS: 250.0000 mg | ORAL_TABLET | Freq: Every day | ORAL | Status: DC

## 2014-03-14 MED ORDER — PROMETHAZINE HCL 12.5 MG PO TABS: 12.5000 mg | ORAL_TABLET | Freq: Four times a day (QID) | ORAL | Status: AC | PRN

## 2014-03-14 MED ORDER — CEFUROXIME AXETIL 500 MG PO TABS: 500.0000 mg | ORAL_TABLET | Freq: Every day | ORAL | Status: DC

## 2014-03-14 MED ORDER — POTASSIUM CHLORIDE CRYS ER 20 MEQ PO TBCR
40.0000 meq | EXTENDED_RELEASE_TABLET | Freq: Every day | ORAL | Status: DC
Start: 2014-03-14 — End: 2014-04-25

## 2014-03-14 MED ORDER — POTASSIUM CHLORIDE CRYS ER 20 MEQ PO TBCR
40.0000 meq | EXTENDED_RELEASE_TABLET | Freq: Every day | ORAL | Status: DC
  Administered 2014-03-14: 40 meq via ORAL
  Filled 2014-03-14: qty 2

## 2014-03-14 MED ORDER — AZITHROMYCIN 500 MG PO TABS
500.0000 mg | ORAL_TABLET | Freq: Once | ORAL | Status: AC
  Administered 2014-03-14: 500 mg via ORAL
  Filled 2014-03-14: qty 1

## 2014-03-14 MED ORDER — CEFUROXIME AXETIL 500 MG PO TABS
500.0000 mg | ORAL_TABLET | Freq: Once | ORAL | Status: AC
  Administered 2014-03-14: 500 mg via ORAL
  Filled 2014-03-14: qty 1

## 2014-03-14 NOTE — Progress Notes (Signed)
Physical Therapy Treatment Patient Details Name: [name] MRN: [id number] DOB: 04-19-1925 Today's Date: 03-30-14    History of Present Illness Adm 03/12/14 with pneumonia PMHx-DM, retinopathy, colon Ca with colostomy, afib, CHF    PT Comments    Patient progressing well with mobility, ambulated on room air without difficulty.   Follow Up Recommendations  Home health PT;Supervision/Assistance - 24 hour     Equipment Recommendations  None recommended by PT    Recommendations for Other Services OT consult     Precautions / Restrictions      Mobility  Bed Mobility Overal bed mobility: Modified Independent             General bed mobility comments: used rail and HOB 20 (did not lower due to decr SaO2 at rest)  Transfers Overall transfer level: Needs assistance Equipment used: None Transfers: Sit to/from Stand Sit to Stand: Supervision         General transfer comment: one step up towards [facility] and return to sitting  Ambulation/Gait Ambulation/Gait assistance: Modified independent (Device/Increase time) Ambulation Distance (Feet): 160 Feet Assistive device: Rolling walker (2 wheeled) Gait Pattern/deviations: Decreased stride length;Drifts right/left Gait velocity: decreased       Stairs            Wheelchair Mobility    Modified Rankin (Stroke Patients Only)       Balance                                    Cognition Arousal/Alertness: Awake/alert Behavior During Therapy: WFL for tasks assessed/performed Overall Cognitive Status: Within Functional Limits for tasks assessed                      Exercises      General Comments General comments (skin integrity, edema, etc.): SpO2 >94%      Pertinent Vitals/Pain Pain Assessment: No/denies pain    Home Living                      Prior Function            PT Goals (current goals can now be found in the care plan section) Acute Rehab PT  Goals Patient Stated Goal: return to her own home PT Goal Formulation: Patient unable to participate in goal setting (vomiting and nauseous) Time For Goal Achievement: 03/20/14 Potential to Achieve Goals: Good Progress towards PT goals: Progressing toward goals    Frequency  Min 3X/week    PT Plan      Co-evaluation             End of Session Equipment Utilized During Treatment: Gait belt Activity Tolerance: Patient tolerated treatment well Patient left: in chair;with call bell/phone within reach     Time: 1009-1030 PT Time Calculation (min) (ACUTE ONLY): 21 min  Charges:  $Gait Training: 8-22 mins                    G CodesDuncan Dull 2014-03-30, 4:04 PM Alben Deeds, [place] DPT  [phone]

## 2014-03-14 NOTE — Progress Notes (Signed)
Discharge instructions reviewed with patient/daughter. RXs given to patient/daughter. All questions answered at this time. Transport by daughter.   Ave Filter, RN

## 2014-03-14 NOTE — Discharge Summary (Signed)
Physician Discharge Summary  Patient ID: HOLLIS OH MRN: [id number] DOB/AGE: Oct 25, 1925 80 y.o.  Admit date: 03/12/2014 Discharge date: 03/14/2014  Primary Care Physician:  Abigail Miyamoto, MD  Discharge Diagnoses:    . CAP (community acquired pneumonia) . Chronic atrial fibrillation . Chronic diastolic CHF (congestive heart failure), NYHA class 2 . CKD (chronic kidney disease) stage 4, GFR 15-29 ml/min . Hypertension  Consults:  None  Recommendations for Outpatient Follow-up:  Please obtain chest x-ray in 2-3 weeks to ensure complete resolution of pneumonia  TESTS THAT NEED FOLLOW-UP Please check BMET, CBC at the time of follow-up appointment   DIET: Carb modified diet    Allergies:   Allergies  Allergen Reactions  . Codeine Nausea Only  . Keflet [Cephalexin] Nausea Only  . Levaquin [Levofloxacin In D5w] Nausea Only  . Morphine And Related Nausea Only  . Oxycodone Other (See Comments)    Abnormal behavior     Discharge Medications:   Medication List    TAKE these medications        acetaminophen 500 MG tablet  Commonly known as:  TYLENOL  Take 500 mg by mouth every 6 (six) hours as needed for pain.     aspirin EC 81 MG tablet  Take 81 mg by mouth daily.     azithromycin 250 MG tablet  Commonly known as:  ZITHROMAX  Take 1 tablet (250 mg total) by mouth daily. X 7 days     calcitRIOL 0.25 MCG capsule  Commonly known as:  ROCALTROL  Take 0.25 mcg by mouth daily.     carvedilol 25 MG tablet  Commonly known as:  COREG  TAKE 1 TABLET (25 MG TOTAL) BY MOUTH 2 (TWO) TIMES DAILY WITH A MEAL.     cefUROXime 500 MG tablet  Commonly known as:  CEFTIN  Take 1 tablet (500 mg total) by mouth daily. X 7days     docusate sodium 100 MG capsule  Commonly known as:  COLACE  Take 100 mg by mouth daily as needed for constipation.     furosemide 80 MG tablet  Commonly known as:  LASIX  TAKE 1 TABLET BY MOUTH DAILY, TAKE AN EXTRA 1/2 TABLET IN THE AFTERNOON  ON MONDAY,WEDNESDAY,&FRIDAY     insulin glargine 100 UNIT/ML injection  Commonly known as:  LANTUS  Inject 8 Units into the skin at bedtime as needed (if cbg over 150).     linagliptin 5 MG Tabs tablet  Commonly known as:  TRADJENTA  Take 5 mg by mouth daily.     LORazepam 0.5 MG tablet  Commonly known as:  ATIVAN  Take 0.5 mg by mouth 2 (two) times daily.     meclizine 12.5 MG tablet  Commonly known as:  ANTIVERT  Take 12.5 mg by mouth as needed for dizziness.     mirtazapine 7.5 MG tablet  Commonly known as:  REMERON  Take 7.5 mg by mouth at bedtime.     phenazopyridine 100 MG tablet  Commonly known as:  PYRIDIUM  Take 100 mg by mouth as needed for pain.     potassium chloride SA 20 MEQ tablet  Commonly known as:  K-DUR,KLOR-CON  Take 2 tablets (40 mEq total) by mouth daily.     promethazine 12.5 MG tablet  Commonly known as:  PHENERGAN  Take 1 tablet (12.5 mg total) by mouth every 6 (six) hours as needed for nausea or vomiting.     vitamin B-12 1000 MCG tablet  Commonly  known as:  CYANOCOBALAMIN  Take 1,000 mcg by mouth daily.         Brief H and P: For complete details please refer to admission H and P, but in brief  Patient is a 80 year old female with diabetes, aortic stenosis, hypertension, 8 of ablation, diastolic CHF presented with progressively worsening shortness of breath with cough. Patient reported cold-like symptoms for the last weeks, runny nose sneezing. However in the last 3 days she was having increasingly worsening cough, productive with yellowish expectoration. Chest x-ray showed areas of consolidation in the right middle lobe and right lower lobe with underlying emphysema. Patient was admitted for further workup.   Hospital Course:  CAP (community acquired pneumonia)/right middle lobe and right lower lobe Feeling a whole lot improved, patient's daughter did not report any aspiration issues. She was placed on IV Zithromax and Rocephin. Afebrile,  no leukocytosis. Urine strep antigen negative. Blood cultures has been negative so far. Patient feels close to her baseline status. PT evaluation recommended home PT with supervision. Discussed in detail with patient's daughter, Judson Roch who reported that patient will be with her daughters. Also arranged home health PT, OT, RN for follow-up. Please check chest x-ray, CBC, BMET at the time of follow-up appointment.   Diabetes mellitus, type II Continue insulin per home regimen   Hypertension - Currently stable   CKD (chronic kidney disease) stage 4, GFR 15-29 ml/min - Patient follows Dr. Rolan Lipa, creatinine function currently at baseline. Recommended follow-up with nephrology for the follow-up   Chronic diastolic CHF (congestive heart failure), NYHA class 2 -Currently euvolemic, continue home medications and Lasix   Day of Discharge BP 132/44 mmHg  Pulse 65  Temp(Src) 98.1 F (36.7 C) (Oral)  Resp 18  Ht 5\' 3"  (1.6 m)  Wt 79.32 kg (174 lb 13.9 oz)  BMI 30.98 kg/m2  SpO2 92%  Physical Exam: General: Alert and awake oriented x3 not in any acute distress. CVS: S1-S2 clear no murmur rubs or gallops Chest: clear to auscultation bilaterally, no wheezing or rhonchi Abdomen: soft nontender, nondistended, normal bowel sounds Extremities: no cyanosis, clubbing or edema noted bilaterally Neuro: Cranial nerves II-XII intact, no focal neurological deficits   The results of significant diagnostics from this hospitalization (including imaging, microbiology, ancillary and laboratory) are listed below for reference.    LAB RESULTS: Basic Metabolic Panel:  Recent Labs Lab 03/13/14 0407 03/14/14 0425  NA 139 138  K 3.0* 3.0*  CL 102 103  CO2 21 29  GLUCOSE 88 82  BUN 33* 32*  CREATININE 2.30* 2.58*  CALCIUM 8.7 8.4   Liver Function Tests:  Recent Labs Lab 03/13/14 0407  AST 11  ALT <5  ALKPHOS 161*  BILITOT 0.4  PROT 5.6*  ALBUMIN 2.5*   No results for  input(s): LIPASE, AMYLASE in the last 168 hours. No results for input(s): AMMONIA in the last 168 hours. CBC:  Recent Labs Lab 03/13/14 0407 03/14/14 0425  WBC 9.1 7.0  NEUTROABS 6.8  --   HGB 9.4* 9.5*  HCT 30.6* 30.0*  MCV 90.0 90.6  PLT 145* 152   Cardiac Enzymes: No results for input(s): CKTOTAL, CKMB, CKMBINDEX, TROPONINI in the last 168 hours. BNP: Invalid input(s): POCBNP CBG:  Recent Labs Lab 03/13/14 2047 03/14/14 0612  GLUCAP 118* 90    Significant Diagnostic Studies:  Dg Chest 2 View  03/12/2014   CLINICAL DATA:  Productive cough for 1 day. Cough for 1 week. History of colon carcinoma  EXAM: CHEST  2 VIEW  COMPARISON:  November 03, 2012  FINDINGS: There is consolidation in the right middle lobe. There is also some consolidation in portions of the right lower lobe. There is a small right effusion. There is underlying emphysema. The left lung is clear. Heart is upper normal in size with pulmonary vascularity within normal limits. No adenopathy. There is calcification in the right carotid artery.  IMPRESSION: Areas of consolidation in the right middle lobe and to a lesser extent right lower lobe. Left lung clear. Underlying emphysema. Right carotid artery calcification.   Electronically Signed   By: Lowella Grip M.D.   On: 03/12/2014 15:19    2D ECHO:   Disposition and Follow-up: Discharge Instructions    Diet Carb Modified    Complete by:  As directed      Increase activity slowly    Complete by:  As directed             DISPOSITION: Home health PT, OT, RN   DISCHARGE FOLLOW-UP Follow-up Information    Follow up with FRIED, ROBERT L, MD. Schedule an appointment as soon as possible for a visit in 2 weeks.   Specialty:  Family Medicine   Why:  for hospital follow-up   Contact information:   [place] [address] [phone]       Follow up with GOLDSBOROUGH,KELLIE A, MD. Schedule an appointment as soon as possible for a visit in 2  weeks.   Specialty:  Nephrology   Why:  for hospital follow-up   Contact information:   [place] [address] [phone]        Time spent on Discharge: 35 minutes  Signed:   RAI,RIPUDEEP M.D. [facility] 03/14/2014, 11:22 AM Pager: [phone]

## 2014-03-15 ENCOUNTER — Encounter (HOSPITAL_COMMUNITY): Payer: Self-pay | Admitting: Family Medicine

## 2014-03-15 ENCOUNTER — Emergency Department (HOSPITAL_COMMUNITY): Payer: Medicare Other

## 2014-03-15 ENCOUNTER — Inpatient Hospital Stay (HOSPITAL_COMMUNITY)
Admission: EM | Admit: 2014-03-15 | Discharge: 2014-03-18 | DRG: 291 | Disposition: A | Discharge status: Home Health | Payer: Medicare Other | Attending: Internal Medicine | Admitting: Internal Medicine

## 2014-03-15 DIAGNOSIS — Z8249 Family history of ischemic heart disease and other diseases of the circulatory system: Secondary | ICD-10-CM | POA: Diagnosis not present

## 2014-03-15 DIAGNOSIS — Z7982 Long term (current) use of aspirin: Secondary | ICD-10-CM

## 2014-03-15 DIAGNOSIS — Y95 Nosocomial condition: Secondary | ICD-10-CM | POA: Diagnosis present

## 2014-03-15 DIAGNOSIS — R0602 Shortness of breath: Secondary | ICD-10-CM

## 2014-03-15 DIAGNOSIS — I350 Nonrheumatic aortic (valve) stenosis: Secondary | ICD-10-CM | POA: Diagnosis present

## 2014-03-15 DIAGNOSIS — Z833 Family history of diabetes mellitus: Secondary | ICD-10-CM

## 2014-03-15 DIAGNOSIS — Z794 Long term (current) use of insulin: Secondary | ICD-10-CM

## 2014-03-15 DIAGNOSIS — J9690 Respiratory failure, unspecified, unspecified whether with hypoxia or hypercapnia: Secondary | ICD-10-CM | POA: Diagnosis present

## 2014-03-15 DIAGNOSIS — I129 Hypertensive chronic kidney disease with stage 1 through stage 4 chronic kidney disease, or unspecified chronic kidney disease: Secondary | ICD-10-CM | POA: Diagnosis present

## 2014-03-15 DIAGNOSIS — J9621 Acute and chronic respiratory failure with hypoxia: Secondary | ICD-10-CM | POA: Diagnosis present

## 2014-03-15 DIAGNOSIS — J189 Pneumonia, unspecified organism: Secondary | ICD-10-CM | POA: Diagnosis present

## 2014-03-15 DIAGNOSIS — Z809 Family history of malignant neoplasm, unspecified: Secondary | ICD-10-CM | POA: Diagnosis not present

## 2014-03-15 DIAGNOSIS — F419 Anxiety disorder, unspecified: Secondary | ICD-10-CM | POA: Diagnosis present

## 2014-03-15 DIAGNOSIS — I5033 Acute on chronic diastolic (congestive) heart failure: Principal | ICD-10-CM | POA: Diagnosis present

## 2014-03-15 DIAGNOSIS — R0902 Hypoxemia: Secondary | ICD-10-CM

## 2014-03-15 DIAGNOSIS — N184 Chronic kidney disease, stage 4 (severe): Secondary | ICD-10-CM | POA: Diagnosis present

## 2014-03-15 DIAGNOSIS — Z881 Allergy status to other antibiotic agents status: Secondary | ICD-10-CM

## 2014-03-15 DIAGNOSIS — E11319 Type 2 diabetes mellitus with unspecified diabetic retinopathy without macular edema: Secondary | ICD-10-CM | POA: Diagnosis present

## 2014-03-15 DIAGNOSIS — Z888 Allergy status to other drugs, medicaments and biological substances status: Secondary | ICD-10-CM

## 2014-03-15 DIAGNOSIS — N189 Chronic kidney disease, unspecified: Secondary | ICD-10-CM

## 2014-03-15 DIAGNOSIS — E118 Type 2 diabetes mellitus with unspecified complications: Secondary | ICD-10-CM

## 2014-03-15 DIAGNOSIS — I482 Chronic atrial fibrillation: Secondary | ICD-10-CM | POA: Diagnosis present

## 2014-03-15 DIAGNOSIS — I10 Essential (primary) hypertension: Secondary | ICD-10-CM | POA: Diagnosis present

## 2014-03-15 DIAGNOSIS — Z90710 Acquired absence of both cervix and uterus: Secondary | ICD-10-CM

## 2014-03-15 DIAGNOSIS — E1122 Type 2 diabetes mellitus with diabetic chronic kidney disease: Secondary | ICD-10-CM

## 2014-03-15 LAB — COMPREHENSIVE METABOLIC PANEL
ALT: 7 U/L (ref 0–35)
AST: 15 U/L (ref 0–37)
Albumin: 3.1 g/dL — ABNORMAL LOW (ref 3.5–5.2)
Alkaline Phosphatase: 160 U/L — ABNORMAL HIGH (ref 39–117)
Anion gap: 12 (ref 5–15)
BUN: 35 mg/dL — ABNORMAL HIGH (ref 6–23)
CALCIUM: 8.9 mg/dL (ref 8.4–10.5)
CO2: 23 mmol/L (ref 19–32)
CREATININE: 2.76 mg/dL — AB (ref 0.50–1.10)
Chloride: 104 mEq/L (ref 96–112)
GFR calc Af Amer: 17 mL/min — ABNORMAL LOW (ref >90)
GFR, EST NON AFRICAN AMERICAN: 15 mL/min — AB (ref >90)
Glucose, Bld: 146 mg/dL — ABNORMAL HIGH (ref 70–99)
Potassium: 4.1 mmol/L (ref 3.5–5.1)
SODIUM: 139 mmol/L (ref 135–145)
Total Bilirubin: 0.3 mg/dL (ref 0.3–1.2)
Total Protein: 6.2 g/dL (ref 6.0–8.3)

## 2014-03-15 LAB — PROTIME-INR
INR: 1.2 (ref 0.00–1.49)
Prothrombin Time: 15.4 seconds — ABNORMAL HIGH (ref 11.6–15.2)

## 2014-03-15 LAB — I-STAT TROPONIN, ED: TROPONIN I, POC: 0.02 ng/mL (ref 0.00–0.08)

## 2014-03-15 LAB — I-STAT CG4 LACTIC ACID, ED: LACTIC ACID, VENOUS: 0.62 mmol/L (ref 0.5–2.2)

## 2014-03-15 LAB — CBC WITH DIFFERENTIAL/PLATELET
BASOS ABS: 0 10*3/uL (ref 0.0–0.1)
BASOS PCT: 0 % (ref 0–1)
Eosinophils Absolute: 0 10*3/uL (ref 0.0–0.7)
Eosinophils Relative: 0 % (ref 0–5)
HCT: 35.3 % — ABNORMAL LOW (ref 36.0–46.0)
Hemoglobin: 11 g/dL — ABNORMAL LOW (ref 12.0–15.0)
LYMPHS PCT: 12 % (ref 12–46)
Lymphs Abs: 1.1 10*3/uL (ref 0.7–4.0)
MCH: 28.7 pg (ref 26.0–34.0)
MCHC: 31.2 g/dL (ref 30.0–36.0)
MCV: 92.2 fL (ref 78.0–100.0)
MONO ABS: 0.4 10*3/uL (ref 0.1–1.0)
Monocytes Relative: 5 % (ref 3–12)
Neutro Abs: 7.3 10*3/uL (ref 1.7–7.7)
Neutrophils Relative %: 83 % — ABNORMAL HIGH (ref 43–77)
Platelets: 184 10*3/uL (ref 150–400)
RBC: 3.83 MIL/uL — ABNORMAL LOW (ref 3.87–5.11)
RDW: 14 % (ref 11.5–15.5)
WBC: 8.9 10*3/uL (ref 4.0–10.5)

## 2014-03-15 LAB — GLUCOSE, CAPILLARY
GLUCOSE-CAPILLARY: 126 mg/dL — AB (ref 70–99)
Glucose-Capillary: 86 mg/dL (ref 70–99)

## 2014-03-15 LAB — STREP PNEUMONIAE URINARY ANTIGEN: Strep Pneumo Urinary Antigen: NEGATIVE

## 2014-03-15 LAB — BRAIN NATRIURETIC PEPTIDE: B NATRIURETIC PEPTIDE 5: 941.4 pg/mL — AB (ref 0.0–100.0)

## 2014-03-15 MED ORDER — INSULIN GLARGINE 100 UNIT/ML ~~LOC~~ SOLN
8.0000 [IU] | Freq: Every evening | SUBCUTANEOUS | Status: DC | PRN
  Filled 2014-03-15: qty 0.08

## 2014-03-15 MED ORDER — PIPERACILLIN-TAZOBACTAM 3.375 G IVPB 30 MIN
3.3750 g | Freq: Once | INTRAVENOUS | Status: AC
  Administered 2014-03-15: 3.375 g via INTRAVENOUS
  Filled 2014-03-15: qty 50

## 2014-03-15 MED ORDER — INSULIN ASPART 100 UNIT/ML ~~LOC~~ SOLN
0.0000 [IU] | Freq: Every day | SUBCUTANEOUS | Status: DC
Start: 2014-03-15 — End: 2014-03-18

## 2014-03-15 MED ORDER — MIRTAZAPINE 7.5 MG PO TABS
7.5000 mg | ORAL_TABLET | Freq: Every day | ORAL | Status: DC
  Administered 2014-03-15 – 2014-03-17 (×3): 7.5 mg via ORAL
  Filled 2014-03-15 (×4): qty 1

## 2014-03-15 MED ORDER — DOCUSATE SODIUM 100 MG PO CAPS
100.0000 mg | ORAL_CAPSULE | Freq: Every day | ORAL | Status: DC | PRN
  Filled 2014-03-15: qty 1

## 2014-03-15 MED ORDER — VANCOMYCIN HCL IN DEXTROSE 1-5 GM/200ML-% IV SOLN: 1000.0000 mg | INTRAVENOUS | Status: DC

## 2014-03-15 MED ORDER — POTASSIUM CHLORIDE CRYS ER 20 MEQ PO TBCR
40.0000 meq | EXTENDED_RELEASE_TABLET | Freq: Every day | ORAL | Status: DC
  Administered 2014-03-15 – 2014-03-18 (×4): 40 meq via ORAL
  Filled 2014-03-15 (×4): qty 2

## 2014-03-15 MED ORDER — LINAGLIPTIN 5 MG PO TABS
5.0000 mg | ORAL_TABLET | Freq: Every day | ORAL | Status: DC
  Administered 2014-03-15 – 2014-03-18 (×4): 5 mg via ORAL
  Filled 2014-03-15 (×4): qty 1

## 2014-03-15 MED ORDER — SODIUM CHLORIDE 0.9 % IJ SOLN: 3.0000 mL | INTRAMUSCULAR | Status: DC | PRN

## 2014-03-15 MED ORDER — HEPARIN SODIUM (PORCINE) 5000 UNIT/ML IJ SOLN
5000.0000 [IU] | Freq: Three times a day (TID) | INTRAMUSCULAR | Status: DC
  Administered 2014-03-15 – 2014-03-18 (×9): 5000 [IU] via SUBCUTANEOUS
  Filled 2014-03-15 (×11): qty 1

## 2014-03-15 MED ORDER — LORAZEPAM 0.5 MG PO TABS
0.5000 mg | ORAL_TABLET | Freq: Four times a day (QID) | ORAL | Status: DC | PRN
  Administered 2014-03-15 – 2014-03-17 (×3): 0.5 mg via ORAL
  Filled 2014-03-15 (×3): qty 1

## 2014-03-15 MED ORDER — ASPIRIN 81 MG PO CHEW
324.0000 mg | CHEWABLE_TABLET | Freq: Once | ORAL | Status: AC
  Administered 2014-03-15: 324 mg via ORAL
  Filled 2014-03-15: qty 4

## 2014-03-15 MED ORDER — CALCITRIOL 0.25 MCG PO CAPS
0.2500 ug | ORAL_CAPSULE | Freq: Every day | ORAL | Status: DC
  Administered 2014-03-15 – 2014-03-18 (×4): 0.25 ug via ORAL
  Filled 2014-03-15 (×4): qty 1

## 2014-03-15 MED ORDER — CARVEDILOL 12.5 MG PO TABS
12.5000 mg | ORAL_TABLET | Freq: Two times a day (BID) | ORAL | Status: DC
  Administered 2014-03-15 – 2014-03-18 (×6): 12.5 mg via ORAL
  Filled 2014-03-15 (×8): qty 1

## 2014-03-15 MED ORDER — SODIUM CHLORIDE 0.9 % IJ SOLN
3.0000 mL | Freq: Two times a day (BID) | INTRAMUSCULAR | Status: DC
  Administered 2014-03-15 – 2014-03-18 (×7): 3 mL via INTRAVENOUS

## 2014-03-15 MED ORDER — FUROSEMIDE 10 MG/ML IJ SOLN
40.0000 mg | Freq: Once | INTRAMUSCULAR | Status: AC
  Administered 2014-03-15: 40 mg via INTRAVENOUS
  Filled 2014-03-15: qty 4

## 2014-03-15 MED ORDER — ONDANSETRON HCL 4 MG/2ML IJ SOLN
4.0000 mg | Freq: Four times a day (QID) | INTRAMUSCULAR | Status: DC | PRN
  Administered 2014-03-15 – 2014-03-18 (×2): 4 mg via INTRAVENOUS
  Filled 2014-03-15 (×2): qty 2

## 2014-03-15 MED ORDER — ASPIRIN EC 81 MG PO TBEC
81.0000 mg | DELAYED_RELEASE_TABLET | Freq: Every day | ORAL | Status: DC
Start: 2014-03-15 — End: 2014-03-18
  Administered 2014-03-15 – 2014-03-18 (×4): 81 mg via ORAL
  Filled 2014-03-15 (×4): qty 1

## 2014-03-15 MED ORDER — VANCOMYCIN HCL IN DEXTROSE 1-5 GM/200ML-% IV SOLN
1000.0000 mg | Freq: Once | INTRAVENOUS | Status: AC
  Administered 2014-03-15: 1000 mg via INTRAVENOUS
  Filled 2014-03-15: qty 200

## 2014-03-15 MED ORDER — PIPERACILLIN-TAZOBACTAM IN DEX 2-0.25 GM/50ML IV SOLN
2.2500 g | Freq: Four times a day (QID) | INTRAVENOUS | Status: DC
  Filled 2014-03-15: qty 50

## 2014-03-15 MED ORDER — SODIUM CHLORIDE 0.9 % IV SOLN: 250.0000 mL | INTRAVENOUS | Status: DC | PRN

## 2014-03-15 MED ORDER — FUROSEMIDE 10 MG/ML IJ SOLN
60.0000 mg | Freq: Two times a day (BID) | INTRAMUSCULAR | Status: DC
  Administered 2014-03-15 – 2014-03-18 (×6): 60 mg via INTRAVENOUS
  Filled 2014-03-15 (×8): qty 6

## 2014-03-15 MED ORDER — INSULIN ASPART 100 UNIT/ML ~~LOC~~ SOLN
0.0000 [IU] | Freq: Three times a day (TID) | SUBCUTANEOUS | Status: DC
  Administered 2014-03-15: 2 [IU] via SUBCUTANEOUS
  Administered 2014-03-16 – 2014-03-17 (×2): 3 [IU] via SUBCUTANEOUS
  Administered 2014-03-17: 2 [IU] via SUBCUTANEOUS

## 2014-03-15 MED ORDER — CEFEPIME HCL 1 G IJ SOLR
1.0000 g | INTRAMUSCULAR | Status: DC
  Administered 2014-03-15: 1 g via INTRAVENOUS
  Filled 2014-03-15 (×2): qty 1

## 2014-03-15 NOTE — ED Notes (Signed)
Patient in Xray

## 2014-03-15 NOTE — Progress Notes (Signed)
ANTIBIOTIC CONSULT NOTE - INITIAL  Pharmacy Consult for vancomycin + zosyn Indication: rule out pneumonia  Allergies  Allergen Reactions  . Codeine Nausea Only  . Keflet [Cephalexin] Nausea Only  . Levaquin [Levofloxacin In D5w] Nausea Only  . Morphine And Related Nausea Only  . Oxycodone Other (See Comments)    Abnormal behavior    Patient Measurements:   Adjusted Body Weight:   Vital Signs: Temp: 98.3 F (36.8 C) (12/23 0908) Temp Source: Oral (12/23 0908) BP: 171/68 mmHg (12/23 1015) Pulse Rate: 68 (12/23 1015) Intake/Output from previous day:   Intake/Output from this shift:    Labs:  Recent Labs  03/13/14 0407 03/14/14 0425 03/15/14 0914  WBC 9.1 7.0 8.9  HGB 9.4* 9.5* 11.0*  PLT 145* 152 184  CREATININE 2.30* 2.58* 2.76*   Estimated Creatinine Clearance: 14.6 mL/min (by C-G formula based on Cr of 2.76). No results for input(s): VANCOTROUGH, VANCOPEAK, VANCORANDOM, GENTTROUGH, GENTPEAK, GENTRANDOM, TOBRATROUGH, TOBRAPEAK, TOBRARND, AMIKACINPEAK, AMIKACINTROU, AMIKACIN in the last 72 hours.   Microbiology: Recent Results (from the past 720 hour(s))  Culture, blood (routine x 2) Call MD if unable to obtain prior to antibiotics being given     Status: None (Preliminary result)   Collection Time: 03/13/14 12:35 AM  Result Value Ref Range Status   Specimen Description BLOOD LEFT ARM  Final   Special Requests BOTTLES DRAWN AEROBIC AND ANAEROBIC [facility] EACH  Final   Culture  Setup Time   Final    03/13/2014 10:18 Performed at [facility]    Culture   Final           BLOOD CULTURE RECEIVED NO GROWTH TO DATE CULTURE WILL BE HELD FOR 5 DAYS BEFORE ISSUING A FINAL NEGATIVE REPORT Performed at [facility]    Report Status PENDING  Incomplete  Culture, blood (routine x 2) Call MD if unable to obtain prior to antibiotics being given     Status: None (Preliminary result)   Collection Time: 03/13/14 12:38 AM  Result Value Ref Range Status   Specimen  Description BLOOD RIGHT ARM  Final   Special Requests BOTTLES DRAWN AEROBIC ONLY 5CC  Final   Culture  Setup Time   Final    03/13/2014 10:18 Performed at [facility]    Culture   Final           BLOOD CULTURE RECEIVED NO GROWTH TO DATE CULTURE WILL BE HELD FOR 5 DAYS BEFORE ISSUING A FINAL NEGATIVE REPORT Performed at [facility]    Report Status PENDING  Incomplete  Culture, sputum-assessment     Status: None   Collection Time: 03/13/14  7:25 AM  Result Value Ref Range Status   Specimen Description SPUTUM  Final   Special Requests NONE  Final   Sputum evaluation   Final    MICROSCOPIC FINDINGS SUGGEST THAT THIS SPECIMEN IS NOT REPRESENTATIVE OF LOWER RESPIRATORY SECRETIONS. PLEASE RECOLLECT. Gram Stain Report Called to,Read Back By and Verified With: NELLIE RN AT [phone] 03/13/14 BY K BARR    Report Status 03/13/2014 FINAL  Final    Medical History: Past Medical History  Diagnosis Date  . Renal insufficiency, mild   . Anxiety   . Diabetes mellitus     diabetic retinopathy  . Anemia   . Aortic stenosis, mild   . Urinary tract infection   . Fall at home   . Colostomy care     colon ca  . Colon cancer 30 year ago  .  Chronic atrial fibrillation     not on anticoagulation due to increased risk of falls  . Diastolic dysfunction   . Chronic diastolic CHF (congestive heart failure)   . Hypertension     tracy turner    Medications:  Anti-infectives    Start     Dose/Rate Route Frequency Ordered Stop   03/17/14 1200  vancomycin (VANCOCIN) IVPB 1000 mg/200 mL premix     1,000 mg200 mL/hr over 60 Minutes Intravenous Every 48 hours 03/15/14 1041     03/15/14 1800  piperacillin-tazobactam (ZOSYN) IVPB 2.25 g     2.25 g100 mL/hr over 30 Minutes Intravenous 4 times per day 03/15/14 1041     03/15/14 1045  piperacillin-tazobactam (ZOSYN) IVPB 3.375 g     3.375 g100 mL/hr over 30 Minutes Intravenous  Once 03/15/14 1038     03/15/14 1045  vancomycin (VANCOCIN) IVPB  1000 mg/200 mL premix     1,000 mg200 mL/hr over 60 Minutes Intravenous  Once 03/15/14 1038       Assessment: 64 yof recently discharged on azithromycin and cefuroxime 12/22, presents to the ED with SOB. To broaden antibiotics to vancomycin and zosyn. Pt is currently afebrile and WBC is WNL. Known history of CKD with elevated Scr of 2.76. This has been trending up the last few days. Cultures from most recent admission were negative.   Vanc 12/23>> Zosyn 12/23>>  Goal of Therapy:  Vancomycin trough level 15-20 mcg/ml  Plan:  1. Vancomycin 1gm IV Q48H 2. Zosyn 3.375gm IV x 1 then 2.25gm IV Q6H 3. F/u renal fxn, C&S, clinical status and trough at [place], Rande Lawman 03/15/2014,10:41 AM

## 2014-03-15 NOTE — Care Management Note (Addendum)
    Page 1 of 1   03/16/2014     12:58:59 PM CARE MANAGEMENT NOTE 03/16/2014  Patient:  [name]   Account Number:  [id number]  Date Initiated:  03/15/2014  Documentation initiated by:  [name]  Subjective/Objective Assessment:   HCAP and CHF     Action/Plan:   CM to follow for disposition needs   Anticipated DC Date:  03/18/2014   Anticipated DC Plan:  [place]         Choice offered to / List presented to:             Status of service:  Completed, signed off Medicare Important Message given?  YES (If response is "NO", the following Medicare IM given date fields will be blank) Date Medicare IM given:  03/16/2014 Medicare IM given by:  [name] Date Additional Medicare IM given:   Additional Medicare IM given by:    Discharge Disposition:  [place]  Per UR Regulation:  Reviewed for med. necessity/level of care/duration of stay  If discussed at Long Length of Stay Meetings, dates discussed:    Comments:  Natasha Konecny RN, BSN, MSHL, CCM  Nurse - Case Manager,  (Unit [place])  [phone]  03/16/2014 HCAP, CHF, Hx/o Left colostomy Social:  80 yo from home with DTR HHS:  Patient d/c home 03/14/14 with HHS:  [facility] RN, PT, OT services PT Recs:  Pending Disposition Plan:  Resume HHS at discharge:  [facility]/[name] notified

## 2014-03-15 NOTE — ED Notes (Signed)
Pt in from home via [facility], per report pt was d/c from here yesterday with pneumonia dx, pt called EMS c/o SOB & n/v onset yesterday upon her return home, pt denies diarrhea, pt A&O x4, follows commands, speaks in complete sentences, c/o L sided rib cage pain, pt in A fib in route with hx of a fib

## 2014-03-15 NOTE — Progress Notes (Signed)
UR completed Bently Morath K. Evyn Putzier, RN, BSN, [place], CCM  03/15/2014 12:20 PM

## 2014-03-15 NOTE — H&P (Signed)
[facility] History and Physical  PERIAN TEDDER KGM:[id number] DOB: 1925-07-08 DOA: 03/15/2014  Referring physician:  PCP: Abigail Miyamoto, MD   Chief Complaint: Shortness of breath  HPI: [name] is a 80 y.o. female with a past medical history of chronic atrial fibrillation, chronic diastolic congestive heart failure, stage IV chronic kidney disease who was recently admitted to the medicine service on 03/12/2014 and discharged on 03/14/2014 at which time she was treated for a community-acquired pneumonia. She was discharged on cefuroxime 500 mg by mouth daily along with a azithromycin 250 mg by mouth daily. Chest x-ray on 03/12/2014 had shown areas of consolidation in the right middle lobe and to a lesser extent right lower lobe. Patient doing poorly at home as she had progressively worsening shortness of breath associated with cough and yellow to green sputum production. She complains of generalized weakness, malaise, poor tolerance to physical exertion. She reported that symptoms significantly worsened by 3:00 this morning. Per a emergency room staff she was found to be in respiratory distress, having oxygen saturations in the mid-80s and required 3 L of oxygen via nasal cannula.  A repeat chest x-ray on admission revealed interval development of opacity in the left lower lobe with persistent infiltrate in the right lower lobe. There was also superimposed mild CHF.                                                                                                                                                                                      Review of Systems:  Constitutional:  No weight loss, night sweats, Fevers, chills, positive for fatigue and generalized weakness.  HEENT:  No headaches, Difficulty swallowing,Tooth/dental problems,Sore throat,  No sneezing, itching, ear ache, nasal congestion, post nasal drip,  Cardio-vascular:  No chest pain, Orthopnea, PND, swelling in lower  extremities, anasarca, dizziness, palpitations  GI:  No heartburn, indigestion, abdominal pain, nausea, vomiting, diarrhea, change in bowel habits, loss of appetite  Resp:  Positive for shortness of breath with exertion or at rest. No excess mucus, no productive cough, No non-productive cough, No coughing up of blood.No change in color of mucus.No wheezing.No chest wall deformity  Skin:  no rash or lesions.  GU:  no dysuria, change in color of urine, no urgency or frequency. No flank pain.  Musculoskeletal:  No joint pain or swelling. No decreased range of motion. No back pain.  Psych:  No change in mood or affect. No depression or anxiety. No memory loss.   Past Medical History  Diagnosis Date  . Renal insufficiency, mild   . Anxiety   . Diabetes mellitus     diabetic retinopathy  .  Anemia   . Aortic stenosis, mild   . Urinary tract infection   . Fall at home   . Colostomy care     colon ca  . Colon cancer 30 year ago  . Chronic atrial fibrillation     not on anticoagulation due to increased risk of falls  . Diastolic dysfunction   . Chronic diastolic CHF (congestive heart failure)   . Hypertension     [name]   Past Surgical History  Procedure Laterality Date  . Abdominal surgery    . Abdominal hysterectomy    . Colon surgery    . Appendectomy    . Colostomy    . Cardioversion  09/30/2011    Procedure: CARDIOVERSION;  Surgeon: [name], MD;  Location: [place];  Service: Cardiovascular;  Laterality: N/A;  . I&d extremity  11/20/2011    Procedure: IRRIGATION AND DEBRIDEMENT EXTREMITY;  Surgeon: Alta Corning, MD;  Location: [place];  Service: Orthopedics;  Laterality: Left;  . Av fistula placement Left 06/02/2012    Procedure: ARTERIOVENOUS (AV) FISTULA CREATION;  Surgeon: Conrad Yachats, MD;  Location: [place];  Service: Vascular;  Laterality: Left;  Bascilic Fistula  . Bascilic vein transposition Left 08/18/2012    Procedure: BASCILIC VEIN TRANSPOSITION;  Surgeon:  Conrad Biddeford, MD;  Location: [place];  Service: Vascular;  Laterality: Left;  Left 2nd stage Basilic Vein Transposition    Social History:  reports that she has never smoked. Her smokeless tobacco use includes Snuff. She reports that she does not drink alcohol or use illicit drugs.  Allergies  Allergen Reactions  . Codeine Nausea Only  . Keflet [Cephalexin] Nausea Only  . Levaquin [Levofloxacin In D5w] Nausea Only  . Morphine And Related Nausea Only  . Oxycodone Other (See Comments)    Abnormal behavior    Family History  Problem Relation Age of Onset  . Heart disease Father   . Diabetes Sister   . Diabetes Daughter   . Hyperlipidemia Daughter   . Hypertension Daughter   . Other Daughter     varicose veins  . Cancer Son   . Diabetes Son   . Heart disease Son     before age 35  . Hyperlipidemia Son   . Hypertension Son      Prior to Admission medications   Medication Sig Start Date End Date Taking? Authorizing Provider  acetaminophen (TYLENOL) 500 MG tablet Take 500 mg by mouth every 6 (six) hours as needed for pain.    Yes Historical Provider, MD  aspirin EC 81 MG tablet Take 81 mg by mouth daily.   Yes Historical Provider, MD  azithromycin (ZITHROMAX) 250 MG tablet Take 1 tablet (250 mg total) by mouth daily. X 7 days 03/14/14  Yes Ripudeep Krystal Eaton, MD  calcitRIOL (ROCALTROL) 0.25 MCG capsule Take 0.25 mcg by mouth daily.  04/06/12  Yes Historical Provider, MD  carvedilol (COREG) 25 MG tablet TAKE 1 TABLET (25 MG TOTAL) BY MOUTH 2 (TWO) TIMES DAILY WITH A MEAL. 03/13/14  Yes [name], MD  cefUROXime (CEFTIN) 500 MG tablet Take 1 tablet (500 mg total) by mouth daily. X 7days 03/14/14  Yes Ripudeep Krystal Eaton, MD  docusate sodium (COLACE) 100 MG capsule Take 100 mg by mouth daily as needed for constipation.    Yes Historical Provider, MD  furosemide (LASIX) 80 MG tablet TAKE 1 TABLET BY MOUTH DAILY, TAKE AN EXTRA 1/2 TABLET IN THE AFTERNOON ON MONDAY,WEDNESDAY,&FRIDAY 01/16/14  Yes  [name], MD  insulin glargine (LANTUS) 100 UNIT/ML injection Inject 8 Units into the skin at bedtime as needed (if cbg over 150).    Yes Historical Provider, MD  linagliptin (TRADJENTA) 5 MG TABS tablet Take 5 mg by mouth daily.    Yes Historical Provider, MD  LORazepam (ATIVAN) 0.5 MG tablet Take 0.5 mg by mouth 2 (two) times daily.   Yes Historical Provider, MD  mirtazapine (REMERON) 7.5 MG tablet Take 7.5 mg by mouth at bedtime.  09/28/12  Yes Historical Provider, MD  phenazopyridine (PYRIDIUM) 100 MG tablet Take 100 mg by mouth as needed for pain.   Yes Historical Provider, MD  potassium chloride SA (K-DUR,KLOR-CON) 20 MEQ tablet Take 2 tablets (40 mEq total) by mouth daily. 03/14/14  Yes Ripudeep Krystal Eaton, MD  promethazine (PHENERGAN) 12.5 MG tablet Take 1 tablet (12.5 mg total) by mouth every 6 (six) hours as needed for nausea or vomiting. 03/14/14  Yes Ripudeep Krystal Eaton, MD  vitamin B-12 (CYANOCOBALAMIN) 1000 MCG tablet Take 1,000 mcg by mouth daily.   Yes Historical Provider, MD  meclizine (ANTIVERT) 12.5 MG tablet Take 12.5 mg by mouth as needed for dizziness.  11/23/12   Historical Provider, MD   Physical Exam: Filed Vitals:   03/15/14 1015 03/15/14 1030 03/15/14 1045 03/15/14 1100  BP: 171/68 174/93 174/72 163/64  Pulse: 68 68 71 69  Temp:      TempSrc:      Resp: 21 22 17 21   SpO2: 99% 99% 100% 98%    Wt Readings from Last 3 Encounters:  03/14/14 79.32 kg (174 lb 13.9 oz)  10/20/13 80.287 kg (177 lb)  07/14/13 81.194 kg (179 lb)    General:  Patient appears mildly dyspneic, otherwise awake, alert, oriented 3 Eyes: PERRL, normal lids, irises & conjunctiva ENT: grossly normal hearing, lips & tongue Neck: no LAD, masses or thyromegaly Cardiovascular: RRR, she has 3/6 systolic ejection murmur. No LE edema. Telemetry: SR, no arrhythmias  Respiratory: Bibasilar crackles are present, mildly dyspneic, not using accessory muscles. Abdomen: soft, ntnd Skin: no rash or induration  seen on limited exam Musculoskeletal: grossly normal tone BUE/BLE Psychiatric: grossly normal mood and affect, speech fluent and appropriate Neurologic: grossly non-focal.          Labs on Admission:  Basic Metabolic Panel:  Recent Labs Lab 03/12/14 1615 03/13/14 0407 03/14/14 0425 03/15/14 0914  NA 143 139 138 139  K 4.1 3.0* 3.0* 4.1  CL 102 102 103 104  CO2 28 21 29 23   GLUCOSE 115* 88 82 146*  BUN 36* 33* 32* 35*  CREATININE 2.50* 2.30* 2.58* 2.76*  CALCIUM 9.2 8.7 8.4 8.9   Liver Function Tests:  Recent Labs Lab 03/13/14 0407 03/15/14 0914  AST 11 15  ALT <5 7  ALKPHOS 161* 160*  BILITOT 0.4 0.3  PROT 5.6* 6.2  ALBUMIN 2.5* 3.1*   No results for input(s): LIPASE, AMYLASE in the last 168 hours. No results for input(s): AMMONIA in the last 168 hours. CBC:  Recent Labs Lab 03/12/14 1615 03/13/14 0407 03/14/14 0425 03/15/14 0914  WBC 10.1 9.1 7.0 8.9  NEUTROABS 7.7 6.8  --  7.3  HGB 10.0* 9.4* 9.5* 11.0*  HCT 32.4* 30.6* 30.0* 35.3*  MCV 92.3 90.0 90.6 92.2  PLT 140* 145* 152 184   Cardiac Enzymes: No results for input(s): CKTOTAL, CKMB, CKMBINDEX, TROPONINI in the last 168 hours.  BNP (last 3 results) No results for input(s): PROBNP in  the last 8760 hours. CBG:  Recent Labs Lab 03/13/14 1201 03/13/14 1623 03/13/14 2047 03/14/14 0612 03/14/14 1113  GLUCAP 139* 108* 118* 90 122*    Radiological Exams on Admission: Dg Chest 2 View  03/15/2014   CLINICAL DATA:  Left-sided chest pain with cough and vomiting since yesterday; recent hospitalization for right-sided pneumonia  EXAM: CHEST  2 VIEW  COMPARISON:  Chest x-ray of March 12, 2014  FINDINGS: There is persistent abnormally increased density in the right lower lobe with fluid in the minor fissure. On the left there is new increased density inferiolaterally with blunting of the costophrenic angles. The cardiopericardial silhouette is partially obscured but remains enlarged. The central  pulmonary vascularity is engorged.  IMPRESSION: Interval development of atelectasis or pneumonia in the left lower lobe. There is persistent infiltrate in the right lower lobe. There is superimposed mild CHF.   Electronically Signed   By: David  Martinique   On: 03/15/2014 09:40    EKG: Independently reviewed. EKG showing atrial fibrillation  Assessment/Plan Principal Problem:   HCAP (healthcare-associated pneumonia) Active Problems:   Respiratory failure   Diabetes mellitus, type II   Hypertension   CKD (chronic kidney disease) stage 4, GFR 15-29 ml/min   Chronic diastolic CHF (congestive heart failure), NYHA class 2   1. Acute respiratory failure. Evidence by patient presenting in respiratory distress, emergency room staff reporting O2 sats in the mid-80s having a new oxygen requirement at 3 L nasal cannula, likely secondary to combination of both healthcare associated pneumonia and acute decompensated CHF. Chest x-ray showing new left sided infiltrate along with evidence of pulmonary edema. Will initiate broad-spectrum empiric IV antimicrobial therapy along with IV Lasix 60 mg twice a day, monitor ins and outs, daily weights. 2. Healthcare associated pneumonia. Patient having a recent hospitalization, discharged on 03/14/2014 at which time she was treated for community-acquired pneumonia. Chest x-ray performed today showing new left-sided infiltrate as well as persistent right-sided opacities. Suspect healthcare associated pneumonia, will start broad-spectrum empiric IV antimicrobial therapy with IV cefepime and vancomycin. Obtain blood cultures and sputum cultures, repeat chest x-ray in a.m. 3. Suspected acute on chronic diastolic congestive heart failure. Last transthoracic echocardiogram performed on 10/26/2013 that showed EF of 60-65%. As mentioned above I suspect there may be in acute CHF component to respiratory failure. Will check a BNP, meanwhile started on Lasix 60 mg IV twice a day. She  received 40 mg of IV Lasix in the emergency room. We'll keep track of daily weights and ins and outs. 4. Stage IV chronic kidney disease, having baseline creatinine near 2.5. Initial lab work showing creatinine of 2.76. Administering IV Lasix for suspected acute CHF, will monitor kidney function closely. 5. Type 2 diabetes mellitus. We'll continue Lantus 8 units subcutaneous at bedtime, Accu-Cheks before every meal and daily at bedtime, with sliding scale coverage 6. Hypertension. Continue Coreg 12.5 mg by mouth twice a day, starting IV Lasix 60 mg twice a day 7. Atrial fibrillation. Currently rate controlled, continue Coreg 8. DVT prophylaxis. Subcutaneous heparin    Code Status: Spoke with patient and family members, she is a full code Family Communication: Spoke with her daughters who were present at bedside Disposition Plan: Will admit patient to the inpatient service, anticipate she'll require greater than 2 nights hospitalization  Time spent: 70 minutes  Kelvin Cellar [facility] Pager [phone]

## 2014-03-15 NOTE — ED Notes (Signed)
Attempted report 

## 2014-03-15 NOTE — Progress Notes (Signed)
Pt admitted to [place] from emergency room. Oriented to room family at the bedside. Verbalized understanding.

## 2014-03-15 NOTE — ED Provider Notes (Signed)
TIME SEEN: 9:10 AM  CHIEF COMPLAINT: Coughing, shortness of breath, posttussive emesis, near-syncope  HPI: Patient is a 80 y.o. F with history of diabetes, atrial fibrillation not on anticoagulation, CHF, hypertension, chronic kidney disease who was recently admitted to the hospital for community-acquired pneumonia. Was discharged home on azithromycin and Ceftin. States she has become more short of breath, had multiple episodes of posttussive emesis and near-syncope with coughing episodes. In the emergency department her oxygen saturation is 82% on room air. She does not wear oxygen at home. Denies that she's had any fever. Does have some mild left-sided chest pain that is reproducible with palpation.  ROS: See HPI Constitutional: no fever  Eyes: no drainage  ENT: no runny nose   Cardiovascular:   chest pain  Resp:  SOB  GI:  vomiting GU: no dysuria Integumentary: no rash  Allergy: no hives  Musculoskeletal: no leg swelling  Neurological: no slurred speech ROS otherwise negative  PAST MEDICAL HISTORY/PAST SURGICAL HISTORY:  Past Medical History  Diagnosis Date  . Renal insufficiency, mild   . Anxiety   . Diabetes mellitus     diabetic retinopathy  . Anemia   . Aortic stenosis, mild   . Urinary tract infection   . Fall at home   . Colostomy care     colon ca  . Colon cancer 30 year ago  . Chronic atrial fibrillation     not on anticoagulation due to increased risk of falls  . Diastolic dysfunction   . Chronic diastolic CHF (congestive heart failure)   . Hypertension     tracy turner    MEDICATIONS:  Prior to Admission medications   Medication Sig Start Date End Date Taking? Authorizing Provider  acetaminophen (TYLENOL) 500 MG tablet Take 500 mg by mouth every 6 (six) hours as needed for pain.     Historical Provider, MD  aspirin EC 81 MG tablet Take 81 mg by mouth daily.    Historical Provider, MD  azithromycin (ZITHROMAX) 250 MG tablet Take 1 tablet (250 mg total) by  mouth daily. X 7 days 03/14/14   Ripudeep Krystal Eaton, MD  calcitRIOL (ROCALTROL) 0.25 MCG capsule Take 0.25 mcg by mouth daily.  04/06/12   Historical Provider, MD  carvedilol (COREG) 25 MG tablet TAKE 1 TABLET (25 MG TOTAL) BY MOUTH 2 (TWO) TIMES DAILY WITH A MEAL. 03/13/14   [name], MD  cefUROXime (CEFTIN) 500 MG tablet Take 1 tablet (500 mg total) by mouth daily. X 7days 03/14/14   Ripudeep Krystal Eaton, MD  docusate sodium (COLACE) 100 MG capsule Take 100 mg by mouth daily as needed for constipation.     Historical Provider, MD  furosemide (LASIX) 80 MG tablet TAKE 1 TABLET BY MOUTH DAILY, TAKE AN EXTRA 1/2 TABLET IN THE AFTERNOON ON MONDAY,WEDNESDAY,&FRIDAY 01/16/14   [name], MD  insulin glargine (LANTUS) 100 UNIT/ML injection Inject 8 Units into the skin at bedtime as needed (if cbg over 150).     Historical Provider, MD  linagliptin (TRADJENTA) 5 MG TABS tablet Take 5 mg by mouth daily.     Historical Provider, MD  LORazepam (ATIVAN) 0.5 MG tablet Take 0.5 mg by mouth 2 (two) times daily.    Historical Provider, MD  meclizine (ANTIVERT) 12.5 MG tablet Take 12.5 mg by mouth as needed for dizziness.  11/23/12   Historical Provider, MD  mirtazapine (REMERON) 7.5 MG tablet Take 7.5 mg by mouth at bedtime.  09/28/12   Historical Provider,  MD  phenazopyridine (PYRIDIUM) 100 MG tablet Take 100 mg by mouth as needed for pain.    Historical Provider, MD  potassium chloride SA (K-DUR,KLOR-CON) 20 MEQ tablet Take 2 tablets (40 mEq total) by mouth daily. 03/14/14   Ripudeep Krystal Eaton, MD  promethazine (PHENERGAN) 12.5 MG tablet Take 1 tablet (12.5 mg total) by mouth every 6 (six) hours as needed for nausea or vomiting. 03/14/14   Ripudeep Krystal Eaton, MD  vitamin B-12 (CYANOCOBALAMIN) 1000 MCG tablet Take 1,000 mcg by mouth daily.    Historical Provider, MD    ALLERGIES:  Allergies  Allergen Reactions  . Codeine Nausea Only  . Keflet [Cephalexin] Nausea Only  . Levaquin [Levofloxacin In D5w] Nausea Only  .  Morphine And Related Nausea Only  . Oxycodone Other (See Comments)    Abnormal behavior    SOCIAL HISTORY:  History  Substance Use Topics  . Smoking status: Never Smoker   . Smokeless tobacco: Current User    Types: Snuff  . Alcohol Use: No    FAMILY HISTORY: Family History  Problem Relation Age of Onset  . Heart disease Father   . Diabetes Sister   . Diabetes Daughter   . Hyperlipidemia Daughter   . Hypertension Daughter   . Other Daughter     varicose veins  . Cancer Son   . Diabetes Son   . Heart disease Son     before age 46  . Hyperlipidemia Son   . Hypertension Son     EXAM: BP 157/135 mmHg  Temp(Src) 98.3 F (36.8 C) (Oral)  Resp 18  SpO2 93% CONSTITUTIONAL: Alert and oriented and responds appropriately to questions. Well-appearing; well-nourished HEAD: Normocephalic EYES: Conjunctivae clear, PERRL ENT: normal nose; no rhinorrhea; moist mucous membranes; pharynx without lesions noted NECK: Supple, no meningismus, no LAD, JVD present to the middle of the neck when sitting upright  CARD: RRR; S1 and S2 appreciated; no murmurs, no clicks, no rubs, no gallops RESP: Normal chest excursion without splinting or tachypnea; breath sounds are equal bilaterally but she is diminished at her bases bilaterally worse on the right, no wheezing or rhonchi, mild crackles bilaterally ABD/GI: Normal bowel sounds; non-distended; soft, non-tender, no rebound, no guarding BACK:  The back appears normal and is non-tender to palpation, there is no CVA tenderness EXT: Normal ROM in all joints; non-tender to palpation; no edema; normal capillary refill; no cyanosis; no calf tenderness or swelling   SKIN: Normal color for age and race; warm NEURO: Moves all extremities equally PSYCH: The patient's mood and manner are appropriate. Grooming and personal hygiene are appropriate.  MEDICAL DECISION MAKING: Here with new oxygen requirement. Was recently admitted to the hospital for  community-acquired pneumonia. We'll repeat labs including BMP, troponin, chest x-ray, cultures and lactate. We'll give DuoNeb treatment.  ED PROGRESS: Labs show normal lactate, normal white count. Negative troponin. Creatinine is at its baseline. BNP is mildly elevated at 941. Repeat chest x-ray shows interval development of pneumonia in the left lower lobe persistent infiltrate in the right lower lobe with superimposed mild CHF. We'll give headache medicine and Zosyn for healthcare associated pneumonia given her recent admission. Will also give Lasix as she does have some JVD on exam. Will discuss with hospitalist for admission.     EKG Interpretation  Date/Time:  Wednesday March 15 2014 08:52:01 EST Ventricular Rate:  77 PR Interval:    QRS Duration: 90 QT Interval:  402 QTC Calculation: 455 R Axis:   122  Text Interpretation:  Atrial fibrillation Anterolateral infarct, old Confirmed by Kaylla Cobos,  DO, Jakim Drapeau ([id number]) on 03/15/2014 9:12:24 AM          [place], DO 03/15/14 1627

## 2014-03-16 ENCOUNTER — Inpatient Hospital Stay (HOSPITAL_COMMUNITY): Payer: Medicare Other

## 2014-03-16 LAB — BASIC METABOLIC PANEL
Anion gap: 7 (ref 5–15)
BUN: 32 mg/dL — ABNORMAL HIGH (ref 6–23)
CO2: 29 mmol/L (ref 19–32)
Calcium: 8.7 mg/dL (ref 8.4–10.5)
Chloride: 106 mEq/L (ref 96–112)
Creatinine, Ser: 2.63 mg/dL — ABNORMAL HIGH (ref 0.50–1.10)
GFR calc Af Amer: 18 mL/min — ABNORMAL LOW (ref >90)
GFR, EST NON AFRICAN AMERICAN: 15 mL/min — AB (ref >90)
Glucose, Bld: 134 mg/dL — ABNORMAL HIGH (ref 70–99)
Potassium: 4.3 mmol/L (ref 3.5–5.1)
SODIUM: 142 mmol/L (ref 135–145)

## 2014-03-16 LAB — CBC
HCT: 36 % (ref 36.0–46.0)
Hemoglobin: 10.6 g/dL — ABNORMAL LOW (ref 12.0–15.0)
MCH: 27 pg (ref 26.0–34.0)
MCHC: 29.4 g/dL — ABNORMAL LOW (ref 30.0–36.0)
MCV: 91.8 fL (ref 78.0–100.0)
PLATELETS: 203 10*3/uL (ref 150–400)
RBC: 3.92 MIL/uL (ref 3.87–5.11)
RDW: 14.1 % (ref 11.5–15.5)
WBC: 9.2 10*3/uL (ref 4.0–10.5)

## 2014-03-16 LAB — LEGIONELLA ANTIGEN, URINE

## 2014-03-16 LAB — GLUCOSE, CAPILLARY
GLUCOSE-CAPILLARY: 166 mg/dL — AB (ref 70–99)
GLUCOSE-CAPILLARY: 115 mg/dL — AB (ref 70–99)
Glucose-Capillary: 105 mg/dL — ABNORMAL HIGH (ref 70–99)
Glucose-Capillary: 144 mg/dL — ABNORMAL HIGH (ref 70–99)
Glucose-Capillary: 137 mg/dL — ABNORMAL HIGH (ref 70–99)

## 2014-03-16 LAB — HIV ANTIBODY (ROUTINE TESTING W REFLEX): HIV 1&2 Ab, 4th Generation: NONREACTIVE

## 2014-03-16 LAB — HEMOGLOBIN A1C
Hgb A1c MFr Bld: 6.5 % — ABNORMAL HIGH (ref <5.7)
Mean Plasma Glucose: 140 mg/dL — ABNORMAL HIGH (ref <117)

## 2014-03-16 MED ORDER — DOXYCYCLINE HYCLATE 100 MG PO TABS
100.0000 mg | ORAL_TABLET | Freq: Two times a day (BID) | ORAL | Status: DC
  Administered 2014-03-16 – 2014-03-18 (×5): 100 mg via ORAL
  Filled 2014-03-16 (×6): qty 1

## 2014-03-16 NOTE — Progress Notes (Signed)
Patient Demographics  [name], is a 80 y.o. female, DOB - 1925/08/04, GMW:[id number]  Admit date - 03/15/2014   Admitting Physician Kelvin Cellar, MD  Outpatient Primary MD for the patient is FRIED, [name], MD  LOS - 1   Chief Complaint  Patient presents with  . Shortness of Breath        Subjective:   [name] today has, No headache, No chest pain, No abdominal pain - No Nausea, No new weakness tingling or numbness, No Cough - SOB.    Assessment & Plan   1. Acute on chronic hypoxic respiratory failure secondary to acute on chronic diastolic CHF EF 44% with questionable HCAP. Patient has no fever or leukocytosis, doubt this is pneumonia, we'll taper down antibiotics to doxycycline, no history of aspiration. Continue IV Lasix, continue salt and fluid restriction, already on beta blocker. Will monitor closely. She is feeling a whole lot better.    2. Stage IV chronic kidney disease. Baseline creatinine of around 2.5 - 2.6. Currently close to baseline we'll monitor closely with ongoing diuresis.    3. Type 2 diabetes mellitus. Continue present regimen of Lantus, sliding scale and Tradjenta   No results found for: HGBA1C  CBG (last 3)   Recent Labs  03/15/14 1618 03/15/14 2146 03/16/14 0607  GLUCAP 126* 86 105*      4. History of atrial fibrillation. Currently on Coreg, goal rate control, not on anticoagulation per primary cardiology due to advanced age and fall risk.     5. Essential hypertension. On Coreg and Lasix stable.     Code Status: Full  Family Communication: None  Disposition Plan: Home   Procedures CXR   Consults None   Medications  Scheduled Meds: . aspirin EC  81 mg Oral Daily  . calcitRIOL  0.25 mcg Oral Daily  . carvedilol  12.5 mg  Oral BID WC  . doxycycline  100 mg Oral Q12H  . furosemide  60 mg Intravenous BID  . heparin  5,000 Units Subcutaneous 3 times per day  . insulin aspart  0-15 Units Subcutaneous TID WC  . insulin aspart  0-5 Units Subcutaneous QHS  . linagliptin  5 mg Oral Daily  . mirtazapine  7.5 mg Oral QHS  . potassium chloride SA  40 mEq Oral Daily  . sodium chloride  3 mL Intravenous Q12H   Continuous Infusions:  PRN Meds:.sodium chloride, docusate sodium, insulin glargine, LORazepam, ondansetron (ZOFRAN) IV, sodium chloride  DVT Prophylaxis   Heparin   Lab Results  Component Value Date   PLT 203 03/16/2014    Antibiotics     Anti-infectives    Start     Dose/Rate Route Frequency Ordered Stop   03/17/14 1200  vancomycin (VANCOCIN) IVPB 1000 mg/200 mL premix  Status:  Discontinued     1,000 mg200 mL/hr over 60 Minutes Intravenous Every 48 hours 03/15/14 1041 03/15/14 1142   03/17/14 1200  vancomycin (VANCOCIN) IVPB 1000 mg/200 mL premix  Status:  Discontinued     1,000 mg200 mL/hr over 60 Minutes Intravenous Every 48 hours 03/15/14 1144 03/16/14 1046   03/16/14 1100  doxycycline (VIBRA-TABS) tablet 100 mg     100 mg Oral Every 12 hours 03/16/14 1046  03/15/14 1800  piperacillin-tazobactam (ZOSYN) IVPB 2.25 g  Status:  Discontinued     2.25 g100 mL/hr over 30 Minutes Intravenous 4 times per day 03/15/14 1041 03/15/14 1142   03/15/14 1300  ceFEPIme (MAXIPIME) 1 g in dextrose 5 % 50 mL IVPB  Status:  Discontinued     1 g100 mL/hr over 30 Minutes Intravenous Every 24 hours 03/15/14 1142 03/16/14 1046   03/15/14 1045  piperacillin-tazobactam (ZOSYN) IVPB 3.375 g     3.375 g100 mL/hr over 30 Minutes Intravenous  Once 03/15/14 1038 03/15/14 1119   03/15/14 1045  vancomycin (VANCOCIN) IVPB 1000 mg/200 mL premix     1,000 mg200 mL/hr over 60 Minutes Intravenous  Once 03/15/14 1038 03/15/14 1148          Objective:   Filed Vitals:   03/15/14 1444 03/15/14 1959 03/16/14 0511 03/16/14 1033   BP: 152/61 169/69 140/61 129/45  Pulse: 58 72 70 58  Temp: 97.4 F (36.3 C) 98.3 F (36.8 C) 97.7 F (36.5 C)   TempSrc: Oral Oral Oral   Resp: 18 20 20    Height:      Weight:   78.2 kg (172 lb 6.4 oz)   SpO2: 98% 99% 97%     Wt Readings from Last 3 Encounters:  03/16/14 78.2 kg (172 lb 6.4 oz)  03/14/14 79.32 kg (174 lb 13.9 oz)  10/20/13 80.287 kg (177 lb)     Intake/Output Summary (Last 24 hours) at 03/16/14 1047 Last data filed at 03/16/14 1040  Gross per 24 hour  Intake   1323 ml  Output   2200 ml  Net   -877 ml     Physical Exam  Awake Alert, Oriented X 3, No new F.N deficits, Normal affect [place].AT,PERRAL Supple Neck,No JVD, No cervical lymphadenopathy appriciated.  Symmetrical Chest wall movement, Good air movement bilaterally, CTAB RRR,No Gallops,Rubs or new Murmurs, No Parasternal Heave +ve B.Sounds, Abd Soft, No tenderness, No organomegaly appriciated, No rebound - guarding or rigidity. No Cyanosis, Clubbing , trace edema, No new Rash or bruise    Data Review   Micro Results Recent Results (from the past 240 hour(s))  Culture, blood (routine x 2) Call MD if unable to obtain prior to antibiotics being given     Status: None (Preliminary result)   Collection Time: 03/13/14 12:35 AM  Result Value Ref Range Status   Specimen Description BLOOD LEFT ARM  Final   Special Requests BOTTLES DRAWN AEROBIC AND ANAEROBIC 5CC EACH  Final   Culture  Setup Time   Final    03/13/2014 10:18 Performed at [facility]    Culture   Final           BLOOD CULTURE RECEIVED NO GROWTH TO DATE CULTURE WILL BE HELD FOR 5 DAYS BEFORE ISSUING A FINAL NEGATIVE REPORT Performed at [facility]    Report Status PENDING  Incomplete  Culture, blood (routine x 2) Call MD if unable to obtain prior to antibiotics being given     Status: None (Preliminary result)   Collection Time: 03/13/14 12:38 AM  Result Value Ref Range Status   Specimen Description BLOOD RIGHT ARM   Final   Special Requests BOTTLES DRAWN AEROBIC ONLY 5CC  Final   Culture  Setup Time   Final    03/13/2014 10:18 Performed at [facility]    Culture   Final           BLOOD CULTURE RECEIVED NO GROWTH TO  DATE CULTURE WILL BE HELD FOR 5 DAYS BEFORE ISSUING A FINAL NEGATIVE REPORT Performed at [facility]    Report Status PENDING  Incomplete  Culture, sputum-assessment     Status: None   Collection Time: 03/13/14  7:25 AM  Result Value Ref Range Status   Specimen Description SPUTUM  Final   Special Requests NONE  Final   Sputum evaluation   Final    MICROSCOPIC FINDINGS SUGGEST THAT THIS SPECIMEN IS NOT REPRESENTATIVE OF LOWER RESPIRATORY SECRETIONS. PLEASE RECOLLECT. Gram Stain Report Called to,Read Back By and Verified With: NELLIE RN AT [phone] 03/13/14 BY K BARR    Report Status 03/13/2014 FINAL  Final  Blood culture (routine x 2)     Status: None (Preliminary result)   Collection Time: 03/15/14  9:08 AM  Result Value Ref Range Status   Specimen Description BLOOD RIGHT ANTECUBITAL  Final   Special Requests BOTTLES DRAWN AEROBIC AND ANAEROBIC 5CC  Final   Culture  Setup Time   Final    03/15/2014 14:48 Performed at [facility]    Culture   Final           BLOOD CULTURE RECEIVED NO GROWTH TO DATE CULTURE WILL BE HELD FOR 5 DAYS BEFORE ISSUING A FINAL NEGATIVE REPORT Performed at [facility]    Report Status PENDING  Incomplete  Blood culture (routine x 2)     Status: None (Preliminary result)   Collection Time: 03/15/14  9:14 AM  Result Value Ref Range Status   Specimen Description BLOOD RIGHT FOREARM  Final   Special Requests BOTTLES DRAWN AEROBIC AND ANAEROBIC 5CC  Final   Culture  Setup Time   Final    03/15/2014 14:47 Performed at [facility]    Culture   Final           BLOOD CULTURE RECEIVED NO GROWTH TO DATE CULTURE WILL BE HELD FOR 5 DAYS BEFORE ISSUING A FINAL NEGATIVE REPORT Performed at [facility]    Report  Status PENDING  Incomplete    Radiology Reports Dg Chest 2 View  03/16/2014   CLINICAL DATA:  Shortness of breath.  EXAM: CHEST  2 VIEW  COMPARISON:  03/15/2014.  FINDINGS: Mediastinal structures are normal. Stable mild cardiomegaly. Bibasilar pulmonary interstitial prominence and bilateral pleural effusions are noted again on today's study. These findings are consistent with mild congestive heart failure. Right base atelectasis. No pneumothorax. No acute bony abnormality.  IMPRESSION: 1. Persistent changes of congestive heart failure and pulmonary interstitial edema. Small pleural effusions. 2. Right base subsegmental atelectasis.   Electronically Signed   By: Marcello Moores  Register   On: 03/16/2014 09:06   Dg Chest 2 View  03/15/2014   CLINICAL DATA:  Left-sided chest pain with cough and vomiting since yesterday; recent hospitalization for right-sided pneumonia  EXAM: CHEST  2 VIEW  COMPARISON:  Chest x-ray of March 12, 2014  FINDINGS: There is persistent abnormally increased density in the right lower lobe with fluid in the minor fissure. On the left there is new increased density inferiolaterally with blunting of the costophrenic angles. The cardiopericardial silhouette is partially obscured but remains enlarged. The central pulmonary vascularity is engorged.  IMPRESSION: Interval development of atelectasis or pneumonia in the left lower lobe. There is persistent infiltrate in the right lower lobe. There is superimposed mild CHF.   Electronically Signed   By: David  [place]   On: 03/15/2014 09:40   Dg Chest 2 View  03/12/2014  CLINICAL DATA:  Productive cough for 1 day. Cough for 1 week. History of colon carcinoma  EXAM: CHEST  2 VIEW  COMPARISON:  November 03, 2012  FINDINGS: There is consolidation in the right middle lobe. There is also some consolidation in portions of the right lower lobe. There is a small right effusion. There is underlying emphysema. The left lung is clear. Heart is upper normal  in size with pulmonary vascularity within normal limits. No adenopathy. There is calcification in the right carotid artery.  IMPRESSION: Areas of consolidation in the right middle lobe and to a lesser extent right lower lobe. Left lung clear. Underlying emphysema. Right carotid artery calcification.   Electronically Signed   By: Lowella Grip M.D.   On: 03/12/2014 15:19     CBC  Recent Labs Lab 03/12/14 1615 03/13/14 0407 03/14/14 0425 03/15/14 0914 03/16/14 0405  WBC 10.1 9.1 7.0 8.9 9.2  HGB 10.0* 9.4* 9.5* 11.0* 10.6*  HCT 32.4* 30.6* 30.0* 35.3* 36.0  PLT 140* 145* 152 184 203  MCV 92.3 90.0 90.6 92.2 91.8  MCH 28.5 27.6 28.7 28.7 27.0  MCHC 30.9 30.7 31.7 31.2 29.4*  RDW 13.8 14.0 13.9 14.0 14.1  LYMPHSABS 1.4 1.5  --  1.1  --   MONOABS 1.0 0.7  --  0.4  --   EOSABS 0.0 0.0  --  0.0  --   BASOSABS 0.0 0.0  --  0.0  --     Chemistries   Recent Labs Lab 03/12/14 1615 03/13/14 0407 03/14/14 0425 03/15/14 0914 03/16/14 0405  NA 143 139 138 139 142  K 4.1 3.0* 3.0* 4.1 4.3  CL 102 102 103 104 106  CO2 28 21 29 23 29   GLUCOSE 115* 88 82 146* 134*  BUN 36* 33* 32* 35* 32*  CREATININE 2.50* 2.30* 2.58* 2.76* 2.63*  CALCIUM 9.2 8.7 8.4 8.9 8.7  AST  --  11  --  15  --   ALT  --  <5  --  7  --   ALKPHOS  --  161*  --  160*  --   BILITOT  --  0.4  --  0.3  --    ------------------------------------------------------------------------------------------------------------------ estimated creatinine clearance is 15.2 mL/min (by C-G formula based on Cr of 2.63). ------------------------------------------------------------------------------------------------------------------ No results for input(s): HGBA1C in the last 72 hours. ------------------------------------------------------------------------------------------------------------------ No results for input(s): CHOL, HDL, LDLCALC, TRIG, CHOLHDL, LDLDIRECT in the last 72  hours. ------------------------------------------------------------------------------------------------------------------ No results for input(s): TSH, T4TOTAL, T3FREE, THYROIDAB in the last 72 hours.  Invalid input(s): FREET3 ------------------------------------------------------------------------------------------------------------------ No results for input(s): VITAMINB12, FOLATE, FERRITIN, TIBC, IRON, RETICCTPCT in the last 72 hours.  Coagulation profile  Recent Labs Lab 03/15/14 0914  INR 1.20    No results for input(s): DDIMER in the last 72 hours.  Cardiac Enzymes No results for input(s): CKMB, TROPONINI, MYOGLOBIN in the last 168 hours.  Invalid input(s): CK ------------------------------------------------------------------------------------------------------------------ Invalid input(s): POCBNP     Time Spent in minutes  35   Deanta Mincey K M.D on 03/16/2014 at 10:47 AM  Between 7am to 7pm - Pager - [phone]  After 7pm go to [email or web address] - [facility] Office  [phone]

## 2014-03-16 NOTE — Evaluation (Signed)
Physical Therapy Evaluation Patient Details Name: [name] MRN: [id number] DOB: Feb 26, 1926 Today's Date: 03/16/2014   History of Present Illness  Pt just dc'd home after hospitalization for PNA. Pt readmitted with incr SOB and found to have acute CHF in addition to PNA. PMH - DM, retinopathy, colon Ca with colostomy, afib, CHF   Clinical Impression  Pt admitted with above diagnosis. Pt currently with functional limitations due to the deficits listed below (see PT Problem List).  Pt will benefit from skilled PT to increase their independence and safety with mobility to allow discharge to the venue listed below.  Expect pt will be able to return home with intermittent support of family.     Follow Up Recommendations Home health PT;Supervision - Intermittent    Equipment Recommendations  None recommended by PT    Recommendations for Other Services       Precautions / Restrictions Precautions Precautions: Fall      Mobility  Bed Mobility Overal bed mobility: Modified Independent                Transfers Overall transfer level: Needs assistance Equipment used: Rolling walker (2 wheeled) Transfers: Sit to/from Stand Sit to Stand: Supervision            Ambulation/Gait Ambulation/Gait assistance: Min guard Ambulation Distance (Feet): 175 Feet Assistive device: Rolling walker (2 wheeled) Gait Pattern/deviations: Step-through pattern;Decreased step length - right;Decreased step length - left Gait velocity: decreased Gait velocity interpretation: Below normal speed for age/gender General Gait Details: Verbal cues to stay closer to walker. Pt uses rollator at home.  Stairs            Wheelchair Mobility    Modified Rankin (Stroke Patients Only)       Balance Overall balance assessment: Needs assistance Sitting-balance support: No upper extremity supported;Feet supported Sitting balance-Leahy Scale: Normal     Standing balance support: No upper  extremity supported Standing balance-Leahy Scale: Fair                               Pertinent Vitals/Pain Pain Assessment: No/denies pain    Home Living Family/patient expects to be discharged to:: Private residence Living Arrangements: Alone Available Help at Discharge: Family;Available 24 hours/day Type of Home: House Home Access: Ramped entrance     Home Layout: One level Home Equipment: Walker - 4 wheels      Prior Function Level of Independence: Independent with assistive device(s)         Comments: Pt has refused to stay with daughter in the past.     Hand Dominance   Dominant Hand: Right    Extremity/Trunk Assessment   Upper Extremity Assessment: Overall WFL for tasks assessed           Lower Extremity Assessment: Overall WFL for tasks assessed         Communication   Communication: No difficulties  Cognition Arousal/Alertness: Awake/alert Behavior During Therapy: WFL for tasks assessed/performed Overall Cognitive Status: Within Functional Limits for tasks assessed                      General Comments      Exercises        Assessment/Plan    PT Assessment Patient needs continued PT services  PT Diagnosis Difficulty walking   PT Problem List Decreased activity tolerance;Decreased mobility;Decreased balance  PT Treatment Interventions DME instruction;Gait training;Stair training;Functional mobility training;Therapeutic activities;Patient/family  education   PT Goals (Current goals can be found in the Care Plan section) Acute Rehab PT Goals Patient Stated Goal: go home PT Goal Formulation: With patient Time For Goal Achievement: 03/23/14 Potential to Achieve Goals: Good    Frequency Min 3X/week   Barriers to discharge        Co-evaluation               End of Session Equipment Utilized During Treatment: Gait belt;Oxygen Activity Tolerance: Patient tolerated treatment well Patient left: in chair;with  call bell/phone within reach Nurse Communication: Mobility status         Time: 2800-3491 PT Time Calculation (min) (ACUTE ONLY): 14 min   Charges:   PT Evaluation $Initial PT Evaluation Tier I: 1 Procedure PT Treatments $Gait Training: 8-22 mins   PT G Codes:        Ezrie Bunyan 2014/03/23, 3:41 PM  [facility] PT [phone]

## 2014-03-17 ENCOUNTER — Inpatient Hospital Stay (HOSPITAL_COMMUNITY): Payer: Medicare Other

## 2014-03-17 LAB — GLUCOSE, CAPILLARY
GLUCOSE-CAPILLARY: 96 mg/dL (ref 70–99)
GLUCOSE-CAPILLARY: 148 mg/dL — AB (ref 70–99)
Glucose-Capillary: 159 mg/dL — ABNORMAL HIGH (ref 70–99)
Glucose-Capillary: 97 mg/dL (ref 70–99)

## 2014-03-17 NOTE — Progress Notes (Signed)
Patient Demographics  [name], is a 80 y.o. female, DOB - January 21, 1926, TAV:[id number]  Admit date - 03/15/2014   Admitting Physician Kelvin Cellar, MD  Outpatient Primary MD for the patient is FRIED, [name], MD  LOS - 2   Chief Complaint  Patient presents with  . Shortness of Breath        Subjective:   [name] today has, No headache, No chest pain, No abdominal pain - No Nausea, No new weakness tingling or numbness, No Cough - SOB.    Assessment & Plan   1. Acute on chronic hypoxic respiratory failure secondary to acute on chronic diastolic CHF EF 55% with questionable HCAP. Patient has no fever or leukocytosis, doubt this is pneumonia, we'll taper down antibiotics to doxycycline, no history of aspiration. Continue IV Lasix, continue salt and fluid restriction, already on beta blocker. Will monitor closely. Titrate off O2, PT.    2. Stage IV chronic kidney disease. Baseline creatinine of around 2.5 - 2.6. Currently close to baseline we'll monitor closely with ongoing diuresis.    3. Type 2 diabetes mellitus. Continue present regimen of Lantus, sliding scale and Tradjenta   Lab Results  Component Value Date   HGBA1C 6.5* 03/16/2014    CBG (last 3)   Recent Labs  03/16/14 1730 03/16/14 2100 03/17/14 0602  GLUCAP 115* 137* 96      4. History of atrial fibrillation. Currently on Coreg, goal rate control, not on anticoagulation per primary cardiology due to advanced age and fall risk.     5. Essential hypertension. On Coreg and Lasix stable.     Code Status: Full  Family Communication: None  Disposition Plan: Home   Procedures CXR   Consults None   Medications  Scheduled Meds: . aspirin EC  81 mg Oral Daily  . calcitRIOL  0.25 mcg Oral Daily  .  carvedilol  12.5 mg Oral BID WC  . doxycycline  100 mg Oral Q12H  . furosemide  60 mg Intravenous BID  . heparin  5,000 Units Subcutaneous 3 times per day  . insulin aspart  0-15 Units Subcutaneous TID WC  . insulin aspart  0-5 Units Subcutaneous QHS  . linagliptin  5 mg Oral Daily  . mirtazapine  7.5 mg Oral QHS  . potassium chloride SA  40 mEq Oral Daily  . sodium chloride  3 mL Intravenous Q12H   Continuous Infusions:  PRN Meds:.sodium chloride, docusate sodium, insulin glargine, LORazepam, ondansetron (ZOFRAN) IV, sodium chloride  DVT Prophylaxis   Heparin   Lab Results  Component Value Date   PLT 203 03/16/2014    Antibiotics     Anti-infectives    Start     Dose/Rate Route Frequency Ordered Stop   03/17/14 1200  vancomycin (VANCOCIN) IVPB 1000 mg/200 mL premix  Status:  Discontinued     1,000 mg200 mL/hr over 60 Minutes Intravenous Every 48 hours 03/15/14 1041 03/15/14 1142   03/17/14 1200  vancomycin (VANCOCIN) IVPB 1000 mg/200 mL premix  Status:  Discontinued     1,000 mg200 mL/hr over 60 Minutes Intravenous Every 48 hours 03/15/14 1144 03/16/14 1046   03/16/14 1200  doxycycline (VIBRA-TABS) tablet 100 mg     100 mg Oral Every  12 hours 03/16/14 1046     03/15/14 1800  piperacillin-tazobactam (ZOSYN) IVPB 2.25 g  Status:  Discontinued     2.25 g100 mL/hr over 30 Minutes Intravenous 4 times per day 03/15/14 1041 03/15/14 1142   03/15/14 1300  ceFEPIme (MAXIPIME) 1 g in dextrose 5 % 50 mL IVPB  Status:  Discontinued     1 g100 mL/hr over 30 Minutes Intravenous Every 24 hours 03/15/14 1142 03/16/14 1046   03/15/14 1045  piperacillin-tazobactam (ZOSYN) IVPB 3.375 g     3.375 g100 mL/hr over 30 Minutes Intravenous  Once 03/15/14 1038 03/15/14 1119   03/15/14 1045  vancomycin (VANCOCIN) IVPB 1000 mg/200 mL premix     1,000 mg200 mL/hr over 60 Minutes Intravenous  Once 03/15/14 1038 03/15/14 1148          Objective:   Filed Vitals:   03/16/14 1823 03/16/14 2051  03/17/14 0239 03/17/14 0548  BP: 158/55 131/68 128/56 148/46  Pulse: 66 64 64 66  Temp:  98.1 F (36.7 C) 97.6 F (36.4 C) 98.1 F (36.7 C)  TempSrc:  Oral Oral Oral  Resp:  18 17 18   Height:      Weight:    78.1 kg (172 lb 2.9 oz)  SpO2:  97% 97% 96%    Wt Readings from Last 3 Encounters:  03/17/14 78.1 kg (172 lb 2.9 oz)  03/14/14 79.32 kg (174 lb 13.9 oz)  10/20/13 80.287 kg (177 lb)     Intake/Output Summary (Last 24 hours) at 03/17/14 0928 Last data filed at 03/17/14 0815  Gross per 24 hour  Intake   1443 ml  Output    800 ml  Net    643 ml     Physical Exam  Awake Alert, Oriented X 3, No new F.N deficits, Normal affect F[place].AT,PERRAL Supple Neck,No JVD, No cervical lymphadenopathy appriciated.  Symmetrical Chest wall movement, Good air movement bilaterally, few rales RRR,No Gallops,Rubs or new Murmurs, No Parasternal Heave +ve B.Sounds, Abd Soft, No tenderness, No organomegaly appriciated, No rebound - guarding or rigidity. No Cyanosis, Clubbing , trace edema, No new Rash or bruise    Data Review   Micro Results Recent Results (from the past 240 hour(s))  Culture, blood (routine x 2) Call MD if unable to obtain prior to antibiotics being given     Status: None (Preliminary result)   Collection Time: 03/13/14 12:35 AM  Result Value Ref Range Status   Specimen Description BLOOD LEFT ARM  Final   Special Requests BOTTLES DRAWN AEROBIC AND ANAEROBIC 5CC EACH  Final   Culture  Setup Time   Final    03/13/2014 10:18 Performed at [facility]    Culture   Final           BLOOD CULTURE RECEIVED NO GROWTH TO DATE CULTURE WILL BE HELD FOR 5 DAYS BEFORE ISSUING A FINAL NEGATIVE REPORT Performed at [facility]    Report Status PENDING  Incomplete  Culture, blood (routine x 2) Call MD if unable to obtain prior to antibiotics being given     Status: None (Preliminary result)   Collection Time: 03/13/14 12:38 AM  Result Value Ref Range Status    Specimen Description BLOOD RIGHT ARM  Final   Special Requests BOTTLES DRAWN AEROBIC ONLY 5CC  Final   Culture  Setup Time   Final    03/13/2014 10:18 Performed at [place] [facility]   Final  BLOOD CULTURE RECEIVED NO GROWTH TO DATE CULTURE WILL BE HELD FOR 5 DAYS BEFORE ISSUING A FINAL NEGATIVE REPORT Performed at [facility]    Report Status PENDING  Incomplete  Culture, sputum-assessment     Status: None   Collection Time: 03/13/14  7:25 AM  Result Value Ref Range Status   Specimen Description SPUTUM  Final   Special Requests NONE  Final   Sputum evaluation   Final    MICROSCOPIC FINDINGS SUGGEST THAT THIS SPECIMEN IS NOT REPRESENTATIVE OF LOWER RESPIRATORY SECRETIONS. PLEASE RECOLLECT. Gram Stain Report Called to,Read Back By and Verified With: NELLIE RN AT [phone] 03/13/14 BY K BARR    Report Status 03/13/2014 FINAL  Final  Blood culture (routine x 2)     Status: None (Preliminary result)   Collection Time: 03/15/14  9:08 AM  Result Value Ref Range Status   Specimen Description BLOOD RIGHT ANTECUBITAL  Final   Special Requests BOTTLES DRAWN AEROBIC AND ANAEROBIC 5CC  Final   Culture  Setup Time   Final    03/15/2014 14:48 Performed at [facility]    Culture   Final           BLOOD CULTURE RECEIVED NO GROWTH TO DATE CULTURE WILL BE HELD FOR 5 DAYS BEFORE ISSUING A FINAL NEGATIVE REPORT Performed at [facility]    Report Status PENDING  Incomplete  Blood culture (routine x 2)     Status: None (Preliminary result)   Collection Time: 03/15/14  9:14 AM  Result Value Ref Range Status   Specimen Description BLOOD RIGHT FOREARM  Final   Special Requests BOTTLES DRAWN AEROBIC AND ANAEROBIC 5CC  Final   Culture  Setup Time   Final    03/15/2014 14:47 Performed at [facility]    Culture   Final           BLOOD CULTURE RECEIVED NO GROWTH TO DATE CULTURE WILL BE HELD FOR 5 DAYS BEFORE ISSUING A FINAL NEGATIVE  REPORT Performed at [facility]    Report Status PENDING  Incomplete    Radiology Reports Dg Chest 2 View  03/16/2014   CLINICAL DATA:  Shortness of breath.  EXAM: CHEST  2 VIEW  COMPARISON:  03/15/2014.  FINDINGS: Mediastinal structures are normal. Stable mild cardiomegaly. Bibasilar pulmonary interstitial prominence and bilateral pleural effusions are noted again on today's study. These findings are consistent with mild congestive heart failure. Right base atelectasis. No pneumothorax. No acute bony abnormality.  IMPRESSION: 1. Persistent changes of congestive heart failure and pulmonary interstitial edema. Small pleural effusions. 2. Right base subsegmental atelectasis.   Electronically Signed   By: Marcello Moores  Register   On: 03/16/2014 09:06   Dg Chest 2 View  03/15/2014   CLINICAL DATA:  Left-sided chest pain with cough and vomiting since yesterday; recent hospitalization for right-sided pneumonia  EXAM: CHEST  2 VIEW  COMPARISON:  Chest x-ray of March 12, 2014  FINDINGS: There is persistent abnormally increased density in the right lower lobe with fluid in the minor fissure. On the left there is new increased density inferiolaterally with blunting of the costophrenic angles. The cardiopericardial silhouette is partially obscured but remains enlarged. The central pulmonary vascularity is engorged.  IMPRESSION: Interval development of atelectasis or pneumonia in the left lower lobe. There is persistent infiltrate in the right lower lobe. There is superimposed mild CHF.   Electronically Signed   By: David  Martinique   On: 03/15/2014 09:40  Dg Chest 2 View  03/12/2014   CLINICAL DATA:  Productive cough for 1 day. Cough for 1 week. History of colon carcinoma  EXAM: CHEST  2 VIEW  COMPARISON:  November 03, 2012  FINDINGS: There is consolidation in the right middle lobe. There is also some consolidation in portions of the right lower lobe. There is a small right effusion. There is underlying  emphysema. The left lung is clear. Heart is upper normal in size with pulmonary vascularity within normal limits. No adenopathy. There is calcification in the right carotid artery.  IMPRESSION: Areas of consolidation in the right middle lobe and to a lesser extent right lower lobe. Left lung clear. Underlying emphysema. Right carotid artery calcification.   Electronically Signed   By: Lowella Grip M.D.   On: 03/12/2014 15:19     CBC  Recent Labs Lab 03/12/14 1615 03/13/14 0407 03/14/14 0425 03/15/14 0914 03/16/14 0405  WBC 10.1 9.1 7.0 8.9 9.2  HGB 10.0* 9.4* 9.5* 11.0* 10.6*  HCT 32.4* 30.6* 30.0* 35.3* 36.0  PLT 140* 145* 152 184 203  MCV 92.3 90.0 90.6 92.2 91.8  MCH 28.5 27.6 28.7 28.7 27.0  MCHC 30.9 30.7 31.7 31.2 29.4*  RDW 13.8 14.0 13.9 14.0 14.1  LYMPHSABS 1.4 1.5  --  1.1  --   MONOABS 1.0 0.7  --  0.4  --   EOSABS 0.0 0.0  --  0.0  --   BASOSABS 0.0 0.0  --  0.0  --     Chemistries   Recent Labs Lab 03/12/14 1615 03/13/14 0407 03/14/14 0425 03/15/14 0914 03/16/14 0405  NA 143 139 138 139 142  K 4.1 3.0* 3.0* 4.1 4.3  CL 102 102 103 104 106  CO2 28 21 29 23 29   GLUCOSE 115* 88 82 146* 134*  BUN 36* 33* 32* 35* 32*  CREATININE 2.50* 2.30* 2.58* 2.76* 2.63*  CALCIUM 9.2 8.7 8.4 8.9 8.7  AST  --  11  --  15  --   ALT  --  <5  --  7  --   ALKPHOS  --  161*  --  160*  --   BILITOT  --  0.4  --  0.3  --    ------------------------------------------------------------------------------------------------------------------ estimated creatinine clearance is 15.2 mL/min (by C-G formula based on Cr of 2.63). ------------------------------------------------------------------------------------------------------------------  Recent Labs  03/16/14 1130  HGBA1C 6.5*   ------------------------------------------------------------------------------------------------------------------ No results for input(s): CHOL, HDL, LDLCALC, TRIG, CHOLHDL, LDLDIRECT in the last  72 hours. ------------------------------------------------------------------------------------------------------------------ No results for input(s): TSH, T4TOTAL, T3FREE, THYROIDAB in the last 72 hours.  Invalid input(s): FREET3 ------------------------------------------------------------------------------------------------------------------ No results for input(s): VITAMINB12, FOLATE, FERRITIN, TIBC, IRON, RETICCTPCT in the last 72 hours.  Coagulation profile  Recent Labs Lab 03/15/14 0914  INR 1.20    No results for input(s): DDIMER in the last 72 hours.  Cardiac Enzymes No results for input(s): CKMB, TROPONINI, MYOGLOBIN in the last 168 hours.  Invalid input(s): CK ------------------------------------------------------------------------------------------------------------------ Invalid input(s): POCBNP     Time Spent in minutes  35   Kagen Kunath K M.D on 03/17/2014 at 9:28 AM  Between 7am to 7pm - Pager - [phone]  After 7pm go to [email or web address] - [facility] Office  [phone]

## 2014-03-18 LAB — BASIC METABOLIC PANEL
Anion gap: 9 (ref 5–15)
BUN: 30 mg/dL — AB (ref 6–23)
CALCIUM: 9.1 mg/dL (ref 8.4–10.5)
CO2: 29 mmol/L (ref 19–32)
CREATININE: 2.34 mg/dL — AB (ref 0.50–1.10)
Chloride: 103 mEq/L (ref 96–112)
GFR calc Af Amer: 21 mL/min — ABNORMAL LOW (ref >90)
GFR calc non Af Amer: 18 mL/min — ABNORMAL LOW (ref >90)
GLUCOSE: 87 mg/dL (ref 70–99)
Potassium: 4.5 mmol/L (ref 3.5–5.1)
Sodium: 141 mmol/L (ref 135–145)

## 2014-03-18 LAB — GLUCOSE, CAPILLARY
GLUCOSE-CAPILLARY: 139 mg/dL — AB (ref 70–99)
Glucose-Capillary: 95 mg/dL (ref 70–99)

## 2014-03-18 LAB — MAGNESIUM: Magnesium: 2.1 mg/dL (ref 1.5–2.5)

## 2014-03-18 MED ORDER — FUROSEMIDE 20 MG PO TABS: 60.0000 mg | ORAL_TABLET | Freq: Two times a day (BID) | ORAL | Status: DC

## 2014-03-18 MED ORDER — DOXYCYCLINE HYCLATE 100 MG PO TABS: 100.0000 mg | ORAL_TABLET | Freq: Two times a day (BID) | ORAL | Status: DC

## 2014-03-18 NOTE — Discharge Instructions (Signed)
Follow with Primary MD [name], Natasha L, MD in 7 days   Get CBC, CMP, 2 view Chest X ray checked  by Primary MD next visit.    Activity: As tolerated with Full fall precautions use walker/cane & assistance as needed   Disposition Home    Diet: Heart Healthy Low Carb.  Check your Weight same time everyday, if you gain over 2 pounds, or you develop in leg swelling, experience more shortness of breath or chest pain, call your Primary MD immediately. Follow Cardiac Low Salt Diet and 1.5 lit/day fluid restriction.   On your next visit with your primary care physician please Get Medicines reviewed and adjusted.   Please request your Prim.MD to go over all Hospital Tests and Procedure/Radiological results at the follow up, please get all Hospital records sent to your Prim MD by signing hospital release before you go home.   If you experience worsening of your admission symptoms, develop shortness of breath, life threatening emergency, suicidal or homicidal thoughts you must seek medical attention immediately by calling 911 or calling your MD immediately  if symptoms less severe.  You Must read complete instructions/literature along with all the possible adverse reactions/side effects for all the Medicines you take and that have been prescribed to you. Take any new Medicines after you have completely understood and accpet all the possible adverse reactions/side effects.   Do not drive, operating heavy machinery, perform activities at heights, swimming or participation in water activities or provide baby sitting services if your were admitted for syncope or siezures until you have seen by Primary MD or a Neurologist and advised to do so again.  Do not drive when taking Pain medications.    Do not take more than prescribed Pain, Sleep and Anxiety Medications  Special Instructions: If you have smoked or chewed Tobacco  in the last 2 yrs please stop smoking, stop any regular Alcohol  and or any  Recreational drug use.  Wear Seat belts while driving.   Please note  You were cared for by a hospitalist during your hospital stay. If you have any questions about your discharge medications or the care you received while you were in the hospital after you are discharged, you can call the unit and asked to speak with the hospitalist on call if the hospitalist that took care of you is not available. Once you are discharged, your primary care physician will handle any further medical issues. Please note that NO REFILLS for any discharge medications will be authorized once you are discharged, as it is imperative that you return to your primary care physician (or establish a relationship with a primary care physician if you do not have one) for your aftercare needs so that they can reassess your need for medications and monitor your lab values.

## 2014-03-18 NOTE — Progress Notes (Signed)
Pt on 2l [place] this am with sats 96%. sats remained at 95% on room air. Pt ambulated in hallway to nsg station with sats  Down to 93%.

## 2014-03-18 NOTE — Discharge Summary (Signed)
[name], is a 80 y.o. female  DOB Jan 27, 1926  MRN [id number].  Admission date:  03/15/2014  Admitting Physician  Kelvin Cellar, MD  Discharge Date:  03/18/2014   Primary MD  Abigail Miyamoto, MD  Recommendations for primary care physician for things to follow:   Monitor weight, BMP and diuretic dose closely. Repeat 2 view chest x-ray in a week   Admission Diagnosis  SOB (shortness of breath) [R06.02] Hypoxia [R09.02] HCAP (healthcare-associated pneumonia) [J18.9]   Discharge Diagnosis  SOB (shortness of breath) [R06.02] Hypoxia [R09.02] HCAP (healthcare-associated pneumonia) [J18.9]     Principal Problem:   HCAP (healthcare-associated pneumonia) Active Problems:   Diabetes mellitus, type II   Hypertension   CKD (chronic kidney disease) stage 4, GFR 15-29 ml/min   Chronic diastolic CHF (congestive heart failure), NYHA class 2   Respiratory failure      Past Medical History  Diagnosis Date  . Renal insufficiency, mild   . Anxiety   . Diabetes mellitus     diabetic retinopathy  . Anemia   . Aortic stenosis, mild   . Urinary tract infection   . Fall at home   . Colostomy care     colon ca  . Colon cancer 30 year ago  . Chronic atrial fibrillation     not on anticoagulation due to increased risk of falls  . Diastolic dysfunction   . Chronic diastolic CHF (congestive heart failure)   . Hypertension     tracy turner    Past Surgical History  Procedure Laterality Date  . Abdominal surgery    . Abdominal hysterectomy    . Colon surgery    . Appendectomy    . Colostomy    . Cardioversion  09/30/2011    Procedure: CARDIOVERSION;  Surgeon: [name], MD;  Location: [facility];  Service: Cardiovascular;  Laterality: N/A;  . I&d extremity  11/20/2011    Procedure: IRRIGATION AND DEBRIDEMENT EXTREMITY;   Surgeon: Alta Corning, MD;  Location: [facility];  Service: Orthopedics;  Laterality: Left;  . Av fistula placement Left 06/02/2012    Procedure: ARTERIOVENOUS (AV) FISTULA CREATION;  Surgeon: Conrad San Rafael, MD;  Location: [facility];  Service: Vascular;  Laterality: Left;  Bascilic Fistula  . Bascilic vein transposition Left 08/18/2012    Procedure: BASCILIC VEIN TRANSPOSITION;  Surgeon: Conrad , MD;  Location: [facility];  Service: Vascular;  Laterality: Left;  Left 2nd stage Basilic Vein Transposition        History of present illness and  Hospital Course:     Kindly see H&P for history of present illness and admission details, please review complete Labs, Consult reports and Test reports for all details in brief  HPI  from the history and physical done on the day of admission   [name] is a 80 y.o. female with a past medical history of chronic atrial fibrillation, chronic diastolic congestive heart failure, stage IV chronic kidney disease who was recently admitted to the medicine  service on 03/12/2014 and discharged on 03/14/2014 at which time she was treated for a community-acquired pneumonia. She was discharged on cefuroxime 500 mg by mouth daily along with a azithromycin 250 mg by mouth daily. Chest x-ray on 03/12/2014 had shown areas of consolidation in the right middle lobe and to a lesser extent right lower lobe. Patient doing poorly at home as she had progressively worsening shortness of breath associated with cough and yellow to green sputum production. She complains of generalized weakness, malaise, poor tolerance to physical exertion. She reported that symptoms significantly worsened by 3:00 this morning. Per a emergency room staff she was found to be in respiratory distress, having oxygen saturations in the mid-80s and required 3 L of oxygen via nasal cannula. A repeat chest x-ray on admission revealed interval development of opacity in the left lower lobe with persistent infiltrate in the  right lower lobe. There was also superimposed mild CHF.   Hospital Course    1. Acute on chronic hypoxic respiratory failure secondary to acute on chronic diastolic CHF EF 44% with questionable HCAP. Patient has no fever or leukocytosis, doubt this is pneumonia, we'll taper down antibiotics to doxycycline, no history of aspiration. Responded well to IV Lasix and will be transitioned to 60 mg of oral Lasix twice a day instead of 80 mg Lasix daily, continue salt and fluid restriction, already on beta blocker. She is symptom free without oxygen demand upon ambulation today. We'll request PCP to follow weight, BMP and diuretic dose closely.    2. Stage IV chronic kidney disease. Baseline creatinine of around 2.5 - 2.6. Currently close to baseline we'll monitor closely with ongoing diuresis.    3. Type 2 diabetes mellitus. A1c 6.5 home regimen continue.    4. History of atrial fibrillation. Currently on Coreg, goal rate control, not on anticoagulation per primary cardiology due to advanced age and fall risk. Outpatient cardiology follow-up post discharge.     5. Essential hypertension. On Coreg and Lasix stable.    Discharge Condition: Stable   Follow UP  Follow-up Information    Follow up with [facility].   Why:  Registered Nurse, Physical Therapy, Occupational Therapy Services to start within 24- 48 hours of hospital discharge   Contact information:   [address] [place] [address] [phone]       Follow up with FRIED, [name], MD. Schedule an appointment as soon as possible for a visit in 1 week.   Specialty:  Family Medicine   Contact information:   [address] [place] [address] [phone]       Follow up with Loralie Champagne, MD. Schedule an appointment as soon as possible for a visit in 1 week.   Specialty:  Cardiology   Contact information:   [address] [place] [address] [phone]          Discharge Instructions  and  Discharge Medications      Discharge Instructions    Discharge instructions    Complete by:  As directed   Follow with Primary MD FRIED, ROBERT L, MD in 7 days   Get CBC, CMP, 2 view Chest X ray checked  by Primary MD next visit.    Activity: As tolerated with Full fall precautions use walker/cane & assistance as needed   Disposition Home    Diet: Heart Healthy Low Carb.  Check your Weight same time everyday, if you gain over 2 pounds,  or you develop in leg swelling, experience more shortness of breath or chest pain, call your Primary MD immediately. Follow Cardiac Low Salt Diet and 1.5 lit/day fluid restriction.   On your next visit with your primary care physician please Get Medicines reviewed and adjusted.   Please request your Prim.MD to go over all Hospital Tests and Procedure/Radiological results at the follow up, please get all Hospital records sent to your Prim MD by signing hospital release before you go home.   If you experience worsening of your admission symptoms, develop shortness of breath, life threatening emergency, suicidal or homicidal thoughts you must seek medical attention immediately by calling 911 or calling your MD immediately  if symptoms less severe.  You Must read complete instructions/literature along with all the possible adverse reactions/side effects for all the Medicines you take and that have been prescribed to you. Take any new Medicines after you have completely understood and accpet all the possible adverse reactions/side effects.   Do not drive, operating heavy machinery, perform activities at heights, swimming or participation in water activities or provide baby sitting services if your were admitted for syncope or siezures until you have seen by Primary MD or a Neurologist and advised to do so again.  Do not drive when taking Pain medications.    Do not take more than prescribed Pain, Sleep and Anxiety  Medications  Special Instructions: If you have smoked or chewed Tobacco  in the last 2 yrs please stop smoking, stop any regular Alcohol  and or any Recreational drug use.  Wear Seat belts while driving.   Please note  You were cared for by a hospitalist during your hospital stay. If you have any questions about your discharge medications or the care you received while you were in the hospital after you are discharged, you can call the unit and asked to speak with the hospitalist on call if the hospitalist that took care of you is not available. Once you are discharged, your primary care physician will handle any further medical issues. Please note that NO REFILLS for any discharge medications will be authorized once you are discharged, as it is imperative that you return to your primary care physician (or establish a relationship with a primary care physician if you do not have one) for your aftercare needs so that they can reassess your need for medications and monitor your lab values.     Increase activity slowly    Complete by:  As directed             Medication List    STOP taking these medications        azithromycin 250 MG tablet  Commonly known as:  ZITHROMAX     cefUROXime 500 MG tablet  Commonly known as:  CEFTIN      TAKE these medications        acetaminophen 500 MG tablet  Commonly known as:  TYLENOL  Take 500 mg by mouth every 6 (six) hours as needed for pain.     aspirin EC 81 MG tablet  Take 81 mg by mouth daily.     calcitRIOL 0.25 MCG capsule  Commonly known as:  ROCALTROL  Take 0.25 mcg by mouth daily.     carvedilol 25 MG tablet  Commonly known as:  COREG  TAKE 1 TABLET (25 MG TOTAL) BY MOUTH 2 (TWO) TIMES DAILY WITH A MEAL.     docusate sodium 100 MG capsule  Commonly known as:  COLACE  Take 100 mg by mouth daily as needed for constipation.     doxycycline 100 MG tablet  Commonly known as:  VIBRA-TABS  Take 1 tablet (100 mg total) by mouth  every 12 (twelve) hours.     furosemide 20 MG tablet  Commonly known as:  LASIX  Take 3 tablets (60 mg total) by mouth 2 (two) times daily.     insulin glargine 100 UNIT/ML injection  Commonly known as:  LANTUS  Inject 8 Units into the skin at bedtime as needed (if cbg over 150).     linagliptin 5 MG Tabs tablet  Commonly known as:  TRADJENTA  Take 5 mg by mouth daily.     LORazepam 0.5 MG tablet  Commonly known as:  ATIVAN  Take 0.5 mg by mouth 2 (two) times daily.     meclizine 12.5 MG tablet  Commonly known as:  ANTIVERT  Take 12.5 mg by mouth as needed for dizziness.     mirtazapine 7.5 MG tablet  Commonly known as:  REMERON  Take 7.5 mg by mouth at bedtime.     phenazopyridine 100 MG tablet  Commonly known as:  PYRIDIUM  Take 100 mg by mouth as needed for pain.     potassium chloride SA 20 MEQ tablet  Commonly known as:  K-DUR,KLOR-CON  Take 2 tablets (40 mEq total) by mouth daily.     promethazine 12.5 MG tablet  Commonly known as:  PHENERGAN  Take 1 tablet (12.5 mg total) by mouth every 6 (six) hours as needed for nausea or vomiting.     vitamin B-12 1000 MCG tablet  Commonly known as:  CYANOCOBALAMIN  Take 1,000 mcg by mouth daily.          Diet and Activity recommendation: See Discharge Instructions above   Consults obtained - none   Major procedures and Radiology Reports - PLEASE review detailed and final reports for all details, in brief -       Dg Chest 2 View  03/17/2014   CLINICAL DATA:  Shortness of breath and cough. Chronic atrial fibrillation.  EXAM: CHEST  2 VIEW  COMPARISON:  03/16/2014  FINDINGS: Improved aeration at the right lung base may represent decreased atelectasis. There continues to elevation of the right hemidiaphragm and suspect residual consolidation or volume loss at the right lung base. Evidence for a small left pleural effusion. Upper lungs are clear without edema. Heart size is grossly stable.  IMPRESSION: Persistent  basilar chest densities are compatible with volume loss and pleural fluid. Slightly improved aeration at the right lung base.   Electronically Signed   By: Markus Daft M.D.   On: 03/17/2014 10:04   Dg Chest 2 View  03/16/2014   CLINICAL DATA:  Shortness of breath.  EXAM: CHEST  2 VIEW  COMPARISON:  03/15/2014.  FINDINGS: Mediastinal structures are normal. Stable mild cardiomegaly. Bibasilar pulmonary interstitial prominence and bilateral pleural effusions are noted again on today's study. These findings are consistent with mild congestive heart failure. Right base atelectasis. No pneumothorax. No acute bony abnormality.  IMPRESSION: 1. Persistent changes of congestive heart failure and pulmonary interstitial edema. Small pleural effusions. 2. Right base subsegmental atelectasis.   Electronically Signed   By: Marcello Moores  Register   On: 03/16/2014 09:06   Dg Chest 2 View  03/15/2014   CLINICAL DATA:  Left-sided chest pain with cough and vomiting since yesterday; recent hospitalization for right-sided pneumonia  EXAM: CHEST  2 VIEW  COMPARISON:  Chest x-ray of March 12, 2014  FINDINGS: There is persistent abnormally increased density in the right lower lobe with fluid in the minor fissure. On the left there is new increased density inferiolaterally with blunting of the costophrenic angles. The cardiopericardial silhouette is partially obscured but remains enlarged. The central pulmonary vascularity is engorged.  IMPRESSION: Interval development of atelectasis or pneumonia in the left lower lobe. There is persistent infiltrate in the right lower lobe. There is superimposed mild CHF.   Electronically Signed   By: David  Martinique   On: 03/15/2014 09:40   Dg Chest 2 View  03/12/2014   CLINICAL DATA:  Productive cough for 1 day. Cough for 1 week. History of colon carcinoma  EXAM: CHEST  2 VIEW  COMPARISON:  November 03, 2012  FINDINGS: There is consolidation in the right middle lobe. There is also some consolidation  in portions of the right lower lobe. There is a small right effusion. There is underlying emphysema. The left lung is clear. Heart is upper normal in size with pulmonary vascularity within normal limits. No adenopathy. There is calcification in the right carotid artery.  IMPRESSION: Areas of consolidation in the right middle lobe and to a lesser extent right lower lobe. Left lung clear. Underlying emphysema. Right carotid artery calcification.   Electronically Signed   By: Lowella Grip M.D.   On: 03/12/2014 15:19    Micro Results      Recent Results (from the past 240 hour(s))  Culture, blood (routine x 2) Call MD if unable to obtain prior to antibiotics being given     Status: None (Preliminary result)   Collection Time: 03/13/14 12:35 AM  Result Value Ref Range Status   Specimen Description BLOOD LEFT ARM  Final   Special Requests BOTTLES DRAWN AEROBIC AND ANAEROBIC 5CC EACH  Final   Culture  Setup Time   Final    03/13/2014 10:18 Performed at [facility]    Culture   Final           BLOOD CULTURE RECEIVED NO GROWTH TO DATE CULTURE WILL BE HELD FOR 5 DAYS BEFORE ISSUING A FINAL NEGATIVE REPORT Performed at [facility]    Report Status PENDING  Incomplete  Culture, blood (routine x 2) Call MD if unable to obtain prior to antibiotics being given     Status: None (Preliminary result)   Collection Time: 03/13/14 12:38 AM  Result Value Ref Range Status   Specimen Description BLOOD RIGHT ARM  Final   Special Requests BOTTLES DRAWN AEROBIC ONLY 5CC  Final   Culture  Setup Time   Final    03/13/2014 10:18 Performed at [facility]    Culture   Final           BLOOD CULTURE RECEIVED NO GROWTH TO DATE CULTURE WILL BE HELD FOR 5 DAYS BEFORE ISSUING A FINAL NEGATIVE REPORT Performed at [facility]    Report Status PENDING  Incomplete  Culture, sputum-assessment     Status: None   Collection Time: 03/13/14  7:25 AM  Result Value Ref Range Status    Specimen Description SPUTUM  Final   Special Requests NONE  Final   Sputum evaluation   Final    MICROSCOPIC FINDINGS SUGGEST THAT THIS SPECIMEN IS NOT REPRESENTATIVE OF LOWER RESPIRATORY SECRETIONS. PLEASE RECOLLECT. Gram Stain Report Called to,Read Back By and Verified With: NELLIE RN AT [phone] 03/13/14 BY K BARR    Report Status 03/13/2014 FINAL  Final  Blood culture (routine x 2)     Status: None (Preliminary result)   Collection Time: 03/15/14  9:08 AM  Result Value Ref Range Status   Specimen Description BLOOD RIGHT ANTECUBITAL  Final   Special Requests BOTTLES DRAWN AEROBIC AND ANAEROBIC 5CC  Final   Culture  Setup Time   Final    03/15/2014 14:48 Performed at [facility]    Culture   Final           BLOOD CULTURE RECEIVED NO GROWTH TO DATE CULTURE WILL BE HELD FOR 5 DAYS BEFORE ISSUING A FINAL NEGATIVE REPORT Performed at [facility]    Report Status PENDING  Incomplete  Blood culture (routine x 2)     Status: None (Preliminary result)   Collection Time: 03/15/14  9:14 AM  Result Value Ref Range Status   Specimen Description BLOOD RIGHT FOREARM  Final   Special Requests BOTTLES DRAWN AEROBIC AND ANAEROBIC 5CC  Final   Culture  Setup Time   Final    03/15/2014 14:47 Performed at [facility]    Culture   Final           BLOOD CULTURE RECEIVED NO GROWTH TO DATE CULTURE WILL BE HELD FOR 5 DAYS BEFORE ISSUING A FINAL NEGATIVE REPORT Performed at [facility]    Report Status PENDING  Incomplete       Today   Subjective:   Marisa Severin today has no headache,no chest abdominal pain,no new weakness tingling or numbness, feels much better wants to go home today   Objective:   Blood pressure 139/60, pulse 64, temperature 98.2 F (36.8 C), temperature source Oral, resp. rate 20, height 5\' 3"  (1.6 m), weight 77.474 kg (170 lb 12.8 oz), SpO2 97 %.   Intake/Output Summary (Last 24 hours) at 03/18/14 1100 Last data filed at 03/18/14 0902   Gross per 24 hour  Intake    923 ml  Output   1650 ml  Net   -727 ml    Exam Awake Alert, Oriented x 3, No new F.N deficits, Normal affect [place].AT,PERRAL Supple Neck,No JVD, No cervical lymphadenopathy appriciated.  Symmetrical Chest wall movement, Good air movement bilaterally, minimal basilar rales RRR,No Gallops,Rubs or new Murmurs, No Parasternal Heave +ve B.Sounds, Abd Soft, Non tender, No organomegaly appriciated, No rebound -guarding or rigidity. No Cyanosis, Clubbing or edema, No new Rash or bruise  Data Review   CBC w Diff: Lab Results  Component Value Date   WBC 9.2 03/16/2014   HGB 10.6* 03/16/2014   HCT 36.0 03/16/2014   PLT 203 03/16/2014   LYMPHOPCT 12 03/15/2014   MONOPCT 5 03/15/2014   EOSPCT 0 03/15/2014   BASOPCT 0 03/15/2014    CMP: Lab Results  Component Value Date   NA 141 03/18/2014   K 4.5 03/18/2014   CL 103 03/18/2014   CO2 29 03/18/2014   BUN 30* 03/18/2014   CREATININE 2.34* 03/18/2014   PROT 6.2 03/15/2014   ALBUMIN 3.1* 03/15/2014   BILITOT 0.3 03/15/2014   ALKPHOS 160* 03/15/2014   AST 15 03/15/2014   ALT 7 03/15/2014  .   Total Time in preparing paper work, data evaluation and todays exam - 35 minutes  Thurnell Lose M.D on 03/18/2014 at 11:00 AM  [facility] Office  [phone]

## 2014-03-19 LAB — CULTURE, BLOOD (ROUTINE X 2)
Culture: NO GROWTH
Culture: NO GROWTH

## 2014-03-21 LAB — CULTURE, BLOOD (ROUTINE X 2)
CULTURE: NO GROWTH
Culture: NO GROWTH

## 2014-04-07 ENCOUNTER — Encounter: Payer: Self-pay | Admitting: Physician Assistant

## 2014-04-07 ENCOUNTER — Telehealth: Payer: Self-pay | Admitting: *Deleted

## 2014-04-07 ENCOUNTER — Ambulatory Visit (INDEPENDENT_AMBULATORY_CARE_PROVIDER_SITE_OTHER): Payer: Medicare Other | Admitting: Physician Assistant

## 2014-04-07 VITALS — BP 120/68 | HR 66 | Ht 63.0 in | Wt 166.8 lb

## 2014-04-07 DIAGNOSIS — N184 Chronic kidney disease, stage 4 (severe): Secondary | ICD-10-CM

## 2014-04-07 DIAGNOSIS — I5032 Chronic diastolic (congestive) heart failure: Secondary | ICD-10-CM

## 2014-04-07 DIAGNOSIS — I482 Chronic atrial fibrillation, unspecified: Secondary | ICD-10-CM

## 2014-04-07 DIAGNOSIS — N189 Chronic kidney disease, unspecified: Secondary | ICD-10-CM

## 2014-04-07 DIAGNOSIS — E1122 Type 2 diabetes mellitus with diabetic chronic kidney disease: Secondary | ICD-10-CM

## 2014-04-07 DIAGNOSIS — I350 Nonrheumatic aortic (valve) stenosis: Secondary | ICD-10-CM

## 2014-04-07 NOTE — Patient Instructions (Signed)
Your physician recommends that you continue on your current medications as directed. Please refer to the Current Medication list given to you today.     KEEP FOLLOW UP APPT AS SCHEDULED WITH DR Radford Pax

## 2014-04-07 NOTE — Progress Notes (Addendum)
Cardiology Office Note  Date:  04/07/2014   Patient ID:  [name], [name] Jun 14, 1925, MRN [id number]  PCP:  Abigail Miyamoto, MD  Cardiologist: [name]  Chief Complaint: here for follow-up of CHF and atrial fibrillation  History of Present Illness: [name] is a 81 y.o. female who presents today for cardiology evaluation. She has a history of chronic diastolic CHF, CKD IV (baseline Cr 2.5-2.6), chronic AF (not anticoag candidate 2/2 falls/advanced age), remote colon cancer, anxiety, moderate AS by echo 10/2013, colon CA s/p colostomy who presents for post-hospital followup. She was admitted 12/23-12/26 with HCAP, acute respiratory failure and possible CHF. She responded well to IV Lasix.   Discharge weight 170lb, d/c Cr 2.3. Prior home dose of Lasix was 80mg  daily - she was discharged on Lasix 60mg  BID. She has since seen her PCP who reduced dose of Lasix to 40mg  BID due to progressive weight loss. She is feeling great since that visit and med change. Denies CP, dyspnea, presyncope, syncope, LEE. Appetite is gradually coming back. Family just went to the store and stocked her house with low-salt alternatives. She lives by herself but family stays with her constantly except for a few hours a day. She also has LifeAlert. She ambulates with a walker.  Past Medical History  Diagnosis Date  . CKD (chronic kidney disease), stage IV     a. Baseline Cr reported 2.5-2.6.  Marland Kitchen Anxiety   . Diabetes mellitus     diabetic retinopathy  . Anemia   . Moderate aortic stenosis     a. By echo 10/2013.  Marland Kitchen Urinary tract infection   . Fall at home   . Colostomy care     colon ca - h/o bloody output from bag 12/2012 (ED visit)  . Colon cancer 30 year ago  . Chronic atrial fibrillation     a. not on anticoagulation due to increased risk of falls, advanced age.  . Chronic diastolic CHF (congestive heart failure)     a. Echo 10/2013: mild LVH, EF 60-65%, mod AS, mildly dilated LA, mod dilated RA, PASP  33.  Marland Kitchen Hypertension   . Carotid disease, bilateral     a. Carotid duplex 09/1060: 69-48% RICA/LICA.  Marland Kitchen H/O cardiovascular stress test     a. 2002: nuc negative for ischemia, EF 65%.    Current Outpatient Prescriptions  Medication Sig Dispense Refill  . acetaminophen (TYLENOL) 500 MG tablet Take 500 mg by mouth every 6 (six) hours as needed for pain.     Marland Kitchen aspirin EC 81 MG tablet Take 81 mg by mouth daily.    . calcitRIOL (ROCALTROL) 0.25 MCG capsule Take 0.25 mcg by mouth daily.     . carvedilol (COREG) 25 MG tablet TAKE 1 TABLET (25 MG TOTAL) BY MOUTH 2 (TWO) TIMES DAILY WITH A MEAL. 60 tablet 3  . docusate sodium (COLACE) 100 MG capsule Take 100 mg by mouth daily as needed for constipation.     Marland Kitchen doxycycline (VIBRA-TABS) 100 MG tablet Take 1 tablet (100 mg total) by mouth every 12 (twelve) hours. 6 tablet 0  . furosemide (LASIX) 40 MG tablet Take 40 mg by mouth 2 (two) times daily.  1  . insulin glargine (LANTUS) 100 UNIT/ML injection Inject 8 Units into the skin at bedtime as needed (if cbg over 150).     Marland Kitchen linagliptin (TRADJENTA) 5 MG TABS tablet Take 5 mg by mouth daily.     Marland Kitchen LORazepam (ATIVAN) 0.5  MG tablet Take 0.5 mg by mouth 2 (two) times daily.    . meclizine (ANTIVERT) 12.5 MG tablet Take 12.5 mg by mouth as needed for dizziness.     . mirtazapine (REMERON) 7.5 MG tablet Take 7.5 mg by mouth at bedtime.     . phenazopyridine (PYRIDIUM) 100 MG tablet Take 100 mg by mouth as needed for pain.    . potassium chloride SA (K-DUR,KLOR-CON) 20 MEQ tablet Take 2 tablets (40 mEq total) by mouth daily. 60 tablet 3  . promethazine (PHENERGAN) 12.5 MG tablet Take 1 tablet (12.5 mg total) by mouth every 6 (six) hours as needed for nausea or vomiting. 30 tablet 0  . vitamin B-12 (CYANOCOBALAMIN) 1000 MCG tablet Take 1,000 mcg by mouth daily.     No current facility-administered medications for this visit.    Allergies:   Codeine; Keflet; Levaquin; Morphine and related; and Oxycodone    Social History:  The patient  reports that she has never smoked. Her smokeless tobacco use includes Snuff. She reports that she does not drink alcohol or use illicit drugs.   Family History:  The patient's family history includes Cancer in her son; Diabetes in her daughter, sister, and son; Heart disease in her father and son; Hyperlipidemia in her daughter and son; Hypertension in her daughter and son; Other in her daughter.  ROS:  Please see the history of present illness. No further cough. No orthopnea, LEE.  All other systems are reviewed and otherwise negative.   PHYSICAL EXAM: VS:  BP 120/68 mmHg  Pulse 66  Ht 5\' 3"  (1.6 m)  Wt 166 lb 12.8 oz (75.66 kg)  BMI 29.55 kg/m2 BMI: Body mass index is 29.55 kg/(m^2). Well nourished, well developed WF, in no acute distress HEENT: normocephalic, atraumatic Neck: no JVD Cardiac:  normal S1, S2; irregularly irregular but controlled; 2/6 SEM RUSB Lungs:  clear to auscultation bilaterally, no wheezing, rhonchi or rales Abd: soft, nontender, no hepatomegaly Ext: no edema Skin: warm and dry Neuro:  moves all extremities spontaneously, no focal abnormalities noted  EKG:  The EKG ordered today for atrial fibrillation shows: Atrial fibrillation 66bpm, LPFB, TW abnormalities avL, V2, no acute ST-T changes, no significant change from prior. No peaked T waves  Recent Labs: 03/15/2014: ALT 7; B Natriuretic Peptide 941.4* 03/16/2014: Hemoglobin 10.6*; Platelets 203 03/18/2014: BUN 30*; Creatinine 2.34*; Magnesium 2.1; Potassium 4.5; Sodium 141  No results found for requested labs within last 365 days.   Estimated Creatinine Clearance: 16.8 mL/min (by C-G formula based on Cr of 2.34).   Wt Readings from Last 3 Encounters:  04/07/14 166 lb 12.8 oz (75.66 kg)  03/18/14 170 lb 12.8 oz (77.474 kg)  03/14/14 174 lb 13.9 oz (79.32 kg)     Other studies reviewed: Additional studies/records reviewed today include: Echo 10/2013: mild LVH, EF 60-65%,  mod AS, mildly dilated LA, mod dilated RA, PASP 33. Repeat planned in 2 years. Carotid duplex 03/4479: 85-63% RICA/LICA. Remote nuc in 2002: negative for ischemia, EF 65%.  ASSESSMENT AND PLAN:  1. Chronic diastolic CHF with recent hospitalization for HCAP/acute resp failure - appears euvolemic. Agree with PCP decision to back down to prior dose of Lasix. Suspect CHF was exacerbated by pneumonia. The patient will continue her low salt diet. She weighs daily and we discussed notifying the office if weight begins to creep up. She does not drink excess fluid. Will ask Dr. Delman Kitten office to fax recent labs for our records. 2. Chronic AF -  not on anticoag due to fall risk and advanced age per prior notes. She also has prior h/o bleeding from her colostomy. Rate controlled. She is not bothered by this rhythm. 3. CKD stage IV - followed by Dr. Moshe Cipro. 4. Diabetes mellitus with CKD - managed by PCP. Insulin recently cut back by PCP. 5. Moderate AS by echo 10/2013 - repeat planned for 2 years per Dr. Theodosia Blender notes.  Disposition: F/u with [name] as previously planned in 04/2014.  Natasha Ache PA-C 04/07/2014 10:22 AM     [facility] [address] [place] [address] [phone] (office)  [phone] (fax)  Addendum: Labs from PCP reviewed. BMET 1/4 showed K 5.7, CR 3.48. F/u BMET 1/12 showed BUN 58/Cr 3.37, K 5.5, CO2 28, Cl 104. As above, agree with decreased Lasix dose from post-hospital dose. The patient said she has f/u next week with Dr. Delman Kitten office for further management and repeat labs. The patient does endorse that her appetite has come back the last few days and she is eating and drinking again which may help these values. I asked our nurse to contact Ms. Brow to verify what potassium dose she is taking and to make sure that they speak to Dr. Delman Kitten office today regarding the plan for her hyperkalemia. He apparently recently decreased her KCl  dose. They had not heard back from his office yet about this most recent set of [place]. Since her PCP, our office and nephrology are all on different EMRs, I would prefer that only one person is managing her lytes for continuity/safety reasons. The daughter plans to call Dr. Delman Kitten office today to discuss f/u plan for hyperkalemia. Ms. Crumrine also has f/u planned with nephrology in February -- I also recommended they try to move appointment with nephrology up.  Natasha Voorhies PA-C 11:40 AM 04/07/2014

## 2014-04-25 ENCOUNTER — Ambulatory Visit (INDEPENDENT_AMBULATORY_CARE_PROVIDER_SITE_OTHER): Payer: Medicare Other | Admitting: Cardiology

## 2014-04-25 ENCOUNTER — Encounter: Payer: Self-pay | Admitting: Cardiology

## 2014-04-25 VITALS — BP 146/78 | HR 76 | Ht 63.0 in | Wt 173.4 lb

## 2014-04-25 DIAGNOSIS — I10 Essential (primary) hypertension: Secondary | ICD-10-CM

## 2014-04-25 DIAGNOSIS — I482 Chronic atrial fibrillation, unspecified: Secondary | ICD-10-CM

## 2014-04-25 DIAGNOSIS — I5032 Chronic diastolic (congestive) heart failure: Secondary | ICD-10-CM

## 2014-04-25 DIAGNOSIS — I350 Nonrheumatic aortic (valve) stenosis: Secondary | ICD-10-CM

## 2014-04-25 DIAGNOSIS — I77810 Thoracic aortic ectasia: Secondary | ICD-10-CM

## 2014-04-25 NOTE — Progress Notes (Addendum)
Cardiology Office Note   Date:  04/25/2014   ID:  [name], DOB [birth year], MRN [id number]  PCP:  Abigail Miyamoto, MD  Cardiologist:   [name], MD   Chief Complaint  Patient presents with  . Congestive Heart Failure  . Aortic Stenosis  . Atrial Fibrillation  . Hypertension      History of Present Illness: [name] is a 81 y.o. female with a history of chronic diastolic CHF, HTN, chronic atrial fibrillation who presents today for followup. She is doing well. She denies any chest pain, SOB, DOE, palpitations, dizziness or syncope. She has chronic LE edema which is stable. She has had a cold this week.     Past Medical History  Diagnosis Date  . CKD (chronic kidney disease), stage IV     a. Baseline Cr reported 2.5-2.6.  Marland Kitchen Anxiety   . Diabetes mellitus     diabetic retinopathy  . Anemia   . Moderate aortic stenosis     a. By echo 10/2013.  Marland Kitchen Urinary tract infection   . Fall at home   . Colostomy care     colon ca - h/o bloody output from bag 12/2012 (ED visit)  . Colon cancer 30 year ago  . Chronic atrial fibrillation     a. not on anticoagulation due to increased risk of falls, advanced age.  . Chronic diastolic CHF (congestive heart failure)     a. Echo 10/2013: mild LVH, EF 60-65%, mod AS, mildly dilated LA, mod dilated RA, PASP 33.  Marland Kitchen Hypertension   . Carotid disease, bilateral     a. Carotid duplex 09/5100: 58-52% RICA/LICA.  Marland Kitchen H/O cardiovascular stress test     a. 2002: nuc negative for ischemia, EF 65%.    Past Surgical History  Procedure Laterality Date  . Abdominal surgery    . Abdominal hysterectomy    . Colon surgery    . Appendectomy    . Colostomy    . Cardioversion  09/30/2011    Procedure: CARDIOVERSION;  Surgeon: [name], MD;  Location: [facility];  Service: Cardiovascular;  Laterality: N/A;  . I&d extremity  11/20/2011    Procedure: IRRIGATION AND DEBRIDEMENT EXTREMITY;  Surgeon: Alta Corning, MD;  Location: [facility];  Service:  Orthopedics;  Laterality: Left;  . Av fistula placement Left 06/02/2012    Procedure: ARTERIOVENOUS (AV) FISTULA CREATION;  Surgeon: Conrad Grandview, MD;  Location: [facility];  Service: Vascular;  Laterality: Left;  Bascilic Fistula  . Bascilic vein transposition Left 08/18/2012    Procedure: BASCILIC VEIN TRANSPOSITION;  Surgeon: Conrad Heart Butte, MD;  Location: [facility];  Service: Vascular;  Laterality: Left;  Left 2nd stage Basilic Vein Transposition      Current Outpatient Prescriptions  Medication Sig Dispense Refill  . acetaminophen (TYLENOL) 500 MG tablet Take 500 mg by mouth every 6 (six) hours as needed for pain.     Marland Kitchen aspirin EC 81 MG tablet Take 81 mg by mouth daily.    . calcitRIOL (ROCALTROL) 0.25 MCG capsule Take 0.25 mcg by mouth daily.     . carvedilol (COREG) 25 MG tablet TAKE 1 TABLET (25 MG TOTAL) BY MOUTH 2 (TWO) TIMES DAILY WITH A MEAL. 60 tablet 3  . docusate sodium (COLACE) 100 MG capsule Take 100 mg by mouth daily as needed for constipation.     . furosemide (LASIX) 80 MG tablet Take 80 mg by mouth daily.  3  .  insulin glargine (LANTUS) 100 UNIT/ML injection Inject 8 Units into the skin at bedtime as needed (if cbg over 150).     Marland Kitchen linagliptin (TRADJENTA) 5 MG TABS tablet Take 5 mg by mouth daily.     Marland Kitchen LORazepam (ATIVAN) 0.5 MG tablet Take 0.5 mg by mouth 2 (two) times daily.    . meclizine (ANTIVERT) 12.5 MG tablet Take 12.5 mg by mouth as needed for dizziness.     . mirtazapine (REMERON) 7.5 MG tablet Take 7.5 mg by mouth at bedtime.     . phenazopyridine (PYRIDIUM) 100 MG tablet Take 100 mg by mouth as needed for pain.    . promethazine (PHENERGAN) 12.5 MG tablet Take 1 tablet (12.5 mg total) by mouth every 6 (six) hours as needed for nausea or vomiting. 30 tablet 0  . vitamin B-12 (CYANOCOBALAMIN) 1000 MCG tablet Take 1,000 mcg by mouth daily.     No current facility-administered medications for this visit.    Allergies:   Codeine; Keflet; Levaquin; Morphine and related; and  Oxycodone    Social History:  The patient  reports that she has never smoked. Her smokeless tobacco use includes Snuff. She reports that she does not drink alcohol or use illicit drugs.   Family History:  The patient's family history includes Cancer in her son; Diabetes in her daughter, sister, and son; Heart disease in her father and son; Hyperlipidemia in her daughter and son; Hypertension in her daughter and son; Other in her daughter.    ROS:  Please see the history of present illness.   Otherwise, review of systems are positive for none.   All other systems are reviewed and negative.    PHYSICAL EXAM: VS:  BP 146/78 mmHg  Pulse 76  Ht 5\' 3"  (1.6 m)  Wt 173 lb 6.4 oz (78.654 kg)  BMI 30.72 kg/m2  SpO2 98% , BMI Body mass index is 30.72 kg/(m^2). GEN: Well nourished, well developed, in no acute distress HEENT: normal Neck: no JVD, carotid bruits, or masses Cardiac: RRR; no rubs, or gallops,no edema .  2/6 mid systolic ejection murmur at RUSB to LLSB Respiratory:  clear to auscultation bilaterally, normal work of breathing GI: soft, nontender, nondistended, + BS MS: no deformity or atrophy Skin: warm and dry, no rash Neuro:  Strength and sensation are intact Psych: euthymic mood, full affect     Recent Labs: 03/15/2014: ALT 7; B Natriuretic Peptide 941.4* 03/16/2014: Hemoglobin 10.6*; Platelets 203 03/18/2014: BUN 30*; Creatinine 2.34*; Magnesium 2.1; Potassium 4.5; Sodium 141    Lipid Panel No results found for: CHOL, TRIG, HDL, CHOLHDL, VLDL, LDLCALC, LDLDIRECT    Wt Readings from Last 3 Encounters:  04/25/14 173 lb 6.4 oz (78.654 kg)  04/07/14 166 lb 12.8 oz (75.66 kg)  03/18/14 170 lb 12.8 oz (77.474 kg)     ASSESSMENT AND PLAN: 1. Chronic diastolic CHF - she appears euvolemic on exam 2. Mild AS by echo 2014 3. Chronic atrial fibrillation not a coumadin candidate. Rate controlled - continue carvedilol/ASA 4. HTN -fairly well controlled - continue  carvedilol  5. Chronic LE edema - improved with higher dose of lasix 6. Mildly dilated aortic root - repeat 2D echo to assess stability in June 7. Carotid artery stenosis - mild to moderate - recheck carotid dopplers.  Continue ASA.  I will get a copy of her most recent lipids by her PCP     Current medicines are reviewed at length with the patient today.  The patient  does not have concerns regarding medicines.  The following changes have been made:  no change  Labs/ tests ordered today include: 2D echo and carotid dopplers     Disposition:   FU with me in 6 months   Signed, [name], MD  04/25/2014 10:06 PM    [facility] [place], [place],   [address] Phone: [phone]; Fax: [phone]

## 2014-04-25 NOTE — Patient Instructions (Addendum)
Your physician recommends that you continue on your current medications as directed. Please refer to the Current Medication list given to you today.   Your physician has requested that you have a carotid duplex IN 08/2014. This test is an ultrasound of the carotid arteries in your neck. It looks at blood flow through these arteries that supply the brain with blood. Allow one hour for this exam. There are no restrictions or special instructions.   Your physician has requested that you have an echocardiogram. IN 08/2014  Echocardiography is a painless test that uses sound waves to create images of your heart. It provides your doctor with information about the size and shape of your heart and how well your heart's chambers and valves are working. This procedure takes approximately one hour. There are no restrictions for this procedure.  Your physician wants you to follow-up in:  6 MONTHS  You will receive a reminder letter in the mail two months in advance. If you don't receive a letter, please call our office to schedule the follow-up appointment.   GET A COPY OF YOUR LIPIDS AND ALT FROM PCP

## 2014-04-26 ENCOUNTER — Telehealth: Payer: Self-pay | Admitting: Cardiology

## 2014-04-26 DIAGNOSIS — E785 Hyperlipidemia, unspecified: Secondary | ICD-10-CM

## 2014-04-26 DIAGNOSIS — I350 Nonrheumatic aortic (valve) stenosis: Secondary | ICD-10-CM

## 2014-04-26 NOTE — Telephone Encounter (Signed)
Received labs from PCP and LDL is 93 and the goal is <70 with her PVD.  Please have her start Simvastatin 20mg  daily and recheck FLP and ALT in 6 weeks

## 2014-04-28 ENCOUNTER — Encounter: Payer: Self-pay | Admitting: Cardiology

## 2014-04-28 MED ORDER — SIMVASTATIN 20 MG PO TABS: 20.0000 mg | ORAL_TABLET | Freq: Every day | ORAL | Status: DC

## 2014-04-28 NOTE — Telephone Encounter (Signed)
This encounter was created in error - please disregard.

## 2014-04-28 NOTE — Telephone Encounter (Signed)
Instructed patient to START Simvastatin 20 mg daily. Fasting lab appointment scheduled for Mar. 23.  Patient agrees with treatment plan.

## 2014-04-28 NOTE — Addendum Note (Signed)
Addended by: Harland German A on: 04/28/2014 06:06 PM   Modules accepted: Orders

## 2014-04-28 NOTE — Telephone Encounter (Signed)
New problem   Pt's daughter stated labs results was only faxed over for lipids because pt didn't have any alt drawn,. Please advise daughter what pt need to do if alt's are needed.

## 2014-05-09 ENCOUNTER — Encounter (HOSPITAL_COMMUNITY): Payer: Self-pay | Admitting: Emergency Medicine

## 2014-05-09 ENCOUNTER — Ambulatory Visit (INDEPENDENT_AMBULATORY_CARE_PROVIDER_SITE_OTHER): Payer: Medicare Other

## 2014-05-09 ENCOUNTER — Inpatient Hospital Stay (HOSPITAL_COMMUNITY)
Admission: EM | Admit: 2014-05-09 | Discharge: 2014-05-16 | DRG: 408 | Disposition: A | Discharge status: Home / Self Care | Payer: Medicare Other | Attending: Internal Medicine | Admitting: Internal Medicine

## 2014-05-09 ENCOUNTER — Other Ambulatory Visit: Payer: Self-pay | Admitting: Family Medicine

## 2014-05-09 DIAGNOSIS — I482 Chronic atrial fibrillation, unspecified: Secondary | ICD-10-CM | POA: Diagnosis present

## 2014-05-09 DIAGNOSIS — Z7982 Long term (current) use of aspirin: Secondary | ICD-10-CM | POA: Diagnosis not present

## 2014-05-09 DIAGNOSIS — F17220 Nicotine dependence, chewing tobacco, uncomplicated: Secondary | ICD-10-CM | POA: Diagnosis present

## 2014-05-09 DIAGNOSIS — K660 Peritoneal adhesions (postprocedural) (postinfection): Secondary | ICD-10-CM | POA: Diagnosis present

## 2014-05-09 DIAGNOSIS — E876 Hypokalemia: Secondary | ICD-10-CM | POA: Diagnosis not present

## 2014-05-09 DIAGNOSIS — Z9049 Acquired absence of other specified parts of digestive tract: Secondary | ICD-10-CM | POA: Diagnosis present

## 2014-05-09 DIAGNOSIS — I5032 Chronic diastolic (congestive) heart failure: Secondary | ICD-10-CM

## 2014-05-09 DIAGNOSIS — R1011 Right upper quadrant pain: Secondary | ICD-10-CM

## 2014-05-09 DIAGNOSIS — K810 Acute cholecystitis: Principal | ICD-10-CM

## 2014-05-09 DIAGNOSIS — Z933 Colostomy status: Secondary | ICD-10-CM

## 2014-05-09 DIAGNOSIS — Z886 Allergy status to analgesic agent status: Secondary | ICD-10-CM | POA: Diagnosis not present

## 2014-05-09 DIAGNOSIS — F419 Anxiety disorder, unspecified: Secondary | ICD-10-CM | POA: Diagnosis present

## 2014-05-09 DIAGNOSIS — Z794 Long term (current) use of insulin: Secondary | ICD-10-CM | POA: Diagnosis not present

## 2014-05-09 DIAGNOSIS — E11319 Type 2 diabetes mellitus with unspecified diabetic retinopathy without macular edema: Secondary | ICD-10-CM | POA: Diagnosis present

## 2014-05-09 DIAGNOSIS — I5033 Acute on chronic diastolic (congestive) heart failure: Secondary | ICD-10-CM

## 2014-05-09 DIAGNOSIS — E1122 Type 2 diabetes mellitus with diabetic chronic kidney disease: Secondary | ICD-10-CM | POA: Diagnosis present

## 2014-05-09 DIAGNOSIS — I129 Hypertensive chronic kidney disease with stage 1 through stage 4 chronic kidney disease, or unspecified chronic kidney disease: Secondary | ICD-10-CM | POA: Diagnosis present

## 2014-05-09 DIAGNOSIS — D649 Anemia, unspecified: Secondary | ICD-10-CM | POA: Diagnosis present

## 2014-05-09 DIAGNOSIS — Z9181 History of falling: Secondary | ICD-10-CM

## 2014-05-09 DIAGNOSIS — E785 Hyperlipidemia, unspecified: Secondary | ICD-10-CM | POA: Diagnosis present

## 2014-05-09 DIAGNOSIS — Z85048 Personal history of other malignant neoplasm of rectum, rectosigmoid junction, and anus: Secondary | ICD-10-CM

## 2014-05-09 DIAGNOSIS — N184 Chronic kidney disease, stage 4 (severe): Secondary | ICD-10-CM | POA: Diagnosis present

## 2014-05-09 DIAGNOSIS — Z90710 Acquired absence of both cervix and uterus: Secondary | ICD-10-CM | POA: Diagnosis not present

## 2014-05-09 DIAGNOSIS — R0600 Dyspnea, unspecified: Secondary | ICD-10-CM

## 2014-05-09 DIAGNOSIS — Z885 Allergy status to narcotic agent status: Secondary | ICD-10-CM

## 2014-05-09 DIAGNOSIS — I10 Essential (primary) hypertension: Secondary | ICD-10-CM

## 2014-05-09 DIAGNOSIS — N189 Chronic kidney disease, unspecified: Secondary | ICD-10-CM | POA: Insufficient documentation

## 2014-05-09 DIAGNOSIS — E118 Type 2 diabetes mellitus with unspecified complications: Secondary | ICD-10-CM

## 2014-05-09 DIAGNOSIS — K819 Cholecystitis, unspecified: Secondary | ICD-10-CM

## 2014-05-09 DIAGNOSIS — Z85038 Personal history of other malignant neoplasm of large intestine: Secondary | ICD-10-CM | POA: Diagnosis not present

## 2014-05-09 DIAGNOSIS — R933 Abnormal findings on diagnostic imaging of other parts of digestive tract: Secondary | ICD-10-CM

## 2014-05-09 LAB — URINALYSIS, ROUTINE W REFLEX MICROSCOPIC
Bilirubin Urine: NEGATIVE
GLUCOSE, UA: NEGATIVE mg/dL
KETONES UR: NEGATIVE mg/dL
LEUKOCYTES UA: NEGATIVE
NITRITE: NEGATIVE
PH: 5 (ref 5.0–8.0)
PROTEIN: NEGATIVE mg/dL
Specific Gravity, Urine: 1.013 (ref 1.005–1.030)
Urobilinogen, UA: 1 mg/dL (ref 0.0–1.0)

## 2014-05-09 LAB — URINE MICROSCOPIC-ADD ON

## 2014-05-09 LAB — CBC WITH DIFFERENTIAL/PLATELET
BASOS PCT: 0 % (ref 0–1)
Basophils Absolute: 0 10*3/uL (ref 0.0–0.1)
EOS PCT: 0 % (ref 0–5)
Eosinophils Absolute: 0 10*3/uL (ref 0.0–0.7)
HEMATOCRIT: 31.4 % — AB (ref 36.0–46.0)
HEMOGLOBIN: 10 g/dL — AB (ref 12.0–15.0)
LYMPHS PCT: 10 % — AB (ref 12–46)
Lymphs Abs: 1.5 10*3/uL (ref 0.7–4.0)
MCH: 28.3 pg (ref 26.0–34.0)
MCHC: 31.8 g/dL (ref 30.0–36.0)
MCV: 89 fL (ref 78.0–100.0)
Monocytes Absolute: 1 10*3/uL (ref 0.1–1.0)
Monocytes Relative: 7 % (ref 3–12)
Neutro Abs: 12.2 10*3/uL — ABNORMAL HIGH (ref 1.7–7.7)
Neutrophils Relative %: 83 % — ABNORMAL HIGH (ref 43–77)
PLATELETS: 140 10*3/uL — AB (ref 150–400)
RBC: 3.53 MIL/uL — ABNORMAL LOW (ref 3.87–5.11)
RDW: 14.8 % (ref 11.5–15.5)
WBC: 14.8 10*3/uL — ABNORMAL HIGH (ref 4.0–10.5)

## 2014-05-09 LAB — LIPASE, BLOOD: LIPASE: 32 U/L (ref 11–59)

## 2014-05-09 LAB — COMPREHENSIVE METABOLIC PANEL
ALBUMIN: 2.9 g/dL — AB (ref 3.5–5.2)
ALT: 7 U/L (ref 0–35)
ANION GAP: 9 (ref 5–15)
AST: 15 U/L (ref 0–37)
Alkaline Phosphatase: 136 U/L — ABNORMAL HIGH (ref 39–117)
BILIRUBIN TOTAL: 1.4 mg/dL — AB (ref 0.3–1.2)
BUN: 39 mg/dL — AB (ref 6–23)
CO2: 25 mmol/L (ref 19–32)
CREATININE: 2.65 mg/dL — AB (ref 0.50–1.10)
Calcium: 8 mg/dL — ABNORMAL LOW (ref 8.4–10.5)
Chloride: 100 mmol/L (ref 96–112)
GFR calc non Af Amer: 15 mL/min — ABNORMAL LOW (ref >90)
GFR, EST AFRICAN AMERICAN: 18 mL/min — AB (ref >90)
Glucose, Bld: 169 mg/dL — ABNORMAL HIGH (ref 70–99)
Potassium: 3 mmol/L — ABNORMAL LOW (ref 3.5–5.1)
Sodium: 134 mmol/L — ABNORMAL LOW (ref 135–145)
Total Protein: 5.9 g/dL — ABNORMAL LOW (ref 6.0–8.3)

## 2014-05-09 LAB — CBG MONITORING, ED: GLUCOSE-CAPILLARY: 164 mg/dL — AB (ref 70–99)

## 2014-05-09 MED ORDER — SODIUM CHLORIDE 0.9 % IV SOLN
INTRAVENOUS | Status: DC
  Administered 2014-05-09: 125 mL/h via INTRAVENOUS

## 2014-05-09 NOTE — Consult Note (Signed)
Reason for Consult:  RUQ abdominal pain Referring Physician: [facility] - Fransico Him  [name] is an 81 y.o. female.  HPI: This is an 81 yo female with an extensive past medical/ surgical history who presents with several years of intermittent RUQ abdominal pain.  She has had a previous work-up with ultrasound and HIDA scan which showed that her GB EF was low normal at 37%.  She has had three days of RUQ abdominal pain, increased flatulence, looser colostomy output, nausea, and vomiting.  She went to see her primary care physician earlier and he sent her for an ultrasound.  Apparently, she was told to come to the ED for further evaluation and management.    She was recently hospitalized for pneumonia and hypoxia.  She was seen by her cardiologist two weeks ago.  She had a rectal cancer for which she underwent APR 30-35 years ago.  She also had a peristomal hernia that was repaired in 1992 and was apparently complicated by post-op peritonitis.  Records are not available for that event.  In 2002, she developed a second colon cancer in her right colon and underwent right hemicolectomy/ repair of peristomal hernia.  The operative report then noted extensive intra-abdominal adhesions.  Past Medical History  Diagnosis Date  . CKD (chronic kidney disease), stage IV     a. Baseline Cr reported 2.5-2.6.  Marland Kitchen Anxiety   . Diabetes mellitus     diabetic retinopathy  . Anemia   . Moderate aortic stenosis     a. By echo 10/2013.  Marland Kitchen Urinary tract infection   . Fall at home   . Colostomy care     colon ca - h/o bloody output from bag 12/2012 (ED visit)  . Colon cancer 30 year ago  . Chronic atrial fibrillation     a. not on anticoagulation due to increased risk of falls, advanced age.  . Chronic diastolic CHF (congestive heart failure)     a. Echo 10/2013: mild LVH, EF 60-65%, mod AS, mildly dilated LA, mod dilated RA, PASP 33.  Marland Kitchen Hypertension   . Carotid disease, bilateral      a. Carotid duplex 09/3417: 37-90% RICA/LICA.  Marland Kitchen H/O cardiovascular stress test     a. 2002: nuc negative for ischemia, EF 65%.    Past Surgical History  Procedure Laterality Date  . Abdominal surgery    . Abdominal hysterectomy    . Colon surgery    . Appendectomy    . Colostomy    . Cardioversion  09/30/2011    Procedure: CARDIOVERSION;  Surgeon: [name], MD;  Location: [place];  Service: Cardiovascular;  Laterality: N/A;  . I&d extremity  11/20/2011    Procedure: IRRIGATION AND DEBRIDEMENT EXTREMITY;  Surgeon: Alta Corning, MD;  Location: [place];  Service: Orthopedics;  Laterality: Left;  . Av fistula placement Left 06/02/2012    Procedure: ARTERIOVENOUS (AV) FISTULA CREATION;  Surgeon: Conrad Craig, MD;  Location: [place];  Service: Vascular;  Laterality: Left;  Bascilic Fistula  . Bascilic vein transposition Left 08/18/2012    Procedure: BASCILIC VEIN TRANSPOSITION;  Surgeon: Conrad Howard, MD;  Location: [facility] OR;  Service: Vascular;  Laterality: Left;  Left 2nd stage Basilic Vein Transposition     Family History  Problem Relation Age of Onset  . Heart disease Father   . Diabetes Sister   . Diabetes Daughter   . Hyperlipidemia Daughter   . Hypertension  Daughter   . Other Daughter     varicose veins  . Cancer Son   . Diabetes Son   . Heart disease Son     before age 40  . Hyperlipidemia Son   . Hypertension Son     Social History:  reports that she has never smoked. Her smokeless tobacco use includes Snuff. She reports that she does not drink alcohol or use illicit drugs.  Allergies:  Allergies  Allergen Reactions  . Codeine Nausea Only  . Keflet [Cephalexin] Nausea Only  . Levaquin [Levofloxacin In D5w] Nausea Only  . Morphine And Related Nausea Only  . Oxycodone Other (See Comments)    Abnormal behavior    Medications:  Prior to Admission medications   Medication Sig Start Date End Date Taking? Authorizing Provider  acetaminophen (TYLENOL) 500 MG tablet Take  500 mg by mouth every 6 (six) hours as needed for pain.    Yes Historical Provider, MD  aspirin EC 81 MG tablet Take 81 mg by mouth daily.   Yes Historical Provider, MD  calcitRIOL (ROCALTROL) 0.25 MCG capsule Take 0.25 mcg by mouth daily.  04/06/12  Yes Historical Provider, MD  carvedilol (COREG) 25 MG tablet TAKE 1 TABLET (25 MG TOTAL) BY MOUTH 2 (TWO) TIMES DAILY WITH A MEAL. 03/13/14  Yes [name], MD  docusate sodium (COLACE) 100 MG capsule Take 100 mg by mouth daily as needed for constipation.    Yes Historical Provider, MD  furosemide (LASIX) 80 MG tablet Take 80 mg by mouth daily. 04/10/14  Yes Historical Provider, MD  insulin glargine (LANTUS) 100 UNIT/ML injection Inject 8 Units into the skin at bedtime as needed (if cbg over 150).    Yes Historical Provider, MD  linagliptin (TRADJENTA) 5 MG TABS tablet Take 5 mg by mouth daily.    Yes Historical Provider, MD  LORazepam (ATIVAN) 0.5 MG tablet Take 0.5 mg by mouth 2 (two) times daily.   Yes Historical Provider, MD  meclizine (ANTIVERT) 12.5 MG tablet Take 12.5 mg by mouth as needed for dizziness.  11/23/12  Yes Historical Provider, MD  mirtazapine (REMERON) 7.5 MG tablet Take 7.5 mg by mouth at bedtime.  09/28/12  Yes Historical Provider, MD  promethazine (PHENERGAN) 12.5 MG tablet Take 1 tablet (12.5 mg total) by mouth every 6 (six) hours as needed for nausea or vomiting. 03/14/14  Yes Ripudeep Krystal Eaton, MD  simvastatin (ZOCOR) 20 MG tablet Take 1 tablet (20 mg total) by mouth at bedtime. 04/28/14  Yes [name], MD  vitamin B-12 (CYANOCOBALAMIN) 1000 MCG tablet Take 1,000 mcg by mouth daily.   Yes Historical Provider, MD     Results for orders placed or performed during the hospital encounter of 05/09/14 (from the past 48 hour(s))  CBG monitoring, ED     Status: Abnormal   Collection Time: 05/09/14  8:35 PM  Result Value Ref Range   Glucose-Capillary 164 (H) 70 - 99 mg/dL  CBC with Differential     Status: Abnormal   Collection  Time: 05/09/14  8:43 PM  Result Value Ref Range   WBC 14.8 (H) 4.0 - 10.5 K/uL   RBC 3.53 (L) 3.87 - 5.11 MIL/uL   Hemoglobin 10.0 (L) 12.0 - 15.0 g/dL   HCT 31.4 (L) 36.0 - 46.0 %   MCV 89.0 78.0 - 100.0 fL   MCH 28.3 26.0 - 34.0 pg   MCHC 31.8 30.0 - 36.0 g/dL   RDW 14.8 11.5 - 15.5 %  Platelets 140 (L) 150 - 400 K/uL   Neutrophils Relative % 83 (H) 43 - 77 %   Neutro Abs 12.2 (H) 1.7 - 7.7 K/uL   Lymphocytes Relative 10 (L) 12 - 46 %   Lymphs Abs 1.5 0.7 - 4.0 K/uL   Monocytes Relative 7 3 - 12 %   Monocytes Absolute 1.0 0.1 - 1.0 K/uL   Eosinophils Relative 0 0 - 5 %   Eosinophils Absolute 0.0 0.0 - 0.7 K/uL   Basophils Relative 0 0 - 1 %   Basophils Absolute 0.0 0.0 - 0.1 K/uL  Comprehensive metabolic panel     Status: Abnormal   Collection Time: 05/09/14  8:43 PM  Result Value Ref Range   Sodium 134 (L) 135 - 145 mmol/L   Potassium 3.0 (L) 3.5 - 5.1 mmol/L   Chloride 100 96 - 112 mmol/L   CO2 25 19 - 32 mmol/L   Glucose, Bld 169 (H) 70 - 99 mg/dL   BUN 39 (H) 6 - 23 mg/dL   Creatinine, Ser 2.65 (H) 0.50 - 1.10 mg/dL   Calcium 8.0 (L) 8.4 - 10.5 mg/dL   Total Protein 5.9 (L) 6.0 - 8.3 g/dL   Albumin 2.9 (L) 3.5 - 5.2 g/dL   AST 15 0 - 37 U/L   ALT 7 0 - 35 U/L   Alkaline Phosphatase 136 (H) 39 - 117 U/L   Total Bilirubin 1.4 (H) 0.3 - 1.2 mg/dL   GFR calc non Af Amer 15 (L) >90 mL/min   GFR calc Af Amer 18 (L) >90 mL/min    Comment: (NOTE) The eGFR has been calculated using the CKD EPI equation. This calculation has not been validated in all clinical situations. eGFR's persistently <90 mL/min signify possible Chronic Kidney Disease.    Anion gap 9 5 - 15  Lipase, blood     Status: None   Collection Time: 05/09/14  8:43 PM  Result Value Ref Range   Lipase 32 11 - 59 U/L  Urinalysis, Routine w reflex microscopic     Status: Abnormal   Collection Time: 05/09/14  8:49 PM  Result Value Ref Range   Color, Urine YELLOW YELLOW   APPearance CLEAR CLEAR   Specific  Gravity, Urine 1.013 1.005 - 1.030   pH 5.0 5.0 - 8.0   Glucose, UA NEGATIVE NEGATIVE mg/dL   Hgb urine dipstick TRACE (A) NEGATIVE   Bilirubin Urine NEGATIVE NEGATIVE   Ketones, ur NEGATIVE NEGATIVE mg/dL   Protein, ur NEGATIVE NEGATIVE mg/dL   Urobilinogen, UA 1.0 0.0 - 1.0 mg/dL   Nitrite NEGATIVE NEGATIVE   Leukocytes, UA NEGATIVE NEGATIVE  Urine microscopic-add on     Status: Abnormal   Collection Time: 05/09/14  8:49 PM  Result Value Ref Range   Squamous Epithelial / LPF RARE RARE   WBC, UA 0-2 <3 WBC/hpf   RBC / HPF 3-6 <3 RBC/hpf   Bacteria, UA FEW (A) RARE   Urine-Other AMORPHOUS URATES/PHOSPHATES     US Abdomen Complete  05/09/2014   CLINICAL DATA:  Right upper quadrant pain.  EXAM: ULTRASOUND ABDOMEN COMPLETE  COMPARISON:  None.  FINDINGS: Gallbladder: Gallbladder is enlarged with sludge. Gallbladder wall thickening to 5.2 mm diffuse positive Murphy sign. These findings consistent with cholecystitis.  Common bile duct: Diameter: 2.4 mm  Liver: No focal lesion identified. Within normal limits in parenchymal echogenicity.  IVC: No abnormality visualized.  Pancreas: Not visualized due to overlying bowel gas.  Spleen: Size and appearance within  normal limits.  Right Kidney: Length: 9.2 cm. Diffuse renal cortical thinning. No mass or hydronephrosis.  Left Kidney:  Obscured by bowel gas.  Abdominal aorta: No aneurysm visualized.  Other findings: None.  IMPRESSION: Enlarged gallbladder with sludge. Gallbladder wall thickening and positive Murphy's sign. These findings are consistent with cholecystitis.   Electronically Signed   By: Marcello Moores  Register   On: 05/09/2014 15:32    Review of Systems  Constitutional: Negative for weight loss.  HENT: Negative for ear discharge, ear pain, hearing loss and tinnitus.   Eyes: Negative for blurred vision, double vision, photophobia and pain.  Respiratory: Negative for cough, sputum production and shortness of breath.   Cardiovascular: Negative for  chest pain.  Gastrointestinal: Positive for nausea, vomiting and abdominal pain.  Genitourinary: Negative for dysuria, urgency, frequency and flank pain.  Musculoskeletal: Negative for myalgias, back pain, joint pain, falls and neck pain.  Neurological: Negative for dizziness, tingling, sensory change, focal weakness, loss of consciousness and headaches.  Endo/Heme/Allergies: Does not bruise/bleed easily.  Psychiatric/Behavioral: Negative for depression, memory loss and substance abuse. The patient is not nervous/anxious.    Blood pressure 103/53, pulse 76, temperature 100.3 F (37.9 C), temperature source Oral, resp. rate 20, SpO2 99 %. Physical Exam WDWN in NAD HEENT:  EOMI, sclera anicteric Neck:  No masses, no thyromegaly Lungs:  CTA bilaterally; normal respiratory effort CV:  Regular rate and rhythm; no murmurs Abd:  +bowel sounds, obese, healed midline incision; RUQ tenderness.  Colostomy in LLQ with slight bulging around ostomy - greenish stool output Ext:  Well-perfused; no edema Skin:  Warm, dry; no sign of jaundice  Assessment/Plan: 1.  Probable acalculous cholecystitis with tenderness/ elevated bilirubin 2.  Possible recurrent peristomal hernia around long-standing descending colostomy 3.  Extensive past medical history - elevated risk for perioperative complications   Recs:  Would try to treat her conservatively - NPO, IV hydration, IV antibiotics (Zosyn) Repeat liver function tests in 24 hours.  With her past surgical history of two colon operations, she would be at elevated risk for need to convert to open cholecystectomy if we were to pursue surgical intervention.  Would go ahead and ask cardiology to clear her in case she does not improve with conservative therapy and needs surgery.    She probably has a peristomal hernia but is not tender around her colostomy and does not seem to be obstructed. We will continue to follow.   Oland Arquette K. 05/09/2014, 11:55 PM

## 2014-05-09 NOTE — ED Notes (Signed)
Pt. reports emesis with intermittent right abdominal pain onset Sunday this week , denies diarrhea , no fever or chills.

## 2014-05-09 NOTE — ED Provider Notes (Signed)
CSN: [id number]     Arrival date & time 05/09/14  2009 History  This chart was scribed for Dorie Rank, MD by Delphia Grates, ED Scribe. This patient was seen in room A13C/A13C and the patient's care was started at 11:04 PM.   Chief Complaint  Patient presents with  . Emesis    The history is provided by the patient. No language interpreter was used.     HPI Comments: AMERIA SANJURJO is a 81 y.o. female, with history of CKD, DM, HTN, who presents to the Emergency Department complaining of intermittent emesis that has been ongoing for the past 2 days. Patient states she was evaluated by her PCP today, and was diagnosed with cholecystitis. There is associated RUQ abdominal pain that is worsened with movement. She reports she has not vomited since yesterday. Patient is not able to tolerate po and states she has not eaten since onset of symptoms. She reports her last fluid intake was today. Patient denies fever earlier today, but was informed her temperature at triage was 100 F. Patient currently takes a baby aspirin. She denies diarrhea, chest pain, or urinary symptoms.    Past Medical History  Diagnosis Date  . CKD (chronic kidney disease), stage IV     a. Baseline Cr reported 2.5-2.6.  Marland Kitchen Anxiety   . Diabetes mellitus     diabetic retinopathy  . Anemia   . Moderate aortic stenosis     a. By echo 10/2013.  Marland Kitchen Urinary tract infection   . Fall at home   . Colostomy care     colon ca - h/o bloody output from bag 12/2012 (ED visit)  . Colon cancer 30 year ago  . Chronic atrial fibrillation     a. not on anticoagulation due to increased risk of falls, advanced age.  . Chronic diastolic CHF (congestive heart failure)     a. Echo 10/2013: mild LVH, EF 60-65%, mod AS, mildly dilated LA, mod dilated RA, PASP 33.  Marland Kitchen Hypertension   . Carotid disease, bilateral     a. Carotid duplex 0/2409: 73-53% RICA/LICA.  Marland Kitchen H/O cardiovascular stress test     a. 2002: nuc negative for ischemia, EF 65%.   Past  Surgical History  Procedure Laterality Date  . Abdominal surgery    . Abdominal hysterectomy    . Colon surgery    . Appendectomy    . Colostomy    . Cardioversion  09/30/2011    Procedure: CARDIOVERSION;  Surgeon: [name], MD;  Location: [place];  Service: Cardiovascular;  Laterality: N/A;  . I&d extremity  11/20/2011    Procedure: IRRIGATION AND DEBRIDEMENT EXTREMITY;  Surgeon: Alta Corning, MD;  Location: [place];  Service: Orthopedics;  Laterality: Left;  . Av fistula placement Left 06/02/2012    Procedure: ARTERIOVENOUS (AV) FISTULA CREATION;  Surgeon: Conrad [place], MD;  Location: [place];  Service: Vascular;  Laterality: Left;  Bascilic Fistula  . Bascilic vein transposition Left 08/18/2012    Procedure: BASCILIC VEIN TRANSPOSITION;  Surgeon: Conrad Harlowton, MD;  Location: [facility] OR;  Service: Vascular;  Laterality: Left;  Left 2nd stage Basilic Vein Transposition    Family History  Problem Relation Age of Onset  . Heart disease Father   . Diabetes Sister   . Diabetes Daughter   . Hyperlipidemia Daughter   . Hypertension Daughter   . Other Daughter     varicose veins  . Cancer Son   . Diabetes Son   .  Heart disease Son     before age 60  . Hyperlipidemia Son   . Hypertension Son    History  Substance Use Topics  . Smoking status: Never Smoker   . Smokeless tobacco: Current User    Types: Snuff  . Alcohol Use: No   OB History    No data available     Review of Systems  A complete 10 system review of systems was obtained and all systems are negative except as noted in the HPI and PMH.    Allergies  Codeine; Keflet; Levaquin; Morphine and related; and Oxycodone  Home Medications   Prior to Admission medications   Medication Sig Start Date End Date Taking? Authorizing Provider  acetaminophen (TYLENOL) 500 MG tablet Take 500 mg by mouth every 6 (six) hours as needed for pain.    Yes Historical Provider, MD  aspirin EC 81 MG tablet Take 81 mg by mouth daily.   Yes  Historical Provider, MD  calcitRIOL (ROCALTROL) 0.25 MCG capsule Take 0.25 mcg by mouth daily.  04/06/12  Yes Historical Provider, MD  carvedilol (COREG) 25 MG tablet TAKE 1 TABLET (25 MG TOTAL) BY MOUTH 2 (TWO) TIMES DAILY WITH A MEAL. 03/13/14  Yes [name], MD  docusate sodium (COLACE) 100 MG capsule Take 100 mg by mouth daily as needed for constipation.    Yes Historical Provider, MD  furosemide (LASIX) 80 MG tablet Take 80 mg by mouth daily. 04/10/14  Yes Historical Provider, MD  insulin glargine (LANTUS) 100 UNIT/ML injection Inject 8 Units into the skin at bedtime as needed (if cbg over 150).    Yes Historical Provider, MD  linagliptin (TRADJENTA) 5 MG TABS tablet Take 5 mg by mouth daily.    Yes Historical Provider, MD  LORazepam (ATIVAN) 0.5 MG tablet Take 0.5 mg by mouth 2 (two) times daily.   Yes Historical Provider, MD  meclizine (ANTIVERT) 12.5 MG tablet Take 12.5 mg by mouth as needed for dizziness.  11/23/12  Yes Historical Provider, MD  mirtazapine (REMERON) 7.5 MG tablet Take 7.5 mg by mouth at bedtime.  09/28/12  Yes Historical Provider, MD  promethazine (PHENERGAN) 12.5 MG tablet Take 1 tablet (12.5 mg total) by mouth every 6 (six) hours as needed for nausea or vomiting. 03/14/14  Yes Ripudeep Krystal Eaton, MD  simvastatin (ZOCOR) 20 MG tablet Take 1 tablet (20 mg total) by mouth at bedtime. 04/28/14  Yes [name], MD  vitamin B-12 (CYANOCOBALAMIN) 1000 MCG tablet Take 1,000 mcg by mouth daily.   Yes Historical Provider, MD   Triage Vitals: BP 127/68 mmHg  Pulse 82  Temp(Src) 100.3 F (37.9 C) (Oral)  Resp 20  SpO2 95%  Physical Exam  Constitutional: She appears well-developed and well-nourished. No distress.  HENT:  Head: Normocephalic and atraumatic.  Right Ear: External ear normal.  Left Ear: External ear normal.  Eyes: Conjunctivae are normal. Right eye exhibits no discharge. Left eye exhibits no discharge. No scleral icterus.  Neck: Neck supple. No tracheal deviation  present.  Cardiovascular: Normal rate, regular rhythm and intact distal pulses.   Murmur heard.  Systolic murmur is present  Pulmonary/Chest: Effort normal and breath sounds normal. No stridor. No respiratory distress. She has no wheezes. She has no rales.  Abdominal: Soft. Bowel sounds are normal. She exhibits no distension and no mass. There is tenderness in the right upper quadrant and epigastric area. There is guarding. There is no rigidity and no rebound.  Musculoskeletal: She  exhibits edema. She exhibits no tenderness.  Neurological: She is alert. She has normal strength. No cranial nerve deficit (no facial droop, extraocular movements intact, no slurred speech) or sensory deficit. She exhibits normal muscle tone. She displays no seizure activity. Coordination normal.  Skin: Skin is warm and dry. No rash noted.  Psychiatric: She has a normal mood and affect.  Nursing note and vitals reviewed.   ED Course  Procedures (including critical care time)  DIAGNOSTIC STUDIES: Oxygen Saturation is 95% on roomair, adequate by my interpretation.    COORDINATION OF CARE: At 2310 Discussed treatment plan with patient. Patient agrees.   Labs Review Labs Reviewed  CBC WITH DIFFERENTIAL/PLATELET - Abnormal; Notable for the following:    WBC 14.8 (*)    RBC 3.53 (*)    Hemoglobin 10.0 (*)    HCT 31.4 (*)    Platelets 140 (*)    Neutrophils Relative % 83 (*)    Neutro Abs 12.2 (*)    Lymphocytes Relative 10 (*)    All other components within normal limits  COMPREHENSIVE METABOLIC PANEL - Abnormal; Notable for the following:    Sodium 134 (*)    Potassium 3.0 (*)    Glucose, Bld 169 (*)    BUN 39 (*)    Creatinine, Ser 2.65 (*)    Calcium 8.0 (*)    Total Protein 5.9 (*)    Albumin 2.9 (*)    Alkaline Phosphatase 136 (*)    Total Bilirubin 1.4 (*)    GFR calc non Af Amer 15 (*)    GFR calc Af Amer 18 (*)    All other components within normal limits  URINALYSIS, ROUTINE W REFLEX  MICROSCOPIC - Abnormal; Notable for the following:    Hgb urine dipstick TRACE (*)    All other components within normal limits  URINE MICROSCOPIC-ADD ON - Abnormal; Notable for the following:    Bacteria, UA FEW (*)    All other components within normal limits  CBG MONITORING, ED - Abnormal; Notable for the following:    Glucose-Capillary 164 (*)    All other components within normal limits  LIPASE, BLOOD    Imaging Review US Abdomen Complete  05/09/2014   CLINICAL DATA:  Right upper quadrant pain.  EXAM: ULTRASOUND ABDOMEN COMPLETE  COMPARISON:  None.  FINDINGS: Gallbladder: Gallbladder is enlarged with sludge. Gallbladder wall thickening to 5.2 mm diffuse positive Murphy sign. These findings consistent with cholecystitis.  Common bile duct: Diameter: 2.4 mm  Liver: No focal lesion identified. Within normal limits in parenchymal echogenicity.  IVC: No abnormality visualized.  Pancreas: Not visualized due to overlying bowel gas.  Spleen: Size and appearance within normal limits.  Right Kidney: Length: 9.2 cm. Diffuse renal cortical thinning. No mass or hydronephrosis.  Left Kidney:  Obscured by bowel gas.  Abdominal aorta: No aneurysm visualized.  Other findings: None.  IMPRESSION: Enlarged gallbladder with sludge. Gallbladder wall thickening and positive Murphy's sign. These findings are consistent with cholecystitis.   Electronically Signed   By: Marcello Moores  Register   On: 05/09/2014 15:32     MDM   Final diagnoses:  Cholecystitis  Chronic kidney disease, unspecified stage  Type 2 diabetes mellitus with diabetic chronic kidney disease    Pt presents to the ED with an outpatient ultrasound that suggests cholecystitis.  Pt has remained npo.  Comfortable now as long as she is not moving.  Discussed with Dr Georgette Dover.  Will see patient in the ED.  Pt has  multiple medical problems.  Will consult with hospitalist.  I personally performed the services described in this documentation, which was  scribed in my presence.  The recorded information has been reviewed and is accurate.   Dorie Rank, MD 05/09/14 [phone]

## 2014-05-09 NOTE — ED Notes (Signed)
Family at bedside. 

## 2014-05-10 ENCOUNTER — Encounter (HOSPITAL_COMMUNITY): Payer: Self-pay | Admitting: *Deleted

## 2014-05-10 DIAGNOSIS — N189 Chronic kidney disease, unspecified: Secondary | ICD-10-CM | POA: Insufficient documentation

## 2014-05-10 DIAGNOSIS — E1122 Type 2 diabetes mellitus with diabetic chronic kidney disease: Secondary | ICD-10-CM | POA: Insufficient documentation

## 2014-05-10 LAB — GLUCOSE, CAPILLARY
GLUCOSE-CAPILLARY: 136 mg/dL — AB (ref 70–99)
GLUCOSE-CAPILLARY: 99 mg/dL (ref 70–99)
Glucose-Capillary: 107 mg/dL — ABNORMAL HIGH (ref 70–99)
Glucose-Capillary: 152 mg/dL — ABNORMAL HIGH (ref 70–99)
Glucose-Capillary: 91 mg/dL (ref 70–99)

## 2014-05-10 LAB — COMPREHENSIVE METABOLIC PANEL
ALK PHOS: 126 U/L — AB (ref 39–117)
AST: 13 U/L (ref 0–37)
Albumin: 2.5 g/dL — ABNORMAL LOW (ref 3.5–5.2)
Anion gap: 8 (ref 5–15)
BUN: 41 mg/dL — ABNORMAL HIGH (ref 6–23)
CALCIUM: 8 mg/dL — AB (ref 8.4–10.5)
CHLORIDE: 105 mmol/L (ref 96–112)
CO2: 25 mmol/L (ref 19–32)
Creatinine, Ser: 2.59 mg/dL — ABNORMAL HIGH (ref 0.50–1.10)
GFR calc Af Amer: 18 mL/min — ABNORMAL LOW (ref >90)
GFR, EST NON AFRICAN AMERICAN: 16 mL/min — AB (ref >90)
GLUCOSE: 139 mg/dL — AB (ref 70–99)
Potassium: 3.2 mmol/L — ABNORMAL LOW (ref 3.5–5.1)
SODIUM: 138 mmol/L (ref 135–145)
Total Bilirubin: 1.1 mg/dL (ref 0.3–1.2)
Total Protein: 6.1 g/dL (ref 6.0–8.3)

## 2014-05-10 LAB — CBC
HCT: 29.5 % — ABNORMAL LOW (ref 36.0–46.0)
Hemoglobin: 9.4 g/dL — ABNORMAL LOW (ref 12.0–15.0)
MCH: 28.7 pg (ref 26.0–34.0)
MCHC: 31.9 g/dL (ref 30.0–36.0)
MCV: 89.9 fL (ref 78.0–100.0)
Platelets: 128 10*3/uL — ABNORMAL LOW (ref 150–400)
RBC: 3.28 MIL/uL — AB (ref 3.87–5.11)
RDW: 14.7 % (ref 11.5–15.5)
WBC: 11.9 10*3/uL — AB (ref 4.0–10.5)

## 2014-05-10 LAB — OCCULT BLOOD X 1 CARD TO LAB, STOOL: Fecal Occult Bld: POSITIVE — AB

## 2014-05-10 MED ORDER — POTASSIUM CHLORIDE 10 MEQ/100ML IV SOLN
10.0000 meq | INTRAVENOUS | Status: AC
  Administered 2014-05-10 (×4): 10 meq via INTRAVENOUS
  Filled 2014-05-10 (×4): qty 100

## 2014-05-10 MED ORDER — CARVEDILOL 25 MG PO TABS
25.0000 mg | ORAL_TABLET | Freq: Two times a day (BID) | ORAL | Status: DC
  Administered 2014-05-10 – 2014-05-16 (×13): 25 mg via ORAL
  Filled 2014-05-10 (×13): qty 1

## 2014-05-10 MED ORDER — HEPARIN SODIUM (PORCINE) 5000 UNIT/ML IJ SOLN
5000.0000 [IU] | Freq: Three times a day (TID) | INTRAMUSCULAR | Status: DC
  Administered 2014-05-10 – 2014-05-11 (×4): 5000 [IU] via SUBCUTANEOUS
  Filled 2014-05-10 (×3): qty 1

## 2014-05-10 MED ORDER — LINAGLIPTIN 5 MG PO TABS
5.0000 mg | ORAL_TABLET | Freq: Every day | ORAL | Status: DC
  Administered 2014-05-10 – 2014-05-16 (×7): 5 mg via ORAL
  Filled 2014-05-10 (×7): qty 1

## 2014-05-10 MED ORDER — INSULIN ASPART 100 UNIT/ML ~~LOC~~ SOLN
0.0000 [IU] | Freq: Three times a day (TID) | SUBCUTANEOUS | Status: DC
  Administered 2014-05-10: 2 [IU] via SUBCUTANEOUS
  Administered 2014-05-10: 3 [IU] via SUBCUTANEOUS
  Administered 2014-05-11: 2 [IU] via SUBCUTANEOUS
  Administered 2014-05-12: 3 [IU] via SUBCUTANEOUS
  Administered 2014-05-13 – 2014-05-16 (×6): 2 [IU] via SUBCUTANEOUS

## 2014-05-10 MED ORDER — ADULT MULTIVITAMIN W/MINERALS CH
1.0000 | ORAL_TABLET | Freq: Every day | ORAL | Status: DC
  Administered 2014-05-10 – 2014-05-16 (×7): 1 via ORAL
  Filled 2014-05-10 (×7): qty 1

## 2014-05-10 MED ORDER — FUROSEMIDE 80 MG PO TABS: 80.0000 mg | ORAL_TABLET | Freq: Every day | ORAL | Status: DC

## 2014-05-10 MED ORDER — MIRTAZAPINE 7.5 MG PO TABS
7.5000 mg | ORAL_TABLET | Freq: Every day | ORAL | Status: DC
  Administered 2014-05-10 – 2014-05-15 (×6): 7.5 mg via ORAL
  Filled 2014-05-10 (×9): qty 1

## 2014-05-10 MED ORDER — VITAMIN B-1 100 MG PO TABS
100.0000 mg | ORAL_TABLET | Freq: Every day | ORAL | Status: DC
  Administered 2014-05-10 – 2014-05-16 (×7): 100 mg via ORAL
  Filled 2014-05-10 (×7): qty 1

## 2014-05-10 MED ORDER — LORAZEPAM 0.5 MG PO TABS
0.5000 mg | ORAL_TABLET | Freq: Two times a day (BID) | ORAL | Status: DC
  Administered 2014-05-10 – 2014-05-16 (×14): 0.5 mg via ORAL
  Filled 2014-05-10 (×14): qty 1

## 2014-05-10 MED ORDER — CALCITRIOL 0.25 MCG PO CAPS
0.2500 ug | ORAL_CAPSULE | Freq: Every day | ORAL | Status: DC
  Administered 2014-05-10 – 2014-05-16 (×7): 0.25 ug via ORAL
  Filled 2014-05-10 (×7): qty 1

## 2014-05-10 MED ORDER — FOLIC ACID 1 MG PO TABS
1.0000 mg | ORAL_TABLET | Freq: Every day | ORAL | Status: DC
  Administered 2014-05-10 – 2014-05-16 (×7): 1 mg via ORAL
  Filled 2014-05-10 (×7): qty 1

## 2014-05-10 MED ORDER — HYDROMORPHONE HCL 1 MG/ML IJ SOLN
0.5000 mg | INTRAMUSCULAR | Status: DC | PRN
  Administered 2014-05-11 – 2014-05-12 (×2): 0.5 mg via INTRAVENOUS
  Filled 2014-05-10 (×2): qty 1

## 2014-05-10 MED ORDER — ONDANSETRON HCL 4 MG/2ML IJ SOLN
4.0000 mg | Freq: Four times a day (QID) | INTRAMUSCULAR | Status: DC | PRN
Start: 2014-05-10 — End: 2014-05-16
  Administered 2014-05-11 – 2014-05-13 (×2): 4 mg via INTRAVENOUS
  Filled 2014-05-10 (×2): qty 2

## 2014-05-10 MED ORDER — ONDANSETRON HCL 4 MG PO TABS
4.0000 mg | ORAL_TABLET | Freq: Four times a day (QID) | ORAL | Status: DC | PRN
  Administered 2014-05-15: 4 mg via ORAL
  Filled 2014-05-10: qty 1

## 2014-05-10 MED ORDER — DEXTROSE-NACL 5-0.9 % IV SOLN
INTRAVENOUS | Status: DC
  Administered 2014-05-10 – 2014-05-12 (×5): via INTRAVENOUS

## 2014-05-10 MED ORDER — SIMVASTATIN 20 MG PO TABS
20.0000 mg | ORAL_TABLET | Freq: Every day | ORAL | Status: DC
  Administered 2014-05-10 – 2014-05-15 (×7): 20 mg via ORAL
  Filled 2014-05-10 (×7): qty 1

## 2014-05-10 MED ORDER — MECLIZINE HCL 12.5 MG PO TABS
12.5000 mg | ORAL_TABLET | ORAL | Status: DC | PRN
  Filled 2014-05-10: qty 1

## 2014-05-10 NOTE — Progress Notes (Signed)
Patient ID: [name], female   DOB: 05-11-1925, 81 y.o.   MRN: [id number]    Subjective: Pt still with some RUQ abdominal pain today, no real change from yesterday.  Wants to eat.  Objective: Vital signs in last 24 hours: Temp:  [98.5 F (36.9 C)-100.3 F (37.9 C)] 98.5 F (36.9 C) (02/17 0603) Pulse Rate:  [68-82] 72 (02/17 0603) Resp:  [20] 20 (02/17 0603) BP: (103-134)/(42-68) 134/54 mmHg (02/17 0603) SpO2:  [93 %-99 %] 98 % (02/17 0603) Weight:  [170 lb 13.7 oz (77.5 kg)] 170 lb 13.7 oz (77.5 kg) (02/17 0124) Last BM Date: 05/09/14 (pt has a colostomy)  Intake/Output from previous day:   Intake/Output this shift:    PE: Abd: soft, tender in RUQ, +BS, ND, ostomy with no output currently, but recently emptied.  What appears to be a large peristomal hernia is present, but asymptomatic. Heart: irregular with murmur LungS: CTAB  Lab Results:   Recent Labs  05/09/14 2043 05/10/14 0437  WBC 14.8* 11.9*  HGB 10.0* 9.4*  HCT 31.4* 29.5*  PLT 140* 128*   BMET  Recent Labs  05/09/14 2043 05/10/14 0437  NA 134* 138  K 3.0* 3.2*  CL 100 105  CO2 25 25  GLUCOSE 169* 139*  BUN 39* 41*  CREATININE 2.65* 2.59*  CALCIUM 8.0* 8.0*   PT/INR No results for input(s): LABPROT, INR in the last 72 hours. CMP     Component Value Date/Time   NA 138 05/10/2014 0437   K 3.2* 05/10/2014 0437   CL 105 05/10/2014 0437   CO2 25 05/10/2014 0437   GLUCOSE 139* 05/10/2014 0437   BUN 41* 05/10/2014 0437   CREATININE 2.59* 05/10/2014 0437   CALCIUM 8.0* 05/10/2014 0437   PROT 6.1 05/10/2014 0437   ALBUMIN 2.5* 05/10/2014 0437   AST 13 05/10/2014 0437   ALT <5 05/10/2014 0437   ALKPHOS 126* 05/10/2014 0437   BILITOT 1.1 05/10/2014 0437   GFRNONAA 16* 05/10/2014 0437   GFRAA 18* 05/10/2014 0437   Lipase     Component Value Date/Time   LIPASE 32 05/09/2014 2043       Studies/Results: US Abdomen Complete  05/09/2014   CLINICAL DATA:  Right upper quadrant pain.   EXAM: ULTRASOUND ABDOMEN COMPLETE  COMPARISON:  None.  FINDINGS: Gallbladder: Gallbladder is enlarged with sludge. Gallbladder wall thickening to 5.2 mm diffuse positive Murphy sign. These findings consistent with cholecystitis.  Common bile duct: Diameter: 2.4 mm  Liver: No focal lesion identified. Within normal limits in parenchymal echogenicity.  IVC: No abnormality visualized.  Pancreas: Not visualized due to overlying bowel gas.  Spleen: Size and appearance within normal limits.  Right Kidney: Length: 9.2 cm. Diffuse renal cortical thinning. No mass or hydronephrosis.  Left Kidney:  Obscured by bowel gas.  Abdominal aorta: No aneurysm visualized.  Other findings: None.  IMPRESSION: Enlarged gallbladder with sludge. Gallbladder wall thickening and positive Murphy's sign. These findings are consistent with cholecystitis.   Electronically Signed   By: Marcello Moores  Register   On: 05/09/2014 15:32    Anti-infectives: Anti-infectives    None       Assessment/Plan  1. Acalculous cholecystitis 2. MMP 3. Extensive PSH  Plan: 1.  Due to extensive surgical abdominal history, conservative management for her cholecystitis has been initiated.  We will see if she improves with abx therapy alone, Zosyn.  If not, she may require a perc drain or a cholecystectomy.  She would be at higher risk  for open cholecystectomy given her multiple prior abdominal operations.  Cont NPO except ice and a few sips of liquids today.  2. Repeat LFTs trending down.  3. Will follow.  LOS: 1 day    Amrit Cress E 05/10/2014, 9:13 AM Pager: [phone]

## 2014-05-10 NOTE — H&P (Signed)
[facility] History and Physical  [name] PPJ:[id number] DOB: 1926/03/09 DOA: 05/09/2014  Referring physician: Dorie Rank, MD PCP: Abigail Miyamoto, MD   Chief Complaint: RUQ pain  HPI: [name] is a 81 y.o. female presents with RUQ pain. Patient states that she has been sick since about Sunday night. She states that she went to Dr Maceo Pro today to be seen. She states that she went for an ultrasound which she states was abnormal suggested inflammation of the gallbladder. Patient was then sent here to be admitted. Currently she feels good except some soreness in the RUQ. She has not had any vomiting today. She also states that she has not been eating very much. She had some low grade fever noted today. She has no headaches no dizziness noted. She states that she has had some hurting in her temple area. She denies diarrhea but has had some loose stools. She has no urinary complaints and no flank pain.   Review of Systems:  Constitutional:  No weight loss, night sweats, ++Fevers, chills, ++fatigue.  HEENT:  No headaches, Difficulty swallowing,Tooth/dental problems,Sore throat Cardio-vascular:  No chest pain, Orthopnea, PND, swelling in lower extremities, anasarca, dizziness, palpitations  GI:  No heartburn, indigestion, ++abdominal pain, ++nausea, ++vomiting, no diarrhea, ++loss of appetite  Resp:  No shortness of breath with exertion or at rest. No excess mucus, no productive cough, No non-productive cough, No coughing up of blood.No change in color of mucus Skin:  no rash or lesions.  GU:  no dysuria, change in color of urine, no urgency or frequency Musculoskeletal:  No joint pain or swelling. No decreased range of motion Psych:  No change in mood or affect. No depression or anxiety. No memory loss.   Past Medical History  Diagnosis Date  . CKD (chronic kidney disease), stage IV     a. Baseline Cr reported 2.5-2.6.  Marland Kitchen Anxiety   . Diabetes mellitus     diabetic  retinopathy  . Anemia   . Moderate aortic stenosis     a. By echo 10/2013.  Marland Kitchen Urinary tract infection   . Fall at home   . Colostomy care     colon ca - h/o bloody output from bag 12/2012 (ED visit)  . Colon cancer 30 year ago  . Chronic atrial fibrillation     a. not on anticoagulation due to increased risk of falls, advanced age.  . Chronic diastolic CHF (congestive heart failure)     a. Echo 10/2013: mild LVH, EF 60-65%, mod AS, mildly dilated LA, mod dilated RA, PASP 33.  Marland Kitchen Hypertension   . Carotid disease, bilateral     a. Carotid duplex 07/8097: 83-38% RICA/LICA.  Marland Kitchen H/O cardiovascular stress test     a. 2002: nuc negative for ischemia, EF 65%.   Past Surgical History  Procedure Laterality Date  . Abdominal surgery    . Abdominal hysterectomy    . Colon surgery    . Appendectomy    . Colostomy    . Cardioversion  09/30/2011    Procedure: CARDIOVERSION;  Surgeon: [name], MD;  Location: [place];  Service: Cardiovascular;  Laterality: N/A;  . I&d extremity  11/20/2011    Procedure: IRRIGATION AND DEBRIDEMENT EXTREMITY;  Surgeon: Alta Corning, MD;  Location: [place];  Service: Orthopedics;  Laterality: Left;  . Av fistula placement Left 06/02/2012    Procedure: ARTERIOVENOUS (AV) FISTULA CREATION;  Surgeon: Conrad Shippingport, MD;  Location: [place];  Service: Vascular;  Laterality: Left;  Bascilic Fistula  . Bascilic vein transposition Left 08/18/2012    Procedure: BASCILIC VEIN TRANSPOSITION;  Surgeon: Conrad Kincaid, MD;  Location: [place];  Service: Vascular;  Laterality: Left;  Left 2nd stage Basilic Vein Transposition    Social History:  reports that she has never smoked. Her smokeless tobacco use includes Snuff. She reports that she does not drink alcohol or use illicit drugs.  Allergies  Allergen Reactions  . Codeine Nausea Only  . Keflet [Cephalexin] Nausea Only  . Levaquin [Levofloxacin In D5w] Nausea Only  . Morphine And Related Nausea Only  . Oxycodone Other (See Comments)     Abnormal behavior    Family History  Problem Relation Age of Onset  . Heart disease Father   . Diabetes Sister   . Diabetes Daughter   . Hyperlipidemia Daughter   . Hypertension Daughter   . Other Daughter     varicose veins  . Cancer Son   . Diabetes Son   . Heart disease Son     before age 58  . Hyperlipidemia Son   . Hypertension Son      Prior to Admission medications   Medication Sig Start Date End Date Taking? Authorizing Provider  acetaminophen (TYLENOL) 500 MG tablet Take 500 mg by mouth every 6 (six) hours as needed for pain.    Yes Historical Provider, MD  aspirin EC 81 MG tablet Take 81 mg by mouth daily.   Yes Historical Provider, MD  calcitRIOL (ROCALTROL) 0.25 MCG capsule Take 0.25 mcg by mouth daily.  04/06/12  Yes Historical Provider, MD  carvedilol (COREG) 25 MG tablet TAKE 1 TABLET (25 MG TOTAL) BY MOUTH 2 (TWO) TIMES DAILY WITH A MEAL. 03/13/14  Yes [name], MD  docusate sodium (COLACE) 100 MG capsule Take 100 mg by mouth daily as needed for constipation.    Yes Historical Provider, MD  furosemide (LASIX) 80 MG tablet Take 80 mg by mouth daily. 04/10/14  Yes Historical Provider, MD  insulin glargine (LANTUS) 100 UNIT/ML injection Inject 8 Units into the skin at bedtime as needed (if cbg over 150).    Yes Historical Provider, MD  linagliptin (TRADJENTA) 5 MG TABS tablet Take 5 mg by mouth daily.    Yes Historical Provider, MD  LORazepam (ATIVAN) 0.5 MG tablet Take 0.5 mg by mouth 2 (two) times daily.   Yes Historical Provider, MD  meclizine (ANTIVERT) 12.5 MG tablet Take 12.5 mg by mouth as needed for dizziness.  11/23/12  Yes Historical Provider, MD  mirtazapine (REMERON) 7.5 MG tablet Take 7.5 mg by mouth at bedtime.  09/28/12  Yes Historical Provider, MD  promethazine (PHENERGAN) 12.5 MG tablet Take 1 tablet (12.5 mg total) by mouth every 6 (six) hours as needed for nausea or vomiting. 03/14/14  Yes Ripudeep Krystal Eaton, MD  simvastatin (ZOCOR) 20 MG tablet Take 1  tablet (20 mg total) by mouth at bedtime. 04/28/14  Yes [name], MD  vitamin B-12 (CYANOCOBALAMIN) 1000 MCG tablet Take 1,000 mcg by mouth daily.   Yes Historical Provider, MD   Physical Exam: Filed Vitals:   05/09/14 2016 05/09/14 2315  BP: 127/68 103/53  Pulse: 82 76  Temp: 100.3 F (37.9 C)   TempSrc: Oral   Resp: 20   SpO2: 95% 99%    Wt Readings from Last 3 Encounters:  04/25/14 78.654 kg (173 lb 6.4 oz)  04/07/14 75.66 kg (166 lb 12.8 oz)  03/18/14  77.474 kg (170 lb 12.8 oz)    General:  Appears calm and comfortable Eyes: PERRL, normal lids, irises & conjunctiva ENT: grossly normal hearing, lips & tongue Neck: no LAD, masses or thyromegaly Cardiovascular: RRR, no m/r/g. No LE edema. Respiratory: CTA bilaterally, no w/r/r. Normal respiratory effort. Abdomen: soft, ++murphys sign  Skin: no rash or induration seen on limited exam Musculoskeletal: grossly normal tone BUE/BLE Psychiatric: grossly normal mood and affect, speech fluent and appropriate Neurologic: grossly non-focal.          Labs on Admission:  Basic Metabolic Panel:  Recent Labs Lab 05/09/14 2043  NA 134*  K 3.0*  CL 100  CO2 25  GLUCOSE 169*  BUN 39*  CREATININE 2.65*  CALCIUM 8.0*   Liver Function Tests:  Recent Labs Lab 05/09/14 2043  AST 15  ALT 7  ALKPHOS 136*  BILITOT 1.4*  PROT 5.9*  ALBUMIN 2.9*    Recent Labs Lab 05/09/14 2043  LIPASE 32   No results for input(s): AMMONIA in the last 168 hours. CBC:  Recent Labs Lab 05/09/14 2043  WBC 14.8*  NEUTROABS 12.2*  HGB 10.0*  HCT 31.4*  MCV 89.0  PLT 140*   Cardiac Enzymes: No results for input(s): CKTOTAL, CKMB, CKMBINDEX, TROPONINI in the last 168 hours.  BNP (last 3 results)  Recent Labs  03/15/14 0914  BNP 941.4*    ProBNP (last 3 results) No results for input(s): PROBNP in the last 8760 hours.  CBG:  Recent Labs Lab 05/09/14 2035  GLUCAP 164*    Radiological Exams on Admission: US  Abdomen Complete  05/09/2014   CLINICAL DATA:  Right upper quadrant pain.  EXAM: ULTRASOUND ABDOMEN COMPLETE  COMPARISON:  None.  FINDINGS: Gallbladder: Gallbladder is enlarged with sludge. Gallbladder wall thickening to 5.2 mm diffuse positive Murphy sign. These findings consistent with cholecystitis.  Common bile duct: Diameter: 2.4 mm  Liver: No focal lesion identified. Within normal limits in parenchymal echogenicity.  IVC: No abnormality visualized.  Pancreas: Not visualized due to overlying bowel gas.  Spleen: Size and appearance within normal limits.  Right Kidney: Length: 9.2 cm. Diffuse renal cortical thinning. No mass or hydronephrosis.  Left Kidney:  Obscured by bowel gas.  Abdominal aorta: No aneurysm visualized.  Other findings: None.  IMPRESSION: Enlarged gallbladder with sludge. Gallbladder wall thickening and positive Murphy's sign. These findings are consistent with cholecystitis.   Electronically Signed   By: Marcello Moores  Register   On: 05/09/2014 15:32     Assessment/Plan Active Problems:   Diabetes mellitus, type II   Hypertension   CKD (chronic kidney disease), stage IV   Acute cholecystitis   1. Acute Cholecystitis -surgical consult done deemed not a surgical candidate at this time -will start on antibiotics IV zosyn -will continue with IV fluids -keep NPO for now -monitor LFTs  2. Diabetes Mellitus type II -will check A1C -Monitor FSBS -Insulin coverage as needed  3. CKD Stage IV -creatinine is around baseline -will monitor labs  4. HTN -will continue with home medications    Code Status: Full Code (must indicate code status--if unknown or must be presumed, indicate so) DVT Prophylaxis:Heparin Family Communication: sister (indicate person spoken with, if applicable, with phone number if by telephone) Disposition Plan: Home (indicate anticipated LOS)  Time spent: 30min  Meriem Lemieux A [facility] Pager [phone]

## 2014-05-10 NOTE — Care Management (Signed)
IM Medicare given. Magdalen Spatz RN BSN

## 2014-05-10 NOTE — Progress Notes (Signed)
[facility] Progress Note   [name] TMH:[id number] DOB: 1925/12/15 DOA: 05/09/2014 PCP: Abigail Miyamoto, MD  Brief narrative: [name] is a 81 y.o. female presenting with RUQ pain and found to have acute cholecystitis. Surgery feels she is a poor candidate for surgery as she may need and open cholecystectomy in light of multiple prior surgeries.    Subjective: Feels tender in RUQ but this is slightly better than yesterday. No nausea or vomiting.   Assessment/Plan: Principal Problem:   Acute cholecystitis - management per surgery- we will manage medical issues  Active Problems:   Diabetes mellitus, type II - lantus on hold- cont sliding scale and Tradjenta    Hypertension - cont Coreg    CKD (chronic kidney disease), stage IV - noted to be on Lasix- will d/c it for now as she is NPO and can easily become dehydrated- she is only on 50 cc of IVF per hr- follow daily Cr and fluid balance  Hypokalemia - replace and recheck    Code Status: Full code Family Communication:  Disposition Plan: per surgery DVT prophylaxis: Heparin  Consultants: surgery  Procedures:   Antibiotics: Anti-infectives    None         Objective: Filed Weights   05/10/14 0124  Weight: 77.5 kg (170 lb 13.7 oz)   No intake or output data in the 24 hours ending 05/10/14 0904   Vitals Filed Vitals:   05/10/14 0015 05/10/14 0030 05/10/14 0124 05/10/14 0603  BP: 108/47 109/42 114/56 134/54  Pulse: 71 70 79 72  Temp:   98.5 F (36.9 C) 98.5 F (36.9 C)  TempSrc:   Oral Oral  Resp:    20  Height:   5\' 3"  (1.6 m)   Weight:   77.5 kg (170 lb 13.7 oz)   SpO2: 93% 96% 98% 98%    Exam: General: AAO x3, No acute respiratory distress Lungs: Clear to auscultation bilaterally without wheezes or crackles Cardiovascular: Regular rate and rhythm without murmur gallop or rub normal S1 and S2 Abdomen: Tender in RUQ, nondistended, soft, bowel sounds positive, no rebound, no  ascites, no appreciable mass Extremities: No significant cyanosis, clubbing, or edema bilateral lower extremities  Data Reviewed: Basic Metabolic Panel:  Recent Labs Lab 05/09/14 2043 05/10/14 0437  NA 134* 138  K 3.0* 3.2*  CL 100 105  CO2 25 25  GLUCOSE 169* 139*  BUN 39* 41*  CREATININE 2.65* 2.59*  CALCIUM 8.0* 8.0*   Liver Function Tests:  Recent Labs Lab 05/09/14 2043 05/10/14 0437  AST 15 13  ALT 7 <5  ALKPHOS 136* 126*  BILITOT 1.4* 1.1  PROT 5.9* 6.1  ALBUMIN 2.9* 2.5*    Recent Labs Lab 05/09/14 2043  LIPASE 32   No results for input(s): AMMONIA in the last 168 hours. CBC:  Recent Labs Lab 05/09/14 2043 05/10/14 0437  WBC 14.8* 11.9*  NEUTROABS 12.2*  --   HGB 10.0* 9.4*  HCT 31.4* 29.5*  MCV 89.0 89.9  PLT 140* 128*   Cardiac Enzymes: No results for input(s): CKTOTAL, CKMB, CKMBINDEX, TROPONINI in the last 168 hours. BNP (last 3 results)  Recent Labs  03/15/14 0914  BNP 941.4*    ProBNP (last 3 results) No results for input(s): PROBNP in the last 8760 hours.  CBG:  Recent Labs Lab 05/09/14 2035 05/10/14 0133 05/10/14 0755  GLUCAP 164* 107* 152*    No results found for this or any previous visit (from the past  240 hour(s)).   Studies:  Recent x-ray studies have been reviewed in detail by the Attending Physician  Scheduled Meds:  Scheduled Meds: . calcitRIOL  0.25 mcg Oral Daily  . carvedilol  25 mg Oral BID WC  . folic acid  1 mg Oral Daily  . furosemide  80 mg Oral Daily  . heparin  5,000 Units Subcutaneous 3 times per day  . insulin aspart  0-15 Units Subcutaneous TID WC  . linagliptin  5 mg Oral Daily  . LORazepam  0.5 mg Oral BID  . mirtazapine  7.5 mg Oral QHS  . multivitamin with minerals  1 tablet Oral Daily  . simvastatin  20 mg Oral QHS  . thiamine  100 mg Oral Daily   Continuous Infusions: . dextrose 5 % and 0.9% NaCl 50 mL/hr at 05/10/14 0147    Time spent on care of this patient: 57  min   [place], MD 05/10/2014, 9:04 AM  LOS: 1 day   [facility] Office  [phone] Pager - Text Page per [email or web address]  If 7PM-7AM, please contact night-coverage [email or web address]

## 2014-05-10 NOTE — Progress Notes (Signed)
Pt admitted from ED with c/o of abdominal pain, denies any pain at this time,just soreness, was however reassured,pt settled in room with family at bedside, v/s stable and call light within pt's reach, will continue to monitor. [place]-Asidi, Ashlynn Gunnels Efe

## 2014-05-11 ENCOUNTER — Inpatient Hospital Stay (HOSPITAL_COMMUNITY): Payer: Medicare Other

## 2014-05-11 DIAGNOSIS — K819 Cholecystitis, unspecified: Secondary | ICD-10-CM

## 2014-05-11 HISTORY — PX: CT PERC CHOLECYSTOSTOMY: HXRAD817

## 2014-05-11 LAB — PROTIME-INR
INR: 1.47 (ref 0.00–1.49)
PROTHROMBIN TIME: 18 s — AB (ref 11.6–15.2)

## 2014-05-11 LAB — CBC
HEMATOCRIT: 28.6 % — AB (ref 36.0–46.0)
Hemoglobin: 8.9 g/dL — ABNORMAL LOW (ref 12.0–15.0)
MCH: 28.8 pg (ref 26.0–34.0)
MCHC: 31.1 g/dL (ref 30.0–36.0)
MCV: 92.6 fL (ref 78.0–100.0)
Platelets: 125 10*3/uL — ABNORMAL LOW (ref 150–400)
RBC: 3.09 MIL/uL — ABNORMAL LOW (ref 3.87–5.11)
RDW: 15.1 % (ref 11.5–15.5)
WBC: 8.2 10*3/uL (ref 4.0–10.5)

## 2014-05-11 LAB — GLUCOSE, CAPILLARY
Glucose-Capillary: 138 mg/dL — ABNORMAL HIGH (ref 70–99)
Glucose-Capillary: 104 mg/dL — ABNORMAL HIGH (ref 70–99)
Glucose-Capillary: 118 mg/dL — ABNORMAL HIGH (ref 70–99)
Glucose-Capillary: 107 mg/dL — ABNORMAL HIGH (ref 70–99)

## 2014-05-11 LAB — BASIC METABOLIC PANEL
Anion gap: 6 (ref 5–15)
BUN: 38 mg/dL — AB (ref 6–23)
CHLORIDE: 110 mmol/L (ref 96–112)
CO2: 24 mmol/L (ref 19–32)
CREATININE: 2.65 mg/dL — AB (ref 0.50–1.10)
Calcium: 8 mg/dL — ABNORMAL LOW (ref 8.4–10.5)
GFR calc Af Amer: 18 mL/min — ABNORMAL LOW (ref >90)
GFR calc non Af Amer: 15 mL/min — ABNORMAL LOW (ref >90)
GLUCOSE: 125 mg/dL — AB (ref 70–99)
Potassium: 3.3 mmol/L — ABNORMAL LOW (ref 3.5–5.1)
Sodium: 140 mmol/L (ref 135–145)

## 2014-05-11 LAB — HEMOGLOBIN A1C
Hgb A1c MFr Bld: 6.8 % — ABNORMAL HIGH (ref 4.8–5.6)
MEAN PLASMA GLUCOSE: 148 mg/dL

## 2014-05-11 LAB — APTT: aPTT: 44 seconds — ABNORMAL HIGH (ref 24–37)

## 2014-05-11 MED ORDER — FENTANYL CITRATE 0.05 MG/ML IJ SOLN
INTRAMUSCULAR | Status: AC
  Filled 2014-05-11: qty 4

## 2014-05-11 MED ORDER — FENTANYL CITRATE 0.05 MG/ML IJ SOLN
INTRAMUSCULAR | Status: AC | PRN
  Administered 2014-05-11 (×2): 25 ug via INTRAVENOUS

## 2014-05-11 MED ORDER — HEPARIN SODIUM (PORCINE) 5000 UNIT/ML IJ SOLN
5000.0000 [IU] | Freq: Three times a day (TID) | INTRAMUSCULAR | Status: DC
  Administered 2014-05-11 – 2014-05-16 (×14): 5000 [IU] via SUBCUTANEOUS
  Filled 2014-05-11 (×14): qty 1

## 2014-05-11 MED ORDER — LIDOCAINE HCL 1 % IJ SOLN
INTRAMUSCULAR | Status: AC
  Filled 2014-05-11: qty 20

## 2014-05-11 MED ORDER — MIDAZOLAM HCL 2 MG/2ML IJ SOLN
INTRAMUSCULAR | Status: AC | PRN
  Administered 2014-05-11: 0.5 mg via INTRAVENOUS

## 2014-05-11 MED ORDER — PIPERACILLIN-TAZOBACTAM IN DEX 2-0.25 GM/50ML IV SOLN
2.2500 g | Freq: Four times a day (QID) | INTRAVENOUS | Status: DC
  Administered 2014-05-11 – 2014-05-14 (×13): 2.25 g via INTRAVENOUS
  Filled 2014-05-11 (×18): qty 50

## 2014-05-11 MED ORDER — MIDAZOLAM HCL 2 MG/2ML IJ SOLN
INTRAMUSCULAR | Status: AC
  Filled 2014-05-11: qty 4

## 2014-05-11 NOTE — Consult Note (Signed)
Chief Complaint: Chief Complaint  Patient presents with  . Emesis  RUQ pain cholecystitis  Referring Physician(s): [facility]  History of Present Illness: [name] is a 81 y.o. female   Pt has had intermitent RUQ pain for several weeks Worsening recently and admitted through ED [place] Abd 2/16 reveals cholecystitis Continues to have RUQ pain WBC trending down - on antibiotics Temp normalizing Has been seen by [facility] for possible surgical procedure, but they feel she is not good surgical candidate secondary age; multiple previous surgeries (dx rectal ca 1980; colon surgery with peristomal hernia repair in 1992.  2nd colon ca 2002 and had Rt hemicolectomy); pt also has extensive intraabdominal adhesions Request for percutaneous cholecystostomy drain placement per [facility]CS Dr Earleen Newport has reviewed imaging and approves procedure I have seen and examined pt Now scheduled for same   Past Medical History  Diagnosis Date  . CKD (chronic kidney disease), stage IV     a. Baseline Cr reported 2.5-2.6.  Marland Kitchen Anxiety   . Diabetes mellitus     diabetic retinopathy  . Anemia   . Moderate aortic stenosis     a. By echo 10/2013.  Marland Kitchen Urinary tract infection   . Fall at home   . Colostomy care     colon ca - h/o bloody output from bag 12/2012 (ED visit)  . Colon cancer 30 year ago  . Chronic atrial fibrillation     a. not on anticoagulation due to increased risk of falls, advanced age.  . Chronic diastolic CHF (congestive heart failure)     a. Echo 10/2013: mild LVH, EF 60-65%, mod AS, mildly dilated LA, mod dilated RA, PASP 33.  Marland Kitchen Hypertension   . Carotid disease, bilateral     a. Carotid duplex 09/8586: 50-27% RICA/LICA.  Marland Kitchen H/O cardiovascular stress test     a. 2002: nuc negative for ischemia, EF 65%.    Past Surgical History  Procedure Laterality Date  . Abdominal surgery    . Abdominal hysterectomy    . Colon surgery    . Appendectomy    . Colostomy    . Cardioversion  09/30/2011   Procedure: CARDIOVERSION;  Surgeon: [name], MD;  Location: [place];  Service: Cardiovascular;  Laterality: N/A;  . I&d extremity  11/20/2011    Procedure: IRRIGATION AND DEBRIDEMENT EXTREMITY;  Surgeon: Alta Corning, MD;  Location: [place];  Service: Orthopedics;  Laterality: Left;  . Av fistula placement Left 06/02/2012    Procedure: ARTERIOVENOUS (AV) FISTULA CREATION;  Surgeon: Conrad Laurel, MD;  Location: [place];  Service: Vascular;  Laterality: Left;  Bascilic Fistula  . Bascilic vein transposition Left 08/18/2012    Procedure: BASCILIC VEIN TRANSPOSITION;  Surgeon: Conrad Fife, MD;  Location: [place];  Service: Vascular;  Laterality: Left;  Left 2nd stage Basilic Vein Transposition     Allergies: Codeine; Keflet; Levaquin; Morphine and related; and Oxycodone  Medications: Prior to Admission medications   Medication Sig Start Date End Date Taking? Authorizing Provider  acetaminophen (TYLENOL) 500 MG tablet Take 500 mg by mouth every 6 (six) hours as needed for pain.    Yes Historical Provider, MD  aspirin EC 81 MG tablet Take 81 mg by mouth daily.   Yes Historical Provider, MD  calcitRIOL (ROCALTROL) 0.25 MCG capsule Take 0.25 mcg by mouth daily.  04/06/12  Yes Historical Provider, MD  carvedilol (COREG) 25 MG tablet TAKE 1 TABLET (25 MG TOTAL) BY MOUTH 2 (TWO) TIMES  DAILY WITH A MEAL. 03/13/14  Yes [name], MD  docusate sodium (COLACE) 100 MG capsule Take 100 mg by mouth daily as needed for constipation.    Yes Historical Provider, MD  furosemide (LASIX) 80 MG tablet Take 80 mg by mouth daily. 04/10/14  Yes Historical Provider, MD  insulin glargine (LANTUS) 100 UNIT/ML injection Inject 8 Units into the skin at bedtime as needed (if cbg over 150).    Yes Historical Provider, MD  linagliptin (TRADJENTA) 5 MG TABS tablet Take 5 mg by mouth daily.    Yes Historical Provider, MD  LORazepam (ATIVAN) 0.5 MG tablet Take 0.5 mg by mouth 2 (two) times daily.   Yes Historical Provider, MD    meclizine (ANTIVERT) 12.5 MG tablet Take 12.5 mg by mouth as needed for dizziness.  11/23/12  Yes Historical Provider, MD  mirtazapine (REMERON) 7.5 MG tablet Take 7.5 mg by mouth at bedtime.  09/28/12  Yes Historical Provider, MD  promethazine (PHENERGAN) 12.5 MG tablet Take 1 tablet (12.5 mg total) by mouth every 6 (six) hours as needed for nausea or vomiting. 03/14/14  Yes Ripudeep Krystal Eaton, MD  simvastatin (ZOCOR) 20 MG tablet Take 1 tablet (20 mg total) by mouth at bedtime. 04/28/14  Yes [name], MD  vitamin B-12 (CYANOCOBALAMIN) 1000 MCG tablet Take 1,000 mcg by mouth daily.   Yes Historical Provider, MD     Family History  Problem Relation Age of Onset  . Heart disease Father   . Diabetes Sister   . Diabetes Daughter   . Hyperlipidemia Daughter   . Hypertension Daughter   . Other Daughter     varicose veins  . Cancer Son   . Diabetes Son   . Heart disease Son     before age 55  . Hyperlipidemia Son   . Hypertension Son     History   Social History  . Marital Status: Widowed    Spouse Name: N/A  . Number of Children: N/A  . Years of Education: N/A   Social History Main Topics  . Smoking status: Never Smoker   . Smokeless tobacco: Current User    Types: Snuff  . Alcohol Use: No  . Drug Use: No  . Sexual Activity: No   Other Topics Concern  . None   Social History Narrative    Review of Systems: A 12 point ROS discussed and pertinent positives are indicated in the HPI above.  All other systems are negative.  Review of Systems  Constitutional: Positive for activity change, appetite change and fatigue.  Respiratory: Negative for cough and shortness of breath.   Cardiovascular: Negative for chest pain.  Gastrointestinal: Positive for nausea, vomiting, abdominal pain and abdominal distention.  Genitourinary: Negative for difficulty urinating.  Musculoskeletal: Negative for back pain.  Neurological: Positive for dizziness and weakness. Negative for headaches.   Psychiatric/Behavioral: Negative for behavioral problems and confusion.    Vital Signs: BP 119/46 mmHg  Pulse 75  Temp(Src) 98.9 F (37.2 C) (Oral)  Resp 18  Ht 5\' 3"  (1.6 m)  Wt 82 kg (180 lb 12.4 oz)  BMI 32.03 kg/m2  SpO2 97%  Physical Exam  Constitutional: She is oriented to person, place, and time.  Cardiovascular: Normal rate, regular rhythm and normal heart sounds.   No murmur heard. Pulmonary/Chest: Effort normal and breath sounds normal. She has no wheezes.  Abdominal: Soft. Bowel sounds are normal. She exhibits distension. There is tenderness.  L colectomy/colostomy bag  Musculoskeletal:  Normal range of motion.  Neurological: She is alert and oriented to person, place, and time.  Skin: Skin is warm and dry.  Psychiatric: She has a normal mood and affect. Her behavior is normal. Judgment and thought content normal.  Nursing note and vitals reviewed.   Mallampati Score:  MD Evaluation Airway: WNL Heart: WNL Abdomen: WNL Chest/ Lungs: WNL ASA  Classification: 3 Mallampati/Airway Score: Two  Imaging: US Abdomen Complete  05/09/2014   CLINICAL DATA:  Right upper quadrant pain.  EXAM: ULTRASOUND ABDOMEN COMPLETE  COMPARISON:  None.  FINDINGS: Gallbladder: Gallbladder is enlarged with sludge. Gallbladder wall thickening to 5.2 mm diffuse positive Murphy sign. These findings consistent with cholecystitis.  Common bile duct: Diameter: 2.4 mm  Liver: No focal lesion identified. Within normal limits in parenchymal echogenicity.  IVC: No abnormality visualized.  Pancreas: Not visualized due to overlying bowel gas.  Spleen: Size and appearance within normal limits.  Right Kidney: Length: 9.2 cm. Diffuse renal cortical thinning. No mass or hydronephrosis.  Left Kidney:  Obscured by bowel gas.  Abdominal aorta: No aneurysm visualized.  Other findings: None.  IMPRESSION: Enlarged gallbladder with sludge. Gallbladder wall thickening and positive Murphy's sign. These findings are  consistent with cholecystitis.   Electronically Signed   By: Marcello Moores  Register   On: 05/09/2014 15:32    Labs:  CBC:  Recent Labs  03/16/14 0405 05/09/14 2043 05/10/14 0437 05/11/14 0614  WBC 9.2 14.8* 11.9* 8.2  HGB 10.6* 10.0* 9.4* 8.9*  HCT 36.0 31.4* 29.5* 28.6*  PLT 203 140* 128* 125*    COAGS:  Recent Labs  03/15/14 0914  INR 1.20    BMP:  Recent Labs  03/18/14 0329 05/09/14 2043 05/10/14 0437 05/11/14 0614  NA 141 134* 138 140  K 4.5 3.0* 3.2* 3.3*  CL 103 100 105 110  CO2 29 25 25 24   GLUCOSE 87 169* 139* 125*  BUN 30* 39* 41* 38*  CALCIUM 9.1 8.0* 8.0* 8.0*  CREATININE 2.34* 2.65* 2.59* 2.65*  GFRNONAA 18* 15* 16* 15*  GFRAA 21* 18* 18* 18*    LIVER FUNCTION TESTS:  Recent Labs  03/13/14 0407 03/15/14 0914 05/09/14 2043 05/10/14 0437  BILITOT 0.4 0.3 1.4* 1.1  AST 11 15 15 13   ALT 5 7 7  <5  ALKPHOS 161* 160* 136* 126*  PROT 5.6* 6.2 5.9* 6.1  ALBUMIN 2.5* 3.1* 2.9* 2.5*    TUMOR MARKERS: No results for input(s): AFPTM, CEA, CA199, CHROMGRNA in the last 8760 hours.  Assessment and Plan:  Cholecystitis Worsening RUQ pain Previous multiple abd surgeries with intra abdominal adhesions [facility] requesting IR Percutaneous cholecystostomy drain placement Pt and dtr aware of procedure benefits and risks including but not limited to: Infection; bleeding; organ damage; dmagae to surrounding structures Agreeable to proceed Consent signed andin chart Labs pending Pt did receive Hep inj this am---held now Coordinate Zosyn dose with procedure   Thank you for this interesting consult.  I greatly enjoyed meeting [name] and look forward to participating in their care.  Signed: Tifanie Gardiner A 05/11/2014, 10:51 AM   I spent a total of 40 Minutes  in face to face in clinical consultation, greater than 50% of which was counseling/coordinating care for perc chole drain

## 2014-05-11 NOTE — Sedation Documentation (Signed)
Successful choley drain placed

## 2014-05-11 NOTE — Procedures (Signed)
Interventional Radiology Procedure Note  Procedure: US guided / fluoro guided percutaneous cholecystostomy tube. 78F drain placed.  Complications: No immediate Recommendations:  - Ok to shower tomorrow -  Culture sent - Routine care, with gravity drainage - may follow up in 6-8 weeks for drain check for biliary patency. - surgery will decide if operative candidate for interval cholecystectomy   Signed,  Dulcy Fanny. Earleen Newport, DO

## 2014-05-11 NOTE — Progress Notes (Signed)
PROGRESS NOTE  [name]:[id number] DOB: 05-18-1925 DOA: 05/09/2014 PCP: Abigail Miyamoto, MD  Assessment/Plan: Acalculous cholecystitis - appreciate surgery followup -2/18--plans noted for perc cholecystotomy tube -start zosyn Chronic atrial fibrillation -Rate controlled -Continue carvedilol -not AC candidate due to falls -CHADS-VASc= 5 Chronic diastolic CHF -Compensated at this time -Hold furosemide  Diabetes mellitus, type II - lantus on hold -cont sliding scale and Tradjenta -05/10/2014 hemoglobin A1c 6.8 Hypertension -Controlled - cont Coreg  CKD (chronic kidney disease), stage IV - Baseline creatinine 2.3-2.6 -Hold furosemide -Judicious IV fluids  Hypokalemia - replace and recheck -Check magnesium History of colon cancer 2002 -Status post right hemicolectomy   Family Communication:   Family updated at beside Disposition Plan:   Home when medically stable       Procedures/Studies: US Abdomen Complete  05/09/2014   CLINICAL DATA:  Right upper quadrant pain.  EXAM: ULTRASOUND ABDOMEN COMPLETE  COMPARISON:  None.  FINDINGS: Gallbladder: Gallbladder is enlarged with sludge. Gallbladder wall thickening to 5.2 mm diffuse positive Murphy sign. These findings consistent with cholecystitis.  Common bile duct: Diameter: 2.4 mm  Liver: No focal lesion identified. Within normal limits in parenchymal echogenicity.  IVC: No abnormality visualized.  Pancreas: Not visualized due to overlying bowel gas.  Spleen: Size and appearance within normal limits.  Right Kidney: Length: 9.2 cm. Diffuse renal cortical thinning. No mass or hydronephrosis.  Left Kidney:  Obscured by bowel gas.  Abdominal aorta: No aneurysm visualized.  Other findings: None.  IMPRESSION: Enlarged gallbladder with sludge. Gallbladder wall thickening and positive Murphy's sign. These findings are consistent with cholecystitis.   Electronically Signed   By: Marcello Moores  Register   On: 05/09/2014  15:32         Subjective: Patient complains of right upper quadrant and epigastric pain. Denies any chest pain, shortness breath, coughing, hemoptysis. She has some nausea without emesis. She denies any fevers, chills, headache.  Objective: Filed Vitals:   05/10/14 0603 05/10/14 1301 05/10/14 2150 05/11/14 0557  BP: 134/54 119/46 112/50 119/46  Pulse: 72 71 71 75  Temp: 98.5 F (36.9 C) 98.9 F (37.2 C) 98.7 F (37.1 C) 98.9 F (37.2 C)  TempSrc: Oral Oral Oral Oral  Resp: 20 17 16 18   Height:      Weight:    82 kg (180 lb 12.4 oz)  SpO2: 98% 99% 95% 97%    Intake/Output Summary (Last 24 hours) at 05/11/14 1319 Last data filed at 05/10/14 2150  Gross per 24 hour  Intake  702.5 ml  Output    200 ml  Net  502.5 ml   Weight change: 4.5 kg (9 lb 14.7 oz) Exam:   General:  Pt is alert, follows commands appropriately, not in acute distress  HEENT: No icterus, No thrush, No neck mass, [place]/AT  Cardiovascular: RRR, S1/S2, no rubs, no gallops  Respiratory: CTA bilaterally, no wheezing, no crackles, no rhonchi  Abdomen: Soft/+BS, RUQ and epigastric pain without peritoneal sign, non distended, no guarding  Extremities: 1+LE edema, No lymphangitis, No petechiae, No rashes, no synovitis  Data Reviewed: Basic Metabolic Panel:  Recent Labs Lab 05/09/14 2043 05/10/14 0437 05/11/14 0614  NA 134* 138 140  K 3.0* 3.2* 3.3*  CL 100 105 110  CO2 25 25 24   GLUCOSE 169* 139* 125*  BUN 39* 41* 38*  CREATININE 2.65* 2.59* 2.65*  CALCIUM 8.0* 8.0* 8.0*   Liver Function Tests:  Recent Labs Lab  05/09/14 2043 05/10/14 0437  AST 15 13  ALT 7 <5  ALKPHOS 136* 126*  BILITOT 1.4* 1.1  PROT 5.9* 6.1  ALBUMIN 2.9* 2.5*    Recent Labs Lab 05/09/14 2043  LIPASE 32   No results for input(s): AMMONIA in the last 168 hours. CBC:  Recent Labs Lab 05/09/14 2043 05/10/14 0437 05/11/14 0614  WBC 14.8* 11.9* 8.2  NEUTROABS 12.2*  --   --   HGB 10.0* 9.4* 8.9*  HCT  31.4* 29.5* 28.6*  MCV 89.0 89.9 92.6  PLT 140* 128* 125*   Cardiac Enzymes: No results for input(s): CKTOTAL, CKMB, CKMBINDEX, TROPONINI in the last 168 hours. BNP: Invalid input(s): POCBNP CBG:  Recent Labs Lab 05/10/14 1153 05/10/14 1723 05/10/14 2149 05/11/14 0746 05/11/14 1157  GLUCAP 91 136* 99 138* 104*    No results found for this or any previous visit (from the past 240 hour(s)).   Scheduled Meds: . calcitRIOL  0.25 mcg Oral Daily  . carvedilol  25 mg Oral BID WC  . folic acid  1 mg Oral Daily  . heparin  5,000 Units Subcutaneous 3 times per day  . insulin aspart  0-15 Units Subcutaneous TID WC  . linagliptin  5 mg Oral Daily  . LORazepam  0.5 mg Oral BID  . mirtazapine  7.5 mg Oral QHS  . multivitamin with minerals  1 tablet Oral Daily  . piperacillin-tazobactam (ZOSYN)  IV  2.25 g Intravenous 4 times per day  . simvastatin  20 mg Oral QHS  . thiamine  100 mg Oral Daily   Continuous Infusions: . dextrose 5 % and 0.9% NaCl 50 mL/hr at 05/11/14 0427     Olivianna Higley, DO  [facility] Pager [phone]  If 7PM-7AM, please contact night-coverage [email or web address] Password [email or web address] 05/11/2014, 1:19 PM   LOS: 2 days

## 2014-05-11 NOTE — Progress Notes (Signed)
Patient ID: [name], female   DOB: Sep 07, 1925, 81 y.o.   MRN: [id number]     [facility]      [address] [place], [place] [address]    Phone: [phone] FAX: [phone]     Subjective: Continues to have pain, no improvement.  Denies n/v.  Pt states, "I want something done and go home."  She is afebrile.  WBC has normalized.   Objective:  Vital signs:  Filed Vitals:   05/10/14 0603 05/10/14 1301 05/10/14 2150 05/11/14 0557  BP: 134/54 119/46 112/50 119/46  Pulse: 72 71 71 75  Temp: 98.5 F (36.9 C) 98.9 F (37.2 C) 98.7 F (37.1 C) 98.9 F (37.2 C)  TempSrc: Oral Oral Oral Oral  Resp: $Remo'20 17 16 18  'rQzBn$ Height:      Weight:    180 lb 12.4 oz (82 kg)  SpO2: 98% 99% 95% 97%    Last BM Date: 05/10/14 (per colostomy, per pt)  Intake/Output   Yesterday:  02/17 0701 - 02/18 0700 In: 702.5 [P.O.:60; I.V.:642.5] Out: 200 [Urine:200] This shift: I/O last 3 completed shifts: In: 702.5 [P.O.:60; I.V.:642.5] Out: 200 [Urine:200]    Physical Exam: General: Pt awake/alert/oriented x4 in no acute distress Abdomen: Soft.  Nondistended. Moderate TTP to RUQ and epigastric region, voluntary guarding.  No evidence of peritonitis.  No incarcerated hernias.    Problem List:   Principal Problem:   Acute cholecystitis Active Problems:   Diabetes mellitus, type II   Hypertension   CKD (chronic kidney disease), stage IV   Chronic kidney disease   Type 2 diabetes mellitus with diabetic chronic kidney disease    Results:   Labs: Results for orders placed or performed during the hospital encounter of 05/09/14 (from the past 55 hour(s))  CBG monitoring, ED     Status: Abnormal   Collection Time: 05/09/14  8:35 PM  Result Value Ref Range   Glucose-Capillary 164 (H) 70 - 99 mg/dL  CBC with Differential     Status: Abnormal   Collection Time: 05/09/14  8:43 PM  Result Value Ref Range   WBC 14.8 (H) 4.0 - 10.5 K/uL   RBC  3.53 (L) 3.87 - 5.11 MIL/uL   Hemoglobin 10.0 (L) 12.0 - 15.0 g/dL   HCT 31.4 (L) 36.0 - 46.0 %   MCV 89.0 78.0 - 100.0 fL   MCH 28.3 26.0 - 34.0 pg   MCHC 31.8 30.0 - 36.0 g/dL   RDW 14.8 11.5 - 15.5 %   Platelets 140 (L) 150 - 400 K/uL   Neutrophils Relative % 83 (H) 43 - 77 %   Neutro Abs 12.2 (H) 1.7 - 7.7 K/uL   Lymphocytes Relative 10 (L) 12 - 46 %   Lymphs Abs 1.5 0.7 - 4.0 K/uL   Monocytes Relative 7 3 - 12 %   Monocytes Absolute 1.0 0.1 - 1.0 K/uL   Eosinophils Relative 0 0 - 5 %   Eosinophils Absolute 0.0 0.0 - 0.7 K/uL   Basophils Relative 0 0 - 1 %   Basophils Absolute 0.0 0.0 - 0.1 K/uL  Comprehensive metabolic panel     Status: Abnormal   Collection Time: 05/09/14  8:43 PM  Result Value Ref Range   Sodium 134 (L) 135 - 145 mmol/L   Potassium 3.0 (L) 3.5 - 5.1 mmol/L   Chloride 100 96 - 112 mmol/L   CO2 25 19 - 32 mmol/L   Glucose,  Bld 169 (H) 70 - 99 mg/dL   BUN 39 (H) 6 - 23 mg/dL   Creatinine, Ser 2.65 (H) 0.50 - 1.10 mg/dL   Calcium 8.0 (L) 8.4 - 10.5 mg/dL   Total Protein 5.9 (L) 6.0 - 8.3 g/dL   Albumin 2.9 (L) 3.5 - 5.2 g/dL   AST 15 0 - 37 U/L   ALT 7 0 - 35 U/L   Alkaline Phosphatase 136 (H) 39 - 117 U/L   Total Bilirubin 1.4 (H) 0.3 - 1.2 mg/dL   GFR calc non Af Amer 15 (L) >90 mL/min   GFR calc Af Amer 18 (L) >90 mL/min    Comment: (NOTE) The eGFR has been calculated using the CKD EPI equation. This calculation has not been validated in all clinical situations. eGFR's persistently <90 mL/min signify possible Chronic Kidney Disease.    Anion gap 9 5 - 15  Lipase, blood     Status: None   Collection Time: 05/09/14  8:43 PM  Result Value Ref Range   Lipase 32 11 - 59 U/L  Urinalysis, Routine w reflex microscopic     Status: Abnormal   Collection Time: 05/09/14  8:49 PM  Result Value Ref Range   Color, Urine YELLOW YELLOW   APPearance CLEAR CLEAR   Specific Gravity, Urine 1.013 1.005 - 1.030   pH 5.0 5.0 - 8.0   Glucose, UA NEGATIVE NEGATIVE  mg/dL   Hgb urine dipstick TRACE (A) NEGATIVE   Bilirubin Urine NEGATIVE NEGATIVE   Ketones, ur NEGATIVE NEGATIVE mg/dL   Protein, ur NEGATIVE NEGATIVE mg/dL   Urobilinogen, UA 1.0 0.0 - 1.0 mg/dL   Nitrite NEGATIVE NEGATIVE   Leukocytes, UA NEGATIVE NEGATIVE  Urine microscopic-add on     Status: Abnormal   Collection Time: 05/09/14  8:49 PM  Result Value Ref Range   Squamous Epithelial / LPF RARE RARE   WBC, UA 0-2 <3 WBC/hpf   RBC / HPF 3-6 <3 RBC/hpf   Bacteria, UA FEW (A) RARE   Urine-Other AMORPHOUS URATES/PHOSPHATES   Glucose, capillary     Status: Abnormal   Collection Time: 05/10/14  1:33 AM  Result Value Ref Range   Glucose-Capillary 107 (H) 70 - 99 mg/dL  Hemoglobin A1c     Status: Abnormal   Collection Time: 05/10/14  4:37 AM  Result Value Ref Range   Hgb A1c MFr Bld 6.8 (H) 4.8 - 5.6 %    Comment: (NOTE)         Pre-diabetes: 5.7 - 6.4         Diabetes: >6.4         Glycemic control for adults with diabetes: <7.0    Mean Plasma Glucose 148 mg/dL    Comment: (NOTE) Performed At: [facility] [place], [place] [id number] Lindon Romp MD EV:[id number]   Comprehensive metabolic panel     Status: Abnormal   Collection Time: 05/10/14  4:37 AM  Result Value Ref Range   Sodium 138 135 - 145 mmol/L   Potassium 3.2 (L) 3.5 - 5.1 mmol/L   Chloride 105 96 - 112 mmol/L   CO2 25 19 - 32 mmol/L   Glucose, Bld 139 (H) 70 - 99 mg/dL   BUN 41 (H) 6 - 23 mg/dL   Creatinine, Ser 2.59 (H) 0.50 - 1.10 mg/dL   Calcium 8.0 (L) 8.4 - 10.5 mg/dL   Total Protein 6.1 6.0 - 8.3 g/dL   Albumin 2.5 (L) 3.5 - 5.2 g/dL  AST 13 0 - 37 U/L   ALT <5 0 - 35 U/L   Alkaline Phosphatase 126 (H) 39 - 117 U/L   Total Bilirubin 1.1 0.3 - 1.2 mg/dL   GFR calc non Af Amer 16 (L) >90 mL/min   GFR calc Af Amer 18 (L) >90 mL/min    Comment: (NOTE) The eGFR has been calculated using the CKD EPI equation. This calculation has not been validated in all clinical  situations. eGFR's persistently <90 mL/min signify possible Chronic Kidney Disease.    Anion gap 8 5 - 15  CBC     Status: Abnormal   Collection Time: 05/10/14  4:37 AM  Result Value Ref Range   WBC 11.9 (H) 4.0 - 10.5 K/uL   RBC 3.28 (L) 3.87 - 5.11 MIL/uL   Hemoglobin 9.4 (L) 12.0 - 15.0 g/dL   HCT 29.5 (L) 36.0 - 46.0 %   MCV 89.9 78.0 - 100.0 fL   MCH 28.7 26.0 - 34.0 pg   MCHC 31.9 30.0 - 36.0 g/dL   RDW 14.7 11.5 - 15.5 %   Platelets 128 (L) 150 - 400 K/uL  Glucose, capillary     Status: Abnormal   Collection Time: 05/10/14  7:55 AM  Result Value Ref Range   Glucose-Capillary 152 (H) 70 - 99 mg/dL  Glucose, capillary     Status: None   Collection Time: 05/10/14 11:53 AM  Result Value Ref Range   Glucose-Capillary 91 70 - 99 mg/dL  Occult blood card to lab, stool     Status: Abnormal   Collection Time: 05/10/14  3:10 PM  Result Value Ref Range   Fecal Occult Bld POSITIVE (A) NEGATIVE  Glucose, capillary     Status: Abnormal   Collection Time: 05/10/14  5:23 PM  Result Value Ref Range   Glucose-Capillary 136 (H) 70 - 99 mg/dL  Glucose, capillary     Status: None   Collection Time: 05/10/14  9:49 PM  Result Value Ref Range   Glucose-Capillary 99 70 - 99 mg/dL   Comment 1 Notify RN    Comment 2 Documented in Char   CBC     Status: Abnormal   Collection Time: 05/11/14  6:14 AM  Result Value Ref Range   WBC 8.2 4.0 - 10.5 K/uL   RBC 3.09 (L) 3.87 - 5.11 MIL/uL   Hemoglobin 8.9 (L) 12.0 - 15.0 g/dL   HCT 28.6 (L) 36.0 - 46.0 %   MCV 92.6 78.0 - 100.0 fL   MCH 28.8 26.0 - 34.0 pg   MCHC 31.1 30.0 - 36.0 g/dL   RDW 15.1 11.5 - 15.5 %   Platelets 125 (L) 150 - 400 K/uL  Basic metabolic panel     Status: Abnormal   Collection Time: 05/11/14  6:14 AM  Result Value Ref Range   Sodium 140 135 - 145 mmol/L   Potassium 3.3 (L) 3.5 - 5.1 mmol/L   Chloride 110 96 - 112 mmol/L   CO2 24 19 - 32 mmol/L   Glucose, Bld 125 (H) 70 - 99 mg/dL   BUN 38 (H) 6 - 23 mg/dL    Creatinine, Ser 2.65 (H) 0.50 - 1.10 mg/dL   Calcium 8.0 (L) 8.4 - 10.5 mg/dL   GFR calc non Af Amer 15 (L) >90 mL/min   GFR calc Af Amer 18 (L) >90 mL/min    Comment: (NOTE) The eGFR has been calculated using the CKD EPI equation. This calculation has not been validated  in all clinical situations. eGFR's persistently <90 mL/min signify possible Chronic Kidney Disease.    Anion gap 6 5 - 15  Glucose, capillary     Status: Abnormal   Collection Time: 05/11/14  7:46 AM  Result Value Ref Range   Glucose-Capillary 138 (H) 70 - 99 mg/dL    Imaging / Studies: US Abdomen Complete  05/09/2014   CLINICAL DATA:  Right upper quadrant pain.  EXAM: ULTRASOUND ABDOMEN COMPLETE  COMPARISON:  None.  FINDINGS: Gallbladder: Gallbladder is enlarged with sludge. Gallbladder wall thickening to 5.2 mm diffuse positive Murphy sign. These findings consistent with cholecystitis.  Common bile duct: Diameter: 2.4 mm  Liver: No focal lesion identified. Within normal limits in parenchymal echogenicity.  IVC: No abnormality visualized.  Pancreas: Not visualized due to overlying bowel gas.  Spleen: Size and appearance within normal limits.  Right Kidney: Length: 9.2 cm. Diffuse renal cortical thinning. No mass or hydronephrosis.  Left Kidney:  Obscured by bowel gas.  Abdominal aorta: No aneurysm visualized.  Other findings: None.  IMPRESSION: Enlarged gallbladder with sludge. Gallbladder wall thickening and positive Murphy's sign. These findings are consistent with cholecystitis.   Electronically Signed   By: Marcello Moores  Register   On: 05/09/2014 15:32    Medications / Allergies:  Scheduled Meds: . calcitRIOL  0.25 mcg Oral Daily  . carvedilol  25 mg Oral BID WC  . folic acid  1 mg Oral Daily  . heparin  5,000 Units Subcutaneous 3 times per day  . insulin aspart  0-15 Units Subcutaneous TID WC  . linagliptin  5 mg Oral Daily  . LORazepam  0.5 mg Oral BID  . mirtazapine  7.5 mg Oral QHS  . multivitamin with minerals  1  tablet Oral Daily  . piperacillin-tazobactam (ZOSYN)  IV  2.25 g Intravenous 4 times per day  . simvastatin  20 mg Oral QHS  . thiamine  100 mg Oral Daily   Continuous Infusions: . dextrose 5 % and 0.9% NaCl 50 mL/hr at 05/11/14 0427   PRN Meds:.HYDROmorphone (DILAUDID) injection, meclizine, ondansetron **OR** ondansetron (ZOFRAN) IV  Antibiotics: Anti-infectives    Start     Dose/Rate Route Frequency Ordered Stop   05/11/14 0800  piperacillin-tazobactam (ZOSYN) IVPB 2.25 g     2.25 g 100 mL/hr over 30 Minutes Intravenous 4 times per day 05/11/14 3903          Assessment/Plan Acalculous cholecystitis-will proceed with a percutaneous cholecystostomy tube placement given her medical problems and increased risk for open cholecystectomy.  Hold today's heparin until IR sees and able to schedule NPO Zosyn IVF, pain control SCDs Further management of MMP per primary team.  I spoke with the patients daughter [name].  Erby Pian, [facility] Pager [phone]) For consults and floor pages call [phone](7A-4:30P)  05/11/2014 8:48 AM

## 2014-05-11 NOTE — Progress Notes (Signed)
Received from radiology post drain placement

## 2014-05-12 DIAGNOSIS — K819 Cholecystitis, unspecified: Secondary | ICD-10-CM

## 2014-05-12 LAB — BASIC METABOLIC PANEL
ANION GAP: 5 (ref 5–15)
BUN: 38 mg/dL — ABNORMAL HIGH (ref 6–23)
CALCIUM: 8.1 mg/dL — AB (ref 8.4–10.5)
CO2: 27 mmol/L (ref 19–32)
CREATININE: 2.99 mg/dL — AB (ref 0.50–1.10)
Chloride: 108 mmol/L (ref 96–112)
GFR calc non Af Amer: 13 mL/min — ABNORMAL LOW (ref >90)
GFR, EST AFRICAN AMERICAN: 15 mL/min — AB (ref >90)
Glucose, Bld: 128 mg/dL — ABNORMAL HIGH (ref 70–99)
Potassium: 3.8 mmol/L (ref 3.5–5.1)
Sodium: 140 mmol/L (ref 135–145)

## 2014-05-12 LAB — CBC
HEMATOCRIT: 28.7 % — AB (ref 36.0–46.0)
Hemoglobin: 8.7 g/dL — ABNORMAL LOW (ref 12.0–15.0)
MCH: 28.2 pg (ref 26.0–34.0)
MCHC: 30.3 g/dL (ref 30.0–36.0)
MCV: 92.9 fL (ref 78.0–100.0)
Platelets: 135 10*3/uL — ABNORMAL LOW (ref 150–400)
RBC: 3.09 MIL/uL — ABNORMAL LOW (ref 3.87–5.11)
RDW: 14.9 % (ref 11.5–15.5)
WBC: 7.5 10*3/uL (ref 4.0–10.5)

## 2014-05-12 LAB — GLUCOSE, CAPILLARY
GLUCOSE-CAPILLARY: 153 mg/dL — AB (ref 70–99)
GLUCOSE-CAPILLARY: 139 mg/dL — AB (ref 70–99)
Glucose-Capillary: 120 mg/dL — ABNORMAL HIGH (ref 70–99)
Glucose-Capillary: 103 mg/dL — ABNORMAL HIGH (ref 70–99)

## 2014-05-12 NOTE — Progress Notes (Signed)
Patient ID: [name], female   DOB: Apr 12, 1925, 81 y.o.   MRN: [id number]    Subjective: Pt c/o some RUQ pain this am, but improved.  Hasn't really mobilized much at all.    Objective: Vital signs in last 24 hours: Temp:  [97.5 F (36.4 C)-99.3 F (37.4 C)] 98.1 F (36.7 C) (02/19 0541) Pulse Rate:  [53-75] 75 (02/18 2201) Resp:  [11-33] 20 (02/19 0541) BP: (115-135)/(42-60) 122/46 mmHg (02/19 0541) SpO2:  [92 %-100 %] 92 % (02/19 0541) Weight:  [190 lb 0.6 oz (86.2 kg)] 190 lb 0.6 oz (86.2 kg) (02/19 0500) Last BM Date: 05/10/14  Intake/Output from previous day: 02/18 0701 - 02/19 0700 In: 1030 [P.O.:380; I.V.:600; IV Piggyback:50] Out: 350 [Urine:200; Drains:150] Intake/Output this shift:    PE: Abd: soft, mild RUQ tenderness, perc chole drain in place with good bilious output, +BS  Lab Results:   Recent Labs  05/11/14 0614 05/12/14 0611  WBC 8.2 7.5  HGB 8.9* 8.7*  HCT 28.6* 28.7*  PLT 125* 135*   BMET  Recent Labs  05/11/14 0614 05/12/14 0611  NA 140 140  K 3.3* 3.8  CL 110 108  CO2 24 27  GLUCOSE 125* 128*  BUN 38* 38*  CREATININE 2.65* 2.99*  CALCIUM 8.0* 8.1*   PT/INR  Recent Labs  05/11/14 1140  LABPROT 18.0*  INR 1.47   CMP     Component Value Date/Time   NA 140 05/12/2014 0611   K 3.8 05/12/2014 0611   CL 108 05/12/2014 0611   CO2 27 05/12/2014 0611   GLUCOSE 128* 05/12/2014 0611   BUN 38* 05/12/2014 0611   CREATININE 2.99* 05/12/2014 0611   CALCIUM 8.1* 05/12/2014 0611   PROT 6.1 05/10/2014 0437   ALBUMIN 2.5* 05/10/2014 0437   AST 13 05/10/2014 0437   ALT <5 05/10/2014 0437   ALKPHOS 126* 05/10/2014 0437   BILITOT 1.1 05/10/2014 0437   GFRNONAA 13* 05/12/2014 0611   GFRAA 15* 05/12/2014 0611   Lipase     Component Value Date/Time   LIPASE 32 05/09/2014 2043       Studies/Results: Ir Perc Cholecystostomy  05/11/2014   INDICATION: 81 year old female with a history of acute cholecystitis.  The patient is a poor  surgical candidate from the standpoint of a cholecystectomy given that she has extensive cardiac comorbidities. She has been referred for a percutaneous cholecystostomy tube.  EXAM: ULTRASOUND AND FLUOROSCOPIC-GUIDED CHOLECYSTOSTOMY TUBE PLACEMENT  COMPARISON:  None.  MEDICATIONS: Fentanyl 50 mcg IV; Versed 0.5 mg IV; The patient is currently admitted to the hospital and on intravenous antibiotics. Antibiotics were administered within an appropriate time frame prior to skin puncture.  ANESTHESIA/SEDATION: Total Moderate Sedation Time  Twenty minutes  CONTRAST:  Intraluminal contrast  FLUOROSCOPY TIME:  48 seconds  COMPLICATIONS: None immediate.  PROCEDURE: Informed written consent was obtained from the patient and the patient's family after a discussion of the risks, benefits and alternatives to treatment. Questions regarding the procedure were encouraged and answered, including the potential for a permanent tube draining the gallbladder if the patient is not a surgical candidate. A timeout was performed prior to the initiation of the procedure.  The right upper abdominal quadrant was prepped and draped in the usual sterile fashion, and a sterile drape was applied covering the operative field. Maximum barrier sterile technique with sterile gowns and gloves were used for the procedure. A timeout was performed prior to the initiation of the procedure. Local anesthesia was provided with  1% lidocaine with epinephrine.  Ultrasound scanning of the right upper quadrant demonstrates a markedly dilated gallbladder. Of note, the patient reported pain with ultrasound imaging over the gallbladder. Utilizing a transhepatic approach, a 22 gauge needle was advanced into the gallbladder under direct ultrasound guidance. An ultrasound image was saved for documentation purposes. Appropriate intraluminal puncture was confirmed with the efflux of bile and advancement of an 0.018 wire into the gallbladder lumen. The needle was exchanged  for an Hamilton set. A small amount of contrast was injected to confirm appropriate intraluminal positioning. Over a Benson wire, a 51.2-French Cook cholecystomy tube was advanced into the gallbladder fossa, coiled and locked. Bile was aspirated and a small amount of contrast was injected as several post procedural spot radiographic images were obtained in various obliquities. The catheter was secured to the skin with suture, connected to a drainage bag and a dressing was placed. The patient tolerated the procedure well without immediate post procedural complication.  IMPRESSION: Status post placement of percutaneous cholecystostomy tube to gravity drainage.  Signed,  Dulcy Fanny. Earleen Newport DO  [facility]  [facility]  PLAN: The cholecystostomy tube will remain to gravity drainage during the hospitalization.  Agree with continue antibiotics.  Candidacy for future/interval cholecystectomy will be per general surgery.  If required, future cholecystostomy tube check and future tube exchanges may be performed after an initial time frame of 68 weeks.   Electronically Signed   By: Corrie Mckusick D.O.   On: 05/11/2014 16:45    Anti-infectives: Anti-infectives    Start     Dose/Rate Route Frequency Ordered Stop   05/11/14 0800  piperacillin-tazobactam (ZOSYN) IVPB 2.25 g     2.25 g 100 mL/hr over 30 Minutes Intravenous 4 times per day 05/11/14 0752         Assessment/Plan  1. Acute acalculous cholecystitis  Plan: 1. Cont perc chole drain.  Will advance to clear liquids today.   2. Could likely convert to oral abx therapy soon 3. Patient needs to mobilize.  She wants to go home, but if she doesn't mobilize here, I would be concerned about her going home at 81 yo.  I have asked PT to evaluate her for recommendations.  We will follow.   LOS: 3 days    Breanna Mcdaniel E 05/12/2014, 9:08 AM Pager: [phone]

## 2014-05-12 NOTE — Progress Notes (Signed)
Referring Physician(s): CCS  Subjective: Patient is s/p perc chole 2/18 in IR, she states her RUQ pain is much better today. She is tolerating clear liquids, but still denies any appetite.   Allergies: Codeine; Keflet; Levaquin; Morphine and related; and Oxycodone  Medications: Prior to Admission medications   Medication Sig Start Date End Date Taking? Authorizing Provider  acetaminophen (TYLENOL) 500 MG tablet Take 500 mg by mouth every 6 (six) hours as needed for pain.    Yes Historical Provider, MD  aspirin EC 81 MG tablet Take 81 mg by mouth daily.   Yes Historical Provider, MD  calcitRIOL (ROCALTROL) 0.25 MCG capsule Take 0.25 mcg by mouth daily.  04/06/12  Yes Historical Provider, MD  carvedilol (COREG) 25 MG tablet TAKE 1 TABLET (25 MG TOTAL) BY MOUTH 2 (TWO) TIMES DAILY WITH A MEAL. 03/13/14  Yes [name], MD  docusate sodium (COLACE) 100 MG capsule Take 100 mg by mouth daily as needed for constipation.    Yes Historical Provider, MD  furosemide (LASIX) 80 MG tablet Take 80 mg by mouth daily. 04/10/14  Yes Historical Provider, MD  insulin glargine (LANTUS) 100 UNIT/ML injection Inject 8 Units into the skin at bedtime as needed (if cbg over 150).    Yes Historical Provider, MD  linagliptin (TRADJENTA) 5 MG TABS tablet Take 5 mg by mouth daily.    Yes Historical Provider, MD  LORazepam (ATIVAN) 0.5 MG tablet Take 0.5 mg by mouth 2 (two) times daily.   Yes Historical Provider, MD  meclizine (ANTIVERT) 12.5 MG tablet Take 12.5 mg by mouth as needed for dizziness.  11/23/12  Yes Historical Provider, MD  mirtazapine (REMERON) 7.5 MG tablet Take 7.5 mg by mouth at bedtime.  09/28/12  Yes Historical Provider, MD  promethazine (PHENERGAN) 12.5 MG tablet Take 1 tablet (12.5 mg total) by mouth every 6 (six) hours as needed for nausea or vomiting. 03/14/14  Yes Ripudeep Krystal Eaton, MD  simvastatin (ZOCOR) 20 MG tablet Take 1 tablet (20 mg total) by mouth at bedtime. 04/28/14  Yes [name], MD    vitamin B-12 (CYANOCOBALAMIN) 1000 MCG tablet Take 1,000 mcg by mouth daily.   Yes Historical Provider, MD     Vital Signs: BP 122/46 mmHg  Pulse 75  Temp(Src) 98.1 F (36.7 C) (Oral)  Resp 20  Ht 5\' 3"  (1.6 m)  Wt 190 lb 0.6 oz (86.2 kg)  BMI 33.67 kg/m2  SpO2 92%  Physical Exam General: A&Ox3, NAD, sitting up in chair Abd: Soft, minimal RUQ TTP, ND, (+) BS, RUQ drain dressing C/D/I, dark bilious output 24 hour 150 cc, Cx pending.  Imaging: US Abdomen Complete  05/09/2014   CLINICAL DATA:  Right upper quadrant pain.  EXAM: ULTRASOUND ABDOMEN COMPLETE  COMPARISON:  None.  FINDINGS: Gallbladder: Gallbladder is enlarged with sludge. Gallbladder wall thickening to 5.2 mm diffuse positive Murphy sign. These findings consistent with cholecystitis.  Common bile duct: Diameter: 2.4 mm  Liver: No focal lesion identified. Within normal limits in parenchymal echogenicity.  IVC: No abnormality visualized.  Pancreas: Not visualized due to overlying bowel gas.  Spleen: Size and appearance within normal limits.  Right Kidney: Length: 9.2 cm. Diffuse renal cortical thinning. No mass or hydronephrosis.  Left Kidney:  Obscured by bowel gas.  Abdominal aorta: No aneurysm visualized.  Other findings: None.  IMPRESSION: Enlarged gallbladder with sludge. Gallbladder wall thickening and positive Murphy's sign. These findings are consistent with cholecystitis.   Electronically Signed  By: Milton   On: 05/09/2014 15:32   Ir Perc Cholecystostomy  05/11/2014   INDICATION: 81 year old female with a history of acute cholecystitis.  The patient is a poor surgical candidate from the standpoint of a cholecystectomy given that she has extensive cardiac comorbidities. She has been referred for a percutaneous cholecystostomy tube.  EXAM: ULTRASOUND AND FLUOROSCOPIC-GUIDED CHOLECYSTOSTOMY TUBE PLACEMENT  COMPARISON:  None.  MEDICATIONS: Fentanyl 50 mcg IV; Versed 0.5 mg IV; The patient is currently admitted to the  hospital and on intravenous antibiotics. Antibiotics were administered within an appropriate time frame prior to skin puncture.  ANESTHESIA/SEDATION: Total Moderate Sedation Time  Twenty minutes  CONTRAST:  Intraluminal contrast  FLUOROSCOPY TIME:  48 seconds  COMPLICATIONS: None immediate.  PROCEDURE: Informed written consent was obtained from the patient and the patient's family after a discussion of the risks, benefits and alternatives to treatment. Questions regarding the procedure were encouraged and answered, including the potential for a permanent tube draining the gallbladder if the patient is not a surgical candidate. A timeout was performed prior to the initiation of the procedure.  The right upper abdominal quadrant was prepped and draped in the usual sterile fashion, and a sterile drape was applied covering the operative field. Maximum barrier sterile technique with sterile gowns and gloves were used for the procedure. A timeout was performed prior to the initiation of the procedure. Local anesthesia was provided with 1% lidocaine with epinephrine.  Ultrasound scanning of the right upper quadrant demonstrates a markedly dilated gallbladder. Of note, the patient reported pain with ultrasound imaging over the gallbladder. Utilizing a transhepatic approach, a 22 gauge needle was advanced into the gallbladder under direct ultrasound guidance. An ultrasound image was saved for documentation purposes. Appropriate intraluminal puncture was confirmed with the efflux of bile and advancement of an 0.018 wire into the gallbladder lumen. The needle was exchanged for an [place] set. A small amount of contrast was injected to confirm appropriate intraluminal positioning. Over a Benson wire, a 78.2-French Cook cholecystomy tube was advanced into the gallbladder fossa, coiled and locked. Bile was aspirated and a small amount of contrast was injected as several post procedural spot radiographic images were obtained in  various obliquities. The catheter was secured to the skin with suture, connected to a drainage bag and a dressing was placed. The patient tolerated the procedure well without immediate post procedural complication.  IMPRESSION: Status post placement of percutaneous cholecystostomy tube to gravity drainage.  Signed,  Dulcy Fanny. Earleen Newport DO  [facility]  [facility]  PLAN: The cholecystostomy tube will remain to gravity drainage during the hospitalization.  Agree with continue antibiotics.  Candidacy for future/interval cholecystectomy will be per general surgery.  If required, future cholecystostomy tube check and future tube exchanges may be performed after an initial time frame of 68 weeks.   Electronically Signed   By: Corrie Mckusick D.O.   On: 05/11/2014 16:45    Labs:  CBC:  Recent Labs  05/09/14 2043 05/10/14 0437 05/11/14 0614 05/12/14 0611  WBC 14.8* 11.9* 8.2 7.5  HGB 10.0* 9.4* 8.9* 8.7*  HCT 31.4* 29.5* 28.6* 28.7*  PLT 140* 128* 125* 135*    COAGS:  Recent Labs  03/15/14 0914 05/11/14 1140  INR 1.20 1.47  APTT  --  44*    BMP:  Recent Labs  05/09/14 2043 05/10/14 0437 05/11/14 0614 05/12/14 0611  NA 134* 138 140 140  K 3.0* 3.2* 3.3* 3.8  CL 100  105 110 108  CO2 25 25 24 27   GLUCOSE 169* 139* 125* 128*  BUN 39* 41* 38* 38*  CALCIUM 8.0* 8.0* 8.0* 8.1*  CREATININE 2.65* 2.59* 2.65* 2.99*  GFRNONAA 15* 16* 15* 13*  GFRAA 18* 18* 18* 15*    LIVER FUNCTION TESTS:  Recent Labs  03/13/14 0407 03/15/14 0914 05/09/14 2043 05/10/14 0437  BILITOT 0.4 0.3 1.4* 1.1  AST 11 15 15 13   ALT 5 7 7  <5  ALKPHOS 161* 160* 136* 126*  PROT 5.6* 6.2 5.9* 6.1  ALBUMIN 2.5* 3.1* 2.9* 2.5*    Assessment and Plan: Acute acalculous cholecystitis  S/p percutaneous cholecystostomy tube placement in IR 2/18 Wbc wnl, afebrile, good bilious output, Cx pending, pain improved, tolerating clear liquid diet Plans per  CCS   Signed: Tsosie Billing D 05/12/2014, 10:35 AM   I spent a total of 15 Minutes in face to face in clinical consultation/evaluation, greater than 50% of which was counseling/coordinating care for Acute Cholecystitis.

## 2014-05-12 NOTE — Evaluation (Signed)
Physical Therapy Evaluation Patient Details Name: [name] MRN: [id number] DOB: [birth year] Today's Date: 05/12/2014   History of Present Illness  [name] is a 81 y.o. female presents with RUQ pain. pt with cholecystitis s/p perc chole 2/18. PMHx of DM, retinopathy, colon CA with colostomy, Afib, CHF  Clinical Impression  Pt very nice and eager to return home. Pt with decreased activity tolerance from baseline but dgtr reports family stays with her most of the time and usually only alone for a few hours at a time with report of one fall. Pt encouraged to be OOB for all meals and walk 3x/day with nursing. Pt will benefit from acute therapy to maximize mobility, gait and activity tolerance.     Follow Up Recommendations Home health PT;Supervision for mobility/OOB    Equipment Recommendations  None recommended by PT    Recommendations for Other Services       Precautions / Restrictions Precautions Precautions: Fall      Mobility  Bed Mobility Overal bed mobility: Needs Assistance Bed Mobility: Sit to Sidelying;Rolling Rolling: Min assist       Sit to sidelying: Min assist General bed mobility comments: cues for sequence with assist to elevate legs onto bed  Transfers Overall transfer level: Needs assistance   Transfers: Sit to/from Stand Sit to Stand: Min guard         General transfer comment: cues for hand placement and sequence  Ambulation/Gait Ambulation/Gait assistance: Min guard Ambulation Distance (Feet): 100 Feet Assistive device: Rolling walker (2 wheeled) Gait Pattern/deviations: Step-through pattern;Decreased stride length;Trunk flexed   Gait velocity interpretation: Below normal speed for age/gender General Gait Details: mod cues throughout to step into RW and extend trunk  Stairs            Wheelchair Mobility    Modified Rankin (Stroke Patients Only)       Balance Overall balance assessment: Needs assistance   Sitting  balance-Leahy Scale: Good       Standing balance-Leahy Scale: Fair                               Pertinent Vitals/Pain Pain Assessment: No/denies pain    Home Living Family/patient expects to be discharged to:: Private residence Living Arrangements: Alone Available Help at Discharge: Family;Available 24 hours/day Type of Home: House Home Access: Level entry     Home Layout: One level Home Equipment: Walker - 4 wheels;Bedside commode;Shower seat      Prior Function Level of Independence: Independent with assistive device(s)         Comments: pt uses rollator at home, family drives her and helps with housework     Hand Dominance        Extremity/Trunk Assessment   Upper Extremity Assessment: Generalized weakness           Lower Extremity Assessment: Generalized weakness      Cervical / Trunk Assessment: Kyphotic  Communication   Communication: No difficulties  Cognition Arousal/Alertness: Awake/alert Behavior During Therapy: WFL for tasks assessed/performed Overall Cognitive Status: Within Functional Limits for tasks assessed                      General Comments      Exercises        Assessment/Plan    PT Assessment Patient needs continued PT services  PT Diagnosis Difficulty walking;Acute pain;Generalized weakness   PT Problem List Decreased strength;Decreased activity  tolerance;Decreased balance;Decreased knowledge of use of DME;Pain;Decreased mobility  PT Treatment Interventions Gait training;DME instruction;Functional mobility training;Therapeutic activities;Therapeutic exercise;Patient/family education   PT Goals (Current goals can be found in the Care Plan section) Acute Rehab PT Goals Patient Stated Goal: return home PT Goal Formulation: With patient/family Time For Goal Achievement: 05/26/14 Potential to Achieve Goals: Good    Frequency Min 3X/week   Barriers to discharge        Co-evaluation                End of Session   Activity Tolerance: Patient limited by fatigue Patient left: in bed;with call bell/phone within reach;with family/visitor present           Time: 2334-3568 PT Time Calculation (min) (ACUTE ONLY): 14 min   Charges:   PT Evaluation $Initial PT Evaluation Tier I: 1 Procedure     PT G CodesMelford Aase 05/12/2014, 2:31 PM Elwyn Reach, [place]

## 2014-05-12 NOTE — Progress Notes (Signed)
PROGRESS NOTE  [name] DXA:[id number] DOB: 1925/11/12 DOA: 05/09/2014 PCP: Abigail Miyamoto, MD  Brief history 81 year old female with a history of diabetes mellitus, colon cancer status post right hemicolectomy, hypertension, chronic atrial fibrillation, and CKD stage IV's presented with 2 day history of right upper quadrant and epigastric abdominal pain. The patient had an ultrasound of her abdomen in the outpatient setting which showed an enlarged gallbladder with sludge and gallbladder wall thickening. As a result, the patient was sent to the hospital for further evaluation of possible cholecystitis. General surgery was consulted. Because of the patient's comorbidities, they recommended percutaneous cholecystotomy tube. This was placed on 05/11/2014 with good clinical response.  Assessment/Plan: Acalculous cholecystitis - appreciate surgery followup -2/18-- perc cholecystotomy tube -continue zosyn -switch to po abx in next 24 hrs pending culture data-->2/18 culture-->gram variable rod Chronic atrial fibrillation -Rate controlled -Continue carvedilol and CKD stage IV -not AC candidate due to falls -CHADS-VASc= 5 Chronic diastolic CHF -Compensated at this time -Hold furosemide  Deconditioning -PT evaluation--> recommended home health PT Diabetes mellitus, type II - lantus on hold -cont sliding scale and Tradjenta -05/10/2014 hemoglobin A1c 6.8 Hypertension -Controlled - cont Coreg  CKD (chronic kidney disease), stage IV - Baseline creatinine 2.3-2.6 -Hold furosemide -Judicious IV fluids  Hypokalemia - replace and recheck -Check magnesium History of colon cancer 2002 -Status post right hemicolectomy   Family Communication: Family updated at beside Disposition Plan: Home when medically stable         Procedures/Studies: US Abdomen Complete  05/09/2014   CLINICAL DATA:  Right upper quadrant pain.  EXAM: ULTRASOUND ABDOMEN COMPLETE   COMPARISON:  None.  FINDINGS: Gallbladder: Gallbladder is enlarged with sludge. Gallbladder wall thickening to 5.2 mm diffuse positive Murphy sign. These findings consistent with cholecystitis.  Common bile duct: Diameter: 2.4 mm  Liver: No focal lesion identified. Within normal limits in parenchymal echogenicity.  IVC: No abnormality visualized.  Pancreas: Not visualized due to overlying bowel gas.  Spleen: Size and appearance within normal limits.  Right Kidney: Length: 9.2 cm. Diffuse renal cortical thinning. No mass or hydronephrosis.  Left Kidney:  Obscured by bowel gas.  Abdominal aorta: No aneurysm visualized.  Other findings: None.  IMPRESSION: Enlarged gallbladder with sludge. Gallbladder wall thickening and positive Murphy's sign. These findings are consistent with cholecystitis.   Electronically Signed   By: Marcello Moores  Register   On: 05/09/2014 15:32   Ir Perc Cholecystostomy  05/11/2014   INDICATION: 81 year old female with a history of acute cholecystitis.  The patient is a poor surgical candidate from the standpoint of a cholecystectomy given that she has extensive cardiac comorbidities. She has been referred for a percutaneous cholecystostomy tube.  EXAM: ULTRASOUND AND FLUOROSCOPIC-GUIDED CHOLECYSTOSTOMY TUBE PLACEMENT  COMPARISON:  None.  MEDICATIONS: Fentanyl 50 mcg IV; Versed 0.5 mg IV; The patient is currently admitted to the hospital and on intravenous antibiotics. Antibiotics were administered within an appropriate time frame prior to skin puncture.  ANESTHESIA/SEDATION: Total Moderate Sedation Time  Twenty minutes  CONTRAST:  Intraluminal contrast  FLUOROSCOPY TIME:  48 seconds  COMPLICATIONS: None immediate.  PROCEDURE: Informed written consent was obtained from the patient and the patient's family after a discussion of the risks, benefits and alternatives to treatment. Questions regarding the procedure were encouraged and answered, including the potential for a permanent tube draining the  gallbladder if the patient is not a surgical candidate. A timeout was performed prior to the initiation of  the procedure.  The right upper abdominal quadrant was prepped and draped in the usual sterile fashion, and a sterile drape was applied covering the operative field. Maximum barrier sterile technique with sterile gowns and gloves were used for the procedure. A timeout was performed prior to the initiation of the procedure. Local anesthesia was provided with 1% lidocaine with epinephrine.  Ultrasound scanning of the right upper quadrant demonstrates a markedly dilated gallbladder. Of note, the patient reported pain with ultrasound imaging over the gallbladder. Utilizing a transhepatic approach, a 22 gauge needle was advanced into the gallbladder under direct ultrasound guidance. An ultrasound image was saved for documentation purposes. Appropriate intraluminal puncture was confirmed with the efflux of bile and advancement of an 0.018 wire into the gallbladder lumen. The needle was exchanged for an [place] set. A small amount of contrast was injected to confirm appropriate intraluminal positioning. Over a Benson wire, a 63.2-French Cook cholecystomy tube was advanced into the gallbladder fossa, coiled and locked. Bile was aspirated and a small amount of contrast was injected as several post procedural spot radiographic images were obtained in various obliquities. The catheter was secured to the skin with suture, connected to a drainage bag and a dressing was placed. The patient tolerated the procedure well without immediate post procedural complication.  IMPRESSION: Status post placement of percutaneous cholecystostomy tube to gravity drainage.  Signed,  Dulcy Fanny. Earleen Newport DO  [facility]  [facility]  PLAN: The cholecystostomy tube will remain to gravity drainage during the hospitalization.  Agree with continue antibiotics.  Candidacy for future/interval  cholecystectomy will be per general surgery.  If required, future cholecystostomy tube check and future tube exchanges may be performed after an initial time frame of 68 weeks.   Electronically Signed   By: Corrie Mckusick D.O.   On: 05/11/2014 16:45         Subjective: Patient states that abdominal pain is somewhat better. She denies any fevers, chills, chest pain, chest breath, nausea, vomiting, diarrhea. She is passing flatus.  Objective: Filed Vitals:   05/11/14 1730 05/11/14 2201 05/12/14 0500 05/12/14 0541  BP: 126/59 123/46  122/46  Pulse: 56 75    Temp: 98.3 F (36.8 C) 99.3 F (37.4 C)  98.1 F (36.7 C)  TempSrc: Oral Oral    Resp: 16 16  20   Height:      Weight:   86.2 kg (190 lb 0.6 oz)   SpO2: 96% 93%  92%    Intake/Output Summary (Last 24 hours) at 05/12/14 1513 Last data filed at 05/12/14 1502  Gross per 24 hour  Intake 1760.83 ml  Output    675 ml  Net 1085.83 ml   Weight change: 4.2 kg (9 lb 4.1 oz) Exam:   General:  Pt is alert, follows commands appropriately, not in acute distress  HEENT: No icterus, No thrush,  [place]/AT  Cardiovascular: IRRR, S1/S2, no rubs, no gallops  Respiratory: Left basilar crackles. Right with auscultation. No wheeze  Abdomen: Soft/+BS, RUQ tender without rebound, non distended, no guarding  Extremities: 1+LE edema, No lymphangitis, No petechiae, No rashes, no synovitis  Data Reviewed: Basic Metabolic Panel:  Recent Labs Lab 05/09/14 2043 05/10/14 0437 05/11/14 0614 05/12/14 0611  NA 134* 138 140 140  K 3.0* 3.2* 3.3* 3.8  CL 100 105 110 108  CO2 25 25 24 27   GLUCOSE 169* 139* 125* 128*  BUN 39* 41* 38* 38*  CREATININE 2.65* 2.59* 2.65* 2.99*  CALCIUM 8.0*  8.0* 8.0* 8.1*   Liver Function Tests:  Recent Labs Lab 05/09/14 2043 05/10/14 0437  AST 15 13  ALT 7 <5  ALKPHOS 136* 126*  BILITOT 1.4* 1.1  PROT 5.9* 6.1  ALBUMIN 2.9* 2.5*    Recent Labs Lab 05/09/14 2043  LIPASE 32   No results for  input(s): AMMONIA in the last 168 hours. CBC:  Recent Labs Lab 05/09/14 2043 05/10/14 0437 05/11/14 0614 05/12/14 0611  WBC 14.8* 11.9* 8.2 7.5  NEUTROABS 12.2*  --   --   --   HGB 10.0* 9.4* 8.9* 8.7*  HCT 31.4* 29.5* 28.6* 28.7*  MCV 89.0 89.9 92.6 92.9  PLT 140* 128* 125* 135*   Cardiac Enzymes: No results for input(s): CKTOTAL, CKMB, CKMBINDEX, TROPONINI in the last 168 hours. BNP: Invalid input(s): POCBNP CBG:  Recent Labs Lab 05/11/14 1157 05/11/14 1636 05/11/14 2152 05/12/14 0802 05/12/14 1233  GLUCAP 104* 118* 107* 120* 153*    Recent Results (from the past 240 hour(s))  Culture, routine-abscess     Status: None (Preliminary result)   Collection Time: 05/11/14  3:33 PM  Result Value Ref Range Status   Specimen Description ABSCESS GALL BLADDER  Final   Special Requests NONE  Final   Gram Stain   Final    MODERATE WBC PRESENT, PREDOMINANTLY PMN NO SQUAMOUS EPITHELIAL CELLS SEEN FEW GRAM VARIABLE ROD Performed at [employer] [facility]   Final    Culture reincubated for better growth Performed at [facility]    Report Status PENDING  Incomplete     Scheduled Meds: . calcitRIOL  0.25 mcg Oral Daily  . carvedilol  25 mg Oral BID WC  . folic acid  1 mg Oral Daily  . heparin  5,000 Units Subcutaneous 3 times per day  . insulin aspart  0-15 Units Subcutaneous TID WC  . linagliptin  5 mg Oral Daily  . LORazepam  0.5 mg Oral BID  . mirtazapine  7.5 mg Oral QHS  . multivitamin with minerals  1 tablet Oral Daily  . piperacillin-tazobactam (ZOSYN)  IV  2.25 g Intravenous 4 times per day  . simvastatin  20 mg Oral QHS  . thiamine  100 mg Oral Daily   Continuous Infusions: . dextrose 5 % and 0.9% NaCl 50 mL/hr at 05/11/14 1919     Xzaviar Maloof, DO  [facility] Pager [phone]  If 7PM-7AM, please contact night-coverage [email or web address] Password [email or web address] 05/12/2014, 3:13 PM   LOS: 3 days

## 2014-05-12 NOTE — Care Management Note (Signed)
  Page 1 of 1   05/16/2014     11:27:49 AM CARE MANAGEMENT NOTE 05/16/2014  Patient:  STACIE, TEMPLIN   Account Number:  [id number]  Date Initiated:  05/10/2014  Documentation initiated by:  Magdalen Spatz  Subjective/Objective Assessment:     Action/Plan:   Anticipated DC Date:  05/16/2014   Anticipated DC Plan:  [place]         Choice offered to / List presented to:  C-1 Patient        [place] arranged  HH-1 RN  [place].   Status of service:  Completed, signed off Medicare Important Message given?  YES (If response is "NO", the following Medicare IM given date fields will be blank) Date Medicare IM given:  05/16/2014 Medicare IM given by:  Magdalen Spatz Date Additional Medicare IM given:  05/12/2014 Additional Medicare IM given by:  Magdalen Spatz  Discharge Disposition:    Per UR Regulation:  Reviewed for med. necessity/level of care/duration of stay  If discussed at [place] of Stay Meetings, dates discussed:   05/16/2014    Comments:  05-12-14 PT to evaluate her for recommendations.  Magdalen Spatz RN BSN

## 2014-05-13 ENCOUNTER — Inpatient Hospital Stay (HOSPITAL_COMMUNITY): Payer: Medicare Other

## 2014-05-13 DIAGNOSIS — I5033 Acute on chronic diastolic (congestive) heart failure: Secondary | ICD-10-CM

## 2014-05-13 DIAGNOSIS — I509 Heart failure, unspecified: Secondary | ICD-10-CM

## 2014-05-13 LAB — GLUCOSE, CAPILLARY
GLUCOSE-CAPILLARY: 135 mg/dL — AB (ref 70–99)
GLUCOSE-CAPILLARY: 137 mg/dL — AB (ref 70–99)
Glucose-Capillary: 133 mg/dL — ABNORMAL HIGH (ref 70–99)
Glucose-Capillary: 87 mg/dL (ref 70–99)

## 2014-05-13 LAB — BASIC METABOLIC PANEL
Anion gap: 5 (ref 5–15)
BUN: 38 mg/dL — ABNORMAL HIGH (ref 6–23)
CO2: 24 mmol/L (ref 19–32)
Calcium: 8.5 mg/dL (ref 8.4–10.5)
Chloride: 109 mmol/L (ref 96–112)
Creatinine, Ser: 3.06 mg/dL — ABNORMAL HIGH (ref 0.50–1.10)
GFR, EST AFRICAN AMERICAN: 15 mL/min — AB (ref >90)
GFR, EST NON AFRICAN AMERICAN: 13 mL/min — AB (ref >90)
Glucose, Bld: 122 mg/dL — ABNORMAL HIGH (ref 70–99)
Potassium: 4.2 mmol/L (ref 3.5–5.1)
SODIUM: 138 mmol/L (ref 135–145)

## 2014-05-13 LAB — CBC
HCT: 27.6 % — ABNORMAL LOW (ref 36.0–46.0)
HEMOGLOBIN: 8.5 g/dL — AB (ref 12.0–15.0)
MCH: 28.4 pg (ref 26.0–34.0)
MCHC: 30.8 g/dL (ref 30.0–36.0)
MCV: 92.3 fL (ref 78.0–100.0)
Platelets: 134 10*3/uL — ABNORMAL LOW (ref 150–400)
RBC: 2.99 MIL/uL — ABNORMAL LOW (ref 3.87–5.11)
RDW: 14.9 % (ref 11.5–15.5)
WBC: 6.4 10*3/uL (ref 4.0–10.5)

## 2014-05-13 MED ORDER — FUROSEMIDE 10 MG/ML IJ SOLN: 40.0000 mg | Freq: Two times a day (BID) | INTRAMUSCULAR | Status: DC

## 2014-05-13 MED ORDER — FUROSEMIDE 10 MG/ML IJ SOLN
60.0000 mg | Freq: Two times a day (BID) | INTRAMUSCULAR | Status: DC
Start: 2014-05-13 — End: 2014-05-13
  Administered 2014-05-13: 60 mg via INTRAVENOUS
  Filled 2014-05-13: qty 6

## 2014-05-13 MED ORDER — HYDROCODONE-ACETAMINOPHEN 5-325 MG PO TABS
1.0000 | ORAL_TABLET | ORAL | Status: DC | PRN
  Administered 2014-05-13 – 2014-05-15 (×3): 1 via ORAL
  Filled 2014-05-13 (×3): qty 1

## 2014-05-13 MED ORDER — FUROSEMIDE 10 MG/ML IJ SOLN
60.0000 mg | Freq: Two times a day (BID) | INTRAMUSCULAR | Status: DC
  Administered 2014-05-13 – 2014-05-14 (×2): 60 mg via INTRAVENOUS
  Filled 2014-05-13 (×2): qty 6

## 2014-05-13 NOTE — Progress Notes (Signed)
  Echocardiogram 2D Echocardiogram has been performed.  Lysle Rubens 05/13/2014, 4:06 PM

## 2014-05-13 NOTE — Progress Notes (Signed)
PROGRESS NOTE  SWAYZEE WADLEY LEX:[id number] DOB: February 24, 1926 DOA: 05/09/2014 PCP: Abigail Miyamoto, MD   Brief history  81 year old female with a history of diabetes mellitus, colon cancer status post right hemicolectomy, hypertension, chronic atrial fibrillation, and CKD stage IV's presented with 2 day history of right upper quadrant and epigastric abdominal pain. The patient had an ultrasound of her abdomen in the outpatient setting which showed an enlarged gallbladder with sludge and gallbladder wall thickening. As a result, the patient was sent to the hospital for further evaluation of possible cholecystitis. General surgery was consulted. Because of the patient's comorbidities, they recommended percutaneous cholecystotomy tube. This was placed on 05/11/2014 with good clinical response.  Assessment/Plan: Acalculous cholecystitis - appreciate surgery followup -2/18-- perc cholecystotomy tube -continue zosyn -switch to po abx in next 24 hrs pending culture data-->2/18 culture-->gram neg rod Chronic atrial fibrillation -Rate controlled -Continue carvedilol and CKD stage IV -not AC candidate due to falls -CHADS-VASc= 5 Acute on Chronic diastolic CHF -4/49--QP developed dyspnea -2/20 CXR--pulm edema and b/l pleural effusions -saline lock  IVF -start IV Lasix 60mg  IV q 12 hr -echo -dailyt weights Deconditioning -PT evaluation--> recommended home health PT Diabetes mellitus, type II - lantus on hold -cont sliding scale and Tradjenta -05/10/2014 hemoglobin A1c 6.8 -CBGs remain controlled Hypertension - cont Coreg -anticipate improvement with diuresis CKD (chronic kidney disease), stage IV - Baseline creatinine 2.3-2.6 -monitor closely on IV lasix  Hypokalemia - replace and recheck -Check magnesium History of colon cancer 2002 -Status post right hemicolectomy   Family Communication: Family updated at beside Disposition Plan: Home when medically  stable   Procedures/Studies: Dg Chest 2 View  05/13/2014   CLINICAL DATA:  Dyspnea and productive cough. Acute cholecystitis and status post percutaneous cholecystostomy tube placement.  EXAM: CHEST - 2 VIEW  COMPARISON:  04/17/2014 study at [place] at [facility].  FINDINGS: There is development of new interstitial edema with associated bilateral pleural effusions, right greater than left. Bilateral lower lobe opacities likely represent atelectasis. However, component of bibasilar pneumonia cannot be excluded. Opacity at the right lung base is more prominent than at the left. No pneumothorax. The heart size is normal.  IMPRESSION: New interstitial edema with bilateral pleural effusions, right greater than left. Bilateral lower lobe opacities, right greater than left, representing atelectasis and/or infiltrates.   Electronically Signed   By: Aletta Edouard M.D.   On: 05/13/2014 09:22   US Abdomen Complete  05/09/2014   CLINICAL DATA:  Right upper quadrant pain.  EXAM: ULTRASOUND ABDOMEN COMPLETE  COMPARISON:  None.  FINDINGS: Gallbladder: Gallbladder is enlarged with sludge. Gallbladder wall thickening to 5.2 mm diffuse positive Murphy sign. These findings consistent with cholecystitis.  Common bile duct: Diameter: 2.4 mm  Liver: No focal lesion identified. Within normal limits in parenchymal echogenicity.  IVC: No abnormality visualized.  Pancreas: Not visualized due to overlying bowel gas.  Spleen: Size and appearance within normal limits.  Right Kidney: Length: 9.2 cm. Diffuse renal cortical thinning. No mass or hydronephrosis.  Left Kidney:  Obscured by bowel gas.  Abdominal aorta: No aneurysm visualized.  Other findings: None.  IMPRESSION: Enlarged gallbladder with sludge. Gallbladder wall thickening and positive Murphy's sign. These findings are consistent with cholecystitis.   Electronically Signed   By: Marcello Moores  Register   On: 05/09/2014 15:32   Ir Perc Cholecystostomy  05/11/2014   INDICATION:  80 year old female with a history of acute cholecystitis.  The patient  is a poor surgical candidate from the standpoint of a cholecystectomy given that she has extensive cardiac comorbidities. She has been referred for a percutaneous cholecystostomy tube.  EXAM: ULTRASOUND AND FLUOROSCOPIC-GUIDED CHOLECYSTOSTOMY TUBE PLACEMENT  COMPARISON:  None.  MEDICATIONS: Fentanyl 50 mcg IV; Versed 0.5 mg IV; The patient is currently admitted to the hospital and on intravenous antibiotics. Antibiotics were administered within an appropriate time frame prior to skin puncture.  ANESTHESIA/SEDATION: Total Moderate Sedation Time  Twenty minutes  CONTRAST:  Intraluminal contrast  FLUOROSCOPY TIME:  48 seconds  COMPLICATIONS: None immediate.  PROCEDURE: Informed written consent was obtained from the patient and the patient's family after a discussion of the risks, benefits and alternatives to treatment. Questions regarding the procedure were encouraged and answered, including the potential for a permanent tube draining the gallbladder if the patient is not a surgical candidate. A timeout was performed prior to the initiation of the procedure.  The right upper abdominal quadrant was prepped and draped in the usual sterile fashion, and a sterile drape was applied covering the operative field. Maximum barrier sterile technique with sterile gowns and gloves were used for the procedure. A timeout was performed prior to the initiation of the procedure. Local anesthesia was provided with 1% lidocaine with epinephrine.  Ultrasound scanning of the right upper quadrant demonstrates a markedly dilated gallbladder. Of note, the patient reported pain with ultrasound imaging over the gallbladder. Utilizing a transhepatic approach, a 22 gauge needle was advanced into the gallbladder under direct ultrasound guidance. An ultrasound image was saved for documentation purposes. Appropriate intraluminal puncture was confirmed with the efflux of bile and  advancement of an 0.018 wire into the gallbladder lumen. The needle was exchanged for an [place] set. A small amount of contrast was injected to confirm appropriate intraluminal positioning. Over a Benson wire, a 25.2-French Cook cholecystomy tube was advanced into the gallbladder fossa, coiled and locked. Bile was aspirated and a small amount of contrast was injected as several post procedural spot radiographic images were obtained in various obliquities. The catheter was secured to the skin with suture, connected to a drainage bag and a dressing was placed. The patient tolerated the procedure well without immediate post procedural complication.  IMPRESSION: Status post placement of percutaneous cholecystostomy tube to gravity drainage.  Signed,  Dulcy Fanny. Earleen Newport DO  [facility]  [facility]  PLAN: The cholecystostomy tube will remain to gravity drainage during the hospitalization.  Agree with continue antibiotics.  Candidacy for future/interval cholecystectomy will be per general surgery.  If required, future cholecystostomy tube check and future tube exchanges may be performed after an initial time frame of 68 weeks.   Electronically Signed   By: Corrie Mckusick D.O.   On: 05/11/2014 16:45         Subjective: Pt developed SOB and orthopnea today.  Abd pain is improving.  Denies any cp, n/v/d, dysuria.  Objective: Filed Vitals:   05/13/14 0500 05/13/14 0543 05/13/14 0730 05/13/14 1500  BP:  139/57 168/70 162/73  Pulse:  66 72 72  Temp:  97.9 F (36.6 C)  98 F (36.7 C)  TempSrc:  Oral  Oral  Resp:  16 16 16   Height:      Weight: 83.2 kg (183 lb 6.8 oz)     SpO2:  92%  92%    Intake/Output Summary (Last 24 hours) at 05/13/14 1613 Last data filed at 05/13/14 1421  Gross per 24 hour  Intake  785 ml  Output    100 ml  Net    685 ml   Weight change: -3 kg (-6 lb 9.8 oz) Exam:   General:  Pt is alert, follows commands appropriately,  not in acute distress  HEENT: No icterus, No thrush, [place]/AT  Cardiovascular: RRR, S1/S2, no rubs, no gallops  Respiratory: bibasilar crackles, no wheeze  Abdomen: Soft/+BS, non tender, non distended, no guarding  Extremities: 1+LE edema, No lymphangitis, No petechiae, No rashes, no synovitis  Data Reviewed: Basic Metabolic Panel:  Recent Labs Lab 05/09/14 2043 05/10/14 0437 05/11/14 0614 05/12/14 0611 05/13/14 0450  NA 134* 138 140 140 138  K 3.0* 3.2* 3.3* 3.8 4.2  CL 100 105 110 108 109  CO2 25 25 24 27 24   GLUCOSE 169* 139* 125* 128* 122*  BUN 39* 41* 38* 38* 38*  CREATININE 2.65* 2.59* 2.65* 2.99* 3.06*  CALCIUM 8.0* 8.0* 8.0* 8.1* 8.5   Liver Function Tests:  Recent Labs Lab 05/09/14 2043 05/10/14 0437  AST 15 13  ALT 7 <5  ALKPHOS 136* 126*  BILITOT 1.4* 1.1  PROT 5.9* 6.1  ALBUMIN 2.9* 2.5*    Recent Labs Lab 05/09/14 2043  LIPASE 32   No results for input(s): AMMONIA in the last 168 hours. CBC:  Recent Labs Lab 05/09/14 2043 05/10/14 0437 05/11/14 0614 05/12/14 0611 05/13/14 0450  WBC 14.8* 11.9* 8.2 7.5 6.4  NEUTROABS 12.2*  --   --   --   --   HGB 10.0* 9.4* 8.9* 8.7* 8.5*  HCT 31.4* 29.5* 28.6* 28.7* 27.6*  MCV 89.0 89.9 92.6 92.9 92.3  PLT 140* 128* 125* 135* 134*   Cardiac Enzymes: No results for input(s): CKTOTAL, CKMB, CKMBINDEX, TROPONINI in the last 168 hours. BNP: Invalid input(s): POCBNP CBG:  Recent Labs Lab 05/12/14 1233 05/12/14 1744 05/12/14 2105 05/13/14 0736 05/13/14 1131  GLUCAP 153* 103* 139* 133* 87    Recent Results (from the past 240 hour(s))  Culture, routine-abscess     Status: None (Preliminary result)   Collection Time: 05/11/14  3:33 PM  Result Value Ref Range Status   Specimen Description ABSCESS GALL BLADDER  Final   Special Requests NONE  Final   Gram Stain   Final    MODERATE WBC PRESENT, PREDOMINANTLY PMN NO SQUAMOUS EPITHELIAL CELLS SEEN FEW GRAM VARIABLE ROD Performed at [facility]    Culture   Final    MODERATE GRAM NEGATIVE RODS Performed at [facility]    Report Status PENDING  Incomplete     Scheduled Meds: . calcitRIOL  0.25 mcg Oral Daily  . carvedilol  25 mg Oral BID WC  . folic acid  1 mg Oral Daily  . furosemide  60 mg Intravenous Q12H  . heparin  5,000 Units Subcutaneous 3 times per day  . insulin aspart  0-15 Units Subcutaneous TID WC  . linagliptin  5 mg Oral Daily  . LORazepam  0.5 mg Oral BID  . mirtazapine  7.5 mg Oral QHS  . multivitamin with minerals  1 tablet Oral Daily  . piperacillin-tazobactam (ZOSYN)  IV  2.25 g Intravenous 4 times per day  . simvastatin  20 mg Oral QHS  . thiamine  100 mg Oral Daily   Continuous Infusions:    Graclynn Vanantwerp, DO  [facility] Pager [phone]  If 7PM-7AM, please contact night-coverage [email or web address] Password [email or web address] 05/13/2014, 4:13 PM   LOS: 4 days

## 2014-05-13 NOTE — Progress Notes (Signed)
Patient At [place]  complaining of sick stomach ,prn med given,patient is alert and oriented, assisted to [facility] but when patient assisted back to bed, patient stated she is having hard time breathing specially when HOB down, assisted patient to bed with HOB eleavted, VSS, O2 sat 96-98 % . Patient stated she felt better but noted that patient to be continously cough and trying to clear her throat. MD made aware. New order given

## 2014-05-13 NOTE — Progress Notes (Signed)
Patient ID: [name], female   DOB: May 01, 1925, 81 y.o.   MRN: [id number]    Subjective: Pt complains of cough.  Just got CXR, has pulmonary edema.    Objective: Vital signs in last 24 hours: Temp:  [97.9 F (36.6 C)-98.8 F (37.1 C)] 97.9 F (36.6 C) (02/20 0543) Pulse Rate:  [64-72] 72 (02/20 0730) Resp:  [16-17] 16 (02/20 0730) BP: (124-168)/(49-70) 168/70 mmHg (02/20 0730) SpO2:  [92 %] 92 % (02/20 0543) Weight:  [183 lb 6.8 oz (83.2 kg)] 183 lb 6.8 oz (83.2 kg) (02/20 0500) Last BM Date: 05/10/14  Intake/Output from previous day: 02/19 0701 - 02/20 0700 In: 1595.8 [P.O.:360; I.V.:1185.8; IV Piggyback:50] Out: 350 [Urine:250; Drains:100] Intake/Output this shift:    PE: Abd: soft, mild RUQ tenderness, perc chole drain in place with good bilious output  Lab Results:   Recent Labs  05/12/14 0611 05/13/14 0450  WBC 7.5 6.4  HGB 8.7* 8.5*  HCT 28.7* 27.6*  PLT 135* 134*   BMET  Recent Labs  05/12/14 0611 05/13/14 0450  NA 140 138  K 3.8 4.2  CL 108 109  CO2 27 24  GLUCOSE 128* 122*  BUN 38* 38*  CREATININE 2.99* 3.06*  CALCIUM 8.1* 8.5   PT/INR  Recent Labs  05/11/14 1140  LABPROT 18.0*  INR 1.47   CMP     Component Value Date/Time   NA 138 05/13/2014 0450   K 4.2 05/13/2014 0450   CL 109 05/13/2014 0450   CO2 24 05/13/2014 0450   GLUCOSE 122* 05/13/2014 0450   BUN 38* 05/13/2014 0450   CREATININE 3.06* 05/13/2014 0450   CALCIUM 8.5 05/13/2014 0450   PROT 6.1 05/10/2014 0437   ALBUMIN 2.5* 05/10/2014 0437   AST 13 05/10/2014 0437   ALT <5 05/10/2014 0437   ALKPHOS 126* 05/10/2014 0437   BILITOT 1.1 05/10/2014 0437   GFRNONAA 13* 05/13/2014 0450   GFRAA 15* 05/13/2014 0450   Lipase     Component Value Date/Time   LIPASE 32 05/09/2014 2043       Studies/Results: Dg Chest 2 View  05/13/2014   CLINICAL DATA:  Dyspnea and productive cough. Acute cholecystitis and status post percutaneous cholecystostomy tube placement.  EXAM:  CHEST - 2 VIEW  COMPARISON:  04/17/2014 study at [place] at [facility].  FINDINGS: There is development of new interstitial edema with associated bilateral pleural effusions, right greater than left. Bilateral lower lobe opacities likely represent atelectasis. However, component of bibasilar pneumonia cannot be excluded. Opacity at the right lung base is more prominent than at the left. No pneumothorax. The heart size is normal.  IMPRESSION: New interstitial edema with bilateral pleural effusions, right greater than left. Bilateral lower lobe opacities, right greater than left, representing atelectasis and/or infiltrates.   Electronically Signed   By: Aletta Edouard M.D.   On: 05/13/2014 09:22   Ir Perc Cholecystostomy  05/11/2014   INDICATION: 81 year old female with a history of acute cholecystitis.  The patient is a poor surgical candidate from the standpoint of a cholecystectomy given that she has extensive cardiac comorbidities. She has been referred for a percutaneous cholecystostomy tube.  EXAM: ULTRASOUND AND FLUOROSCOPIC-GUIDED CHOLECYSTOSTOMY TUBE PLACEMENT  COMPARISON:  None.  MEDICATIONS: Fentanyl 50 mcg IV; Versed 0.5 mg IV; The patient is currently admitted to the hospital and on intravenous antibiotics. Antibiotics were administered within an appropriate time frame prior to skin puncture.  ANESTHESIA/SEDATION: Total Moderate Sedation Time  Twenty minutes  CONTRAST:  Intraluminal contrast  FLUOROSCOPY TIME:  48 seconds  COMPLICATIONS: None immediate.  PROCEDURE: Informed written consent was obtained from the patient and the patient's family after a discussion of the risks, benefits and alternatives to treatment. Questions regarding the procedure were encouraged and answered, including the potential for a permanent tube draining the gallbladder if the patient is not a surgical candidate. A timeout was performed prior to the initiation of the procedure.  The right upper abdominal quadrant was prepped  and draped in the usual sterile fashion, and a sterile drape was applied covering the operative field. Maximum barrier sterile technique with sterile gowns and gloves were used for the procedure. A timeout was performed prior to the initiation of the procedure. Local anesthesia was provided with 1% lidocaine with epinephrine.  Ultrasound scanning of the right upper quadrant demonstrates a markedly dilated gallbladder. Of note, the patient reported pain with ultrasound imaging over the gallbladder. Utilizing a transhepatic approach, a 22 gauge needle was advanced into the gallbladder under direct ultrasound guidance. An ultrasound image was saved for documentation purposes. Appropriate intraluminal puncture was confirmed with the efflux of bile and advancement of an 0.018 wire into the gallbladder lumen. The needle was exchanged for an [employer] set. A small amount of contrast was injected to confirm appropriate intraluminal positioning. Over a Benson wire, a 60.2-French Cook cholecystomy tube was advanced into the gallbladder fossa, coiled and locked. Bile was aspirated and a small amount of contrast was injected as several post procedural spot radiographic images were obtained in various obliquities. The catheter was secured to the skin with suture, connected to a drainage bag and a dressing was placed. The patient tolerated the procedure well without immediate post procedural complication.  IMPRESSION: Status post placement of percutaneous cholecystostomy tube to gravity drainage.  Signed,  Dulcy Fanny. Earleen Newport DO  [facility]  [facility]  PLAN: The cholecystostomy tube will remain to gravity drainage during the hospitalization.  Agree with continue antibiotics.  Candidacy for future/interval cholecystectomy will be per general surgery.  If required, future cholecystostomy tube check and future tube exchanges may be performed after an initial time frame of 68 weeks.    Electronically Signed   By: Corrie Mckusick D.O.   On: 05/11/2014 16:45    Anti-infectives: Anti-infectives    Start     Dose/Rate Route Frequency Ordered Stop   05/11/14 0800  piperacillin-tazobactam (ZOSYN) IVPB 2.25 g     2.25 g 100 mL/hr over 30 Minutes Intravenous 4 times per day 05/11/14 0752         Assessment/Plan  1. Acute acalculous cholecystitis  Plan: 1. Cont perc chole drain.  Will advance to full liquids.   2. Could likely convert to oral abx therapy soon 3. Cough may be secondary to pulmonary edema/secretions vs infiltrates. Defer to Dr. Carles Collet for management of that.      LOS: 4 days    Zoella Roberti 05/13/2014, 12:27 PM Pager: [phone]

## 2014-05-14 DIAGNOSIS — I5033 Acute on chronic diastolic (congestive) heart failure: Secondary | ICD-10-CM

## 2014-05-14 DIAGNOSIS — I482 Chronic atrial fibrillation: Secondary | ICD-10-CM

## 2014-05-14 DIAGNOSIS — I6523 Occlusion and stenosis of bilateral carotid arteries: Secondary | ICD-10-CM

## 2014-05-14 DIAGNOSIS — E785 Hyperlipidemia, unspecified: Secondary | ICD-10-CM

## 2014-05-14 DIAGNOSIS — I350 Nonrheumatic aortic (valve) stenosis: Secondary | ICD-10-CM

## 2014-05-14 DIAGNOSIS — I77810 Thoracic aortic ectasia: Secondary | ICD-10-CM

## 2014-05-14 LAB — CULTURE, ROUTINE-ABSCESS

## 2014-05-14 LAB — BASIC METABOLIC PANEL
Anion gap: 5 (ref 5–15)
BUN: 36 mg/dL — ABNORMAL HIGH (ref 6–23)
CALCIUM: 8.2 mg/dL — AB (ref 8.4–10.5)
CO2: 26 mmol/L (ref 19–32)
Chloride: 110 mmol/L (ref 96–112)
Creatinine, Ser: 3.28 mg/dL — ABNORMAL HIGH (ref 0.50–1.10)
GFR calc Af Amer: 14 mL/min — ABNORMAL LOW (ref >90)
GFR calc non Af Amer: 12 mL/min — ABNORMAL LOW (ref >90)
Glucose, Bld: 90 mg/dL (ref 70–99)
Potassium: 4.5 mmol/L (ref 3.5–5.1)
SODIUM: 141 mmol/L (ref 135–145)

## 2014-05-14 LAB — MAGNESIUM: Magnesium: 2.2 mg/dL (ref 1.5–2.5)

## 2014-05-14 LAB — GLUCOSE, CAPILLARY
GLUCOSE-CAPILLARY: 86 mg/dL (ref 70–99)
Glucose-Capillary: 136 mg/dL — ABNORMAL HIGH (ref 70–99)
Glucose-Capillary: 95 mg/dL (ref 70–99)
Glucose-Capillary: 128 mg/dL — ABNORMAL HIGH (ref 70–99)

## 2014-05-14 MED ORDER — CIPROFLOXACIN HCL 500 MG PO TABS
500.0000 mg | ORAL_TABLET | Freq: Every day | ORAL | Status: DC
  Administered 2014-05-14 – 2014-05-15 (×2): 500 mg via ORAL
  Filled 2014-05-14 (×2): qty 1

## 2014-05-14 MED ORDER — FUROSEMIDE 40 MG PO TABS
40.0000 mg | ORAL_TABLET | Freq: Every day | ORAL | Status: DC
  Administered 2014-05-15 – 2014-05-16 (×2): 40 mg via ORAL
  Filled 2014-05-14 (×2): qty 1

## 2014-05-14 NOTE — Progress Notes (Signed)
  Subjective: Feels better, hungry  Objective: Vital signs in last 24 hours: Temp:  [97.5 F (36.4 C)-98.1 F (36.7 C)] 97.5 F (36.4 C) (02/21 0543) Pulse Rate:  [64-72] 64 (02/21 0543) Resp:  [15-16] 16 (02/21 0543) BP: (111-162)/(58-73) 125/58 mmHg (02/21 0543) SpO2:  [92 %-98 %] 98 % (02/20 2151) Weight:  [178 lb 5.6 oz (80.9 kg)] 178 lb 5.6 oz (80.9 kg) (02/21 0543) Last BM Date: 05/13/14  Intake/Output from previous day: 02/20 0701 - 02/21 0700 In: -  Out: 1405 [Urine:1000; Drains:205; Stool:200] Intake/Output this shift: Total I/O In: -  Out: 300 [Urine:300]  GI: soft nt/nd drain with bilious output  Lab Results:   Recent Labs  05/12/14 0611 05/13/14 0450  WBC 7.5 6.4  HGB 8.7* 8.5*  HCT 28.7* 27.6*  PLT 135* 134*   BMET  Recent Labs  05/13/14 0450 05/14/14 0432  NA 138 141  K 4.2 4.5  CL 109 110  CO2 24 26  GLUCOSE 122* 90  BUN 38* 36*  CREATININE 3.06* 3.28*  CALCIUM 8.5 8.2*   PT/INR  Recent Labs  05/11/14 1140  LABPROT 18.0*  INR 1.47   ABG No results for input(s): PHART, HCO3 in the last 72 hours.  Invalid input(s): PCO2, PO2  Studies/Results: Dg Chest 2 View  05/13/2014   CLINICAL DATA:  Dyspnea and productive cough. Acute cholecystitis and status post percutaneous cholecystostomy tube placement.  EXAM: CHEST - 2 VIEW  COMPARISON:  04/17/2014 study at [place] at [facility].  FINDINGS: There is development of new interstitial edema with associated bilateral pleural effusions, right greater than left. Bilateral lower lobe opacities likely represent atelectasis. However, component of bibasilar pneumonia cannot be excluded. Opacity at the right lung base is more prominent than at the left. No pneumothorax. The heart size is normal.  IMPRESSION: New interstitial edema with bilateral pleural effusions, right greater than left. Bilateral lower lobe opacities, right greater than left, representing atelectasis and/or infiltrates.   Electronically  Signed   By: Aletta Edouard M.D.   On: 05/13/2014 09:22    Anti-infectives: Anti-infectives    Start     Dose/Rate Route Frequency Ordered Stop   05/11/14 0800  piperacillin-tazobactam (ZOSYN) IVPB 2.25 g     2.25 g 100 mL/hr over 30 Minutes Intravenous 4 times per day 05/11/14 2111        Assessment/Plan: Acute cholecystitis  She is doing better from gb. I will write for regular diet, she can have oral abx course of 7 days for gb whenever medical team wants to transition. She does not really have acalculous cholecystitis as in notes previously as she does have sludge. She should return to see [place] for consideration of cholecystectomy/drain study but may end up being nonoperative candidate.   [facility] 05/14/2014

## 2014-05-14 NOTE — Progress Notes (Signed)
PROGRESS NOTE  [name]:[id number] DOB: Mar 16, 1926 DOA: 05/09/2014 PCP: Abigail Miyamoto, MD  Assessment and Plan Acalculous Acute cholecystitis - appreciate surgery followup -2/18-- perc cholecystotomy tube -2/18--GB drainage culture= Citrobacter -d/c zosyn -start Cipro Chronic atrial fibrillation -Rate controlled -Continue carvedilol  -not AC candidate due to falls -CHADS-VASc= 5 Acute on Chronic diastolic CHF -2/77--OE developed dyspnea -2/20 CXR--pulm edema and b/l pleural effusions -saline lock IVF -start IV Lasix 60mg  IV q 12 hr--hold as serum creatinine going up with 3 doses -05/13/14 echo--EF 65-70%, mod-severe AS (increased valve gradient compared to 10/26/13) -consult cardiology--spoke with Dr. Bronson Ing -dailyt weights Deconditioning -PT evaluation--> recommended home health PT Diabetes mellitus, type II - lantus on hold -cont sliding scale and Tradjenta -05/10/2014 hemoglobin A1c 6.8 -CBGs remain controlled Hypertension - cont Coreg -improvement with diuresis CKD (chronic kidney disease), stage IV - Baseline creatinine 2.3-2.6 -monitored closely on IV lasix -stop lasix Hypokalemia - replace and recheck -Check magnesium--2.2 History of colon cancer 2002 -Status post right hemicolectomy   Family Communication: Family updated at beside Disposition Plan: Home when medically stable   Procedures/Studies: Dg Chest 2 View  05/13/2014   CLINICAL DATA:  Dyspnea and productive cough. Acute cholecystitis and status post percutaneous cholecystostomy tube placement.  EXAM: CHEST - 2 VIEW  COMPARISON:  04/17/2014 study at [facility].  FINDINGS: There is development of new interstitial edema with associated bilateral pleural effusions, right greater than left. Bilateral lower lobe opacities likely represent atelectasis. However, component of bibasilar pneumonia cannot be excluded. Opacity at the right lung base is more prominent than at  the left. No pneumothorax. The heart size is normal.  IMPRESSION: New interstitial edema with bilateral pleural effusions, right greater than left. Bilateral lower lobe opacities, right greater than left, representing atelectasis and/or infiltrates.   Electronically Signed   By: Aletta Edouard M.D.   On: 05/13/2014 09:22   US Abdomen Complete  05/09/2014   CLINICAL DATA:  Right upper quadrant pain.  EXAM: ULTRASOUND ABDOMEN COMPLETE  COMPARISON:  None.  FINDINGS: Gallbladder: Gallbladder is enlarged with sludge. Gallbladder wall thickening to 5.2 mm diffuse positive Murphy sign. These findings consistent with cholecystitis.  Common bile duct: Diameter: 2.4 mm  Liver: No focal lesion identified. Within normal limits in parenchymal echogenicity.  IVC: No abnormality visualized.  Pancreas: Not visualized due to overlying bowel gas.  Spleen: Size and appearance within normal limits.  Right Kidney: Length: 9.2 cm. Diffuse renal cortical thinning. No mass or hydronephrosis.  Left Kidney:  Obscured by bowel gas.  Abdominal aorta: No aneurysm visualized.  Other findings: None.  IMPRESSION: Enlarged gallbladder with sludge. Gallbladder wall thickening and positive Murphy's sign. These findings are consistent with cholecystitis.   Electronically Signed   By: Marcello Moores  Register   On: 05/09/2014 15:32   Ir Perc Cholecystostomy  05/11/2014   INDICATION: 81 year old female with a history of acute cholecystitis.  The patient is a poor surgical candidate from the standpoint of a cholecystectomy given that she has extensive cardiac comorbidities. She has been referred for a percutaneous cholecystostomy tube.  EXAM: ULTRASOUND AND FLUOROSCOPIC-GUIDED CHOLECYSTOSTOMY TUBE PLACEMENT  COMPARISON:  None.  MEDICATIONS: Fentanyl 50 mcg IV; Versed 0.5 mg IV; The patient is currently admitted to the hospital and on intravenous antibiotics. Antibiotics were administered within an appropriate time frame prior to skin puncture.   ANESTHESIA/SEDATION: Total Moderate Sedation Time  Twenty minutes  CONTRAST:  Intraluminal contrast  FLUOROSCOPY TIME:  48 seconds  COMPLICATIONS: None immediate.  PROCEDURE: Informed written consent was obtained from the patient and the patient's family after a discussion of the risks, benefits and alternatives to treatment. Questions regarding the procedure were encouraged and answered, including the potential for a permanent tube draining the gallbladder if the patient is not a surgical candidate. A timeout was performed prior to the initiation of the procedure.  The right upper abdominal quadrant was prepped and draped in the usual sterile fashion, and a sterile drape was applied covering the operative field. Maximum barrier sterile technique with sterile gowns and gloves were used for the procedure. A timeout was performed prior to the initiation of the procedure. Local anesthesia was provided with 1% lidocaine with epinephrine.  Ultrasound scanning of the right upper quadrant demonstrates a markedly dilated gallbladder. Of note, the patient reported pain with ultrasound imaging over the gallbladder. Utilizing a transhepatic approach, a 22 gauge needle was advanced into the gallbladder under direct ultrasound guidance. An ultrasound image was saved for documentation purposes. Appropriate intraluminal puncture was confirmed with the efflux of bile and advancement of an 0.018 wire into the gallbladder lumen. The needle was exchanged for an [place] set. A small amount of contrast was injected to confirm appropriate intraluminal positioning. Over a Benson wire, a 46.2-French Cook cholecystomy tube was advanced into the gallbladder fossa, coiled and locked. Bile was aspirated and a small amount of contrast was injected as several post procedural spot radiographic images were obtained in various obliquities. The catheter was secured to the skin with suture, connected to a drainage bag and a dressing was placed. The  patient tolerated the procedure well without immediate post procedural complication.  IMPRESSION: Status post placement of percutaneous cholecystostomy tube to gravity drainage.  Signed,  Dulcy Fanny. Earleen Newport DO  [facility]  [facility]  PLAN: The cholecystostomy tube will remain to gravity drainage during the hospitalization.  Agree with continue antibiotics.  Candidacy for future/interval cholecystectomy will be per general surgery.  If required, future cholecystostomy tube check and future tube exchanges may be performed after an initial time frame of 68 weeks.   Electronically Signed   By: Corrie Mckusick D.O.   On: 05/11/2014 16:45         Subjective: Pt is breathing better, but she has dyspnea with exertion.  Denies any f/c/ cp ,cough, hemoptysis.  Abd pain is improving.  She is tolerating her diet.  No diarrhea  Objective: Filed Vitals:   05/13/14 0730 05/13/14 1500 05/13/14 2151 05/14/14 0543  BP: 168/70 162/73 111/64 125/58  Pulse: 72 72 64 64  Temp:  98 F (36.7 C) 98.1 F (36.7 C) 97.5 F (36.4 C)  TempSrc:  Oral Oral Oral  Resp: 16 16 15 16   Height:      Weight:    80.9 kg (178 lb 5.6 oz)  SpO2:  92% 98%     Intake/Output Summary (Last 24 hours) at 05/14/14 1418 Last data filed at 05/14/14 0830  Gross per 24 hour  Intake      0 ml  Output   1705 ml  Net  -1705 ml   Weight change: -2.3 kg (-5 lb 1.1 oz) Exam:   General:  Pt is alert, follows commands appropriately, not in acute distress  HEENT: No icterus, No thrush,  [place]/AT  Cardiovascular: RRR, S1/S2, no rubs, no gallops  Respiratory: bibasilar crackles L>R, no wheeze  Abdomen: Soft/+BS, RUQ/epigastric tender without rebound, non  distended, no guarding  Extremities: 1+LE edema, No lymphangitis, No petechiae, No rashes, no synovitis  Data Reviewed: Basic Metabolic Panel:  Recent Labs Lab 05/10/14 0437 05/11/14 0614 05/12/14 0611 05/13/14 0450 05/14/14 0432   NA 138 140 140 138 141  K 3.2* 3.3* 3.8 4.2 4.5  CL 105 110 108 109 110  CO2 25 24 27 24 26   GLUCOSE 139* 125* 128* 122* 90  BUN 41* 38* 38* 38* 36*  CREATININE 2.59* 2.65* 2.99* 3.06* 3.28*  CALCIUM 8.0* 8.0* 8.1* 8.5 8.2*  MG  --   --   --   --  2.2   Liver Function Tests:  Recent Labs Lab 05/09/14 2043 05/10/14 0437  AST 15 13  ALT 7 <5  ALKPHOS 136* 126*  BILITOT 1.4* 1.1  PROT 5.9* 6.1  ALBUMIN 2.9* 2.5*    Recent Labs Lab 05/09/14 2043  LIPASE 32   No results for input(s): AMMONIA in the last 168 hours. CBC:  Recent Labs Lab 05/09/14 2043 05/10/14 0437 05/11/14 0614 05/12/14 0611 05/13/14 0450  WBC 14.8* 11.9* 8.2 7.5 6.4  NEUTROABS 12.2*  --   --   --   --   HGB 10.0* 9.4* 8.9* 8.7* 8.5*  HCT 31.4* 29.5* 28.6* 28.7* 27.6*  MCV 89.0 89.9 92.6 92.9 92.3  PLT 140* 128* 125* 135* 134*   Cardiac Enzymes: No results for input(s): CKTOTAL, CKMB, CKMBINDEX, TROPONINI in the last 168 hours. BNP: Invalid input(s): POCBNP CBG:  Recent Labs Lab 05/13/14 1131 05/13/14 1645 05/13/14 2154 05/14/14 0815 05/14/14 1157  GLUCAP 87 135* 137* 86 136*    Recent Results (from the past 240 hour(s))  Culture, routine-abscess     Status: None   Collection Time: 05/11/14  3:33 PM  Result Value Ref Range Status   Specimen Description ABSCESS GALL BLADDER  Final   Special Requests NONE  Final   Gram Stain   Final    MODERATE WBC PRESENT, PREDOMINANTLY PMN NO SQUAMOUS EPITHELIAL CELLS SEEN FEW GRAM VARIABLE ROD Performed at [facility]    Culture   Final    MODERATE CITROBACTER FREUNDII Performed at [facility]    Report Status 05/14/2014 FINAL  Final   Organism ID, Bacteria CITROBACTER FREUNDII  Final      Susceptibility   Citrobacter freundii - MIC*    CEFAZOLIN >=64 RESISTANT Resistant     CEFEPIME <=1 SENSITIVE Sensitive     CEFTAZIDIME >=64 RESISTANT Resistant     CEFTRIAXONE 32 INTERMEDIATE Intermediate     CIPROFLOXACIN <=0.25  SENSITIVE Sensitive     GENTAMICIN <=1 SENSITIVE Sensitive     IMIPENEM <=0.25 SENSITIVE Sensitive     PIP/TAZO 32 INTERMEDIATE Intermediate     TOBRAMYCIN <=1 SENSITIVE Sensitive     TRIMETH/SULFA <=20 SENSITIVE Sensitive     * MODERATE CITROBACTER FREUNDII     Scheduled Meds: . calcitRIOL  0.25 mcg Oral Daily  . carvedilol  25 mg Oral BID WC  . folic acid  1 mg Oral Daily  . furosemide  60 mg Intravenous BID  . heparin  5,000 Units Subcutaneous 3 times per day  . insulin aspart  0-15 Units Subcutaneous TID WC  . linagliptin  5 mg Oral Daily  . LORazepam  0.5 mg Oral BID  . mirtazapine  7.5 mg Oral QHS  . multivitamin with minerals  1 tablet Oral Daily  . piperacillin-tazobactam (ZOSYN)  IV  2.25 g Intravenous 4 times per day  . simvastatin  20 mg Oral QHS  . thiamine  100 mg Oral Daily   Continuous Infusions:    Gage Treiber, DO  [facility] Pager [phone]  If 7PM-7AM, please contact night-coverage [email or web address] Password [email or web address] 05/14/2014, 2:18 PM   LOS: 5 days

## 2014-05-14 NOTE — Consult Note (Signed)
CARDIOLOGY CONSULT NOTE  Patient ID: [name] MRN: [id number] DOB/AGE: November 05, 1925 81 y.o.  Admit date: 05/09/2014 Primary Physician FRIED, [name], MD  Reason for Consultation: Aortic Stenosis Primary Cardiologist: Fransico Him, MD  HPI: The patient is an 81 yr old woman with a PMH significant for chronic diastolic heart failure, aortic stenosis, chronic atrial fibrillation (not an anticoagulation candidate), essential hypertension, diabetes mellitus, colon cancer status post right hemicolectomy, chronic lower extremity edema, aortic root dilatation, and carotid artery stenosis. She was admitted to the hospital for treatment of cholecystitis, and interventional radiology placed a percutaneous cholecystostomy tube on 2/18. She also has CKD stage IV and Lasix was initially held (home dose 80 mg daily). She had been doing well and then complained of shortness of breath yesterday morning. She was not hypoxemic. CXR demonstrated new interstitial edema with b/l pleural effusions, R>L, with bilateral lower lobe opacities (atelectasis vs infiltrates). IV Lasix was started (2.1 L output) and an echocardiogram was obtained, which demonstrated vigorous LV systolic function, EF 24-09%, with elevated LV filling pressures, moderately elevated pulmonary pressures (60 mmHg), and now moderate to severe aortic stenosis. Mean gradient reported as 31 mmHg. Peak velocity not recorded. As creatinine had risen, Lasix was again held.  She is presently doing very well and is in good spirits. She denies chest pain, palpitations, leg swelling, and shortness of breath. At home, she says she was not experiencing chest pain, syncope, or shortness of breath. At her last visit with [name] earlier this month, an echocardiogram was ordered.     Allergies  Allergen Reactions  . Codeine Nausea Only  . Keflet [Cephalexin] Nausea Only  . Levaquin [Levofloxacin In D5w] Nausea Only  . Morphine And Related  Nausea Only  . Oxycodone Other (See Comments)    Abnormal behavior    Current Facility-Administered Medications  Medication Dose Route Frequency Provider Last Rate Last Dose  . calcitRIOL (ROCALTROL) capsule 0.25 mcg  0.25 mcg Oral Daily Allyne Gee, MD   0.25 mcg at 05/14/14 1113  . carvedilol (COREG) tablet 25 mg  25 mg Oral BID WC Allyne Gee, MD   25 mg at 05/14/14 0858  . ciprofloxacin (CIPRO) tablet 500 mg  500 mg Oral q1800 Orson Eva, MD      . folic acid (FOLVITE) tablet 1 mg  1 mg Oral Daily Allyne Gee, MD   1 mg at 05/14/14 1114  . heparin injection 5,000 Units  5,000 Units Subcutaneous 3 times per day Lavonia Drafts, PA-C   5,000 Units at 05/14/14 1513  . HYDROcodone-acetaminophen (NORCO/VICODIN) 5-325 MG per tablet 1 tablet  1 tablet Oral Q4H PRN Orson Eva, MD   1 tablet at 05/13/14 1800  . HYDROmorphone (DILAUDID) injection 0.5 mg  0.5 mg Intravenous Q4H PRN Allyne Gee, MD   0.5 mg at 05/12/14 0315  . insulin aspart (novoLOG) injection 0-15 Units  0-15 Units Subcutaneous TID WC Allyne Gee, MD   2 Units at 05/14/14 1236  . linagliptin (TRADJENTA) tablet 5 mg  5 mg Oral Daily Allyne Gee, MD   5 mg at 05/14/14 1114  . LORazepam (ATIVAN) tablet 0.5 mg  0.5 mg Oral BID Allyne Gee, MD   0.5 mg at 05/14/14 1114  . meclizine (ANTIVERT) tablet 12.5 mg  12.5 mg Oral PRN Allyne Gee, MD      . mirtazapine (REMERON) tablet 7.5 mg  7.5 mg Oral QHS  Allyne Gee, MD   7.5 mg at 05/13/14 2229  . multivitamin with minerals tablet 1 tablet  1 tablet Oral Daily Allyne Gee, MD   1 tablet at 05/14/14 1114  . ondansetron (ZOFRAN) tablet 4 mg  4 mg Oral Q6H PRN Allyne Gee, MD       Or  . ondansetron [facility]) injection 4 mg  4 mg Intravenous Q6H PRN Allyne Gee, MD   4 mg at 05/13/14 0743  . simvastatin (ZOCOR) tablet 20 mg  20 mg Oral QHS Allyne Gee, MD   20 mg at 05/13/14 2229  . thiamine (VITAMIN B-1) tablet 100 mg  100 mg Oral Daily Allyne Gee, MD   100 mg at  05/14/14 1114    Past Medical History  Diagnosis Date  . CKD (chronic kidney disease), stage IV     a. Baseline Cr reported 2.5-2.6.  Marland Kitchen Anxiety   . Diabetes mellitus     diabetic retinopathy  . Anemia   . Moderate aortic stenosis     a. By echo 10/2013.  Marland Kitchen Urinary tract infection   . Fall at home   . Colostomy care     colon ca - h/o bloody output from bag 12/2012 (ED visit)  . Colon cancer 30 year ago  . Chronic atrial fibrillation     a. not on anticoagulation due to increased risk of falls, advanced age.  . Chronic diastolic CHF (congestive heart failure)     a. Echo 10/2013: mild LVH, EF 60-65%, mod AS, mildly dilated LA, mod dilated RA, PASP 33.  Marland Kitchen Hypertension   . Carotid disease, bilateral     a. Carotid duplex 04/9922: 26-83% RICA/LICA.  Marland Kitchen H/O cardiovascular stress test     a. 2002: nuc negative for ischemia, EF 65%.    Past Surgical History  Procedure Laterality Date  . Abdominal surgery    . Abdominal hysterectomy    . Colon surgery    . Appendectomy    . Colostomy    . Cardioversion  09/30/2011    Procedure: CARDIOVERSION;  Surgeon: [name], MD;  Location: [place];  Service: Cardiovascular;  Laterality: N/A;  . I&d extremity  11/20/2011    Procedure: IRRIGATION AND DEBRIDEMENT EXTREMITY;  Surgeon: Alta Corning, MD;  Location: [place];  Service: Orthopedics;  Laterality: Left;  . Av fistula placement Left 06/02/2012    Procedure: ARTERIOVENOUS (AV) FISTULA CREATION;  Surgeon: Conrad [place], MD;  Location: [place];  Service: Vascular;  Laterality: Left;  Bascilic Fistula  . Bascilic vein transposition Left 08/18/2012    Procedure: BASCILIC VEIN TRANSPOSITION;  Surgeon: Conrad Alderton, MD;  Location: [place];  Service: Vascular;  Laterality: Left;  Left 2nd stage Basilic Vein Transposition     History   Social History  . Marital Status: Widowed    Spouse Name: N/A  . Number of Children: N/A  . Years of Education: N/A   Occupational History  . Not on file.    Social History Main Topics  . Smoking status: Never Smoker   . Smokeless tobacco: Current User    Types: Snuff  . Alcohol Use: No  . Drug Use: No  . Sexual Activity: No   Other Topics Concern  . Not on file   Social History Narrative     No family history of premature CAD in 1st degree relatives.  Prior to Admission medications   Medication Sig Start Date End  Date Taking? Authorizing Provider  acetaminophen (TYLENOL) 500 MG tablet Take 500 mg by mouth every 6 (six) hours as needed for pain.    Yes Historical Provider, MD  aspirin EC 81 MG tablet Take 81 mg by mouth daily.   Yes Historical Provider, MD  calcitRIOL (ROCALTROL) 0.25 MCG capsule Take 0.25 mcg by mouth daily.  04/06/12  Yes Historical Provider, MD  carvedilol (COREG) 25 MG tablet TAKE 1 TABLET (25 MG TOTAL) BY MOUTH 2 (TWO) TIMES DAILY WITH A MEAL. 03/13/14  Yes [name], MD  docusate sodium (COLACE) 100 MG capsule Take 100 mg by mouth daily as needed for constipation.    Yes Historical Provider, MD  furosemide (LASIX) 80 MG tablet Take 80 mg by mouth daily. 04/10/14  Yes Historical Provider, MD  insulin glargine (LANTUS) 100 UNIT/ML injection Inject 8 Units into the skin at bedtime as needed (if cbg over 150).    Yes Historical Provider, MD  linagliptin (TRADJENTA) 5 MG TABS tablet Take 5 mg by mouth daily.    Yes Historical Provider, MD  LORazepam (ATIVAN) 0.5 MG tablet Take 0.5 mg by mouth 2 (two) times daily.   Yes Historical Provider, MD  meclizine (ANTIVERT) 12.5 MG tablet Take 12.5 mg by mouth as needed for dizziness.  11/23/12  Yes Historical Provider, MD  mirtazapine (REMERON) 7.5 MG tablet Take 7.5 mg by mouth at bedtime.  09/28/12  Yes Historical Provider, MD  promethazine (PHENERGAN) 12.5 MG tablet Take 1 tablet (12.5 mg total) by mouth every 6 (six) hours as needed for nausea or vomiting. 03/14/14  Yes Ripudeep Krystal Eaton, MD  simvastatin (ZOCOR) 20 MG tablet Take 1 tablet (20 mg total) by mouth at bedtime.  04/28/14  Yes [name], MD  vitamin B-12 (CYANOCOBALAMIN) 1000 MCG tablet Take 1,000 mcg by mouth daily.   Yes Historical Provider, MD     Review of systems complete and found to be negative unless listed above in HPI     Physical exam Blood pressure 125/58, pulse 64, temperature 97.5 F (36.4 C), temperature source Oral, resp. rate 16, height 5\' 3"  (1.6 m), weight 178 lb 5.6 oz (80.9 kg), SpO2 98 %. General: NAD Neck: No JVD, no thyromegaly or thyroid nodule.  Lungs: Faint bibasilar crackles with diminished sounds at bases. CV: Nondisplaced PMI. Regular rate and rhythm, normal S1/S2, no S3/S4, III/VI harsh ejection systolic murmur best heard at RUSB but appreciated throughout precordium. No pulsus parvus et tardus.  Trivial pretibial/periankle edema.  Abdomen: Soft, percutaneous cholecystostomy drain visualized.  Skin: Intact without lesions or rashes.  Neurologic: Alert and oriented x 3.  Psych: Normal affect. Extremities: No clubbing or cyanosis.  HEENT: Normal.   ECG: Most recent ECG reviewed.  Labs:   Lab Results  Component Value Date   WBC 6.4 05/13/2014   HGB 8.5* 05/13/2014   HCT 27.6* 05/13/2014   MCV 92.3 05/13/2014   PLT 134* 05/13/2014    Recent Labs Lab 05/10/14 0437  05/14/14 0432  NA 138  < > 141  K 3.2*  < > 4.5  CL 105  < > 110  CO2 25  < > 26  BUN 41*  < > 36*  CREATININE 2.59*  < > 3.28*  CALCIUM 8.0*  < > 8.2*  PROT 6.1  --   --   BILITOT 1.1  --   --   ALKPHOS 126*  --   --   ALT <5  --   --  AST 13  --   --   GLUCOSE 139*  < > 90  < > = values in this interval not displayed. Lab Results  Component Value Date   TROPONINI <0.30 05/16/2012   No results found for: CHOL No results found for: HDL No results found for: LDLCALC No results found for: TRIG No results found for: CHOLHDL No results found for: LDLDIRECT       Studies: Dg Chest 2 View  05/13/2014   CLINICAL DATA:  Dyspnea and productive cough. Acute cholecystitis and  status post percutaneous cholecystostomy tube placement.  EXAM: CHEST - 2 VIEW  COMPARISON:  04/17/2014 study at [place] at [facility].  FINDINGS: There is development of new interstitial edema with associated bilateral pleural effusions, right greater than left. Bilateral lower lobe opacities likely represent atelectasis. However, component of bibasilar pneumonia cannot be excluded. Opacity at the right lung base is more prominent than at the left. No pneumothorax. The heart size is normal.  IMPRESSION: New interstitial edema with bilateral pleural effusions, right greater than left. Bilateral lower lobe opacities, right greater than left, representing atelectasis and/or infiltrates.   Electronically Signed   By: Aletta Edouard M.D.   On: 05/13/2014 09:22    ASSESSMENT AND PLAN:  1. Moderate to severe aortic stenosis: While she decompensated yesterday, her outpatient Lasix was held due to worsening renal function. She has faint bibasilar rales. I will resume Lasix at 40 mg daily.     At home, she has been relatively stable without chest pain, shortness of breath, and presyncope/syncope. As mentioned above, echocardiogram showed vigorous LV systolic function, EF 57-32%, with elevated LV filling pressures, moderately elevated pulmonary pressures (60 mmHg),    mean gradient reported as 31 mmHg. Peak velocity not recorded.  On physical exam, S2 still appreciated. No pulsus parvus et tardus. Thus, clinically also appears to be moderate to severe and not severe. At this time, intervention is not required. I do not think she would be a suitable candidate for surgical aortic valve replacement regardless. Would recommend repeating a transthoracic echocardiogram in 6 months and following her clinically every 3-6 months, which I will defer to [name].  2. Acute on chronic diastolic heart failure: Likely exacerbated by withholding Lasix. Has had 2.1 L output. I will resume Lasix at 40 mg daily. Continued monitoring  of renal function will be warranted. Continue Coreg at present dose.  3. Chronic atrial fibrillation: Not an anticoagulation candidate. Continue Coreg and resume ASA when ok with surgery.  4. Essential HTN: Well controlled. No changes.  5. Aortic root dilatation: Normal by most recent echo.  6. Carotid artery stenosis: Due for repeat Dopplers, already ordered by [name].  7. Hyperlipidemia: LDL noted to be 93, for which simvastatin was started by [name].  Dispo: Pt will f/u with [name] regarding management of aortic stenosis as outpatient. No further recommendations at this time.    Signed: Kate Sable, M.D., F.A.C.C.  05/14/2014, 3:25 PM

## 2014-05-15 LAB — GLUCOSE, CAPILLARY
Glucose-Capillary: 112 mg/dL — ABNORMAL HIGH (ref 70–99)
Glucose-Capillary: 141 mg/dL — ABNORMAL HIGH (ref 70–99)
Glucose-Capillary: 140 mg/dL — ABNORMAL HIGH (ref 70–99)
Glucose-Capillary: 143 mg/dL — ABNORMAL HIGH (ref 70–99)

## 2014-05-15 LAB — BASIC METABOLIC PANEL
Anion gap: 8 (ref 5–15)
BUN: 35 mg/dL — ABNORMAL HIGH (ref 6–23)
CHLORIDE: 108 mmol/L (ref 96–112)
CO2: 26 mmol/L (ref 19–32)
CREATININE: 3.2 mg/dL — AB (ref 0.50–1.10)
Calcium: 8.6 mg/dL (ref 8.4–10.5)
GFR calc Af Amer: 14 mL/min — ABNORMAL LOW (ref >90)
GFR, EST NON AFRICAN AMERICAN: 12 mL/min — AB (ref >90)
GLUCOSE: 115 mg/dL — AB (ref 70–99)
Potassium: 4.3 mmol/L (ref 3.5–5.1)
Sodium: 142 mmol/L (ref 135–145)

## 2014-05-15 LAB — HEPATIC FUNCTION PANEL
ALT: 7 U/L (ref 0–35)
AST: 15 U/L (ref 0–37)
Albumin: 2.4 g/dL — ABNORMAL LOW (ref 3.5–5.2)
Alkaline Phosphatase: 103 U/L (ref 39–117)
BILIRUBIN TOTAL: 0.5 mg/dL (ref 0.3–1.2)
Bilirubin, Direct: 0.1 mg/dL (ref 0.0–0.5)
Indirect Bilirubin: 0.4 mg/dL (ref 0.3–0.9)
Total Protein: 5.7 g/dL — ABNORMAL LOW (ref 6.0–8.3)

## 2014-05-15 MED ORDER — FUROSEMIDE 10 MG/ML IJ SOLN
20.0000 mg | Freq: Once | INTRAMUSCULAR | Status: AC
  Administered 2014-05-15: 20 mg via INTRAVENOUS
  Filled 2014-05-15: qty 2

## 2014-05-15 NOTE — Progress Notes (Addendum)
PROGRESS NOTE  [name] QQI:[id number] DOB: 1925/08/13 DOA: 05/09/2014 PCP: Abigail Miyamoto, MD  Assessment/Plan: Acalculous Acute cholecystitis - appreciate surgery followup -2/18-- perc cholecystotomy tube -2/18--GB drainage culture= Citrobacter -d/c zosyn -start Cipro Chronic atrial fibrillation -Rate controlled -Continue carvedilol  -not AC candidate due to falls -CHADS-VASc= 5 -restart ASA if ok with surgery Acute on Chronic diastolic CHF -9/41--DE developed dyspnea -2/20 CXR--pulm edema and b/l pleural effusions -saline lock IVF -start IV Lasix 60mg  IV q 12 hr--hold as serum creatinine going up with 3 doses -05/13/14 echo--EF 65-70%, mod-severe AS (increased valve gradient compared to 10/26/13) -dailyt weights--question accuracy -Diuresed 3.1 L since starting IV furosemide -appreciate cardiology consult-->continue po furosemide -still appears mild fluid overloaded today-->give one time dose IV lasix Persistent nausea -This has been a chronic issue--multifactorial including acalculous cholecystitis, antibiotics, chronic renal failure, -Treated symptomatically Deconditioning -PT evaluation--> recommended home health PT Diabetes mellitus, type II - lantus on hold -cont sliding scale and Tradjenta -05/10/2014 hemoglobin A1c 6.8 -CBGs remain controlled Hypertension - cont Coreg -improvement with diuresis CKD (chronic kidney disease), stage IV - Baseline creatinine 2.3-2.6 -monitored closely on IV lasix Hypokalemia - replace and recheck -Check magnesium--2.2 History of colon cancer 2002 -Status post right hemicolectomy  Family Communication: daughter updated at beside Disposition Plan: Home when medically stable        Procedures/Studies: Dg Chest 2 View  05/13/2014   CLINICAL DATA:  Dyspnea and productive cough. Acute cholecystitis and status post percutaneous cholecystostomy tube placement.  EXAM: CHEST - 2 VIEW  COMPARISON:   04/17/2014 study at [place] at [facility].  FINDINGS: There is development of new interstitial edema with associated bilateral pleural effusions, right greater than left. Bilateral lower lobe opacities likely represent atelectasis. However, component of bibasilar pneumonia cannot be excluded. Opacity at the right lung base is more prominent than at the left. No pneumothorax. The heart size is normal.  IMPRESSION: New interstitial edema with bilateral pleural effusions, right greater than left. Bilateral lower lobe opacities, right greater than left, representing atelectasis and/or infiltrates.   Electronically Signed   By: Aletta Edouard M.D.   On: 05/13/2014 09:22   US Abdomen Complete  05/09/2014   CLINICAL DATA:  Right upper quadrant pain.  EXAM: ULTRASOUND ABDOMEN COMPLETE  COMPARISON:  None.  FINDINGS: Gallbladder: Gallbladder is enlarged with sludge. Gallbladder wall thickening to 5.2 mm diffuse positive Murphy sign. These findings consistent with cholecystitis.  Common bile duct: Diameter: 2.4 mm  Liver: No focal lesion identified. Within normal limits in parenchymal echogenicity.  IVC: No abnormality visualized.  Pancreas: Not visualized due to overlying bowel gas.  Spleen: Size and appearance within normal limits.  Right Kidney: Length: 9.2 cm. Diffuse renal cortical thinning. No mass or hydronephrosis.  Left Kidney:  Obscured by bowel gas.  Abdominal aorta: No aneurysm visualized.  Other findings: None.  IMPRESSION: Enlarged gallbladder with sludge. Gallbladder wall thickening and positive Murphy's sign. These findings are consistent with cholecystitis.   Electronically Signed   By: Marcello Moores  Register   On: 05/09/2014 15:32   Ir Perc Cholecystostomy  05/11/2014   INDICATION: 81 year old female with a history of acute cholecystitis.  The patient is a poor surgical candidate from the standpoint of a cholecystectomy given that she has extensive cardiac comorbidities. She has been referred for a percutaneous  cholecystostomy tube.  EXAM: ULTRASOUND AND FLUOROSCOPIC-GUIDED CHOLECYSTOSTOMY TUBE PLACEMENT  COMPARISON:  None.  MEDICATIONS: Fentanyl 50 mcg IV; Versed  0.5 mg IV; The patient is currently admitted to the hospital and on intravenous antibiotics. Antibiotics were administered within an appropriate time frame prior to skin puncture.  ANESTHESIA/SEDATION: Total Moderate Sedation Time  Twenty minutes  CONTRAST:  Intraluminal contrast  FLUOROSCOPY TIME:  48 seconds  COMPLICATIONS: None immediate.  PROCEDURE: Informed written consent was obtained from the patient and the patient's family after a discussion of the risks, benefits and alternatives to treatment. Questions regarding the procedure were encouraged and answered, including the potential for a permanent tube draining the gallbladder if the patient is not a surgical candidate. A timeout was performed prior to the initiation of the procedure.  The right upper abdominal quadrant was prepped and draped in the usual sterile fashion, and a sterile drape was applied covering the operative field. Maximum barrier sterile technique with sterile gowns and gloves were used for the procedure. A timeout was performed prior to the initiation of the procedure. Local anesthesia was provided with 1% lidocaine with epinephrine.  Ultrasound scanning of the right upper quadrant demonstrates a markedly dilated gallbladder. Of note, the patient reported pain with ultrasound imaging over the gallbladder. Utilizing a transhepatic approach, a 22 gauge needle was advanced into the gallbladder under direct ultrasound guidance. An ultrasound image was saved for documentation purposes. Appropriate intraluminal puncture was confirmed with the efflux of bile and advancement of an 0.018 wire into the gallbladder lumen. The needle was exchanged for an [place] set. A small amount of contrast was injected to confirm appropriate intraluminal positioning. Over a Benson wire, a 45.2-French Cook  cholecystomy tube was advanced into the gallbladder fossa, coiled and locked. Bile was aspirated and a small amount of contrast was injected as several post procedural spot radiographic images were obtained in various obliquities. The catheter was secured to the skin with suture, connected to a drainage bag and a dressing was placed. The patient tolerated the procedure well without immediate post procedural complication.  IMPRESSION: Status post placement of percutaneous cholecystostomy tube to gravity drainage.  Signed,  Dulcy Fanny. Earleen Newport DO  [facility]  [facility]  PLAN: The cholecystostomy tube will remain to gravity drainage during the hospitalization.  Agree with continue antibiotics.  Candidacy for future/interval cholecystectomy will be per general surgery.  If required, future cholecystostomy tube check and future tube exchanges may be performed after an initial time frame of 68 weeks.   Electronically Signed   By: Corrie Mckusick D.O.   On: 05/11/2014 16:45         Subjective: Patient complains of some shortness of breath and nausea. Denies any fevers, chills, chest pain, coughing, hemoptysis. She has some right upper quadrant pain. No diarrhea, hematochezia, melena.  Objective: Filed Vitals:   05/14/14 1430 05/14/14 2124 05/15/14 0625 05/15/14 1428  BP: 130/70 154/62 158/68   Pulse: 62 71 73   Temp: 97.3 F (36.3 C) 97.9 F (36.6 C) 97.8 F (36.6 C)   TempSrc: Oral Oral Oral   Resp: 16 16 17    Height:      Weight:   82.2 kg (181 lb 3.5 oz)   SpO2: 98% 91% 98% 93%    Intake/Output Summary (Last 24 hours) at 05/15/14 1448 Last data filed at 05/15/14 1300  Gross per 24 hour  Intake    720 ml  Output   1705 ml  Net   -985 ml   Weight change: 1.3 kg (2 lb 13.9 oz) Exam:   General:  Pt is alert,  follows commands appropriately, not in acute distress  HEENT: No icterus, No thrush,  [place]/AT  Cardiovascular: IRRR, S1/S2, no rubs,  no gallops  Respiratory: Bibasilar crackles. No wheeze  Abdomen: Soft/+BS, non tender, non distended, no guarding  Extremities: 1+LE edema, No lymphangitis, No petechiae, No rashes, no synovitis  Data Reviewed: Basic Metabolic Panel:  Recent Labs Lab 05/11/14 0614 05/12/14 0611 05/13/14 0450 05/14/14 0432 05/15/14 0535  NA 140 140 138 141 142  K 3.3* 3.8 4.2 4.5 4.3  CL 110 108 109 110 108  CO2 24 27 24 26 26   GLUCOSE 125* 128* 122* 90 115*  BUN 38* 38* 38* 36* 35*  CREATININE 2.65* 2.99* 3.06* 3.28* 3.20*  CALCIUM 8.0* 8.1* 8.5 8.2* 8.6  MG  --   --   --  2.2  --    Liver Function Tests:  Recent Labs Lab 05/09/14 2043 05/10/14 0437 05/15/14 0535  AST 15 13 15   ALT 7 <5 7  ALKPHOS 136* 126* 103  BILITOT 1.4* 1.1 0.5  PROT 5.9* 6.1 5.7*  ALBUMIN 2.9* 2.5* 2.4*    Recent Labs Lab 05/09/14 2043  LIPASE 32   No results for input(s): AMMONIA in the last 168 hours. CBC:  Recent Labs Lab 05/09/14 2043 05/10/14 0437 05/11/14 0614 05/12/14 0611 05/13/14 0450  WBC 14.8* 11.9* 8.2 7.5 6.4  NEUTROABS 12.2*  --   --   --   --   HGB 10.0* 9.4* 8.9* 8.7* 8.5*  HCT 31.4* 29.5* 28.6* 28.7* 27.6*  MCV 89.0 89.9 92.6 92.9 92.3  PLT 140* 128* 125* 135* 134*   Cardiac Enzymes: No results for input(s): CKTOTAL, CKMB, CKMBINDEX, TROPONINI in the last 168 hours. BNP: Invalid input(s): POCBNP CBG:  Recent Labs Lab 05/14/14 1157 05/14/14 1714 05/14/14 2123 05/15/14 0800 05/15/14 1255  GLUCAP 136* 95 128* 112* 141*    Recent Results (from the past 240 hour(s))  Culture, routine-abscess     Status: None   Collection Time: 05/11/14  3:33 PM  Result Value Ref Range Status   Specimen Description ABSCESS GALL BLADDER  Final   Special Requests NONE  Final   Gram Stain   Final    MODERATE WBC PRESENT, PREDOMINANTLY PMN NO SQUAMOUS EPITHELIAL CELLS SEEN FEW GRAM VARIABLE ROD Performed at [facility]    Culture   Final    MODERATE CITROBACTER  FREUNDII Performed at [facility]    Report Status 05/14/2014 FINAL  Final   Organism ID, Bacteria CITROBACTER FREUNDII  Final      Susceptibility   Citrobacter freundii - MIC*    CEFAZOLIN >=64 RESISTANT Resistant     CEFEPIME <=1 SENSITIVE Sensitive     CEFTAZIDIME >=64 RESISTANT Resistant     CEFTRIAXONE 32 INTERMEDIATE Intermediate     CIPROFLOXACIN <=0.25 SENSITIVE Sensitive     GENTAMICIN <=1 SENSITIVE Sensitive     IMIPENEM <=0.25 SENSITIVE Sensitive     PIP/TAZO 32 INTERMEDIATE Intermediate     TOBRAMYCIN <=1 SENSITIVE Sensitive     TRIMETH/SULFA <=20 SENSITIVE Sensitive     * MODERATE CITROBACTER FREUNDII     Scheduled Meds: . calcitRIOL  0.25 mcg Oral Daily  . carvedilol  25 mg Oral BID WC  . ciprofloxacin  500 mg Oral q1800  . folic acid  1 mg Oral Daily  . furosemide  20 mg Intravenous Once  . furosemide  40 mg Oral Daily  . heparin  5,000 Units Subcutaneous 3 times per day  .  insulin aspart  0-15 Units Subcutaneous TID WC  . linagliptin  5 mg Oral Daily  . LORazepam  0.5 mg Oral BID  . mirtazapine  7.5 mg Oral QHS  . multivitamin with minerals  1 tablet Oral Daily  . simvastatin  20 mg Oral QHS  . thiamine  100 mg Oral Daily   Continuous Infusions:    Allyssa Abruzzese, DO  [facility] Pager [phone]  If 7PM-7AM, please contact night-coverage [email or web address] Password [email or web address] 05/15/2014, 2:48 PM   LOS: 6 days

## 2014-05-15 NOTE — Progress Notes (Signed)
Physical Therapy Treatment Patient Details Name: [name] MRN: [id number] DOB: 14-Apr-1925 Today's Date: 06/12/14    History of Present Illness [name] is a 81 y.o. female presents with RUQ pain. pt with cholecystitis s/p perc chole 2/18. PMHx of DM, retinopathy, colon CA with colostomy, Afib, CHF    PT Comments    [name] is progressing with gait and transfers but declined HEp today due to wanting to get a bath. Pt with sats 98% on 2L on arrival and maintained on RA. With gait sats 93% on RA with cues for breathing technique. Pt denied further activity at this time and encouraged Bil LE HEp throughout day, additional ambulation with staff and continued mobility. Will continue to follow.   Follow Up Recommendations  Home health PT;Supervision for mobility/OOB     Equipment Recommendations  None recommended by PT    Recommendations for Other Services       Precautions / Restrictions Precautions Precautions: Fall Precaution Comments: perc chole drain    Mobility  Bed Mobility               General bed mobility comments: in chair on arrival and end of session  Transfers Overall transfer level: Needs assistance   Transfers: Sit to/from Stand Sit to Stand: Supervision         General transfer comment: cues for hand placement and sequence and to control descent  Ambulation/Gait Ambulation/Gait assistance: Min guard Ambulation Distance (Feet): 160 Feet Assistive device: Rolling walker (2 wheeled) Gait Pattern/deviations: Step-through pattern;Trunk flexed;Decreased stride length   Gait velocity interpretation: Below normal speed for age/gender General Gait Details: min cues for posture and position in RW   Stairs            Wheelchair Mobility    Modified Rankin (Stroke Patients Only)       Balance     Sitting balance-Leahy Scale: Good       Standing balance-Leahy Scale: Fair                      Cognition  Arousal/Alertness: Awake/alert Behavior During Therapy: WFL for tasks assessed/performed Overall Cognitive Status: Within Functional Limits for tasks assessed                      Exercises      General Comments        Pertinent Vitals/Pain Pain Assessment: No/denies pain    Home Living                      Prior Function            PT Goals (current goals can now be found in the care plan section) Progress towards PT goals: Progressing toward goals    Frequency       PT Plan Current plan remains appropriate    Co-evaluation             End of Session   Activity Tolerance: Patient limited by fatigue Patient left: in chair;with call bell/phone within reach;with nursing/sitter in room;with family/visitor present     Time: 5361-4431 PT Time Calculation (min) (ACUTE ONLY): 16 min  Charges:  $Gait Training: 8-22 mins                    G Codes:      Melford Aase 2014/06/12, 2:30 PM Elwyn Reach, [place]

## 2014-05-15 NOTE — Progress Notes (Signed)
[facility] Progress Note     Subjective: Pt feels sick this am.  She says she's having trouble breathing and that's why she feels bad.  She notes she nauseas, coughing up phlegm, dry heaving.  She says she can't eat much.  Had some [place] steak and gravy yesterday for lunch, but nothing for dinner.  Doesn't have an appetite.  Denies any pain in her abdomen.  She's frustrated and wants to go home.  IS only to 500.  Objective: Vital signs in last 24 hours: Temp:  [97.3 F (36.3 C)-97.9 F (36.6 C)] 97.8 F (36.6 C) (02/22 0625) Pulse Rate:  [62-73] 73 (02/22 0625) Resp:  [16-17] 17 (02/22 0625) BP: (130-158)/(62-70) 158/68 mmHg (02/22 0625) SpO2:  [91 %-98 %] 98 % (02/22 0625) Weight:  [181 lb 3.5 oz (82.2 kg)] 181 lb 3.5 oz (82.2 kg) (02/22 0625) Last BM Date: 05/13/14  Intake/Output from previous day: 02/21 0701 - 02/22 0700 In: 70 [IV Piggyback:65] Out: 4315 [Urine:1575; Drains:180] Intake/Output this shift:    PE: Gen:  Alert, NAD, pleasant Abd: Soft, NT/ND, +BS, no HSM, incisions C/D/I, IR perc chole tube with bilious output (135mL/24hr)   Lab Results:   Recent Labs  05/13/14 0450  WBC 6.4  HGB 8.5*  HCT 27.6*  PLT 134*   BMET  Recent Labs  05/14/14 0432 05/15/14 0535  NA 141 142  K 4.5 4.3  CL 110 108  CO2 26 26  GLUCOSE 90 115*  BUN 36* 35*  CREATININE 3.28* 3.20*  CALCIUM 8.2* 8.6   PT/INR No results for input(s): LABPROT, INR in the last 72 hours. CMP     Component Value Date/Time   NA 142 05/15/2014 0535   K 4.3 05/15/2014 0535   CL 108 05/15/2014 0535   CO2 26 05/15/2014 0535   GLUCOSE 115* 05/15/2014 0535   BUN 35* 05/15/2014 0535   CREATININE 3.20* 05/15/2014 0535   CALCIUM 8.6 05/15/2014 0535   PROT 6.1 05/10/2014 0437   ALBUMIN 2.5* 05/10/2014 0437   AST 13 05/10/2014 0437   ALT <5 05/10/2014 0437   ALKPHOS 126* 05/10/2014 0437   BILITOT 1.1 05/10/2014 0437   GFRNONAA 12* 05/15/2014 0535   GFRAA 14* 05/15/2014  0535   Lipase     Component Value Date/Time   LIPASE 32 05/09/2014 2043       Studies/Results: Dg Chest 2 View  05/13/2014   CLINICAL DATA:  Dyspnea and productive cough. Acute cholecystitis and status post percutaneous cholecystostomy tube placement.  EXAM: CHEST - 2 VIEW  COMPARISON:  04/17/2014 study at [place] at [facility].  FINDINGS: There is development of new interstitial edema with associated bilateral pleural effusions, right greater than left. Bilateral lower lobe opacities likely represent atelectasis. However, component of bibasilar pneumonia cannot be excluded. Opacity at the right lung base is more prominent than at the left. No pneumothorax. The heart size is normal.  IMPRESSION: New interstitial edema with bilateral pleural effusions, right greater than left. Bilateral lower lobe opacities, right greater than left, representing atelectasis and/or infiltrates.   Electronically Signed   By: Aletta Edouard M.D.   On: 05/13/2014 09:22    Anti-infectives: Anti-infectives    Start     Dose/Rate Route Frequency Ordered Stop   05/14/14 1800  ciprofloxacin (CIPRO) tablet 500 mg     500 mg Oral Daily-1800 05/14/14 1433     05/11/14 0800  piperacillin-tazobactam (ZOSYN) IVPB 2.25 g  Status:  Discontinued  2.25 g 100 mL/hr over 30 Minutes Intravenous 4 times per day 05/11/14 1975 05/14/14 1433       Assessment/Plan Acute cholecystitis SOB/Dyspnea H/o rectal cancer underwent APR (colostomy) 30-35 years ago Repair of parastomal hernia 1992 2nd colon cancer - 2002 right colectomy repair of peristomal hernia with extensive LOA  Plan: 1.  Says she's SOB and that's why she's feeling bad.  She's nauseated and spitting up phlegm and burping.  Colostomy working well. 2.  Continue perc chole tube at discharge.  Will need this for at least 6-8 weeks.  Oral abx course of 7 days for gb 3.  Ambulate and IS 4.  SCD's and heparin 5.  She should return to see [place] for consideration of  cholecystectomy/drain study, but may end up being nonoperative candidate.  F/u with Dr. Georgette Dover 6.  Culture growing Citrobacter - started on cipro 7.  Check LFT's since c/o nausea/dry heaving    LOS: 6 days    [name], [name] 05/15/2014, 8:00 AM Pager: [phone]

## 2014-05-16 LAB — BASIC METABOLIC PANEL
Anion gap: 4 — ABNORMAL LOW (ref 5–15)
BUN: 34 mg/dL — AB (ref 6–23)
CHLORIDE: 110 mmol/L (ref 96–112)
CO2: 27 mmol/L (ref 19–32)
Calcium: 8.5 mg/dL (ref 8.4–10.5)
Creatinine, Ser: 3 mg/dL — ABNORMAL HIGH (ref 0.50–1.10)
GFR calc Af Amer: 15 mL/min — ABNORMAL LOW (ref >90)
GFR calc non Af Amer: 13 mL/min — ABNORMAL LOW (ref >90)
GLUCOSE: 117 mg/dL — AB (ref 70–99)
POTASSIUM: 4.7 mmol/L (ref 3.5–5.1)
Sodium: 141 mmol/L (ref 135–145)

## 2014-05-16 LAB — GLUCOSE, CAPILLARY: Glucose-Capillary: 123 mg/dL — ABNORMAL HIGH (ref 70–99)

## 2014-05-16 MED ORDER — FUROSEMIDE 40 MG PO TABS
40.0000 mg | ORAL_TABLET | Freq: Once | ORAL | Status: AC
  Administered 2014-05-16: 40 mg via ORAL
  Filled 2014-05-16: qty 1

## 2014-05-16 MED ORDER — CIPROFLOXACIN HCL 500 MG PO TABS: 500.0000 mg | ORAL_TABLET | Freq: Every day | ORAL | Status: DC

## 2014-05-16 MED ORDER — HYDROCODONE-ACETAMINOPHEN 5-325 MG PO TABS: 1.0000 | ORAL_TABLET | ORAL | Status: DC | PRN

## 2014-05-16 MED ORDER — BISACODYL 10 MG RE SUPP: 10.0000 mg | Freq: Every day | RECTAL | Status: DC | PRN

## 2014-05-16 MED ORDER — DOCUSATE SODIUM 100 MG PO CAPS
100.0000 mg | ORAL_CAPSULE | Freq: Two times a day (BID) | ORAL | Status: DC
  Administered 2014-05-16: 100 mg via ORAL
  Filled 2014-05-16: qty 1

## 2014-05-16 NOTE — Progress Notes (Signed)
Discharge home. Home discharge instruction given to daughter, no questions verbalized.Home health needs addressed by the case management.

## 2014-05-16 NOTE — Progress Notes (Signed)
[facility] Progress Note     Subjective: Pt feels much better, no nausea/burping/dry heaving today.  Tolerating diet, appetite improving.  Good BM's in ostomy.  No abdominal pain.  Drain with good output 239mL/24hr.  Objective: Vital signs in last 24 hours: Temp:  [97.8 F (36.6 C)-98.6 F (37 C)] 98.6 F (37 C) (02/23 0550) Pulse Rate:  [62-76] 76 (02/23 0550) Resp:  [16-17] 17 (02/23 0550) BP: (142-165)/(61-79) 165/79 mmHg (02/23 0550) SpO2:  [90 %-93 %] 90 % (02/23 0550) Weight:  [179 lb 14.3 oz (81.6 kg)] 179 lb 14.3 oz (81.6 kg) (02/23 0500) Last BM Date: 05/15/14  Intake/Output from previous day: 02/22 0701 - 02/23 0700 In: 1200 [P.O.:1200] Out: 1900 [Urine:1700; Drains:200] Intake/Output this shift:    PE: Gen:  Alert, NAD, pleasant Abd: Soft, NT/ND, +BS, no HSM, incisions C/D/I, IR perc chole tube with bilious output (244mL/24hr)   Lab Results:  No results for input(s): WBC, HGB, HCT, PLT in the last 72 hours. BMET  Recent Labs  05/15/14 0535 05/16/14 0528  NA 142 141  K 4.3 4.7  CL 108 110  CO2 26 27  GLUCOSE 115* 117*  BUN 35* 34*  CREATININE 3.20* 3.00*  CALCIUM 8.6 8.5   PT/INR No results for input(s): LABPROT, INR in the last 72 hours. CMP     Component Value Date/Time   NA 141 05/16/2014 0528   K 4.7 05/16/2014 0528   CL 110 05/16/2014 0528   CO2 27 05/16/2014 0528   GLUCOSE 117* 05/16/2014 0528   BUN 34* 05/16/2014 0528   CREATININE 3.00* 05/16/2014 0528   CALCIUM 8.5 05/16/2014 0528   PROT 5.7* 05/15/2014 0535   ALBUMIN 2.4* 05/15/2014 0535   AST 15 05/15/2014 0535   ALT 7 05/15/2014 0535   ALKPHOS 103 05/15/2014 0535   BILITOT 0.5 05/15/2014 0535   GFRNONAA 13* 05/16/2014 0528   GFRAA 15* 05/16/2014 0528   Lipase     Component Value Date/Time   LIPASE 32 05/09/2014 2043       Studies/Results: No results found.  Anti-infectives: Anti-infectives    Start     Dose/Rate Route Frequency Ordered Stop   05/14/14 1800  ciprofloxacin (CIPRO) tablet 500 mg     500 mg Oral Daily-1800 05/14/14 1433     05/11/14 0800  piperacillin-tazobactam (ZOSYN) IVPB 2.25 g  Status:  Discontinued     2.25 g 100 mL/hr over 30 Minutes Intravenous 4 times per day 05/11/14 3810 05/14/14 1433       Assessment/Plan Acute cholecystitis SOB/Dyspnea H/o rectal cancer underwent APR (colostomy) 30-35 years ago Repair of parastomal hernia 1992 2nd colon cancer - 2002 right colectomy repair of peristomal hernia with extensive LOA  Plan: 1. No more SOB, nausea.  Doing much better.  LFT's are normal and perc chole tube with good output, no pain. Colostomy working well. 2. Continue perc chole tube at discharge. Will need this for at least 6-8 weeks. Oral abx course of 7 days for gb 3. Ambulate and IS 4. SCD's and heparin 5. She should return to see [place] for consideration of cholecystectomy/drain study, but may end up being nonoperative candidate. F/u with Dr. Georgette Dover 6. Culture growing Citrobacter - started on cipro 7.  D/c today per medicine team     LOS: 7 days    DORT, [facility] 05/16/2014, 7:41 AM Pager: [phone]

## 2014-05-16 NOTE — Discharge Summary (Signed)
Physician Discharge Summary  [name] VEH:[id number] DOB: 09/15/1925 DOA: 05/09/2014  PCP: Abigail Miyamoto, MD  Admit date: 05/09/2014 Discharge date: 05/16/2014  Recommendations for Outpatient Follow-up:  1. Pt will need to follow up with PCP in 1 weeks post discharge 2. Please obtain BMP in 1 week 3. Follow up general surgery--Dr. Grandville Silos in 3-4 weeks  Discharge Diagnoses:  Acalculous Acute cholecystitis - appreciate surgery followup -2/18-- perc cholecystotomy tube -2/18--GB drainage culture= Citrobacter -d/c zosyn -start Cipro--home with 7 additional days of therapy, 500mg  po daily Chronic atrial fibrillation -Rate controlled -Continue carvedilol  -not AC candidate due to falls -CHADS-VASc= 5 -restart ASA 81 mg daily Acute on Chronic diastolic CHF -8/36--OQ developed dyspnea -2/20 CXR--pulm edema and b/l pleural effusions -saline lock IVF -start IV Lasix 60mg  IV q 12 hr--hold as serum creatinine going up with 3 doses -05/13/14 echo--EF 65-70%, mod-severe AS (increased valve gradient compared to 10/26/13) -dailyt weights--question accuracy -Diuresed 5 L since starting IV furosemide -appreciate cardiology consult-->continue po furosemide; no good surgical candidate; followup  As out pt -home with previous home dose of furosemide 80mg  daily Persistent nausea -This has been a chronic issue--multifactorial including acalculous cholecystitis, antibiotics, chronic renal failure, -Treated symptomatically -tolerating diet on day of dc Deconditioning -PT evaluation--> recommended home health PT Diabetes mellitus, type II - lantus on hold -cont sliding scale and Tradjenta -05/10/2014 hemoglobin A1c 6.8 -CBGs remain controlled Hypertension - cont Coreg -improvement with diuresis CKD (chronic kidney disease), stage IV - Baseline creatinine 2.3-2.6 -monitored closely on IV lasix -serum creatinine 3.0 on day of d/c -follow up with Dr. Corliss Parish after  d/c Hypokalemia - replaced and recheck -Check magnesium--2.2 History of colon cancer 2002 -Status post right hemicolectomy  Family Communication: daughter updated at beside Disposition Plan: Home when medically stable  Discharge Condition: stable  Disposition: home Follow-up Information    Follow up with FRIED, ROBERT L, MD In 1 week.   Specialty:  Family Medicine   Contact information:   [place] [address] [phone]       Follow up with GOLDSBOROUGH,KELLIE A, MD In 1 week.   Specialty:  Nephrology   Contact information:   [place] [address] [phone]       Follow up with [facility] E, MD In 3 weeks.   Specialty:  General Surgery   Contact information:   [address] [place] [address] [phone]       Diet:carb modified/heart healthy Wt Readings from Last 3 Encounters:  05/16/14 81.6 kg (179 lb 14.3 oz)  04/25/14 78.654 kg (173 lb 6.4 oz)  04/07/14 75.66 kg (166 lb 12.8 oz)    History of present illness:  81 year old female with a history of diabetes mellitus, colon cancer status post right hemicolectomy, hypertension, chronic atrial fibrillation, and CKD stage IV's presented with 2 day history of right upper quadrant and epigastric abdominal pain. The patient had an ultrasound of her abdomen in the outpatient setting which showed an enlarged gallbladder with sludge and gallbladder wall thickening. As a result, the patient was sent to the hospital for further evaluation of possible cholecystitis. General surgery was consulted. Because of the patient's comorbidities, they recommended percutaneous cholecystotomy tube. This was placed on 05/11/2014 with good clinical response.  The pt was transitioned from zosyn to oral cipro which she tolerated.  She will go home with 1 week of po cipro.  She developed CHF decompensation.  She improved with IV  lasix and cardiology was called for worsen aortic stenosis on echo.   They did not feel she is a good surgical candidate.  She should continue on lasix and coreg and follow up in office.  Consultants: General surgery cardiology  Discharge Exam: Filed Vitals:   05/16/14 0550  BP: 165/79  Pulse: 76  Temp: 98.6 F (37 C)  Resp: 17   Filed Vitals:   05/15/14 1428 05/15/14 2137 05/16/14 0500 05/16/14 0550  BP:  150/67  165/79  Pulse:  64  76  Temp:  98.2 F (36.8 C)  98.6 F (37 C)  TempSrc:  Oral  Oral  Resp:  16  17  Height:      Weight:   81.6 kg (179 lb 14.3 oz)   SpO2: 93% 93%  90%   General: A&O x 3, NAD, pleasant, cooperative Cardiovascular: IRRR, no rub, no gallop, no S3 Respiratory: fine bibasilar crackles Abdomen:soft, nontender, nondistended, positive bowel sounds Extremities: 1+LE edema, No lymphangitis, no petechiae  Discharge Instructions      Discharge Instructions    Diet - low sodium heart healthy    Complete by:  As directed      Increase activity slowly    Complete by:  As directed             Medication List    TAKE these medications        acetaminophen 500 MG tablet  Commonly known as:  TYLENOL  Take 500 mg by mouth every 6 (six) hours as needed for pain.     aspirin EC 81 MG tablet  Take 81 mg by mouth daily.     calcitRIOL 0.25 MCG capsule  Commonly known as:  ROCALTROL  Take 0.25 mcg by mouth daily.     carvedilol 25 MG tablet  Commonly known as:  COREG  TAKE 1 TABLET (25 MG TOTAL) BY MOUTH 2 (TWO) TIMES DAILY WITH A MEAL.     ciprofloxacin 500 MG tablet  Commonly known as:  CIPRO  Take 1 tablet (500 mg total) by mouth daily at 6 PM.     docusate sodium 100 MG capsule  Commonly known as:  COLACE  Take 100 mg by mouth daily as needed for constipation.     furosemide 80 MG tablet  Commonly known as:  LASIX  Take 80 mg by mouth daily.     HYDROcodone-acetaminophen 5-325 MG per tablet  Commonly known as:  NORCO/VICODIN  Take 1 tablet by mouth every 4 (four) hours as needed for moderate  pain.     insulin glargine 100 UNIT/ML injection  Commonly known as:  LANTUS  Inject 8 Units into the skin at bedtime as needed (if cbg over 150).     linagliptin 5 MG Tabs tablet  Commonly known as:  TRADJENTA  Take 5 mg by mouth daily.     LORazepam 0.5 MG tablet  Commonly known as:  ATIVAN  Take 0.5 mg by mouth 2 (two) times daily.     meclizine 12.5 MG tablet  Commonly known as:  ANTIVERT  Take 12.5 mg by mouth as needed for dizziness.     mirtazapine 7.5 MG tablet  Commonly known as:  REMERON  Take 7.5 mg by mouth at bedtime.     promethazine 12.5 MG tablet  Commonly known as:  PHENERGAN  Take 1 tablet (12.5 mg total) by mouth every 6 (six) hours as needed for nausea or vomiting.     simvastatin 20 MG  tablet  Commonly known as:  ZOCOR  Take 1 tablet (20 mg total) by mouth at bedtime.     vitamin B-12 1000 MCG tablet  Commonly known as:  CYANOCOBALAMIN  Take 1,000 mcg by mouth daily.         The results of significant diagnostics from this hospitalization (including imaging, microbiology, ancillary and laboratory) are listed below for reference.    Significant Diagnostic Studies: Dg Chest 2 View  05/13/2014   CLINICAL DATA:  Dyspnea and productive cough. Acute cholecystitis and status post percutaneous cholecystostomy tube placement.  EXAM: CHEST - 2 VIEW  COMPARISON:  04/17/2014 study at [place] [facility].  FINDINGS: There is development of new interstitial edema with associated bilateral pleural effusions, right greater than left. Bilateral lower lobe opacities likely represent atelectasis. However, component of bibasilar pneumonia cannot be excluded. Opacity at the right lung base is more prominent than at the left. No pneumothorax. The heart size is normal.  IMPRESSION: New interstitial edema with bilateral pleural effusions, right greater than left. Bilateral lower lobe opacities, right greater than left, representing atelectasis and/or infiltrates.    Electronically Signed   By: Aletta Edouard M.D.   On: 05/13/2014 09:22   US Abdomen Complete  05/09/2014   CLINICAL DATA:  Right upper quadrant pain.  EXAM: ULTRASOUND ABDOMEN COMPLETE  COMPARISON:  None.  FINDINGS: Gallbladder: Gallbladder is enlarged with sludge. Gallbladder wall thickening to 5.2 mm diffuse positive Murphy sign. These findings consistent with cholecystitis.  Common bile duct: Diameter: 2.4 mm  Liver: No focal lesion identified. Within normal limits in parenchymal echogenicity.  IVC: No abnormality visualized.  Pancreas: Not visualized due to overlying bowel gas.  Spleen: Size and appearance within normal limits.  Right Kidney: Length: 9.2 cm. Diffuse renal cortical thinning. No mass or hydronephrosis.  Left Kidney:  Obscured by bowel gas.  Abdominal aorta: No aneurysm visualized.  Other findings: None.  IMPRESSION: Enlarged gallbladder with sludge. Gallbladder wall thickening and positive Murphy's sign. These findings are consistent with cholecystitis.   Electronically Signed   By: Marcello Moores  Register   On: 05/09/2014 15:32   Ir Perc Cholecystostomy  05/11/2014   INDICATION: 81 year old female with a history of acute cholecystitis.  The patient is a poor surgical candidate from the standpoint of a cholecystectomy given that she has extensive cardiac comorbidities. She has been referred for a percutaneous cholecystostomy tube.  EXAM: ULTRASOUND AND FLUOROSCOPIC-GUIDED CHOLECYSTOSTOMY TUBE PLACEMENT  COMPARISON:  None.  MEDICATIONS: Fentanyl 50 mcg IV; Versed 0.5 mg IV; The patient is currently admitted to the hospital and on intravenous antibiotics. Antibiotics were administered within an appropriate time frame prior to skin puncture.  ANESTHESIA/SEDATION: Total Moderate Sedation Time  Twenty minutes  CONTRAST:  Intraluminal contrast  FLUOROSCOPY TIME:  48 seconds  COMPLICATIONS: None immediate.  PROCEDURE: Informed written consent was obtained from the patient and the patient's family after a  discussion of the risks, benefits and alternatives to treatment. Questions regarding the procedure were encouraged and answered, including the potential for a permanent tube draining the gallbladder if the patient is not a surgical candidate. A timeout was performed prior to the initiation of the procedure.  The right upper abdominal quadrant was prepped and draped in the usual sterile fashion, and a sterile drape was applied covering the operative field. Maximum barrier sterile technique with sterile gowns and gloves were used for the procedure. A timeout was performed prior to the initiation of the procedure. Local anesthesia was provided with 1%  lidocaine with epinephrine.  Ultrasound scanning of the right upper quadrant demonstrates a markedly dilated gallbladder. Of note, the patient reported pain with ultrasound imaging over the gallbladder. Utilizing a transhepatic approach, a 22 gauge needle was advanced into the gallbladder under direct ultrasound guidance. An ultrasound image was saved for documentation purposes. Appropriate intraluminal puncture was confirmed with the efflux of bile and advancement of an 0.018 wire into the gallbladder lumen. The needle was exchanged for an [place] set. A small amount of contrast was injected to confirm appropriate intraluminal positioning. Over a Benson wire, a 79.2-French Cook cholecystomy tube was advanced into the gallbladder fossa, coiled and locked. Bile was aspirated and a small amount of contrast was injected as several post procedural spot radiographic images were obtained in various obliquities. The catheter was secured to the skin with suture, connected to a drainage bag and a dressing was placed. The patient tolerated the procedure well without immediate post procedural complication.  IMPRESSION: Status post placement of percutaneous cholecystostomy tube to gravity drainage.  Signed,  Dulcy Fanny. Earleen Newport DO  [facility]   [facility]  PLAN: The cholecystostomy tube will remain to gravity drainage during the hospitalization.  Agree with continue antibiotics.  Candidacy for future/interval cholecystectomy will be per general surgery.  If required, future cholecystostomy tube check and future tube exchanges may be performed after an initial time frame of 68 weeks.   Electronically Signed   By: Corrie Mckusick D.O.   On: 05/11/2014 16:45     Microbiology: Recent Results (from the past 240 hour(s))  Culture, routine-abscess     Status: None   Collection Time: 05/11/14  3:33 PM  Result Value Ref Range Status   Specimen Description ABSCESS GALL BLADDER  Final   Special Requests NONE  Final   Gram Stain   Final    MODERATE WBC PRESENT, PREDOMINANTLY PMN NO SQUAMOUS EPITHELIAL CELLS SEEN FEW GRAM VARIABLE ROD Performed at [facility]    Culture   Final    MODERATE CITROBACTER FREUNDII Performed at [facility]    Report Status 05/14/2014 FINAL  Final   Organism ID, Bacteria CITROBACTER FREUNDII  Final      Susceptibility   Citrobacter freundii - MIC*    CEFAZOLIN >=64 RESISTANT Resistant     CEFEPIME <=1 SENSITIVE Sensitive     CEFTAZIDIME >=64 RESISTANT Resistant     CEFTRIAXONE 32 INTERMEDIATE Intermediate     CIPROFLOXACIN <=0.25 SENSITIVE Sensitive     GENTAMICIN <=1 SENSITIVE Sensitive     IMIPENEM <=0.25 SENSITIVE Sensitive     PIP/TAZO 32 INTERMEDIATE Intermediate     TOBRAMYCIN <=1 SENSITIVE Sensitive     TRIMETH/SULFA <=20 SENSITIVE Sensitive     * MODERATE CITROBACTER FREUNDII     Labs: Basic Metabolic Panel:  Recent Labs Lab 05/12/14 0611 05/13/14 0450 05/14/14 0432 05/15/14 0535 05/16/14 0528  NA 140 138 141 142 141  K 3.8 4.2 4.5 4.3 4.7  CL 108 109 110 108 110  CO2 27 24 26 26 27   GLUCOSE 128* 122* 90 115* 117*  BUN 38* 38* 36* 35* 34*  CREATININE 2.99* 3.06* 3.28* 3.20* 3.00*  CALCIUM 8.1* 8.5 8.2* 8.6 8.5  MG  --   --  2.2  --   --    Liver  Function Tests:  Recent Labs Lab 05/09/14 2043 05/10/14 0437 05/15/14 0535  AST 15 13 15   ALT 7 <5 7  ALKPHOS 136* 126* 103  BILITOT  1.4* 1.1 0.5  PROT 5.9* 6.1 5.7*  ALBUMIN 2.9* 2.5* 2.4*    Recent Labs Lab 05/09/14 2043  LIPASE 32   No results for input(s): AMMONIA in the last 168 hours. CBC:  Recent Labs Lab 05/09/14 2043 05/10/14 0437 05/11/14 0614 05/12/14 0611 05/13/14 0450  WBC 14.8* 11.9* 8.2 7.5 6.4  NEUTROABS 12.2*  --   --   --   --   HGB 10.0* 9.4* 8.9* 8.7* 8.5*  HCT 31.4* 29.5* 28.6* 28.7* 27.6*  MCV 89.0 89.9 92.6 92.9 92.3  PLT 140* 128* 125* 135* 134*   Cardiac Enzymes: No results for input(s): CKTOTAL, CKMB, CKMBINDEX, TROPONINI in the last 168 hours. BNP: Invalid input(s): POCBNP CBG:  Recent Labs Lab 05/15/14 0800 05/15/14 1255 05/15/14 1729 05/15/14 2138 05/16/14 0751  GLUCAP 112* 141* 140* 143* 123*    Time coordinating discharge:  Greater than 30 minutes  Signed:  Derk Doubek, DO [facility] Pager: [phone] 05/16/2014, 11:24 AM

## 2014-05-16 NOTE — Care Management (Signed)
05-16-14 Important message from Medicare given to patient . Brunette Lavalle RN BSN         

## 2014-06-07 ENCOUNTER — Other Ambulatory Visit: Payer: Self-pay | Admitting: General Surgery

## 2014-06-07 DIAGNOSIS — K8000 Calculus of gallbladder with acute cholecystitis without obstruction: Secondary | ICD-10-CM

## 2014-06-12 ENCOUNTER — Telehealth (HOSPITAL_COMMUNITY): Payer: Self-pay

## 2014-06-12 NOTE — Telephone Encounter (Signed)
Returned phone call to Kerin Perna about pt wanting a later appt for drain check. No answer, left a voicemail to have them call back. AW

## 2014-06-13 ENCOUNTER — Ambulatory Visit (HOSPITAL_COMMUNITY)
Admission: RE | Admit: 2014-06-13 | Discharge: 2014-06-13 | Disposition: A | Discharge status: Home / Self Care | Payer: Medicare Other | Source: Ambulatory Visit | Attending: Interventional Radiology | Admitting: Interventional Radiology

## 2014-06-13 ENCOUNTER — Telehealth: Payer: Self-pay | Admitting: Gastroenterology

## 2014-06-13 DIAGNOSIS — K831 Obstruction of bile duct: Secondary | ICD-10-CM | POA: Insufficient documentation

## 2014-06-13 DIAGNOSIS — K819 Cholecystitis, unspecified: Secondary | ICD-10-CM | POA: Diagnosis not present

## 2014-06-13 DIAGNOSIS — K8000 Calculus of gallbladder with acute cholecystitis without obstruction: Secondary | ICD-10-CM

## 2014-06-13 MED ORDER — IOHEXOL 300 MG/ML  SOLN
50.0000 mL | Freq: Once | INTRAMUSCULAR | Status: AC | PRN
  Administered 2014-06-13: 10 mL via INTRAVENOUS

## 2014-06-13 NOTE — Telephone Encounter (Signed)
See records in [place]. IR is involved. Appointment for tomorrow. [facility] will notify the patient of the appointment.

## 2014-06-14 ENCOUNTER — Encounter: Payer: Self-pay | Admitting: Gastroenterology

## 2014-06-14 ENCOUNTER — Ambulatory Visit (INDEPENDENT_AMBULATORY_CARE_PROVIDER_SITE_OTHER): Payer: Medicare Other | Admitting: Gastroenterology

## 2014-06-14 ENCOUNTER — Other Ambulatory Visit: Payer: Medicare Other

## 2014-06-14 ENCOUNTER — Telehealth: Payer: Self-pay | Admitting: Gastroenterology

## 2014-06-14 VITALS — BP 144/60 | HR 68 | Ht 63.0 in | Wt 168.4 lb

## 2014-06-14 DIAGNOSIS — K8040 Calculus of bile duct with cholecystitis, unspecified, without obstruction: Secondary | ICD-10-CM | POA: Diagnosis not present

## 2014-06-14 DIAGNOSIS — K8042 Calculus of bile duct with acute cholecystitis without obstruction: Secondary | ICD-10-CM | POA: Insufficient documentation

## 2014-06-14 DIAGNOSIS — Z8500 Personal history of malignant neoplasm of unspecified digestive organ: Secondary | ICD-10-CM

## 2014-06-14 DIAGNOSIS — C189 Malignant neoplasm of colon, unspecified: Secondary | ICD-10-CM

## 2014-06-14 HISTORY — DX: Calculus of bile duct with acute cholecystitis without obstruction: K80.42

## 2014-06-14 MED ORDER — INDOMETHACIN 50 MG RE SUPP: 50.0000 mg | Freq: Once | RECTAL | Status: DC

## 2014-06-14 NOTE — Patient Instructions (Signed)
You have been scheduled for your hospital procedure Separate instructions have been given

## 2014-06-14 NOTE — Addendum Note (Signed)
Addended by: Oda Kilts on: 06/14/2014 02:40 PM   Modules accepted: Orders

## 2014-06-14 NOTE — Telephone Encounter (Signed)
Rec'd from [facility] forward 8 pages to [place]

## 2014-06-14 NOTE — Assessment & Plan Note (Addendum)
Cholangiogram demonstrates a distal bile duct stone which is obstructing.  Recommendations #1 ERCP #2 plan to leave the cholecystostomy tube in until the distal bile duct obstruction is relieved.  At that point will wait several weeks until the tube can be pulled provided that drainage significantly decreases, as expected  The risks, benefits, and possible complications of the procedure, including  But not limited to bleeding, perforation, surgery, and the 5-10% risk for pancreatitis, were explained to the patient.  It was explained that  post ERCP pancreatitis which, while usually is mild, occasionally maybe severe and even life-threatening.  Patient's questions were answered.

## 2014-06-14 NOTE — Progress Notes (Signed)
_                                                                                                                History of Present Illness:  [name] is an 81 year old white female referred at the request of Dr. freed for evaluation of a possible common bile duct stone.  She was hospitalized in February, 2016 with protracted nausea and vomiting.  It was determined that she had a calculus cholecystitis and a percutaneous cholecystostomy tube was placed.  A cholangiogram done through the tube demonstrated a distal bile duct filling defect with obstruction.  She was discharged with a draining cholecystostomy tube which has continued to drain bilious fluid.  She is without pain or fever.   Past Medical History  Diagnosis Date  . CKD (chronic kidney disease), stage IV     a. Baseline Cr reported 2.5-2.6.  Marland Kitchen Anxiety   . Diabetes mellitus     diabetic retinopathy  . Anemia   . Moderate aortic stenosis     a. By echo 10/2013.  Marland Kitchen Urinary tract infection   . Fall at home   . Colostomy care     colon ca - h/o bloody output from bag 12/2012 (ED visit)  . Colon cancer 30 year ago  . Chronic atrial fibrillation     a. not on anticoagulation due to increased risk of falls, advanced age.  . Chronic diastolic CHF (congestive heart failure)     a. Echo 10/2013: mild LVH, EF 60-65%, mod AS, mildly dilated LA, mod dilated RA, PASP 33.  Marland Kitchen Hypertension   . Carotid disease, bilateral     a. Carotid duplex 08/3843: 36-46% RICA/LICA.  Marland Kitchen H/O cardiovascular stress test     a. 2002: nuc negative for ischemia, EF 65%.   Past Surgical History  Procedure Laterality Date  . Abdominal surgery    . Abdominal hysterectomy    . Colon surgery    . Appendectomy    . Colostomy    . Cardioversion  09/30/2011    Procedure: CARDIOVERSION;  Surgeon: [name], MD;  Location: [place];  Service: Cardiovascular;  Laterality: N/A;  . I&d extremity  11/20/2011    Procedure: IRRIGATION AND DEBRIDEMENT  EXTREMITY;  Surgeon: Alta Corning, MD;  Location: [facility];  Service: Orthopedics;  Laterality: Left;  . Av fistula placement Left 06/02/2012    Procedure: ARTERIOVENOUS (AV) FISTULA CREATION;  Surgeon: Conrad San Pedro, MD;  Location: [place];  Service: Vascular;  Laterality: Left;  Bascilic Fistula  . Bascilic vein transposition Left 08/18/2012    Procedure: BASCILIC VEIN TRANSPOSITION;  Surgeon: Conrad [place]ville, MD;  Location: [facility];  Service: Vascular;  Laterality: Left;  Left 2nd stage Basilic Vein Transposition    family history includes Cancer in her son; Diabetes in her daughter, sister, and son; Heart disease in her father and son; Hyperlipidemia in her daughter and son; Hypertension in her daughter and son; Other in her daughter. Current Outpatient Prescriptions  Medication Sig Dispense Refill  . acetaminophen (TYLENOL) 500 MG tablet Take 500 mg by mouth every 6 (six) hours as needed for pain.     Marland Kitchen aspirin EC 81 MG tablet Take 81 mg by mouth daily.    . calcitRIOL (ROCALTROL) 0.25 MCG capsule Take 0.25 mcg by mouth daily.     . carvedilol (COREG) 25 MG tablet TAKE 1 TABLET (25 MG TOTAL) BY MOUTH 2 (TWO) TIMES DAILY WITH A MEAL. 60 tablet 3  . clotrimazole-betamethasone (LOTRISONE) lotion Apply topically as needed.   0  . docusate sodium (COLACE) 100 MG capsule Take 100 mg by mouth daily as needed for constipation.     . furosemide (LASIX) 80 MG tablet Take 80 mg by mouth daily.  3  . HYDROcodone-acetaminophen (NORCO/VICODIN) 5-325 MG per tablet Take 1 tablet by mouth every 4 (four) hours as needed for moderate pain. 20 tablet 0  . insulin glargine (LANTUS) 100 UNIT/ML injection Inject 8 Units into the skin at bedtime as needed (if cbg over 150).     Marland Kitchen linagliptin (TRADJENTA) 5 MG TABS tablet Take 5 mg by mouth daily.     Marland Kitchen LORazepam (ATIVAN) 0.5 MG tablet Take 0.5 mg by mouth 2 (two) times daily.    . meclizine (ANTIVERT) 12.5 MG tablet Take 12.5 mg by mouth as needed for dizziness.     .  mirtazapine (REMERON) 7.5 MG tablet Take 7.5 mg by mouth at bedtime.     . promethazine (PHENERGAN) 12.5 MG tablet Take 1 tablet (12.5 mg total) by mouth every 6 (six) hours as needed for nausea or vomiting. 30 tablet 0  . simvastatin (ZOCOR) 20 MG tablet Take 1 tablet (20 mg total) by mouth at bedtime. 30 tablet 3  . vitamin B-12 (CYANOCOBALAMIN) 1000 MCG tablet Take 1,000 mcg by mouth daily.     No current facility-administered medications for this visit.   Allergies as of 06/14/2014 - Review Complete 06/14/2014  Allergen Reaction Noted  . Codeine Nausea Only 06/02/2012  . Keflet [cephalexin] Nausea Only 01/17/2012  . Levaquin [levofloxacin in d5w] Nausea Only 12/22/2011  . Morphine and related Nausea Only 06/02/2012  . Oxycodone Other (See Comments) 12/22/2011    reports that she has never smoked. Her smokeless tobacco use includes Snuff. She reports that she does not drink alcohol or use illicit drugs.   Review of Systems: Pertinent positive and negative review of systems were noted in the above HPI section. All other review of systems were otherwise negative.  Vital signs were reviewed in today's medical record Physical Exam: General: Well developed , well nourished, no acute distress Skin: anicteric Head: Normocephalic and atraumatic Eyes:  sclerae anicteric, EOMI Ears: Normal auditory acuity Mouth: No deformity or lesions Neck: Supple, no masses or thyromegaly Lungs: Clear throughout to auscultation Heart: Regular rate and rhythm; no murmurs, rubs or bruits Abdomen: Soft, non tender and non distended. No masses, hepatosplenomegaly or hernias noted. Normal Bowel sounds.  There is a percutaneous drain in the right upper quadrant Rectal:deferred Musculoskeletal: Symmetrical with no gross deformities  Skin: No lesions on visible extremities Pulses:  Normal pulses noted Extremities: No clubbing, cyanosis, edema or deformities noted Neurological: Alert oriented x 4, grossly  nonfocal Cervical Nodes:  No significant cervical adenopathy Inguinal Nodes: No significant inguinal adenopathy Psychological:  Alert and cooperative. Normal mood and affect  See Assessment and Plan under Problem List

## 2014-06-15 ENCOUNTER — Other Ambulatory Visit (INDEPENDENT_AMBULATORY_CARE_PROVIDER_SITE_OTHER): Payer: Medicare Other | Admitting: *Deleted

## 2014-06-15 DIAGNOSIS — I350 Nonrheumatic aortic (valve) stenosis: Secondary | ICD-10-CM

## 2014-06-15 DIAGNOSIS — E785 Hyperlipidemia, unspecified: Secondary | ICD-10-CM

## 2014-06-15 LAB — LIPID PANEL
Cholesterol: 92 mg/dL (ref 0–200)
HDL: 36.3 mg/dL — ABNORMAL LOW (ref >39.00)
LDL Cholesterol: 29 mg/dL (ref 0–99)
NonHDL: 55.7
TRIGLYCERIDES: 136 mg/dL (ref 0.0–149.0)
Total CHOL/HDL Ratio: 3
VLDL: 27.2 mg/dL (ref 0.0–40.0)

## 2014-06-15 LAB — ALT: ALT: 4 U/L (ref 0–35)

## 2014-06-22 ENCOUNTER — Telehealth: Payer: Self-pay | Admitting: *Deleted

## 2014-06-22 NOTE — Telephone Encounter (Signed)
Patients appt for ERCP moved to 4/19  Patient aware of time changes

## 2014-06-29 ENCOUNTER — Encounter (HOSPITAL_COMMUNITY): Payer: Self-pay | Admitting: *Deleted

## 2014-07-11 ENCOUNTER — Encounter (HOSPITAL_COMMUNITY): Payer: Self-pay | Admitting: *Deleted

## 2014-07-11 ENCOUNTER — Ambulatory Visit (HOSPITAL_COMMUNITY): Payer: Medicare Other

## 2014-07-11 ENCOUNTER — Encounter (HOSPITAL_COMMUNITY)
Admission: RE | Disposition: A | Discharge status: Home / Self Care | Payer: Self-pay | Source: Ambulatory Visit | Attending: Gastroenterology

## 2014-07-11 ENCOUNTER — Ambulatory Visit (HOSPITAL_COMMUNITY): Payer: Medicare Other | Admitting: Anesthesiology

## 2014-07-11 ENCOUNTER — Ambulatory Visit (HOSPITAL_BASED_OUTPATIENT_CLINIC_OR_DEPARTMENT_OTHER)
Admission: RE | Admit: 2014-07-11 | Discharge: 2014-07-11 | Disposition: A | Discharge status: Home / Self Care | Payer: Medicare Other | Source: Ambulatory Visit | Attending: Gastroenterology | Admitting: Gastroenterology

## 2014-07-11 DIAGNOSIS — K8042 Calculus of bile duct with acute cholecystitis without obstruction: Secondary | ICD-10-CM

## 2014-07-11 DIAGNOSIS — N184 Chronic kidney disease, stage 4 (severe): Secondary | ICD-10-CM

## 2014-07-11 DIAGNOSIS — I5033 Acute on chronic diastolic (congestive) heart failure: Secondary | ICD-10-CM | POA: Diagnosis not present

## 2014-07-11 DIAGNOSIS — K8040 Calculus of bile duct with cholecystitis, unspecified, without obstruction: Secondary | ICD-10-CM

## 2014-07-11 DIAGNOSIS — Z933 Colostomy status: Secondary | ICD-10-CM | POA: Insufficient documentation

## 2014-07-11 DIAGNOSIS — M549 Dorsalgia, unspecified: Secondary | ICD-10-CM | POA: Diagnosis present

## 2014-07-11 DIAGNOSIS — Z85048 Personal history of other malignant neoplasm of rectum, rectosigmoid junction, and anus: Secondary | ICD-10-CM

## 2014-07-11 DIAGNOSIS — I350 Nonrheumatic aortic (valve) stenosis: Secondary | ICD-10-CM | POA: Insufficient documentation

## 2014-07-11 DIAGNOSIS — K8050 Calculus of bile duct without cholangitis or cholecystitis without obstruction: Secondary | ICD-10-CM | POA: Insufficient documentation

## 2014-07-11 DIAGNOSIS — I959 Hypotension, unspecified: Secondary | ICD-10-CM | POA: Diagnosis present

## 2014-07-11 DIAGNOSIS — Z7982 Long term (current) use of aspirin: Secondary | ICD-10-CM | POA: Insufficient documentation

## 2014-07-11 DIAGNOSIS — I129 Hypertensive chronic kidney disease with stage 1 through stage 4 chronic kidney disease, or unspecified chronic kidney disease: Secondary | ICD-10-CM | POA: Diagnosis present

## 2014-07-11 DIAGNOSIS — Z66 Do not resuscitate: Secondary | ICD-10-CM | POA: Diagnosis present

## 2014-07-11 DIAGNOSIS — E11319 Type 2 diabetes mellitus with unspecified diabetic retinopathy without macular edema: Secondary | ICD-10-CM | POA: Diagnosis present

## 2014-07-11 DIAGNOSIS — I482 Chronic atrial fibrillation: Secondary | ICD-10-CM

## 2014-07-11 DIAGNOSIS — Z888 Allergy status to other drugs, medicaments and biological substances status: Secondary | ICD-10-CM

## 2014-07-11 DIAGNOSIS — Z885 Allergy status to narcotic agent status: Secondary | ICD-10-CM

## 2014-07-11 DIAGNOSIS — I2510 Atherosclerotic heart disease of native coronary artery without angina pectoris: Secondary | ICD-10-CM | POA: Diagnosis present

## 2014-07-11 DIAGNOSIS — I5032 Chronic diastolic (congestive) heart failure: Secondary | ICD-10-CM

## 2014-07-11 DIAGNOSIS — Z794 Long term (current) use of insulin: Secondary | ICD-10-CM

## 2014-07-11 DIAGNOSIS — Z79899 Other long term (current) drug therapy: Secondary | ICD-10-CM | POA: Insufficient documentation

## 2014-07-11 DIAGNOSIS — F419 Anxiety disorder, unspecified: Secondary | ICD-10-CM | POA: Diagnosis present

## 2014-07-11 DIAGNOSIS — R0602 Shortness of breath: Secondary | ICD-10-CM | POA: Diagnosis not present

## 2014-07-11 DIAGNOSIS — D649 Anemia, unspecified: Secondary | ICD-10-CM | POA: Insufficient documentation

## 2014-07-11 DIAGNOSIS — Z85038 Personal history of other malignant neoplasm of large intestine: Secondary | ICD-10-CM

## 2014-07-11 DIAGNOSIS — E1122 Type 2 diabetes mellitus with diabetic chronic kidney disease: Secondary | ICD-10-CM | POA: Diagnosis present

## 2014-07-11 DIAGNOSIS — G8929 Other chronic pain: Secondary | ICD-10-CM | POA: Diagnosis present

## 2014-07-11 DIAGNOSIS — J9601 Acute respiratory failure with hypoxia: Secondary | ICD-10-CM | POA: Diagnosis present

## 2014-07-11 DIAGNOSIS — Z793 Long term (current) use of hormonal contraceptives: Secondary | ICD-10-CM

## 2014-07-11 DIAGNOSIS — J918 Pleural effusion in other conditions classified elsewhere: Secondary | ICD-10-CM | POA: Diagnosis present

## 2014-07-11 DIAGNOSIS — Z9049 Acquired absence of other specified parts of digestive tract: Secondary | ICD-10-CM | POA: Diagnosis present

## 2014-07-11 DIAGNOSIS — K831 Obstruction of bile duct: Secondary | ICD-10-CM | POA: Diagnosis present

## 2014-07-11 DIAGNOSIS — K8020 Calculus of gallbladder without cholecystitis without obstruction: Secondary | ICD-10-CM | POA: Diagnosis not present

## 2014-07-11 DIAGNOSIS — I272 Other secondary pulmonary hypertension: Secondary | ICD-10-CM | POA: Diagnosis present

## 2014-07-11 HISTORY — PX: ERCP: SHX5425

## 2014-07-11 HISTORY — PX: BILIARY STENT PLACEMENT: SHX5538

## 2014-07-11 LAB — POCT I-STAT 4, (NA,K, GLUC, HGB,HCT)
GLUCOSE: 129 mg/dL — AB (ref 70–99)
HEMATOCRIT: 27 % — AB (ref 36.0–46.0)
HEMOGLOBIN: 9.2 g/dL — AB (ref 12.0–15.0)
POTASSIUM: 3.7 mmol/L (ref 3.5–5.1)
Sodium: 137 mmol/L (ref 135–145)

## 2014-07-11 SURGERY — ERCP, WITH INTERVENTION IF INDICATED
Anesthesia: General

## 2014-07-11 MED ORDER — CIPROFLOXACIN IN D5W 400 MG/200ML IV SOLN
INTRAVENOUS | Status: AC
  Filled 2014-07-11: qty 200

## 2014-07-11 MED ORDER — LACTATED RINGERS IV SOLN
INTRAVENOUS | Status: DC
  Administered 2014-07-11: 11:00:00 via INTRAVENOUS

## 2014-07-11 MED ORDER — FENTANYL CITRATE (PF) 100 MCG/2ML IJ SOLN
INTRAMUSCULAR | Status: AC
  Filled 2014-07-11: qty 2

## 2014-07-11 MED ORDER — FENTANYL CITRATE (PF) 100 MCG/2ML IJ SOLN
INTRAMUSCULAR | Status: DC | PRN
  Administered 2014-07-11 (×2): 100 ug via INTRAVENOUS

## 2014-07-11 MED ORDER — CIPROFLOXACIN IN D5W 400 MG/200ML IV SOLN
400.0000 mg | Freq: Once | INTRAVENOUS | Status: AC
  Administered 2014-07-11: 400 mg via INTRAVENOUS

## 2014-07-11 MED ORDER — LIDOCAINE HCL (CARDIAC) 20 MG/ML IV SOLN
INTRAVENOUS | Status: DC | PRN
  Administered 2014-07-11: 50 mg via INTRAVENOUS

## 2014-07-11 MED ORDER — SODIUM CHLORIDE 0.9 % IV SOLN
INTRAVENOUS | Status: DC
  Administered 2014-07-11: 11:00:00 via INTRAVENOUS

## 2014-07-11 MED ORDER — INDOMETHACIN 50 MG RE SUPP
100.0000 mg | Freq: Once | RECTAL | Status: AC
  Administered 2014-07-11: 100 mg via RECTAL
  Filled 2014-07-11: qty 2

## 2014-07-11 MED ORDER — GLUCAGON HCL RDNA (DIAGNOSTIC) 1 MG IJ SOLR
INTRAMUSCULAR | Status: DC | PRN
  Administered 2014-07-11: 1 mg via INTRAVENOUS

## 2014-07-11 MED ORDER — MEPERIDINE HCL 100 MG/ML IJ SOLN
6.2500 mg | INTRAMUSCULAR | Status: DC | PRN
Start: 2014-07-11 — End: 2014-07-11

## 2014-07-11 MED ORDER — PROPOFOL 10 MG/ML IV BOLUS
INTRAVENOUS | Status: AC
  Filled 2014-07-11: qty 20

## 2014-07-11 MED ORDER — INDOMETHACIN 50 MG RE SUPP
50.0000 mg | Freq: Once | RECTAL | Status: DC
  Filled 2014-07-11: qty 1

## 2014-07-11 MED ORDER — PROPOFOL 10 MG/ML IV BOLUS
INTRAVENOUS | Status: DC | PRN
  Administered 2014-07-11: 100 mg via INTRAVENOUS

## 2014-07-11 MED ORDER — METHYLENE BLUE 1 % INJ SOLN
INTRAMUSCULAR | Status: AC
  Filled 2014-07-11: qty 10

## 2014-07-11 MED ORDER — PROMETHAZINE HCL 25 MG/ML IJ SOLN: 6.2500 mg | INTRAMUSCULAR | Status: DC | PRN

## 2014-07-11 MED ORDER — SUCCINYLCHOLINE CHLORIDE 20 MG/ML IJ SOLN
INTRAMUSCULAR | Status: DC | PRN
  Administered 2014-07-11: 80 mg via INTRAVENOUS

## 2014-07-11 MED ORDER — SODIUM CHLORIDE 0.9 % IV SOLN
INTRAVENOUS | Status: DC | PRN
  Administered 2014-07-11: 55 mL

## 2014-07-11 NOTE — Anesthesia Postprocedure Evaluation (Signed)
  Anesthesia Post-op Note  Patient: Lady Deutscher  Procedure(s) Performed: Procedure(s) (LRB): ENDOSCOPIC RETROGRADE CHOLANGIOPANCREATOGRAPHY (ERCP) (N/A) BILIARY STENT PLACEMENT (N/A)  Patient Location: PACU  Anesthesia Type: General  Level of Consciousness: awake and alert   Airway and Oxygen Therapy: Patient Spontanous Breathing  Post-op Pain: mild  Post-op Assessment: Post-op Vital signs reviewed, Patient's Cardiovascular Status Stable, Respiratory Function Stable, Patent Airway and No signs of Nausea or vomiting  Last Vitals:  Filed Vitals:   07/11/14 1259  BP: 126/48  Pulse: 65  Temp:   Resp: 17    Post-op Vital Signs: stable   Complications: No apparent anesthesia complications

## 2014-07-11 NOTE — Transfer of Care (Signed)
Immediate Anesthesia Transfer of Care Note  Patient: [name]  Procedure(s) Performed: Procedure(s): ENDOSCOPIC RETROGRADE CHOLANGIOPANCREATOGRAPHY (ERCP) (N/A) BILIARY STENT PLACEMENT (N/A)  Patient Location: PACU and Endoscopy Unit  Anesthesia Type:General  Level of Consciousness: awake and alert   Airway & Oxygen Therapy: Patient Spontanous Breathing and Patient connected to face mask oxygen  Post-op Assessment: Report given to RN and Post -op Vital signs reviewed and stable  Post vital signs: Reviewed and stable  Last Vitals:  Filed Vitals:   07/11/14 1051  BP: 132/44  Pulse: 64  Temp: 36.9 C  Resp: 16    Complications: No apparent anesthesia complications

## 2014-07-11 NOTE — H&P (View-Only) (Signed)
_                                                                                                                History of Present Illness:  [name] is an 81 year old white female referred at the request of Dr. freed for evaluation of a possible common bile duct stone.  She was hospitalized in February, 2016 with protracted nausea and vomiting.  It was determined that she had a calculus cholecystitis and a percutaneous cholecystostomy tube was placed.  A cholangiogram done through the tube demonstrated a distal bile duct filling defect with obstruction.  She was discharged with a draining cholecystostomy tube which has continued to drain bilious fluid.  She is without pain or fever.   Past Medical History  Diagnosis Date  . CKD (chronic kidney disease), stage IV     a. Baseline Cr reported 2.5-2.6.  Marland Kitchen Anxiety   . Diabetes mellitus     diabetic retinopathy  . Anemia   . Moderate aortic stenosis     a. By echo 10/2013.  Marland Kitchen Urinary tract infection   . Fall at home   . Colostomy care     colon ca - h/o bloody output from bag 12/2012 (ED visit)  . Colon cancer 30 year ago  . Chronic atrial fibrillation     a. not on anticoagulation due to increased risk of falls, advanced age.  . Chronic diastolic CHF (congestive heart failure)     a. Echo 10/2013: mild LVH, EF 60-65%, mod AS, mildly dilated LA, mod dilated RA, PASP 33.  Marland Kitchen Hypertension   . Carotid disease, bilateral     a. Carotid duplex 04/8364: 29-47% RICA/LICA.  Marland Kitchen H/O cardiovascular stress test     a. 2002: nuc negative for ischemia, EF 65%.   Past Surgical History  Procedure Laterality Date  . Abdominal surgery    . Abdominal hysterectomy    . Colon surgery    . Appendectomy    . Colostomy    . Cardioversion  09/30/2011    Procedure: CARDIOVERSION;  Surgeon: [name], MD;  Location: [facility];  Service: Cardiovascular;  Laterality: N/A;  . I&d extremity  11/20/2011    Procedure: IRRIGATION AND DEBRIDEMENT  EXTREMITY;  Surgeon: Alta Corning, MD;  Location: [facility];  Service: Orthopedics;  Laterality: Left;  . Av fistula placement Left 06/02/2012    Procedure: ARTERIOVENOUS (AV) FISTULA CREATION;  Surgeon: Conrad Kula, MD;  Location: [facility];  Service: Vascular;  Laterality: Left;  Bascilic Fistula  . Bascilic vein transposition Left 08/18/2012    Procedure: BASCILIC VEIN TRANSPOSITION;  Surgeon: Conrad Sherrard, MD;  Location: [facility] OR;  Service: Vascular;  Laterality: Left;  Left 2nd stage Basilic Vein Transposition    family history includes Cancer in her son; Diabetes in her daughter, sister, and son; Heart disease in her father and son; Hyperlipidemia in her daughter and son; Hypertension in her daughter and son; Other in her daughter. Current Outpatient Prescriptions  Medication Sig Dispense Refill  . acetaminophen (TYLENOL) 500 MG tablet Take 500 mg by mouth every 6 (six) hours as needed for pain.     Marland Kitchen aspirin EC 81 MG tablet Take 81 mg by mouth daily.    . calcitRIOL (ROCALTROL) 0.25 MCG capsule Take 0.25 mcg by mouth daily.     . carvedilol (COREG) 25 MG tablet TAKE 1 TABLET (25 MG TOTAL) BY MOUTH 2 (TWO) TIMES DAILY WITH A MEAL. 60 tablet 3  . clotrimazole-betamethasone (LOTRISONE) lotion Apply topically as needed.   0  . docusate sodium (COLACE) 100 MG capsule Take 100 mg by mouth daily as needed for constipation.     . furosemide (LASIX) 80 MG tablet Take 80 mg by mouth daily.  3  . HYDROcodone-acetaminophen (NORCO/VICODIN) 5-325 MG per tablet Take 1 tablet by mouth every 4 (four) hours as needed for moderate pain. 20 tablet 0  . insulin glargine (LANTUS) 100 UNIT/ML injection Inject 8 Units into the skin at bedtime as needed (if cbg over 150).     Marland Kitchen linagliptin (TRADJENTA) 5 MG TABS tablet Take 5 mg by mouth daily.     Marland Kitchen LORazepam (ATIVAN) 0.5 MG tablet Take 0.5 mg by mouth 2 (two) times daily.    . meclizine (ANTIVERT) 12.5 MG tablet Take 12.5 mg by mouth as needed for dizziness.     .  mirtazapine (REMERON) 7.5 MG tablet Take 7.5 mg by mouth at bedtime.     . promethazine (PHENERGAN) 12.5 MG tablet Take 1 tablet (12.5 mg total) by mouth every 6 (six) hours as needed for nausea or vomiting. 30 tablet 0  . simvastatin (ZOCOR) 20 MG tablet Take 1 tablet (20 mg total) by mouth at bedtime. 30 tablet 3  . vitamin B-12 (CYANOCOBALAMIN) 1000 MCG tablet Take 1,000 mcg by mouth daily.     No current facility-administered medications for this visit.   Allergies as of 06/14/2014 - Review Complete 06/14/2014  Allergen Reaction Noted  . Codeine Nausea Only 06/02/2012  . Keflet [cephalexin] Nausea Only 01/17/2012  . Levaquin [levofloxacin in d5w] Nausea Only 12/22/2011  . Morphine and related Nausea Only 06/02/2012  . Oxycodone Other (See Comments) 12/22/2011    reports that she has never smoked. Her smokeless tobacco use includes Snuff. She reports that she does not drink alcohol or use illicit drugs.   Review of Systems: Pertinent positive and negative review of systems were noted in the above HPI section. All other review of systems were otherwise negative.  Vital signs were reviewed in today's medical record Physical Exam: General: Well developed , well nourished, no acute distress Skin: anicteric Head: Normocephalic and atraumatic Eyes:  sclerae anicteric, EOMI Ears: Normal auditory acuity Mouth: No deformity or lesions Neck: Supple, no masses or thyromegaly Lungs: Clear throughout to auscultation Heart: Regular rate and rhythm; no murmurs, rubs or bruits Abdomen: Soft, non tender and non distended. No masses, hepatosplenomegaly or hernias noted. Normal Bowel sounds.  There is a percutaneous drain in the right upper quadrant Rectal:deferred Musculoskeletal: Symmetrical with no gross deformities  Skin: No lesions on visible extremities Pulses:  Normal pulses noted Extremities: No clubbing, cyanosis, edema or deformities noted Neurological: Alert oriented x 4, grossly  nonfocal Cervical Nodes:  No significant cervical adenopathy Inguinal Nodes: No significant inguinal adenopathy Psychological:  Alert and cooperative. Normal mood and affect  See Assessment and Plan under Problem List

## 2014-07-11 NOTE — Interval H&P Note (Signed)
History and Physical Interval Note:  07/11/2014 11:33 AM  [name]  has presented today for surgery, with the diagnosis of Stone in bile duct   The various methods of treatment have been discussed with the patient and family. After consideration of risks, benefits and other options for treatment, the patient has consented to  Procedure(s): ENDOSCOPIC RETROGRADE CHOLANGIOPANCREATOGRAPHY (ERCP) (N/A) BILIARY STENT PLACEMENT (N/A) as a surgical intervention .  The patient's history has been reviewed, patient examined, no change in status, stable for surgery.  I have reviewed the patient's chart and labs.  Questions were answered to the patient's satisfaction.    The recent H&P (dated *06/14/14**) was reviewed, the patient was examined and there is no change in the patients condition since that H&P was completed.   [name]  07/11/2014, 11:34 AM   [name]

## 2014-07-11 NOTE — Op Note (Signed)
[facility] [place], [id number]   ERCP PROCEDURE REPORT        EXAM DATE: 07/11/2014  PATIENT NAME:          [name], [name]          MR #:        [id number]  BIRTHDATE:       12-15-1925     VISIT #:     [phone] ATTENDING:     Inda Castle, MD     STATUS:     outpatient ASSISTANT:      Cletis Athens and Dustin Flock  INDICATIONS:  The patient is a 81 yr old female here for an ERCP due to suspected or rule out bile duct stones. PROCEDURE PERFORMED:     ERCP with removal of calculus/calculi ERCP with sphincterotomy/papillotomy MEDICATIONS:     Monitored anesthesia care and Indomethacin 100 mg PR Unasyn 1.5 g IV  CONSENT: The patient understands the risks and benefits of the procedure and understands that these risks include, but are not limited to: sedation, allergic reaction, infection, perforation and/or bleeding. Alternative means of evaluation and treatment include, among others: physical exam, x-rays, and/or surgical intervention. The patient elects to proceed with this endoscopic procedure.  DESCRIPTION OF PROCEDURE: During intra-op preparation period all mechanical & medical equipment was checked for proper function. Hand hygiene and appropriate measures for infection prevention was taken. After the risks, benefits and alternatives of the procedure were thoroughly explained, Informed was verified, confirmed and timeout was successfully executed by the treatment team. With the patient in left semi-prone position, medications were administered intravenously.The    was passed from the mouth into the esophagus and further advanced from the esophagus into the stomach. From stomach scope was directed to the second portion of the duodenum. Major papilla was aligned with the duodenoscope. The scope position was confirmed fluoroscopically. Rest of the findings/therapeutics are given below. The scope was then completely withdrawn from  the patient and the procedure completed. The pulse, BP, and O2 saturation were monitored and documented by the physician and the nursing staff throughout the entire procedure. The patient was cared for as planned according to standard protocol. The patient was then discharged to recovery in stable condition and with appropriate post procedure care.  The common bile duct was selectively cannulated.  Injection demonstrated a 15-18 mm stone in the distal duct. A 15 mm sphincterotomy was made.  The stone was extracted and a 50 mm balloon stone extractor.  The extractor was passed multiple times through the sphincterotomized papilla.  A large amount of stone debris was extracted.  final cholangiogram did not demonstrate any retained stones.    ADVERSE EVENT: IMPRESSIONS:     choledocholithiasis?"status post enterotomy and removal of retained bile duct stones  RECOMMENDATIONS:     monitor output from cholecystostomy tube. Provided that output decreases significantly will plan on pulling the tube in several days REPEAT EXAM:   ___________________________________ Inda Castle, MD eSigned:  Inda Castle, MD 07/11/2014 1:55 PM   cc: Briscoe Deutscher, MD  CPT CODES: ICD9 CODES:  The ICD and CPT codes recommended by this software are interpretations from the data that the clinical staff has captured with the software.  The verification of the translation of this report to the ICD and CPT codes and modifiers is the sole responsibility of the health care institution and practicing physician where this report was generated.  [employer] will  not be held responsible for the validity of the ICD and CPT codes included on this report.  AMA assumes no liability for data contained or not contained herein. CPT is a Designer, television/film set of the [employer].   PATIENT NAME:  Yitty, Roads MR#: [id number]

## 2014-07-11 NOTE — Anesthesia Procedure Notes (Signed)
Procedure Name: Intubation Date/Time: 07/11/2014 12:07 PM Performed by: Sharlette Dense Pre-anesthesia Checklist: Patient identified Patient Re-evaluated:Patient Re-evaluated prior to inductionOxygen Delivery Method: Circle system utilized Preoxygenation: Pre-oxygenation with 100% oxygen Intubation Type: IV induction Ventilation: Mask ventilation without difficulty Laryngoscope Size: Miller and 1 Grade View: Grade I Tube type: Oral Tube size: 7.0 mm Number of attempts: 1 Airway Equipment and Method: Stylet Placement Confirmation: ETT inserted through vocal cords under direct vision,  breath sounds checked- equal and bilateral and positive ETCO2 Secured at: 20 cm Tube secured with: Tape Dental Injury: Teeth and Oropharynx as per pre-operative assessment

## 2014-07-11 NOTE — Anesthesia Preprocedure Evaluation (Signed)
Anesthesia Evaluation  Patient identified by MRN, date of birth, ID band Patient awake    Reviewed: Allergy & Precautions, NPO status , Patient's Chart, lab work & pertinent test results  Airway Mallampati: II  TM Distance: >3 FB Neck ROM: Full    Dental no notable dental hx. (+) Edentulous Upper, Edentulous Lower   Pulmonary neg pulmonary ROS,  breath sounds clear to auscultation  Pulmonary exam normal       Cardiovascular hypertension, Pt. on medications +CHF + dysrhythmias Atrial Fibrillation + Valvular Problems/Murmurs AS Rhythm:Regular Rate:Normal     Neuro/Psych negative neurological ROS  negative psych ROS   GI/Hepatic negative GI ROS, Neg liver ROS,   Endo/Other  diabetes, Type 2, Oral Hypoglycemic Agents  Renal/GU CRFRenal disease  negative genitourinary   Musculoskeletal negative musculoskeletal ROS (+)   Abdominal   Peds negative pediatric ROS (+)  Hematology negative hematology ROS (+)   Anesthesia Other Findings   Reproductive/Obstetrics negative OB ROS                             Anesthesia Physical Anesthesia Plan  ASA: II  Anesthesia Plan: General   Post-op Pain Management:    Induction: Intravenous  Airway Management Planned: Oral ETT  Additional Equipment:   Intra-op Plan:   Post-operative Plan: Extubation in OR  Informed Consent: I have reviewed the patients History and Physical, chart, labs and discussed the procedure including the risks, benefits and alternatives for the proposed anesthesia with the patient or authorized representative who has indicated his/her understanding and acceptance.   Dental advisory given  Plan Discussed with: CRNA  Anesthesia Plan Comments:         Anesthesia Quick Evaluation

## 2014-07-12 ENCOUNTER — Encounter (HOSPITAL_COMMUNITY): Payer: Self-pay | Admitting: Gastroenterology

## 2014-07-14 ENCOUNTER — Inpatient Hospital Stay (HOSPITAL_COMMUNITY): Payer: Medicare Other

## 2014-07-14 ENCOUNTER — Encounter (HOSPITAL_COMMUNITY): Payer: Self-pay

## 2014-07-14 ENCOUNTER — Emergency Department (HOSPITAL_COMMUNITY): Payer: Medicare Other

## 2014-07-14 ENCOUNTER — Inpatient Hospital Stay (HOSPITAL_COMMUNITY)
Admission: EM | Admit: 2014-07-14 | Discharge: 2014-07-24 | DRG: 291 | Disposition: A | Discharge status: Home / Self Care | Payer: Medicare Other | Attending: Internal Medicine | Admitting: Internal Medicine

## 2014-07-14 DIAGNOSIS — R918 Other nonspecific abnormal finding of lung field: Secondary | ICD-10-CM | POA: Insufficient documentation

## 2014-07-14 DIAGNOSIS — E1122 Type 2 diabetes mellitus with diabetic chronic kidney disease: Secondary | ICD-10-CM | POA: Diagnosis present

## 2014-07-14 DIAGNOSIS — J948 Other specified pleural conditions: Secondary | ICD-10-CM | POA: Diagnosis not present

## 2014-07-14 DIAGNOSIS — D649 Anemia, unspecified: Secondary | ICD-10-CM | POA: Diagnosis present

## 2014-07-14 DIAGNOSIS — J9601 Acute respiratory failure with hypoxia: Secondary | ICD-10-CM | POA: Diagnosis present

## 2014-07-14 DIAGNOSIS — R0602 Shortness of breath: Secondary | ICD-10-CM

## 2014-07-14 DIAGNOSIS — K8050 Calculus of bile duct without cholangitis or cholecystitis without obstruction: Secondary | ICD-10-CM | POA: Diagnosis present

## 2014-07-14 DIAGNOSIS — I482 Chronic atrial fibrillation, unspecified: Secondary | ICD-10-CM | POA: Diagnosis present

## 2014-07-14 DIAGNOSIS — Z9049 Acquired absence of other specified parts of digestive tract: Secondary | ICD-10-CM | POA: Diagnosis present

## 2014-07-14 DIAGNOSIS — I5033 Acute on chronic diastolic (congestive) heart failure: Secondary | ICD-10-CM | POA: Diagnosis present

## 2014-07-14 DIAGNOSIS — I350 Nonrheumatic aortic (valve) stenosis: Secondary | ICD-10-CM

## 2014-07-14 DIAGNOSIS — Z7982 Long term (current) use of aspirin: Secondary | ICD-10-CM | POA: Diagnosis not present

## 2014-07-14 DIAGNOSIS — K831 Obstruction of bile duct: Secondary | ICD-10-CM | POA: Insufficient documentation

## 2014-07-14 DIAGNOSIS — I10 Essential (primary) hypertension: Secondary | ICD-10-CM | POA: Diagnosis not present

## 2014-07-14 DIAGNOSIS — I5032 Chronic diastolic (congestive) heart failure: Secondary | ICD-10-CM | POA: Diagnosis not present

## 2014-07-14 DIAGNOSIS — G8929 Other chronic pain: Secondary | ICD-10-CM | POA: Diagnosis present

## 2014-07-14 DIAGNOSIS — Z794 Long term (current) use of insulin: Secondary | ICD-10-CM | POA: Diagnosis not present

## 2014-07-14 DIAGNOSIS — Z79899 Other long term (current) drug therapy: Secondary | ICD-10-CM | POA: Diagnosis not present

## 2014-07-14 DIAGNOSIS — Z66 Do not resuscitate: Secondary | ICD-10-CM | POA: Insufficient documentation

## 2014-07-14 DIAGNOSIS — J9621 Acute and chronic respiratory failure with hypoxia: Secondary | ICD-10-CM | POA: Diagnosis present

## 2014-07-14 DIAGNOSIS — M549 Dorsalgia, unspecified: Secondary | ICD-10-CM | POA: Diagnosis present

## 2014-07-14 DIAGNOSIS — Z888 Allergy status to other drugs, medicaments and biological substances status: Secondary | ICD-10-CM | POA: Diagnosis not present

## 2014-07-14 DIAGNOSIS — N184 Chronic kidney disease, stage 4 (severe): Secondary | ICD-10-CM | POA: Diagnosis present

## 2014-07-14 DIAGNOSIS — E11319 Type 2 diabetes mellitus with unspecified diabetic retinopathy without macular edema: Secondary | ICD-10-CM | POA: Diagnosis present

## 2014-07-14 DIAGNOSIS — F419 Anxiety disorder, unspecified: Secondary | ICD-10-CM | POA: Diagnosis present

## 2014-07-14 DIAGNOSIS — R748 Abnormal levels of other serum enzymes: Secondary | ICD-10-CM | POA: Diagnosis not present

## 2014-07-14 DIAGNOSIS — R7989 Other specified abnormal findings of blood chemistry: Secondary | ICD-10-CM | POA: Insufficient documentation

## 2014-07-14 DIAGNOSIS — J918 Pleural effusion in other conditions classified elsewhere: Secondary | ICD-10-CM | POA: Diagnosis present

## 2014-07-14 DIAGNOSIS — K8040 Calculus of bile duct with cholecystitis, unspecified, without obstruction: Secondary | ICD-10-CM | POA: Diagnosis not present

## 2014-07-14 DIAGNOSIS — E118 Type 2 diabetes mellitus with unspecified complications: Secondary | ICD-10-CM

## 2014-07-14 DIAGNOSIS — J90 Pleural effusion, not elsewhere classified: Secondary | ICD-10-CM | POA: Diagnosis not present

## 2014-07-14 DIAGNOSIS — Z85038 Personal history of other malignant neoplasm of large intestine: Secondary | ICD-10-CM | POA: Diagnosis not present

## 2014-07-14 DIAGNOSIS — K8042 Calculus of bile duct with acute cholecystitis without obstruction: Secondary | ICD-10-CM | POA: Diagnosis present

## 2014-07-14 DIAGNOSIS — I272 Other secondary pulmonary hypertension: Secondary | ICD-10-CM | POA: Diagnosis present

## 2014-07-14 DIAGNOSIS — I129 Hypertensive chronic kidney disease with stage 1 through stage 4 chronic kidney disease, or unspecified chronic kidney disease: Secondary | ICD-10-CM | POA: Diagnosis present

## 2014-07-14 DIAGNOSIS — Z793 Long term (current) use of hormonal contraceptives: Secondary | ICD-10-CM | POA: Diagnosis not present

## 2014-07-14 DIAGNOSIS — Z933 Colostomy status: Secondary | ICD-10-CM | POA: Diagnosis not present

## 2014-07-14 DIAGNOSIS — I2510 Atherosclerotic heart disease of native coronary artery without angina pectoris: Secondary | ICD-10-CM | POA: Diagnosis present

## 2014-07-14 DIAGNOSIS — Z885 Allergy status to narcotic agent status: Secondary | ICD-10-CM | POA: Diagnosis not present

## 2014-07-14 DIAGNOSIS — K819 Cholecystitis, unspecified: Secondary | ICD-10-CM

## 2014-07-14 DIAGNOSIS — I959 Hypotension, unspecified: Secondary | ICD-10-CM | POA: Diagnosis present

## 2014-07-14 DIAGNOSIS — Z85048 Personal history of other malignant neoplasm of rectum, rectosigmoid junction, and anus: Secondary | ICD-10-CM | POA: Diagnosis not present

## 2014-07-14 LAB — CBC WITH DIFFERENTIAL/PLATELET
Basophils Absolute: 0 10*3/uL (ref 0.0–0.1)
Basophils Relative: 0 % (ref 0–1)
EOS PCT: 0 % (ref 0–5)
Eosinophils Absolute: 0 10*3/uL (ref 0.0–0.7)
HCT: 30.8 % — ABNORMAL LOW (ref 36.0–46.0)
Hemoglobin: 9.6 g/dL — ABNORMAL LOW (ref 12.0–15.0)
LYMPHS ABS: 1.4 10*3/uL (ref 0.7–4.0)
Lymphocytes Relative: 14 % (ref 12–46)
MCH: 28.5 pg (ref 26.0–34.0)
MCHC: 31.2 g/dL (ref 30.0–36.0)
MCV: 91.4 fL (ref 78.0–100.0)
MONO ABS: 0.8 10*3/uL (ref 0.1–1.0)
MONOS PCT: 9 % (ref 3–12)
Neutro Abs: 7.6 10*3/uL (ref 1.7–7.7)
Neutrophils Relative %: 77 % (ref 43–77)
PLATELETS: 165 10*3/uL (ref 150–400)
RBC: 3.37 MIL/uL — AB (ref 3.87–5.11)
RDW: 14.8 % (ref 11.5–15.5)
WBC: 9.9 10*3/uL (ref 4.0–10.5)

## 2014-07-14 LAB — BODY FLUID CELL COUNT WITH DIFFERENTIAL
EOS FL: 0 %
LYMPHS FL: 27 %
Monocyte-Macrophage-Serous Fluid: 1 % — ABNORMAL LOW (ref 50–90)
Neutrophil Count, Fluid: 72 % — ABNORMAL HIGH (ref 0–25)
Other Cells, Fluid: 0 %
Total Nucleated Cell Count, Fluid: 4472 cu mm — ABNORMAL HIGH (ref 0–1000)

## 2014-07-14 LAB — URINALYSIS, ROUTINE W REFLEX MICROSCOPIC
BILIRUBIN URINE: NEGATIVE
Glucose, UA: NEGATIVE mg/dL
Hgb urine dipstick: NEGATIVE
Ketones, ur: NEGATIVE mg/dL
Nitrite: NEGATIVE
PROTEIN: NEGATIVE mg/dL
SPECIFIC GRAVITY, URINE: 1.009 (ref 1.005–1.030)
UROBILINOGEN UA: 0.2 mg/dL (ref 0.0–1.0)
pH: 5 (ref 5.0–8.0)

## 2014-07-14 LAB — TROPONIN I: Troponin I: 0.03 ng/mL (ref <0.031)

## 2014-07-14 LAB — I-STAT CG4 LACTIC ACID, ED: Lactic Acid, Venous: 1.24 mmol/L (ref 0.5–2.0)

## 2014-07-14 LAB — COMPREHENSIVE METABOLIC PANEL
ALT: 8 U/L (ref 0–35)
AST: 15 U/L (ref 0–37)
Albumin: 2.7 g/dL — ABNORMAL LOW (ref 3.5–5.2)
Alkaline Phosphatase: 117 U/L (ref 39–117)
Anion gap: 13 (ref 5–15)
BUN: 47 mg/dL — AB (ref 6–23)
CALCIUM: 8.7 mg/dL (ref 8.4–10.5)
CO2: 24 mmol/L (ref 19–32)
Chloride: 105 mmol/L (ref 96–112)
Creatinine, Ser: 2.69 mg/dL — ABNORMAL HIGH (ref 0.50–1.10)
GFR calc Af Amer: 17 mL/min — ABNORMAL LOW (ref >90)
GFR, EST NON AFRICAN AMERICAN: 15 mL/min — AB (ref >90)
GLUCOSE: 121 mg/dL — AB (ref 70–99)
Potassium: 3.8 mmol/L (ref 3.5–5.1)
Sodium: 142 mmol/L (ref 135–145)
Total Bilirubin: 0.6 mg/dL (ref 0.3–1.2)
Total Protein: 6.4 g/dL (ref 6.0–8.3)

## 2014-07-14 LAB — PROTEIN, BODY FLUID: Total protein, fluid: 3.1 g/dL

## 2014-07-14 LAB — URINE MICROSCOPIC-ADD ON

## 2014-07-14 LAB — BRAIN NATRIURETIC PEPTIDE: B NATRIURETIC PEPTIDE 5: 526.1 pg/mL — AB (ref 0.0–100.0)

## 2014-07-14 LAB — LIPASE, BLOOD: Lipase: 38 U/L (ref 11–59)

## 2014-07-14 LAB — LACTATE DEHYDROGENASE, PLEURAL OR PERITONEAL FLUID: LD FL: 110 U/L — AB (ref 3–23)

## 2014-07-14 LAB — CHOLESTEROL, TOTAL: Cholesterol: 87 mg/dL (ref 0–200)

## 2014-07-14 MED ORDER — ASPIRIN EC 81 MG PO TBEC
81.0000 mg | DELAYED_RELEASE_TABLET | Freq: Every day | ORAL | Status: DC
  Administered 2014-07-14 – 2014-07-24 (×11): 81 mg via ORAL
  Filled 2014-07-14 (×12): qty 1

## 2014-07-14 MED ORDER — PROMETHAZINE HCL 25 MG PO TABS: 12.5000 mg | ORAL_TABLET | Freq: Four times a day (QID) | ORAL | Status: DC | PRN

## 2014-07-14 MED ORDER — INSULIN ASPART 100 UNIT/ML ~~LOC~~ SOLN
0.0000 [IU] | Freq: Three times a day (TID) | SUBCUTANEOUS | Status: DC
Start: 2014-07-15 — End: 2014-07-24
  Administered 2014-07-15: 1 [IU] via SUBCUTANEOUS
  Administered 2014-07-15: 2 [IU] via SUBCUTANEOUS
  Administered 2014-07-16 (×3): 1 [IU] via SUBCUTANEOUS
  Administered 2014-07-17: 2 [IU] via SUBCUTANEOUS
  Administered 2014-07-17: 1 [IU] via SUBCUTANEOUS
  Administered 2014-07-17 – 2014-07-18 (×2): 2 [IU] via SUBCUTANEOUS
  Administered 2014-07-19: 1 [IU] via SUBCUTANEOUS
  Administered 2014-07-19 – 2014-07-20 (×2): 2 [IU] via SUBCUTANEOUS
  Administered 2014-07-20: 1 [IU] via SUBCUTANEOUS
  Administered 2014-07-21: 2 [IU] via SUBCUTANEOUS
  Administered 2014-07-22: 3 [IU] via SUBCUTANEOUS
  Administered 2014-07-22 – 2014-07-23 (×3): 2 [IU] via SUBCUTANEOUS
  Administered 2014-07-23 – 2014-07-24 (×2): 1 [IU] via SUBCUTANEOUS
  Administered 2014-07-24: 2 [IU] via SUBCUTANEOUS

## 2014-07-14 MED ORDER — DOCUSATE SODIUM 100 MG PO CAPS
100.0000 mg | ORAL_CAPSULE | Freq: Every day | ORAL | Status: DC | PRN
  Filled 2014-07-14: qty 1

## 2014-07-14 MED ORDER — FUROSEMIDE 80 MG PO TABS
80.0000 mg | ORAL_TABLET | Freq: Every morning | ORAL | Status: DC
Start: 2014-07-15 — End: 2014-07-17
  Administered 2014-07-15 – 2014-07-16 (×2): 80 mg via ORAL
  Filled 2014-07-14 (×3): qty 1

## 2014-07-14 MED ORDER — CALCITRIOL 0.25 MCG PO CAPS
0.2500 ug | ORAL_CAPSULE | Freq: Every day | ORAL | Status: DC
  Administered 2014-07-14 – 2014-07-24 (×11): 0.25 ug via ORAL
  Filled 2014-07-14 (×12): qty 1

## 2014-07-14 MED ORDER — MIRTAZAPINE 7.5 MG PO TABS
7.5000 mg | ORAL_TABLET | Freq: Every day | ORAL | Status: DC
  Administered 2014-07-14 – 2014-07-23 (×10): 7.5 mg via ORAL
  Filled 2014-07-14 (×12): qty 1

## 2014-07-14 MED ORDER — FUROSEMIDE 10 MG/ML IJ SOLN
40.0000 mg | Freq: Once | INTRAMUSCULAR | Status: AC
  Administered 2014-07-14: 40 mg via INTRAVENOUS

## 2014-07-14 MED ORDER — INSULIN GLARGINE 100 UNIT/ML ~~LOC~~ SOLN
8.0000 [IU] | Freq: Every evening | SUBCUTANEOUS | Status: DC | PRN
  Filled 2014-07-14: qty 0.08

## 2014-07-14 MED ORDER — SODIUM CHLORIDE 0.9 % IJ SOLN: 3.0000 mL | INTRAMUSCULAR | Status: DC | PRN

## 2014-07-14 MED ORDER — TROLAMINE SALICYLATE 10 % EX CREA: TOPICAL_CREAM | Freq: Two times a day (BID) | CUTANEOUS | Status: DC | PRN

## 2014-07-14 MED ORDER — ACETAMINOPHEN 650 MG RE SUPP: 650.0000 mg | Freq: Four times a day (QID) | RECTAL | Status: DC | PRN

## 2014-07-14 MED ORDER — SODIUM CHLORIDE 0.9 % IV SOLN
250.0000 mL | INTRAVENOUS | Status: DC | PRN
  Administered 2014-07-21: 11:00:00 via INTRAVENOUS

## 2014-07-14 MED ORDER — DOCUSATE SODIUM 100 MG PO CAPS
100.0000 mg | ORAL_CAPSULE | Freq: Two times a day (BID) | ORAL | Status: DC
  Administered 2014-07-14 – 2014-07-22 (×13): 100 mg via ORAL
  Filled 2014-07-14 (×22): qty 1

## 2014-07-14 MED ORDER — SODIUM CHLORIDE 0.9 % IJ SOLN
3.0000 mL | Freq: Two times a day (BID) | INTRAMUSCULAR | Status: DC
  Administered 2014-07-14 – 2014-07-24 (×14): 3 mL via INTRAVENOUS

## 2014-07-14 MED ORDER — ACETAMINOPHEN 325 MG PO TABS
650.0000 mg | ORAL_TABLET | Freq: Four times a day (QID) | ORAL | Status: DC | PRN
  Administered 2014-07-15 – 2014-07-19 (×2): 650 mg via ORAL
  Filled 2014-07-14 (×2): qty 2

## 2014-07-14 MED ORDER — CLOTRIMAZOLE-BETAMETHASONE 1-0.05 % EX LOTN: 1.0000 "application " | TOPICAL_LOTION | Freq: Every day | CUTANEOUS | Status: DC | PRN

## 2014-07-14 MED ORDER — ACETAMINOPHEN 500 MG PO TABS: 500.0000 mg | ORAL_TABLET | Freq: Four times a day (QID) | ORAL | Status: DC | PRN

## 2014-07-14 MED ORDER — SIMVASTATIN 20 MG PO TABS
20.0000 mg | ORAL_TABLET | Freq: Every day | ORAL | Status: DC
  Administered 2014-07-14: 20 mg via ORAL
  Filled 2014-07-14 (×2): qty 1

## 2014-07-14 MED ORDER — MUSCLE RUB 10-15 % EX CREA
TOPICAL_CREAM | Freq: Two times a day (BID) | CUTANEOUS | Status: DC | PRN
  Filled 2014-07-14: qty 85

## 2014-07-14 MED ORDER — ONDANSETRON HCL 4 MG/2ML IJ SOLN: 4.0000 mg | Freq: Four times a day (QID) | INTRAMUSCULAR | Status: DC | PRN

## 2014-07-14 MED ORDER — VITAMIN B-12 1000 MCG PO TABS
1000.0000 ug | ORAL_TABLET | Freq: Every day | ORAL | Status: DC
  Administered 2014-07-14 – 2014-07-24 (×11): 1000 ug via ORAL
  Filled 2014-07-14 (×12): qty 1

## 2014-07-14 MED ORDER — ONDANSETRON HCL 4 MG PO TABS: 4.0000 mg | ORAL_TABLET | Freq: Four times a day (QID) | ORAL | Status: DC | PRN

## 2014-07-14 MED ORDER — SODIUM CHLORIDE 0.9 % IJ SOLN
3.0000 mL | Freq: Two times a day (BID) | INTRAMUSCULAR | Status: DC
  Administered 2014-07-14 – 2014-07-24 (×17): 3 mL via INTRAVENOUS

## 2014-07-14 MED ORDER — HEPARIN SODIUM (PORCINE) 5000 UNIT/ML IJ SOLN
5000.0000 [IU] | Freq: Three times a day (TID) | INTRAMUSCULAR | Status: AC
  Administered 2014-07-14 – 2014-07-20 (×19): 5000 [IU] via SUBCUTANEOUS
  Filled 2014-07-14 (×23): qty 1

## 2014-07-14 MED ORDER — LORAZEPAM 0.5 MG PO TABS
0.5000 mg | ORAL_TABLET | Freq: Two times a day (BID) | ORAL | Status: DC
  Administered 2014-07-14 – 2014-07-24 (×20): 0.5 mg via ORAL
  Filled 2014-07-14 (×20): qty 1

## 2014-07-14 MED ORDER — HYDROCODONE-ACETAMINOPHEN 5-325 MG PO TABS
1.0000 | ORAL_TABLET | ORAL | Status: DC | PRN
  Administered 2014-07-14: 2 via ORAL
  Filled 2014-07-14: qty 2

## 2014-07-14 MED ORDER — MECLIZINE HCL 12.5 MG PO TABS
12.5000 mg | ORAL_TABLET | Freq: Every day | ORAL | Status: DC | PRN
  Filled 2014-07-14: qty 1

## 2014-07-14 MED ORDER — CARVEDILOL 12.5 MG PO TABS
12.5000 mg | ORAL_TABLET | Freq: Two times a day (BID) | ORAL | Status: DC
  Administered 2014-07-14 – 2014-07-15 (×3): 12.5 mg via ORAL
  Filled 2014-07-14 (×4): qty 1

## 2014-07-14 NOTE — Consult Note (Signed)
Name: [name] MRN: [id number] DOB: 1925-08-07    ADMISSION DATE:  07/14/2014 CONSULTATION DATE:  4/22  REFERRING MD :  Betsey Holiday   CHIEF COMPLAINT:  Right pleural effusion   BRIEF PATIENT DESCRIPTION:  81 year old female w/ sig PMH chronic diastolic HF, AS, CAF (not on anticoagulation). Recently underwent ERCP On 4/19 for retained bile duct stones. Discharged to home w/ cystostomy tube. Since d/c has had 3 d h/oo progressive LE edema increased SOB and right sided pleuritic type CP for which she presented to the ER. On CXR was found to have large right pleural effusion for which we have been consulted.    SIGNIFICANT EVENTS  ERCP 4/19  STUDIES:   HISTORY OF PRESENT ILLNESS:   See above   PAST MEDICAL HISTORY :   has a past medical history of CKD (chronic kidney disease), stage IV; Anxiety; Diabetes mellitus; Anemia; Moderate aortic stenosis; Urinary tract infection; Fall at home; Colostomy care; Colon cancer (30 year ago); Chronic atrial fibrillation; Chronic diastolic CHF (congestive heart failure); Hypertension; Carotid disease, bilateral; and H/O cardiovascular stress test.  has past surgical history that includes Abdominal surgery; Abdominal hysterectomy; Colon surgery; Appendectomy; Colostomy; Cardioversion (09/30/2011); I&D extremity (11/20/2011); AV fistula placement (Left, 06/02/2012); Bascilic vein transposition (Left, 08/18/2012); ERCP (N/A, 07/11/2014); and biliary stent placement (N/A, 07/11/2014). Prior to Admission medications   Medication Sig Start Date End Date Taking? Authorizing Provider  aspirin EC 81 MG tablet Take 81 mg by mouth daily.   Yes Historical Provider, MD  calcitRIOL (ROCALTROL) 0.25 MCG capsule Take 0.25 mcg by mouth daily.  04/06/12  Yes Historical Provider, MD  carvedilol (COREG) 25 MG tablet TAKE 1 TABLET (25 MG TOTAL) BY MOUTH 2 (TWO) TIMES DAILY WITH A MEAL. 03/13/14  Yes [name], MD  clotrimazole-betamethasone (LOTRISONE) lotion Apply 1  application topically daily as needed (apply to rash on stomach as needed).  05/25/14  Yes Historical Provider, MD  docusate sodium (COLACE) 100 MG capsule Take 100 mg by mouth daily as needed for constipation.    Yes Historical Provider, MD  furosemide (LASIX) 80 MG tablet Take 80 mg by mouth every morning.  04/10/14  Yes Historical Provider, MD  HYDROcodone-acetaminophen (NORCO/VICODIN) 5-325 MG per tablet Take 1 tablet by mouth every 4 (four) hours as needed for moderate pain. 05/16/14  Yes Orson Eva, MD  insulin glargine (LANTUS) 100 UNIT/ML injection Inject 8 Units into the skin at bedtime as needed (if cbg over 150).    Yes Historical Provider, MD  linagliptin (TRADJENTA) 5 MG TABS tablet Take 5 mg by mouth daily.    Yes Historical Provider, MD  LORazepam (ATIVAN) 0.5 MG tablet Take 0.5 mg by mouth 2 (two) times daily.   Yes Historical Provider, MD  meclizine (ANTIVERT) 12.5 MG tablet Take 12.5 mg by mouth as needed for dizziness.  11/23/12  Yes Historical Provider, MD  mirtazapine (REMERON) 7.5 MG tablet Take 7.5 mg by mouth at bedtime.  09/28/12  Yes Historical Provider, MD  promethazine (PHENERGAN) 12.5 MG tablet Take 1 tablet (12.5 mg total) by mouth every 6 (six) hours as needed for nausea or vomiting. 03/14/14  Yes Ripudeep Krystal Eaton, MD  simvastatin (ZOCOR) 20 MG tablet Take 1 tablet (20 mg total) by mouth at bedtime. 04/28/14  Yes [name], MD  vitamin B-12 (CYANOCOBALAMIN) 1000 MCG tablet Take 1,000 mcg by mouth daily.   Yes Historical Provider, MD  acetaminophen (TYLENOL) 500 MG tablet Take 500 mg by  mouth every 6 (six) hours as needed for mild pain.     Historical Provider, MD   Allergies  Allergen Reactions  . Levaquin [Levofloxacin] Nausea Only  . Codeine Nausea Only  . Keflet [Cephalexin] Nausea Only  . Morphine And Related Nausea Only  . Oxycodone Other (See Comments)    Abnormal behavior    FAMILY HISTORY:  family history includes Cancer in her son; Diabetes in her daughter,  sister, and son; Heart disease in her father and son; Hyperlipidemia in her daughter and son; Hypertension in her daughter and son; Other in her daughter. SOCIAL HISTORY:  reports that she has never smoked. Her smokeless tobacco use includes Snuff. She reports that she does not drink alcohol or use illicit drugs.  REVIEW OF SYSTEMS:   Constitutional:+low grade fever, chills, weight loss, malaise/fatigue+ and diaphoresis.  HENT: Negative for hearing loss, ear pain, nosebleeds, congestion, sore throat, neck pain, tinnitus and ear discharge.   Eyes: Negative for blurred vision, double vision, photophobia, pain, discharge and redness.  Respiratory: Negative for cough, hemoptysis, sputum production, + shortness of breath, wheezing and stridor.  + pleuritic type CP on right, worse when she lays down  Cardiovascular: Negative for chest pain, palpitations, orthopnea, claudication, ++leg swelling and PND.  Gastrointestinal: Negative for heartburn, nausea, vomiting, abdominal pain, diarrhea, constipation, blood in stool and melena.  Genitourinary: Negative for dysuria, urgency, frequency, hematuria and flank pain.  Musculoskeletal: Negative for myalgias, back pain, joint pain and falls.  Skin: Negative for itching and rash.  Neurological: Negative for dizziness, tingling, tremors, sensory change, speech change, focal weakness, seizures, loss of consciousness, weakness and headaches.  Endo/Heme/Allergies: Negative for environmental allergies and polydipsia. Does not bruise/bleed easily.  SUBJECTIVE:  SOB  VITAL SIGNS: Temp:  [98.1 F (36.7 C)] 98.1 F (36.7 C) (04/22 0933) Pulse Rate:  [56-70] 62 (04/22 1330) Resp:  [19-27] 19 (04/22 1330) BP: (112-135)/(53-63) 112/57 mmHg (04/22 1330) SpO2:  [98 %-100 %] 98 % (04/22 1330) Weight:  [77.111 kg (170 lb)] 77.111 kg (170 lb) (04/22 1023)  PHYSICAL EXAMINATION: General:  81 year old female, currently in no distress on O2 Neuro:  Awake, alert,  oriented. No focal def  HEENT:  [place], No JVD  Cardiovascular:  Reg irreg + IV/VI murmur c/w AS Lungs:  Crackles left base, decreased on right  Abdomen:  Soft, non-tender + bowel sounds  Musculoskeletal:  Intact  Skin:  3+ LE edema. Chronic venous changes    Recent Labs Lab 07/11/14 1102 07/14/14 1125  NA 137 142  K 3.7 3.8  CL  --  105  CO2  --  24  BUN  --  47*  CREATININE  --  2.69*  GLUCOSE 129* 121*    Recent Labs Lab 07/11/14 1102 07/14/14 1125  HGB 9.2* 9.6*  HCT 27.0* 30.8*  WBC  --  9.9  PLT  --  165   Dg Chest 2 View  07/14/2014   CLINICAL DATA:  Shortness of breath and decreased oxygen saturation.  EXAM: CHEST - 2 VIEW  COMPARISON:  05/13/2014  FINDINGS: There is increase in a moderate right pleural effusion with associated atelectasis of the right lung. Mild interstitial edema present. The heart size is stable. No pneumothorax. The visualized skeletal structures are unremarkable.  IMPRESSION: Increase in moderate right pleural effusion with associated atelectasis of the right lung. Mild interstitial edema.   Electronically Signed   By: Aletta Edouard M.D.   On: 07/14/2014 10:12   Large right  effusion w/ tracheal shift to left   ASSESSMENT / PLAN:  Hypoxia  In setting of Large right Pleural effusion Diastolic HF AS AF HTN  CKD stage IV Biliary obstruction s/p recent ERCP 4/19  Discussion.  She has a large right pleural effusion which is almost certainly responsible for her symptoms.Highest on  D dx  include decompensated Diastolic HF after recent fluid loads OR potentially sympathetic after recent ERCP.   Plan Will go ahead w/ diagnostic/therapeutic thoracentesis.  Further recommendations will depend on pleural analysis   Erick Colace ACNP-BC [place] Pager # [phone] OR # [phone] if no answer  Pleural effusion: suspect related to colon cancer due to size..  - Perform thoracentesis   - Send fluid for analysis.  - Fluid  for cytology.  Hypoxemic respiratory failure: due to massive pleural effusion  - Thora.  - Supplemental O2 as ordered.  - Titrate O2 for sat of 88-92%.  CHF by history:   - 2D echo ordered.  - Limit IVF as able.  CRI: baseline Cr unknown.  - Follow BMET.  - Gentle hydration only.  - Replace electrolytes as indicated.  ?of lung mass underneath the effusion.  - CT of the chest without contrast post thora ordered.  - Will f/u for ?of lung mass but realistically nothing to be done about that at this stage.  EOL: patient wishes to be DNR.  DNR order placed.  Patient seen and examined, agree with above note.  I dictated the care and orders written for this patient under my direction.  Rush Farmer, MD [phone]  PCCM will continue to follow.   07/14/2014, 2:52 PM

## 2014-07-14 NOTE — ED Notes (Addendum)
Thoracentesis tray and ultrasound setup at bedside for [facility] NP

## 2014-07-14 NOTE — ED Notes (Signed)
Pt transferred to CT post-thoracentesis

## 2014-07-14 NOTE — ED Notes (Signed)
Pt. Having sob since her Endoscopy procedure on the 19th.   Pt. Denies any pain or chest pain.  Pt. Placed on 4l oxygen via paramedics. 100%, If pt. Talks or move sats decreased to 88- 89%   Pt.  Reports having rt. Lateral rib pain with inspiration.  Pt. Has decreased breath sounds on the rt. Lower lobe.   Pt. Is alert and oriented X4, GCs15.  Pt. Is weak, Skin is warm, dry and pink

## 2014-07-14 NOTE — ED Notes (Signed)
Updated plan of care with pt.s family.  They all verbalized understanding

## 2014-07-14 NOTE — ED Notes (Signed)
Updating pt. On plan of care by Dr. Betsey Holiday

## 2014-07-14 NOTE — Procedures (Signed)
Thoracentesis Procedure Note  Pre-operative Diagnosis: right effusion   Post-operative Diagnosis: same  Indications: dyspnea and evaluation   Procedure Details  Consent: Informed consent was obtained. Risks of the procedure were discussed including: infection, bleeding, pain, pneumothorax.  Under sterile conditions the patient was positioned. Betadine solution and sterile drapes were utilized.  1% buffered lidocaine was used to anesthetize the pleural space. Fluid was obtained without any difficulties and minimal blood loss.  A dressing was applied to the wound and wound care instructions were provided.   Findings 1500 ml of cloudy pleural fluid was obtained. A sample was sent to Pathology for cytogenetics, flow, and cell counts, as well as for infection analysis.  Complications:  None; patient tolerated the procedure well.          Condition: stable  Plan A follow up chest x-ray was ordered; but d/c'd as she was going for non-contrasted CT chest  Bed Rest for 0 hours. Tylenol 650 mg. for pain.

## 2014-07-14 NOTE — H&P (Signed)
[facility] History and Physical                                                                                    [name], is a 81 y.o. female  MRN: [id number]   DOB - March 22, 1926  Admit Date - 07/14/2014  Outpatient Primary MD for the patient is FRIED, [name], MD  Referring Physician:  Dr. Betsey Holiday, EDP  Chief Complaint:   Chief Complaint  Patient presents with  . Shortness of Breath     HPI  [name]  is a 81 y.o. female, with a complex past medical history including chronic kidney disease (baseline creatinine 2.3-2.6), heart failure with pulmonary hypertension (last EF in February 2016 was 65-70%), diabetes mellitus, moderate aortic stenosis, atrial fibrillation (not on Coumadin), colon cancer 2 status post colostomy, and recent acalculous cholecystitis status post perc drain placement. She comes to the ER today with shortness of breath. [name] reports that she had an ERCP by Dr. Deatra Ina on 4/19. Since the procedure she has had difficulty breathing. She cannot sleep at night.  She uses her abdominal muscles to help her breathe.  She does have slight nonproductive cough and left-sided sore throat. She also complains of low-grade fever. She typically urinates well but has had some difficulty with that today. She typically has lower extremity swelling, but her daughters report that its worse than usual today.  In the emergency department she was found to be mildly hypoxic 88% on room air. She is short of breath with speaking or movement. Her labs appear to be at baseline. Chest x-ray shows an enlarged right-sided moderate pleural effusion.  Review of Systems   In addition to the HPI above,  No Headache, No changes with Vision or hearing, No problems swallowing food or Liquids, No Chest pain No Abdominal pain, No Nausea or Vomiting, Bowel movements are regular, No Blood in stool or Urine, No new joints pains-aches,  No new weakness, tingling, numbness in any  extremity, No recent weight gain or loss, A full 10 point Review of Systems was done, except as stated above, all other Review of Systems were negative.  Past Medical History  Past Medical History  Diagnosis Date  . CKD (chronic kidney disease), stage IV     a. Baseline Cr reported 2.5-2.6.  Marland Kitchen Anxiety   . Diabetes mellitus     diabetic retinopathy  . Anemia   . Moderate aortic stenosis     a. By echo 10/2013.  Marland Kitchen Urinary tract infection   . Fall at home   . Colostomy care     colon ca - h/o bloody output from bag 12/2012 (ED visit)  . Colon cancer 30 year ago  . Chronic atrial fibrillation     a. not on anticoagulation due to increased risk of falls, advanced age.  . Chronic diastolic CHF (congestive heart failure)     a. Echo 10/2013: mild LVH, EF 60-65%, mod AS, mildly dilated LA, mod dilated RA, PASP 33.  Marland Kitchen Hypertension   . Carotid disease, bilateral     a. Carotid duplex 10/4130: 44-01% RICA/LICA.  Marland Kitchen H/O cardiovascular stress test  a. 2002: nuc negative for ischemia, EF 65%.    Past Surgical History  Procedure Laterality Date  . Abdominal surgery    . Abdominal hysterectomy    . Colon surgery    . Appendectomy    . Colostomy    . Cardioversion  09/30/2011    Procedure: CARDIOVERSION;  Surgeon: [name], MD;  Location: [facility];  Service: Cardiovascular;  Laterality: N/A;  . I&d extremity  11/20/2011    Procedure: IRRIGATION AND DEBRIDEMENT EXTREMITY;  Surgeon: Alta Corning, MD;  Location: [place];  Service: Orthopedics;  Laterality: Left;  . Av fistula placement Left 06/02/2012    Procedure: ARTERIOVENOUS (AV) FISTULA CREATION;  Surgeon: Conrad [place], MD;  Location: [facility];  Service: Vascular;  Laterality: Left;  Bascilic Fistula  . Bascilic vein transposition Left 08/18/2012    Procedure: BASCILIC VEIN TRANSPOSITION;  Surgeon: Conrad [place], MD;  Location: [facility];  Service: Vascular;  Laterality: Left;  Left 2nd stage Basilic Vein Transposition   . Ercp N/A 07/11/2014     Procedure: ENDOSCOPIC RETROGRADE CHOLANGIOPANCREATOGRAPHY (ERCP);  Surgeon: Inda Castle, MD;  Location: [facility];  Service: Endoscopy;  Laterality: N/A;  . Biliary stent placement N/A 07/11/2014    Procedure: BILIARY STENT PLACEMENT;  Surgeon: Inda Castle, MD;  Location: [facility];  Service: Endoscopy;  Laterality: N/A;      Social History History  Substance Use Topics  . Smoking status: Never Smoker   . Smokeless tobacco: Current User    Types: Snuff  . Alcohol Use: No   lives with her daughter [name]. Ambulates with a walker  Family History Family History  Problem Relation Age of Onset  . Heart disease Father   . Diabetes Sister   . Diabetes Daughter   . Hyperlipidemia Daughter   . Hypertension Daughter   . Other Daughter     varicose veins  . Cancer Son   . Diabetes Son   . Heart disease Son     before age 46  . Hyperlipidemia Son   . Hypertension Son    [name] has COPD and is a heavy smoker.  Prior to Admission medications   Medication Sig Start Date End Date Taking? Authorizing Provider  aspirin EC 81 MG tablet Take 81 mg by mouth daily.   Yes Historical Provider, MD  calcitRIOL (ROCALTROL) 0.25 MCG capsule Take 0.25 mcg by mouth daily.  04/06/12  Yes Historical Provider, MD  carvedilol (COREG) 25 MG tablet TAKE 1 TABLET (25 MG TOTAL) BY MOUTH 2 (TWO) TIMES DAILY WITH A MEAL. 03/13/14  Yes [name], MD  clotrimazole-betamethasone (LOTRISONE) lotion Apply 1 application topically daily as needed (apply to rash on stomach as needed).  05/25/14  Yes Historical Provider, MD  docusate sodium (COLACE) 100 MG capsule Take 100 mg by mouth daily as needed for constipation.    Yes Historical Provider, MD  furosemide (LASIX) 80 MG tablet Take 80 mg by mouth every morning.  04/10/14  Yes Historical Provider, MD  HYDROcodone-acetaminophen (NORCO/VICODIN) 5-325 MG per tablet Take 1 tablet by mouth every 4 (four) hours as needed for moderate pain. 05/16/14  Yes Orson Eva,  MD  insulin glargine (LANTUS) 100 UNIT/ML injection Inject 8 Units into the skin at bedtime as needed (if cbg over 150).    Yes Historical Provider, MD  linagliptin (TRADJENTA) 5 MG TABS tablet Take 5 mg by mouth daily.    Yes Historical Provider, MD  LORazepam (ATIVAN) 0.5 MG  tablet Take 0.5 mg by mouth 2 (two) times daily.   Yes Historical Provider, MD  meclizine (ANTIVERT) 12.5 MG tablet Take 12.5 mg by mouth as needed for dizziness.  11/23/12  Yes Historical Provider, MD  mirtazapine (REMERON) 7.5 MG tablet Take 7.5 mg by mouth at bedtime.  09/28/12  Yes Historical Provider, MD  promethazine (PHENERGAN) 12.5 MG tablet Take 1 tablet (12.5 mg total) by mouth every 6 (six) hours as needed for nausea or vomiting. 03/14/14  Yes Ripudeep Krystal Eaton, MD  simvastatin (ZOCOR) 20 MG tablet Take 1 tablet (20 mg total) by mouth at bedtime. 04/28/14  Yes [name], MD  vitamin B-12 (CYANOCOBALAMIN) 1000 MCG tablet Take 1,000 mcg by mouth daily.   Yes Historical Provider, MD  acetaminophen (TYLENOL) 500 MG tablet Take 500 mg by mouth every 6 (six) hours as needed for mild pain.     Historical Provider, MD    Allergies  Allergen Reactions  . Levaquin [Levofloxacin] Nausea Only  . Codeine Nausea Only  . Keflet [Cephalexin] Nausea Only  . Morphine And Related Nausea Only  . Oxycodone Other (See Comments)    Abnormal behavior    Physical Exam  Vitals  Blood pressure 112/82, pulse 63, temperature 98.7 F (37.1 C), temperature source Oral, resp. rate 18, height 5\' 4"  (1.626 m), weight 77.6 kg (171 lb 1.2 oz), SpO2 97 %.   General: Chronically ill-appearing, elderly, frail female lying in bed in NAD.  Oxygen in place. Daughters at bedside.  Psych:  Normal affect and insight, Not Suicidal or Homicidal, Awake Alert, Oriented X 3.  Neuro:   No F.N deficits, ALL C.Nerves Intact, Strength 5/5 all 4 extremities, Sensation intact all 4 extremities.  ENT:  Ears and Eyes appear Normal, Conjunctivae clear, PER.  Moist oral mucosa without erythema or exudates. One small pink spot at approximately 2:00 in the back of her oropharynx.  Neck:  Supple, No lymphadenopathy appreciated  Respiratory:  Decreased breath sounds in the right lower quadrant, otherwise good air movement.  Cardiac:  RRR, positive 3/6 systolic murmur, bilateral 2+ pitting LE edema noted, no JVD.    Abdomen:  Positive bowel sounds, Soft, Non tender, Non distended,  No masses appreciate. Percutaneous cholecystostomy tube in place with approx 100 ml of fluid in the bag.  Colostomy in place with brown liquid stool in the bag.  Skin:  No Cyanosis, Normal Skin Turgor, No Skin Rash, slight resolving ecchymosis on the left side of her face  Extremities:  Able to move all 4. 5/5 strength in each,  no effusions.  Data Review  CBC  Recent Labs Lab 07/11/14 1102 07/14/14 1125  WBC  --  9.9  HGB 9.2* 9.6*  HCT 27.0* 30.8*  PLT  --  165  MCV  --  91.4  MCH  --  28.5  MCHC  --  31.2  RDW  --  14.8  LYMPHSABS  --  1.4  MONOABS  --  0.8  EOSABS  --  0.0  BASOSABS  --  0.0    Chemistries   Recent Labs Lab 07/11/14 1102 07/14/14 1125  NA 137 142  K 3.7 3.8  CL  --  105  CO2  --  24  GLUCOSE 129* 121*  BUN  --  47*  CREATININE  --  2.69*  CALCIUM  --  8.7  AST  --  15  ALT  --  8  ALKPHOS  --  117  BILITOT  --  0.6     Cardiac Enzymes  Recent Labs Lab 07/14/14 1125  TROPONINI 0.03    Urinalysis    Component Value Date/Time   COLORURINE YELLOW 07/14/2014 1314   APPEARANCEUR CLEAR 07/14/2014 1314   LABSPEC 1.009 07/14/2014 1314   PHURINE 5.0 07/14/2014 1314   GLUCOSEU NEGATIVE 07/14/2014 1314   HGBUR NEGATIVE 07/14/2014 1314   BILIRUBINUR NEGATIVE 07/14/2014 1314   KETONESUR NEGATIVE 07/14/2014 1314   PROTEINUR NEGATIVE 07/14/2014 1314   UROBILINOGEN 0.2 07/14/2014 1314   NITRITE NEGATIVE 07/14/2014 1314   LEUKOCYTESUR TRACE* 07/14/2014 1314    Imaging results:   Dg Chest 2 View  07/14/2014    CLINICAL DATA:  Shortness of breath and decreased oxygen saturation.  EXAM: CHEST - 2 VIEW  COMPARISON:  05/13/2014  FINDINGS: There is increase in a moderate right pleural effusion with associated atelectasis of the right lung. Mild interstitial edema present. The heart size is stable. No pneumothorax. The visualized skeletal structures are unremarkable.  IMPRESSION: Increase in moderate right pleural effusion with associated atelectasis of the right lung. Mild interstitial edema.   Electronically Signed   By: Aletta Edouard M.D.   On: 07/14/2014 10:12   Ct Chest Wo Contrast  07/14/2014   CLINICAL DATA:  81 year old female with right-sided pleural effusion.  EXAM: CT CHEST WITHOUT CONTRAST  TECHNIQUE: Multidetector CT imaging of the chest was performed following the standard protocol without IV contrast.  COMPARISON:  Multiple prior chest x-ray.  FINDINGS: Chest:  Unremarkable appearance of the superficial soft tissues of the chest wall.  No evidence of axillary adenopathy. No supraclavicular adenopathy. Unremarkable appearance of the thyroid.  Unremarkable appearance of the thoracic inlet.  Heart size borderline enlarged. Trace pericardial fluid/ thickening.  Calcifications of the left main, left anterior descending, circumflex, right coronary arteries.  Multiple mediastinal lymph nodes, none of these are enlarged by CT size criteria. Index lymph node in the subcarinal nodal station measures 8 mm in greatest short axial dimension.  The esophagus is gas-filled superiorly. Small hiatal hernia distally.  Calcifications of the thoracic aorta. No evidence of aneurysm or periaortic fluid. Maximum diameter of the AA ascending aorta measures 3.9 cm. Calcifications of the branch vessels and descending thoracic aorta.  Moderate right-sided pleural effusion. Small left-sided pleural effusion. There is associated atelectatic changes of the bilateral lungs.  No significant interlobular septal thickening. Trace fluid within  the right minor fissure an within the right major fissure.  Linear and ground-glass opacities at the bilateral lung bases.  Airways are patent without evidence of debris within the airways.  Unremarkable appearance of the upper abdomen.  Partial visualization of left abdominal hernia, present on the comparison CT dated 09/04/2008.  Musculoskeletal:  No displaced fracture.  Sclerotic appearance of the vertebral bodies, may be related to renal dysfunction. Multilevel degenerative changes of the thoracic spine.  IMPRESSION: Moderate right-sided pleural effusion with small left-sided pleural effusion. Associated compressive atelectasis. Etiology is uncertain, as there is no evidence of overt pulmonary edema, and there is no evidence of lobar pneumonia. Mixed linear and ground-glass opacities of the bilateral lungs favored to represent atelectasis/ scarring.  Atherosclerosis with left main and 3 vessel coronary artery disease.  Additional findings as above.  Signed,  Dulcy Fanny. Earleen Newport, DO  [facility]  [facility]   Electronically Signed   By: Corrie Mckusick D.O.   On: 07/14/2014 16:43   Dg Ercp Biliary & Pancreatic Ducts  07/11/2014   CLINICAL DATA:  Biliary  stone  EXAM: ERCP  TECHNIQUE: Multiple spot images obtained with the fluoroscopic device and submitted for interpretation post-procedure.  : COMPARISON:  Cholangiography 06/13/2014  FINDINGS: Two intraprocedural images demonstrate retrograde cholangiography showing a persistent ovoid stone in the dilated distal common bile duct. A percutaneous cholecystostomy tube is noted. Contrast increased around the percutaneous cholecystostomy tube consistent with cystic duct patency. Fluoroscopy of a balloon sweep is documented.  IMPRESSION: 1. Choledocholithiasis and stone manipulation. 2. Patent cystic duct.  These images were submitted for radiologic interpretation only. Please see the procedural report for the amount of  contrast and the fluoroscopy time utilized.   Electronically Signed   By: Monte Fantasia M.D.   On: 07/11/2014 21:37    My personal review of EKG: atrial fibrillation, rate controlled.    Assessment & Plan  Principal Problem:   Acute respiratory failure with hypoxia Active Problems:   Anemia   Diabetes mellitus, type II   Hypertension   Chronic atrial fibrillation   Chronic diastolic CHF (congestive heart failure)   CKD (chronic kidney disease), stage IV   Moderate aortic stenosis   Type 2 diabetes mellitus with diabetic chronic kidney disease   Acute on chronic diastolic CHF (congestive heart failure)   Choledocholithiasis with acute cholecystitis   Pleural effusion, right   Pleural effusion   Lung mass   DNR (do not resuscitate)   Acute Respiratory Failure with Hypoxia Secondary to right pleural effusion. Will check CT chest (ordered by EDP) Pulmonology consulted.  Anticipate thoracentesis so I will not give antibiotics at this time in order to obtain cultures. Oxygen PRN.  DM II Hold oral medications.  Continue Lantus at home dose and add SSI - S  Chronic A Fib Rate controlled.  Not a candidate for anticoagulation due to falls.  CKD Creatinine at baseline.  (2.3 - 2.6)  Acute Cholecystitis in 04/2014. Perc Tube placed.  Site looks good.  Still draining. Underwent outpatient ERCP on 4/19.  Lipase is 38.  LFTs WNL. Will need surgery follow up outpatient.  Chronic back pain. Continue Hydrocodone at home dose.  Acute on chronic diastolic CHF Patient's legs appear swollen and tight. Last echo shows EF of 65 - 70 in 04/2014. BNP 526.1  (troponin 0.03) Continue 80 mg of lasix daily and give 40 mg IV lasix this evening.  Hypertension Continued COREG at lower dose as patient's pulse rate was 58. Continue lasix.  Hx of colon cancer x 2 S/p colostomy.  BM normal.   Consultants Called:  Pulmonology  Family Communication:   dtrs at bedside  Code Status:   DNR  Condition:  Guarded  Potential Disposition: Home vs rehab in estimated 3-4 days.  Time spent in minutes : [address],  PA-C on 07/14/2014 at 5:20 PM Between 7am to 7pm - Pager - [phone] After 7pm go to [email or web address] - password [email or web address] And look for the night coverage person covering me after hours  [facility]   Vital that on the morning

## 2014-07-14 NOTE — ED Provider Notes (Signed)
CSN: [id number]     Arrival date & time 07/14/14  5621 History   First MD Initiated Contact with Patient 07/14/14 [phone]     Chief Complaint  Patient presents with  . Shortness of Breath     (Consider location/radiation/quality/duration/timing/severity/associated sxs/prior Treatment) HPI Comments: Patient presents to the ER from home by EMS. Patient has been experiencing shortness of breath for the last 3 days. Patient does have a history of congestive heart failure, has increased swelling of her legs. There is no chest pain associated with her symptoms. Patient had outpatient ERCP 3 days ago for common bile duct stone. Since the procedure, she has been short of breath and symptoms are progressively worsening. Patient reports a sharp pain in her right side when she takes a deep breath.  Patient is a 81 y.o. female presenting with shortness of breath.  Shortness of Breath   Past Medical History  Diagnosis Date  . CKD (chronic kidney disease), stage IV     a. Baseline Cr reported 2.5-2.6.  Marland Kitchen Anxiety   . Diabetes mellitus     diabetic retinopathy  . Anemia   . Moderate aortic stenosis     a. By echo 10/2013.  Marland Kitchen Urinary tract infection   . Fall at home   . Colostomy care     colon ca - h/o bloody output from bag 12/2012 (ED visit)  . Colon cancer 30 year ago  . Chronic atrial fibrillation     a. not on anticoagulation due to increased risk of falls, advanced age.  . Chronic diastolic CHF (congestive heart failure)     a. Echo 10/2013: mild LVH, EF 60-65%, mod AS, mildly dilated LA, mod dilated RA, PASP 33.  Marland Kitchen Hypertension   . Carotid disease, bilateral     a. Carotid duplex 07/7844: 96-29% RICA/LICA.  Marland Kitchen H/O cardiovascular stress test     a. 2002: nuc negative for ischemia, EF 65%.   Past Surgical History  Procedure Laterality Date  . Abdominal surgery    . Abdominal hysterectomy    . Colon surgery    . Appendectomy    . Colostomy    . Cardioversion  09/30/2011    Procedure:  CARDIOVERSION;  Surgeon: [name], MD;  Location: [facility];  Service: Cardiovascular;  Laterality: N/A;  . I&d extremity  11/20/2011    Procedure: IRRIGATION AND DEBRIDEMENT EXTREMITY;  Surgeon: Alta [place], MD;  Location: [facility];  Service: Orthopedics;  Laterality: Left;  . Av fistula placement Left 06/02/2012    Procedure: ARTERIOVENOUS (AV) FISTULA CREATION;  Surgeon: Conrad [place], MD;  Location: [facility];  Service: Vascular;  Laterality: Left;  Bascilic Fistula  . Bascilic vein transposition Left 08/18/2012    Procedure: BASCILIC VEIN TRANSPOSITION;  Surgeon: Conrad Fairburn, MD;  Location: [facility];  Service: Vascular;  Laterality: Left;  Left 2nd stage Basilic Vein Transposition   . Ercp N/A 07/11/2014    Procedure: ENDOSCOPIC RETROGRADE CHOLANGIOPANCREATOGRAPHY (ERCP);  Surgeon: Inda Castle, MD;  Location: [facility];  Service: Endoscopy;  Laterality: N/A;  . Biliary stent placement N/A 07/11/2014    Procedure: BILIARY STENT PLACEMENT;  Surgeon: Inda Castle, MD;  Location: [facility];  Service: Endoscopy;  Laterality: N/A;   Family History  Problem Relation Age of Onset  . Heart disease Father   . Diabetes Sister   . Diabetes Daughter   . Hyperlipidemia Daughter   . Hypertension Daughter   . Other Daughter  varicose veins  . Cancer Son   . Diabetes Son   . Heart disease Son     before age 83  . Hyperlipidemia Son   . Hypertension Son    History  Substance Use Topics  . Smoking status: Never Smoker   . Smokeless tobacco: Current User    Types: Snuff  . Alcohol Use: No   OB History    No data available     Review of Systems  Respiratory: Positive for shortness of breath.   All other systems reviewed and are negative.     Allergies  Levaquin; Codeine; Keflet; Morphine and related; and Oxycodone  Home Medications   Prior to Admission medications   Medication Sig Start Date End Date Taking? Authorizing Provider  aspirin EC 81 MG tablet Take 81 mg by mouth  daily.   Yes Historical Provider, MD  calcitRIOL (ROCALTROL) 0.25 MCG capsule Take 0.25 mcg by mouth daily.  04/06/12  Yes Historical Provider, MD  carvedilol (COREG) 25 MG tablet TAKE 1 TABLET (25 MG TOTAL) BY MOUTH 2 (TWO) TIMES DAILY WITH A MEAL. 03/13/14  Yes [name], MD  clotrimazole-betamethasone (LOTRISONE) lotion Apply 1 application topically daily as needed (apply to rash on stomach as needed).  05/25/14  Yes Historical Provider, MD  docusate sodium (COLACE) 100 MG capsule Take 100 mg by mouth daily as needed for constipation.    Yes Historical Provider, MD  furosemide (LASIX) 80 MG tablet Take 80 mg by mouth every morning.  04/10/14  Yes Historical Provider, MD  HYDROcodone-acetaminophen (NORCO/VICODIN) 5-325 MG per tablet Take 1 tablet by mouth every 4 (four) hours as needed for moderate pain. 05/16/14  Yes Orson Eva, MD  insulin glargine (LANTUS) 100 UNIT/ML injection Inject 8 Units into the skin at bedtime as needed (if cbg over 150).    Yes Historical Provider, MD  linagliptin (TRADJENTA) 5 MG TABS tablet Take 5 mg by mouth daily.    Yes Historical Provider, MD  LORazepam (ATIVAN) 0.5 MG tablet Take 0.5 mg by mouth 2 (two) times daily.   Yes Historical Provider, MD  meclizine (ANTIVERT) 12.5 MG tablet Take 12.5 mg by mouth as needed for dizziness.  11/23/12  Yes Historical Provider, MD  mirtazapine (REMERON) 7.5 MG tablet Take 7.5 mg by mouth at bedtime.  09/28/12  Yes Historical Provider, MD  promethazine (PHENERGAN) 12.5 MG tablet Take 1 tablet (12.5 mg total) by mouth every 6 (six) hours as needed for nausea or vomiting. 03/14/14  Yes Ripudeep Krystal Eaton, MD  simvastatin (ZOCOR) 20 MG tablet Take 1 tablet (20 mg total) by mouth at bedtime. 04/28/14  Yes [name], MD  vitamin B-12 (CYANOCOBALAMIN) 1000 MCG tablet Take 1,000 mcg by mouth daily.   Yes Historical Provider, MD  acetaminophen (TYLENOL) 500 MG tablet Take 500 mg by mouth every 6 (six) hours as needed for mild pain.      Historical Provider, MD   BP 112/57 mmHg  Pulse 62  Temp(Src) 98.1 F (36.7 C) (Oral)  Resp 19  Ht 5\' 4"  (1.626 m)  Wt 170 lb (77.111 kg)  BMI 29.17 kg/m2  SpO2 98% Physical Exam  Constitutional: She is oriented to person, place, and time. She appears well-developed and well-nourished. No distress.  HENT:  Head: Normocephalic and atraumatic.  Right Ear: Hearing normal.  Left Ear: Hearing normal.  Nose: Nose normal.  Mouth/Throat: Oropharynx is clear and moist and mucous membranes are normal.  Eyes: Conjunctivae and EOM are  normal. Pupils are equal, round, and reactive to light.  Neck: Normal range of motion. Neck supple.  Cardiovascular: S1 normal and S2 normal.  An irregularly irregular rhythm present. Exam reveals no gallop and no friction rub.   Murmur heard.  Systolic murmur is present with a grade of 3/6  Pulmonary/Chest: Effort normal. No respiratory distress. She has decreased breath sounds in the right middle field and the right lower field. She exhibits no tenderness.  Abdominal: Soft. Normal appearance and bowel sounds are normal. There is no hepatosplenomegaly. There is no tenderness. There is no rebound, no guarding, no tenderness at McBurney's point and negative Murphy's sign. No hernia.  Musculoskeletal: Normal range of motion.  Neurological: She is alert and oriented to person, place, and time. She has normal strength. No cranial nerve deficit or sensory deficit. Coordination normal. GCS eye subscore is 4. GCS verbal subscore is 5. GCS motor subscore is 6.  Skin: Skin is warm, dry and intact. No rash noted. No cyanosis.  Psychiatric: She has a normal mood and affect. Her speech is normal and behavior is normal. Thought content normal.  Nursing note and vitals reviewed.   ED Course  Procedures (including critical care time) Labs Review Labs Reviewed  CBC WITH DIFFERENTIAL/PLATELET - Abnormal; Notable for the following:    RBC 3.37 (*)    Hemoglobin 9.6 (*)    HCT  30.8 (*)    All other components within normal limits  COMPREHENSIVE METABOLIC PANEL - Abnormal; Notable for the following:    Glucose, Bld 121 (*)    BUN 47 (*)    Creatinine, Ser 2.69 (*)    Albumin 2.7 (*)    GFR calc non Af Amer 15 (*)    GFR calc Af Amer 17 (*)    All other components within normal limits  URINALYSIS, ROUTINE W REFLEX MICROSCOPIC - Abnormal; Notable for the following:    Leukocytes, UA TRACE (*)    All other components within normal limits  BRAIN NATRIURETIC PEPTIDE - Abnormal; Notable for the following:    B Natriuretic Peptide 526.1 (*)    All other components within normal limits  URINE MICROSCOPIC-ADD ON - Abnormal; Notable for the following:    Squamous Epithelial / LPF FEW (*)    All other components within normal limits  LIPASE, BLOOD  TROPONIN I  I-STAT CG4 LACTIC ACID, ED    Imaging Review Dg Chest 2 View  07/14/2014   CLINICAL DATA:  Shortness of breath and decreased oxygen saturation.  EXAM: CHEST - 2 VIEW  COMPARISON:  05/13/2014  FINDINGS: There is increase in a moderate right pleural effusion with associated atelectasis of the right lung. Mild interstitial edema present. The heart size is stable. No pneumothorax. The visualized skeletal structures are unremarkable.  IMPRESSION: Increase in moderate right pleural effusion with associated atelectasis of the right lung. Mild interstitial edema.   Electronically Signed   By: Aletta Edouard M.D.   On: 07/14/2014 10:12     EKG Interpretation   Date/Time:  Friday July 14 2014 09:29:15 EDT Ventricular Rate:  70 PR Interval:    QRS Duration: 97 QT Interval:  413 QTC Calculation: 446 R Axis:   115 Text Interpretation:  Atrial fibrillation Lateral infarct, age  indeterminate Anteroseptal infarct, age indeterminate No significant  change since last tracing Confirmed by POLLINA  MD, [place] [phone]) on  07/14/2014 9:39:53 AM      MDM   Final diagnoses:  Shortness of breath  Pleural  effusion, right    Patient presents to the ER for evaluation of shortness of breath. Patient has a complicated medical history. She does have a history of diastolic heart failure. She also has had recent hospitalizations for pneumonia. Most recently, however, she has been diagnosed with choledocholithiasis. She has a cholecystostomy in place. 3 days ago she underwent ERCP to remove the stone. Since then she has been expressing increased shortness of breath, especially with exertion. She has had increase of her legs.  X-ray shows large right-sided pleural effusion of unclear etiology. Family reports that she did have fluid in the right side of her chest when she had pneumonia in December, but this cleared with treatment of the pneumonia and with Lasix. She has never had thoracentesis performed. Patient will require hospitalization for further workup and management of large right-sided pleural effusion resulting in hypoxia with exertion.    Orpah Greek, MD 07/14/14 1436

## 2014-07-15 ENCOUNTER — Inpatient Hospital Stay (HOSPITAL_COMMUNITY): Payer: Medicare Other

## 2014-07-15 DIAGNOSIS — I5033 Acute on chronic diastolic (congestive) heart failure: Principal | ICD-10-CM

## 2014-07-15 DIAGNOSIS — I10 Essential (primary) hypertension: Secondary | ICD-10-CM

## 2014-07-15 DIAGNOSIS — I5032 Chronic diastolic (congestive) heart failure: Secondary | ICD-10-CM

## 2014-07-15 DIAGNOSIS — J90 Pleural effusion, not elsewhere classified: Secondary | ICD-10-CM

## 2014-07-15 LAB — COMPREHENSIVE METABOLIC PANEL
ALT: 6 U/L (ref 0–35)
ANION GAP: 10 (ref 5–15)
AST: 10 U/L (ref 0–37)
Albumin: 2.1 g/dL — ABNORMAL LOW (ref 3.5–5.2)
Alkaline Phosphatase: 97 U/L (ref 39–117)
BUN: 51 mg/dL — AB (ref 6–23)
CO2: 26 mmol/L (ref 19–32)
CREATININE: 2.9 mg/dL — AB (ref 0.50–1.10)
Calcium: 8.4 mg/dL (ref 8.4–10.5)
Chloride: 107 mmol/L (ref 96–112)
GFR calc Af Amer: 16 mL/min — ABNORMAL LOW (ref >90)
GFR calc non Af Amer: 14 mL/min — ABNORMAL LOW (ref >90)
Glucose, Bld: 99 mg/dL (ref 70–99)
POTASSIUM: 3.7 mmol/L (ref 3.5–5.1)
Sodium: 143 mmol/L (ref 135–145)
TOTAL PROTEIN: 5.4 g/dL — AB (ref 6.0–8.3)
Total Bilirubin: 0.7 mg/dL (ref 0.3–1.2)

## 2014-07-15 LAB — CBC
HCT: 26.8 % — ABNORMAL LOW (ref 36.0–46.0)
Hemoglobin: 8.2 g/dL — ABNORMAL LOW (ref 12.0–15.0)
MCH: 28.3 pg (ref 26.0–34.0)
MCHC: 30.6 g/dL (ref 30.0–36.0)
MCV: 92.4 fL (ref 78.0–100.0)
PLATELETS: 184 10*3/uL (ref 150–400)
RBC: 2.9 MIL/uL — ABNORMAL LOW (ref 3.87–5.11)
RDW: 15 % (ref 11.5–15.5)
WBC: 7.7 10*3/uL (ref 4.0–10.5)

## 2014-07-15 LAB — GLUCOSE, CAPILLARY
GLUCOSE-CAPILLARY: 192 mg/dL — AB (ref 70–99)
Glucose-Capillary: 101 mg/dL — ABNORMAL HIGH (ref 70–99)
Glucose-Capillary: 140 mg/dL — ABNORMAL HIGH (ref 70–99)

## 2014-07-15 LAB — LACTATE DEHYDROGENASE: LDH: 109 U/L (ref 94–250)

## 2014-07-15 MED ORDER — CARVEDILOL 3.125 MG PO TABS
3.1250 mg | ORAL_TABLET | Freq: Two times a day (BID) | ORAL | Status: DC
  Administered 2014-07-16 – 2014-07-21 (×12): 3.125 mg via ORAL
  Filled 2014-07-15 (×13): qty 1

## 2014-07-15 MED ORDER — HYDROCODONE-ACETAMINOPHEN 5-325 MG PO TABS: 1.0000 | ORAL_TABLET | ORAL | Status: DC | PRN

## 2014-07-15 MED ORDER — INSULIN GLARGINE 100 UNIT/ML ~~LOC~~ SOLN
5.0000 [IU] | Freq: Every evening | SUBCUTANEOUS | Status: DC | PRN
  Filled 2014-07-15: qty 0.05

## 2014-07-15 MED ORDER — ATORVASTATIN CALCIUM 40 MG PO TABS
40.0000 mg | ORAL_TABLET | Freq: Every day | ORAL | Status: DC
  Administered 2014-07-15 – 2014-07-24 (×10): 40 mg via ORAL
  Filled 2014-07-15 (×11): qty 1

## 2014-07-15 MED ORDER — POLYETHYLENE GLYCOL 3350 17 G PO PACK
17.0000 g | PACK | Freq: Every day | ORAL | Status: DC
  Administered 2014-07-15 – 2014-07-17 (×3): 17 g via ORAL
  Filled 2014-07-15 (×11): qty 1

## 2014-07-15 NOTE — Progress Notes (Signed)
Pleural fluid labs reviewed.  Cultures and cytology pending.  Minimal exudate by total protein, but obviously not overly inflammatory.  4500 WBC's with 72% neutrophils, but again the chemistries are not overly inflammatory like one would see with complicated parapneumonic effusion or empyema.  CXR today with decreased fluid, but persistent compressive atx due to effusion vs ?infiltrate. Wonder if this is all a transdiaphragmatic process from GI issues vs simple parapneumonic effusion?  Suggest: 1) continue diuresis as renal function and BP can tolerate. 2) consider empiric abx to cover abd process and possible HCAP 3) followup cxr on Monday, and we will see on that day. 4)followup cultures on pleural fluid.   Please call if issues develop

## 2014-07-15 NOTE — Evaluation (Signed)
Physical Therapy Evaluation Patient Details Name: [name] MRN: [id number] DOB: 05/31/1925 Today's Date: 07/15/2014   History of Present Illness  Patient presents to the ER from home by EMS. Patient has been experiencing shortness of breath for the last 3 days. Patient does have a history of congestive heart failure, has increased swelling of her legs. Recently underwent ERCP On 4/19 for retained bile duct stones and has cystostomy tube.   Clinical Impression  Pt admitted with above diagnosis. Pt currently with functional limitations due to the deficits listed below (see PT Problem List). Pt will benefit from skilled PT to increase their independence and safety with mobility to allow discharge to the venue listed below.  Pt has all DME and discussed home therapies, but pt and family declined.  At this time, the plan is to d/c home with daughter who she has been staying with, but she wants to go back to her house.     Follow Up Recommendations No PT follow up    Equipment Recommendations  None recommended by PT    Recommendations for Other Services       Precautions / Restrictions Precautions Precautions: Other (comment) Precaution Comments: cystostomy drainage bag Restrictions Weight Bearing Restrictions: No      Mobility  Bed Mobility Overal bed mobility: Needs Assistance Bed Mobility: Supine to Sit     Supine to sit: Supervision;HOB elevated     General bed mobility comments: Heavy reliance on rail  Transfers Overall transfer level: Needs assistance Equipment used: Rolling walker (2 wheeled) Transfers: Sit to/from Stand Sit to Stand: Supervision            Ambulation/Gait Ambulation/Gait assistance: Min guard Ambulation Distance (Feet): 70 Feet Assistive device: Rolling walker (2 wheeled)       General Gait Details: Amb on room air and o2 sats 95%.  Pt did well with 2WRW, although she normally amb with 4WRW.  Stairs            Wheelchair  Mobility    Modified Rankin (Stroke Patients Only)       Balance Overall balance assessment: No apparent balance deficits (not formally assessed)                                           Pertinent Vitals/Pain Pain Assessment: No/denies pain    Home Living Family/patient expects to be discharged to:: Private residence Living Arrangements: Children Available Help at Discharge: Family;Available 24 hours/day Type of Home: House Home Access: Stairs to enter Entrance Stairs-Rails: Left;Right;Can reach both Entrance Stairs-Number of Steps: 5   Home Equipment: Walker - 2 wheels;Walker - 4 wheels;Bedside commode;Shower seat;Cane - single point      Prior Function Level of Independence: Independent with assistive device(s)         Comments: Uses rollator.      Hand Dominance   Dominant Hand: Right    Extremity/Trunk Assessment   Upper Extremity Assessment: Overall WFL for tasks assessed           Lower Extremity Assessment: Overall WFL for tasks assessed      Cervical / Trunk Assessment: Normal  Communication   Communication: No difficulties  Cognition Arousal/Alertness: Awake/alert Behavior During Therapy: WFL for tasks assessed/performed Overall Cognitive Status: Within Functional Limits for tasks assessed  General Comments General comments (skin integrity, edema, etc.): Pt is concerned about cystoscopy drainage bag.    Exercises        Assessment/Plan    PT Assessment Patient needs continued PT services  PT Diagnosis Difficulty walking   PT Problem List Decreased mobility;Decreased activity tolerance;Decreased knowledge of use of DME;Cardiopulmonary status limiting activity  PT Treatment Interventions Gait training;Stair training;Functional mobility training;Therapeutic activities;Therapeutic exercise;DME instruction   PT Goals (Current goals can be found in the Care Plan section) Acute Rehab PT  Goals Patient Stated Goal: Go home PT Goal Formulation: With patient/family Time For Goal Achievement: 07/29/14 Potential to Achieve Goals: Good    Frequency Min 3X/week   Barriers to discharge        Co-evaluation               End of Session Equipment Utilized During Treatment: Gait belt Activity Tolerance: Patient tolerated treatment well Patient left: in chair;with call bell/phone within reach;with family/visitor present Nurse Communication: Mobility status;Other (comment) (02 sats)         Time: 9518-8416 PT Time Calculation (min) (ACUTE ONLY): 20 min   Charges:   PT Evaluation $Initial PT Evaluation Tier I: 1 Procedure     PT G Codes:        Odis Turck LUBECK 07/15/2014, 10:21 AM

## 2014-07-15 NOTE — Progress Notes (Signed)
[facility] PROGRESS NOTE  [name] HCW:[id number] DOB: [birth year] DOA: 07/14/2014 PCP: Abigail Miyamoto, MD  Brief Summary  [name] is a 81 y.o. female, with a complex past medical history including chronic kidney disease (baseline creatinine 2.3-2.6), chronic diastolic heart failure with pulmonary hypertension (last EF in February 2016 was 65-70%), diabetes mellitus, moderate to severe aortic stenosis, atrial fibrillation (not on Coumadin), colon cancer 2 status post colostomy, and recent acalculous cholecystitis status post perc drain placement. She comes to the ER today with shortness of breath. [name] reports that she had an ERCP by Dr. Deatra Ina on 4/19. Since the procedure she has had difficulty breathing. She cannot sleep at night. She uses her abdominal muscles to help her breathe. She does have slight nonproductive cough and left-sided sore throat. She also complains of low-grade fever. She typically urinates well but has had some difficulty with that today. She typically has lower extremity swelling, but her daughters report that its worse than usual today.  In the emergency department she was found to be mildly hypoxic 88% on room air. She is Corderro Koloski of breath with speaking or movement. Her labs appear to be at baseline. Chest x-ray shows an enlarged right-sided moderate pleural effusion.  Assessment/Plan  Acute hypoxic respiratory failure secondary to RIGHT exudative pleural effusion.   -  Underwent thoracentesis on 4/22 with removal of 1.5L cloudy pleural fluid by pulmonology  -  Exudative by light's criteria by total protein and LDH -   F/u CT chest demonstrated moderate right sided pleural effusion with small left pleural effusion, no overt edema or lobar pneumonia.  Possible atelectasis bilaterally -  CXR 4/23 with persistent effusion and atelectasis vs. Consolidation -  Patient feels better and not having fevers so will defer antibiotics for now -  Continue OOB  -   Start IS -  F/u cytology and pleural fluid cultures -  Wean oxygen as tolerated  DM II 05/10/2014 hemoglobin A1c 6.8, morning CBG was low normal -  Hold oral medications.  -  Decrease Lantus at home dose and add SSI - S  Chronic A Fib, CHADS-VASc= 5 -  Rate controlled with some 2.5 second pauses -  Not a candidate for anticoagulation due to falls.  CAD based on CT scan 4/22 -   Continue aspirin, BB, change to high dose statin  CKD stage IV, baseline creatinine 2.3 - 2.6 -  Creatinine trending up slightly with diuresis -  Minimize nephrotoxins and renally dose medications  Acute Cholecystitis in 04/2014. -  Perc Tube placed and draining clear fluid -  Underwent outpatient ERCP on 4/19. Lipase is 38. LFTs WNL. -  Will need surgery follow up outpatient.  Chronic back pain. -  Continue Hydrocodone at home dose.  Acute on chronic diastolic CHF, still with significant LEE -  Last echo shows EF of 65 - 70 in 04/2014. -  BNP 526.1 (troponin 0.03) -  Continue 80 mg of lasix daily   Hypertension now with hypotension -  Decrease carvedilol to 3.125mg  BID and place hold parameter  Hx of colon cancer 2002 s/p right hemicolectomy and colostomy, BM firm -  Start miralax  Normocytic anemia, previously iron deficient -  Iron studies, b12, folate, tsh - hgb trendign down -  Repeat hgb in AM  Diet:  diabetic Access:  PIV IVF:  off Proph:  heparin  Code Status: DNR Family Communication: patient and her two daughters Disposition Plan:  Pending culture data, repeat CXR  on Monday, continuing diuresis   Consultants:  Pulmonology  Procedures:  Thoracentesis 4/22 with removal of 1.5L cloudy pleural fluid  Antibiotics:  none  HPI/Subjective:  Was OOB ambulating with improvement in her SOB.  Feels better with oxygen off.  Has pleuritic chest pains   Objective: Filed Vitals:   07/15/14 0510 07/15/14 0823 07/15/14 0900 07/15/14 1300  BP: 103/36 107/86 92/37 105/45   Pulse: 61 68 70 72  Temp: 98.2 F (36.8 C)  98.7 F (37.1 C) 98.2 F (36.8 C)  TempSrc: Oral  Oral Oral  Resp: 18  18 18   Height:      Weight: 77.2 kg (170 lb 3.1 oz)     SpO2: 96% 100% 100% 95%    Intake/Output Summary (Last 24 hours) at 07/15/14 1534 Last data filed at 07/15/14 1300  Gross per 24 hour  Intake   1120 ml  Output   2075 ml  Net   -955 ml   Filed Weights   07/14/14 1023 07/14/14 1642 07/15/14 0510  Weight: 77.111 kg (170 lb) 77.6 kg (171 lb 1.2 oz) 77.2 kg (170 lb 3.1 oz)    Exam:   General:  Average weight female, No acute distress  HEENT:  NCAT, MMM  Cardiovascular:  RRR, nl S1, S2, 3/6 systolic murmur at the left lateral chest with radiation throughout precordium, 2+ pulses, warm extremities  Respiratory:  Diminished on the right to the mid-back with rales, diminished at left base, no wheezes, no increased WOB  Abdomen:   NABS, soft, ND, NT, RUQ with drain without surrounding erythema, clear brown fluid in bag, left sided colostomy with firm stool  MSK:   Normal tone and bulk, 1+ bilateral pitting LEE  Neuro:  Grossly intact  Data Reviewed: Basic Metabolic Panel:  Recent Labs Lab 07/11/14 1102 07/14/14 1125 07/15/14 0505  NA 137 142 143  K 3.7 3.8 3.7  CL  --  105 107  CO2  --  24 26  GLUCOSE 129* 121* 99  BUN  --  47* 51*  CREATININE  --  2.69* 2.90*  CALCIUM  --  8.7 8.4   Liver Function Tests:  Recent Labs Lab 07/14/14 1125 07/15/14 0505  AST 15 10  ALT 8 6  ALKPHOS 117 97  BILITOT 0.6 0.7  PROT 6.4 5.4*  ALBUMIN 2.7* 2.1*    Recent Labs Lab 07/14/14 1125  LIPASE 38   No results for input(s): AMMONIA in the last 168 hours. CBC:  Recent Labs Lab 07/11/14 1102 07/14/14 1125 07/15/14 0505  WBC  --  9.9 7.7  NEUTROABS  --  7.6  --   HGB 9.2* 9.6* 8.2*  HCT 27.0* 30.8* 26.8*  MCV  --  91.4 92.4  PLT  --  165 184   Cardiac Enzymes:  Recent Labs Lab 07/14/14 1125  TROPONINI 0.03   BNP (last 3  results)  Recent Labs  03/15/14 0914 07/14/14 1125  BNP 941.4* 526.1*    ProBNP (last 3 results) No results for input(s): PROBNP in the last 8760 hours.  CBG:  Recent Labs Lab 07/15/14 0554 07/15/14 1127  GLUCAP 101* 140*    No results found for this or any previous visit (from the past 240 hour(s)).   Studies: Dg Chest 2 View  07/15/2014   CLINICAL DATA:  81 year old female with shortness of breath.  EXAM: CHEST - 2 VIEW  COMPARISON:  Chest x-ray 07/14/2014, 05/13/2014, CT 07/14/2014  FINDINGS: Cardiomediastinal silhouette likely unchanged though partially  obscured by overlying lung/pleural disease.  Atherosclerotic calcifications of the aortic arch.  Improved aeration at the superior right lung.  No pneumothorax.  Persisting interstitial and airspace opacities at the right mid and lower lung with obscuration of the right hemidiaphragm. Fluid within the minor fissure. Partial obscuration left hemidiaphragm.  Osteopenia with degenerative changes.  No displaced fracture.  Vascular calcifications of the abdominal aorta.  IMPRESSION: Persisting right-sided pleural effusion with associated atelectatic changes, and improved aeration at the superior right lung. Airspace opacities on the right base may reflect atelectasis and/ consolidation.  Left lung is relatively well aerated.  Atherosclerosis.  Signed,  Dulcy Fanny. Earleen Newport, DO  [facility]  [facility]   Electronically Signed   By: Corrie Mckusick D.O.   On: 07/15/2014 07:06   Dg Chest 2 View  07/14/2014   CLINICAL DATA:  Shortness of breath and decreased oxygen saturation.  EXAM: CHEST - 2 VIEW  COMPARISON:  05/13/2014  FINDINGS: There is increase in a moderate right pleural effusion with associated atelectasis of the right lung. Mild interstitial edema present. The heart size is stable. No pneumothorax. The visualized skeletal structures are unremarkable.  IMPRESSION: Increase in moderate right  pleural effusion with associated atelectasis of the right lung. Mild interstitial edema.   Electronically Signed   By: Aletta Edouard M.D.   On: 07/14/2014 10:12   Ct Chest Wo Contrast  07/14/2014   CLINICAL DATA:  81 year old female with right-sided pleural effusion.  EXAM: CT CHEST WITHOUT CONTRAST  TECHNIQUE: Multidetector CT imaging of the chest was performed following the standard protocol without IV contrast.  COMPARISON:  Multiple prior chest x-ray.  FINDINGS: Chest:  Unremarkable appearance of the superficial soft tissues of the chest wall.  No evidence of axillary adenopathy. No supraclavicular adenopathy. Unremarkable appearance of the thyroid.  Unremarkable appearance of the thoracic inlet.  Heart size borderline enlarged. Trace pericardial fluid/ thickening.  Calcifications of the left main, left anterior descending, circumflex, right coronary arteries.  Multiple mediastinal lymph nodes, none of these are enlarged by CT size criteria. Index lymph node in the subcarinal nodal station measures 8 mm in greatest Kendahl Bumgardner axial dimension.  The esophagus is gas-filled superiorly. Small hiatal hernia distally.  Calcifications of the thoracic aorta. No evidence of aneurysm or periaortic fluid. Maximum diameter of the AA ascending aorta measures 3.9 cm. Calcifications of the branch vessels and descending thoracic aorta.  Moderate right-sided pleural effusion. Small left-sided pleural effusion. There is associated atelectatic changes of the bilateral lungs.  No significant interlobular septal thickening. Trace fluid within the right minor fissure an within the right major fissure.  Linear and ground-glass opacities at the bilateral lung bases.  Airways are patent without evidence of debris within the airways.  Unremarkable appearance of the upper abdomen.  Partial visualization of left abdominal hernia, present on the comparison CT dated 09/04/2008.  Musculoskeletal:  No displaced fracture.  Sclerotic appearance  of the vertebral bodies, may be related to renal dysfunction. Multilevel degenerative changes of the thoracic spine.  IMPRESSION: Moderate right-sided pleural effusion with small left-sided pleural effusion. Associated compressive atelectasis. Etiology is uncertain, as there is no evidence of overt pulmonary edema, and there is no evidence of lobar pneumonia. Mixed linear and ground-glass opacities of the bilateral lungs favored to represent atelectasis/ scarring.  Atherosclerosis with left main and 3 vessel coronary artery disease.  Additional findings as above.  Signed,  Dulcy Fanny. Earleen Newport DO  [facility]  [facility]   Electronically Signed   By: Corrie Mckusick D.O.   On: 07/14/2014 16:43    Scheduled Meds: . aspirin EC  81 mg Oral Daily  . calcitRIOL  0.25 mcg Oral Daily  . carvedilol  12.5 mg Oral BID WC  . docusate sodium  100 mg Oral BID  . furosemide  80 mg Oral q morning - 10a  . heparin  5,000 Units Subcutaneous 3 times per day  . insulin aspart  0-9 Units Subcutaneous TID WC  . LORazepam  0.5 mg Oral BID  . mirtazapine  7.5 mg Oral QHS  . simvastatin  20 mg Oral QHS  . sodium chloride  3 mL Intravenous Q12H  . sodium chloride  3 mL Intravenous Q12H  . vitamin B-12  1,000 mcg Oral Daily   Continuous Infusions:   Principal Problem:   Acute respiratory failure with hypoxia Active Problems:   Anemia   Diabetes mellitus, type II   Hypertension   Chronic atrial fibrillation   Chronic diastolic CHF (congestive heart failure)   CKD (chronic kidney disease), stage IV   Moderate aortic stenosis   Type 2 diabetes mellitus with diabetic chronic kidney disease   Acute on chronic diastolic CHF (congestive heart failure)   Choledocholithiasis with acute cholecystitis   Pleural effusion, right   Pleural effusion   Lung mass   DNR (do not resuscitate)    Time spent: 30 min    Giomar Gusler, [facility] Pager [phone]. If  7PM-7AM, please contact night-coverage at [email or web address], password [facility] 07/15/2014, 3:34 PM  LOS: 1 day

## 2014-07-15 NOTE — Progress Notes (Signed)
Utilization review complete 

## 2014-07-16 ENCOUNTER — Other Ambulatory Visit: Payer: Self-pay | Admitting: Cardiology

## 2014-07-16 LAB — GLUCOSE, CAPILLARY
GLUCOSE-CAPILLARY: 178 mg/dL — AB (ref 70–99)
GLUCOSE-CAPILLARY: 138 mg/dL — AB (ref 70–99)
Glucose-Capillary: 131 mg/dL — ABNORMAL HIGH (ref 70–99)
Glucose-Capillary: 138 mg/dL — ABNORMAL HIGH (ref 70–99)
Glucose-Capillary: 124 mg/dL — ABNORMAL HIGH (ref 70–99)

## 2014-07-16 LAB — CBC
HCT: 26.2 % — ABNORMAL LOW (ref 36.0–46.0)
HEMOGLOBIN: 8.2 g/dL — AB (ref 12.0–15.0)
MCH: 28.9 pg (ref 26.0–34.0)
MCHC: 31.3 g/dL (ref 30.0–36.0)
MCV: 92.3 fL (ref 78.0–100.0)
Platelets: 174 10*3/uL (ref 150–400)
RBC: 2.84 MIL/uL — ABNORMAL LOW (ref 3.87–5.11)
RDW: 14.9 % (ref 11.5–15.5)
WBC: 9.4 10*3/uL (ref 4.0–10.5)

## 2014-07-16 LAB — BASIC METABOLIC PANEL
Anion gap: 11 (ref 5–15)
BUN: 56 mg/dL — AB (ref 6–23)
CO2: 23 mmol/L (ref 19–32)
CREATININE: 2.93 mg/dL — AB (ref 0.50–1.10)
Calcium: 8.3 mg/dL — ABNORMAL LOW (ref 8.4–10.5)
Chloride: 106 mmol/L (ref 96–112)
GFR calc Af Amer: 16 mL/min — ABNORMAL LOW (ref >90)
GFR calc non Af Amer: 14 mL/min — ABNORMAL LOW (ref >90)
GLUCOSE: 119 mg/dL — AB (ref 70–99)
POTASSIUM: 3.6 mmol/L (ref 3.5–5.1)
SODIUM: 140 mmol/L (ref 135–145)

## 2014-07-16 LAB — URINE CULTURE
COLONY COUNT: NO GROWTH
Culture: NO GROWTH

## 2014-07-16 NOTE — Progress Notes (Signed)
Drain Clamped per MD order.

## 2014-07-16 NOTE — Progress Notes (Addendum)
[facility] PROGRESS NOTE  JOYCIE AERTS EPP:[id number] DOB: [birth year] DOA: 07/14/2014 PCP: Abigail Miyamoto, MD  Brief Summary  Senetra Dillin is a 81 y.o. female, with a complex past medical history including chronic kidney disease (baseline creatinine 2.3-2.6), chronic diastolic heart failure with pulmonary hypertension (last EF in February 2016 was 65-70%), diabetes mellitus, moderate to severe aortic stenosis, atrial fibrillation (not on Coumadin), colon cancer 2 status post colostomy, and recent acalculous cholecystitis status post perc drain placement. She comes to the ER today with shortness of breath. Mrs. Khalid reports that she had an ERCP by Dr. Deatra Ina on 4/19. Since the procedure she has had difficulty breathing. She cannot sleep at night. She uses her abdominal muscles to help her breathe. She does have slight nonproductive cough and left-sided sore throat. She also complains of low-grade fever. She typically urinates well but has had some difficulty with that today. She typically has lower extremity swelling, but her daughters report that its worse than usual today.  In the emergency department she was found to be mildly hypoxic 88% on room air. She is Dorean Daniello of breath with speaking or movement. Her labs appear to be at baseline. Chest x-ray shows an enlarged right-sided moderate pleural effusion.  Assessment/Plan  Acute hypoxic respiratory failure secondary to RIGHT exudative pleural effusion likely caused by recent ERCP with stone extraction  -  Underwent thoracentesis on 4/22 with removal of 1.5L cloudy pleural fluid by pulmonology  -  Exudative by light's criteria by total protein and LDH -   F/u CT chest demonstrated moderate right sided pleural effusion with small left pleural effusion, no overt edema or lobar pneumonia.  Possible atelectasis bilaterally -  CXR 4/23 with persistent effusion and atelectasis vs. consolidation -  Patient feels better and not having fevers so  will defer antibiotics for now -  Continue OOB  -  Start IS -  F/u cytology  -  Pleural fluid culture NGTD -  ANA, RF pending -  Repeat CXR in AM  DM II 05/10/2014 hemoglobin A1c 6.8, CBG at goal -  Hold oral medications.  -  Decrease Lantus at home dose and continue SSI  Chronic A Fib, CHADS-VASc= 5 -  Rate controlled with some 2.5 second pauses -  Not a candidate for anticoagulation due to falls. -  D/c telemetry  CAD based on CT scan 4/22 -   Continue aspirin, BB, change to high dose statin  CKD stage IV, baseline creatinine 2.3 - 2.6 -  Creatinine trending up slightly with diuresis -  Minimize nephrotoxins and renally dose medications  Acute Cholecystitis in 04/2014. -  Perc Tube placed and draining clear fluid about 50ml/day -  Underwent outpatient ERCP on 4/19. Lipase is 38. LFTs WNL. -  Discussed case with Dr. Hilarie Fredrickson GI who recommended clamping cholecystostomy tube and repeating LFTs in AM -  GI to consult in AM for further management (and possible removal of) cholecystostomy tube  Chronic back pain. -  Continue Hydrocodone at home dose.  Acute on chronic diastolic CHF, persistent but improving LEE -  Last echo shows EF of 65 - 70 in 04/2014. -  BNP 526.1 (troponin 0.03) -  Continue 80 mg of lasix daily  -  Weight approximately stable -  Creatinine increased slightly  Hypertension, BP stable -  Continue carvedilol to 3.125mg  BID  Hx of colon cancer 2002 s/p right hemicolectomy and colostomy, BM firm -  Continue miralax  Normocytic anemia, previously iron deficient -  Iron studies, b12, folate -  TSH - hgb trendign down -  Repeat hgb in AM  Diet:  diabetic Access:  PIV IVF:  off Proph:  heparin  Code Status: DNR Family Communication: patient and her two daughters Disposition Plan:  Pending culture data, repeat CXR on Monday, continuing diuresis   Consultants:  Pulmonology  GI  Procedures:  Thoracentesis 4/22 with removal of 1.5L cloudy  pleural fluid  Antibiotics:  none  HPI/Subjective:  Persistent pleuritic chest pains but otherwise feels well.  Would like her cholecystostomy tube removed  Objective: Filed Vitals:   07/16/14 0456 07/16/14 0826 07/16/14 0900 07/16/14 1300  BP: 120/51 134/44 118/54 129/48  Pulse: 79 68 75 74  Temp: 98.7 F (37.1 C)  98 F (36.7 C) 98 F (36.7 C)  TempSrc: Oral  Oral Oral  Resp: 18  18 18   Height:      Weight: 77.747 kg (171 lb 6.4 oz)     SpO2: 93%  96% 97%    Intake/Output Summary (Last 24 hours) at 07/16/14 1600 Last data filed at 07/16/14 1400  Gross per 24 hour  Intake    840 ml  Output    475 ml  Net    365 ml   Filed Weights   07/14/14 1642 07/15/14 0510 07/16/14 0456  Weight: 77.6 kg (171 lb 1.2 oz) 77.2 kg (170 lb 3.1 oz) 77.747 kg (171 lb 6.4 oz)    Exam:   General:  Average weight female, No acute distress  HEENT:  NCAT, MMM  Cardiovascular:  RRR, nl S1, S2, 3/6 systolic murmur at the left lateral chest with radiation throughout precordium, 2+ pulses, warm extremities  Respiratory:  Diminished on the right to the mid-back with rales, diminished at left base, no wheezes, no increased WOB  Abdomen:   NABS, soft, ND, NT, RUQ with drain without surrounding erythema, clear brown fluid in bag  MSK:   Normal tone and bulk, 1+ bilateral pitting LEE  Neuro:  Grossly intact  Data Reviewed: Basic Metabolic Panel:  Recent Labs Lab 07/11/14 1102 07/14/14 1125 07/15/14 0505 07/16/14 0702  NA 137 142 143 140  K 3.7 3.8 3.7 3.6  CL  --  105 107 106  CO2  --  24 26 23   GLUCOSE 129* 121* 99 119*  BUN  --  47* 51* 56*  CREATININE  --  2.69* 2.90* 2.93*  CALCIUM  --  8.7 8.4 8.3*   Liver Function Tests:  Recent Labs Lab 07/14/14 1125 07/15/14 0505  AST 15 10  ALT 8 6  ALKPHOS 117 97  BILITOT 0.6 0.7  PROT 6.4 5.4*  ALBUMIN 2.7* 2.1*    Recent Labs Lab 07/14/14 1125  LIPASE 38   No results for input(s): AMMONIA in the last 168  hours. CBC:  Recent Labs Lab 07/11/14 1102 07/14/14 1125 07/15/14 0505 07/16/14 0702  WBC  --  9.9 7.7 9.4  NEUTROABS  --  7.6  --   --   HGB 9.2* 9.6* 8.2* 8.2*  HCT 27.0* 30.8* 26.8* 26.2*  MCV  --  91.4 92.4 92.3  PLT  --  165 184 174   Cardiac Enzymes:  Recent Labs Lab 07/14/14 1125  TROPONINI 0.03   BNP (last 3 results)  Recent Labs  03/15/14 0914 07/14/14 1125  BNP 941.4* 526.1*    ProBNP (last 3 results) No results for input(s): PROBNP in the last 8760 hours.  CBG:  Recent Labs Lab 07/15/14 1127 07/15/14  1642 07/15/14 2128 07/16/14 0600 07/16/14 1144  GLUCAP 140* 192* 178* 131* 138*    Recent Results (from the past 240 hour(s))  Urine culture     Status: None   Collection Time: 07/14/14  1:14 PM  Result Value Ref Range Status   Specimen Description URINE, RANDOM  Final   Special Requests NONE  Final   Colony Count NO GROWTH Performed at [facility]   Final   Culture NO GROWTH Performed at [facility]   Final   Report Status 07/16/2014 FINAL  Final  Body fluid culture     Status: None (Preliminary result)   Collection Time: 07/14/14  4:04 PM  Result Value Ref Range Status   Specimen Description FLUID RIGHT PLEURAL  Final   Special Requests Normal  Final   Gram Stain   Final    RARE WBC PRESENT, PREDOMINANTLY PMN NO ORGANISMS SEEN Performed at [facility]    Culture   Final    NO GROWTH 1 DAY Performed at [facility]    Report Status PENDING  Incomplete     Studies: Dg Chest 2 View  07/15/2014   CLINICAL DATA:  81 year old female with shortness of breath.  EXAM: CHEST - 2 VIEW  COMPARISON:  Chest x-ray 07/14/2014, 05/13/2014, CT 07/14/2014  FINDINGS: Cardiomediastinal silhouette likely unchanged though partially obscured by overlying lung/pleural disease.  Atherosclerotic calcifications of the aortic arch.  Improved aeration at the superior right lung.  No pneumothorax.  Persisting interstitial and  airspace opacities at the right mid and lower lung with obscuration of the right hemidiaphragm. Fluid within the minor fissure. Partial obscuration left hemidiaphragm.  Osteopenia with degenerative changes.  No displaced fracture.  Vascular calcifications of the abdominal aorta.  IMPRESSION: Persisting right-sided pleural effusion with associated atelectatic changes, and improved aeration at the superior right lung. Airspace opacities on the right base may reflect atelectasis and/ consolidation.  Left lung is relatively well aerated.  Atherosclerosis.  Signed,  Dulcy Fanny. Earleen Newport, DO  [facility]  [facility]   Electronically Signed   By: Corrie Mckusick D.O.   On: 07/15/2014 07:06   Ct Chest Wo Contrast  07/14/2014   CLINICAL DATA:  81 year old female with right-sided pleural effusion.  EXAM: CT CHEST WITHOUT CONTRAST  TECHNIQUE: Multidetector CT imaging of the chest was performed following the standard protocol without IV contrast.  COMPARISON:  Multiple prior chest x-ray.  FINDINGS: Chest:  Unremarkable appearance of the superficial soft tissues of the chest wall.  No evidence of axillary adenopathy. No supraclavicular adenopathy. Unremarkable appearance of the thyroid.  Unremarkable appearance of the thoracic inlet.  Heart size borderline enlarged. Trace pericardial fluid/ thickening.  Calcifications of the left main, left anterior descending, circumflex, right coronary arteries.  Multiple mediastinal lymph nodes, none of these are enlarged by CT size criteria. Index lymph node in the subcarinal nodal station measures 8 mm in greatest Joelly Bolanos axial dimension.  The esophagus is gas-filled superiorly. Small hiatal hernia distally.  Calcifications of the thoracic aorta. No evidence of aneurysm or periaortic fluid. Maximum diameter of the AA ascending aorta measures 3.9 cm. Calcifications of the branch vessels and descending thoracic aorta.  Moderate right-sided pleural  effusion. Small left-sided pleural effusion. There is associated atelectatic changes of the bilateral lungs.  No significant interlobular septal thickening. Trace fluid within the right minor fissure an within the right major fissure.  Linear and ground-glass opacities at the bilateral lung bases.  Airways are patent without evidence of debris within the airways.  Unremarkable appearance of the upper abdomen.  Partial visualization of left abdominal hernia, present on the comparison CT dated 09/04/2008.  Musculoskeletal:  No displaced fracture.  Sclerotic appearance of the vertebral bodies, may be related to renal dysfunction. Multilevel degenerative changes of the thoracic spine.  IMPRESSION: Moderate right-sided pleural effusion with small left-sided pleural effusion. Associated compressive atelectasis. Etiology is uncertain, as there is no evidence of overt pulmonary edema, and there is no evidence of lobar pneumonia. Mixed linear and ground-glass opacities of the bilateral lungs favored to represent atelectasis/ scarring.  Atherosclerosis with left main and 3 vessel coronary artery disease.  Additional findings as above.  Signed,  Dulcy Fanny. Earleen Newport, DO  [facility]  [facility]   Electronically Signed   By: Corrie Mckusick D.O.   On: 07/14/2014 16:43    Scheduled Meds: . aspirin EC  81 mg Oral Daily  . atorvastatin  40 mg Oral q1800  . calcitRIOL  0.25 mcg Oral Daily  . carvedilol  3.125 mg Oral BID WC  . docusate sodium  100 mg Oral BID  . furosemide  80 mg Oral q morning - 10a  . heparin  5,000 Units Subcutaneous 3 times per day  . insulin aspart  0-9 Units Subcutaneous TID WC  . LORazepam  0.5 mg Oral BID  . mirtazapine  7.5 mg Oral QHS  . polyethylene glycol  17 g Oral Daily  . sodium chloride  3 mL Intravenous Q12H  . sodium chloride  3 mL Intravenous Q12H  . vitamin B-12  1,000 mcg Oral Daily   Continuous Infusions:   Principal Problem:    Acute respiratory failure with hypoxia Active Problems:   Anemia   Diabetes mellitus, type II   Hypertension   Chronic atrial fibrillation   Chronic diastolic CHF (congestive heart failure)   CKD (chronic kidney disease), stage IV   Moderate aortic stenosis   Type 2 diabetes mellitus with diabetic chronic kidney disease   Acute on chronic diastolic CHF (congestive heart failure)   Choledocholithiasis with acute cholecystitis   Pleural effusion, right   Pleural effusion   Lung mass   DNR (do not resuscitate)    Time spent: 30 min    Oluwatomisin Deman, [facility] Pager [phone]. If 7PM-7AM, please contact night-coverage at [email or web address], password [facility] 07/16/2014, 4:00 PM  LOS: 2 days

## 2014-07-17 ENCOUNTER — Inpatient Hospital Stay (HOSPITAL_COMMUNITY): Payer: Medicare Other

## 2014-07-17 DIAGNOSIS — J948 Other specified pleural conditions: Secondary | ICD-10-CM

## 2014-07-17 LAB — GLUCOSE, CAPILLARY
GLUCOSE-CAPILLARY: 176 mg/dL — AB (ref 70–99)
Glucose-Capillary: 137 mg/dL — ABNORMAL HIGH (ref 70–99)
Glucose-Capillary: 152 mg/dL — ABNORMAL HIGH (ref 70–99)
Glucose-Capillary: 110 mg/dL — ABNORMAL HIGH (ref 70–99)

## 2014-07-17 LAB — IRON AND TIBC
Iron: 19 ug/dL — ABNORMAL LOW (ref 42–145)
Saturation Ratios: 10 % — ABNORMAL LOW (ref 20–55)
TIBC: 183 ug/dL — ABNORMAL LOW (ref 250–470)
UIBC: 164 ug/dL (ref 125–400)

## 2014-07-17 LAB — TSH: TSH: 0.935 u[IU]/mL (ref 0.350–4.500)

## 2014-07-17 LAB — COMPREHENSIVE METABOLIC PANEL
ALT: 6 U/L (ref 0–35)
ANION GAP: 10 (ref 5–15)
AST: 13 U/L (ref 0–37)
Albumin: 2.2 g/dL — ABNORMAL LOW (ref 3.5–5.2)
Alkaline Phosphatase: 112 U/L (ref 39–117)
BUN: 54 mg/dL — AB (ref 6–23)
CALCIUM: 8.4 mg/dL (ref 8.4–10.5)
CO2: 24 mmol/L (ref 19–32)
Chloride: 106 mmol/L (ref 96–112)
Creatinine, Ser: 2.85 mg/dL — ABNORMAL HIGH (ref 0.50–1.10)
GFR calc Af Amer: 16 mL/min — ABNORMAL LOW (ref >90)
GFR calc non Af Amer: 14 mL/min — ABNORMAL LOW (ref >90)
Glucose, Bld: 111 mg/dL — ABNORMAL HIGH (ref 70–99)
Potassium: 3.8 mmol/L (ref 3.5–5.1)
SODIUM: 140 mmol/L (ref 135–145)
Total Bilirubin: 0.6 mg/dL (ref 0.3–1.2)
Total Protein: 5.4 g/dL — ABNORMAL LOW (ref 6.0–8.3)

## 2014-07-17 LAB — CBC
HCT: 26.4 % — ABNORMAL LOW (ref 36.0–46.0)
HEMOGLOBIN: 8.3 g/dL — AB (ref 12.0–15.0)
MCH: 28.7 pg (ref 26.0–34.0)
MCHC: 31.4 g/dL (ref 30.0–36.0)
MCV: 91.3 fL (ref 78.0–100.0)
Platelets: 185 10*3/uL (ref 150–400)
RBC: 2.89 MIL/uL — ABNORMAL LOW (ref 3.87–5.11)
RDW: 14.9 % (ref 11.5–15.5)
WBC: 8.6 10*3/uL (ref 4.0–10.5)

## 2014-07-17 LAB — VITAMIN B12: Vitamin B-12: 1627 pg/mL — ABNORMAL HIGH (ref 211–911)

## 2014-07-17 LAB — FERRITIN: Ferritin: 618 ng/mL — ABNORMAL HIGH (ref 10–291)

## 2014-07-17 LAB — FOLATE: Folate: 8.7 ng/mL

## 2014-07-17 MED ORDER — LEVOFLOXACIN IN D5W 500 MG/100ML IV SOLN
500.0000 mg | INTRAVENOUS | Status: DC
  Administered 2014-07-19: 500 mg via INTRAVENOUS
  Filled 2014-07-17: qty 100

## 2014-07-17 MED ORDER — FUROSEMIDE 10 MG/ML IJ SOLN
80.0000 mg | Freq: Two times a day (BID) | INTRAMUSCULAR | Status: DC
  Administered 2014-07-17 – 2014-07-18 (×3): 80 mg via INTRAVENOUS
  Filled 2014-07-17 (×4): qty 8

## 2014-07-17 MED ORDER — SACCHAROMYCES BOULARDII 250 MG PO CAPS
250.0000 mg | ORAL_CAPSULE | Freq: Two times a day (BID) | ORAL | Status: DC
  Administered 2014-07-17 – 2014-07-24 (×13): 250 mg via ORAL
  Filled 2014-07-17 (×16): qty 1

## 2014-07-17 MED ORDER — LEVOFLOXACIN IN D5W 750 MG/150ML IV SOLN
750.0000 mg | Freq: Once | INTRAVENOUS | Status: AC
  Administered 2014-07-17: 750 mg via INTRAVENOUS
  Filled 2014-07-17: qty 150

## 2014-07-17 NOTE — Evaluation (Signed)
Occupational Therapy Evaluation and Discharge Patient Details Name: [name] MRN: [id number] DOB: [birth year] Today's Date: 07/17/2014    History of Present Illness Patient presents to the ER from home by EMS. Patient has been experiencing shortness of breath for the last 3 days. Patient does have a history of congestive heart failure, has increased swelling of her legs. Recently underwent ERCP On 4/19 for retained bile duct stones and has cystostomy tube.    Clinical Impression   This 81 yo female admitted with above presents to acute OT at a S level and will have this at her daughter's house. No further OT needs, we will sign off.    Follow Up Recommendations  No OT follow up    Equipment Recommendations  None recommended by OT       Precautions / Restrictions Precautions Precautions:  (Not the safeist with RW, puts if off to the side and "furniture" walks, walks further back from it but probably because she normally uses a 4RW) Precaution Comments: cystostomy drainage bag and a colostomy Restrictions Weight Bearing Restrictions: No      Mobility Bed Mobility Overal bed mobility: Modified Independent Bed Mobility: Supine to Sit     Supine to sit: Modified independent (Device/Increase time);HOB elevated        Transfers Overall transfer level: Needs assistance Equipment used: Rolling walker (2 wheeled) Transfers: Sit to/from Stand Sit to Stand: Supervision              Balance Overall balance assessment: No apparent balance deficits (not formally assessed)                                          ADL Overall ADL's : Needs assistance/impaired Eating/Feeding: Independent;Sitting   Grooming: Supervision/safety;Wash/dry hands;Standing   Upper Body Bathing: Supervision/ safety;Sitting   Lower Body Bathing: Supervison/ safety;Sit to/from stand   Upper Body Dressing : Supervision/safety;Sitting   Lower Body Dressing:  Supervision/safety;Sit to/from stand   Toilet Transfer: Supervision/safety;Ambulation;RW;Regular Toilet;Grab bars   Toileting- Clothing Manipulation and Hygiene: Supervision/safety;Sit to/from stand                         Pertinent Vitals/Pain Pain Assessment: No/denies pain     Hand Dominance Right   Extremity/Trunk Assessment Upper Extremity Assessment Upper Extremity Assessment: Overall WFL for tasks assessed           Communication Communication Communication: No difficulties   Cognition Arousal/Alertness: Awake/alert Behavior During Therapy: WFL for tasks assessed/performed Overall Cognitive Status: Within Functional Limits for tasks assessed                                Home Living Family/patient expects to be discharged to:: Private residence Living Arrangements: Children Available Help at Discharge: Family;Available 24 hours/day Type of Home: House Home Access: Stairs to enter [employer] of Steps: 5 Entrance Stairs-Rails: Left;Right;Can reach both Home Layout: One level     Bathroom Shower/Tub: Tub/shower unit;Curtain Shower/tub characteristics: Architectural technologist: Handicapped height Bathroom Accessibility: Yes   Home Equipment: Environmental consultant - 2 wheels;Walker - 4 wheels;Bedside commode;Shower seat;Cane - single point          Prior Functioning/Environment Level of Independence: Independent with assistive device(s)        Comments: Uses rollator.     OT Diagnosis:  Generalized weakness         OT Goals(Current goals can be found in the care plan section) Acute Rehab OT Goals Patient Stated Goal: to get some rest  OT Frequency:                End of Session Equipment Utilized During Treatment: Rolling walker  Activity Tolerance: Patient limited by fatigue ("not feeling good today") Patient left: in chair;with call bell/phone within reach;with family/visitor present   Time: 1225-1237 OT Time  Calculation (min): 12 min Charges:  OT General Charges $OT Visit: 1 Procedure OT Evaluation $Initial OT Evaluation Tier I: 1 Procedure  Almon Register [phone] 07/17/2014, 12:48 PM

## 2014-07-17 NOTE — Progress Notes (Signed)
[facility] PROGRESS NOTE  LAURAINE CRESPO UDJ:[id number] DOB: [birth year] DOA: 07/14/2014 PCP: Abigail Miyamoto, MD  Brief Summary  [name] is a 81 y.o. female, with a complex past medical history including chronic kidney disease (baseline creatinine 2.3-2.6), chronic diastolic heart failure with pulmonary hypertension (last EF in February 2016 was 65-70%), diabetes mellitus, moderate to severe aortic stenosis, atrial fibrillation (not on Coumadin), colon cancer 2 status post colostomy, and recent acalculous cholecystitis status post perc cholecystostomy tube placement.  She underwent ERCP with stone extraction on 4/19.  She presented to the ER on 4/22 with increasing dyspnea, lower extremity swelling, and low grade fever.  In the emergency department she was mildly hypoxic 88% on room air and CXR demonstrated an enlarged right-sided moderate pleural effusion.  PCCM was consulted and she underwent thoracentesis.  Pleural fluid was exudative by Lights criteria, however, culture was negative.  Suspect she has some transudative component also given her heart failure.  Her repeat CXR demonstrated worsening effusion and pulmonary edema and her diuretics have been escalated.  She will undergo repeat thoracentesis.  Her cholecystostomy tube was clamped in preparation for removal.    Assessment/Plan  Acute hypoxic respiratory failure secondary to RIGHT exudative pleural effusion likely caused by recent ERCP with stone extraction  -   Underwent thoracentesis on 4/22 with removal of 1.5L cloudy pleural fluid by pulmonology  -  Exudative by light's criteria by total protein and LDH -   F/u CT chest demonstrated moderate right sided pleural effusion with small left pleural effusion, no overt edema or lobar pneumonia -  CXR 4/23 with persistent effusion and atelectasis vs. consolidation -  Cytology:  No malignant cells, + acute inflammatory and reactive mesothelial cells -  Pleural fluid culture NGTD -   ANA, RF pending -  Repeat CXR 4/25 with increased pulmonary edema and persistent effusion -  Will start levofloxacin for PNA treatment (also covers the citrobacter found in her abscessed gallbladder) per PCCM recs -  IR consult for thoracentesis   Acute on chronic diastolic CHF, persistent LEE -  Last echo shows EF of 65 - 70 in 04/2014. -  BNP 526.1 (troponin 0.03) -  Increase to lasix 80mg  IV BID -  Weight down 1 kg  DM II 05/10/2014 hemoglobin A1c 6.8, CBG at goal -  Hold oral medications.  -  Continue Lantus and SSI  Chronic A Fib, CHADS-VASc= 5 -  Rate controlled with some 2.5 second pauses -  Not a candidate for anticoagulation due to falls. -  D/c telemetry  CAD based on CT scan 4/22 -   Continue aspirin, BB, change to high dose statin  CKD stage IV, baseline creatinine 2.5-3, at baseline -  Trend creatinine with diuresis -  Minimize nephrotoxins and renally dose medications  Acute Cholecystitis in 04/2014 s/p percutaneous cholecystostomy tube placement  -  Underwent outpatient ERCP on 4/19. Lipase is 38. LFTs WNL. -  Discussed case with Dr. Pyrtle/Dr. Deatra Ina -  Perc chole clamped on 4/24 -  IR to remove on 4/26 if LFTs remain stable  Chronic back pain. -  Continue Hydrocodone at home dose.  Hypertension, BP stable -  Continue carvedilol to 3.125mg  BID  Hx of colon cancer 2002 s/p right hemicolectomy and colostomy, BM firm -  Continue miralax  Normocytic anemia, previously iron deficient -  Iron studies c/w inflammation as ferritin quite elevated, b12 1627, folate 8.7, TSH 0.935 - hgb stable around 8.2-8.3  Diet:  diabetic Access:  PIV IVF:  off Proph:  heparin  Code Status: DNR Family Communication: patient and her two daughters Disposition Plan:  Pending diuresis, repeat thoracentesis  Consultants:  Pulmonology  GI  Procedures:  Thoracentesis 4/22 with removal of 1.5L cloudy pleural  fluid  Antibiotics:  none  HPI/Subjective:  Persistent pleuritic chest pains.  Feels very fatigued.  Glad she will be able to get rid of her perc cholecystostomy drain.  Stools are softer now.   Objective: Filed Vitals:   07/17/14 0618 07/17/14 1019 07/17/14 1242 07/17/14 1443  BP: 125/46 116/42  129/50  Pulse: 73 72  79  Temp: 99.3 F (37.4 C)   99.1 F (37.3 C)  TempSrc: Oral   Oral  Resp: 18   16  Height:      Weight:      SpO2: 93%  97% 91%    Intake/Output Summary (Last 24 hours) at 07/17/14 1651 Last data filed at 07/17/14 1446  Gross per 24 hour  Intake   1560 ml  Output    727 ml  Net    833 ml   Filed Weights   07/15/14 0510 07/16/14 0456 07/17/14 0329  Weight: 77.2 kg (170 lb 3.1 oz) 77.747 kg (171 lb 6.4 oz) 76.975 kg (169 lb 11.2 oz)    Exam:   General:  Average weight female, No acute distress, sitting in chair  HEENT:  NCAT, MMM  Cardiovascular:  RRR, nl S1, S2, 3/6 systolic murmur at the left lateral chest   Respiratory:  Diminished on the right to the mid-back with rales, diminished at left base, no wheezes, no increased WOB  Abdomen:   NABS, soft, ND, NT, RUQ drain clamped.  Soft stool in LUQ colostomy bag  MSK:   Normal tone and bulk, 1+ bilateral pitting LEE  Neuro:  Grossly intact  Data Reviewed: Basic Metabolic Panel:  Recent Labs Lab 07/11/14 1102 07/14/14 1125 07/15/14 0505 07/16/14 0702 07/17/14 0518  NA 137 142 143 140 140  K 3.7 3.8 3.7 3.6 3.8  CL  --  105 107 106 106  CO2  --  24 26 23 24   GLUCOSE 129* 121* 99 119* 111*  BUN  --  47* 51* 56* 54*  CREATININE  --  2.69* 2.90* 2.93* 2.85*  CALCIUM  --  8.7 8.4 8.3* 8.4   Liver Function Tests:  Recent Labs Lab 07/14/14 1125 07/15/14 0505 07/17/14 0518  AST 15 10 13   ALT 8 6 6   ALKPHOS 117 97 112  BILITOT 0.6 0.7 0.6  PROT 6.4 5.4* 5.4*  ALBUMIN 2.7* 2.1* 2.2*    Recent Labs Lab 07/14/14 1125  LIPASE 38   No results for input(s): AMMONIA in the last  168 hours. CBC:  Recent Labs Lab 07/11/14 1102 07/14/14 1125 07/15/14 0505 07/16/14 0702 07/17/14 0518  WBC  --  9.9 7.7 9.4 8.6  NEUTROABS  --  7.6  --   --   --   HGB 9.2* 9.6* 8.2* 8.2* 8.3*  HCT 27.0* 30.8* 26.8* 26.2* 26.4*  MCV  --  91.4 92.4 92.3 91.3  PLT  --  165 184 174 185   Cardiac Enzymes:  Recent Labs Lab 07/14/14 1125  TROPONINI 0.03   BNP (last 3 results)  Recent Labs  03/15/14 0914 07/14/14 1125  BNP 941.4* 526.1*    ProBNP (last 3 results) No results for input(s): PROBNP in the last 8760 hours.  CBG:  Recent Labs Lab 07/16/14 1629 07/16/14 2228 07/17/14  3419 07/17/14 1113 07/17/14 1623  GLUCAP 138* 124* 137* 152* 176*    Recent Results (from the past 240 hour(s))  Urine culture     Status: None   Collection Time: 07/14/14  1:14 PM  Result Value Ref Range Status   Specimen Description URINE, RANDOM  Final   Special Requests NONE  Final   Colony Count NO GROWTH Performed at [facility]   Final   Culture NO GROWTH Performed at [facility]   Final   Report Status 07/16/2014 FINAL  Final  Body fluid culture     Status: None (Preliminary result)   Collection Time: 07/14/14  4:04 PM  Result Value Ref Range Status   Specimen Description FLUID RIGHT PLEURAL  Final   Special Requests Normal  Final   Gram Stain   Final    RARE WBC PRESENT, PREDOMINANTLY PMN NO ORGANISMS SEEN Performed at [facility]    Culture   Final    NO GROWTH 2 DAYS Performed at [facility]    Report Status PENDING  Incomplete     Studies: Dg Chest Port 1 View  07/17/2014   CLINICAL DATA:  Cough and pleural effusion  EXAM: PORTABLE CHEST - 1 VIEW  COMPARISON:  PA and lateral chest of July 15, 2014  FINDINGS: There is persistent increased density in the mid and lower right hemi thorax. There is obscuration of the hemidiaphragm. The pulmonary interstitial markings on the right are increased as compared to the earlier study.  The left lung is well-expanded. There is minimal density at the left lung base consistent with subsegmental atelectasis. The cardiac silhouette is enlarged. There is mild cephalization of the vascular pattern. The bony structures exhibit no acute abnormalities.  IMPRESSION: 1. Worsening appearance of airspace opacities in the right mid and lower lung consistent with atelectasis or pneumonia. There is associated pleural fluid on the right. There is subsegmental atelectasis at the left lung base. 2. Low-grade CHF with mild pulmonary interstitial edema.   Electronically Signed   By: David  Martinique M.D.   On: 07/17/2014 07:44    Scheduled Meds: . aspirin EC  81 mg Oral Daily  . atorvastatin  40 mg Oral q1800  . calcitRIOL  0.25 mcg Oral Daily  . carvedilol  3.125 mg Oral BID WC  . docusate sodium  100 mg Oral BID  . furosemide  80 mg Intravenous BID  . heparin  5,000 Units Subcutaneous 3 times per day  . insulin aspart  0-9 Units Subcutaneous TID WC  . LORazepam  0.5 mg Oral BID  . mirtazapine  7.5 mg Oral QHS  . polyethylene glycol  17 g Oral Daily  . sodium chloride  3 mL Intravenous Q12H  . sodium chloride  3 mL Intravenous Q12H  . vitamin B-12  1,000 mcg Oral Daily   Continuous Infusions:   Principal Problem:   Acute respiratory failure with hypoxia Active Problems:   Anemia   Diabetes mellitus, type II   Hypertension   Chronic atrial fibrillation   Chronic diastolic CHF (congestive heart failure)   CKD (chronic kidney disease), stage IV   Moderate aortic stenosis   Type 2 diabetes mellitus with diabetic chronic kidney disease   Acute on chronic diastolic CHF (congestive heart failure)   Choledocholithiasis with acute cholecystitis   Pleural effusion, right   Pleural effusion   Lung mass   DNR (do not resuscitate)    Time spent: 30 min  Janece Canterbury  [facility] Pager [phone]. If 7PM-7AM, please contact night-coverage at [email or web address], password  [facility] 07/17/2014, 4:51 PM  LOS: 3 days

## 2014-07-17 NOTE — Progress Notes (Signed)
Pharmacy Consult --> Levaquin  Pharmacy asked to dose Levaquin for CAP CrCl = 14 ml/min  Plan: Levaquin 750 mg IV x 1 then 500 mg iv Q 48 hours Pharmacy will sign off - re-consult if needed  Thank you. Anette Guarneri, PharmD

## 2014-07-17 NOTE — Progress Notes (Signed)
PT Cancellation Note  Patient Details Name: [name] MRN: [id number] DOB: [birth year]   Cancelled Treatment:    Reason Eval/Treat Not Completed: Other (comment) (pt currently working with OT and unavailable)   Melford Aase 07/17/2014, 12:32 PM Elwyn Reach, [place]

## 2014-07-17 NOTE — Progress Notes (Signed)
Name: [name] MRN: [id number] DOB: [birth year]    ADMISSION DATE:  07/14/2014 CONSULTATION DATE:  4/22  REFERRING MD :  Betsey Holiday   CHIEF COMPLAINT:  Right pleural effusion   BRIEF PATIENT DESCRIPTION:  81 year old female w/ sig PMH chronic diastolic HF, AS, CAF (not on anticoagulation). Recently underwent ERCP On 4/19 for retained bile duct stones. Discharged to home w/ cystostomy tube. Since d/c has had 3 d h/o progressive LE edema increased SOB and right sided pleuritic type CP for which she presented to the ER. On CXR was found to have large right pleural effusion for which we have been consulted.    SIGNIFICANT EVENTS  ERCP 4/19 R thoracentesis 4/22>>> 1.5L off - minimal exudate by total protein, 4500 WBC's with 72% neutrophils  CT chest 4/22>>> (POST thora) Moderate right-sided pleural effusion with small left-sided pleural effusion. Associated compressive atelectasis. Etiology is uncertain, as there is no evidence of overt pulmonary edema, and there is no evidence of lobar pneumonia. Mixed linear and ground-glass opacities of the bilateral lungs favored to represent atelectasis/ scarring.  STUDIES:   SUBJECTIVE:  Denies SOB.  Feeling better.  Still some DOE.   VITAL SIGNS: Temp:  [98 F (36.7 C)-99.3 F (37.4 C)] 99.3 F (37.4 C) (04/25 0618) Pulse Rate:  [68-81] 73 (04/25 0618) Resp:  [17-18] 18 (04/25 0618) BP: (120-133)/(46-67) 125/46 mmHg (04/25 0618) SpO2:  [93 %-97 %] 93 % (04/25 0618) Weight:  [169 lb 11.2 oz (76.975 kg)] 169 lb 11.2 oz (76.975 kg) (04/25 0329)  PHYSICAL EXAMINATION: General:  81 year old female, NAD on RA lying in bed  Neuro:  Awake, alert, oriented. No focal def  HEENT:  [place], No JVD  Cardiovascular:  Reg irreg + IV/VI murmur c/w AS Lungs:  Diminished R, few scattered crackles L, resps even non labored on RA   Abdomen:  Soft, non-tender + bowel sounds  Musculoskeletal:  Intact  Skin:  3+ LE edema. Chronic venous changes    Recent  Labs Lab 07/15/14 0505 07/16/14 0702 07/17/14 0518  NA 143 140 140  K 3.7 3.6 3.8  CL 107 106 106  CO2 26 23 24   BUN 51* 56* 54*  CREATININE 2.90* 2.93* 2.85*  GLUCOSE 99 119* 111*    Recent Labs Lab 07/15/14 0505 07/16/14 0702 07/17/14 0518  HGB 8.2* 8.2* 8.3*  HCT 26.8* 26.2* 26.4*  WBC 7.7 9.4 8.6  PLT 184 174 185   Dg Chest Port 1 View  07/17/2014   CLINICAL DATA:  Cough and pleural effusion  EXAM: PORTABLE CHEST - 1 VIEW  COMPARISON:  PA and lateral chest of July 15, 2014  FINDINGS: There is persistent increased density in the mid and lower right hemi thorax. There is obscuration of the hemidiaphragm. The pulmonary interstitial markings on the right are increased as compared to the earlier study. The left lung is well-expanded. There is minimal density at the left lung base consistent with subsegmental atelectasis. The cardiac silhouette is enlarged. There is mild cephalization of the vascular pattern. The bony structures exhibit no acute abnormalities.  IMPRESSION: 1. Worsening appearance of airspace opacities in the right mid and lower lung consistent with atelectasis or pneumonia. There is associated pleural fluid on the right. There is subsegmental atelectasis at the left lung base. 2. Low-grade CHF with mild pulmonary interstitial edema.   Electronically Signed   By: David  Martinique M.D.   On: 07/17/2014 07:44    ASSESSMENT / PLAN:  Acute hypoxemic respiratory failure -- in setting of Large right Pleural effusion, HCAP, dCHF and severe AS Large R pleural effusion - s/p thoracentesis 4/22.  ? R/t heart failure v parapneumonic (doubt) v malignant in setting colon cancer.  Diastolic HF Severe AS AFib  HTN  CKD stage IV Biliary obstruction s/p recent ERCP 4/19  Plan F/u pleural fluid cytology and cultures  Cont diuresis as tolerated  Cont abx for ?HCAP and abd process  Supplemental O2 as needed  Pulmonary hygiene  Consider repeat CT chest with worsening CXR v repeat  thoracentesis  2D echo pending    EOL: patient wishes to be DNR.   Nickolas Madrid, NP 07/17/2014  9:19 AM Pager: [phone] or [phone]

## 2014-07-17 NOTE — Consult Note (Addendum)
WOC wound consult note Reason for Consult: Consult requested for ostomy and right drainage tube. Pt is independent with ostomy pouch application and emptying, ostomy has been in place for 30 years.  She denies questions or problems with pouching routines, and brought her supplies from home to use.  Current pouch intact with good seal to LLQ, small amt brown unformed stool and stoma appears to be red and viable when viewed through pouch.   Right abd with drainage tube in place.  No breakdown or leakage at insertion site. Dry gauze dressing in place.    No role for wound or ostomy services at this time. Please re-consult if further assistance is needed.  Thank-you,  Julien Girt MSN, [place], [place], [place], [place]

## 2014-07-18 ENCOUNTER — Encounter: Payer: Self-pay | Admitting: Cardiology

## 2014-07-18 ENCOUNTER — Encounter (HOSPITAL_COMMUNITY): Payer: Self-pay | Admitting: General Practice

## 2014-07-18 ENCOUNTER — Inpatient Hospital Stay (HOSPITAL_COMMUNITY): Payer: Medicare Other

## 2014-07-18 LAB — COMPREHENSIVE METABOLIC PANEL
ALT: 8 U/L (ref 0–35)
ANION GAP: 14 (ref 5–15)
AST: 14 U/L (ref 0–37)
Albumin: 2.2 g/dL — ABNORMAL LOW (ref 3.5–5.2)
Alkaline Phosphatase: 110 U/L (ref 39–117)
BUN: 52 mg/dL — AB (ref 6–23)
CO2: 22 mmol/L (ref 19–32)
CREATININE: 2.63 mg/dL — AB (ref 0.50–1.10)
Calcium: 8.4 mg/dL (ref 8.4–10.5)
Chloride: 105 mmol/L (ref 96–112)
GFR calc Af Amer: 18 mL/min — ABNORMAL LOW (ref >90)
GFR, EST NON AFRICAN AMERICAN: 15 mL/min — AB (ref >90)
GLUCOSE: 95 mg/dL (ref 70–99)
Potassium: 4.1 mmol/L (ref 3.5–5.1)
SODIUM: 141 mmol/L (ref 135–145)
Total Bilirubin: 0.7 mg/dL (ref 0.3–1.2)
Total Protein: 5.2 g/dL — ABNORMAL LOW (ref 6.0–8.3)

## 2014-07-18 LAB — CBC
HCT: 26.4 % — ABNORMAL LOW (ref 36.0–46.0)
Hemoglobin: 8.3 g/dL — ABNORMAL LOW (ref 12.0–15.0)
MCH: 28.9 pg (ref 26.0–34.0)
MCHC: 31.4 g/dL (ref 30.0–36.0)
MCV: 92 fL (ref 78.0–100.0)
Platelets: 184 10*3/uL (ref 150–400)
RBC: 2.87 MIL/uL — ABNORMAL LOW (ref 3.87–5.11)
RDW: 15.1 % (ref 11.5–15.5)
WBC: 8.8 10*3/uL (ref 4.0–10.5)

## 2014-07-18 LAB — BODY FLUID CULTURE
Culture: NO GROWTH
SPECIAL REQUESTS: NORMAL

## 2014-07-18 LAB — GLUCOSE, CAPILLARY
GLUCOSE-CAPILLARY: 102 mg/dL — AB (ref 70–99)
GLUCOSE-CAPILLARY: 171 mg/dL — AB (ref 70–99)
GLUCOSE-CAPILLARY: 90 mg/dL (ref 70–99)
GLUCOSE-CAPILLARY: 217 mg/dL — AB (ref 70–99)

## 2014-07-18 LAB — TRANSFERRIN: Transferrin: 144 mg/dL — ABNORMAL LOW (ref 200–370)

## 2014-07-18 LAB — RHEUMATOID FACTORS, FLUID: Rheumatoid Arthritis, Qn/Fluid: POSITIVE — AB

## 2014-07-18 MED ORDER — FUROSEMIDE 10 MG/ML IJ SOLN
120.0000 mg | Freq: Two times a day (BID) | INTRAVENOUS | Status: DC
  Administered 2014-07-18 – 2014-07-20 (×4): 120 mg via INTRAVENOUS
  Filled 2014-07-18 (×7): qty 12

## 2014-07-18 NOTE — Progress Notes (Signed)
Physical Therapy Treatment Patient Details Name: [name] MRN: [id number] DOB: Mar 01, 1926 Today's Date: 07/18/2014    History of Present Illness Patient presents to the ER from home by EMS. Patient has been experiencing shortness of breath for the last 3 days. Patient does have a history of congestive heart failure, has increased swelling of her legs. Recently underwent ERCP On 4/19 for retained bile duct stones and has cystostomy tube.     PT Comments    Required much encouragement to participate due to general malaise, but she was ultimately agreeable to progressive amb after a discussion re: benefits of daily walking in the hallways  Follow Up Recommendations  No PT follow up     Equipment Recommendations  None recommended by PT    Recommendations for Other Services       Precautions / Restrictions Precautions Precautions:  (Not the safeist with RW, puts if off to the side and "furniture" walks, walks further back from it but probably because she normally uses a 4RW) Precaution Comments: cystostomy drainage bag and a colostomy    Mobility  Bed Mobility                  Transfers Overall transfer level: Needs assistance Equipment used: Rolling walker (2 wheeled) Transfers: Sit to/from Stand Sit to Stand: Supervision         General transfer comment: Cues to initiate; definite reliance on UE support  Ambulation/Gait Ambulation/Gait assistance: Min guard (without physical contact) Ambulation Distance (Feet): 80 Feet Assistive device: Rolling walker (2 wheeled) Gait Pattern/deviations: Trunk flexed     General Gait Details: walks with her RW a bit too far in front, likely a function of her regularly using rollator RW   Stairs            Wheelchair Mobility    Modified Rankin (Stroke Patients Only)       Balance                                    Cognition Arousal/Alertness: Awake/alert Behavior During Therapy: WFL for  tasks assessed/performed Overall Cognitive Status: Within Functional Limits for tasks assessed                      Exercises      General Comments        Pertinent Vitals/Pain Pain Assessment:  (but reporting general malaise)    Home Living Family/patient expects to be discharged to:: Private residence Living Arrangements: Children                  Prior Function            PT Goals (current goals can now be found in the care plan section) Acute Rehab PT Goals Patient Stated Goal: to get some rest PT Goal Formulation: With patient/family Time For Goal Achievement: 07/29/14 Potential to Achieve Goals: Good Progress towards PT goals: Progressing toward goals    Frequency  Min 3X/week    PT Plan Current plan remains appropriate    Co-evaluation             End of Session   Activity Tolerance: Patient tolerated treatment well Patient left: with family/visitor present (finishing up in bathroom)     Time: 7948-0165 PT Time Calculation (min) (ACUTE ONLY): 11 min  Charges:  $Gait Training: 8-22 mins  G Codes:      Roney Marion [facility] 07/18/2014, 12:19 PM  Roney Marion, [place] Pager [phone] Office [phone]

## 2014-07-18 NOTE — Progress Notes (Signed)
IR called [place] about removal of this patient's perc chole drain.  The patient has a patent cystic duct on ERCP just one week ago.  I have spoken with Dr. Grandville Silos who is ok with this being removed.  We will then have then patient return to see Dr. Grandville Silos in the office to discuss further consideration of surgery vs no further treatment.  I have let IR know they can remove this.  Clemma Johnsen E 10:45 AM 07/18/2014

## 2014-07-18 NOTE — Care Management Note (Unsigned)
    Page 1 of 1   07/21/2014     5:11:50 PM CARE MANAGEMENT NOTE 07/21/2014  Patient:  [name], [name]   Account Number:  [id number]  Date Initiated:  07/18/2014  Documentation initiated by:  Chayim Bialas  Subjective/Objective Assessment:   Pt adm on 07/14/14 with CHF/respiratory failure.  PTA, pt resides at home with family.     Action/Plan:   Will follow for dc needs as pt progresses.  PT/OT recommending no OP follow up.   Anticipated DC Date:  07/19/2014   Anticipated DC Plan:  [place]  CM consult      Choice offered to / List presented to:             Status of service:  In process, will continue to follow Medicare Important Message given?  YES (If response is "NO", the following Medicare IM given date fields will be blank) Date Medicare IM given:  07/18/2014 Medicare IM given by:  [name] Date Additional Medicare IM given:  07/21/2014 Additional Medicare IM given by:  [name]  Discharge Disposition:    Per UR Regulation:  Reviewed for med. necessity/level of care/duration of stay  If discussed at [place] of Stay Meetings, dates discussed:   07/20/2014    Comments:  07/20/14 Natasha Lambert, [name], [name] [phone] Pt active with [facility] prior to admission for [facility].  Will need resumption orders for [facility] care at dc with face to face documentation.  Thanks.

## 2014-07-18 NOTE — Progress Notes (Signed)
Name: [name] MRN: [id number] DOB: Jun 12, 1925    ADMISSION DATE:  07/14/2014 CONSULTATION DATE:  4/22  REFERRING MD :  Betsey Holiday   CHIEF COMPLAINT:  Right pleural effusion   BRIEF PATIENT DESCRIPTION:  81 year old female w/ sig PMH chronic diastolic HF, AS, CAF (not on anticoagulation). Recently underwent ERCP On 4/19 for retained bile duct stones. Discharged to home w/ cystostomy tube. Since d/c has had 3 d h/o progressive LE edema increased SOB and right sided pleuritic type CP for which she presented to the ER. On CXR was found to have large right pleural effusion for which we have been consulted.    SIGNIFICANT EVENTS  ERCP 4/19 R thoracentesis 4/22>>> 1.5L off - minimal exudate by total protein, 4500 WBC's with 72% neutrophils  CT chest 4/22>>> (POST thora) Moderate right-sided pleural effusion with small left-sided pleural effusion. Associated compressive atelectasis. Etiology is uncertain, as there is no evidence of overt pulmonary edema, and there is no evidence of lobar pneumonia. Mixed linear and ground-glass opacities of the bilateral lungs favored to represent atelectasis/ scarring.  07/17/14  Denies SOB.  Feeling better.  Still some DOE.    SUBJECTIVE/OVERNIGHT/INTERVAL HX 07/18/14 : Feels fatigued. Wants biliary drain out. CXR with fluid recurrence. C/o dyspne on exertion  VITAL SIGNS: Temp:  [98.7 F (37.1 C)-99.8 F (37.7 C)] 98.7 F (37.1 C) (04/26 0648) Pulse Rate:  [72-82] 75 (04/26 0820) Resp:  [16-18] 18 (04/26 0648) BP: (116-138)/(42-57) 124/54 mmHg (04/26 0820) SpO2:  [91 %-97 %] 91 % (04/26 0648) Weight:  [167 lb 4.8 oz (75.887 kg)] 167 lb 4.8 oz (75.887 kg) (04/26 2683)  PHYSICAL EXAMINATION: General:  81 year old female, dcnditiond looking, sitting in chair Neuro:  Awake, alert, oriented. No focal def  HEENT:  [place], No JVD  Cardiovascular:  Reg irreg + IV/VI murmur c/w AS Lungs:  Diminished R, few scattered crackles L, resps even non labored on RA    Abdomen:  Soft, non-tender + bowel sounds  Musculoskeletal:  Intact  Skin:  3+ LE edema. Chronic venous changes   PULMONARY No results for input(s): PHART, PCO2ART, PO2ART, HCO3, TCO2, O2SAT in the last 168 hours.  Invalid input(s): PCO2, PO2  CBC  Recent Labs Lab 07/16/14 0702 07/17/14 0518 07/18/14 0538  HGB 8.2* 8.3* 8.3*  HCT 26.2* 26.4* 26.4*  WBC 9.4 8.6 8.8  PLT 174 185 184    COAGULATION No results for input(s): INR in the last 168 hours.  CARDIAC   Recent Labs Lab 07/14/14 1125  TROPONINI 0.03   No results for input(s): PROBNP in the last 168 hours.   CHEMISTRY  Recent Labs Lab 07/14/14 1125 07/15/14 0505 07/16/14 0702 07/17/14 0518 07/18/14 0538  NA 142 143 140 140 141  K 3.8 3.7 3.6 3.8 4.1  CL 105 107 106 106 105  CO2 24 26 23 24 22   GLUCOSE 121* 99 119* 111* 95  BUN 47* 51* 56* 54* 52*  CREATININE 2.69* 2.90* 2.93* 2.85* 2.63*  CALCIUM 8.7 8.4 8.3* 8.4 8.4   Estimated Creatinine Clearance: 15.3 mL/min (by C-G formula based on Cr of 2.63).   LIVER  Recent Labs Lab 07/14/14 1125 07/15/14 0505 07/17/14 0518 07/18/14 0538  AST 15 10 13 14   ALT 8 6 6 8   ALKPHOS 117 97 112 110  BILITOT 0.6 0.7 0.6 0.7  PROT 6.4 5.4* 5.4* 5.2*  ALBUMIN 2.7* 2.1* 2.2* 2.2*     INFECTIOUS  Recent Labs Lab 07/14/14  1135  LATICACIDVEN 1.24     ENDOCRINE CBG (last 3)   Recent Labs  07/17/14 1623 07/17/14 2059 07/18/14 0646  GLUCAP 176* 110* 102*         IMAGING x48h Dg Chest Portable 1 View  07/18/2014   CLINICAL DATA:  81 year old female with pleural effusion. Subsequent encounter.  EXAM: PORTABLE CHEST - 1 VIEW  COMPARISON:  Several prior exams most recent 07/17/2014 chest x-ray.  FINDINGS: Persistent consolidation right mid to lower lung zone may represent pleural fluid with adjacent atelectasis. Cannot exclude underlying infiltrate or mass by plain film examination. Please see recent CT report.  Asymmetric pulmonary vascular  congestion greater on the right.  No gross pneumothorax.  Cardiomegaly.  Calcified tortuous aorta.  Shoulder joint degenerative change greater on left.  IMPRESSION: Similar appearance of asymmetric airspace disease greater on the right as detailed above.   Electronically Signed   By: Genia Del M.D.   On: 07/18/2014 08:16   Dg Chest Port 1 View  07/17/2014   CLINICAL DATA:  Cough and pleural effusion  EXAM: PORTABLE CHEST - 1 VIEW  COMPARISON:  PA and lateral chest of July 15, 2014  FINDINGS: There is persistent increased density in the mid and lower right hemi thorax. There is obscuration of the hemidiaphragm. The pulmonary interstitial markings on the right are increased as compared to the earlier study. The left lung is well-expanded. There is minimal density at the left lung base consistent with subsegmental atelectasis. The cardiac silhouette is enlarged. There is mild cephalization of the vascular pattern. The bony structures exhibit no acute abnormalities.  IMPRESSION: 1. Worsening appearance of airspace opacities in the right mid and lower lung consistent with atelectasis or pneumonia. There is associated pleural fluid on the right. There is subsegmental atelectasis at the left lung base. 2. Low-grade CHF with mild pulmonary interstitial edema.   Electronically Signed   By: David  Martinique M.D.   On: 07/17/2014 07:44        ASSESSMENT / PLAN: Biliary obstruction s/p recent ERCP 4/19  - Acute hypoxemic respiratory failure -- in setting of Large right Pleural effusion, HCAP, dCHF and severe AS   Large R pleural effusion - s/p thoracentesis 4/22. - non diagnostic transudate   - has recurrred 07/18/2014   Plan Repeat thora - diagnostic and  Therapeutic - orderd US guided by radiology    EOL: patient wishes to be DNR.    Dr. Brand Males, M.D., [facility].C.P Pulmonary and Critical Care Medicine Staff Physician [facility] Pager: [phone],  If no answer or between  15:00h - 7:00h: call [phone]  07/18/2014 10:17 AM

## 2014-07-18 NOTE — Progress Notes (Addendum)
[facility] PROGRESS NOTE  [name] ZOX:[id number] DOB: Feb 22, 1926 DOA: 07/14/2014 PCP: Abigail Miyamoto, MD  Brief Summary  [name] is a 81 y.o. female, with a complex past medical history including chronic kidney disease (baseline creatinine 2.3-2.6), chronic diastolic heart failure with pulmonary hypertension (last EF in February 2016 was 65-70%), diabetes mellitus, moderate to severe aortic stenosis, atrial fibrillation (not on Coumadin), colon cancer 2 status post colostomy, and recent acalculous cholecystitis status post perc cholecystostomy tube placement.  She underwent ERCP with stone extraction on 4/19.  She presented to the ER on 4/22 with increasing dyspnea, lower extremity swelling, and low grade fever.  In the emergency department she was mildly hypoxic 88% on room air and CXR demonstrated an enlarged right-sided moderate pleural effusion.  PCCM was consulted and she underwent thoracentesis.  Pleural fluid was exudative by Lights criteria, however, culture was negative.  Suspect she has some transudative component also given her heart failure.  Her repeat CXR demonstrated worsening effusion and pulmonary edema and her diuretics were escalated.  She will undergo repeat thoracentesis today.  Increasing her lasix to 120mg  IV BID today also.  Her cholecystostomy tube was clamped in preparation for removal.    Assessment/Plan  Acute hypoxic respiratory failure secondary to RIGHT exudative pleural effusion likely caused by recent ERCP with stone extraction  -   Underwent thoracentesis on 4/22 with removal of 1.5L cloudy pleural fluid by pulmonology  -  Exudative by light's criteria by total protein and LDH -   F/u CT chest demonstrated moderate right sided pleural effusion with small left pleural effusion, no overt edema or lobar pneumonia -  CXR 4/23 with persistent effusion and atelectasis vs. consolidation -  Cytology:  No malignant cells, + acute inflammatory and reactive  mesothelial cells -  Pleural fluid culture NGTD -  ANA, RF pending -  Repeat CXR 4/25 with increased pulmonary edema and persistent effusion -  Started levofloxacin for PNA treatment (also covers the citrobacter found in her abscessed gallbladder) per PCCM recs, day 2 -  IR consult for thoracentesis -  CXR in a few days and if fluid reaccumulates, PCCM recommending pleurx  Acute on chronic diastolic CHF, persistent LEE -  Last echo shows EF of 65 - 70 in 04/2014. -  BNP 526.1 (troponin 0.03) -  Increase to lasix 120 mg IV BID -  Weight down 1 kg -  Not much urine after lasix this morning  DM II 05/10/2014 hemoglobin A1c 6.8, CBG at goal but mildly low this AM -  Hold oral medications.  -  D/c Lantus  -  Continue SSI  Chronic A Fib, CHADS-VASc= 5 -  Rate controlled with some 2.5 second pauses -  Not a candidate for anticoagulation due to falls. -  D/c'd telemetry  CAD based on CT scan 4/22 -   Continue aspirin, BB, change to high dose statin  CKD stage IV, baseline creatinine 2.5-3, at baseline -  Trend creatinine with diuresis -  Minimize nephrotoxins and renally dose medications  Acute Cholecystitis in 04/2014 s/p percutaneous cholecystostomy tube placement  -  Underwent outpatient ERCP on 4/19. Lipase is 38. LFTs WNL. -  Discussed case with Dr. Pyrtle/Dr. Deatra Ina -  Perc chole clamped on 4/24 -  IR to remove on 4/26 if LFTs remain stable  Chronic back pain. -  Continue Hydrocodone at home dose.  Hypertension, BP stable -  Continue carvedilol to 3.125mg  BID  Hx of colon cancer 2002 s/p  right hemicolectomy and colostomy, BM firm -  Continue miralax  Normocytic anemia, previously iron deficient -  Iron studies c/w inflammation as ferritin quite elevated, b12 1627, folate 8.7, TSH 0.935 - hgb stable around 8.2-8.3  Diet:  diabetic Access:  PIV IVF:  off Proph:  heparin  Code Status: DNR Family Communication: patient and her two daughters Disposition Plan:   Pending diuresis, repeat thoracentesis and cholecystostomy tube removal  Consultants:  Pulmonology  GI  Procedures:  Thoracentesis 4/22 with removal of 1.5L cloudy pleural fluid  Antibiotics:  none  HPI/Subjective:  Feels very fatigued.  Glad she will be able to get rid of her perc cholecystostomy drain.  Answered her questions about the possibility of getting a pleurx catheter in a few days if her fluid reaccumulates.  Objective: Filed Vitals:   07/17/14 1752 07/17/14 2100 07/18/14 0648 07/18/14 0820  BP: 127/44 118/56 138/57 124/54  Pulse: 78 74 82 75  Temp:  99.8 F (37.7 C) 98.7 F (37.1 C)   TempSrc:  Oral Oral   Resp:  17 18   Height:      Weight:   75.887 kg (167 lb 4.8 oz)   SpO2:  92% 91%     Intake/Output Summary (Last 24 hours) at 07/18/14 1421 Last data filed at 07/18/14 1300  Gross per 24 hour  Intake   1430 ml  Output   1526 ml  Net    -96 ml   Filed Weights   07/16/14 0456 07/17/14 0329 07/18/14 0648  Weight: 77.747 kg (171 lb 6.4 oz) 76.975 kg (169 lb 11.2 oz) 75.887 kg (167 lb 4.8 oz)    Exam:   General:  Average weight female, No acute distress, sitting in chair  HEENT:  NCAT, MMM  Cardiovascular:  RRR, nl S1, S2, 3/6 systolic murmur at the left lateral chest   Respiratory:  Diminished on the right to the mid-back with rales, diminished at left base, no wheezes, no increased WOB  Abdomen:   NABS, soft, ND, NT, RUQ drain clamped.    MSK:   Normal tone and bulk, 1+ bilateral pitting LEE  Neuro:  Grossly intact  Data Reviewed: Basic Metabolic Panel:  Recent Labs Lab 07/14/14 1125 07/15/14 0505 07/16/14 0702 07/17/14 0518 07/18/14 0538  NA 142 143 140 140 141  K 3.8 3.7 3.6 3.8 4.1  CL 105 107 106 106 105  CO2 24 26 23 24 22   GLUCOSE 121* 99 119* 111* 95  BUN 47* 51* 56* 54* 52*  CREATININE 2.69* 2.90* 2.93* 2.85* 2.63*  CALCIUM 8.7 8.4 8.3* 8.4 8.4   Liver Function Tests:  Recent Labs Lab 07/14/14 1125  07/15/14 0505 07/17/14 0518 07/18/14 0538  AST 15 10 13 14   ALT 8 6 6 8   ALKPHOS 117 97 112 110  BILITOT 0.6 0.7 0.6 0.7  PROT 6.4 5.4* 5.4* 5.2*  ALBUMIN 2.7* 2.1* 2.2* 2.2*    Recent Labs Lab 07/14/14 1125  LIPASE 38   No results for input(s): AMMONIA in the last 168 hours. CBC:  Recent Labs Lab 07/14/14 1125 07/15/14 0505 07/16/14 0702 07/17/14 0518 07/18/14 0538  WBC 9.9 7.7 9.4 8.6 8.8  NEUTROABS 7.6  --   --   --   --   HGB 9.6* 8.2* 8.2* 8.3* 8.3*  HCT 30.8* 26.8* 26.2* 26.4* 26.4*  MCV 91.4 92.4 92.3 91.3 92.0  PLT 165 184 174 185 184   Cardiac Enzymes:  Recent Labs Lab 07/14/14 1125  TROPONINI  0.03   BNP (last 3 results)  Recent Labs  03/15/14 0914 07/14/14 1125  BNP 941.4* 526.1*    ProBNP (last 3 results) No results for input(s): PROBNP in the last 8760 hours.  CBG:  Recent Labs Lab 07/17/14 1113 07/17/14 1623 07/17/14 2059 07/18/14 0646 07/18/14 1146  GLUCAP 152* 176* 110* 102* 171*    Recent Results (from the past 240 hour(s))  Urine culture     Status: None   Collection Time: 07/14/14  1:14 PM  Result Value Ref Range Status   Specimen Description URINE, RANDOM  Final   Special Requests NONE  Final   Colony Count NO GROWTH Performed at [facility]   Final   Culture NO GROWTH Performed at [facility]   Final   Report Status 07/16/2014 FINAL  Final  Body fluid culture     Status: None   Collection Time: 07/14/14  4:04 PM  Result Value Ref Range Status   Specimen Description FLUID RIGHT PLEURAL  Final   Special Requests Normal  Final   Gram Stain   Final    RARE WBC PRESENT, PREDOMINANTLY PMN NO ORGANISMS SEEN Performed at [facility]    Culture   Final    NO GROWTH 3 DAYS Performed at [facility]    Report Status 07/18/2014 FINAL  Final     Studies: Dg Chest Portable 1 View  07/18/2014   CLINICAL DATA:  81 year old female with pleural effusion. Subsequent encounter.  EXAM:  PORTABLE CHEST - 1 VIEW  COMPARISON:  Several prior exams most recent 07/17/2014 chest x-ray.  FINDINGS: Persistent consolidation right mid to lower lung zone may represent pleural fluid with adjacent atelectasis. Cannot exclude underlying infiltrate or mass by plain film examination. Please see recent CT report.  Asymmetric pulmonary vascular congestion greater on the right.  No gross pneumothorax.  Cardiomegaly.  Calcified tortuous aorta.  Shoulder joint degenerative change greater on left.  IMPRESSION: Similar appearance of asymmetric airspace disease greater on the right as detailed above.   Electronically Signed   By: Genia Del M.D.   On: 07/18/2014 08:16   Dg Chest Port 1 View  07/17/2014   CLINICAL DATA:  Cough and pleural effusion  EXAM: PORTABLE CHEST - 1 VIEW  COMPARISON:  PA and lateral chest of July 15, 2014  FINDINGS: There is persistent increased density in the mid and lower right hemi thorax. There is obscuration of the hemidiaphragm. The pulmonary interstitial markings on the right are increased as compared to the earlier study. The left lung is well-expanded. There is minimal density at the left lung base consistent with subsegmental atelectasis. The cardiac silhouette is enlarged. There is mild cephalization of the vascular pattern. The bony structures exhibit no acute abnormalities.  IMPRESSION: 1. Worsening appearance of airspace opacities in the right mid and lower lung consistent with atelectasis or pneumonia. There is associated pleural fluid on the right. There is subsegmental atelectasis at the left lung base. 2. Low-grade CHF with mild pulmonary interstitial edema.   Electronically Signed   By: David  Martinique M.D.   On: 07/17/2014 07:44    Scheduled Meds: . aspirin EC  81 mg Oral Daily  . atorvastatin  40 mg Oral q1800  . calcitRIOL  0.25 mcg Oral Daily  . carvedilol  3.125 mg Oral BID WC  . docusate sodium  100 mg Oral BID  . furosemide  80 mg Intravenous BID  . heparin   5,000 Units Subcutaneous  3 times per day  . insulin aspart  0-9 Units Subcutaneous TID WC  . [START ON 07/19/2014] levofloxacin (LEVAQUIN) IV  500 mg Intravenous Q48H  . LORazepam  0.5 mg Oral BID  . mirtazapine  7.5 mg Oral QHS  . polyethylene glycol  17 g Oral Daily  . saccharomyces boulardii  250 mg Oral BID  . sodium chloride  3 mL Intravenous Q12H  . sodium chloride  3 mL Intravenous Q12H  . vitamin B-12  1,000 mcg Oral Daily   Continuous Infusions:   Principal Problem:   Acute respiratory failure with hypoxia Active Problems:   Anemia   Diabetes mellitus, type II   Hypertension   Chronic atrial fibrillation   Chronic diastolic CHF (congestive heart failure)   CKD (chronic kidney disease), stage IV   Moderate aortic stenosis   Type 2 diabetes mellitus with diabetic chronic kidney disease   Acute on chronic diastolic CHF (congestive heart failure)   Choledocholithiasis with acute cholecystitis   Pleural effusion, right   Pleural effusion   Lung mass   DNR (do not resuscitate)    Time spent: 30 min    Leray Garverick, [facility] Pager [phone]. If 7PM-7AM, please contact night-coverage at [email or web address], password [facility] 07/18/2014, 2:21 PM  LOS: 4 days

## 2014-07-18 NOTE — Consult Note (Signed)
   [facility] Inpatient Consult   07/18/2014  [name] 04/10/1925 [id number] Patient evaluated for community based chronic disease management services with [facility] as a benefit of patient's Medicare Insurance. Spoke with patient and daughter at bedside to explain [facility] services.  Patient will receive post discharge transition of care call and will be evaluated for monthly home visits for assessments and disease process education. Consent not obtained.  Left contact information and [facility] literature at bedside. Patient and daughter requested to review information.   Made Inpatient Case Manager aware that [facility] outreach. Of note, [facility] Care Management services does not replace or interfere with any services that are arranged by inpatient case management or social work.  For additional questions or referrals please contact:   Natividad Brood, RN BSN [facility] Liaison  [phone] business mobile phone

## 2014-07-19 ENCOUNTER — Inpatient Hospital Stay (HOSPITAL_COMMUNITY): Payer: Medicare Other

## 2014-07-19 DIAGNOSIS — K8040 Calculus of bile duct with cholecystitis, unspecified, without obstruction: Secondary | ICD-10-CM

## 2014-07-19 LAB — COMPREHENSIVE METABOLIC PANEL
ALT: 9 U/L (ref 0–35)
AST: 14 U/L (ref 0–37)
Albumin: 2.3 g/dL — ABNORMAL LOW (ref 3.5–5.2)
Alkaline Phosphatase: 114 U/L (ref 39–117)
Anion gap: 13 (ref 5–15)
BILIRUBIN TOTAL: 0.5 mg/dL (ref 0.3–1.2)
BUN: 50 mg/dL — ABNORMAL HIGH (ref 6–23)
CO2: 24 mmol/L (ref 19–32)
Calcium: 8.3 mg/dL — ABNORMAL LOW (ref 8.4–10.5)
Chloride: 101 mmol/L (ref 96–112)
Creatinine, Ser: 2.75 mg/dL — ABNORMAL HIGH (ref 0.50–1.10)
GFR calc Af Amer: 17 mL/min — ABNORMAL LOW (ref >90)
GFR, EST NON AFRICAN AMERICAN: 15 mL/min — AB (ref >90)
Glucose, Bld: 101 mg/dL — ABNORMAL HIGH (ref 70–99)
Potassium: 3.9 mmol/L (ref 3.5–5.1)
SODIUM: 138 mmol/L (ref 135–145)
Total Protein: 5.5 g/dL — ABNORMAL LOW (ref 6.0–8.3)

## 2014-07-19 LAB — BODY FLUID CELL COUNT WITH DIFFERENTIAL
Eos, Fluid: 1 %
Lymphs, Fluid: 64 %
MONOCYTE-MACROPHAGE-SEROUS FLUID: 14 % — AB (ref 50–90)
Neutrophil Count, Fluid: 21 % (ref 0–25)
Total Nucleated Cell Count, Fluid: 1106 cu mm — ABNORMAL HIGH (ref 0–1000)

## 2014-07-19 LAB — ANA, BODY FLUID: ANTI-NUCLEAR AB, IGG: NOT DETECTED

## 2014-07-19 LAB — CBC
HEMATOCRIT: 26.8 % — AB (ref 36.0–46.0)
Hemoglobin: 8.4 g/dL — ABNORMAL LOW (ref 12.0–15.0)
MCH: 28.5 pg (ref 26.0–34.0)
MCHC: 31.3 g/dL (ref 30.0–36.0)
MCV: 90.8 fL (ref 78.0–100.0)
Platelets: 183 10*3/uL (ref 150–400)
RBC: 2.95 MIL/uL — ABNORMAL LOW (ref 3.87–5.11)
RDW: 15.1 % (ref 11.5–15.5)
WBC: 8.3 10*3/uL (ref 4.0–10.5)

## 2014-07-19 LAB — PH, BODY FLUID: pH, Fluid: 8

## 2014-07-19 LAB — GLUCOSE, CAPILLARY
GLUCOSE-CAPILLARY: 127 mg/dL — AB (ref 70–99)
Glucose-Capillary: 105 mg/dL — ABNORMAL HIGH (ref 70–99)
Glucose-Capillary: 189 mg/dL — ABNORMAL HIGH (ref 70–99)
Glucose-Capillary: 91 mg/dL (ref 70–99)

## 2014-07-19 LAB — GLUCOSE, SEROUS FLUID: Glucose, Fluid: 121 mg/dL

## 2014-07-19 LAB — LACTATE DEHYDROGENASE, PLEURAL OR PERITONEAL FLUID: LD, Fluid: 114 U/L — ABNORMAL HIGH (ref 3–23)

## 2014-07-19 LAB — LACTATE DEHYDROGENASE: LDH: 126 U/L (ref 94–250)

## 2014-07-19 LAB — AMYLASE, PLEURAL FLUID: Amylase, Pleural Fluid: 61 U/L

## 2014-07-19 MED ORDER — IOHEXOL 300 MG/ML  SOLN
50.0000 mL | Freq: Once | INTRAMUSCULAR | Status: AC | PRN
  Administered 2014-07-19: 10 mL

## 2014-07-19 MED ORDER — LIDOCAINE HCL (PF) 1 % IJ SOLN
INTRAMUSCULAR | Status: AC
  Filled 2014-07-19: qty 10

## 2014-07-19 NOTE — Procedures (Signed)
Distal CBD occluded by cholangiogram thru chole drain Keep to gravity bag Consider repeat injection in 7 days

## 2014-07-19 NOTE — Consult Note (Signed)
[facility] Consult: 11:50 AM 07/19/2014  LOS: 5 days    Referring Provider: Dr Erlinda Hong  Primary Care Physician:  Abigail Miyamoto, MD Primary Gastroenterologist:  Dr. Deatra Ina.      Reason for Consultation:  ? Recurrent choledocholithiasis.    HPI: [name] is a 81 y.o. female.  Hx A fib, no AC due to hx falls. Chronic diastolic HF.  Stage 4 CKD.  DM2, insulin requiring.  S/p APR for rectal cancer in 1980s, s/p 2002 right colectomy,peristomal hernia repair for recurrent colon cancer.  Admissions x 2 in 02/2014 with CAP, followed rapidly with admission for HCAP.    05/09/14 - 2/16 admission with acute (acalulous per surgery but sludge on ultrasound) cholecystitis.  Due to comorbidities and extensive surgical hx and high risk for needing open cholecystectomy, conservative management pursued.  S/p 2/18 perc cholecystostomy tube.  Grew citrobacter from drainage. Inpt  Zosyn, home on 7 days Cipro.  06/13/14 cholangiogram showed distal CBD obstruction, no passage of contrast into duodenum.  07/11/2014 ERCP with stones removed and sphincterotomy. No retained stones on post sweep cholangiogram.  07/14/2014 admission with acute hypoxic resp failure, right pleural effusion.  07/14/2014 right thoracentesis totaling 1.5 liters; fluid exudative by Lights criteria but suspicion is for some transudative element due to CHF. 07/19/2014 680 ml thoracentesis.  Biliary tube was clamped the morning of 4/25. Last recorded biliary drain output was 75 ml on 4/23 and 4/24, no output recorded since then since tube clamped.  At home family reports 150 to 200 ml output daily. Output is brown, bilious. Today's cholangiogram via perc drain: residual small filling defect in GB c/w cholelithiasis.  Patent cystic and common hepatic ducts.  Proximal CBD  fills but contrast does not enter duodenum, c/w persistent CBD obstruction.  Tube was left unclamped, there is ~ 50 cc of bile in drain bag now  Pt relays pain during cholangiogram and now having same RUQ pain on deep inspiration, not present prior to cholangiogram.  LFTs, with exception of alk phos of 130s and 120s in 04/2014 and normal since,  have not been elevated during this or other admissions.  No nausea but chronically poor appetite and po intake.     Past Medical History  Diagnosis Date  . CKD (chronic kidney disease), stage IV     a. Baseline Cr reported 2.5-2.6.  Marland Kitchen Anxiety   . Diabetes mellitus     diabetic retinopathy  . Anemia   . Moderate aortic stenosis     a. By echo 10/2013.  Marland Kitchen Urinary tract infection   . Fall at home   . Colostomy care     colon ca - h/o bloody output from bag 12/2012 (ED visit)  . Colon cancer 30 year ago  . Chronic atrial fibrillation     a. not on anticoagulation due to increased risk of falls, advanced age.  . Chronic diastolic CHF (congestive heart failure)     a. Echo 10/2013: mild LVH, EF 60-65%, mod AS, mildly dilated LA, mod dilated RA, PASP 33.  Marland Kitchen  Hypertension   . Carotid disease, bilateral     a. Carotid duplex 08/2012: 20-30% RICA/LICA.  Marland Kitchen H/O cardiovascular stress test     a. 2002: nuc negative for ischemia, EF 65%.  . Shortness of breath dyspnea     Past Surgical History  Procedure Laterality Date  . Abdominal surgery    . Abdominal hysterectomy    . Colon surgery    . Appendectomy    . Colostomy    . Cardioversion  09/30/2011    Procedure: CARDIOVERSION;  Surgeon: Quintella Reichert, MD;  Location: [facility] OR;  Service: Cardiovascular;  Laterality: N/A;  . I&d extremity  11/20/2011    Procedure: IRRIGATION AND DEBRIDEMENT EXTREMITY;  Surgeon: Harvie Junior, MD;  Location: [facility] OR;  Service: Orthopedics;  Laterality: Left;  . Av fistula placement Left 06/02/2012    Procedure: ARTERIOVENOUS (AV) FISTULA CREATION;  Surgeon: Fransisco Hertz, MD;   Location: [facility] OR;  Service: Vascular;  Laterality: Left;  Bascilic Fistula  . Bascilic vein transposition Left 08/18/2012    Procedure: BASCILIC VEIN TRANSPOSITION;  Surgeon: Fransisco Hertz, MD;  Location: [facility] OR;  Service: Vascular;  Laterality: Left;  Left 2nd stage Basilic Vein Transposition   . Ercp N/A 07/11/2014    Procedure: ENDOSCOPIC RETROGRADE CHOLANGIOPANCREATOGRAPHY (ERCP);  Surgeon: Louis Meckel, MD;  Location: [facility];  Service: Endoscopy;  Laterality: N/A;  . Biliary stent placement N/A 07/11/2014    Procedure: BILIARY STENT PLACEMENT;  Surgeon: Louis Meckel, MD;  Location: [facility] ENDOSCOPY;  Service: Endoscopy;  Laterality: N/A;    Prior to Admission medications   Medication Sig Start Date End Date Taking? Authorizing Provider  aspirin EC 81 MG tablet Take 81 mg by mouth daily.   Yes Historical Provider, MD  calcitRIOL (ROCALTROL) 0.25 MCG capsule Take 0.25 mcg by mouth daily.  04/06/12  Yes Historical Provider, MD  clotrimazole-betamethasone (LOTRISONE) lotion Apply 1 application topically daily as needed (apply to rash on stomach as needed).  05/25/14  Yes Historical Provider, MD  docusate sodium (COLACE) 100 MG capsule Take 100 mg by mouth daily as needed for constipation.    Yes Historical Provider, MD  furosemide (LASIX) 80 MG tablet Take 80 mg by mouth every morning.  04/10/14  Yes Historical Provider, MD  HYDROcodone-acetaminophen (NORCO/VICODIN) 5-325 MG per tablet Take 1 tablet by mouth every 4 (four) hours as needed for moderate pain. 05/16/14  Yes Catarina Hartshorn, MD  insulin glargine (LANTUS) 100 UNIT/ML injection Inject 8 Units into the skin at bedtime as needed (if cbg over 150).    Yes Historical Provider, MD  linagliptin (TRADJENTA) 5 MG TABS tablet Take 5 mg by mouth daily.    Yes Historical Provider, MD  LORazepam (ATIVAN) 0.5 MG tablet Take 0.5 mg by mouth 2 (two) times daily.   Yes Historical Provider, MD  meclizine (ANTIVERT) 12.5 MG tablet Take 12.5 mg by mouth as  needed for dizziness.  11/23/12  Yes Historical Provider, MD  mirtazapine (REMERON) 7.5 MG tablet Take 7.5 mg by mouth at bedtime.  09/28/12  Yes Historical Provider, MD  promethazine (PHENERGAN) 12.5 MG tablet Take 1 tablet (12.5 mg total) by mouth every 6 (six) hours as needed for nausea or vomiting. 03/14/14  Yes Ripudeep Jenna Luo, MD  simvastatin (ZOCOR) 20 MG tablet Take 1 tablet (20 mg total) by mouth at bedtime. 04/28/14  Yes Quintella Reichert, MD  vitamin B-12 (CYANOCOBALAMIN) 1000 MCG tablet Take 1,000 mcg by mouth daily.  Yes Historical Provider, MD  acetaminophen (TYLENOL) 500 MG tablet Take 500 mg by mouth every 6 (six) hours as needed for mild pain.     Historical Provider, MD  carvedilol (COREG) 25 MG tablet TAKE 1 TABLET (25 MG TOTAL) BY MOUTH 2 (TWO) TIMES DAILY WITH A MEAL. 07/17/14   [name], MD    Scheduled Meds: . aspirin EC  81 mg Oral Daily  . atorvastatin  40 mg Oral q1800  . calcitRIOL  0.25 mcg Oral Daily  . carvedilol  3.125 mg Oral BID WC  . docusate sodium  100 mg Oral BID  . furosemide  120 mg Intravenous BID  . heparin  5,000 Units Subcutaneous 3 times per day  . insulin aspart  0-9 Units Subcutaneous TID WC  . levofloxacin (LEVAQUIN) IV  500 mg Intravenous Q48H  . lidocaine (PF)      . LORazepam  0.5 mg Oral BID  . mirtazapine  7.5 mg Oral QHS  . polyethylene glycol  17 g Oral Daily  . saccharomyces boulardii  250 mg Oral BID  . sodium chloride  3 mL Intravenous Q12H  . sodium chloride  3 mL Intravenous Q12H  . vitamin B-12  1,000 mcg Oral Daily   Infusions:   PRN Meds: sodium chloride, acetaminophen **OR** acetaminophen, clotrimazole-betamethasone, docusate sodium, HYDROcodone-acetaminophen, meclizine, MUSCLE RUB, ondansetron **OR** ondansetron (ZOFRAN) IV, promethazine, sodium chloride   Allergies as of 07/14/2014 - Review Complete 07/14/2014  Allergen Reaction Noted  . Levaquin [levofloxacin] Nausea Only 07/11/2014  . Codeine Nausea Only 06/02/2012    . Keflet [cephalexin] Nausea Only 01/17/2012  . Morphine and related Nausea Only 06/02/2012  . Oxycodone Other (See Comments) 12/22/2011    Family History  Problem Relation Age of Onset  . Heart disease Father   . Diabetes Sister   . Diabetes Daughter   . Hyperlipidemia Daughter   . Hypertension Daughter   . Other Daughter     varicose veins  . Cancer Son   . Diabetes Son   . Heart disease Son     before age 55  . Hyperlipidemia Son   . Hypertension Son     History   Social History  . Marital Status: Widowed    Spouse Name: N/A  . Number of Children: N/A  . Years of Education: N/A   Occupational History  . Not on file.   Social History Main Topics  . Smoking status: Never Smoker   . Smokeless tobacco: Current User    Types: Snuff  . Alcohol Use: No  . Drug Use: No  . Sexual Activity: No   Other Topics Concern  . Not on file   Social History Narrative    REVIEW OF SYSTEMS: Constitutional:  Weight down about 3 # in last 2 weeks.  ENT:  No nose bleeds Pulm:  RUQ pain with deep breathing.  Dyspnea improved CV:  No palpitations, no LE edema.  GU:  No hematuria, no frequency GI:  Per HPI.  No constipation or diarrhea. Heme:  No unusual bleeding or bruising   Transfusions:  none Neuro:  No headaches, no peripheral tingling or numbness Derm:  No itching, no rash or sores.  Endocrine:  No sweats or chills.  No polyuria or dysuria Immunization:  Not investigated.  Travel:  None beyond local counties in last few months.    PHYSICAL EXAM: Vital signs in last 24 hours: Filed Vitals:   07/19/14 1003  BP: 120/58  Pulse:  Temp:   Resp:    Wt Readings from Last 3 Encounters:  07/19/14 165 lb 11.2 oz (75.161 kg)  07/11/14 170 lb (77.111 kg)  06/14/14 168 lb 6 oz (76.374 kg)    General: frail, elderly WF.  She is comfortable.  Not toxic but looks somewhat ill.  Head:  No signs of trauma, no swelling or asymmetry  Eyes:  No icterus.  Conj is pale.    Ears:  Slightly HOH  Nose:  No discharge, no congestion Mouth:  Clear, somewhat dry oral MM.  Neck:  No mass, no JVD, no mass, no TMG Lungs:  Some fine rales at right base, o/w clear without cough or dyspnea Heart: irregularly, irregular with 4/6 harsh murmer Abdomen:  Soft, ND, active BS.  Bandage over right flank perc drain site not soiled or removed.  Some tenderness RUQ but no G?R.  .   Rectal: deferred   Musc/Skeltl: no joint contracutures, erythema or swelling Extremities:  2 + LE edema  Neurologic:  Oriented to self, place, year.  No tremor, no limb weakness Skin:  No jaundice.  No rash, sores.  Tattoos:  none Nodes:  No cervical adenopathy.    Psych:  Cooperative, relaxed, pleasant.   Intake/Output from previous day: 04/26 0701 - 04/27 0700 In: 1162 [P.O.:1100; IV Piggyback:62] Out: 0865 [Urine:1550; Stool:1] Intake/Output this shift: Total I/O In: 240 [P.O.:240] Out: 300 [Urine:300]  LAB RESULTS:  Recent Labs  07/17/14 0518 07/18/14 0538 07/19/14 0442  WBC 8.6 8.8 8.3  HGB 8.3* 8.3* 8.4*  HCT 26.4* 26.4* 26.8*  PLT 185 184 183   BMET Lab Results  Component Value Date   NA 138 07/19/2014   NA 141 07/18/2014   NA 140 07/17/2014   K 3.9 07/19/2014   K 4.1 07/18/2014   K 3.8 07/17/2014   CL 101 07/19/2014   CL 105 07/18/2014   CL 106 07/17/2014   CO2 24 07/19/2014   CO2 22 07/18/2014   CO2 24 07/17/2014   GLUCOSE 101* 07/19/2014   GLUCOSE 95 07/18/2014   GLUCOSE 111* 07/17/2014   BUN 50* 07/19/2014   BUN 52* 07/18/2014   BUN 54* 07/17/2014   CREATININE 2.75* 07/19/2014   CREATININE 2.63* 07/18/2014   CREATININE 2.85* 07/17/2014   CALCIUM 8.3* 07/19/2014   CALCIUM 8.4 07/18/2014   CALCIUM 8.4 07/17/2014   LFT  Recent Labs  07/17/14 0518 07/18/14 0538 07/19/14 0442  PROT 5.4* 5.2* 5.5*  ALBUMIN 2.2* 2.2* 2.3*  AST $Re'13 14 14  'apv$ ALT $R'6 8 9  'UH$ ALKPHOS 112 110 114  BILITOT 0.6 0.7 0.5   PT/INR Lab Results  Component Value Date   INR 1.47  05/11/2014   INR 1.20 03/15/2014   INR 1.16 12/28/2012   Hepatitis Panel No results for input(s): HEPBSAG, HCVAB, HEPAIGM, HEPBIGM in the last 72 hours. C-Diff No components found for: CDIFF Lipase     Component Value Date/Time   LIPASE 38 07/14/2014 1125    Drugs of Abuse     Component Value Date/Time   LABOPIA NONE DETECTED 05/16/2012 1245   COCAINSCRNUR NONE DETECTED 05/16/2012 1245   LABBENZ POSITIVE* 05/16/2012 1245   AMPHETMU NONE DETECTED 05/16/2012 1245   THCU NONE DETECTED 05/16/2012 1245   LABBARB NONE DETECTED 05/16/2012 1245     RADIOLOGY STUDIES: Dg Chest 1 View  07/19/2014   CLINICAL DATA:  Right pleural effusion status post thoracentesis. Weakness.  EXAM: CHEST  1 VIEW  COMPARISON:  07/18/2014  FINDINGS: Cardiac  silhouette remains mildly enlarged. Thoracic aortic calcification is again seen. There is a small right pleural effusion, decreased in size following interval thoracentesis. There is improved aeration of the right mid and lower lung, although persistent parenchymal opacity remains. Pulmonary vascular congestion is similar to the prior study. No pneumothorax is identified. Degenerative changes are noted at the left shoulder.  IMPRESSION: 1. Decreased size of right pleural effusion following thoracentesis. No pneumothorax 2. Improved right lung aeration.   Electronically Signed   By: Logan Bores   On: 07/19/2014 10:41   Dg Chest Portable 1 View  07/18/2014   CLINICAL DATA:  81 year old female with pleural effusion. Subsequent encounter.  EXAM: PORTABLE CHEST - 1 VIEW  COMPARISON:  Several prior exams most recent 07/17/2014 chest x-ray.  FINDINGS: Persistent consolidation right mid to lower lung zone may represent pleural fluid with adjacent atelectasis. Cannot exclude underlying infiltrate or mass by plain film examination. Please see recent CT report.  Asymmetric pulmonary vascular congestion greater on the right.  No gross pneumothorax.  Cardiomegaly.  Calcified  tortuous aorta.  Shoulder joint degenerative change greater on left.  IMPRESSION: Similar appearance of asymmetric airspace disease greater on the right as detailed above.   Electronically Signed   By: Genia Del M.D.   On: 07/18/2014 08:16   Ir Cholangiogram Existing Tube  07/19/2014   CLINICAL DATA:  Percutaneous transhepatic cholecystostomy, status post ERCP stone removal 07/11/2014.  EXAM: CHOLANGIOGRAM VIA EXISTING CATHETER  COMPARISON:  07/11/2014  FINDINGS: Contrast injection performed of the right upper quadrant cholecystostomy. Residual small filling defects in the gallbladder compatible with cholelithiasis. Cystic duct and common hepatic duct are patent. Contrast refluxes into the biliary radicles. The proximal CBD fills with contrast but despite adequate biliary distension contrast does not pass into the duodenum. Findings compatible with distal CBD obstruction possibly related to inflammation or the recent intervention for stone removal. System was decompressed by syringe aspiration.  IMPRESSION: Cholangiogram through existing cholecystostomy demonstrates distal CBD obstruction. Contrast does not pass into the duodenum.  PLAN: Keep cholecystostomy gravity drainage. Consider repeat injection in 1 week. Follow-up with Dr. Deatra Ina.  These results were called by telephone at the time of interpretation on 07/19/2014 at 9:51 am to Dr. Ferdinand Cava, [facility] , who verbally acknowledged these results.   Electronically Signed   By: Jerilynn Mages.  Shick M.D.   On: 07/19/2014 09:52   US Thoracentesis Asp Pleural Space W/img Guide  07/19/2014   CLINICAL DATA:  Right-sided pleural effusion. Request diagnostic and therapeutic thoracentesis.  EXAM: ULTRASOUND GUIDED RIGHT THORACENTESIS  COMPARISON:  None.  PROCEDURE: An ultrasound guided thoracentesis was thoroughly discussed with the patient and questions answered. The benefits, risks, alternatives and complications were also discussed. The patient understands and wishes to  proceed with the procedure. Written consent was obtained.  Ultrasound demonstrates multiloculated effusion on the right. There are numerous filmy web-like septations, however adequate fluid for thoracentesis.  Ultrasound was performed to localize and mark an adequate pocket of fluid in the right chest. The area was then prepped and draped in the normal sterile fashion. 1% Lidocaine was used for local anesthesia. Under ultrasound guidance a 19 gauge Yueh catheter was introduced. Thoracentesis was performed. The catheter was removed and a dressing applied.  COMPLICATIONS: None immediate.  FINDINGS: A total of approximately 680 mL of clear yellow fluid was removed. A fluid sample wassent for laboratory analysis.  IMPRESSION: Successful ultrasound guided right thoracentesis yielding 680 mL of pleural fluid.  Read by: Ascencion Dike  PA-C   Electronically Signed   By: Jerilynn Mages.  Shick M.D.   On: 07/19/2014 10:22    ENDOSCOPIC STUDIES: 07/11/2014 ERCP with removal of calculus/calculi with sphincterotomy/papillotomy   IMPRESSION:   *  Hx acute cholecystitis.  Treated with perc biliary drain 05/11/14.  Initially thought to be acalculous but developed choledocholithiasis requiring ERCP/Sphinct and stone removal 4/19. Tolerated clamping of tube as of 4/25 but today's cholangiogram raising ? Of recurrent CBD obstruction and stones.  Has incidentally developed pain in RUQ post c-gram.  *  Right pleural effusion.  S/p thoracentesis x 2 this admission, latest was today.  *  Diastolic CHF.  Admissions for CAP and HCAP in 02/2014.   *  Chronic A fib, not on AC due to risk of falls.   *  Rectal cancer and LAR/ostomy in 1980s.  Recurrent cancer with right colectomy 2002.   PLAN:     *  Per Dr Despina Hick  07/19/2014, 11:50 AM Pager: [phone]

## 2014-07-19 NOTE — Progress Notes (Addendum)
[facility] PROGRESS NOTE  ELORA WOLTER RAQ:[id number] DOB: 01/29/1926 DOA: 07/14/2014 PCP: Abigail Miyamoto, MD  Brief Summary  [name] is a 81 y.o. female, with a complex past medical history including chronic kidney disease (baseline creatinine 2.3-2.6), chronic diastolic heart failure with pulmonary hypertension (last EF in February 2016 was 65-70%), diabetes mellitus, moderate to severe aortic stenosis, atrial fibrillation (not on Coumadin), colon cancer 2 status post colostomy, and recent acalculous cholecystitis status post perc cholecystostomy tube placement.  She underwent ERCP with stone extraction on 4/19.  She presented to the ER on 4/22 with increasing dyspnea, lower extremity swelling, and low grade fever.  In the emergency department she was mildly hypoxic 88% on room air and CXR demonstrated an enlarged right-sided moderate pleural effusion.  PCCM was consulted and she underwent thoracentesis.  Pleural fluid was exudative by Lights criteria, however, culture was negative.  Suspect she has some transudative component also given her heart failure.  Her repeat CXR demonstrated worsening effusion and pulmonary edema and her diuretics were escalated.  She will undergo repeat thoracentesis today.  Increasing her lasix to 120mg  IV BID today also.  Her cholecystostomy tube was clamped in preparation for removal.    Assessment/Plan  Acute hypoxic respiratory failure secondary to RIGHT exudative pleural effusion likely caused by recent ERCP with stone extraction  -   Underwent thoracentesis on 4/22 with removal of 1.5L cloudy pleural fluid by pulmonology  -  Exudative by light's criteria by total protein and LDH, Cytology:  No malignant cells, + acute inflammatory and reactive mesothelial cells, Pleural fluid culture NGTD,ANA, RF pending -   F/u CT chest demonstrated moderate right sided pleural effusion with small left pleural effusion, no overt edema or lobar pneumonia -  CXR 4/23,  4/25  with persistent effusion and atelectasis vs. consolidation  - Started levofloxacin for PNA treatment (also covers the citrobacter found in her abscessed gallbladder) per PCCM recs, day 2 -  IR consult for thoracentesis on 4/27 with 680cc fluids removal, -  CXR in a few days and if fluid reaccumulates, PCCM recommending pleurx  Acute on chronic diastolic CHF, persistent LEE -  Last echo shows EF of 65 - 70 in 04/2014. -  BNP 526.1 (troponin 0.03) -  Increase to lasix 120 mg IV BID -  Weight down  -  No lower extremity edema, monitor cr  DM II 05/10/2014 hemoglobin A1c 6.8, CBG at goal but mildly low this AM -  Hold oral medications.  -  D/c Lantus  -  Continue SSI  Chronic A Fib, CHADS-VASc= 5 -  Rate controlled with some 2.5 second pauses -  Not a candidate for anticoagulation due to falls. -  D/c'd telemetry  CAD based on CT scan 4/22 -   Continue aspirin, BB, change to high dose statin  CKD stage IV, baseline creatinine 2.5-3, at baseline -  Trend creatinine with diuresis -  Minimize nephrotoxins and renally dose medications  Acute Cholecystitis in 04/2014 s/p percutaneous cholecystostomy tube placement  -  Underwent outpatient ERCP on 4/19. Lipase is 38. LFTs WNL. -  Discussed case with Dr. Pyrtle/Dr. Deatra Ina -  Perc chole clamped on 4/24 -  IR with cholangiogram , no filling of duodenum, GI consulted , repeat ercp on 4/29 if persistent drainage from chlecystectomty tube.  Chronic back pain. -  Continue Hydrocodone at home dose.  Hypertension, BP stable -  Continue carvedilol to 3.125mg  BID  Hx of colon cancer 2002 s/p right  hemicolectomy and colostomy, BM firm -  Continue miralax  Normocytic anemia, previously iron deficient -  Iron studies c/w inflammation as ferritin quite elevated, b12 1627, folate 8.7, TSH 0.935 - hgb stable around 8.2-8.3  Diet:  diabetic Access:  PIV IVF:  off Proph:  heparin  Code Status: DNR Family Communication: patient and  her two daughters Disposition Plan:  pending repeat thoracentesis/ cholecystostomy tube removal/ercp  Consultants:  Pulmonology  GI  IR  Procedures:  Thoracentesis 4/22 with removal of 1.5L cloudy pleural fluid  Thoracentesis 4/27 with 680 cc removal  Cholangiogram on 4/27  Antibiotics:  levaquin from 4/25  HPI/Subjective:  Feels better, after repeat thoracentesis. Denies pain, no sob. Son and daughter in law at bedside.  Objective: Filed Vitals:   07/19/14 0517 07/19/14 0948 07/19/14 1003 07/19/14 1500  BP: 139/62 121/45 120/58 115/44  Pulse: 83   80  Temp: 98.4 F (36.9 C)   97.7 F (36.5 C)  TempSrc: Oral   Oral  Resp: 20   18  Height:      Weight: 75.161 kg (165 lb 11.2 oz)     SpO2: 95%   95%    Intake/Output Summary (Last 24 hours) at 07/19/14 1630 Last data filed at 07/19/14 1529  Gross per 24 hour  Intake    782 ml  Output   1200 ml  Net   -418 ml   Filed Weights   07/17/14 0329 07/18/14 0648 07/19/14 0517  Weight: 76.975 kg (169 lb 11.2 oz) 75.887 kg (167 lb 4.8 oz) 75.161 kg (165 lb 11.2 oz)    Exam:   General:  Average weight female, No acute distress, very pleasant  HEENT:  NCAT, MMM  Cardiovascular:  RRR, nl S1, S2, 3/6 systolic murmur at the left lateral chest   Respiratory:  Diminished on the right , no obvious rales today, diminished at left base, no wheezes, no increased WOB  Abdomen:   NABS, soft, ND, NT, RUQ drain. Chronic colostomy  MSK:   Normal tone and bulk, no edema  Neuro:  Grossly intact  Data Reviewed: Basic Metabolic Panel:  Recent Labs Lab 07/15/14 0505 07/16/14 0702 07/17/14 0518 07/18/14 0538 07/19/14 0442  NA 143 140 140 141 138  K 3.7 3.6 3.8 4.1 3.9  CL 107 106 106 105 101  CO2 26 23 24 22 24   GLUCOSE 99 119* 111* 95 101*  BUN 51* 56* 54* 52* 50*  CREATININE 2.90* 2.93* 2.85* 2.63* 2.75*  CALCIUM 8.4 8.3* 8.4 8.4 8.3*   Liver Function Tests:  Recent Labs Lab 07/14/14 1125 07/15/14 0505  07/17/14 0518 07/18/14 0538 07/19/14 0442  AST 15 10 13 14 14   ALT 8 6 6 8 9   ALKPHOS 117 97 112 110 114  BILITOT 0.6 0.7 0.6 0.7 0.5  PROT 6.4 5.4* 5.4* 5.2* 5.5*  ALBUMIN 2.7* 2.1* 2.2* 2.2* 2.3*    Recent Labs Lab 07/14/14 1125  LIPASE 38   No results for input(s): AMMONIA in the last 168 hours. CBC:  Recent Labs Lab 07/14/14 1125 07/15/14 0505 07/16/14 0702 07/17/14 0518 07/18/14 0538 07/19/14 0442  WBC 9.9 7.7 9.4 8.6 8.8 8.3  NEUTROABS 7.6  --   --   --   --   --   HGB 9.6* 8.2* 8.2* 8.3* 8.3* 8.4*  HCT 30.8* 26.8* 26.2* 26.4* 26.4* 26.8*  MCV 91.4 92.4 92.3 91.3 92.0 90.8  PLT 165 184 174 185 184 183   Cardiac Enzymes:  Recent Labs  Lab 07/14/14 1125  TROPONINI 0.03   BNP (last 3 results)  Recent Labs  03/15/14 0914 07/14/14 1125  BNP 941.4* 526.1*    ProBNP (last 3 results) No results for input(s): PROBNP in the last 8760 hours.  CBG:  Recent Labs Lab 07/18/14 1146 07/18/14 1643 07/18/14 2113 07/19/14 0511 07/19/14 1214  GLUCAP 171* 90 217* 105* 127*    Recent Results (from the past 240 hour(s))  Urine culture     Status: None   Collection Time: 07/14/14  1:14 PM  Result Value Ref Range Status   Specimen Description URINE, RANDOM  Final   Special Requests NONE  Final   Colony Count NO GROWTH Performed at [facility]   Final   Culture NO GROWTH Performed at [facility]   Final   Report Status 07/16/2014 FINAL  Final  Body fluid culture     Status: None   Collection Time: 07/14/14  4:04 PM  Result Value Ref Range Status   Specimen Description FLUID RIGHT PLEURAL  Final   Special Requests Normal  Final   Gram Stain   Final    RARE WBC PRESENT, PREDOMINANTLY PMN NO ORGANISMS SEEN Performed at [facility]    Culture   Final    NO GROWTH 3 DAYS Performed at [facility]    Report Status 07/18/2014 FINAL  Final     Studies: Dg Chest 1 View  07/19/2014   CLINICAL DATA:  Right pleural  effusion status post thoracentesis. Weakness.  EXAM: CHEST  1 VIEW  COMPARISON:  07/18/2014  FINDINGS: Cardiac silhouette remains mildly enlarged. Thoracic aortic calcification is again seen. There is a small right pleural effusion, decreased in size following interval thoracentesis. There is improved aeration of the right mid and lower lung, although persistent parenchymal opacity remains. Pulmonary vascular congestion is similar to the prior study. No pneumothorax is identified. Degenerative changes are noted at the left shoulder.  IMPRESSION: 1. Decreased size of right pleural effusion following thoracentesis. No pneumothorax 2. Improved right lung aeration.   Electronically Signed   By: Logan Bores   On: 07/19/2014 10:41   Dg Chest Portable 1 View  07/18/2014   CLINICAL DATA:  81 year old female with pleural effusion. Subsequent encounter.  EXAM: PORTABLE CHEST - 1 VIEW  COMPARISON:  Several prior exams most recent 07/17/2014 chest x-ray.  FINDINGS: Persistent consolidation right mid to lower lung zone may represent pleural fluid with adjacent atelectasis. Cannot exclude underlying infiltrate or mass by plain film examination. Please see recent CT report.  Asymmetric pulmonary vascular congestion greater on the right.  No gross pneumothorax.  Cardiomegaly.  Calcified tortuous aorta.  Shoulder joint degenerative change greater on left.  IMPRESSION: Similar appearance of asymmetric airspace disease greater on the right as detailed above.   Electronically Signed   By: Genia Del M.D.   On: 07/18/2014 08:16   Ir Cholangiogram Existing Tube  07/19/2014   CLINICAL DATA:  Percutaneous transhepatic cholecystostomy, status post ERCP stone removal 07/11/2014.  EXAM: CHOLANGIOGRAM VIA EXISTING CATHETER  COMPARISON:  07/11/2014  FINDINGS: Contrast injection performed of the right upper quadrant cholecystostomy. Residual small filling defects in the gallbladder compatible with cholelithiasis. Cystic duct and common  hepatic duct are patent. Contrast refluxes into the biliary radicles. The proximal CBD fills with contrast but despite adequate biliary distension contrast does not pass into the duodenum. Findings compatible with distal CBD obstruction possibly related to inflammation or the recent intervention for stone removal.  System was decompressed by syringe aspiration.  IMPRESSION: Cholangiogram through existing cholecystostomy demonstrates distal CBD obstruction. Contrast does not pass into the duodenum.  PLAN: Keep cholecystostomy gravity drainage. Consider repeat injection in 1 week. Follow-up with Dr. Deatra Ina.  These results were called by telephone at the time of interpretation on 07/19/2014 at 9:51 am to Dr. Ferdinand Cava, [facility] , who verbally acknowledged these results.   Electronically Signed   By: Jerilynn Mages.  Shick M.D.   On: 07/19/2014 09:52   US Thoracentesis Asp Pleural Space W/img Guide  07/19/2014   CLINICAL DATA:  Right-sided pleural effusion. Request diagnostic and therapeutic thoracentesis.  EXAM: ULTRASOUND GUIDED RIGHT THORACENTESIS  COMPARISON:  None.  PROCEDURE: An ultrasound guided thoracentesis was thoroughly discussed with the patient and questions answered. The benefits, risks, alternatives and complications were also discussed. The patient understands and wishes to proceed with the procedure. Written consent was obtained.  Ultrasound demonstrates multiloculated effusion on the right. There are numerous filmy web-like septations, however adequate fluid for thoracentesis.  Ultrasound was performed to localize and mark an adequate pocket of fluid in the right chest. The area was then prepped and draped in the normal sterile fashion. 1% Lidocaine was used for local anesthesia. Under ultrasound guidance a 19 gauge Yueh catheter was introduced. Thoracentesis was performed. The catheter was removed and a dressing applied.  COMPLICATIONS: None immediate.  FINDINGS: A total of approximately 680 mL of clear yellow  fluid was removed. A fluid sample wassent for laboratory analysis.  IMPRESSION: Successful ultrasound guided right thoracentesis yielding 680 mL of pleural fluid.  Read by: Ascencion Dike PA-C   Electronically Signed   By: Jerilynn Mages.  Shick M.D.   On: 07/19/2014 10:22    Scheduled Meds: . aspirin EC  81 mg Oral Daily  . atorvastatin  40 mg Oral q1800  . calcitRIOL  0.25 mcg Oral Daily  . carvedilol  3.125 mg Oral BID WC  . docusate sodium  100 mg Oral BID  . furosemide  120 mg Intravenous BID  . heparin  5,000 Units Subcutaneous 3 times per day  . insulin aspart  0-9 Units Subcutaneous TID WC  . levofloxacin (LEVAQUIN) IV  500 mg Intravenous Q48H  . lidocaine (PF)      . LORazepam  0.5 mg Oral BID  . mirtazapine  7.5 mg Oral QHS  . polyethylene glycol  17 g Oral Daily  . saccharomyces boulardii  250 mg Oral BID  . sodium chloride  3 mL Intravenous Q12H  . sodium chloride  3 mL Intravenous Q12H  . vitamin B-12  1,000 mcg Oral Daily   Continuous Infusions:   Principal Problem:   Acute respiratory failure with hypoxia Active Problems:   Anemia   Diabetes mellitus, type II   Hypertension   Chronic atrial fibrillation   Chronic diastolic CHF (congestive heart failure)   CKD (chronic kidney disease), stage IV   Moderate aortic stenosis   Type 2 diabetes mellitus with diabetic chronic kidney disease   Acute on chronic diastolic CHF (congestive heart failure)   Choledocholithiasis with acute cholecystitis   Pleural effusion, right   Pleural effusion   Lung mass   DNR (do not resuscitate)    Time spent: 75 min    Jon Kasparek  [facility] Pager [phone]. If 7PM-7AM, please contact night-coverage at [email or web address], password [facility] 07/19/2014, 4:30 PM  LOS: 5 days

## 2014-07-19 NOTE — Procedures (Signed)
Multiloculated right effusion with multiple filmy septations. Successful US guided right thoracentesis. Yielded 671mL of clear yellow fluid. Pt tolerated procedure well. No immediate complications.  Specimen was sent for labs. CXR ordered.  Ascencion Dike PA-C 07/19/2014 10:18 AM

## 2014-07-19 NOTE — Progress Notes (Signed)
Pt resting in bed quietly with family members at the bedside.  A&0x4.  Denies any pain.  Drainage bag at Rt. Side of abd with greenish drainage.   Call bell at bedside and instructed to call as needed.  Verbalized understanding.  Karie Kirks, Therapist, sports.

## 2014-07-20 DIAGNOSIS — J90 Pleural effusion, not elsewhere classified: Secondary | ICD-10-CM | POA: Insufficient documentation

## 2014-07-20 DIAGNOSIS — N184 Chronic kidney disease, stage 4 (severe): Secondary | ICD-10-CM

## 2014-07-20 LAB — COMPREHENSIVE METABOLIC PANEL
ALK PHOS: 119 U/L — AB (ref 39–117)
ALT: 8 U/L (ref 0–35)
AST: 18 U/L (ref 0–37)
Albumin: 2.2 g/dL — ABNORMAL LOW (ref 3.5–5.2)
Anion gap: 11 (ref 5–15)
BUN: 46 mg/dL — AB (ref 6–23)
CO2: 27 mmol/L (ref 19–32)
Calcium: 8.3 mg/dL — ABNORMAL LOW (ref 8.4–10.5)
Chloride: 100 mmol/L (ref 96–112)
Creatinine, Ser: 2.84 mg/dL — ABNORMAL HIGH (ref 0.50–1.10)
GFR, EST AFRICAN AMERICAN: 16 mL/min — AB (ref >90)
GFR, EST NON AFRICAN AMERICAN: 14 mL/min — AB (ref >90)
GLUCOSE: 129 mg/dL — AB (ref 70–99)
POTASSIUM: 4 mmol/L (ref 3.5–5.1)
SODIUM: 138 mmol/L (ref 135–145)
Total Bilirubin: 0.3 mg/dL (ref 0.3–1.2)
Total Protein: 5.4 g/dL — ABNORMAL LOW (ref 6.0–8.3)

## 2014-07-20 LAB — CBC
HCT: 28.6 % — ABNORMAL LOW (ref 36.0–46.0)
Hemoglobin: 8.8 g/dL — ABNORMAL LOW (ref 12.0–15.0)
MCH: 28.1 pg (ref 26.0–34.0)
MCHC: 30.8 g/dL (ref 30.0–36.0)
MCV: 91.4 fL (ref 78.0–100.0)
Platelets: 188 10*3/uL (ref 150–400)
RBC: 3.13 MIL/uL — ABNORMAL LOW (ref 3.87–5.11)
RDW: 15.1 % (ref 11.5–15.5)
WBC: 8.6 10*3/uL (ref 4.0–10.5)

## 2014-07-20 LAB — GLUCOSE, CAPILLARY
GLUCOSE-CAPILLARY: 114 mg/dL — AB (ref 70–99)
Glucose-Capillary: 121 mg/dL — ABNORMAL HIGH (ref 70–99)
Glucose-Capillary: 160 mg/dL — ABNORMAL HIGH (ref 70–99)
Glucose-Capillary: 150 mg/dL — ABNORMAL HIGH (ref 70–99)
Glucose-Capillary: 154 mg/dL — ABNORMAL HIGH (ref 70–99)

## 2014-07-20 MED ORDER — FUROSEMIDE 80 MG PO TABS
80.0000 mg | ORAL_TABLET | Freq: Every morning | ORAL | Status: DC
  Administered 2014-07-21: 80 mg via ORAL
  Filled 2014-07-20: qty 1

## 2014-07-20 MED ORDER — LEVOFLOXACIN 500 MG PO TABS
500.0000 mg | ORAL_TABLET | ORAL | Status: AC
  Administered 2014-07-21 – 2014-07-23 (×2): 500 mg via ORAL
  Filled 2014-07-20 (×3): qty 1

## 2014-07-20 NOTE — Progress Notes (Signed)
Physical Therapy Treatment Patient Details Name: [name] MRN: [id number] DOB: [birth year] Today's Date: 07/20/2014    History of Present Illness Patient presents to the ER from home by EMS. Patient has been experiencing shortness of breath for the last 3 days. Patient does have a history of congestive heart failure, has increased swelling of her legs. Recently underwent ERCP On 4/19 for retained bile duct stones and has cystostomy tube.     PT Comments    Pt admitted with above diagnosis. Pt currently with functional limitations due to strength and endurance deficits. Pt able to ambulate with RW with fairly good stability and safety.  Informed nursing that pt could ambulate on unit with RW with daughter.  Daughter and pt agreeable.  Should continue to progrress well.  Will treat pt at least one more visit if still here next week to ensure that mobility is back to baseline.   Pt will benefit from skilled PT to increase their independence and safety with mobility to allow discharge to the venue listed below.    Follow Up Recommendations  No PT follow up     Equipment Recommendations  None recommended by PT    Recommendations for Other Services       Precautions / Restrictions Precautions Precautions: Fall Precaution Comments: cystostomy drainage bag and a colostomy Restrictions Weight Bearing Restrictions: No    Mobility  Bed Mobility               General bed mobility comments: in chair on arrival.  Transfers Overall transfer level: Needs assistance Equipment used: Rolling walker (2 wheeled) Transfers: Sit to/from Stand Sit to Stand: Supervision         General transfer comment: Cues to initiate; definite reliance on UE support  Ambulation/Gait Ambulation/Gait assistance: Min guard Ambulation Distance (Feet): 150 Feet Assistive device: Rolling walker (2 wheeled) Gait Pattern/deviations: Step-through pattern;Decreased stride length;Trunk flexed   Gait  velocity interpretation: Below normal speed for age/gender General Gait Details: walks with her RW a bit too far in front but is safe, likely a function of her regularly using rollator RW   Stairs            Wheelchair Mobility    Modified Rankin (Stroke Patients Only)       Balance Overall balance assessment: No apparent balance deficits (not formally assessed)                                  Cognition Arousal/Alertness: Awake/alert Behavior During Therapy: WFL for tasks assessed/performed Overall Cognitive Status: Within Functional Limits for tasks assessed                      Exercises General Exercises - Lower Extremity Ankle Circles/Pumps: AROM;Both;10 reps;Seated Long Arc Quad: AROM;Both;10 reps;Seated    General Comments        Pertinent Vitals/Pain Pain Assessment: No/denies pain  Sats 95% and greater on RA with ambulation.     Home Living                      Prior Function            PT Goals (current goals can now be found in the care plan section) Progress towards PT goals: Progressing toward goals    Frequency  Min 3X/week    PT Plan Current plan remains appropriate    Co-evaluation  End of Session Equipment Utilized During Treatment: Gait belt Activity Tolerance: Patient tolerated treatment well Patient left: with family/visitor present     Time: 3016-0109 PT Time Calculation (min) (ACUTE ONLY): 21 min  Charges:  $Gait Training: 8-22 mins                    G Codes:      Denice Paradise 14-Aug-2014, 2:13 PM [place] Daltin Crist,PT Acute Rehabilitation [phone] (pager)

## 2014-07-20 NOTE — Progress Notes (Signed)
Daily Rounding Note  07/20/2014, 8:22 AM  LOS: 6 days   SUBJECTIVE:       Biliary drainage 140 cc yesterday.  Has not required hydrocodone.  Some drain site region discomfort with deep breathing.  No nausea.  Eating 50 to 100% of meals.  Gets to bathroom independently with walker  OBJECTIVE:         Vital signs in last 24 hours:    Temp:  [97.7 F (36.5 C)-98.5 F (36.9 C)] 98.4 F (36.9 C) (04/28 0536) Pulse Rate:  [67-82] 82 (04/28 0536) Resp:  [16-18] 18 (04/27 2219) BP: (114-132)/(44-58) 124/53 mmHg (04/28 0536) SpO2:  [94 %-97 %] 97 % (04/28 0536) Weight:  [165 lb 6.4 oz (75.025 kg)] 165 lb 6.4 oz (75.025 kg) (04/28 0536) Last BM Date: 07/19/14 Filed Weights   07/18/14 0648 07/19/14 0517 07/20/14 0536  Weight: 167 lb 4.8 oz (75.887 kg) 165 lb 11.2 oz (75.161 kg) 165 lb 6.4 oz (75.025 kg)   General: pleasant, comfortable, alert.  Looks frail but not ill looking   Heart: RRR Chest: clear bil but reduced/absent BS on right base.  No dyspnea or cough.  Abdomen: soft, hypoactive BS.  Slight tenderness in region of perc drain at RUQ/flank.  Bandage dry, clean.  No bile in drain bag  Extremities: no CCE Neuro/Psych:  Pleasant, oriented x 3.  No gross deficits.   Intake/Output from previous day: 04/27 0701 - 04/28 0700 In: 1042 [P.O.:918; IV Piggyback:124] Out: 2240 [Urine:2100; Drains:140]  Intake/Output this shift:    Lab Results:  Recent Labs  07/18/14 0538 07/19/14 0442 07/20/14 0356  WBC 8.8 8.3 8.6  HGB 8.3* 8.4* 8.8*  HCT 26.4* 26.8* 28.6*  PLT 184 183 188   BMET  Recent Labs  07/18/14 0538 07/19/14 0442 07/20/14 0356  NA 141 138 138  K 4.1 3.9 4.0  CL 105 101 100  CO2 $Re'22 24 27  'nCU$ GLUCOSE 95 101* 129*  BUN 52* 50* 46*  CREATININE 2.63* 2.75* 2.84*  CALCIUM 8.4 8.3* 8.3*   LFT  Recent Labs  07/18/14 0538 07/19/14 0442 07/20/14 0356  PROT 5.2* 5.5* 5.4*  ALBUMIN 2.2* 2.3* 2.2*    AST $Re'14 14 18  'OVM$ ALT $R'8 9 8  'xm$ ALKPHOS 110 114 119*  BILITOT 0.7 0.5 0.3   PT/INR No results for input(s): LABPROT, INR in the last 72 hours. Hepatitis Panel No results for input(s): HEPBSAG, HCVAB, HEPAIGM, HEPBIGM in the last 72 hours.  Studies/Results: Dg Chest 1 View  07/19/2014   CLINICAL DATA:  Right pleural effusion status post thoracentesis. Weakness.  EXAM: CHEST  1 VIEW  COMPARISON:  07/18/2014  FINDINGS: Cardiac silhouette remains mildly enlarged. Thoracic aortic calcification is again seen. There is a small right pleural effusion, decreased in size following interval thoracentesis. There is improved aeration of the right mid and lower lung, although persistent parenchymal opacity remains. Pulmonary vascular congestion is similar to the prior study. No pneumothorax is identified. Degenerative changes are noted at the left shoulder.  IMPRESSION: 1. Decreased size of right pleural effusion following thoracentesis. No pneumothorax 2. Improved right lung aeration.   Electronically Signed   By: Logan Bores   On: 07/19/2014 10:41   Ir Cholangiogram Existing Tube  07/19/2014   CLINICAL DATA:  Percutaneous transhepatic cholecystostomy, status post ERCP stone removal 07/11/2014.  EXAM: CHOLANGIOGRAM VIA EXISTING CATHETER  COMPARISON:  07/11/2014  FINDINGS: Contrast injection performed of the right upper quadrant  cholecystostomy. Residual small filling defects in the gallbladder compatible with cholelithiasis. Cystic duct and common hepatic duct are patent. Contrast refluxes into the biliary radicles. The proximal CBD fills with contrast but despite adequate biliary distension contrast does not pass into the duodenum. Findings compatible with distal CBD obstruction possibly related to inflammation or the recent intervention for stone removal. System was decompressed by syringe aspiration.  IMPRESSION: Cholangiogram through existing cholecystostomy demonstrates distal CBD obstruction. Contrast does not  pass into the duodenum.  PLAN: Keep cholecystostomy gravity drainage. Consider repeat injection in 1 week. Follow-up with Dr. Deatra Ina.  These results were called by telephone at the time of interpretation on 07/19/2014 at 9:51 am to Dr. Ferdinand Cava, [facility] , who verbally acknowledged these results.   Electronically Signed   By: Jerilynn Mages.  Shick M.D.   On: 07/19/2014 09:52   US Thoracentesis Asp Pleural Space W/img Guide  07/19/2014   CLINICAL DATA:  Right-sided pleural effusion. Request diagnostic and therapeutic thoracentesis.  EXAM: ULTRASOUND GUIDED RIGHT THORACENTESIS  COMPARISON:  None.  PROCEDURE: An ultrasound guided thoracentesis was thoroughly discussed with the patient and questions answered. The benefits, risks, alternatives and complications were also discussed. The patient understands and wishes to proceed with the procedure. Written consent was obtained.  Ultrasound demonstrates multiloculated effusion on the right. There are numerous filmy web-like septations, however adequate fluid for thoracentesis.  Ultrasound was performed to localize and mark an adequate pocket of fluid in the right chest. The area was then prepped and draped in the normal sterile fashion. 1% Lidocaine was used for local anesthesia. Under ultrasound guidance a 19 gauge Yueh catheter was introduced. Thoracentesis was performed. The catheter was removed and a dressing applied.  COMPLICATIONS: None immediate.  FINDINGS: A total of approximately 680 mL of clear yellow fluid was removed. A fluid sample wassent for laboratory analysis.  IMPRESSION: Successful ultrasound guided right thoracentesis yielding 680 mL of pleural fluid.  Read by: Ascencion Dike PA-C   Electronically Signed   By: Jerilynn Mages.  Shick M.D.   On: 07/19/2014 10:22   Scheduled Meds: . aspirin EC  81 mg Oral Daily  . atorvastatin  40 mg Oral q1800  . calcitRIOL  0.25 mcg Oral Daily  . carvedilol  3.125 mg Oral BID WC  . docusate sodium  100 mg Oral BID  . furosemide  120 mg  Intravenous BID  . heparin  5,000 Units Subcutaneous 3 times per day  . insulin aspart  0-9 Units Subcutaneous TID WC  . levofloxacin (LEVAQUIN) IV  500 mg Intravenous Q48H  . LORazepam  0.5 mg Oral BID  . mirtazapine  7.5 mg Oral QHS  . polyethylene glycol  17 g Oral Daily  . saccharomyces boulardii  250 mg Oral BID  . sodium chloride  3 mL Intravenous Q12H  . sodium chloride  3 mL Intravenous Q12H  . vitamin B-12  1,000 mcg Oral Daily   Continuous Infusions:  PRN Meds:.sodium chloride, acetaminophen **OR** acetaminophen, clotrimazole-betamethasone, docusate sodium, HYDROcodone-acetaminophen, meclizine, MUSCLE RUB, ondansetron **OR** ondansetron (ZOFRAN) IV, promethazine, sodium chloride   ASSESMENT:   * Hx acute cholecystitis. Treated with perc biliary drain 05/11/14. Initially thought to be acalculous but developed choledocholithiasis requiring ERCP/Sphinct and stone removal 4/19. Tolerated clamping of tube as of 4/25 but today's cholangiogram raising ? Of recurrent CBD obstruction and stones. alk phos rising slightly. Clinically pt feels better overall but still not feeling well.   * Right pleural effusion. S/p thoracentesis x 2 this admission, latest on  4/27.  On Levaquin for PNA.   * Diastolic CHF. Admissions for CAP and HCAP in 02/2014.   * Chronic A fib, not on AC due to risk of falls.   * Rectal cancer and LAR/ostomy in 1980s. Recurrent cancer with right colectomy 2002.   *  CKD.  Stage 4.    PLAN   *  Per Dr Despina Hick  07/20/2014, 8:22 AM Pager: [phone]

## 2014-07-20 NOTE — Progress Notes (Signed)
[facility] PROGRESS NOTE  ANGELIZ SETTLEMYRE XKG:[id number] DOB: [birth year] DOA: 07/14/2014 PCP: Abigail Miyamoto, MD  Brief Summary  Elisse Pennick is a 81 y.o. female, with a complex past medical history including chronic kidney disease (baseline creatinine 2.3-2.6), chronic diastolic heart failure with pulmonary hypertension (last EF in February 2016 was 65-70%), diabetes mellitus, moderate to severe aortic stenosis, atrial fibrillation (not on Coumadin), colon cancer 2 status post colostomy, and recent acalculous cholecystitis status post perc cholecystostomy tube placement.  She underwent ERCP with stone extraction on 4/19.  She presented to the ER on 4/22 with increasing dyspnea, lower extremity swelling, and low grade fever.  In the emergency department she was mildly hypoxic 88% on room air and CXR demonstrated an enlarged right-sided moderate pleural effusion.  PCCM was consulted and she underwent thoracentesis.  Pleural fluid was exudative by Lights criteria, however, culture was negative.  Suspect she has some transudative component also given her heart failure.  Her repeat CXR demonstrated worsening effusion and pulmonary edema and her diuretics were escalated.  She will undergo repeat thoracentesis today.  Increasing her lasix to 120mg  IV BID today also.  Her cholecystostomy tube was clamped in preparation for removal.    Assessment/Plan  Acute hypoxic respiratory failure secondary to RIGHT exudative pleural effusion likely related to ongoing gallbladder inflammation, but will eventually need Ct chest to r/o underline malignancy. -   Underwent thoracentesis on 4/22 with removal of 1.5L cloudy pleural fluid by pulmonology  -  Exudative by light's criteria by total protein and LDH, Cytology:  No malignant cells, + acute inflammatory and reactive mesothelial cells, Pleural fluid culture NGTD,ANA, RF pending -   F/u CT chest demonstrated moderate right sided pleural effusion with small left  pleural effusion, no overt edema or lobar pneumonia -  CXR 4/23, 4/25  with persistent effusion and atelectasis vs. consolidation  - Started levofloxacin for PNA treatment (also covers the citrobacter found in her abscessed gallbladder) per PCCM recs -  IR consult for thoracentesis on 4/27 with 680cc fluids removal, -  CXR on 4/30 and if fluid reaccumulates, PCCM recommending pleurx  Acute on chronic diastolic CHF, persistent LEE -  Last echo shows EF of 65 - 70 in 04/2014. -  BNP 526.1 (troponin 0.03) -  Weight down  -  No lower extremity edema, monitor cr - received lasix 120 mg IV BID, c/o dizzy standing up on 4/28, lasix dose decreased to home dose.  DM II 05/10/2014 hemoglobin A1c 6.8, CBG at goal but mildly low this AM -  Hold oral medications.  -  D/c Lantus  -  Continue SSI  Chronic A Fib, CHADS-VASc= 5 -  Rate controlled with some 2.5 second pauses -  Not a candidate for anticoagulation due to falls. -  D/c'd telemetry  CAD based on CT scan 4/22 -   Continue aspirin, BB, change to high dose statin  CKD stage IV, baseline creatinine 2.5-3, at baseline -  Trend creatinine with diuresis -  Minimize nephrotoxins and renally dose medications  Acute Cholecystitis in 04/2014 s/p percutaneous cholecystostomy tube placement  -  Underwent outpatient ERCP on 4/19. Lipase is 38. LFTs WNL. -  Discussed case with Dr. Pyrtle/Dr. Deatra Ina -  Perc chole clamped on 4/24 -  IR with cholangiogram , no filling of duodenum, GI consulted , repeat ercp on 4/29 to r/o bile duce obstruction.  Chronic back pain. -  Continue Hydrocodone at home dose.  Hypertension, BP stable -  Continue  carvedilol to 3.125mg  BID  Hx of colon cancer 2002 s/p right hemicolectomy and colostomy, BM firm -  Continue miralax  Normocytic anemia, previously iron deficient -  Iron studies c/w inflammation as ferritin quite elevated, b12 1627, folate 8.7, TSH 0.935 - hgb stable around 8.2-8.3  Diet:   diabetic Access:  PIV IVF:  off Proph:  heparin  Code Status: DNR Family Communication: patient/daughter Disposition Plan:  Remain inpatient  Consultants:  Pulmonology  GI  IR  Procedures:  Thoracentesis 4/22 with removal of 1.5L cloudy pleural fluid  Thoracentesis 4/27 with 680 cc removal  Cholangiogram on 4/27  ERCP 4/29  Antibiotics:  levaquin from 4/25  HPI/Subjective:  Reported feeling dizzy when standing up, denies sob, no chest pain, sitting in chair, daughter in room.   Objective: Filed Vitals:   07/19/14 2219 07/20/14 0536 07/20/14 1015 07/20/14 1433  BP: 118/46 124/53 122/66 117/56  Pulse: 73 82 70 70  Temp: 98.5 F (36.9 C) 98.4 F (36.9 C)  98.1 F (36.7 C)  TempSrc: Oral Oral  Oral  Resp: 18     Height:      Weight:  75.025 kg (165 lb 6.4 oz)    SpO2: 96% 97%  98%    Intake/Output Summary (Last 24 hours) at 07/20/14 1453 Last data filed at 07/20/14 1448  Gross per 24 hour  Intake   1282 ml  Output   2340 ml  Net  -1058 ml   Filed Weights   07/18/14 0648 07/19/14 0517 07/20/14 0536  Weight: 75.887 kg (167 lb 4.8 oz) 75.161 kg (165 lb 11.2 oz) 75.025 kg (165 lb 6.4 oz)    Exam:   General:  Average weight female, No acute distress, very pleasant  HEENT:  NCAT, MMM  Cardiovascular:  RRR, nl S1, S2, 3/6 systolic murmur at the left lateral chest   Respiratory:  Diminished on the right , no obvious rales today, diminished at left base, no wheezes, no increased WOB  Abdomen:   NABS, soft, ND, NT, RUQ drain. Chronic colostomy  MSK:   Normal tone and bulk, no edema  Neuro:  Grossly intact  Data Reviewed: Basic Metabolic Panel:  Recent Labs Lab 07/16/14 0702 07/17/14 0518 07/18/14 0538 07/19/14 0442 07/20/14 0356  NA 140 140 141 138 138  K 3.6 3.8 4.1 3.9 4.0  CL 106 106 105 101 100  CO2 23 24 22 24 27   GLUCOSE 119* 111* 95 101* 129*  BUN 56* 54* 52* 50* 46*  CREATININE 2.93* 2.85* 2.63* 2.75* 2.84*  CALCIUM 8.3* 8.4  8.4 8.3* 8.3*   Liver Function Tests:  Recent Labs Lab 07/15/14 0505 07/17/14 0518 07/18/14 0538 07/19/14 0442 07/20/14 0356  AST 10 13 14 14 18   ALT 6 6 8 9 8   ALKPHOS 97 112 110 114 119*  BILITOT 0.7 0.6 0.7 0.5 0.3  PROT 5.4* 5.4* 5.2* 5.5* 5.4*  ALBUMIN 2.1* 2.2* 2.2* 2.3* 2.2*    Recent Labs Lab 07/14/14 1125  LIPASE 38   No results for input(s): AMMONIA in the last 168 hours. CBC:  Recent Labs Lab 07/14/14 1125  07/16/14 0702 07/17/14 0518 07/18/14 0538 07/19/14 0442 07/20/14 0356  WBC 9.9  < > 9.4 8.6 8.8 8.3 8.6  NEUTROABS 7.6  --   --   --   --   --   --   HGB 9.6*  < > 8.2* 8.3* 8.3* 8.4* 8.8*  HCT 30.8*  < > 26.2* 26.4* 26.4* 26.8* 28.6*  MCV 91.4  < > 92.3 91.3 92.0 90.8 91.4  PLT 165  < > 174 185 184 183 188  < > = values in this interval not displayed. Cardiac Enzymes:  Recent Labs Lab 07/14/14 1125  TROPONINI 0.03   BNP (last 3 results)  Recent Labs  03/15/14 0914 07/14/14 1125  BNP 941.4* 526.1*    ProBNP (last 3 results) No results for input(s): PROBNP in the last 8760 hours.  CBG:  Recent Labs Lab 07/19/14 1714 07/19/14 2216 07/20/14 0556 07/20/14 0649 07/20/14 1119  GLUCAP 189* 91 121* 114* 160*    Recent Results (from the past 240 hour(s))  Urine culture     Status: None   Collection Time: 07/14/14  1:14 PM  Result Value Ref Range Status   Specimen Description URINE, RANDOM  Final   Special Requests NONE  Final   Colony Count NO GROWTH Performed at [facility]   Final   Culture NO GROWTH Performed at [facility]   Final   Report Status 07/16/2014 FINAL  Final  Body fluid culture     Status: None   Collection Time: 07/14/14  4:04 PM  Result Value Ref Range Status   Specimen Description FLUID RIGHT PLEURAL  Final   Special Requests Normal  Final   Gram Stain   Final    RARE WBC PRESENT, PREDOMINANTLY PMN NO ORGANISMS SEEN Performed at [facility]    Culture   Final    NO  GROWTH 3 DAYS Performed at [facility]    Report Status 07/18/2014 FINAL  Final  Body fluid culture     Status: None (Preliminary result)   Collection Time: 07/19/14  9:59 AM  Result Value Ref Range Status   Specimen Description FLUID PLEURAL RIGHT  Final   Special Requests NONE  Final   Gram Stain   Final    NO WBC SEEN NO ORGANISMS SEEN Performed at [facility]    Culture NO GROWTH Performed at [facility]   Final   Report Status PENDING  Incomplete     Studies: Dg Chest 1 View  07/19/2014   CLINICAL DATA:  Right pleural effusion status post thoracentesis. Weakness.  EXAM: CHEST  1 VIEW  COMPARISON:  07/18/2014  FINDINGS: Cardiac silhouette remains mildly enlarged. Thoracic aortic calcification is again seen. There is a small right pleural effusion, decreased in size following interval thoracentesis. There is improved aeration of the right mid and lower lung, although persistent parenchymal opacity remains. Pulmonary vascular congestion is similar to the prior study. No pneumothorax is identified. Degenerative changes are noted at the left shoulder.  IMPRESSION: 1. Decreased size of right pleural effusion following thoracentesis. No pneumothorax 2. Improved right lung aeration.   Electronically Signed   By: Logan Bores   On: 07/19/2014 10:41   Ir Cholangiogram Existing Tube  07/19/2014   CLINICAL DATA:  Percutaneous transhepatic cholecystostomy, status post ERCP stone removal 07/11/2014.  EXAM: CHOLANGIOGRAM VIA EXISTING CATHETER  COMPARISON:  07/11/2014  FINDINGS: Contrast injection performed of the right upper quadrant cholecystostomy. Residual small filling defects in the gallbladder compatible with cholelithiasis. Cystic duct and common hepatic duct are patent. Contrast refluxes into the biliary radicles. The proximal CBD fills with contrast but despite adequate biliary distension contrast does not pass into the duodenum. Findings compatible with distal CBD  obstruction possibly related to inflammation or the recent intervention for stone removal. System was decompressed by syringe aspiration.  IMPRESSION: Cholangiogram  through existing cholecystostomy demonstrates distal CBD obstruction. Contrast does not pass into the duodenum.  PLAN: Keep cholecystostomy gravity drainage. Consider repeat injection in 1 week. Follow-up with Dr. Deatra Ina.  These results were called by telephone at the time of interpretation on 07/19/2014 at 9:51 am to Dr. Ferdinand Cava, [facility] , who verbally acknowledged these results.   Electronically Signed   By: Jerilynn Mages.  Shick M.D.   On: 07/19/2014 09:52   US Thoracentesis Asp Pleural Space W/img Guide  07/19/2014   CLINICAL DATA:  Right-sided pleural effusion. Request diagnostic and therapeutic thoracentesis.  EXAM: ULTRASOUND GUIDED RIGHT THORACENTESIS  COMPARISON:  None.  PROCEDURE: An ultrasound guided thoracentesis was thoroughly discussed with the patient and questions answered. The benefits, risks, alternatives and complications were also discussed. The patient understands and wishes to proceed with the procedure. Written consent was obtained.  Ultrasound demonstrates multiloculated effusion on the right. There are numerous filmy web-like septations, however adequate fluid for thoracentesis.  Ultrasound was performed to localize and mark an adequate pocket of fluid in the right chest. The area was then prepped and draped in the normal sterile fashion. 1% Lidocaine was used for local anesthesia. Under ultrasound guidance a 19 gauge Yueh catheter was introduced. Thoracentesis was performed. The catheter was removed and a dressing applied.  COMPLICATIONS: None immediate.  FINDINGS: A total of approximately 680 mL of clear yellow fluid was removed. A fluid sample wassent for laboratory analysis.  IMPRESSION: Successful ultrasound guided right thoracentesis yielding 680 mL of pleural fluid.  Read by: Ascencion Dike PA-C   Electronically Signed   By: Jerilynn Mages.   Shick M.D.   On: 07/19/2014 10:22    Scheduled Meds: . aspirin EC  81 mg Oral Daily  . atorvastatin  40 mg Oral q1800  . calcitRIOL  0.25 mcg Oral Daily  . carvedilol  3.125 mg Oral BID WC  . docusate sodium  100 mg Oral BID  . furosemide  120 mg Intravenous BID  . heparin  5,000 Units Subcutaneous 3 times per day  . insulin aspart  0-9 Units Subcutaneous TID WC  . [START ON 07/21/2014] levofloxacin  500 mg Oral Q48H  . LORazepam  0.5 mg Oral BID  . mirtazapine  7.5 mg Oral QHS  . polyethylene glycol  17 g Oral Daily  . saccharomyces boulardii  250 mg Oral BID  . sodium chloride  3 mL Intravenous Q12H  . sodium chloride  3 mL Intravenous Q12H  . vitamin B-12  1,000 mcg Oral Daily   Continuous Infusions:   Principal Problem:   Acute respiratory failure with hypoxia Active Problems:   Anemia   Diabetes mellitus, type II   Hypertension   Chronic atrial fibrillation   Chronic diastolic CHF (congestive heart failure)   CKD (chronic kidney disease), stage IV   Moderate aortic stenosis   Type 2 diabetes mellitus with diabetic chronic kidney disease   Acute on chronic diastolic CHF (congestive heart failure)   Choledocholithiasis with acute cholecystitis   Pleural effusion, right   Pleural effusion   Lung mass   DNR (do not resuscitate)    Time spent: 5 min    Lara Palinkas  [facility] Pager [phone]. If 7PM-7AM, please contact night-coverage at [email or web address], password [facility] 07/20/2014, 2:53 PM  LOS: 6 days

## 2014-07-20 NOTE — Progress Notes (Addendum)
Name: [name] MRN: [id number] DOB: 03/20/1926    ADMISSION DATE:  07/14/2014 CONSULTATION DATE:  4/22  REFERRING MD :  Betsey Holiday   CHIEF COMPLAINT:  Right pleural effusion   BRIEF PATIENT DESCRIPTION:  81 year old female w/ sig PMH chronic diastolic HF, AS, CAF (not on anticoagulation). Recently underwent ERCP On 4/19 for retained bile duct stones. Discharged to home w/ cystostomy tube. Since d/c has had 3 d h/o progressive LE edema increased SOB and right sided pleuritic type CP for which she presented to the ER. On CXR was found to have large right pleural effusion for which we have been consulted.    SIGNIFICANT EVENTS  ERCP 4/19 R thoracentesis 4/22>>> 1.5L off - minimal exudate by total protein, 4500 WBC's with 72% neutrophils No malignant cells. Acute inflammation + CT chest 4/22>>> (POST thora) Moderate right-sided pleural effusion with small left-sided pleural effusion. Associated compressive atelectasis. Etiology is uncertain, as there is no evidence of overt pulmonary edema, and there is no evidence of lobar pneumonia. Mixed linear and ground-glass opacities of the bilateral lungs favored to represent atelectasis/ scarring.  07/17/14  Denies SOB.  Feeling better.  Still some DOE.  07/18/14 : Feels fatigued. Wants biliary drain out. CXR with fluid recurrence. C/o dyspnea on exertion 4.27/16:  Repeat thora 680cc -> exudate but more lymphocytic   SUBJECTIVE/OVERNIGHT/INTERVAL HX 07/20/14: repeat thora yesterday - exudate again more lpympoyctic. Biliary drain blocked so concern for retained stone. Possible ERCP 06/20/14. She is feeling better overall. o   VITAL SIGNS: Temp:  [97.7 F (36.5 C)-98.5 F (36.9 C)] 98.4 F (36.9 C) (04/28 0536) Pulse Rate:  [67-82] 70 (04/28 1015) Resp:  [18] 18 (04/27 2219) BP: (115-132)/(44-66) 122/66 mmHg (04/28 1015) SpO2:  [95 %-97 %] 97 % (04/28 0536) Weight:  [75.025 kg (165 lb 6.4 oz)] 75.025 kg (165 lb 6.4 oz) (04/28  0536)  PHYSICAL EXAMINATION: General:  81 year old female, dcnditiond looking, sitting in chair Neuro:  Awake, alert, oriented. No focal def  HEENT:  [place], No JVD  Cardiovascular:  Reg irreg + IV/VI murmur c/w AS Lungs:  Diminished R, few scattered crackles L, resps even non labored on RA   Abdomen:  Soft, non-tender + bowel sounds  Musculoskeletal:  Intact  Skin:  3+ LE edema. Chronic venous changes   PULMONARY No results for input(s): PHART, PCO2ART, PO2ART, HCO3, TCO2, O2SAT in the last 168 hours.  Invalid input(s): PCO2, PO2  CBC  Recent Labs Lab 07/18/14 0538 07/19/14 0442 07/20/14 0356  HGB 8.3* 8.4* 8.8*  HCT 26.4* 26.8* 28.6*  WBC 8.8 8.3 8.6  PLT 184 183 188    COAGULATION No results for input(s): INR in the last 168 hours.  CARDIAC    Recent Labs Lab 07/14/14 1125  TROPONINI 0.03   No results for input(s): PROBNP in the last 168 hours.   CHEMISTRY  Recent Labs Lab 07/16/14 0702 07/17/14 0518 07/18/14 0538 07/19/14 0442 07/20/14 0356  NA 140 140 141 138 138  K 3.6 3.8 4.1 3.9 4.0  CL 106 106 105 101 100  CO2 23 24 22 24 27   GLUCOSE 119* 111* 95 101* 129*  BUN 56* 54* 52* 50* 46*  CREATININE 2.93* 2.85* 2.63* 2.75* 2.84*  CALCIUM 8.3* 8.4 8.4 8.3* 8.3*   Estimated Creatinine Clearance: 14.1 mL/min (by C-G formula based on Cr of 2.84).   LIVER  Recent Labs Lab 07/15/14 0505 07/17/14 0518 07/18/14 6754 07/19/14 4920 07/20/14 1007  AST 10 13 14 14 18   ALT 6 6 8 9 8   ALKPHOS 97 112 110 114 119*  BILITOT 0.7 0.6 0.7 0.5 0.3  PROT 5.4* 5.4* 5.2* 5.5* 5.4*  ALBUMIN 2.1* 2.2* 2.2* 2.3* 2.2*     INFECTIOUS  Recent Labs Lab 07/14/14 1135  LATICACIDVEN 1.24     ENDOCRINE CBG (last 3)   Recent Labs  07/20/14 0556 07/20/14 0649 07/20/14 1119  GLUCAP 121* 114* 160*         IMAGING x48h - personally reviewed highlighted image Dg Chest 1 View  07/19/2014   CLINICAL DATA:  Right pleural effusion status post  thoracentesis. Weakness.  EXAM: CHEST  1 VIEW  COMPARISON:  07/18/2014  FINDINGS: Cardiac silhouette remains mildly enlarged. Thoracic aortic calcification is again seen. There is a small right pleural effusion, decreased in size following interval thoracentesis. There is improved aeration of the right mid and lower lung, although persistent parenchymal opacity remains. Pulmonary vascular congestion is similar to the prior study. No pneumothorax is identified. Degenerative changes are noted at the left shoulder.  IMPRESSION: 1. Decreased size of right pleural effusion following thoracentesis. No pneumothorax 2. Improved right lung aeration.   Electronically Signed   By: Logan Bores   On: 07/19/2014 10:41   Ir Cholangiogram Existing Tube  07/19/2014   CLINICAL DATA:  Percutaneous transhepatic cholecystostomy, status post ERCP stone removal 07/11/2014.  EXAM: CHOLANGIOGRAM VIA EXISTING CATHETER  COMPARISON:  07/11/2014  FINDINGS: Contrast injection performed of the right upper quadrant cholecystostomy. Residual small filling defects in the gallbladder compatible with cholelithiasis. Cystic duct and common hepatic duct are patent. Contrast refluxes into the biliary radicles. The proximal CBD fills with contrast but despite adequate biliary distension contrast does not pass into the duodenum. Findings compatible with distal CBD obstruction possibly related to inflammation or the recent intervention for stone removal. System was decompressed by syringe aspiration.  IMPRESSION: Cholangiogram through existing cholecystostomy demonstrates distal CBD obstruction. Contrast does not pass into the duodenum.  PLAN: Keep cholecystostomy gravity drainage. Consider repeat injection in 1 week. Follow-up with Dr. Deatra Ina.  These results were called by telephone at the time of interpretation on 07/19/2014 at 9:51 am to Dr. Ferdinand Cava, [facility] , who verbally acknowledged these results.   Electronically Signed   By: Jerilynn Mages.  Shick M.D.    On: 07/19/2014 09:52   US Thoracentesis Asp Pleural Space W/img Guide  07/19/2014   CLINICAL DATA:  Right-sided pleural effusion. Request diagnostic and therapeutic thoracentesis.  EXAM: ULTRASOUND GUIDED RIGHT THORACENTESIS  COMPARISON:  None.  PROCEDURE: An ultrasound guided thoracentesis was thoroughly discussed with the patient and questions answered. The benefits, risks, alternatives and complications were also discussed. The patient understands and wishes to proceed with the procedure. Written consent was obtained.  Ultrasound demonstrates multiloculated effusion on the right. There are numerous filmy web-like septations, however adequate fluid for thoracentesis.  Ultrasound was performed to localize and mark an adequate pocket of fluid in the right chest. The area was then prepped and draped in the normal sterile fashion. 1% Lidocaine was used for local anesthesia. Under ultrasound guidance a 19 gauge Yueh catheter was introduced. Thoracentesis was performed. The catheter was removed and a dressing applied.  COMPLICATIONS: None immediate.  FINDINGS: A total of approximately 680 mL of clear yellow fluid was removed. A fluid sample wassent for laboratory analysis.  IMPRESSION: Successful ultrasound guided right thoracentesis yielding 680 mL of pleural fluid.  Read by: Ascencion Dike PA-C   Electronically  Signed   By: Jerilynn Mages.  Shick M.D.   On: 07/19/2014 10:22        ASSESSMENT / PLAN: Biliary obstruction s/p recent ERCP 4/19  - Acute hypoxemic respiratory failure -- in setting of Large right Pleural effusion, HCAP, dCHF and severe AS   Large R pleural effusion - s/p thoracentesis 4/22. - non diagnostic exudate. Acute neutrophils   - has recurrred 07/19/2014 - s.p 680cc thora - exudate lymphocytic . Cytologic pending  This is likely parapneumonic effusion related to Gall bladder issues given initial neutrophils 07/14/14 and now repeat is probably transformaton to chronic. Suspect unlikely to recur but  risk remains.    Plan If recurs place pleur Rx for several weeks - overal prognosis is good Repeat CXR in 2 days Ultimately will need CT chest during opd pulm fu to ensure no underlying mass  PCCM will see again Saturday 07/22/14  D.w Dr Erlinda Hong and patient daughter         Dr. Brand Males, M.D., [facility]C.P Pulmonary and Critical Care Medicine Staff Physician [facility] Pager: [phone], If no answer or between  15:00h - 7:00h: call [phone]  07/20/2014 11:59 AM

## 2014-07-21 ENCOUNTER — Inpatient Hospital Stay (HOSPITAL_COMMUNITY): Payer: Medicare Other | Admitting: Registered Nurse

## 2014-07-21 ENCOUNTER — Inpatient Hospital Stay (HOSPITAL_COMMUNITY): Payer: Medicare Other

## 2014-07-21 ENCOUNTER — Encounter (HOSPITAL_COMMUNITY)
Admission: EM | Disposition: A | Discharge status: Home / Self Care | Payer: Self-pay | Source: Home / Self Care | Attending: Internal Medicine

## 2014-07-21 ENCOUNTER — Encounter (HOSPITAL_COMMUNITY): Payer: Self-pay | Admitting: Gastroenterology

## 2014-07-21 DIAGNOSIS — K831 Obstruction of bile duct: Secondary | ICD-10-CM | POA: Insufficient documentation

## 2014-07-21 HISTORY — PX: ERCP: SHX5425

## 2014-07-21 LAB — COMPREHENSIVE METABOLIC PANEL
ALBUMIN: 2.3 g/dL — AB (ref 3.5–5.2)
ALT: 8 U/L (ref 0–35)
AST: 16 U/L (ref 0–37)
Alkaline Phosphatase: 110 U/L (ref 39–117)
Anion gap: 13 (ref 5–15)
BILIRUBIN TOTAL: 0.4 mg/dL (ref 0.3–1.2)
BUN: 47 mg/dL — AB (ref 6–23)
CALCIUM: 8.5 mg/dL (ref 8.4–10.5)
CHLORIDE: 100 mmol/L (ref 96–112)
CO2: 25 mmol/L (ref 19–32)
Creatinine, Ser: 3.1 mg/dL — ABNORMAL HIGH (ref 0.50–1.10)
GFR calc Af Amer: 15 mL/min — ABNORMAL LOW (ref >90)
GFR, EST NON AFRICAN AMERICAN: 13 mL/min — AB (ref >90)
Glucose, Bld: 95 mg/dL (ref 70–99)
Potassium: 3.7 mmol/L (ref 3.5–5.1)
SODIUM: 138 mmol/L (ref 135–145)
Total Protein: 5.4 g/dL — ABNORMAL LOW (ref 6.0–8.3)

## 2014-07-21 LAB — CBC
HEMATOCRIT: 27.9 % — AB (ref 36.0–46.0)
Hemoglobin: 8.6 g/dL — ABNORMAL LOW (ref 12.0–15.0)
MCH: 28.1 pg (ref 26.0–34.0)
MCHC: 30.8 g/dL (ref 30.0–36.0)
MCV: 91.2 fL (ref 78.0–100.0)
PLATELETS: 186 10*3/uL (ref 150–400)
RBC: 3.06 MIL/uL — AB (ref 3.87–5.11)
RDW: 15.1 % (ref 11.5–15.5)
WBC: 8.4 10*3/uL (ref 4.0–10.5)

## 2014-07-21 LAB — GLUCOSE, CAPILLARY
GLUCOSE-CAPILLARY: 155 mg/dL — AB (ref 70–99)
GLUCOSE-CAPILLARY: 133 mg/dL — AB (ref 70–99)
Glucose-Capillary: 102 mg/dL — ABNORMAL HIGH (ref 70–99)

## 2014-07-21 SURGERY — ERCP, WITH INTERVENTION IF INDICATED
Anesthesia: General

## 2014-07-21 MED ORDER — LIDOCAINE HCL (CARDIAC) 20 MG/ML IV SOLN
INTRAVENOUS | Status: AC
  Filled 2014-07-21: qty 5

## 2014-07-21 MED ORDER — LIDOCAINE HCL (CARDIAC) 20 MG/ML IV SOLN
INTRAVENOUS | Status: DC | PRN
  Administered 2014-07-21: 25 mg via INTRATRACHEAL
  Administered 2014-07-21: 75 mg via INTRAVENOUS

## 2014-07-21 MED ORDER — GLUCAGON HCL RDNA (DIAGNOSTIC) 1 MG IJ SOLR
INTRAMUSCULAR | Status: AC
  Filled 2014-07-21: qty 1

## 2014-07-21 MED ORDER — IOHEXOL 350 MG/ML SOLN
INTRAVENOUS | Status: DC | PRN
  Administered 2014-07-21: 13:00:00

## 2014-07-21 MED ORDER — MEPERIDINE HCL 25 MG/ML IJ SOLN: 6.2500 mg | INTRAMUSCULAR | Status: DC | PRN

## 2014-07-21 MED ORDER — INDOMETHACIN 50 MG RE SUPP
100.0000 mg | Freq: Once | RECTAL | Status: AC
  Administered 2014-07-21: 100 mg via RECTAL
  Filled 2014-07-21: qty 2

## 2014-07-21 MED ORDER — PROPOFOL 10 MG/ML IV BOLUS
INTRAVENOUS | Status: AC
  Filled 2014-07-21: qty 20

## 2014-07-21 MED ORDER — CIPROFLOXACIN IN D5W 400 MG/200ML IV SOLN
INTRAVENOUS | Status: AC
  Filled 2014-07-21: qty 200

## 2014-07-21 MED ORDER — ONDANSETRON HCL 4 MG/2ML IJ SOLN
INTRAMUSCULAR | Status: AC
  Filled 2014-07-21: qty 2

## 2014-07-21 MED ORDER — GLYCOPYRROLATE 0.2 MG/ML IJ SOLN
INTRAMUSCULAR | Status: AC
  Filled 2014-07-21: qty 1

## 2014-07-21 MED ORDER — SUCCINYLCHOLINE CHLORIDE 20 MG/ML IJ SOLN
INTRAMUSCULAR | Status: DC | PRN
  Administered 2014-07-21: 100 mg via INTRAVENOUS

## 2014-07-21 MED ORDER — FENTANYL CITRATE (PF) 100 MCG/2ML IJ SOLN
INTRAMUSCULAR | Status: AC
  Filled 2014-07-21: qty 2

## 2014-07-21 MED ORDER — PROMETHAZINE HCL 25 MG/ML IJ SOLN: 6.2500 mg | INTRAMUSCULAR | Status: DC | PRN

## 2014-07-21 MED ORDER — PROPOFOL 10 MG/ML IV BOLUS
INTRAVENOUS | Status: DC | PRN
  Administered 2014-07-21: 100 mg via INTRAVENOUS

## 2014-07-21 MED ORDER — PHENYLEPHRINE HCL 10 MG/ML IJ SOLN
INTRAMUSCULAR | Status: AC
  Filled 2014-07-21: qty 1

## 2014-07-21 MED ORDER — SODIUM CHLORIDE 0.9 % IV SOLN: INTRAVENOUS | Status: DC

## 2014-07-21 MED ORDER — FENTANYL CITRATE (PF) 100 MCG/2ML IJ SOLN
INTRAMUSCULAR | Status: DC | PRN
  Administered 2014-07-21: 50 ug via INTRAVENOUS

## 2014-07-21 MED ORDER — GLUCAGON HCL RDNA (DIAGNOSTIC) 1 MG IJ SOLR
INTRAMUSCULAR | Status: DC | PRN
  Administered 2014-07-21: 1 mg via INTRAVENOUS

## 2014-07-21 MED ORDER — CIPROFLOXACIN IN D5W 400 MG/200ML IV SOLN: 400.0000 mg | Freq: Once | INTRAVENOUS | Status: DC

## 2014-07-21 MED ORDER — ONDANSETRON HCL 4 MG/2ML IJ SOLN
INTRAMUSCULAR | Status: DC | PRN
  Administered 2014-07-21: 4 mg via INTRAVENOUS

## 2014-07-21 NOTE — H&P (View-Only) (Signed)
Daily Rounding Note  07/20/2014, 8:22 AM  LOS: 6 days   SUBJECTIVE:       Biliary drainage 140 cc yesterday.  Has not required hydrocodone.  Some drain site region discomfort with deep breathing.  No nausea.  Eating 50 to 100% of meals.  Gets to bathroom independently with walker  OBJECTIVE:         Vital signs in last 24 hours:    Temp:  [97.7 F (36.5 C)-98.5 F (36.9 C)] 98.4 F (36.9 C) (04/28 0536) Pulse Rate:  [67-82] 82 (04/28 0536) Resp:  [16-18] 18 (04/27 2219) BP: (114-132)/(44-58) 124/53 mmHg (04/28 0536) SpO2:  [94 %-97 %] 97 % (04/28 0536) Weight:  [165 lb 6.4 oz (75.025 kg)] 165 lb 6.4 oz (75.025 kg) (04/28 0536) Last BM Date: 07/19/14 Filed Weights   07/18/14 0648 07/19/14 0517 07/20/14 0536  Weight: 167 lb 4.8 oz (75.887 kg) 165 lb 11.2 oz (75.161 kg) 165 lb 6.4 oz (75.025 kg)   General: pleasant, comfortable, alert.  Looks frail but not ill looking   Heart: RRR Chest: clear bil but reduced/absent BS on right base.  No dyspnea or cough.  Abdomen: soft, hypoactive BS.  Slight tenderness in region of perc drain at RUQ/flank.  Bandage dry, clean.  No bile in drain bag  Extremities: no CCE Neuro/Psych:  Pleasant, oriented x 3.  No gross deficits.   Intake/Output from previous day: 04/27 0701 - 04/28 0700 In: 1042 [P.O.:918; IV Piggyback:124] Out: 2240 [Urine:2100; Drains:140]  Intake/Output this shift:    Lab Results:  Recent Labs  07/18/14 0538 07/19/14 0442 07/20/14 0356  WBC 8.8 8.3 8.6  HGB 8.3* 8.4* 8.8*  HCT 26.4* 26.8* 28.6*  PLT 184 183 188   BMET  Recent Labs  07/18/14 0538 07/19/14 0442 07/20/14 0356  NA 141 138 138  K 4.1 3.9 4.0  CL 105 101 100  CO2 $Re'22 24 27  'MRa$ GLUCOSE 95 101* 129*  BUN 52* 50* 46*  CREATININE 2.63* 2.75* 2.84*  CALCIUM 8.4 8.3* 8.3*   LFT  Recent Labs  07/18/14 0538 07/19/14 0442 07/20/14 0356  PROT 5.2* 5.5* 5.4*  ALBUMIN 2.2* 2.3* 2.2*    AST $Re'14 14 18  'Khj$ ALT $R'8 9 8  'xr$ ALKPHOS 110 114 119*  BILITOT 0.7 0.5 0.3   PT/INR No results for input(s): LABPROT, INR in the last 72 hours. Hepatitis Panel No results for input(s): HEPBSAG, HCVAB, HEPAIGM, HEPBIGM in the last 72 hours.  Studies/Results: Dg Chest 1 View  07/19/2014   CLINICAL DATA:  Right pleural effusion status post thoracentesis. Weakness.  EXAM: CHEST  1 VIEW  COMPARISON:  07/18/2014  FINDINGS: Cardiac silhouette remains mildly enlarged. Thoracic aortic calcification is again seen. There is a small right pleural effusion, decreased in size following interval thoracentesis. There is improved aeration of the right mid and lower lung, although persistent parenchymal opacity remains. Pulmonary vascular congestion is similar to the prior study. No pneumothorax is identified. Degenerative changes are noted at the left shoulder.  IMPRESSION: 1. Decreased size of right pleural effusion following thoracentesis. No pneumothorax 2. Improved right lung aeration.   Electronically Signed   By: Logan Bores   On: 07/19/2014 10:41   Ir Cholangiogram Existing Tube  07/19/2014   CLINICAL DATA:  Percutaneous transhepatic cholecystostomy, status post ERCP stone removal 07/11/2014.  EXAM: CHOLANGIOGRAM VIA EXISTING CATHETER  COMPARISON:  07/11/2014  FINDINGS: Contrast injection performed of the right upper quadrant  cholecystostomy. Residual small filling defects in the gallbladder compatible with cholelithiasis. Cystic duct and common hepatic duct are patent. Contrast refluxes into the biliary radicles. The proximal CBD fills with contrast but despite adequate biliary distension contrast does not pass into the duodenum. Findings compatible with distal CBD obstruction possibly related to inflammation or the recent intervention for stone removal. System was decompressed by syringe aspiration.  IMPRESSION: Cholangiogram through existing cholecystostomy demonstrates distal CBD obstruction. Contrast does not  pass into the duodenum.  PLAN: Keep cholecystostomy gravity drainage. Consider repeat injection in 1 week. Follow-up with Dr. Deatra Ina.  These results were called by telephone at the time of interpretation on 07/19/2014 at 9:51 am to Dr. Ferdinand Cava, [facility] , who verbally acknowledged these results.   Electronically Signed   By: Jerilynn Mages.  Shick M.D.   On: 07/19/2014 09:52   US Thoracentesis Asp Pleural Space W/img Guide  07/19/2014   CLINICAL DATA:  Right-sided pleural effusion. Request diagnostic and therapeutic thoracentesis.  EXAM: ULTRASOUND GUIDED RIGHT THORACENTESIS  COMPARISON:  None.  PROCEDURE: An ultrasound guided thoracentesis was thoroughly discussed with the patient and questions answered. The benefits, risks, alternatives and complications were also discussed. The patient understands and wishes to proceed with the procedure. Written consent was obtained.  Ultrasound demonstrates multiloculated effusion on the right. There are numerous filmy web-like septations, however adequate fluid for thoracentesis.  Ultrasound was performed to localize and mark an adequate pocket of fluid in the right chest. The area was then prepped and draped in the normal sterile fashion. 1% Lidocaine was used for local anesthesia. Under ultrasound guidance a 19 gauge Yueh catheter was introduced. Thoracentesis was performed. The catheter was removed and a dressing applied.  COMPLICATIONS: None immediate.  FINDINGS: A total of approximately 680 mL of clear yellow fluid was removed. A fluid sample wassent for laboratory analysis.  IMPRESSION: Successful ultrasound guided right thoracentesis yielding 680 mL of pleural fluid.  Read by: Ascencion Dike PA-C   Electronically Signed   By: Jerilynn Mages.  Shick M.D.   On: 07/19/2014 10:22   Scheduled Meds: . aspirin EC  81 mg Oral Daily  . atorvastatin  40 mg Oral q1800  . calcitRIOL  0.25 mcg Oral Daily  . carvedilol  3.125 mg Oral BID WC  . docusate sodium  100 mg Oral BID  . furosemide  120 mg  Intravenous BID  . heparin  5,000 Units Subcutaneous 3 times per day  . insulin aspart  0-9 Units Subcutaneous TID WC  . levofloxacin (LEVAQUIN) IV  500 mg Intravenous Q48H  . LORazepam  0.5 mg Oral BID  . mirtazapine  7.5 mg Oral QHS  . polyethylene glycol  17 g Oral Daily  . saccharomyces boulardii  250 mg Oral BID  . sodium chloride  3 mL Intravenous Q12H  . sodium chloride  3 mL Intravenous Q12H  . vitamin B-12  1,000 mcg Oral Daily   Continuous Infusions:  PRN Meds:.sodium chloride, acetaminophen **OR** acetaminophen, clotrimazole-betamethasone, docusate sodium, HYDROcodone-acetaminophen, meclizine, MUSCLE RUB, ondansetron **OR** ondansetron (ZOFRAN) IV, promethazine, sodium chloride   ASSESMENT:   * Hx acute cholecystitis. Treated with perc biliary drain 05/11/14. Initially thought to be acalculous but developed choledocholithiasis requiring ERCP/Sphinct and stone removal 4/19. Tolerated clamping of tube as of 4/25 but today's cholangiogram raising ? Of recurrent CBD obstruction and stones. alk phos rising slightly. Clinically pt feels better overall but still not feeling well.   * Right pleural effusion. S/p thoracentesis x 2 this admission, latest on  4/27.  On Levaquin for PNA.   * Diastolic CHF. Admissions for CAP and HCAP in 02/2014.   * Chronic A fib, not on AC due to risk of falls.   * Rectal cancer and LAR/ostomy in 1980s. Recurrent cancer with right colectomy 2002.   *  CKD.  Stage 4.    PLAN   *  Per Dr Despina Hick  07/20/2014, 8:22 AM Pager: [phone]

## 2014-07-21 NOTE — Anesthesia Postprocedure Evaluation (Signed)
  Anesthesia Post-op Note  Patient: [name]  Procedure(s) Performed: Procedure(s) (LRB): ENDOSCOPIC RETROGRADE CHOLANGIOPANCREATOGRAPHY (ERCP) (N/A)  Patient Location: PACU  Anesthesia Type: General  Level of Consciousness: awake and alert   Airway and Oxygen Therapy: Patient Spontanous Breathing  Post-op Pain: mild  Post-op Assessment: Post-op Vital signs reviewed, Patient's Cardiovascular Status Stable, Respiratory Function Stable, Patent Airway and No signs of Nausea or vomiting  Last Vitals:  Filed Vitals:   07/21/14 1310  BP: 122/52  Pulse: 63  Temp:   Resp: 19    Post-op Vital Signs: stable   Complications: No apparent anesthesia complications

## 2014-07-21 NOTE — Anesthesia Procedure Notes (Signed)
Procedure Name: Intubation Date/Time: 07/21/2014 11:41 AM Performed by: Lissa Morales Pre-anesthesia Checklist: Patient identified, Emergency Drugs available, Suction available and Patient being monitored Patient Re-evaluated:Patient Re-evaluated prior to inductionOxygen Delivery Method: Circle System Utilized Preoxygenation: Pre-oxygenation with 100% oxygen Intubation Type: IV induction Ventilation: Mask ventilation without difficulty Laryngoscope Size: Mac and 3 Grade View: Grade I Tube type: Oral Tube size: 7.5 mm Number of attempts: 1 Airway Equipment and Method: Stylet and Oral airway Placement Confirmation: ETT inserted through vocal cords under direct vision,  positive ETCO2 and breath sounds checked- equal and bilateral Secured at: 20 cm Tube secured with: Tape (paper) Dental Injury: Teeth and Oropharynx as per pre-operative assessment

## 2014-07-21 NOTE — Anesthesia Preprocedure Evaluation (Signed)
Anesthesia Evaluation  Patient identified by MRN, date of birth, ID band Patient awake    Reviewed: Allergy & Precautions, NPO status , Patient's Chart, lab work & pertinent test results  Airway Mallampati: II  TM Distance: >3 FB Neck ROM: Full    Dental no notable dental hx. (+) Edentulous Upper, Edentulous Lower   Pulmonary neg pulmonary ROS,  breath sounds clear to auscultation  Pulmonary exam normal       Cardiovascular hypertension, Pt. on medications +CHF + dysrhythmias Atrial Fibrillation + Valvular Problems/Murmurs AS Rhythm:Regular Rate:Normal     Neuro/Psych negative neurological ROS  negative psych ROS   GI/Hepatic negative GI ROS, Neg liver ROS,   Endo/Other  diabetes, Type 2, Oral Hypoglycemic Agents  Renal/GU CRFRenal disease  negative genitourinary   Musculoskeletal negative musculoskeletal ROS (+)   Abdominal   Peds negative pediatric ROS (+)  Hematology negative hematology ROS (+)   Anesthesia Other Findings   Reproductive/Obstetrics negative OB ROS                             Anesthesia Physical  Anesthesia Plan  ASA: III  Anesthesia Plan: General   Post-op Pain Management:    Induction: Intravenous  Airway Management Planned: Oral ETT  Additional Equipment:   Intra-op Plan:   Post-operative Plan: Extubation in OR  Informed Consent: I have reviewed the patients History and Physical, chart, labs and discussed the procedure including the risks, benefits and alternatives for the proposed anesthesia with the patient or authorized representative who has indicated his/her understanding and acceptance.   Dental advisory given  Plan Discussed with: CRNA  Anesthesia Plan Comments:         Anesthesia Quick Evaluation

## 2014-07-21 NOTE — Op Note (Signed)
[facility] [place], [id number]   ERCP PROCEDURE REPORT  PATIENT: [name], [name]  MR#: [id number] BIRTHDATE: 09-25-1925  GENDER: female  ENDOSCOPIST: Inda Castle, MD REFERRED BY:  PROCEDURE DATE:  07/21/2014 PROCEDURE:   ERCP, diagnostic INDICATIONS:suspected or rule out bile duct stones.  MEDICATIONS: Per Anesthesia TOPICAL ANESTHETIC:  DESCRIPTION OF PROCEDURE:     Physical exam was performed.  Informed consent was obtained from the patient after explaining the benefits, risks, and alternatives to the procedure.  The patient was connected to the monitor and placed in the semi-prone position. IV medicine was administered through an indwelling cannula and oxygen via endotracheal tube.  After administration of sedation, the patients esophagus was intubated and the     endoscope was advanced under direct visualization to the second portion of the duodenum.  The bile duct was successfully cannulated using the Mer[place] with a 0.25 inch wire.  A non-occlusive cholangiogram was performed.  The right and left intrahepatic ducts were normal.  The cystic duct remnant was visualized and a blush of dye was seen in the right upper quadrant consistent with a bile leak.  The common duct was approximately 95mm.  No filling defects were appreciated.  The sphincterotome was withdrawn and a sphincterotomy was performed using blended current .  No bleeding was noted.  The sphincterotome was then removed and an 8.5 French/7 cm biliary stent was placed with return of good bile.  The pancreatic duct was not cannulated intentionally.  The patient was recovered in the ICU and discharged to their floor bed in satisfactory condition.  Bile duct was selectively cannulated.  There was diffuse dilatation of the common bile duct and cystic duct.  No frank filling defects were seen. The duct was swept with a 15 mm balloon stone extractor.  Very  few minute stone fragments were seen in the duodenum.  There are no obvious filling defects on the cholangiogram nor stones in the duodenum.  this was repeated multiple times.  There was prompt emptying of the bile duct.    The scope was then completely withdrawn from the patient and the procedure terminated.    COMPLICATIONS:    None  ENDOSCOPIC IMPRESSION: No obvious retained bile duct stones.  Excellent emptying of the bile duct.  RECOMMENDATIONS: Continue to observe drainage through the cholecystostomy    _______________________________ eSigned:  Inda Castle, MD 07/21/2014 12:41 PM

## 2014-07-21 NOTE — Progress Notes (Signed)
[facility] PROGRESS NOTE  [name] ZOX:[id number] DOB: 1925-04-24 DOA: 07/14/2014 PCP: Abigail Miyamoto, MD  Brief Summary  [name] is a 81 y.o. female, with a complex past medical history including chronic kidney disease (baseline creatinine 2.3-2.6), chronic diastolic heart failure with pulmonary hypertension (last EF in February 2016 was 65-70%), diabetes mellitus, moderate to severe aortic stenosis, atrial fibrillation (not on Coumadin), colon cancer 2 status post colostomy, and recent acalculous cholecystitis status post perc cholecystostomy tube placement.  She underwent ERCP with stone extraction on 4/19.  She presented to the ER on 4/22 with increasing dyspnea, lower extremity swelling, and low grade fever.  In the emergency department she was mildly hypoxic 88% on room air and CXR demonstrated an enlarged right-sided moderate pleural effusion.  PCCM was consulted and she underwent thoracentesis.  Pleural fluid was exudative by Lights criteria, however, culture was negative.  Suspect she has some transudative component also given her heart failure.  Her repeat CXR demonstrated worsening effusion and pulmonary edema and her diuretics were escalated.  She will undergo repeat thoracentesis today.  Increasing her lasix to 120mg  IV BID today also.  Her cholecystostomy tube was clamped in preparation for removal.    Assessment/Plan  Acute hypoxic respiratory failure secondary to RIGHT exudative pleural effusion likely related to ongoing gallbladder inflammation, but will eventually need Ct chest to r/o underline malignancy. -   Underwent thoracentesis on 4/22 with removal of 1.5L cloudy pleural fluid by pulmonology  -  Exudative by light's criteria by total protein and LDH, Cytology:  No malignant cells, + acute inflammatory and reactive mesothelial cells, Pleural fluid culture NGTD,ANA, RF pending -   F/u CT chest demonstrated moderate right sided pleural effusion with small left  pleural effusion, no overt edema or lobar pneumonia -  CXR 4/23, 4/25  with persistent effusion and atelectasis vs. consolidation  - Started levofloxacin for PNA treatment (also covers the citrobacter found in her abscessed gallbladder) per PCCM recs -  IR consult for thoracentesis on 4/27 with 680cc fluids removal, -  CXR on 4/30 and if fluid reaccumulates, PCCM recommending pleurx  Acute on chronic diastolic CHF, persistent LEE -  Last echo shows EF of 65 - 70 in 04/2014. -  BNP 526.1 (troponin 0.03) -  Weight down  -  No lower extremity edema, monitor cr - received lasix 120 mg IV BID, c/o dizzy standing up on 4/28, lasix dose decreased to home dose.  -4/29, cr continue increase,.  D/c lasix , consider gentle hydration if continue increase.  DM II 05/10/2014 hemoglobin A1c 6.8, CBG at goal but mildly low this AM -  Hold oral medications.  -  D/c Lantus  -  Continue SSI  Chronic A Fib, CHADS-VASc= 5 -  Rate controlled with some 2.5 second pauses -  Not a candidate for anticoagulation due to falls. -  D/c'd telemetry  CAD based on CT scan 4/22 -   Continue aspirin, BB, change to high dose statin  CKD stage IV, baseline creatinine 2.5-3, at baseline -  Trend creatinine with diuresis -  Minimize nephrotoxins and renally dose medications  Acute Cholecystitis in 04/2014 s/p percutaneous cholecystostomy tube placement  -  Underwent outpatient ERCP on 4/19. Lipase is 38. LFTs WNL. -  Discussed case with Dr. Pyrtle/Dr. Deatra Ina -  Perc chole clamped on 4/24 -  IR with cholangiogram , no filling of duodenum, GI consulted , repeat ercp on 4/29 , will follow gi and surgery rec's.  Chronic back pain. -  Continue Hydrocodone at home dose.  Hypertension, BP stable -  Continue carvedilol to 3.125mg  BID  Hx of colon cancer 2002 s/p right hemicolectomy and colostomy, BM firm -  Continue miralax  Normocytic anemia, previously iron deficient -  Iron studies c/w inflammation as ferritin  quite elevated, b12 1627, folate 8.7, TSH 0.935 - hgb stable around 8.2-8.3  Diet:  diabetic Access:  PIV IVF:  off Proph:  heparin  Code Status: DNR Family Communication: patient Disposition Plan:  Remain inpatient  Consultants:  Pulmonology  GI/CCS  IR  Procedures:  Thoracentesis 4/22 with removal of 1.5L cloudy pleural fluid  Thoracentesis 4/27 with 680 cc removal  Cholangiogram on 4/27  ERCP 4/29  Antibiotics:  levaquin from 4/25  HPI/Subjective:  Reported feeling dizzy when standing up, denies sob, no chest pain, sitting in chair, daughter in room.   Objective: Filed Vitals:   07/21/14 1350 07/21/14 1400 07/21/14 1410 07/21/14 1459  BP: 145/52 156/51 143/59 125/49  Pulse: 65 59 57 58  Temp:    97.4 F (36.3 C)  TempSrc:    Oral  Resp: 12 14 12 16   Height:      Weight:      SpO2: 93% 91% 93% 96%    Intake/Output Summary (Last 24 hours) at 07/21/14 1829 Last data filed at 07/21/14 1257  Gross per 24 hour  Intake    820 ml  Output    600 ml  Net    220 ml   Filed Weights   07/19/14 0517 07/20/14 0536 07/21/14 0605  Weight: 75.161 kg (165 lb 11.2 oz) 75.025 kg (165 lb 6.4 oz) 74.027 kg (163 lb 3.2 oz)    Exam:   General:  Average weight female, drowsy from ecrp, but aaox3  HEENT:  NCAT, MMM  Cardiovascular:  RRR, nl S1, S2, 3/6 systolic murmur at the left lateral chest   Respiratory:  Diminished on the right , no obvious rales today, diminished at left base, no wheezes, no increased WOB  Abdomen:   NABS, soft, ND, NT, RUQ drain. Chronic colostomy  MSK:   Normal tone and bulk, no edema  Neuro:  Grossly intact  Data Reviewed: Basic Metabolic Panel:  Recent Labs Lab 07/17/14 0518 07/18/14 0538 07/19/14 0442 07/20/14 0356 07/21/14 0416  NA 140 141 138 138 138  K 3.8 4.1 3.9 4.0 3.7  CL 106 105 101 100 100  CO2 24 22 24 27 25   GLUCOSE 111* 95 101* 129* 95  BUN 54* 52* 50* 46* 47*  CREATININE 2.85* 2.63* 2.75* 2.84* 3.10*   CALCIUM 8.4 8.4 8.3* 8.3* 8.5   Liver Function Tests:  Recent Labs Lab 07/17/14 0518 07/18/14 0538 07/19/14 0442 07/20/14 0356 07/21/14 0416  AST 13 14 14 18 16   ALT 6 8 9 8 8   ALKPHOS 112 110 114 119* 110  BILITOT 0.6 0.7 0.5 0.3 0.4  PROT 5.4* 5.2* 5.5* 5.4* 5.4*  ALBUMIN 2.2* 2.2* 2.3* 2.2* 2.3*   No results for input(s): LIPASE, AMYLASE in the last 168 hours. No results for input(s): AMMONIA in the last 168 hours. CBC:  Recent Labs Lab 07/17/14 0518 07/18/14 0538 07/19/14 0442 07/20/14 0356 07/21/14 0416  WBC 8.6 8.8 8.3 8.6 8.4  HGB 8.3* 8.3* 8.4* 8.8* 8.6*  HCT 26.4* 26.4* 26.8* 28.6* 27.9*  MCV 91.3 92.0 90.8 91.4 91.2  PLT 185 184 183 188 186   Cardiac Enzymes: No results for input(s): CKTOTAL, CKMB, CKMBINDEX, TROPONINI  in the last 168 hours. BNP (last 3 results)  Recent Labs  03/15/14 0914 07/14/14 1125  BNP 941.4* 526.1*    ProBNP (last 3 results) No results for input(s): PROBNP in the last 8760 hours.  CBG:  Recent Labs Lab 07/20/14 1119 07/20/14 1621 07/20/14 2058 07/21/14 0554 07/21/14 1634  GLUCAP 160* 150* 154* 102* 155*    Recent Results (from the past 240 hour(s))  Urine culture     Status: None   Collection Time: 07/14/14  1:14 PM  Result Value Ref Range Status   Specimen Description URINE, RANDOM  Final   Special Requests NONE  Final   Colony Count NO GROWTH Performed at [facility]   Final   Culture NO GROWTH Performed at [facility]   Final   Report Status 07/16/2014 FINAL  Final  Body fluid culture     Status: None   Collection Time: 07/14/14  4:04 PM  Result Value Ref Range Status   Specimen Description FLUID RIGHT PLEURAL  Final   Special Requests Normal  Final   Gram Stain   Final    RARE WBC PRESENT, PREDOMINANTLY PMN NO ORGANISMS SEEN Performed at [facility]    Culture   Final    NO GROWTH 3 DAYS Performed at [facility]    Report Status 07/18/2014 FINAL  Final   Body fluid culture     Status: None (Preliminary result)   Collection Time: 07/19/14  9:59 AM  Result Value Ref Range Status   Specimen Description FLUID PLEURAL RIGHT  Final   Special Requests NONE  Final   Gram Stain   Final    NO WBC SEEN NO ORGANISMS SEEN Performed at [facility]    Culture   Final    NO GROWTH 1 DAY Performed at [facility]    Report Status PENDING  Incomplete     Studies: Dg Ercp  07/21/2014   CLINICAL DATA:  Bile duct obstruction  EXAM: ERCP  TECHNIQUE: Multiple spot images obtained with the fluoroscopic device and submitted for interpretation post-procedure.  FLUOROSCOPY TIME:  5 minutes, 12 seconds  COMPARISON:  Percutaneous cholangiogram very a existing cholecystostomy tube - 07/19/2014; ERCP - 07/11/2014  FINDINGS: 23 spot intraoperative fluoroscopic images of the right upper abdominal quadrant during ERCP are provided for review.  Initial image demonstrates a cholecystostomy tube overlying the expected location of the gallbladder fossa. An ERCP probe overlies the right upper abdominal quadrant with selective cannulation opacification of the distal aspect of the CBD which appears moderately dilated.  Subsequent images demonstrate insufflation of a balloon within the caudal aspect of the CBD with presumably several trials of biliary sweeping and sphincterotomy.  There is minimal opacification of the cystic duct and neck of the gallbladder.  Presumed delayed images demonstrate stasis of contrast within the CBD.  IMPRESSION: ERCP with biliary sweeping and presumed sphincterotomy as above.  These images were submitted for radiologic interpretation only. Please see the procedural report for the amount of contrast and the fluoroscopy time utilized.   Electronically Signed   By: Sandi Mariscal M.D.   On: 07/21/2014 13:46    Scheduled Meds: . aspirin EC  81 mg Oral Daily  . atorvastatin  40 mg Oral q1800  . calcitRIOL  0.25 mcg Oral Daily  . carvedilol   3.125 mg Oral BID WC  . docusate sodium  100 mg Oral BID  . furosemide  80 mg Oral q morning -  10a  . insulin aspart  0-9 Units Subcutaneous TID WC  . levofloxacin  500 mg Oral Q48H  . LORazepam  0.5 mg Oral BID  . mirtazapine  7.5 mg Oral QHS  . polyethylene glycol  17 g Oral Daily  . saccharomyces boulardii  250 mg Oral BID  . sodium chloride  3 mL Intravenous Q12H  . sodium chloride  3 mL Intravenous Q12H  . vitamin B-12  1,000 mcg Oral Daily   Continuous Infusions:   Principal Problem:   Acute respiratory failure with hypoxia Active Problems:   Anemia   Diabetes mellitus, type II   Hypertension   Chronic atrial fibrillation   Chronic diastolic CHF (congestive heart failure)   CKD (chronic kidney disease), stage IV   Moderate aortic stenosis   Type 2 diabetes mellitus with diabetic chronic kidney disease   Acute on chronic diastolic CHF (congestive heart failure)   Choledocholithiasis with acute cholecystitis   Pleural effusion, right   Pleural effusion   Lung mass   DNR (do not resuscitate)   Pleural effusion on right   Bile duct obstruction    Time spent: 25 min    [name]  [facility] Pager [phone]. If 7PM-7AM, please contact night-coverage at [email or web address], password [facility] 07/21/2014, 6:29 PM  LOS: 7 days

## 2014-07-21 NOTE — Progress Notes (Addendum)
ERCP did not demonstrate any obvious large bile duct stones.  The duct was swept multiple times and there was prompt emptying of contrast dye into the duodenum.  At best, there were minute stone fragments measuring perhaps 1-2 mm.  Recommend following cholecystostomy drainage.  In a.m. let's clamp the cholecystostomy tube again.

## 2014-07-21 NOTE — Transfer of Care (Signed)
Immediate Anesthesia Transfer of Care Note  Patient: [name]  Procedure(s) Performed: Procedure(s): ENDOSCOPIC RETROGRADE CHOLANGIOPANCREATOGRAPHY (ERCP) (N/A)  Patient Location: PACU  Anesthesia Type:General  Level of Consciousness: awake, alert , oriented and patient cooperative  Airway & Oxygen Therapy: Patient Spontanous Breathing and Patient connected to face mask oxygen  Post-op Assessment: Report given to RN, Post -op Vital signs reviewed and stable and Patient moving all extremities X 4  Post vital signs: stable  Last Vitals:  Filed Vitals:   07/21/14 1255  BP: 135/41  Pulse: 60  Temp:   Resp: 16    Complications: No apparent anesthesia complications

## 2014-07-21 NOTE — Interval H&P Note (Signed)
History and Physical Interval Note:  07/21/2014 11:12 AM  [name]  has presented today for surgery, with the diagnosis of obstructed CBD  The various methods of treatment have been discussed with the patient and family. After consideration of risks, benefits and other options for treatment, the patient has consented to  Procedure(s): ENDOSCOPIC RETROGRADE CHOLANGIOPANCREATOGRAPHY (ERCP) (N/A) as a surgical intervention .  The patient's history has been reviewed, patient examined, no change in status, stable for surgery.  I have reviewed the patient's chart and labs.  Questions were answered to the patient's satisfac  The recent H&P (dated *07/20/14**) was reviewed, the patient was examined and there is no change in the patients condition since that H&P was completed.   [name]  07/21/2014, 11:12 AM    [name]

## 2014-07-22 ENCOUNTER — Inpatient Hospital Stay (HOSPITAL_COMMUNITY): Payer: Medicare Other

## 2014-07-22 DIAGNOSIS — R748 Abnormal levels of other serum enzymes: Secondary | ICD-10-CM

## 2014-07-22 DIAGNOSIS — R7989 Other specified abnormal findings of blood chemistry: Secondary | ICD-10-CM | POA: Insufficient documentation

## 2014-07-22 LAB — COMPREHENSIVE METABOLIC PANEL
ALK PHOS: 111 U/L (ref 39–117)
ALT: 10 U/L (ref 0–35)
AST: 31 U/L (ref 0–37)
Albumin: 2.2 g/dL — ABNORMAL LOW (ref 3.5–5.2)
Anion gap: 12 (ref 5–15)
BUN: 50 mg/dL — ABNORMAL HIGH (ref 6–23)
CALCIUM: 8.3 mg/dL — AB (ref 8.4–10.5)
CHLORIDE: 101 mmol/L (ref 96–112)
CO2: 25 mmol/L (ref 19–32)
Creatinine, Ser: 3.23 mg/dL — ABNORMAL HIGH (ref 0.50–1.10)
GFR calc Af Amer: 14 mL/min — ABNORMAL LOW (ref >90)
GFR calc non Af Amer: 12 mL/min — ABNORMAL LOW (ref >90)
Glucose, Bld: 76 mg/dL (ref 70–99)
POTASSIUM: 4.9 mmol/L (ref 3.5–5.1)
Sodium: 138 mmol/L (ref 135–145)
TOTAL PROTEIN: 5.4 g/dL — AB (ref 6.0–8.3)
Total Bilirubin: 1 mg/dL (ref 0.3–1.2)

## 2014-07-22 LAB — CBC
HEMATOCRIT: 28.3 % — AB (ref 36.0–46.0)
Hemoglobin: 8.7 g/dL — ABNORMAL LOW (ref 12.0–15.0)
MCH: 28.3 pg (ref 26.0–34.0)
MCHC: 30.7 g/dL (ref 30.0–36.0)
MCV: 92.2 fL (ref 78.0–100.0)
Platelets: 208 10*3/uL (ref 150–400)
RBC: 3.07 MIL/uL — AB (ref 3.87–5.11)
RDW: 15 % (ref 11.5–15.5)
WBC: 9.5 10*3/uL (ref 4.0–10.5)

## 2014-07-22 LAB — URINALYSIS, ROUTINE W REFLEX MICROSCOPIC
Bilirubin Urine: NEGATIVE
Glucose, UA: NEGATIVE mg/dL
Hgb urine dipstick: NEGATIVE
KETONES UR: NEGATIVE mg/dL
Leukocytes, UA: NEGATIVE
NITRITE: NEGATIVE
Protein, ur: NEGATIVE mg/dL
Specific Gravity, Urine: 1.011 (ref 1.005–1.030)
UROBILINOGEN UA: 0.2 mg/dL (ref 0.0–1.0)
pH: 5 (ref 5.0–8.0)

## 2014-07-22 LAB — GLUCOSE, CAPILLARY
GLUCOSE-CAPILLARY: 89 mg/dL (ref 70–99)
Glucose-Capillary: 226 mg/dL — ABNORMAL HIGH (ref 70–99)
Glucose-Capillary: 166 mg/dL — ABNORMAL HIGH (ref 70–99)
Glucose-Capillary: 170 mg/dL — ABNORMAL HIGH (ref 70–99)

## 2014-07-22 MED ORDER — CARVEDILOL 3.125 MG PO TABS
3.1250 mg | ORAL_TABLET | Freq: Two times a day (BID) | ORAL | Status: DC
  Administered 2014-07-22 – 2014-07-24 (×6): 3.125 mg via ORAL
  Filled 2014-07-22 (×8): qty 1

## 2014-07-22 NOTE — Progress Notes (Signed)
    Progress Note   Subjective  No abdominal pain, feels okay.    Objective   Vital signs in last 24 hours: Temp:  [97.4 F (36.3 C)-98.7 F (37.1 C)] 98.7 F (37.1 C) (04/29 2132) Pulse Rate:  [51-83] 74 (04/30 1017) Resp:  [12-19] 16 (04/29 2132) BP: (102-158)/(41-77) 102/64 mmHg (04/29 2132) SpO2:  [90 %-100 %] 97 % (04/29 2132) Last BM Date: 07/21/14 General:    white female in NAD. Family visiting Abdomen:  Soft, nontender and nondistended. Normal bowel sounds. Neurologic:  Alert and oriented,  grossly normal neurologically. Psych:  Cooperative. Normal mood and affect.  Intake/Output from previous day: 04/29 0701 - 04/30 0700 In: 1180 [P.O.:480; I.V.:700] Out: 875 [Urine:750; Drains:125] Intake/Output this shift: Total I/O In: 240 [P.O.:240] Out: -   Lab Results:  Recent Labs  07/20/14 0356 07/21/14 0416 07/22/14 0353  WBC 8.6 8.4 9.5  HGB 8.8* 8.6* 8.7*  HCT 28.6* 27.9* 28.3*  PLT 188 186 208   BMET  Recent Labs  07/20/14 0356 07/21/14 0416 07/22/14 0353  NA 138 138 138  K 4.0 3.7 4.9  CL 100 100 101  CO2 27 25 25   GLUCOSE 129* 95 76  BUN 46* 47* 50*  CREATININE 2.84* 3.10* 3.23*  CALCIUM 8.3* 8.5 8.3*   LFT  Recent Labs  07/22/14 0353  PROT 5.4*  ALBUMIN 2.2*  AST 31  ALT 10  ALKPHOS 111  BILITOT 1.0   Studies/Results: Dg Ercp  07/21/2014   CLINICAL DATA:  Bile duct obstruction  EXAM: ERCP  TECHNIQUE: Multiple spot images obtained with the fluoroscopic device and submitted for interpretation post-procedure.  FLUOROSCOPY TIME:  5 minutes, 12 seconds  COMPARISON:  Percutaneous cholangiogram very a existing cholecystostomy tube - 07/19/2014; ERCP - 07/11/2014  FINDINGS: 23 spot intraoperative fluoroscopic images of the right upper abdominal quadrant during ERCP are provided for review.  Initial image demonstrates a cholecystostomy tube overlying the expected location of the gallbladder fossa. An ERCP probe overlies the right upper  abdominal quadrant with selective cannulation opacification of the distal aspect of the CBD which appears moderately dilated.  Subsequent images demonstrate insufflation of a balloon within the caudal aspect of the CBD with presumably several trials of biliary sweeping and sphincterotomy.  There is minimal opacification of the cystic duct and neck of the gallbladder.  Presumed delayed images demonstrate stasis of contrast within the CBD.  IMPRESSION: ERCP with biliary sweeping and presumed sphincterotomy as above.  These images were submitted for radiologic interpretation only. Please see the procedural report for the amount of contrast and the fluoroscopy time utilized.   Electronically Signed   By: Sandi Mariscal M.D.   On: 07/21/2014 13:46     Assessment / Plan:    110. 81 year old female with acute cholecystitis, s/p perc drain 05/11/14 followed by ERCP with stone removal and sphincterotomy 4/19. She had persistent amounts of drainage through cholecystostomy tube. Cholangiogram through tube 4/25 suggested CBD obstruction. Patient had repeat ERCP yesterday but no obvious retained stones were found. Total drain output yesterday was 125cc. She has about 50cc in bag right now. Will try and clamp tube. Check am labs.   2. Multiple medical problems not limited to CHG, afib, DM, CKD.   LOS: 8 days   [name]  07/22/2014, 11:04 AM

## 2014-07-22 NOTE — Progress Notes (Signed)
[facility] PROGRESS NOTE  [name] VZD:[id number] DOB: Aug 29, 1925 DOA: 07/14/2014 PCP: Abigail Miyamoto, MD  Brief Summary  [name] is a 81 y.o. female, with a complex past medical history including chronic kidney disease (baseline creatinine 2.3-2.6), chronic diastolic heart failure with pulmonary hypertension (last EF in February 2016 was 65-70%), diabetes mellitus, moderate to severe aortic stenosis, atrial fibrillation (not on Coumadin), colon cancer 2 status post colostomy, and recent acalculous cholecystitis status post perc cholecystostomy tube placement.  She underwent ERCP with stone extraction on 4/19.  She presented to the ER on 4/22 with increasing dyspnea, lower extremity swelling, and low grade fever.  In the emergency department she was mildly hypoxic 88% on room air and CXR demonstrated an enlarged right-sided moderate pleural effusion.  PCCM was consulted and she underwent thoracentesis.  Pleural fluid was exudative by Lights criteria, however, culture was negative.  Suspect she has some transudative component also given her heart failure.  Her repeat CXR demonstrated worsening effusion and pulmonary edema and her diuretics were escalated.  She will undergo repeat thoracentesis today.  Increasing her lasix to 120mg  IV BID today also.  Her cholecystostomy tube was clamped in preparation for removal.    Assessment/Plan  Acute hypoxic respiratory failure secondary to RIGHT exudative pleural effusion likely related to ongoing gallbladder inflammation, but will eventually need Ct chest to r/o underline malignancy. -   Underwent thoracentesis on 4/22 with removal of 1.5L cloudy pleural fluid by pulmonology  -  Exudative by light's criteria by total protein and LDH, Cytology:  No malignant cells, + acute inflammatory and reactive mesothelial cells, Pleural fluid culture NGTD,ANA, RF pending -   F/u CT chest demonstrated moderate right sided pleural effusion with small left  pleural effusion, no overt edema or lobar pneumonia -  CXR 4/23, 4/25  with persistent effusion and atelectasis vs. consolidation  - Started levofloxacin for PNA treatment (also covers the citrobacter found in her abscessed gallbladder) per PCCM recs -  IR consult for thoracentesis on 4/27 with 680cc fluids removal, -  CXR on 4/30 showed fluid reaccumulates, PCCM to decide pleurx  Acute on chronic diastolic CHF, persistent LEE -  Last echo shows EF of 65 - 70 in 04/2014. -  BNP 526.1 (troponin 0.03) -  Weight down  -  No lower extremity edema, monitor cr - received lasix 120 mg IV BID, c/o dizzy standing up on 4/28, lasix dose decreased to home dose.  -4/29, cr continue increase,.  D/c lasix , -4/30, cr slight worsening, ua concentrated, no infection, continue hold lasix. Orthostatic vital sign wnl, will try to avoid fluids.  DM II 05/10/2014 hemoglobin A1c 6.8, CBG at goal but mildly low this AM -  Hold oral medications.  -  D/c Lantus  -  Continue SSI  Chronic A Fib, CHADS-VASc= 5 -  Rate controlled with some 2.5 second pauses -  Not a candidate for anticoagulation due to falls. -  D/c'd telemetry  CAD based on CT scan 4/22 -   Continue aspirin, BB, change to high dose statin  CKD stage IV, baseline creatinine 2.5-3, at baseline -  Trend creatinine with diuresis -  Minimize nephrotoxins and renally dose medications  Acute Cholecystitis in 04/2014 s/p percutaneous cholecystostomy tube placement  -  Underwent outpatient ERCP on 4/19. Lipase is 38. LFTs WNL. -  Discussed case with Dr. Pyrtle/Dr. Deatra Ina -  Perc chole clamped on 4/24 -  IR with cholangiogram , no filling of duodenum, GI  consulted , repeat ercp on 4/29 , will follow gi and surgery rec's.   Chronic back pain. -  Continue Hydrocodone at home dose.  Hypertension, BP stable -  Continue carvedilol to 3.125mg  BID  Hx of colon cancer 2002 s/p right hemicolectomy and colostomy, BM firm -  Continue  miralax  Normocytic anemia, previously iron deficient -  Iron studies c/w inflammation as ferritin quite elevated, b12 1627, folate 8.7, TSH 0.935 - hgb stable around 8.2-8.3  Diet:  diabetic Access:  PIV IVF:  off Proph:  heparin  Code Status: DNR Family Communication: patient Disposition Plan:  Remain inpatient  Consultants:  Pulmonology  GI/CCS  IR  Procedures:  Thoracentesis 4/22 with removal of 1.5L cloudy pleural fluid  Thoracentesis 4/27 with 680 cc removal  Cholangiogram on 4/27  ERCP 4/29  Antibiotics:  levaquin from 4/25  HPI/Subjective:  Sitting in chair, Report feeling better, no  dizzy when standing up, denies sob, no chest pain, family  in room.  Cr worsened.  Objective: Filed Vitals:   07/22/14 1011 07/22/14 1012 07/22/14 1014 07/22/14 1017  BP:      Pulse: 63 83 66 74  Temp:      TempSrc:      Resp:      Height:      Weight:      SpO2:        Intake/Output Summary (Last 24 hours) at 07/22/14 1158 Last data filed at 07/22/14 0857  Gross per 24 hour  Intake   1420 ml  Output    875 ml  Net    545 ml   Filed Weights   07/19/14 0517 07/20/14 0536 07/21/14 0605  Weight: 75.161 kg (165 lb 11.2 oz) 75.025 kg (165 lb 6.4 oz) 74.027 kg (163 lb 3.2 oz)    Exam:   General:  Average weight female, drowsy from ecrp, but aaox3  HEENT:  NCAT, MMM  Cardiovascular:  RRR, nl S1, S2, 3/6 systolic murmur at the left lateral chest   Respiratory:  Diminished on the right , no obvious rales today, diminished at left base, no wheezes, no increased WOB  Abdomen:   NABS, soft, ND, NT, RUQ drain. Chronic colostomy  MSK:   Normal tone and bulk, no edema  Neuro:  Grossly intact  Data Reviewed: Basic Metabolic Panel:  Recent Labs Lab 07/18/14 0538 07/19/14 0442 07/20/14 0356 07/21/14 0416 07/22/14 0353  NA 141 138 138 138 138  K 4.1 3.9 4.0 3.7 4.9  CL 105 101 100 100 101  CO2 22 24 27 25 25   GLUCOSE 95 101* 129* 95 76  BUN 52* 50*  46* 47* 50*  CREATININE 2.63* 2.75* 2.84* 3.10* 3.23*  CALCIUM 8.4 8.3* 8.3* 8.5 8.3*   Liver Function Tests:  Recent Labs Lab 07/18/14 0538 07/19/14 0442 07/20/14 0356 07/21/14 0416 07/22/14 0353  AST 14 14 18 16 31   ALT 8 9 8 8 10   ALKPHOS 110 114 119* 110 111  BILITOT 0.7 0.5 0.3 0.4 1.0  PROT 5.2* 5.5* 5.4* 5.4* 5.4*  ALBUMIN 2.2* 2.3* 2.2* 2.3* 2.2*   No results for input(s): LIPASE, AMYLASE in the last 168 hours. No results for input(s): AMMONIA in the last 168 hours. CBC:  Recent Labs Lab 07/18/14 0538 07/19/14 0442 07/20/14 0356 07/21/14 0416 07/22/14 0353  WBC 8.8 8.3 8.6 8.4 9.5  HGB 8.3* 8.4* 8.8* 8.6* 8.7*  HCT 26.4* 26.8* 28.6* 27.9* 28.3*  MCV 92.0 90.8 91.4 91.2 92.2  PLT  184 183 188 186 208   Cardiac Enzymes: No results for input(s): CKTOTAL, CKMB, CKMBINDEX, TROPONINI in the last 168 hours. BNP (last 3 results)  Recent Labs  03/15/14 0914 07/14/14 1125  BNP 941.4* 526.1*    ProBNP (last 3 results) No results for input(s): PROBNP in the last 8760 hours.  CBG:  Recent Labs Lab 07/21/14 0554 07/21/14 1634 07/21/14 2126 07/22/14 0610 07/22/14 1139  GLUCAP 102* 155* 133* 89 226*    Recent Results (from the past 240 hour(s))  Urine culture     Status: None   Collection Time: 07/14/14  1:14 PM  Result Value Ref Range Status   Specimen Description URINE, RANDOM  Final   Special Requests NONE  Final   Colony Count NO GROWTH Performed at [facility]   Final   Culture NO GROWTH Performed at [facility]   Final   Report Status 07/16/2014 FINAL  Final  Body fluid culture     Status: None   Collection Time: 07/14/14  4:04 PM  Result Value Ref Range Status   Specimen Description FLUID RIGHT PLEURAL  Final   Special Requests Normal  Final   Gram Stain   Final    RARE WBC PRESENT, PREDOMINANTLY PMN NO ORGANISMS SEEN Performed at [facility]    Culture   Final    NO GROWTH 3 DAYS Performed at [facility]    Report Status 07/18/2014 FINAL  Final  Body fluid culture     Status: None (Preliminary result)   Collection Time: 07/19/14  9:59 AM  Result Value Ref Range Status   Specimen Description FLUID PLEURAL RIGHT  Final   Special Requests NONE  Final   Gram Stain   Final    NO WBC SEEN NO ORGANISMS SEEN Performed at [facility]    Culture   Final    NO GROWTH 2 DAYS Performed at [facility]    Report Status PENDING  Incomplete     Studies: Dg Chest Port 1 View  07/22/2014   CLINICAL DATA:  Right-sided pleural effusion. History of moderate aortic stenosis and atrial fibrillation  EXAM: PORTABLE CHEST - 1 VIEW  COMPARISON:  07/19/2014; 07/18/2014; 07/14/2014; chest CT - 07/14/2014  FINDINGS: Grossly unchanged cardiac silhouette and mediastinal contours with atherosclerotic plaque within the thoracic aorta. Interval increase in small right-sided pleural effusion with associated worsening right mid and lower lung heterogeneous / consolidative opacities. Unchanged trace left-sided effusion with associated left basilar heterogeneous opacities. No pneumothorax. Unchanged bones.  IMPRESSION: 1. Interval increase in small right-sided effusion with associated worsening right mid and lower lung opacities, atelectasis versus infiltrate. 2. Unchanged trace left-sided effusion with associated left basilar/retrocardiac opacities   Electronically Signed   By: Sandi Mariscal M.D.   On: 07/22/2014 08:05   Dg Ercp  07/21/2014   CLINICAL DATA:  Bile duct obstruction  EXAM: ERCP  TECHNIQUE: Multiple spot images obtained with the fluoroscopic device and submitted for interpretation post-procedure.  FLUOROSCOPY TIME:  5 minutes, 12 seconds  COMPARISON:  Percutaneous cholangiogram very a existing cholecystostomy tube - 07/19/2014; ERCP - 07/11/2014  FINDINGS: 23 spot intraoperative fluoroscopic images of the right upper abdominal quadrant during ERCP are provided for review.  Initial image  demonstrates a cholecystostomy tube overlying the expected location of the gallbladder fossa. An ERCP probe overlies the right upper abdominal quadrant with selective cannulation opacification of the distal aspect of the CBD which appears moderately dilated.  Subsequent  images demonstrate insufflation of a balloon within the caudal aspect of the CBD with presumably several trials of biliary sweeping and sphincterotomy.  There is minimal opacification of the cystic duct and neck of the gallbladder.  Presumed delayed images demonstrate stasis of contrast within the CBD.  IMPRESSION: ERCP with biliary sweeping and presumed sphincterotomy as above.  These images were submitted for radiologic interpretation only. Please see the procedural report for the amount of contrast and the fluoroscopy time utilized.   Electronically Signed   By: Sandi Mariscal M.D.   On: 07/21/2014 13:46    Scheduled Meds: . aspirin EC  81 mg Oral Daily  . atorvastatin  40 mg Oral q1800  . calcitRIOL  0.25 mcg Oral Daily  . carvedilol  3.125 mg Oral BID WC  . docusate sodium  100 mg Oral BID  . insulin aspart  0-9 Units Subcutaneous TID WC  . levofloxacin  500 mg Oral Q48H  . LORazepam  0.5 mg Oral BID  . mirtazapine  7.5 mg Oral QHS  . polyethylene glycol  17 g Oral Daily  . saccharomyces boulardii  250 mg Oral BID  . sodium chloride  3 mL Intravenous Q12H  . sodium chloride  3 mL Intravenous Q12H  . vitamin B-12  1,000 mcg Oral Daily   Continuous Infusions:   Principal Problem:   Acute respiratory failure with hypoxia Active Problems:   Anemia   Diabetes mellitus, type II   Hypertension   Chronic atrial fibrillation   Chronic diastolic CHF (congestive heart failure)   CKD (chronic kidney disease), stage IV   Moderate aortic stenosis   Type 2 diabetes mellitus with diabetic chronic kidney disease   Acute on chronic diastolic CHF (congestive heart failure)   Choledocholithiasis with acute cholecystitis   Pleural  effusion, right   Pleural effusion   Lung mass   DNR (do not resuscitate)   Pleural effusion on right   Bile duct obstruction    Time spent: 25 min    Lunette Tapp  [facility] Pager [phone]. If 7PM-7AM, please contact night-coverage at [email or web address], password [facility] 07/22/2014, 11:58 AM  LOS: 8 days

## 2014-07-23 ENCOUNTER — Inpatient Hospital Stay (HOSPITAL_COMMUNITY): Payer: Medicare Other

## 2014-07-23 LAB — COMPREHENSIVE METABOLIC PANEL
ALBUMIN: 2.2 g/dL — AB (ref 3.5–5.0)
ALT: 10 U/L — ABNORMAL LOW (ref 14–54)
AST: 19 U/L (ref 15–41)
Alkaline Phosphatase: 116 U/L (ref 38–126)
Anion gap: 11 (ref 5–15)
BILIRUBIN TOTAL: 0.6 mg/dL (ref 0.3–1.2)
BUN: 46 mg/dL — ABNORMAL HIGH (ref 6–20)
CO2: 24 mmol/L (ref 22–32)
Calcium: 8.1 mg/dL — ABNORMAL LOW (ref 8.9–10.3)
Chloride: 101 mmol/L (ref 101–111)
Creatinine, Ser: 3.46 mg/dL — ABNORMAL HIGH (ref 0.44–1.00)
GFR calc non Af Amer: 11 mL/min — ABNORMAL LOW (ref >60)
GFR, EST AFRICAN AMERICAN: 13 mL/min — AB (ref >60)
GLUCOSE: 118 mg/dL — AB (ref 70–99)
POTASSIUM: 4.2 mmol/L (ref 3.5–5.1)
Sodium: 136 mmol/L (ref 135–145)
Total Protein: 5.4 g/dL — ABNORMAL LOW (ref 6.5–8.1)

## 2014-07-23 LAB — GLUCOSE, CAPILLARY
GLUCOSE-CAPILLARY: 121 mg/dL — AB (ref 70–99)
Glucose-Capillary: 179 mg/dL — ABNORMAL HIGH (ref 70–99)
Glucose-Capillary: 176 mg/dL — ABNORMAL HIGH (ref 70–99)
Glucose-Capillary: 132 mg/dL — ABNORMAL HIGH (ref 70–99)

## 2014-07-23 LAB — BODY FLUID CULTURE
Culture: NO GROWTH
Gram Stain: NONE SEEN

## 2014-07-23 LAB — CBC
HCT: 28.1 % — ABNORMAL LOW (ref 36.0–46.0)
Hemoglobin: 8.6 g/dL — ABNORMAL LOW (ref 12.0–15.0)
MCH: 28.2 pg (ref 26.0–34.0)
MCHC: 30.6 g/dL (ref 30.0–36.0)
MCV: 92.1 fL (ref 78.0–100.0)
Platelets: 158 10*3/uL (ref 150–400)
RBC: 3.05 MIL/uL — ABNORMAL LOW (ref 3.87–5.11)
RDW: 15.4 % (ref 11.5–15.5)
WBC: 7.9 10*3/uL (ref 4.0–10.5)

## 2014-07-23 MED ORDER — FERROUS SULFATE 325 (65 FE) MG PO TABS
325.0000 mg | ORAL_TABLET | Freq: Two times a day (BID) | ORAL | Status: DC
  Administered 2014-07-23 – 2014-07-24 (×3): 325 mg via ORAL
  Filled 2014-07-23 (×4): qty 1

## 2014-07-23 NOTE — Progress Notes (Addendum)
Progress Note   Subjective  *Looks and feels well**   Objective  Vital signs in last 24 hours: Temp:  [98.4 F (36.9 C)-98.8 F (37.1 C)] 98.8 F (37.1 C) (05/01 0525) Pulse Rate:  [73-84] 81 (05/01 0745) Resp:  [18-20] 18 (05/01 0525) BP: (112-125)/(43-55) 125/55 mmHg (05/01 0745) SpO2:  [95 %-100 %] 95 % (05/01 0525) Weight:  [164 lb 6.4 oz (74.571 kg)] 164 lb 6.4 oz (74.571 kg) (05/01 0550) Last BM Date: 07/23/14  General: Alert, well-developed,  in NAD Heart:  Regular rate and rhythm; no murmurs Chest: Clear to ascultation bilaterally Abdomen:  Soft, nontender and nondistended. Normal bowel sounds, without guarding, and without rebound.   Extremities:  Without edema. Neurologic:  Alert and  oriented x4; grossly normal neurologically. Psych:  Alert and cooperative. Normal mood and affect.  Intake/Output from previous day: 04/30 0701 - 05/01 0700 In: 840 [P.O.:840] Out: 450 [Urine:400; Drains:50] Intake/Output this shift: Total I/O In: 240 [P.O.:240] Out: -   Lab Results:  Recent Labs  07/21/14 0416 07/22/14 0353 07/23/14 0620  WBC 8.4 9.5 7.9  HGB 8.6* 8.7* 8.6*  HCT 27.9* 28.3* 28.1*  PLT 186 208 158   BMET  Recent Labs  07/21/14 0416 07/22/14 0353 07/23/14 0620  NA 138 138 136  K 3.7 4.9 4.2  CL 100 101 101  CO2 25 25 24   GLUCOSE 95 76 118*  BUN 47* 50* 46*  CREATININE 3.10* 3.23* 3.46*  CALCIUM 8.5 8.3* 8.1*   LFT  Recent Labs  07/23/14 0620  PROT 5.4*  ALBUMIN 2.2*  AST 19  ALT 10*  ALKPHOS 116  BILITOT 0.6   PT/INR No results for input(s): LABPROT, INR in the last 72 hours. Hepatitis Panel No results for input(s): HEPBSAG, HCVAB, HEPAIGM, HEPBIGM in the last 72 hours.  Studies/Results: Dg Chest Port 1 View  07/22/2014   CLINICAL DATA:  Right-sided pleural effusion. History of moderate aortic stenosis and atrial fibrillation  EXAM: PORTABLE CHEST - 1 VIEW  COMPARISON:  07/19/2014; 07/18/2014; 07/14/2014; chest CT -  07/14/2014  FINDINGS: Grossly unchanged cardiac silhouette and mediastinal contours with atherosclerotic plaque within the thoracic aorta. Interval increase in small right-sided pleural effusion with associated worsening right mid and lower lung heterogeneous / consolidative opacities. Unchanged trace left-sided effusion with associated left basilar heterogeneous opacities. No pneumothorax. Unchanged bones.  IMPRESSION: 1. Interval increase in small right-sided effusion with associated worsening right mid and lower lung opacities, atelectasis versus infiltrate. 2. Unchanged trace left-sided effusion with associated left basilar/retrocardiac opacities   Electronically Signed   By: Sandi Mariscal M.D.   On: 07/22/2014 08:05      Assessment & Plan  *No ill effects since clamping cholecystostomy tube  LFTs are normal.  Proceed with repeat cholangiogram via cholecystostomy tubein a.m.**  Principal Problem:   Acute respiratory failure with hypoxia Active Problems:   Anemia   Diabetes mellitus, type II   Hypertension   Chronic atrial fibrillation   Chronic diastolic CHF (congestive heart failure)   CKD (chronic kidney disease), stage IV   Moderate aortic stenosis   Type 2 diabetes mellitus with diabetic chronic kidney disease   Acute on chronic diastolic CHF (congestive heart failure)   Choledocholithiasis with acute cholecystitis   Pleural effusion, right   Pleural effusion   Lung mass   DNR (do not resuscitate)   Pleural effusion on right   Bile duct obstruction   Creatinine elevation     LOS: 9  days   [name]  07/23/2014, 1:18 PM

## 2014-07-23 NOTE — Progress Notes (Signed)
[facility] PROGRESS NOTE  [name] JGO:[id number] DOB: Dec 09, 1925 DOA: 07/14/2014 PCP: Abigail Miyamoto, MD  Brief Summary  [name] is a 81 y.o. female, with a complex past medical history including chronic kidney disease (baseline creatinine 2.3-2.6), chronic diastolic heart failure with pulmonary hypertension (last EF in February 2016 was 65-70%), diabetes mellitus, moderate to severe aortic stenosis, atrial fibrillation (not on Coumadin), colon cancer 2 status post colostomy, and recent acalculous cholecystitis status post perc cholecystostomy tube placement.  She underwent ERCP with stone extraction on 4/19.  She presented to the ER on 4/22 with increasing dyspnea, lower extremity swelling, and low grade fever.  In the emergency department she was mildly hypoxic 88% on room air and CXR demonstrated an enlarged right-sided moderate pleural effusion.  PCCM was consulted and she underwent thoracentesis.  Pleural fluid was exudative by Lights criteria, however, culture was negative.  Suspect she has some transudative component also given her heart failure.  Her repeat CXR demonstrated worsening effusion and pulmonary edema and her diuretics were escalated.  She will undergo repeat thoracentesis today.  Increasing her lasix to 120mg  IV BID today also.  Her cholecystostomy tube was clamped in preparation for removal.    Assessment/Plan  Acute hypoxic respiratory failure secondary to RIGHT exudative pleural effusion likely related to ongoing gallbladder inflammation, but will eventually need repeat Ct chest outpatient to r/o underline malignancy. -   Underwent thoracentesis on 4/22 with removal of 1.5L cloudy pleural fluid by pulmonology  -  Exudative by light's criteria by total protein and LDH, Cytology:  No malignant cells, + acute inflammatory and reactive mesothelial cells, Pleural fluid culture NGTD,ANA negative, RF positive. -Started levofloxacin for PNA treatment (also covers the  citrobacter found in her abscessed gallbladder) per PCCM recs -  IR consult for repeat thoracentesis on 4/27 with 680cc fluids removal, -  CXR on 4/30 showed fluid reaccumulates, PCCM to decide pleurex  Acute on chronic diastolic CHF,  -  Last echo shows EF of 65 - 70 in 04/2014. - Weight down, lower extremity edema resolved with lasix,  -4/29, cr continue increase,.  D/c lasix , -4/30, 5/1 cr slight worsening, ua concentrated, no infection, continue hold lasix. Encourage oral fluids intake, will try to avoid IVF.  DM II 05/10/2014 hemoglobin A1c 6.8, CBG at goal but mildly low this AM -  Hold oral medications.  -  D/c Lantus  -  Continue SSI  Chronic A Fib, CHADS-VASc= 5 -  On coreg, Rate controlled with some 2.5 second pauses -  Not a candidate for anticoagulation due to falls. -  D/c'd telemetry  CAD based on CT scan 4/22 -   Continue aspirin, BB, change to high dose statin  CKD stage IV, baseline creatinine 2.5-3, at baseline -   Minimize nephrotoxins and renally dose medications -4/30, 5/1 cr slight worsening, ua concentrated, no infection, continue hold lasix. Encourage oral fluids intake, will try to avoid IVF.  Acute Cholecystitis in 04/2014 s/p percutaneous cholecystostomy tube placement  -  Underwent outpatient ERCP on 4/19. Lipase is 38. LFTs WNL. -  Discussed case with Dr. Pyrtle/Dr. Deatra Ina -  Perc chole clamped on 4/24 -  IR with cholangiogram , no filling of duodenum,  repeat ercp on 4/29, no obstruction -repeat cholangiogram through cholecystostomy tube on 5/2, might be able to remove tube tomorrow (patient request pain meds prior to tube removal), f/u gi and surgery rec's.   Chronic back pain. -  Continue Hydrocodone at home dose.  Hypertension, BP stable -  Continue carvedilol to 3.125mg  BID  Hx of colon cancer 2002 s/p right hemicolectomy and colostomy, BM firm -  Continue miralax  Normocytic anemia, previously iron deficient -  Iron studies c/w  inflammation as ferritin quite elevated, b12 1627, folate 8.7, TSH 0.935 - hgb stable around 8.2-8.3  Diet:  diabetic Access:  PIV IVF:  off Proph:  heparin  Code Status: DNR Family Communication: patient Disposition Plan:  Remain inpatient  Consultants:  Pulmonology  GI/CCS  IR  Procedures:  Thoracentesis 4/22 with removal of 1.5L cloudy pleural fluid  Thoracentesis 4/27 with 680 cc removal  Cholangiogram on 4/27  ERCP 4/29  Antibiotics:  levaquin from 4/25, last dose on 5/2 to finish 7days treatment.  HPI/Subjective:  Sitting in chair, Report feeling better, no  dizzy when standing up, denies sob, no chest pain, family  in room.  Cr worsened.  Objective: Filed Vitals:   07/23/14 0525 07/23/14 0550 07/23/14 0745 07/23/14 1458  BP: 113/52  125/55 108/52  Pulse: 84  81 66  Temp: 98.8 F (37.1 C)   98.2 F (36.8 C)  TempSrc: Oral   Oral  Resp: 18   20  Height:      Weight:  74.571 kg (164 lb 6.4 oz)    SpO2: 95%   98%    Intake/Output Summary (Last 24 hours) at 07/23/14 1609 Last data filed at 07/23/14 1457  Gross per 24 hour  Intake    840 ml  Output    450 ml  Net    390 ml   Filed Weights   07/20/14 0536 07/21/14 0605 07/23/14 0550  Weight: 75.025 kg (165 lb 6.4 oz) 74.027 kg (163 lb 3.2 oz) 74.571 kg (164 lb 6.4 oz)    Exam:   General:  Average weight female, drowsy from ecrp, but aaox3  HEENT:  NCAT, MMM  Cardiovascular:  RRR, nl S1, S2, 3/6 systolic murmur at the left lateral chest   Respiratory:  Diminished on the right , no obvious rales today, diminished at left base, no wheezes, no increased WOB  Abdomen:   NABS, soft, ND, NT, RUQ drain. Chronic colostomy  MSK:   Normal tone and bulk, no edema  Neuro:  Grossly intact  Data Reviewed: Basic Metabolic Panel:  Recent Labs Lab 07/19/14 0442 07/20/14 0356 07/21/14 0416 07/22/14 0353 07/23/14 0620  NA 138 138 138 138 136  K 3.9 4.0 3.7 4.9 4.2  CL 101 100 100 101 101   CO2 24 27 25 25 24   GLUCOSE 101* 129* 95 76 118*  BUN 50* 46* 47* 50* 46*  CREATININE 2.75* 2.84* 3.10* 3.23* 3.46*  CALCIUM 8.3* 8.3* 8.5 8.3* 8.1*   Liver Function Tests:  Recent Labs Lab 07/19/14 0442 07/20/14 0356 07/21/14 0416 07/22/14 0353 07/23/14 0620  AST 14 18 16 31 19   ALT 9 8 8 10  10*  ALKPHOS 114 119* 110 111 116  BILITOT 0.5 0.3 0.4 1.0 0.6  PROT 5.5* 5.4* 5.4* 5.4* 5.4*  ALBUMIN 2.3* 2.2* 2.3* 2.2* 2.2*   No results for input(s): LIPASE, AMYLASE in the last 168 hours. No results for input(s): AMMONIA in the last 168 hours. CBC:  Recent Labs Lab 07/19/14 0442 07/20/14 0356 07/21/14 0416 07/22/14 0353 07/23/14 0620  WBC 8.3 8.6 8.4 9.5 7.9  HGB 8.4* 8.8* 8.6* 8.7* 8.6*  HCT 26.8* 28.6* 27.9* 28.3* 28.1*  MCV 90.8 91.4 91.2 92.2 92.1  PLT 183 188 186 208 158  Cardiac Enzymes: No results for input(s): CKTOTAL, CKMB, CKMBINDEX, TROPONINI in the last 168 hours. BNP (last 3 results)  Recent Labs  03/15/14 0914 07/14/14 1125  BNP 941.4* 526.1*    ProBNP (last 3 results) No results for input(s): PROBNP in the last 8760 hours.  CBG:  Recent Labs Lab 07/22/14 1139 07/22/14 1623 07/22/14 2118 07/23/14 0557 07/23/14 1124  GLUCAP 226* 166* 170* 121* 179*    Recent Results (from the past 240 hour(s))  Urine culture     Status: None   Collection Time: 07/14/14  1:14 PM  Result Value Ref Range Status   Specimen Description URINE, RANDOM  Final   Special Requests NONE  Final   Colony Count NO GROWTH Performed at [facility]   Final   Culture NO GROWTH Performed at [facility]   Final   Report Status 07/16/2014 FINAL  Final  Body fluid culture     Status: None   Collection Time: 07/14/14  4:04 PM  Result Value Ref Range Status   Specimen Description FLUID RIGHT PLEURAL  Final   Special Requests Normal  Final   Gram Stain   Final    RARE WBC PRESENT, PREDOMINANTLY PMN NO ORGANISMS SEEN Performed at [facility]    Culture   Final    NO GROWTH 3 DAYS Performed at [facility]    Report Status 07/18/2014 FINAL  Final  Body fluid culture     Status: None   Collection Time: 07/19/14  9:59 AM  Result Value Ref Range Status   Specimen Description FLUID PLEURAL RIGHT  Final   Special Requests NONE  Final   Gram Stain   Final    NO WBC SEEN NO ORGANISMS SEEN Performed at [facility]    Culture   Final    NO GROWTH 3 DAYS Performed at [facility]    Report Status 07/23/2014 FINAL  Final     Studies: Dg Chest Port 1 View  07/22/2014   CLINICAL DATA:  Right-sided pleural effusion. History of moderate aortic stenosis and atrial fibrillation  EXAM: PORTABLE CHEST - 1 VIEW  COMPARISON:  07/19/2014; 07/18/2014; 07/14/2014; chest CT - 07/14/2014  FINDINGS: Grossly unchanged cardiac silhouette and mediastinal contours with atherosclerotic plaque within the thoracic aorta. Interval increase in small right-sided pleural effusion with associated worsening right mid and lower lung heterogeneous / consolidative opacities. Unchanged trace left-sided effusion with associated left basilar heterogeneous opacities. No pneumothorax. Unchanged bones.  IMPRESSION: 1. Interval increase in small right-sided effusion with associated worsening right mid and lower lung opacities, atelectasis versus infiltrate. 2. Unchanged trace left-sided effusion with associated left basilar/retrocardiac opacities   Electronically Signed   By: Sandi Mariscal M.D.   On: 07/22/2014 08:05    Scheduled Meds: . aspirin EC  81 mg Oral Daily  . atorvastatin  40 mg Oral q1800  . calcitRIOL  0.25 mcg Oral Daily  . carvedilol  3.125 mg Oral BID WC  . docusate sodium  100 mg Oral BID  . ferrous sulfate  325 mg Oral BID WC  . insulin aspart  0-9 Units Subcutaneous TID WC  . levofloxacin  500 mg Oral Q48H  . LORazepam  0.5 mg Oral BID  . mirtazapine  7.5 mg Oral QHS  . polyethylene glycol  17 g Oral Daily  .  saccharomyces boulardii  250 mg Oral BID  . sodium chloride  3 mL Intravenous Q12H  . sodium chloride  3 mL Intravenous Q12H  . vitamin B-12  1,000 mcg Oral Daily   Continuous Infusions:   Principal Problem:   Acute respiratory failure with hypoxia Active Problems:   Anemia   Diabetes mellitus, type II   Hypertension   Chronic atrial fibrillation   Chronic diastolic CHF (congestive heart failure)   CKD (chronic kidney disease), stage IV   Moderate aortic stenosis   Type 2 diabetes mellitus with diabetic chronic kidney disease   Acute on chronic diastolic CHF (congestive heart failure)   Choledocholithiasis with acute cholecystitis   Pleural effusion, right   Pleural effusion   Lung mass   DNR (do not resuscitate)   Pleural effusion on right   Bile duct obstruction   Creatinine elevation    Time spent: 25 min    Rhyker Silversmith  [facility] Pager [phone]. If 7PM-7AM, please contact night-coverage at [email or web address], password [facility] 07/23/2014, 4:09 PM  LOS: 9 days

## 2014-07-24 ENCOUNTER — Inpatient Hospital Stay (HOSPITAL_COMMUNITY): Payer: Medicare Other

## 2014-07-24 ENCOUNTER — Encounter (HOSPITAL_COMMUNITY): Payer: Self-pay | Admitting: Gastroenterology

## 2014-07-24 LAB — COMPREHENSIVE METABOLIC PANEL
ALBUMIN: 2.3 g/dL — AB (ref 3.5–5.0)
ALT: 9 U/L — AB (ref 14–54)
AST: 17 U/L (ref 15–41)
Alkaline Phosphatase: 106 U/L (ref 38–126)
Anion gap: 11 (ref 5–15)
BUN: 44 mg/dL — ABNORMAL HIGH (ref 6–20)
CO2: 24 mmol/L (ref 22–32)
CREATININE: 3 mg/dL — AB (ref 0.44–1.00)
Calcium: 8.3 mg/dL — ABNORMAL LOW (ref 8.9–10.3)
Chloride: 103 mmol/L (ref 101–111)
GFR calc Af Amer: 15 mL/min — ABNORMAL LOW (ref >60)
GFR calc non Af Amer: 13 mL/min — ABNORMAL LOW (ref >60)
Glucose, Bld: 93 mg/dL (ref 70–99)
POTASSIUM: 4.4 mmol/L (ref 3.5–5.1)
SODIUM: 138 mmol/L (ref 135–145)
Total Bilirubin: 0.3 mg/dL (ref 0.3–1.2)
Total Protein: 5.3 g/dL — ABNORMAL LOW (ref 6.5–8.1)

## 2014-07-24 LAB — GLUCOSE, CAPILLARY
GLUCOSE-CAPILLARY: 112 mg/dL — AB (ref 70–99)
GLUCOSE-CAPILLARY: 143 mg/dL — AB (ref 70–99)
Glucose-Capillary: 159 mg/dL — ABNORMAL HIGH (ref 70–99)

## 2014-07-24 LAB — CBC
HCT: 28.4 % — ABNORMAL LOW (ref 36.0–46.0)
Hemoglobin: 8.9 g/dL — ABNORMAL LOW (ref 12.0–15.0)
MCH: 28.7 pg (ref 26.0–34.0)
MCHC: 31.3 g/dL (ref 30.0–36.0)
MCV: 91.6 fL (ref 78.0–100.0)
PLATELETS: 156 10*3/uL (ref 150–400)
RBC: 3.1 MIL/uL — ABNORMAL LOW (ref 3.87–5.11)
RDW: 15.3 % (ref 11.5–15.5)
WBC: 6.8 10*3/uL (ref 4.0–10.5)

## 2014-07-24 LAB — OTHER BODY FLUID CHEMISTRY

## 2014-07-24 MED ORDER — IOHEXOL 300 MG/ML  SOLN
50.0000 mL | Freq: Once | INTRAMUSCULAR | Status: AC | PRN
  Administered 2014-07-24: 10 mL via INTRAVENOUS

## 2014-07-24 MED ORDER — CARVEDILOL 3.125 MG PO TABS: 3.1250 mg | ORAL_TABLET | Freq: Two times a day (BID) | ORAL | Status: DC

## 2014-07-24 MED ORDER — FERROUS SULFATE 325 (65 FE) MG PO TABS: 325.0000 mg | ORAL_TABLET | Freq: Two times a day (BID) | ORAL | Status: DC

## 2014-07-24 NOTE — Progress Notes (Signed)
Physical Therapy Treatment Patient Details Name: [name] MRN: [id number] DOB: Jul 26, 1925 Today's Date: 07/24/2014    History of Present Illness Patient presents to the ER from home by EMS. Patient has been experiencing shortness of breath for the last 3 days. Patient does have a history of congestive heart failure, has increased swelling of her legs. Recently underwent ERCP On 4/19 for retained bile duct stones and has cystostomy tube.     PT Comments    Pt ambulated 200' with RW independently, no LOB. Pt has 27* assist from daughter at home and is ready to DC from PT standpoint.   Follow Up Recommendations  No PT follow up     Equipment Recommendations  None recommended by PT    Recommendations for Other Services       Precautions / Restrictions Precautions Precautions: Fall (pt lives with daughter who stated pt hasn't had any falls in past few months) Precaution Comments: colostomy Restrictions Weight Bearing Restrictions: No    Mobility  Bed Mobility               General bed mobility comments: in chair on arrival.  Transfers Overall transfer level: Needs assistance Equipment used: Rolling walker (2 wheeled)   Sit to Stand: Modified independent (Device/Increase time)         General transfer comment: Pushes up from armrests to stand  Ambulation/Gait Ambulation/Gait assistance: Modified independent (Device/Increase time) Ambulation Distance (Feet): 200 Feet Assistive device: Rolling walker (2 wheeled) Gait Pattern/deviations: Step-through pattern;Trunk flexed;Decreased stride length   Gait velocity interpretation: at or above normal speed for age/gender General Gait Details: walks with her RW a bit too far in front but is safe, likely a function of her regularly using rollator RW   Stairs            Wheelchair Mobility    Modified Rankin (Stroke Patients Only)       Balance Overall balance assessment: No apparent balance deficits (not  formally assessed)                                  Cognition Arousal/Alertness: Awake/alert Behavior During Therapy: WFL for tasks assessed/performed Overall Cognitive Status: Within Functional Limits for tasks assessed                      Exercises      General Comments        Pertinent Vitals/Pain Pain Assessment: No/denies pain    Home Living                      Prior Function            PT Goals (current goals can now be found in the care plan section) Acute Rehab PT Goals Patient Stated Goal: to get some rest PT Goal Formulation: With patient/family Time For Goal Achievement: 07/29/14 Potential to Achieve Goals: Good Progress towards PT goals: Progressing toward goals    Frequency  Min 3X/week    PT Plan Current plan remains appropriate    Co-evaluation             End of Session Equipment Utilized During Treatment: Gait belt Activity Tolerance: Patient tolerated treatment well Patient left: with family/visitor present;in chair     Time: 8101-7510 PT Time Calculation (min) (ACUTE ONLY): 10 min  Charges:  $Gait Training: 8-22 mins  G Codes:      Philomena Doheny 07/24/2014, 9:54 AM [phone]

## 2014-07-24 NOTE — Progress Notes (Signed)
Pt is being discharged home. Pt has been provided with discharge instructions. RN went over instructions with the patient and answered all questions she had. Pt is being transported home by her family who is at the bedside

## 2014-07-24 NOTE — Procedures (Signed)
Chole inejction Cystic and CD ducts patent Chole drain D/C'ed No comp

## 2014-07-24 NOTE — Progress Notes (Addendum)
Name: [name] MRN: [id number] DOB: 24-Sep-1925    ADMISSION DATE:  07/14/2014 CONSULTATION DATE:  4/22  REFERRING MD :  Betsey Holiday   CHIEF COMPLAINT:  Right pleural effusion   BRIEF PATIENT DESCRIPTION:  81 year old female w/ sig PMH chronic diastolic HF, AS, CAF (not on anticoagulation). Recently underwent ERCP On 4/19 for retained bile duct stones. Discharged to home w/ cystostomy tube. Since d/c has had 3 d h/o progressive LE edema increased SOB and right sided pleuritic type CP for which she presented to the ER. On CXR was found to have large right pleural effusion for which we have been consulted.    SIGNIFICANT EVENTS  ERCP 4/19 R thoracentesis 4/22> 1.5L off - minimal exudate by total protein, 4500 WBC's with 72% neutrophils No malignant cells. Acute inflammation +, RF positive CT chest 4/22> (POST thora) Moderate right-sided pleural effusion with small left-sided pleural effusion. Associated compressive atelectasis. Etiology is uncertain, as there is no evidence of overt pulmonary edema, and there is no evidence of lobar pneumonia. Mixed linear and ground-glass opacities of the bilateral lungs favored to represent atelectasis/ scarring. 4/27>  Repeat thora 680cc -> exudate but more lymphocytic 5/2 > Perc bili drain out   SUBJECTIVE/OVERNIGHT/INTERVAL HX: CXR with stable appearance of R sided effusion 4/30. She has no complaints of SOB, even with ambulation of about 200 feet. Wants to go home.  VITAL SIGNS: Temp:  [98.2 F (36.8 C)-98.3 F (36.8 C)] 98.2 F (36.8 C) (05/02 0635) Pulse Rate:  [66-72] 72 (05/02 0635) Resp:  [18-20] 18 (05/02 0635) BP: (108-110)/(52-64) 110/64 mmHg (05/02 0635) SpO2:  [97 %-98 %] 98 % (05/02 0635) Weight:  [74.489 kg (164 lb 3.5 oz)] 74.489 kg (164 lb 3.5 oz) (05/02 7169)  PHYSICAL EXAMINATION:  General:  81 year old female, dcnditiond looking, sitting in chair Neuro:  Awake, alert, oriented. No focal def  HEENT:  [place], No JVD    Cardiovascular:  Reg irreg + IV/VI murmur c/w AS Lungs:  Diminished R base. Resps even unlabored. Abdomen:  Soft, non-tender + bowel sounds  Musculoskeletal:  Intact  Skin:  +1 LE edema. Chronic venous changes   PULMONARY No results for input(s): PHART, PCO2ART, PO2ART, HCO3, TCO2, O2SAT in the last 168 hours.  Invalid input(s): PCO2, PO2  CBC  Recent Labs Lab 07/22/14 0353 07/23/14 0620 07/24/14 0420  HGB 8.7* 8.6* 8.9*  HCT 28.3* 28.1* 28.4*  WBC 9.5 7.9 6.8  PLT 208 158 156    COAGULATION No results for input(s): INR in the last 168 hours.  CARDIAC   No results for input(s): TROPONINI in the last 168 hours. No results for input(s): PROBNP in the last 168 hours.   CHEMISTRY  Recent Labs Lab 07/20/14 0356 07/21/14 0416 07/22/14 0353 07/23/14 0620 07/24/14 0420  NA 138 138 138 136 138  K 4.0 3.7 4.9 4.2 4.4  CL 100 100 101 101 103  CO2 27 25 25 24 24   GLUCOSE 129* 95 76 118* 93  BUN 46* 47* 50* 46* 44*  CREATININE 2.84* 3.10* 3.23* 3.46* 3.00*  CALCIUM 8.3* 8.5 8.3* 8.1* 8.3*   Estimated Creatinine Clearance: 13.3 mL/min (by C-G formula based on Cr of 3).   LIVER  Recent Labs Lab 07/20/14 0356 07/21/14 0416 07/22/14 0353 07/23/14 0620 07/24/14 0420  AST 18 16 31 19 17   ALT 8 8 10  10* 9*  ALKPHOS 119* 110 111 116 106  BILITOT 0.3 0.4 1.0 0.6 0.3  PROT  5.4* 5.4* 5.4* 5.4* 5.3*  ALBUMIN 2.2* 2.3* 2.2* 2.2* 2.3*     INFECTIOUS No results for input(s): LATICACIDVEN, PROCALCITON in the last 168 hours.   ENDOCRINE CBG (last 3)   Recent Labs  07/23/14 1626 07/23/14 2125 07/24/14 0557  GLUCAP 176* 132* 112*    US Renal  07/23/2014   CLINICAL DATA:  Elevated creatinine. History of hypertension, diabetes, chronic kidney disease stage 4.  EXAM: RENAL / URINARY TRACT ULTRASOUND COMPLETE  COMPARISON:  05/09/2014 and previous CT exam is 07/14/2014, 10/14/2009  FINDINGS: Right Kidney:  Length: 5.5 cm. Renal parenchymal thinning. Echogenic renal  parenchyma. No focal mass or hydronephrosis.  Left Kidney:  Length: 8.7 cm. Renal parenchymal thinning. Echogenic renal parenchyma. No hydronephrosis. Upper pole cyst is 1.6 x 1.1 x 1.1 cm and contains internal echoes.  Bladder:  Appears normal for degree of bladder distention.  Bilateral pleural effusions are present.  IMPRESSION: 1. Bilateral renal parenchymal thinning and echogenic renal parenchyma. 2. Small hypoechoic lesion within the upper pole of the left kidney is consistent with a benign cyst when compared with prior studies. 3. No hydronephrosis.   Electronically Signed   By: Nolon Nations M.D.   On: 07/23/2014 16:42    ASSESSMENT / PLAN: Biliary obstruction s/p recent ERCP 4/19  - Acute hypoxemic respiratory failure - in setting of Large right Pleural effusion, HCAP, dCHF and severe AS  - Large R pleural effusion - s/p thoracentesis 4/22. - non diagnostic exudate. Acute neutrophils. RF+  - has recurrred 07/19/2014 - s.p 680cc thora - exudate lymphocytic .   This is likely parapneumonic effusion related to Gall bladder issues given initial neutrophils 07/14/14 and now repeat is probably transformaton to chronic. Suspect unlikely to recur but risk remains.    Plan Repeat CXR again today She is asymptomatic, but if increase in effusion size may need Pleur-ex placed for several weeks Levofloxacin stop date today.  Aggressive IS Will need outpatient CT and pulm f/u to rule out underlying malignancy  Georgann Housekeeper, AGACNP-BC [facility] Pager [phone] or [phone]  07/24/2014 11:46 AM    Attending:  I have seen and examined the patient with nurse practitioner/resident and agree with the note above.   She is doing well and asymptomatic.  Discussed with Dr. Algis Liming this morning. Her effusion is sympathetic from her gallbladder process, cultures negative, cytology negative.  Exam with diminished breath sounds RLL, dullness to percussion, remainder of  exam as above.  Images from CXR reviewed>  Moderate sized pleural effusion on the right persists, but unchanged from prior.    I explained to her daughters that considering she is feeling well she does not need to have another thoracentesis.  However if she becomes dyspneic or has chest pain thin she should have another thoracentesis.    I recommended that she have another CXR in 1-2 weeks with her PCP  OK from my standpoint to be discharged home  Roselie Awkward, MD [facility] Pager: [phone] Cell: [phone] If no response, call [phone]

## 2014-07-24 NOTE — Discharge Summary (Signed)
Physician Discharge Summary  [name] ZDG:[id number] DOB: [birth year] DOA: 07/14/2014  PCP: Abigail Miyamoto, MD  Admit date: 07/14/2014 Discharge date: 07/24/2014  Time spent: Greater than 30 minutes  Recommendations for Outpatient Follow-up:  1. Dr. Briscoe Deutscher, PCP in 5 days with repeat labs (CBC & BMP). Will need repeat chest x-ray in 1-2 weeks to follow-up on right sided pleural effusion. 2. Dr. Georganna Skeans, Gen. surgery in 2 weeks 3. Dr. Teena Irani, GI: As needed  Discharge Diagnoses:  Principal Problem:   Acute respiratory failure with hypoxia Active Problems:   Anemia   Diabetes mellitus, type II   Hypertension   Chronic atrial fibrillation   Chronic diastolic CHF (congestive heart failure)   CKD (chronic kidney disease), stage IV   Moderate aortic stenosis   Type 2 diabetes mellitus with diabetic chronic kidney disease   Acute on chronic diastolic CHF (congestive heart failure)   Choledocholithiasis with acute cholecystitis   Pleural effusion, right   Pleural effusion   Lung mass   DNR (do not resuscitate)   Pleural effusion on right   Bile duct obstruction   Creatinine elevation   Discharge Condition: Improved & Stable  Diet recommendation: Diabetic and heart healthy diet.  Filed Weights   07/21/14 0605 07/23/14 0550 07/24/14 0635  Weight: 74.027 kg (163 lb 3.2 oz) 74.571 kg (164 lb 6.4 oz) 74.489 kg (164 lb 3.5 oz)    History of present illness:  [name] is a 81 y.o. female, with a complex past medical history including chronic kidney disease (baseline creatinine 2.5-3), chronic diastolic heart failure with pulmonary hypertension (last EF in February 2016 was 65-70%), diabetes mellitus, moderate to severe aortic stenosis, atrial fibrillation (not on Coumadin), colon cancer 2 status post colostomy, and recent acalculous cholecystitis status post perc cholecystostomy tube placement. She underwent ERCP with stone extraction on 4/19. She presented to the  ER on 4/22 with increasing dyspnea, lower extremity swelling, and low grade fever. In the emergency department she was mildly hypoxic 88% on room air and CXR demonstrated an enlarged right-sided moderate pleural effusion. PCCM was consulted and she underwent thoracentesis. Pleural fluid was exudative by Lights criteria, however, culture was negative. Suspect she has some transudative component also given her heart failure. Her repeat CXR demonstrated worsening effusion and pulmonary edema and her diuretics were escalated.  Hospital Course:   Acute hypoxic respiratory failure  - secondary to RIGHT exudative pleural effusion likely related to ongoing gallbladder inflammation, but will eventually need repeat Ct chest outpatient to r/o underline malignancy. - Underwent thoracentesis on 4/22 with removal of 1.5L cloudy pleural fluid by pulmonology - Exudative by light's criteria by total protein and LDH, Cytology: No malignant cells, + acute inflammatory and reactive mesothelial cells, Pleural fluid culture NGTD,ANA negative, RF positive. - Started levofloxacin for PNA treatment (also covers the citrobacter found in her abscessed gallbladder) per PCCM recs-antibiotics discontinued - IR consult for repeat thoracentesis on 4/27 with 680cc fluids removal, - CXR on 4/30 showed fluid reaccumulation - Seen by pulmonology today. Patient is asymptomatic without dyspnea. Hypoxia has resolved. As per pulmonology, her effusion is sympathetic from her gallbladder process, culture and cytology negative. Chest x-ray shows moderate sized pleural effusion on the right but unchanged from prior. Recommend repeating chest x-ray in 1-2 weeks with her PCP and pulmonary have cleared her for discharge home.   Acute on chronic diastolic CHF,  - Last echo shows EF of 65 - 70 in 04/2014. - Weight down,  lower extremity edema resolved with lasix,  - 4/29, cr continue increase,. D/c lasix , - 4/30, 5/1 cr slight  worsening, ua concentrated, no infection, continue hold lasix. Encourage oral fluids intake, will try to avoid IVF. - On reviewing her previous labs, her creatinine lately has been ranging mostly in the 2. 6-3 range and hence currently close to baseline. Resume Lasix at discharge for volume overload with close monitoring of BMP as outpatient.  DM II  - 05/10/2014 hemoglobin A1c 6.8, - Treated in the hospital with SSI. - DC home on previous medications including Lantus when necessary and linagliptin.  Chronic A Fib,  -  CHADS-VASc= 5 - On coreg, Rate controlled with some 2.5 second pauses. Carvedilol dose was reduced to 3.125 MG twice a day during this admission. - Not a candidate for anticoagulation due to falls. - D/c'd telemetry  CAD  - based on CT scan 4/22 - Continue aspirin, BB, & statin  CKD stage IV, baseline creatinine  - 2.5-3, at baseline - Minimize nephrotoxins and renally dose medications - 4/30, 5/1 cr slight worsening, ua concentrated, no infection, continue hold lasix. Encourage oral fluids intake, will try to avoid IVF. - Consider OP Nephrology consultation/follow up  Acute Cholecystitis in 04/2014 s/p percutaneous cholecystostomy tube placement  - Underwent outpatient ERCP on 4/19. Lipase is 38. LFTs WNL. - Perc chole clamped on 4/24 - IR with cholangiogram , no filling of duodenum, repeat ercp on 4/29, no obstruction - Percutaneous biliary tube pulled 5/2 AM. Patient asymptomatic of GI symptoms. - GI has seen and cleared her for discharge - Outpatient follow-up with general surgery in 2 weeks.  Chronic back pain. - Continue Hydrocodone at home dose. - No reported pain issues today.  Hypertension,  - BP stable - Continue carvedilol to 3.125mg  BID  Hx of colon cancer 2002 s/p right hemicolectomy and colostomy - Continue when necessary laxatives at home.  Normocytic anemia,  - previously iron deficient - Iron studies c/w inflammation as  ferritin quite elevated, b12 1627, folate 8.7, TSH 0.935 - hgb stable  Consultants:  Pulmonology  GI/CCS  IR  Procedures:  Thoracentesis 4/22 with removal of 1.5L cloudy pleural fluid  Thoracentesis 4/27 with 680 cc removal  Cholangiogram on 4/27  ERCP 4/29  Antibiotics:  levaquin from 4/25, last dose on 5/2 to finish 7days treatment.   Discharge Exam:  Complaints: Patient seen this morning. Anxious and insisting on going home today. Denies any complaints. Denies dyspnea, cough, chest pain, abdominal pain, nausea, vomiting or diarrhea.  Filed Vitals:   07/23/14 1458 07/23/14 2122 07/24/14 0635 07/24/14 1520  BP: 108/52 108/56 110/64 118/52  Pulse: 66 68 72 74  Temp: 98.2 F (36.8 C) 98.3 F (36.8 C) 98.2 F (36.8 C) 98.3 F (36.8 C)  TempSrc: Oral Oral Oral Oral  Resp: 20 18 18 18   Height:      Weight:   74.489 kg (164 lb 3.5 oz)   SpO2: 98% 97% 98% 100%    General exam: Moderately built and nourished pleasant elderly female sitting up comfortably on chair this morning. Respiratory system: Diminished breath sounds in the bases right >left but otherwise clear to auscultation. No increased work of breathing. Cardiovascular system: S1 & S2 heard, RRR. No JVD, murmurs, gallops, clicks or pedal edema. Not on telemetry. Gastrointestinal system: Abdomen is nondistended, soft and nontender. Normal bowel sounds heard. Central nervous system: Alert and oriented. No focal neurological deficits. Extremities: Symmetric 5 x 5 power.  Discharge Instructions  Discharge Instructions    (HEART FAILURE PATIENTS) Call MD:  Anytime you have any of the following symptoms: 1) 3 pound weight gain in 24 hours or 5 pounds in 1 week 2) shortness of breath, with or without a dry hacking cough 3) swelling in the hands, feet or stomach 4) if you have to sleep on extra pillows at night in order to breathe.    Complete by:  As directed      Call MD for:  difficulty breathing,  headache or visual disturbances    Complete by:  As directed      Call MD for:  extreme fatigue    Complete by:  As directed      Call MD for:  persistant dizziness or light-headedness    Complete by:  As directed      Call MD for:  persistant nausea and vomiting    Complete by:  As directed      Call MD for:  redness, tenderness, or signs of infection (pain, swelling, redness, odor or green/yellow discharge around incision site)    Complete by:  As directed      Call MD for:  severe uncontrolled pain    Complete by:  As directed      Call MD for:  temperature >100.4    Complete by:  As directed      Diet - low sodium heart healthy    Complete by:  As directed      Diet Carb Modified    Complete by:  As directed      Increase activity slowly    Complete by:  As directed             Medication List    TAKE these medications        acetaminophen 500 MG tablet  Commonly known as:  TYLENOL  Take 500 mg by mouth every 6 (six) hours as needed for mild pain.     aspirin EC 81 MG tablet  Take 81 mg by mouth daily.     calcitRIOL 0.25 MCG capsule  Commonly known as:  ROCALTROL  Take 0.25 mcg by mouth daily.     carvedilol 3.125 MG tablet  Commonly known as:  COREG  Take 1 tablet (3.125 mg total) by mouth 2 (two) times daily with a meal.     clotrimazole-betamethasone lotion  Commonly known as:  LOTRISONE  Apply 1 application topically daily as needed (apply to rash on stomach as needed).     docusate sodium 100 MG capsule  Commonly known as:  COLACE  Take 100 mg by mouth daily as needed for constipation.     ferrous sulfate 325 (65 FE) MG tablet  Take 1 tablet (325 mg total) by mouth 2 (two) times daily with a meal.     furosemide 80 MG tablet  Commonly known as:  LASIX  Take 80 mg by mouth every morning.     HYDROcodone-acetaminophen 5-325 MG per tablet  Commonly known as:  NORCO/VICODIN  Take 1 tablet by mouth every 4 (four) hours as needed for moderate pain.      insulin glargine 100 UNIT/ML injection  Commonly known as:  LANTUS  Inject 8 Units into the skin at bedtime as needed (if cbg over 150).     linagliptin 5 MG Tabs tablet  Commonly known as:  TRADJENTA  Take 5 mg by mouth daily.     LORazepam 0.5 MG tablet  Commonly known as:  ATIVAN  Take 0.5 mg by mouth 2 (two) times daily.     meclizine 12.5 MG tablet  Commonly known as:  ANTIVERT  Take 12.5 mg by mouth as needed for dizziness.     mirtazapine 7.5 MG tablet  Commonly known as:  REMERON  Take 7.5 mg by mouth at bedtime.     promethazine 12.5 MG tablet  Commonly known as:  PHENERGAN  Take 1 tablet (12.5 mg total) by mouth every 6 (six) hours as needed for nausea or vomiting.     simvastatin 20 MG tablet  Commonly known as:  ZOCOR  Take 1 tablet (20 mg total) by mouth at bedtime.     vitamin B-12 1000 MCG tablet  Commonly known as:  CYANOCOBALAMIN  Take 1,000 mcg by mouth daily.       Follow-up Information    Follow up with [facility], MD. Schedule an appointment as soon as possible for a visit in 2 weeks.   Specialty:  General Surgery   Contact information:   [address] [place] [address] [phone]       Follow up with FRIED, ROBERT L, MD. Schedule an appointment as soon as possible for a visit in 5 days.   Specialty:  Family Medicine   Why:  To be seen with repeat labs (CBC & BMP). Will need repeat chest XRay in 1-2 weeks.   Contact information:   [address] [place] [address] [phone]       Schedule an appointment as soon as possible for a visit with HAYES,JOHN C, MD.   Specialty:  Gastroenterology   Why:  As needed, If symptoms worsen   Contact information:   [address] [place] [place] [address] [phone]        The results of significant diagnostics from this hospitalization (including imaging, microbiology, ancillary and laboratory) are listed below for reference.    Significant  Diagnostic Studies: Dg Chest 1 View  07/19/2014   CLINICAL DATA:  Right pleural effusion status post thoracentesis. Weakness.  EXAM: CHEST  1 VIEW  COMPARISON:  07/18/2014  FINDINGS: Cardiac silhouette remains mildly enlarged. Thoracic aortic calcification is again seen. There is a small right pleural effusion, decreased in size following interval thoracentesis. There is improved aeration of the right mid and lower lung, although persistent parenchymal opacity remains. Pulmonary vascular congestion is similar to the prior study. No pneumothorax is identified. Degenerative changes are noted at the left shoulder.  IMPRESSION: 1. Decreased size of right pleural effusion following thoracentesis. No pneumothorax 2. Improved right lung aeration.   Electronically Signed   By: Logan Bores   On: 07/19/2014 10:41   Dg Chest 2 View  07/15/2014   CLINICAL DATA:  81 year old female with shortness of breath.  EXAM: CHEST - 2 VIEW  COMPARISON:  Chest x-ray 07/14/2014, 05/13/2014, CT 07/14/2014  FINDINGS: Cardiomediastinal silhouette likely unchanged though partially obscured by overlying lung/pleural disease.  Atherosclerotic calcifications of the aortic arch.  Improved aeration at the superior right lung.  No pneumothorax.  Persisting interstitial and airspace opacities at the right mid and lower lung with obscuration of the right hemidiaphragm. Fluid within the minor fissure. Partial obscuration left hemidiaphragm.  Osteopenia with degenerative changes.  No displaced fracture.  Vascular calcifications of the abdominal aorta.  IMPRESSION: Persisting right-sided pleural effusion with associated atelectatic changes, and improved aeration at the superior right lung. Airspace opacities on the right base may reflect  atelectasis and/ consolidation.  Left lung is relatively well aerated.  Atherosclerosis.  Signed,  Dulcy Fanny. Earleen Newport, DO  [facility]  [facility]   Electronically Signed    By: Corrie Mckusick D.O.   On: 07/15/2014 07:06   Dg Chest 2 View  07/14/2014   CLINICAL DATA:  Shortness of breath and decreased oxygen saturation.  EXAM: CHEST - 2 VIEW  COMPARISON:  05/13/2014  FINDINGS: There is increase in a moderate right pleural effusion with associated atelectasis of the right lung. Mild interstitial edema present. The heart size is stable. No pneumothorax. The visualized skeletal structures are unremarkable.  IMPRESSION: Increase in moderate right pleural effusion with associated atelectasis of the right lung. Mild interstitial edema.   Electronically Signed   By: Aletta Edouard M.D.   On: 07/14/2014 10:12   Ct Chest Wo Contrast  07/14/2014   CLINICAL DATA:  81 year old female with right-sided pleural effusion.  EXAM: CT CHEST WITHOUT CONTRAST  TECHNIQUE: Multidetector CT imaging of the chest was performed following the standard protocol without IV contrast.  COMPARISON:  Multiple prior chest x-ray.  FINDINGS: Chest:  Unremarkable appearance of the superficial soft tissues of the chest wall.  No evidence of axillary adenopathy. No supraclavicular adenopathy. Unremarkable appearance of the thyroid.  Unremarkable appearance of the thoracic inlet.  Heart size borderline enlarged. Trace pericardial fluid/ thickening.  Calcifications of the left main, left anterior descending, circumflex, right coronary arteries.  Multiple mediastinal lymph nodes, none of these are enlarged by CT size criteria. Index lymph node in the subcarinal nodal station measures 8 mm in greatest short axial dimension.  The esophagus is gas-filled superiorly. Small hiatal hernia distally.  Calcifications of the thoracic aorta. No evidence of aneurysm or periaortic fluid. Maximum diameter of the AA ascending aorta measures 3.9 cm. Calcifications of the branch vessels and descending thoracic aorta.  Moderate right-sided pleural effusion. Small left-sided pleural effusion. There is associated atelectatic changes of the  bilateral lungs.  No significant interlobular septal thickening. Trace fluid within the right minor fissure an within the right major fissure.  Linear and ground-glass opacities at the bilateral lung bases.  Airways are patent without evidence of debris within the airways.  Unremarkable appearance of the upper abdomen.  Partial visualization of left abdominal hernia, present on the comparison CT dated 09/04/2008.  Musculoskeletal:  No displaced fracture.  Sclerotic appearance of the vertebral bodies, may be related to renal dysfunction. Multilevel degenerative changes of the thoracic spine.  IMPRESSION: Moderate right-sided pleural effusion with small left-sided pleural effusion. Associated compressive atelectasis. Etiology is uncertain, as there is no evidence of overt pulmonary edema, and there is no evidence of lobar pneumonia. Mixed linear and ground-glass opacities of the bilateral lungs favored to represent atelectasis/ scarring.  Atherosclerosis with left main and 3 vessel coronary artery disease.  Additional findings as above.  Signed,  Dulcy Fanny. Earleen Newport, DO  [facility]  [facility]   Electronically Signed   By: Corrie Mckusick D.O.   On: 07/14/2014 16:43   US Renal  07/23/2014   CLINICAL DATA:  Elevated creatinine. History of hypertension, diabetes, chronic kidney disease stage 4.  EXAM: RENAL / URINARY TRACT ULTRASOUND COMPLETE  COMPARISON:  05/09/2014 and previous CT exam is 07/14/2014, 10/14/2009  FINDINGS: Right Kidney:  Length: 5.5 cm. Renal parenchymal thinning. Echogenic renal parenchyma. No focal mass or hydronephrosis.  Left Kidney:  Length: 8.7 cm. Renal parenchymal thinning. Echogenic renal parenchyma. No hydronephrosis.  Upper pole cyst is 1.6 x 1.1 x 1.1 cm and contains internal echoes.  Bladder:  Appears normal for degree of bladder distention.  Bilateral pleural effusions are present.  IMPRESSION: 1. Bilateral renal parenchymal thinning and  echogenic renal parenchyma. 2. Small hypoechoic lesion within the upper pole of the left kidney is consistent with a benign cyst when compared with prior studies. 3. No hydronephrosis.   Electronically Signed   By: Nolon Nations M.D.   On: 07/23/2014 16:42   Dg Chest Port 1 View  07/24/2014   CLINICAL DATA:  Right-sided pleural effusion. CHF. Hypertension. Diabetes.  EXAM: PORTABLE CHEST - 1 VIEW  COMPARISON:  07/22/2014  FINDINGS: Midline trachea. Cardiomegaly accentuated by AP portable technique. Small right pleural effusion is similar, given differences in technique. Suspect trace left pleural fluid. No pneumothorax. The Chin overlies the right apex. Persistent right base airspace disease. Mild pulmonary venous congestion, superimposed upon low lung volumes.  IMPRESSION: Similar right pleural effusion and adjacent airspace disease.  Cardiomegaly and low lung volumes with mild pulmonary venous congestion.   Electronically Signed   By: Abigail Miyamoto M.D.   On: 07/24/2014 14:41   Dg Chest Port 1 View  07/22/2014   CLINICAL DATA:  Right-sided pleural effusion. History of moderate aortic stenosis and atrial fibrillation  EXAM: PORTABLE CHEST - 1 VIEW  COMPARISON:  07/19/2014; 07/18/2014; 07/14/2014; chest CT - 07/14/2014  FINDINGS: Grossly unchanged cardiac silhouette and mediastinal contours with atherosclerotic plaque within the thoracic aorta. Interval increase in small right-sided pleural effusion with associated worsening right mid and lower lung heterogeneous / consolidative opacities. Unchanged trace left-sided effusion with associated left basilar heterogeneous opacities. No pneumothorax. Unchanged bones.  IMPRESSION: 1. Interval increase in small right-sided effusion with associated worsening right mid and lower lung opacities, atelectasis versus infiltrate. 2. Unchanged trace left-sided effusion with associated left basilar/retrocardiac opacities   Electronically Signed   By: Sandi Mariscal M.D.   On:  07/22/2014 08:05   Dg Chest Portable 1 View  07/18/2014   CLINICAL DATA:  81 year old female with pleural effusion. Subsequent encounter.  EXAM: PORTABLE CHEST - 1 VIEW  COMPARISON:  Several prior exams most recent 07/17/2014 chest x-ray.  FINDINGS: Persistent consolidation right mid to lower lung zone may represent pleural fluid with adjacent atelectasis. Cannot exclude underlying infiltrate or mass by plain film examination. Please see recent CT report.  Asymmetric pulmonary vascular congestion greater on the right.  No gross pneumothorax.  Cardiomegaly.  Calcified tortuous aorta.  Shoulder joint degenerative change greater on left.  IMPRESSION: Similar appearance of asymmetric airspace disease greater on the right as detailed above.   Electronically Signed   By: Genia Del M.D.   On: 07/18/2014 08:16   Dg Chest Port 1 View  07/17/2014   CLINICAL DATA:  Cough and pleural effusion  EXAM: PORTABLE CHEST - 1 VIEW  COMPARISON:  PA and lateral chest of July 15, 2014  FINDINGS: There is persistent increased density in the mid and lower right hemi thorax. There is obscuration of the hemidiaphragm. The pulmonary interstitial markings on the right are increased as compared to the earlier study. The left lung is well-expanded. There is minimal density at the left lung base consistent with subsegmental atelectasis. The cardiac silhouette is enlarged. There is mild cephalization of the vascular pattern. The bony structures exhibit no acute abnormalities.  IMPRESSION: 1. Worsening appearance of airspace opacities in the right mid and lower lung consistent with atelectasis or pneumonia. There is associated  pleural fluid on the right. There is subsegmental atelectasis at the left lung base. 2. Low-grade CHF with mild pulmonary interstitial edema.   Electronically Signed   By: David  Martinique M.D.   On: 07/17/2014 07:44   Dg Ercp  07/21/2014   CLINICAL DATA:  Bile duct obstruction  EXAM: ERCP  TECHNIQUE: Multiple spot  images obtained with the fluoroscopic device and submitted for interpretation post-procedure.  FLUOROSCOPY TIME:  5 minutes, 12 seconds  COMPARISON:  Percutaneous cholangiogram very a existing cholecystostomy tube - 07/19/2014; ERCP - 07/11/2014  FINDINGS: 23 spot intraoperative fluoroscopic images of the right upper abdominal quadrant during ERCP are provided for review.  Initial image demonstrates a cholecystostomy tube overlying the expected location of the gallbladder fossa. An ERCP probe overlies the right upper abdominal quadrant with selective cannulation opacification of the distal aspect of the CBD which appears moderately dilated.  Subsequent images demonstrate insufflation of a balloon within the caudal aspect of the CBD with presumably several trials of biliary sweeping and sphincterotomy.  There is minimal opacification of the cystic duct and neck of the gallbladder.  Presumed delayed images demonstrate stasis of contrast within the CBD.  IMPRESSION: ERCP with biliary sweeping and presumed sphincterotomy as above.  These images were submitted for radiologic interpretation only. Please see the procedural report for the amount of contrast and the fluoroscopy time utilized.   Electronically Signed   By: Sandi Mariscal M.D.   On: 07/21/2014 13:46   Dg Ercp Biliary & Pancreatic Ducts  07/11/2014   CLINICAL DATA:  Biliary stone  EXAM: ERCP  TECHNIQUE: Multiple spot images obtained with the fluoroscopic device and submitted for interpretation post-procedure.  : COMPARISON:  Cholangiography 06/13/2014  FINDINGS: Two intraprocedural images demonstrate retrograde cholangiography showing a persistent ovoid stone in the dilated distal common bile duct. A percutaneous cholecystostomy tube is noted. Contrast increased around the percutaneous cholecystostomy tube consistent with cystic duct patency. Fluoroscopy of a balloon sweep is documented.  IMPRESSION: 1. Choledocholithiasis and stone manipulation. 2. Patent  cystic duct.  These images were submitted for radiologic interpretation only. Please see the procedural report for the amount of contrast and the fluoroscopy time utilized.   Electronically Signed   By: Monte Fantasia M.D.   On: 07/11/2014 21:37   Ir Cholangiogram Existing Tube  07/24/2014   CLINICAL DATA:  Cholecystostomy tube  EXAM: CHOLANGIOGRAM VIA EXISTING CATHETER  PROCEDURE: Contrast was injected into the cholecystostomy tube. It was cut and removed without complication.  FINDINGS: Cystic and common bile ducts are patent.  IMPRESSION: Cystic and common bile ducts are patent. The cholecystostomy tube was removed.   Electronically Signed   By: Marybelle Killings M.D.   On: 07/24/2014 14:32   Ir Cholangiogram Existing Tube  07/19/2014   CLINICAL DATA:  Percutaneous transhepatic cholecystostomy, status post ERCP stone removal 07/11/2014.  EXAM: CHOLANGIOGRAM VIA EXISTING CATHETER  COMPARISON:  07/11/2014  FINDINGS: Contrast injection performed of the right upper quadrant cholecystostomy. Residual small filling defects in the gallbladder compatible with cholelithiasis. Cystic duct and common hepatic duct are patent. Contrast refluxes into the biliary radicles. The proximal CBD fills with contrast but despite adequate biliary distension contrast does not pass into the duodenum. Findings compatible with distal CBD obstruction possibly related to inflammation or the recent intervention for stone removal. System was decompressed by syringe aspiration.  IMPRESSION: Cholangiogram through existing cholecystostomy demonstrates distal CBD obstruction. Contrast does not pass into the duodenum.  PLAN: Keep cholecystostomy gravity drainage. Consider repeat injection  in 1 week. Follow-up with Dr. Deatra Ina.  These results were called by telephone at the time of interpretation on 07/19/2014 at 9:51 am to Dr. Ferdinand Cava, [facility] , who verbally acknowledged these results.   Electronically Signed   By: Jerilynn Mages.  Shick M.D.   On: 07/19/2014  09:52   US Thoracentesis Asp Pleural Space W/img Guide  07/19/2014   CLINICAL DATA:  Right-sided pleural effusion. Request diagnostic and therapeutic thoracentesis.  EXAM: ULTRASOUND GUIDED RIGHT THORACENTESIS  COMPARISON:  None.  PROCEDURE: An ultrasound guided thoracentesis was thoroughly discussed with the patient and questions answered. The benefits, risks, alternatives and complications were also discussed. The patient understands and wishes to proceed with the procedure. Written consent was obtained.  Ultrasound demonstrates multiloculated effusion on the right. There are numerous filmy web-like septations, however adequate fluid for thoracentesis.  Ultrasound was performed to localize and mark an adequate pocket of fluid in the right chest. The area was then prepped and draped in the normal sterile fashion. 1% Lidocaine was used for local anesthesia. Under ultrasound guidance a 19 gauge Yueh catheter was introduced. Thoracentesis was performed. The catheter was removed and a dressing applied.  COMPLICATIONS: None immediate.  FINDINGS: A total of approximately 680 mL of clear yellow fluid was removed. A fluid sample wassent for laboratory analysis.  IMPRESSION: Successful ultrasound guided right thoracentesis yielding 680 mL of pleural fluid.  Read by: Ascencion Dike PA-C   Electronically Signed   By: Jerilynn Mages.  Shick M.D.   On: 07/19/2014 10:22    Microbiology: Recent Results (from the past 240 hour(s))  Body fluid culture     Status: None   Collection Time: 07/19/14  9:59 AM  Result Value Ref Range Status   Specimen Description FLUID PLEURAL RIGHT  Final   Special Requests NONE  Final   Gram Stain   Final    NO WBC SEEN NO ORGANISMS SEEN Performed at [facility]    Culture   Final    NO GROWTH 3 DAYS Performed at [facility]    Report Status 07/23/2014 FINAL  Final     Labs: Basic Metabolic Panel:  Recent Labs Lab 07/20/14 0356 07/21/14 0416 07/22/14 0353  07/23/14 0620 07/24/14 0420  NA 138 138 138 136 138  K 4.0 3.7 4.9 4.2 4.4  CL 100 100 101 101 103  CO2 27 25 25 24 24   GLUCOSE 129* 95 76 118* 93  BUN 46* 47* 50* 46* 44*  CREATININE 2.84* 3.10* 3.23* 3.46* 3.00*  CALCIUM 8.3* 8.5 8.3* 8.1* 8.3*   Liver Function Tests:  Recent Labs Lab 07/20/14 0356 07/21/14 0416 07/22/14 0353 07/23/14 0620 07/24/14 0420  AST 18 16 31 19 17   ALT 8 8 10  10* 9*  ALKPHOS 119* 110 111 116 106  BILITOT 0.3 0.4 1.0 0.6 0.3  PROT 5.4* 5.4* 5.4* 5.4* 5.3*  ALBUMIN 2.2* 2.3* 2.2* 2.2* 2.3*   No results for input(s): LIPASE, AMYLASE in the last 168 hours. No results for input(s): AMMONIA in the last 168 hours. CBC:  Recent Labs Lab 07/20/14 0356 07/21/14 0416 07/22/14 0353 07/23/14 0620 07/24/14 0420  WBC 8.6 8.4 9.5 7.9 6.8  HGB 8.8* 8.6* 8.7* 8.6* 8.9*  HCT 28.6* 27.9* 28.3* 28.1* 28.4*  MCV 91.4 91.2 92.2 92.1 91.6  PLT 188 186 208 158 156   Cardiac Enzymes: No results for input(s): CKTOTAL, CKMB, CKMBINDEX, TROPONINI in the last 168 hours. BNP: BNP (last 3 results)  Recent Labs  03/15/14  0258 07/14/14 1125  BNP 941.4* 526.1*    ProBNP (last 3 results) No results for input(s): PROBNP in the last 8760 hours.  CBG:  Recent Labs Lab 07/23/14 1626 07/23/14 2125 07/24/14 0557 07/24/14 1203 07/24/14 1643  GLUCAP 176* 132* 112* 143* 159*     Additional labs: 1. Anemia panel: Iron 19, TIBC 183, saturation ratios 10, ferritin 618, transferrin 144 and folate 8.7 & vitamin B-12: 1627 2. Pleural fluid culture from 4/27 and 4/22: Negative 3. Cytology of pleural fluid 07/19/14: Diagnosis PLEURAL FLUID, RIGHT (SPECIMEN 1 OF 1 COLLECTED 07-19-2014): NO MALIGNANT CELLS IDENTIFIED. ACUTE AND CHRONIC INFLAMMATION PRESENT. 4. Cytology of pleural fluid 07/14/14: Diagnosis PLEURAL FLUID, RIGHT (SPECIMEN 1 OF 1, COLLECTED ON 07/14/14): NO MALIGNANT CELLS IDENTIFIED. ACUTE INFLAMMATION AND REACTIVE MESOTHELIAL  CELLS..   SignedVernell Leep, MD, FACP, FHM. [facility] Pager [phone]  If 7PM-7AM, please contact night-coverage [email or web address] Password [facility] 07/24/2014, 5:48 PM

## 2014-07-24 NOTE — Discharge Instructions (Signed)
Acute Respiratory Failure °Respiratory failure is when your lungs are not working well and your breathing (respiratory) system fails. When respiratory failure occurs, it is difficult for your lungs to get enough oxygen, get rid of carbon dioxide, or both. Respiratory failure can be life threatening.  °Respiratory failure can be acute or chronic. Acute respiratory failure is sudden, severe, and requires emergency medical treatment. Chronic respiratory failure is less severe, happens over time, and requires ongoing treatment.  °WHAT ARE THE CAUSES OF ACUTE RESPIRATORY FAILURE?  °Any problem affecting the heart or lungs can cause acute respiratory failure. Some of these causes include the following: °· Chronic bronchitis and emphysema (COPD).   °· Blood clot going to a lung (pulmonary embolism).   °· Having water in the lungs caused by heart failure, lung injury, or infection (pulmonary edema).   °· Collapsed lung (pneumothorax).   °· Pneumonia.   °· Pulmonary fibrosis.   °· Obesity.   °· Asthma.   °· Heart failure.   °· Any type of trauma to the chest that can make breathing difficult.   °· Nerve or muscle diseases making chest movements difficult. °WHAT SYMPTOMS SHOULD YOU WATCH FOR?  °If you have any of these signs or symptoms, you should seek immediate medical care:  °· You have shortness of breath (dyspnea) with or without activity.   °· You have rapid, fast breathing (tachypnea).   °· You are wheezing. °· You are unable to say more than a few words without having to catch your breath. °· You find it very difficult to function normally. °· You have a fast heart rate.   °· You have a bluish color to your finger or toe nail beds.   °· You have confusion or drowsiness or both.   °HOW WILL MY ACUTE RESPIRATORY FAILURE BE TREATED?  °Treatment of acute respiratory failure depends on the cause of the respiratory failure. Usually, you will stay in the intensive care unit so your breathing can be watched closely. Treatment  can include the following: °· Oxygen. Oxygen can be delivered through the following: °· Nasal cannula. This is small tubing that goes in your nose to give you oxygen. °· Face mask. A face mask covers your nose and mouth to give you oxygen. °· Medicine. Different medicines can be given to help with breathing. These can include: °· Nebulizers. Nebulizers deliver medicines to open the air passages (bronchodilators). These medicines help to open or relax the airways in the lungs so you can breathe better. They can also help loosen mucus from your lungs. °· Diuretics. Diuretic medicines can help you breathe better by getting rid of extra water in your body. °· Steroids. Steroid medicines can help decrease swelling (inflammation) in your lungs. °· Antibiotics. °· Chest tube. If you have a collapsed lung (pneumothorax), a chest tube is placed to help reinflate the lung. °· Non-invasive positive pressure ventilation (NPPV). This is a tight-fitting mask that goes over your nose and mouth. The mask has tubing that is attached to a machine. The machine blows air into the tubing, which helps to keep the tiny air sacs (alveoli) in your lungs open. This machine allows you to breathe on your own. °· Ventilator. A ventilator is a breathing machine. When on a ventilator, a breathing tube is put into the lungs. A ventilator is used when you can no longer breathe well enough on your own. You may have low oxygen levels or high carbon dioxide (CO2) levels in your blood. When you are on a ventilator, sedation and pain medicines are given to make you sleep   so your lungs can heal. °Document Released: 03/15/2013 Document Revised: 07/25/2013 Document Reviewed: 03/15/2013 °Exit[employer]® Patient Information ©2015 [employer]. This information is not intended to replace advice given to you by your health care provider. Make sure you discuss any questions you have with your health care provider. °Heart Failure °Heart failure is a condition in  which the heart has trouble pumping blood. This means your heart does not pump blood efficiently for your body to work well. In some cases of heart failure, fluid may back up into your lungs or you may have swelling (edema) in your lower legs. Heart failure is usually a long-term (chronic) condition. It is important for you to take good care of yourself and follow your health care provider's treatment plan. °CAUSES  °Some health conditions can cause heart failure. Those health conditions include: °· High blood pressure (hypertension). Hypertension causes the heart muscle to work harder than normal. When pressure in the blood vessels is high, the heart needs to pump (contract) with more force in order to circulate blood throughout the body. High blood pressure eventually causes the heart to become stiff and weak. °· Coronary artery disease (CAD). CAD is the buildup of cholesterol and fat (plaque) in the arteries of the heart. The blockage in the arteries deprives the heart muscle of oxygen and blood. This can cause chest pain and may lead to a heart attack. High blood pressure can also contribute to CAD. °· Heart attack (myocardial infarction). A heart attack occurs when one or more arteries in the heart become blocked. The loss of oxygen damages the muscle tissue of the heart. When this happens, part of the heart muscle dies. The injured tissue does not contract as well and weakens the heart's ability to pump blood. °· Abnormal heart valves. When the heart valves do not open and close properly, it can cause heart failure. This makes the heart muscle pump harder to keep the blood flowing. °· Heart muscle disease (cardiomyopathy or myocarditis). Heart muscle disease is damage to the heart muscle from a variety of causes. These can include drug or alcohol abuse, infections, or unknown reasons. These can increase the risk of heart failure. °· Lung disease. Lung disease makes the heart work harder because the lungs do  not work properly. This can cause a strain on the heart, leading it to fail. °· Diabetes. Diabetes increases the risk of heart failure. High blood sugar contributes to high fat (lipid) levels in the blood. Diabetes can also cause slow damage to tiny blood vessels that carry important nutrients to the heart muscle. When the heart does not get enough oxygen and food, it can cause the heart to become weak and stiff. This leads to a heart that does not contract efficiently. °· Other conditions can contribute to heart failure. These include abnormal heart rhythms, thyroid problems, and low blood counts (anemia). °Certain unhealthy behaviors can increase the risk of heart failure, including: °· Being overweight. °· Smoking or chewing tobacco. °· Eating foods high in fat and cholesterol. °· Abusing illicit drugs or alcohol. °· Lacking physical activity. °SYMPTOMS  °Heart failure symptoms may vary and can be hard to detect. Symptoms may include: °· Shortness of breath with activity, such as climbing stairs. °· Persistent cough. °· Swelling of the feet, ankles, legs, or abdomen. °· Unexplained weight gain. °· Difficulty breathing when lying flat (orthopnea). °· Waking from sleep because of the need to sit up and get more air. °· Rapid heartbeat. °· Fatigue and   loss of energy. °· Feeling light-headed, dizzy, or close to fainting. °· Loss of appetite. °· Nausea. °· Increased urination during the night (nocturia). °DIAGNOSIS  °A diagnosis of heart failure is based on your history, symptoms, physical examination, and diagnostic tests. Diagnostic tests for heart failure may include: °· Echocardiography. °· Electrocardiography. °· Chest X-ray. °· Blood tests. °· Exercise stress test. °· Cardiac angiography. °· Radionuclide scans. °TREATMENT  °Treatment is aimed at managing the symptoms of heart failure. Medicines, behavioral changes, or surgical intervention may be necessary to treat heart failure. °· Medicines to help treat heart  failure may include: °¨ Angiotensin-converting enzyme (ACE) inhibitors. This type of medicine blocks the effects of a blood protein called angiotensin-converting enzyme. ACE inhibitors relax (dilate) the blood vessels and help lower blood pressure. °¨ Angiotensin receptor blockers (ARBs). This type of medicine blocks the actions of a blood protein called angiotensin. Angiotensin receptor blockers dilate the blood vessels and help lower blood pressure. °¨ Water pills (diuretics). Diuretics cause the kidneys to remove salt and water from the blood. The extra fluid is removed through urination. This loss of extra fluid lowers the volume of blood the heart pumps. °¨ Beta blockers. These prevent the heart from beating too fast and improve heart muscle strength. °¨ Digitalis. This increases the force of the heartbeat. °· Healthy behavior changes include: °¨ Obtaining and maintaining a healthy weight. °¨ Stopping smoking or chewing tobacco. °¨ Eating heart-healthy foods. °¨ Limiting or avoiding alcohol. °¨ Stopping illicit drug use. °¨ Physical activity as directed by your health care provider. °· Surgical treatment for heart failure may include: °¨ A procedure to open blocked arteries, repair damaged heart valves, or remove damaged heart muscle tissue. °¨ A pacemaker to improve heart muscle function and control certain abnormal heart rhythms. °¨ An internal cardioverter defibrillator to treat certain serious abnormal heart rhythms. °¨ A left ventricular assist device (LVAD) to assist the pumping ability of the heart. °HOME CARE INSTRUCTIONS  °· Take medicines only as directed by your health care provider. Medicines are important in reducing the workload of your heart, slowing the progression of heart failure, and improving your symptoms. °¨ Do not stop taking your medicine unless directed by your health care provider. °¨ Do not skip any dose of medicine. °¨ Refill your prescriptions before you run out of medicine. Your  medicines are needed every day. °· Engage in moderate physical activity if directed by your health care provider. Moderate physical activity can benefit some people. The elderly and people with severe heart failure should consult with a health care provider for physical activity recommendations. °· Eat heart-healthy foods. Food choices should be free of trans fat and low in saturated fat, cholesterol, and salt (sodium). Healthy choices include fresh or frozen fruits and vegetables, fish, lean meats, legumes, fat-free or low-fat dairy products, and whole grain or high fiber foods. Talk to a dietitian to learn more about heart-healthy foods. °· Limit sodium if directed by your health care provider. Sodium restriction may reduce symptoms of heart failure in some people. Talk to a dietitian to learn more about heart-healthy seasonings. °· Use healthy cooking methods. Healthy cooking methods include roasting, grilling, broiling, baking, poaching, steaming, or stir-frying. Talk to a dietitian to learn more about healthy cooking methods. °· Limit fluids if directed by your health care provider. Fluid restriction may reduce symptoms of heart failure in some people. °· Weigh yourself every day. Daily weights are important in the early recognition of excess fluid. You   should weigh yourself every morning after you urinate and before you eat breakfast. Wear the same amount of clothing each time you weigh yourself. Record your daily weight. Provide your health care provider with your weight record. °· Monitor and record your blood pressure if directed by your health care provider. °· Check your pulse if directed by your health care provider. °· Lose weight if directed by your health care provider. Weight loss may reduce symptoms of heart failure in some people. °· Stop smoking or chewing tobacco. Nicotine makes your heart work harder by causing your blood vessels to constrict. Do not use nicotine gum or patches before talking to  your health care provider. °· Keep all follow-up visits as directed by your health care provider. This is important. °· Limit alcohol intake to no more than 1 drink per day for nonpregnant women and 2 drinks per day for men. One drink equals 12 ounces of beer, 5 ounces of wine, or 1½ ounces of hard liquor. Drinking more than that is harmful to your heart. Tell your health care provider if you drink alcohol several times a week. Talk with your health care provider about whether alcohol is safe for you. If your heart has already been damaged by alcohol or you have severe heart failure, drinking alcohol should be stopped completely. °· Stop illicit drug use. °· Stay up-to-date with immunizations. It is especially important to prevent respiratory infections through current pneumococcal and influenza immunizations. °· Manage other health conditions such as hypertension, diabetes, thyroid disease, or abnormal heart rhythms as directed by your health care provider. °· Learn to manage stress. °· Plan rest periods when fatigued. °· Learn strategies to manage high temperatures. If the weather is extremely hot: °¨ Avoid vigorous physical activity. °¨ Use air conditioning or fans or seek a cooler location. °¨ Avoid caffeine and alcohol. °¨ Wear loose-fitting, lightweight, and light-colored clothing. °· Learn strategies to manage cold temperatures. If the weather is extremely cold: °¨ Avoid vigorous physical activity. °¨ Layer clothes. °¨ Wear mittens or gloves, a hat, and a scarf when going outside. °¨ Avoid alcohol. °· Obtain ongoing education and support as needed. °· Participate in or seek rehabilitation as needed to maintain or improve independence and quality of life. °SEEK MEDICAL CARE IF:  °· Your weight increases by 03 lb/1.4 kg in 1 day or 05 lb/2.3 kg in a week. °· You have increasing shortness of breath that is unusual for you. °· You are unable to participate in your usual physical activities. °· You tire  easily. °· You cough more than normal, especially with physical activity. °· You have any or more swelling in areas such as your hands, feet, ankles, or abdomen. °· You are unable to sleep because it is hard to breathe. °· You feel like your heart is beating fast (palpitations). °· You become dizzy or light-headed upon standing up. °SEEK IMMEDIATE MEDICAL CARE IF:  °· You have difficulty breathing. °· There is a change in mental status such as decreased alertness or difficulty with concentration. °· You have a pain or discomfort in your chest. °· You have an episode of fainting (syncope). °MAKE SURE YOU:  °· Understand these instructions. °· Will watch your condition. °· Will get help right away if you are not doing well or get worse. °Document Released: 03/10/2005 Document Revised: 07/25/2013 Document Reviewed: 04/09/2012 °[employer]® Patient Information ©2015 [employer]. This information is not intended to replace advice given to you by your health care provider.   Make sure you discuss any questions you have with your health care provider. ° °

## 2014-07-24 NOTE — Progress Notes (Signed)
Daily Rounding Note  07/24/2014, 8:59 AM  LOS: 10 days   SUBJECTIVE:       Eating 56 to 100% of meals, no RUQ pain.  Perc biliary tube pulled this AM. Feels stronger  OBJECTIVE:         Vital signs in last 24 hours:    Temp:  [98.2 F (36.8 C)-98.3 F (36.8 C)] 98.2 F (36.8 C) (05/02 0635) Pulse Rate:  [66-72] 72 (05/02 0635) Resp:  [18-20] 18 (05/02 0635) BP: (108-110)/(52-64) 110/64 mmHg (05/02 0635) SpO2:  [97 %-98 %] 98 % (05/02 0635) Weight:  [164 lb 3.5 oz (74.489 kg)] 164 lb 3.5 oz (74.489 kg) (05/02 0635) Last BM Date: 07/24/14 Filed Weights   07/21/14 0605 07/23/14 0550 07/24/14 0160  Weight: 163 lb 3.2 oz (74.027 kg) 164 lb 6.4 oz (74.571 kg) 164 lb 3.5 oz (74.489 kg)   General: looks better, less frail.  comfortable   Heart: Irreg, irreg.  Chest: no BS in right base.  Coughed up yellow sputum.  Abdomen: soft, NT, bandage over site of pulled drain was not removed to look.   Extremities: no CCE Neuro/Psych:  Pleasant, alert, oriented.  Engaged, no confusion.  Moves all 4s.   Intake/Output from previous day: 05/01 0701 - 05/02 0700 In: 30 [P.O.:780] Out: 700 [Urine:700]  Intake/Output this shift:    Lab Results:  Recent Labs  07/22/14 0353 07/23/14 0620 07/24/14 0420  WBC 9.5 7.9 6.8  HGB 8.7* 8.6* 8.9*  HCT 28.3* 28.1* 28.4*  PLT 208 158 156   BMET  Recent Labs  07/22/14 0353 07/23/14 0620 07/24/14 0420  NA 138 136 138  K 4.9 4.2 4.4  CL 101 101 103  CO2 25 24 24   GLUCOSE 76 118* 93  BUN 50* 46* 44*  CREATININE 3.23* 3.46* 3.00*  CALCIUM 8.3* 8.1* 8.3*   LFT  Recent Labs  07/22/14 0353 07/23/14 0620 07/24/14 0420  PROT 5.4* 5.4* 5.3*  ALBUMIN 2.2* 2.2* 2.3*  AST 31 19 17   ALT 10 10* 9*  ALKPHOS 111 116 106  BILITOT 1.0 0.6 0.3   PT/INR No results for input(s): LABPROT, INR in the last 72 hours. Hepatitis Panel No results for input(s): HEPBSAG, HCVAB, HEPAIGM,  HEPBIGM in the last 72 hours.  Studies/Results: US Renal  07/23/2014   CLINICAL DATA:  Elevated creatinine. History of hypertension, diabetes, chronic kidney disease stage 4.  EXAM: RENAL / URINARY TRACT ULTRASOUND COMPLETE  COMPARISON:  05/09/2014 and previous CT exam is 07/14/2014, 10/14/2009  FINDINGS: Right Kidney:  Length: 5.5 cm. Renal parenchymal thinning. Echogenic renal parenchyma. No focal mass or hydronephrosis.  Left Kidney:  Length: 8.7 cm. Renal parenchymal thinning. Echogenic renal parenchyma. No hydronephrosis. Upper pole cyst is 1.6 x 1.1 x 1.1 cm and contains internal echoes.  Bladder:  Appears normal for degree of bladder distention.  Bilateral pleural effusions are present.  IMPRESSION: 1. Bilateral renal parenchymal thinning and echogenic renal parenchyma. 2. Small hypoechoic lesion within the upper pole of the left kidney is consistent with a benign cyst when compared with prior studies. 3. No hydronephrosis.   Electronically Signed   By: Nolon Nations M.D.   On: 07/23/2014 16:42   Scheduled Meds: . aspirin EC  81 mg Oral Daily  . atorvastatin  40 mg Oral q1800  . calcitRIOL  0.25 mcg Oral Daily  . carvedilol  3.125 mg Oral BID WC  . docusate sodium  100  mg Oral BID  . ferrous sulfate  325 mg Oral BID WC  . insulin aspart  0-9 Units Subcutaneous TID WC  . LORazepam  0.5 mg Oral BID  . mirtazapine  7.5 mg Oral QHS  . polyethylene glycol  17 g Oral Daily  . saccharomyces boulardii  250 mg Oral BID  . sodium chloride  3 mL Intravenous Q12H  . sodium chloride  3 mL Intravenous Q12H  . vitamin B-12  1,000 mcg Oral Daily   Continuous Infusions:  PRN Meds:.sodium chloride, acetaminophen **OR** acetaminophen, clotrimazole-betamethasone, docusate sodium, HYDROcodone-acetaminophen, meclizine, MUSCLE RUB, ondansetron **OR** ondansetron (ZOFRAN) IV, promethazine, sodium chloride  ASSESMENT:   * Hx acute cholecystitis. Treated with perc biliary drain 05/11/14. Initially  thought to be acalculous but developed choledocholithiasis requiring ERCP/Sphinct and stone removal 4/19.  4/25 but today's cholangiogram raising ? Of recurrent CBD obstruction and stones.  S/p repeat ERCP 4/29: excellent emptying from CBD and minimal fragments of stones on balloon sweep.   LFTs consistently normal for 4 days. Post op biliary drainage down to 50 ml/24 hours and tube clamped 5/1.  Cholangiogram via tube 5/2: no report but tube removed.   * Right pleural effusion. S/p thoracentesis x 2 this admission, latest on 4/27. Levaquin for PNA now discontinued.  cxr pending.    * Diastolic CHF. Admissions for CAP and HCAP in 02/2014.   * Chronic A fib, not on AC due to risk of falls.   * Rectal cancer and LAR/ostomy in 1980s. Recurrent cancer with right colectomy 2002.   * CKD. Stage 4.     PLAN   *  Awaiting completion of CXR to determine if further therapy/intervention needed  *  From GI perspective, ok to discharge.  Will sign off.  Dr Deatra Ina available for biliary issues if needed.  Incidentally, Dr Teena Irani has previously been her GI doc for routine colon/egd's. Referred to Dr Deatra Ina due to needing urgent biliary consultation.     [name]  07/24/2014, 8:59 AM Pager: [phone]   ________________________________________________________________________  Velora Heckler GI MD note:  I personally examined the patient, reviewed the data and agree with the assessment and plan described above.  Please call or page with any further questions or concerns.   Owens Loffler, MD [facility] Pager [phone]

## 2014-08-08 ENCOUNTER — Encounter: Payer: Self-pay | Admitting: Adult Health

## 2014-08-08 ENCOUNTER — Ambulatory Visit (INDEPENDENT_AMBULATORY_CARE_PROVIDER_SITE_OTHER)
Admission: RE | Admit: 2014-08-08 | Discharge: 2014-08-08 | Disposition: A | Discharge status: Home / Self Care | Payer: Medicare Other | Source: Ambulatory Visit | Attending: Adult Health | Admitting: Adult Health

## 2014-08-08 ENCOUNTER — Ambulatory Visit (INDEPENDENT_AMBULATORY_CARE_PROVIDER_SITE_OTHER): Payer: Medicare Other | Admitting: Adult Health

## 2014-08-08 VITALS — BP 138/60 | HR 64 | Temp 97.3°F | Ht 64.0 in | Wt 170.0 lb

## 2014-08-08 DIAGNOSIS — J189 Pneumonia, unspecified organism: Secondary | ICD-10-CM | POA: Diagnosis not present

## 2014-08-08 DIAGNOSIS — J948 Other specified pleural conditions: Secondary | ICD-10-CM

## 2014-08-08 DIAGNOSIS — J90 Pleural effusion, not elsewhere classified: Secondary | ICD-10-CM

## 2014-08-08 NOTE — Patient Instructions (Signed)
Continue on current regimen .  Follow up in 2-3 weeks with chest xray with Dr. Lake Bells  Please contact office for sooner follow up if symptoms do not improve or worsen or seek emergency care

## 2014-08-08 NOTE — Assessment & Plan Note (Signed)
Persistent right-sided exudative pleural effusion Suspected to be a parapneumonic effusion after recent gallbladder surgery Patient does have diastolic dysfunction with tendency towards fluid overload. However, she has no acute symptoms and has significant chronic kidney disease We'll continue to follow this area, today's chest x-ray does appear slightly improved, then the x-ray. She had. At discharge on May 2. We'll repeat her chest x-ray in 2 weeks . If decide if she needs a repeat thoracentesis and follow-up CT chest. Patient does have a history of colon cancer years ago. Cytology has been negative 2 for malignant cells.

## 2014-08-08 NOTE — Assessment & Plan Note (Signed)
Healthcare associated pneumonia after recent gallbladder surgery Clinically, appears to be improving Chest x-ray shows a residual right-sided effusion. We'll continue to follow with serial chest x-ray. Repeat chest x-ray on return visit May need to repeat thoracentesis and CT chest if not resolving

## 2014-08-08 NOTE — Progress Notes (Signed)
Subjective:    Patient ID: [name], female    DOB: 05-Sep-1925, 81 y.o.   MRN: [id number]  HPI 81 year old female with chronic kidney disease, chronic diastolic heart failure with pulmonary hypertension. Moderate to severe aortic stenosis, atrial fibrillation, seen for pulmonary consult during hospitalization. April 2016 for a right sided exudative pleural effusion That occurred after ERCP on April 19 for retained bile duct stones.  08/08/2014 post hospital follow-up Patient returns for a post hospital follow-up. Patient was recently admitted for abdominal pain and found to have common bile duct stone. She underwent an ERCP on April 19 and was discharged with a cystotomy tube. Patient had progressive shortness of breath and lower extremity edema. Chest x-ray showed a large right pleural effusion. She underwent a thoracentesis on April 22 with 1.5 L removed. It appeared to be exudative. No malignant cells on cytology. Cultures were negative. CT chest was done after thoracentesis showing a moderate right-sided pleural effusion. Associated compressive atelectasis. No evidence of lobar pneumonia. Mixed linear and groundglass a pacer disease of the bilateral lungs, favored to represent atelectasis versus scarring. Patient underwent a repeat thoracentesis on April 27 was 680 cc removed that appeared to be exudative but more lymphocytic. Repeat cytology was negative for malignant cells. She was treated for probable healthcare associated pneumonia with IV antibiotics. Felt to be a likely parapneumonic effusion layered to recent gallbladder issues. Since discharge. Patient says she does feel improved. She is much stronger. She denies any shortness of breath. She had a repeat chest x-ray at her primary care physician's last week that showed a persistent small to moderate right-sided pleural effusion. Chest x-ray today was reviewed independently and showed a stable right-sided pleural effusion. Patient  denies any hemoptysis, fever, orthopnea, PND or increased leg swelling. Patient has chronic kidney disease with last serum creatinine at 3 . She is on 80 mg of Lasix. . She has a good appetite and has been sleeping very well.    Review of Systems Constitutional:   No  weight loss, night sweats,  Fevers, chills, + fatigue, or  lassitude.  HEENT:   No headaches,  Difficulty swallowing,  Tooth/dental problems, or  Sore throat,                No sneezing, itching, ear ache, nasal congestion, post nasal drip,   CV:  No chest pain,  Orthopnea, PND,  anasarca, dizziness, palpitations, syncope.   GI  No heartburn, indigestion, abdominal pain, nausea, vomiting, diarrhea, change in bowel habits, loss of appetite, bloody stools.  Chronic colostomy   Resp: No shortness of breath with exertion or at rest.  No excess mucus, no productive cough,  No non-productive cough,  No coughing up of blood.  No change in color of mucus.  No wheezing.  No chest wall deformity  Skin: no rash or lesions.  GU: no dysuria, change in color of urine, no urgency or frequency.  No flank pain, no hematuria   MS:  No joint pain or swelling.  No decreased range of motion.  No back pain.  Psych:  No change in mood or affect. No depression or anxiety.  No memory loss.         Objective:   Physical Exam Family present  GEN: A/Ox3; pleasant , NAD, elderly   HEENT:  [place]/AT,  EACs-clear, TMs-wnl, NOSE-clear, THROAT-clear, no lesions, no postnasal drip or exudate noted.   NECK:  Supple w/ fair ROM; no JVD; normal carotid impulses w/o  bruits; no thyromegaly or nodules palpated; no lymphadenopathy.  RESP  Decreased BS in bases R>L , no accessory muscle use, no dullness to percussion  CARD:  RRR, no m/r/g  , 1-2 +  peripheral edema, pulses intact, no cyanosis or clubbing.  GI:   Soft & nt; nml bowel sounds; no organomegaly or masses detected.  Musco: Warm bil, no deformities or joint swelling noted.   Neuro: alert,  no focal deficits noted.    Skin: Warm, no lesions or rashes  CXR 08/08/2014  Reviewed independently Stable small-moderate right pleural effusion with basilar atelectasis.     Assessment & Plan:

## 2014-08-10 NOTE — Progress Notes (Signed)
I agree with above, today's CXR images reviewed

## 2014-08-17 ENCOUNTER — Other Ambulatory Visit (HOSPITAL_COMMUNITY): Payer: Self-pay | Admitting: *Deleted

## 2014-08-18 ENCOUNTER — Encounter (HOSPITAL_COMMUNITY)
Admission: RE | Admit: 2014-08-18 | Discharge: 2014-08-18 | Disposition: A | Discharge status: Home / Self Care | Payer: Medicare Other | Source: Ambulatory Visit | Attending: Nephrology | Admitting: Nephrology

## 2014-08-18 DIAGNOSIS — D631 Anemia in chronic kidney disease: Secondary | ICD-10-CM | POA: Diagnosis not present

## 2014-08-18 DIAGNOSIS — N184 Chronic kidney disease, stage 4 (severe): Secondary | ICD-10-CM | POA: Insufficient documentation

## 2014-08-18 DIAGNOSIS — Z5181 Encounter for therapeutic drug level monitoring: Secondary | ICD-10-CM | POA: Diagnosis not present

## 2014-08-18 DIAGNOSIS — Z79899 Other long term (current) drug therapy: Secondary | ICD-10-CM | POA: Insufficient documentation

## 2014-08-18 LAB — POCT HEMOGLOBIN-HEMACUE: Hemoglobin: 9.5 g/dL — ABNORMAL LOW (ref 12.0–15.0)

## 2014-08-18 MED ORDER — EPOETIN ALFA 20000 UNIT/ML IJ SOLN
20000.0000 [IU] | INTRAMUSCULAR | Status: DC
  Administered 2014-08-18: 20000 [IU] via SUBCUTANEOUS

## 2014-08-18 NOTE — Discharge Instructions (Signed)

## 2014-08-22 ENCOUNTER — Other Ambulatory Visit: Payer: Self-pay | Admitting: Cardiology

## 2014-08-22 DIAGNOSIS — I6523 Occlusion and stenosis of bilateral carotid arteries: Secondary | ICD-10-CM

## 2014-08-23 ENCOUNTER — Other Ambulatory Visit: Payer: Self-pay

## 2014-08-23 ENCOUNTER — Ambulatory Visit (HOSPITAL_BASED_OUTPATIENT_CLINIC_OR_DEPARTMENT_OTHER): Payer: Medicare Other

## 2014-08-23 ENCOUNTER — Ambulatory Visit (HOSPITAL_COMMUNITY): Payer: Medicare Other | Attending: Cardiovascular Disease

## 2014-08-23 ENCOUNTER — Other Ambulatory Visit (HOSPITAL_COMMUNITY): Payer: Medicare Other

## 2014-08-23 DIAGNOSIS — I6523 Occlusion and stenosis of bilateral carotid arteries: Secondary | ICD-10-CM | POA: Diagnosis not present

## 2014-08-23 DIAGNOSIS — I350 Nonrheumatic aortic (valve) stenosis: Secondary | ICD-10-CM

## 2014-08-23 HISTORY — DX: Nonrheumatic aortic (valve) stenosis: I35.0

## 2014-08-26 ENCOUNTER — Other Ambulatory Visit: Payer: Self-pay | Admitting: Cardiology

## 2014-08-29 ENCOUNTER — Ambulatory Visit (INDEPENDENT_AMBULATORY_CARE_PROVIDER_SITE_OTHER): Payer: Medicare Other | Admitting: Pulmonary Disease

## 2014-08-29 ENCOUNTER — Encounter: Payer: Self-pay | Admitting: Pulmonary Disease

## 2014-08-29 ENCOUNTER — Telehealth: Payer: Self-pay | Admitting: Pulmonary Disease

## 2014-08-29 ENCOUNTER — Ambulatory Visit (INDEPENDENT_AMBULATORY_CARE_PROVIDER_SITE_OTHER)
Admission: RE | Admit: 2014-08-29 | Discharge: 2014-08-29 | Disposition: A | Discharge status: Home / Self Care | Payer: Medicare Other | Source: Ambulatory Visit | Attending: Pulmonary Disease | Admitting: Pulmonary Disease

## 2014-08-29 ENCOUNTER — Other Ambulatory Visit (INDEPENDENT_AMBULATORY_CARE_PROVIDER_SITE_OTHER): Payer: Medicare Other

## 2014-08-29 VITALS — BP 120/56 | HR 76 | Ht 64.0 in | Wt 177.0 lb

## 2014-08-29 DIAGNOSIS — R0602 Shortness of breath: Secondary | ICD-10-CM

## 2014-08-29 DIAGNOSIS — I6523 Occlusion and stenosis of bilateral carotid arteries: Secondary | ICD-10-CM | POA: Diagnosis not present

## 2014-08-29 DIAGNOSIS — J90 Pleural effusion, not elsewhere classified: Secondary | ICD-10-CM | POA: Diagnosis not present

## 2014-08-29 DIAGNOSIS — D696 Thrombocytopenia, unspecified: Secondary | ICD-10-CM

## 2014-08-29 HISTORY — DX: Thrombocytopenia, unspecified: D69.6

## 2014-08-29 LAB — CBC WITH DIFFERENTIAL/PLATELET
BASOS ABS: 0 10*3/uL (ref 0.0–0.1)
BASOS PCT: 0.2 % (ref 0.0–3.0)
EOS ABS: 0 10*3/uL (ref 0.0–0.7)
EOS PCT: 0.2 % (ref 0.0–5.0)
HEMATOCRIT: 31.3 % — AB (ref 36.0–46.0)
Hemoglobin: 9.9 g/dL — ABNORMAL LOW (ref 12.0–15.0)
Lymphocytes Relative: 15.5 % (ref 12.0–46.0)
Lymphs Abs: 1.4 10*3/uL (ref 0.7–4.0)
MCHC: 31.8 g/dL (ref 30.0–36.0)
MCV: 90.4 fl (ref 78.0–100.0)
MONO ABS: 0.7 10*3/uL (ref 0.1–1.0)
MONOS PCT: 8.3 % (ref 3.0–12.0)
Neutro Abs: 6.7 10*3/uL (ref 1.4–7.7)
Neutrophils Relative %: 75.8 % (ref 43.0–77.0)
PLATELETS: 142 10*3/uL — AB (ref 150.0–400.0)
RBC: 3.46 Mil/uL — ABNORMAL LOW (ref 3.87–5.11)
RDW: 18.7 % — AB (ref 11.5–15.5)
WBC: 8.9 10*3/uL (ref 4.0–10.5)

## 2014-08-29 LAB — COMPREHENSIVE METABOLIC PANEL
ALBUMIN: 3.2 g/dL — AB (ref 3.5–5.2)
ALK PHOS: 117 U/L (ref 39–117)
ALT: 3 U/L (ref 0–35)
AST: 9 U/L (ref 0–37)
BILIRUBIN TOTAL: 0.6 mg/dL (ref 0.2–1.2)
BUN: 42 mg/dL — ABNORMAL HIGH (ref 6–23)
CO2: 29 mEq/L (ref 19–32)
Calcium: 9.1 mg/dL (ref 8.4–10.5)
Chloride: 106 mEq/L (ref 96–112)
Creatinine, Ser: 2.59 mg/dL — ABNORMAL HIGH (ref 0.40–1.20)
GFR: 18.61 mL/min — AB (ref >60.00)
Glucose, Bld: 156 mg/dL — ABNORMAL HIGH (ref 70–99)
Potassium: 3.8 mEq/L (ref 3.5–5.1)
SODIUM: 142 meq/L (ref 135–145)
Total Protein: 6.3 g/dL (ref 6.0–8.3)

## 2014-08-29 NOTE — Telephone Encounter (Signed)
Called and spoke to [place], [place]. Pt c/o cough with thick white mucus production, labored breathing, 4lb weight gain within 2 weeks, and RLL diminished. Pt is A & O, denies f/c/s, and CP/tightness. Pt has f/u with BQ on Friday 6/10. RN wanted pt to be worked in sooner.   TP please advise. Thanks.

## 2014-08-29 NOTE — Telephone Encounter (Signed)
LMTCb x 1

## 2014-08-29 NOTE — Patient Instructions (Signed)
We will arrange a thoracentesis tomorrow We will arrange a CT scan of your chest immediately following the thoracentesis Take a Tylenol as needed for chest pain We will see you back on Friday as previously scheduled

## 2014-08-29 NOTE — Assessment & Plan Note (Signed)
She has fairly nondescript symptoms today which I believe are related to the pleural effusion. Further, she has no right upper quadrant tenderness or fever. However, like to get a CBC with differential as well as liver function tests today to make sure there is no evidence of biliary obstruction.

## 2014-08-29 NOTE — Telephone Encounter (Signed)
Disregard previous message. BQ has openings this week. Called and spoke to [place], [place], appt made with BQ today (6/7) at 2:45. Otila Kluver verbalized understanding and stated she would let pt know. Nothing further needed at this time.

## 2014-08-29 NOTE — Assessment & Plan Note (Addendum)
Today on physical exam as well as my review of the images from her chest x-ray it appears that the right lung pleural effusion has increased in size. She had pneumonia in December, a normal x-ray in January, and then she returned to the hospital in February with cholecystitis. At that point she developed a pleural effusion. I believe that the pleural effusion is a reactive process from the cholecystitis but it's also possible that she may have had a recurrent episode of healthcare associated pneumonia around February. Regardless, she had 2 thoracenteses performed in April which showed a neutrophilic infiltrate but cultures were negative and cytology was negative both times. I still feel that this is most likely a reactive process to the cholecystitis. It seems to me that this worsening pleural effusion is causing her to feel worse and so I think she will benefit from a repeat thoracentesis. She may ultimately need a Pleurx catheter.  Plan: Ultrasound-guided thoracentesis tomorrow Repeat CT chest immediately afterwards to ensure there is no other pulmonary process contributing to the ongoing pleural effusion Depending on the results from the pleural fluid analysis, I will either decide to use steroids to calm down the inflammation causing the pleural effusion or have her see thoracic surgery for a pleural drain.  She is not a good candidate for pleural surgery.

## 2014-08-29 NOTE — Progress Notes (Signed)
Subjective:    Patient ID: [name], female    DOB: 1925/08/09, 81 y.o.   MRN: [id number]  Synopsis: Seen by [facility] for the first time in 2016 after repeated hospitalizations. She was hospitalized in December 2015 with community-acquired pneumonia, this cleared up as of January 2016 with a chest x-ray which was normal. However in February 2016 she was admitted for cholecystitis and choledocholithiasis. She had an ERCP and biliary drain placed. She had a new pleural effusion at that time. By April 2016 she had 2 separate thoracenteses which showed a neutrophilic infiltrate, cultures were negative and cytology was negative.  HPI Chief Complaint  Patient presents with  . Acute Visit    Pt c/o SOB with activity, prod cough with white thick mucus, wheezing. Sinus pressure/drainage. Denies any chest tightness/congestion.   [name] returns to clinic today with very nonspecific complaints. She says that she's had a mild headache today, with some cough and chest congestion. She does not feel short of breath. She denies fevers or chills. She does not have chest pain. She had some nausea and vomiting on Sunday. She has felt more fatigued for the last 2-3 days. Otherwise though she has no other complaints she says she's feeling "fine" right now. She was advised by her nurse at home to come in and get checked out because she felt that her chest exam had changed. She says her weight has been stable. She says that in general her strength is not come back since she was hospitalized..  Past Medical History  Diagnosis Date  . CKD (chronic kidney disease), stage IV     a. Baseline Cr reported 2.5-2.6.  Marland Kitchen Anxiety   . Diabetes mellitus     diabetic retinopathy  . Anemia   . Moderate aortic stenosis     a. By echo 10/2013.  Marland Kitchen Urinary tract infection   . Fall at home   . Colostomy care     colon ca - h/o bloody output from bag 12/2012 (ED visit)  . Colon cancer 30 year ago  . Chronic atrial  fibrillation     a. not on anticoagulation due to increased risk of falls, advanced age.  . Chronic diastolic CHF (congestive heart failure)     a. Echo 10/2013: mild LVH, EF 60-65%, mod AS, mildly dilated LA, mod dilated RA, PASP 33.  Marland Kitchen Hypertension   . Carotid disease, bilateral     a. Carotid duplex 0/7622: 63-33% RICA/LICA.  Marland Kitchen H/O cardiovascular stress test     a. 2002: nuc negative for ischemia, EF 65%.  . Shortness of breath dyspnea       Review of Systems  Constitutional: Positive for fatigue. Negative for fever and chills.  HENT: Positive for postnasal drip, rhinorrhea and sinus pressure.   Respiratory: Positive for cough. Negative for shortness of breath and wheezing.   Cardiovascular: Negative for chest pain, palpitations and leg swelling.       Objective:   Physical Exam Filed Vitals:   08/29/14 1514  BP: 120/56  Pulse: 76  Height: 5\' 4"  (1.626 m)  Weight: 177 lb (80.287 kg)  SpO2: 97%   RA  Gen: chronically ill  appearing HENT: OP clear, TM's clear, neck supple PULM: diminished and dull to percussion R base, normal effort CV: RRR, no mgr, trace edema GI: BS+, soft, nontender Derm: no cyanosis or rash Psyche: normal mood and affect  Hospital records from February and April reviewed were she was seen by  general surgery and had repeated thoracenteses      Assessment & Plan:  Pleural effusion Today on physical exam as well as my review of the images from her chest x-ray it appears that the right lung pleural effusion has increased in size. She had pneumonia in December, a normal x-ray in January, and then she returned to the hospital in February with cholecystitis. At that point she developed a pleural effusion. I believe that the pleural effusion is a reactive process from the cholecystitis but it's also possible that she may have had a recurrent episode of healthcare associated pneumonia around February. Regardless, she had 2 thoracenteses performed in April  which showed a neutrophilic infiltrate but cultures were negative and cytology was negative both times. I still feel that this is most likely a reactive process to the cholecystitis. It seems to me that this worsening pleural effusion is causing her to feel worse and so I think she will benefit from a repeat thoracentesis. She may ultimately need a Pleurx catheter.  Plan: Ultrasound-guided thoracentesis tomorrow Repeat CT chest immediately afterwards to ensure there is no other pulmonary process contributing to the ongoing pleural effusion Depending on the results from the pleural fluid analysis, I will either decide to use steroids to calm down the inflammation causing the pleural effusion or have her see thoracic surgery for a pleural drain.  She is not a good candidate for pleural surgery.   Moderate aortic stenosis Murmur noted today, not a good candidate for lung surgery given age and aortic stenosis   Choledocholithiasis with acute cholecystitis She has fairly nondescript symptoms today which I believe are related to the pleural effusion. Further, she has no right upper quadrant tenderness or fever. However, like to get a CBC with differential as well as liver function tests today to make sure there is no evidence of biliary obstruction.     Current outpatient prescriptions:  .  acetaminophen (TYLENOL) 500 MG tablet, Take 500 mg by mouth every 6 (six) hours as needed for mild pain. , Disp: , Rfl:  .  aspirin EC 81 MG tablet, Take 81 mg by mouth daily., Disp: , Rfl:  .  calcitRIOL (ROCALTROL) 0.25 MCG capsule, Take 0.25 mcg by mouth daily. , Disp: , Rfl:  .  carvedilol (COREG) 3.125 MG tablet, Take 1 tablet (3.125 mg total) by mouth 2 (two) times daily with a meal., Disp: 60 tablet, Rfl: 0 .  clotrimazole-betamethasone (LOTRISONE) lotion, Apply 1 application topically daily as needed (apply to rash on stomach as needed). , Disp: , Rfl: 0 .  docusate sodium (COLACE) 100 MG capsule, Take  100 mg by mouth daily as needed for constipation. , Disp: , Rfl:  .  epoetin alfa (EPOGEN,PROCRIT) 40981 UNIT/ML injection, 20,000 Units. Every two weeks, Disp: , Rfl:  .  ferrous sulfate 325 (65 FE) MG tablet, Take 1 tablet (325 mg total) by mouth 2 (two) times daily with a meal., Disp: 60 tablet, Rfl: 0 .  furosemide (LASIX) 80 MG tablet, Take 80 mg by mouth every morning. , Disp: , Rfl: 3 .  HYDROcodone-acetaminophen (NORCO/VICODIN) 5-325 MG per tablet, Take 1 tablet by mouth every 4 (four) hours as needed for moderate pain., Disp: 20 tablet, Rfl: 0 .  insulin glargine (LANTUS) 100 UNIT/ML injection, Inject 8 Units into the skin at bedtime as needed (if cbg over 150). , Disp: , Rfl:  .  linagliptin (TRADJENTA) 5 MG TABS tablet, Take 5 mg by mouth daily. , Disp: ,  Rfl:  .  LORazepam (ATIVAN) 0.5 MG tablet, Take 0.5 mg by mouth 2 (two) times daily., Disp: , Rfl:  .  meclizine (ANTIVERT) 12.5 MG tablet, Take 12.5 mg by mouth as needed for dizziness. , Disp: , Rfl:  .  mirtazapine (REMERON) 7.5 MG tablet, Take 7.5 mg by mouth at bedtime. , Disp: , Rfl:  .  promethazine (PHENERGAN) 12.5 MG tablet, Take 1 tablet (12.5 mg total) by mouth every 6 (six) hours as needed for nausea or vomiting., Disp: 30 tablet, Rfl: 0 .  simvastatin (ZOCOR) 20 MG tablet, TAKE 1 TABLET (20 MG TOTAL) BY MOUTH AT BEDTIME., Disp: 30 tablet, Rfl: 5 .  vitamin B-12 (CYANOCOBALAMIN) 1000 MCG tablet, Take 1,000 mcg by mouth daily., Disp: , Rfl:

## 2014-08-29 NOTE — Telephone Encounter (Signed)
[phone] calling back

## 2014-08-29 NOTE — Assessment & Plan Note (Signed)
Murmur noted today, not a good candidate for lung surgery given age and aortic stenosis

## 2014-08-30 ENCOUNTER — Ambulatory Visit (HOSPITAL_COMMUNITY)
Admission: RE | Admit: 2014-08-30 | Discharge: 2014-08-30 | Disposition: A | Discharge status: Home / Self Care | Payer: Medicare Other | Source: Ambulatory Visit | Attending: Pulmonary Disease | Admitting: Pulmonary Disease

## 2014-08-30 DIAGNOSIS — E1122 Type 2 diabetes mellitus with diabetic chronic kidney disease: Secondary | ICD-10-CM | POA: Diagnosis not present

## 2014-08-30 DIAGNOSIS — N186 End stage renal disease: Secondary | ICD-10-CM | POA: Diagnosis not present

## 2014-08-30 DIAGNOSIS — Z992 Dependence on renal dialysis: Secondary | ICD-10-CM | POA: Insufficient documentation

## 2014-08-30 DIAGNOSIS — J948 Other specified pleural conditions: Secondary | ICD-10-CM | POA: Insufficient documentation

## 2014-08-30 DIAGNOSIS — Z85038 Personal history of other malignant neoplasm of large intestine: Secondary | ICD-10-CM | POA: Diagnosis not present

## 2014-08-30 DIAGNOSIS — J90 Pleural effusion, not elsewhere classified: Secondary | ICD-10-CM | POA: Diagnosis not present

## 2014-08-30 DIAGNOSIS — I700 Atherosclerosis of aorta: Secondary | ICD-10-CM | POA: Insufficient documentation

## 2014-08-30 DIAGNOSIS — I2510 Atherosclerotic heart disease of native coronary artery without angina pectoris: Secondary | ICD-10-CM | POA: Diagnosis not present

## 2014-08-30 DIAGNOSIS — Z9889 Other specified postprocedural states: Secondary | ICD-10-CM

## 2014-08-30 LAB — BODY FLUID CELL COUNT WITH DIFFERENTIAL
Eos, Fluid: 0 %
LYMPHS FL: 87 %
MONOCYTE-MACROPHAGE-SEROUS FLUID: 8 % — AB (ref 50–90)
Neutrophil Count, Fluid: 5 % (ref 0–25)
Total Nucleated Cell Count, Fluid: 220 cu mm (ref 0–1000)

## 2014-08-30 MED ORDER — LIDOCAINE HCL (PF) 1 % IJ SOLN
INTRAMUSCULAR | Status: AC
  Filled 2014-08-30: qty 10

## 2014-08-30 NOTE — Procedures (Addendum)
Successful US guided right thoracentesis. Yielded 1.4 liters of serous fluid. Pt tolerated procedure well. No immediate complications.  Specimen was sent for labs. CT Chest was previously ordered and performed post procedure.  CT revealed trapped right lung-hydropneumothorax, patient is asymptomatic-denies any chest pain or shortness of breath. She has a scheduled F/U appointment with Dr. Lake Bells on Friday 09/01/14 with a CXR, these results have been discussed today with the patient and her family as well as Dr. Lake Bells. She will be with her family overnight and was instructed to go to the Emergency Department with any chest pain or shortness of breath. Dr. Lake Bells agrees with the above plan and the patient and her family state understanding.  Tsosie Billing D PA-C 08/30/2014 12:48 PM

## 2014-09-01 ENCOUNTER — Ambulatory Visit (INDEPENDENT_AMBULATORY_CARE_PROVIDER_SITE_OTHER)
Admission: RE | Admit: 2014-09-01 | Discharge: 2014-09-01 | Disposition: A | Discharge status: Home / Self Care | Payer: Medicare Other | Source: Ambulatory Visit | Attending: Pulmonary Disease | Admitting: Pulmonary Disease

## 2014-09-01 ENCOUNTER — Ambulatory Visit (INDEPENDENT_AMBULATORY_CARE_PROVIDER_SITE_OTHER): Payer: Medicare Other | Admitting: Pulmonary Disease

## 2014-09-01 ENCOUNTER — Encounter: Payer: Self-pay | Admitting: Pulmonary Disease

## 2014-09-01 ENCOUNTER — Ambulatory Visit (HOSPITAL_COMMUNITY)
Admission: RE | Admit: 2014-09-01 | Discharge: 2014-09-01 | Disposition: A | Discharge status: Home / Self Care | Payer: Medicare Other | Source: Ambulatory Visit | Attending: Nephrology | Admitting: Nephrology

## 2014-09-01 VITALS — BP 142/66 | HR 64 | Ht 64.0 in | Wt 175.0 lb

## 2014-09-01 DIAGNOSIS — J948 Other specified pleural conditions: Secondary | ICD-10-CM

## 2014-09-01 DIAGNOSIS — Z5181 Encounter for therapeutic drug level monitoring: Secondary | ICD-10-CM | POA: Diagnosis not present

## 2014-09-01 DIAGNOSIS — J90 Pleural effusion, not elsewhere classified: Secondary | ICD-10-CM

## 2014-09-01 DIAGNOSIS — Z79899 Other long term (current) drug therapy: Secondary | ICD-10-CM | POA: Diagnosis not present

## 2014-09-01 DIAGNOSIS — D631 Anemia in chronic kidney disease: Secondary | ICD-10-CM | POA: Diagnosis not present

## 2014-09-01 DIAGNOSIS — N184 Chronic kidney disease, stage 4 (severe): Secondary | ICD-10-CM | POA: Insufficient documentation

## 2014-09-01 DIAGNOSIS — I6523 Occlusion and stenosis of bilateral carotid arteries: Secondary | ICD-10-CM

## 2014-09-01 LAB — POCT HEMOGLOBIN-HEMACUE: Hemoglobin: 9.4 g/dL — ABNORMAL LOW (ref 12.0–15.0)

## 2014-09-01 MED ORDER — EPOETIN ALFA 20000 UNIT/ML IJ SOLN: 20000.0000 [IU] | INTRAMUSCULAR | Status: DC

## 2014-09-01 MED ORDER — EPOETIN ALFA 20000 UNIT/ML IJ SOLN
INTRAMUSCULAR | Status: AC
  Administered 2014-09-01: 20000 [IU] via SUBCUTANEOUS
  Filled 2014-09-01: qty 1

## 2014-09-01 NOTE — Patient Instructions (Signed)
We will call you with the results of today's chest x-ray We will refer you to Dr. Roxan Hockey for a Pleurx catheter We will see you back in 6 weeks

## 2014-09-01 NOTE — Progress Notes (Signed)
Subjective:    Patient ID: [name], female    DOB: [birth year], 81 y.o.   MRN: [id number]  Synopsis: Seen by [facility] for the first time in 2016 after repeated hospitalizations. She was hospitalized in December 2015 with community-acquired pneumonia, this cleared up as of January 2016 with a chest x-ray which was normal. However in February 2016 she was admitted for cholecystitis and choledocholithiasis. She had an ERCP and biliary drain placed. She had a new pleural effusion at that time. By April 2016 she had 2 separate thoracenteses which showed a neutrophilic infiltrate, cultures were negative and cytology was negative.  HPI  Chief Complaint  Patient presents with  . Follow-up    post thoracentesis.  Pt has no complaints re: this.  does note a headache.     No problems breathin gsince the thoracentesis. She had some mild R sided pain and stomach pain yesterday.  Worse with breathing, now gone. Stays cold all the time. Cough has improved, no mucus production.   Past Medical History  Diagnosis Date  . CKD (chronic kidney disease), stage IV     a. Baseline Cr reported 2.5-2.6.  Marland Kitchen Anxiety   . Diabetes mellitus     diabetic retinopathy  . Anemia   . Moderate aortic stenosis     a. By echo 10/2013.  Marland Kitchen Urinary tract infection   . Fall at home   . Colostomy care     colon ca - h/o bloody output from bag 12/2012 (ED visit)  . Colon cancer 30 year ago  . Chronic atrial fibrillation     a. not on anticoagulation due to increased risk of falls, advanced age.  . Chronic diastolic CHF (congestive heart failure)     a. Echo 10/2013: mild LVH, EF 60-65%, mod AS, mildly dilated LA, mod dilated RA, PASP 33.  Marland Kitchen Hypertension   . Carotid disease, bilateral     a. Carotid duplex 09/3426: 76-81% RICA/LICA.  Marland Kitchen H/O cardiovascular stress test     a. 2002: nuc negative for ischemia, EF 65%.  . Shortness of breath dyspnea       Review of Systems  Constitutional: Negative for  fever, chills and fatigue.  HENT: Negative for postnasal drip, rhinorrhea and sinus pressure.   Respiratory: Negative for shortness of breath and wheezing.   Cardiovascular: Negative for chest pain, palpitations and leg swelling.       Objective:   Physical Exam  Filed Vitals:   09/01/14 1552  BP: 142/66  Pulse: 64  Height: 5\' 4"  (1.626 m)  Weight: 175 lb (79.379 kg)  SpO2: 98%   RA  Gen: chronically ill  appearing HENT: OP clear, TM's clear, neck supple PULM: diminished to percussion R base, clear CV: RRR, no mgr, trace edema GI: BS+, soft, nontender Derm: no cyanosis or rash Psyche: normal mood and affect  CT scan from 2 days ago reviewed with pleural thickening, no airspace disease, pneumothorax noted  Pleural fluid analysis from 2 days ago reviewed, there is less of a pleocytosis, there is no infection or evidence of malignancy on culture and cytology      Assessment & Plan:  Pleural effusion, right Avielle has had recurrent pleural effusions ever since her episode of acute cholecystitis in April. We performed a thoracentesis for the third time this year just 2 days ago. She had pleural thickening and a pneumothorax ex vacuo after the procedure.  Because she has improvement in symptoms after thoracentesis I think  that she would benefit from a Pleurx catheter. However, I explained to her that it's very likely that this may never stopped draining fluid because of the pleural thickening seen on the CT chest and very likely lung entrapment. I think it would be reasonable to try a 4 to six-week trial of Pleurx drainage to see if she has auto pleurodesis.  Plan: Refer to thoracic surgery for evaluation for Pleurx catheter Chest x-ray F/u 6 weeks     Current outpatient prescriptions:  .  acetaminophen (TYLENOL) 500 MG tablet, Take 500 mg by mouth every 6 (six) hours as needed for mild pain. , Disp: , Rfl:  .  aspirin EC 81 MG tablet, Take 81 mg by mouth daily., Disp: ,  Rfl:  .  calcitRIOL (ROCALTROL) 0.25 MCG capsule, Take 0.25 mcg by mouth daily. , Disp: , Rfl:  .  carvedilol (COREG) 3.125 MG tablet, Take 1 tablet (3.125 mg total) by mouth 2 (two) times daily with a meal., Disp: 60 tablet, Rfl: 0 .  clotrimazole-betamethasone (LOTRISONE) lotion, Apply 1 application topically daily as needed (apply to rash on stomach as needed). , Disp: , Rfl: 0 .  docusate sodium (COLACE) 100 MG capsule, Take 100 mg by mouth daily as needed for constipation. , Disp: , Rfl:  .  epoetin alfa (EPOGEN,PROCRIT) 22979 UNIT/ML injection, 20,000 Units. Every two weeks, Disp: , Rfl:  .  ferrous sulfate 325 (65 FE) MG tablet, Take 1 tablet (325 mg total) by mouth 2 (two) times daily with a meal., Disp: 60 tablet, Rfl: 0 .  furosemide (LASIX) 80 MG tablet, Take 80 mg by mouth every morning. , Disp: , Rfl: 3 .  HYDROcodone-acetaminophen (NORCO/VICODIN) 5-325 MG per tablet, Take 1 tablet by mouth every 4 (four) hours as needed for moderate pain., Disp: 20 tablet, Rfl: 0 .  insulin glargine (LANTUS) 100 UNIT/ML injection, Inject 8 Units into the skin at bedtime as needed (if cbg over 150). , Disp: , Rfl:  .  linagliptin (TRADJENTA) 5 MG TABS tablet, Take 5 mg by mouth daily. , Disp: , Rfl:  .  LORazepam (ATIVAN) 0.5 MG tablet, Take 0.5 mg by mouth 2 (two) times daily., Disp: , Rfl:  .  meclizine (ANTIVERT) 12.5 MG tablet, Take 12.5 mg by mouth as needed for dizziness. , Disp: , Rfl:  .  mirtazapine (REMERON) 7.5 MG tablet, Take 7.5 mg by mouth at bedtime. , Disp: , Rfl:  .  promethazine (PHENERGAN) 12.5 MG tablet, Take 1 tablet (12.5 mg total) by mouth every 6 (six) hours as needed for nausea or vomiting., Disp: 30 tablet, Rfl: 0 .  simvastatin (ZOCOR) 20 MG tablet, TAKE 1 TABLET (20 MG TOTAL) BY MOUTH AT BEDTIME., Disp: 30 tablet, Rfl: 5 .  vitamin B-12 (CYANOCOBALAMIN) 1000 MCG tablet, Take 1,000 mcg by mouth daily., Disp: , Rfl:  No current facility-administered medications for this  visit.  Facility-Administered Medications Ordered in Other Visits:  .  epoetin alfa (EPOGEN,PROCRIT) injection 20,000 Units, 20,000 Units, Subcutaneous, Q14 Days, Corliss Parish, MD

## 2014-09-01 NOTE — Assessment & Plan Note (Signed)
[name] has had recurrent pleural effusions ever since her episode of acute cholecystitis in April. We performed a thoracentesis for the third time this year just 2 days ago. She had pleural thickening and a pneumothorax ex vacuo after the procedure.  Because she has improvement in symptoms after thoracentesis I think that she would benefit from a Pleurx catheter. However, I explained to her that it's very likely that this may never stopped draining fluid because of the pleural thickening seen on the CT chest and very likely lung entrapment. I think it would be reasonable to try a 4 to six-week trial of Pleurx drainage to see if she has auto pleurodesis.  Plan: Refer to thoracic surgery for evaluation for Pleurx catheter Chest x-ray F/u 6 weeks

## 2014-09-02 LAB — BODY FLUID CULTURE
CULTURE: NO GROWTH
Gram Stain: NONE SEEN
Special Requests: NORMAL

## 2014-09-05 ENCOUNTER — Telehealth: Payer: Self-pay | Admitting: Cardiology

## 2014-09-05 NOTE — Progress Notes (Signed)
Cardiology Office Note   Date:  09/06/2014   ID:  [name], DOB [birth year], MRN [id number]  Patient Care Team: Briscoe Deutscher, MD as PCP - General (Family Medicine) Natasha Parish, MD as Consulting Physician (Nephrology) Natasha Margarita, MD as Consulting Physician (Cardiology) Natasha Castle, MD as Consulting Physician (Gastroenterology) Natasha Doom, MD as Consulting Physician (Pulmonary Disease)     Chief Complaint  Patient presents with  . Aortic Stenosis  . Congestive Heart Failure     History of Present Illness: [name] is a 81 y.o. female with a hx of diastolic HF, aortic stenosis, chronic atrial fibrillation, HTN, CKD, diabetes, carotid stenosis,: CA status post right hemicolectomy. She is not on anticoagulation secondary to increased bleeding risk.  Admitted in 04/2014 with acute on chronic diastolic CHF with bilateral pleural effusions in the setting of acalculus acute cholecystitis treated with percutaneous cholecystotomy tube and ERCP with stone extraction.  Admitted 4/22-5/2 with acute hypoxic respiratory failure in the setting of acute on chronic diastolic CHF complicated by right exudative pleural effusion. She underwent thoracentesis 2. Cytology was negative for malignant cells. Culture was negative. She was followed by critical care. Recurrent pleural effusion was felt to be related to her gallbladder process (parapneumonic). She has been followed closely by pulmonology. She had repeat thoracentesis due to recurrent effusion last week. This was complicated by pleural thickening and a pneumothorax ex vacuo after the procedure. She has been referred to thoracic surgery for evaluation for Pleurx catheter.  She recently underwent repeat echocardiogram 08/23/14 for routine follow-up of her aortic stenosis. Aortic stenosis was noted to be severe with a mean gradient of 34 mmHg.  She has an appointment later this week with [name] to discuss findings and make  further recommendations.  Over the last several days, she has noticed a 4 lb weight gain as well as increasing LE edema. She denies orthopnea or PND. She is chronically short of breath. She describes NYHA 3 symptoms. She denies chest discomfort. She denies syncope. She does note some dizziness in the AM.  She has an occasional NP cough.  No wheezing.   Studies/Reports Reviewed Today:  Dg Chest 2 View  09/01/2014     IMPRESSION: Small to moderate right hydropneumothorax, likely mildly decreased.   Electronically Signed   By: [name] M.D.   On: 09/01/2014 16:57   Ct Chest Wo Contrast  08/30/2014     IMPRESSION: 1. Moderate size loculated hydro pneumothorax on the right following thoracentesis today. Appearance suggests underlying adhesions of the right pleural space. No evidence of tension component. 2. Persistent partial collapse of the right middle lobe with airspace disease and probable fluid in the bronchus. 3. Stable small simple left pleural effusion. 4. Diffuse atherosclerosis. 5. These results were discussed by telephone with Dr. Markus [name], covering interventional services at [name] today. He will relay the results to Dr. Simonne [name].   Electronically Signed   By: Richardean Sale M.D.   On: 08/30/2014 13:28   US Thoracentesis Asp Pleural Space W/img Guide  08/30/2014      IMPRESSION: Successful ultrasound-guided right sided thoracentesis yielding 1.4 liters of pleural fluid.  Read By:  Natasha Billing PA-C   Electronically Signed   By: [name] M.D.   On: 08/30/2014 13:41    Echo 08/23/14 Moderate LVH, vigorous LV function, EF 65-70%, normal wall motion Severe aortic stenosis, mean gradient 34 mmHg, peak gradient 61 mmHg MAC Mild LAE  PASP 34 mmHg Trivial effusion  Carotid US 08/23/14 Bilateral ICA 1-39% >> follow-up 1 year   Past Medical History  Diagnosis Date  . CKD (chronic kidney disease), stage IV     a. Baseline Cr reported 2.5-2.6.  Marland Kitchen Anxiety   . Diabetes  mellitus     diabetic retinopathy  . Anemia   . Moderate aortic stenosis     a. By echo 10/2013.  Marland Kitchen Urinary tract infection   . Fall at home   . Colostomy care     colon ca - h/o bloody output from bag 12/2012 (ED visit)  . Colon cancer 30 year ago  . Chronic atrial fibrillation     a. not on anticoagulation due to increased risk of falls, advanced age.  . Chronic diastolic CHF (congestive heart failure)     a. Echo 10/2013: mild LVH, EF 60-65%, mod AS, mildly dilated LA, mod dilated RA, PASP 33.  Marland Kitchen Hypertension   . Carotid disease, bilateral     a. Carotid duplex 06/979: 19-14% RICA/LICA.  Marland Kitchen H/O cardiovascular stress test     a. 2002: nuc negative for ischemia, EF 65%.  . Shortness of breath dyspnea     Past Surgical History  Procedure Laterality Date  . Abdominal surgery    . Abdominal hysterectomy    . Colon surgery    . Appendectomy    . Colostomy    . Cardioversion  09/30/2011    Procedure: CARDIOVERSION;  Surgeon: Natasha Margarita, MD;  Location: [facility];  Service: Cardiovascular;  Laterality: N/A;  . I&d extremity  11/20/2011    Procedure: IRRIGATION AND DEBRIDEMENT EXTREMITY;  Surgeon: Alta Corning, MD;  Location: [facility];  Service: Orthopedics;  Laterality: Left;  . Av fistula placement Left 06/02/2012    Procedure: ARTERIOVENOUS (AV) FISTULA CREATION;  Surgeon: Conrad Whitinsville, MD;  Location: [facility];  Service: Vascular;  Laterality: Left;  Bascilic Fistula  . Bascilic vein transposition Left 08/18/2012    Procedure: BASCILIC VEIN TRANSPOSITION;  Surgeon: Conrad [place], MD;  Location: [facility];  Service: Vascular;  Laterality: Left;  Left 2nd stage Basilic Vein Transposition   . Ercp N/A 07/11/2014    Procedure: ENDOSCOPIC RETROGRADE CHOLANGIOPANCREATOGRAPHY (ERCP);  Surgeon: Natasha Castle, MD;  Location: [facility];  Service: Endoscopy;  Laterality: N/A;  . Biliary stent placement N/A 07/11/2014    Procedure: BILIARY STENT PLACEMENT;  Surgeon: Natasha Castle, MD;  Location: [facility];  Service: Endoscopy;  Laterality: N/A;  . Ercp N/A 07/21/2014    Procedure: ENDOSCOPIC RETROGRADE CHOLANGIOPANCREATOGRAPHY (ERCP);  Surgeon: Natasha Castle, MD;  Location: [facility];  Service: Endoscopy;  Laterality: N/A;     Current Outpatient Prescriptions  Medication Sig Dispense Refill  . acetaminophen (TYLENOL) 500 MG tablet Take 500 mg by mouth every 6 (six) hours as needed for mild pain.     Marland Kitchen aspirin EC 81 MG tablet Take 81 mg by mouth daily.    . calcitRIOL (ROCALTROL) 0.25 MCG capsule Take 0.25 mcg by mouth daily.     . carvedilol (COREG) 3.125 MG tablet Take 1 tablet (3.125 mg total) by mouth daily with breakfast.    . clotrimazole-betamethasone (LOTRISONE) lotion Apply 1 application topically daily as needed (apply to rash on stomach as needed).   0  . docusate sodium (COLACE) 100 MG capsule Take 100 mg by mouth daily as needed for constipation.     Marland Kitchen epoetin alfa (EPOGEN,PROCRIT) 78295 UNIT/ML injection 20,000 Units.  Every two weeks    . ferrous sulfate 325 (65 FE) MG tablet Take 1 tablet (325 mg total) by mouth 2 (two) times daily with a meal. 60 tablet 0  . furosemide (LASIX) 80 MG tablet Take 80 mg by mouth every morning.   3  . HYDROcodone-acetaminophen (NORCO/VICODIN) 5-325 MG per tablet Take 1 tablet by mouth every 4 (four) hours as needed for moderate pain. 20 tablet 0  . insulin glargine (LANTUS) 100 UNIT/ML injection Inject 8 Units into the skin at bedtime as needed (if cbg over 150).     Marland Kitchen linagliptin (TRADJENTA) 5 MG TABS tablet Take 5 mg by mouth daily.     Marland Kitchen LORazepam (ATIVAN) 0.5 MG tablet Take 0.5 mg by mouth 2 (two) times daily.    . meclizine (ANTIVERT) 12.5 MG tablet Take 12.5 mg by mouth as needed for dizziness.     . mirtazapine (REMERON) 7.5 MG tablet Take 7.5 mg by mouth at bedtime.     . promethazine (PHENERGAN) 12.5 MG tablet Take 1 tablet (12.5 mg total) by mouth every 6 (six) hours as needed for nausea or vomiting. 30 tablet 0  .  simvastatin (ZOCOR) 20 MG tablet TAKE 1 TABLET (20 MG TOTAL) BY MOUTH AT BEDTIME. 30 tablet 5  . vitamin B-12 (CYANOCOBALAMIN) 1000 MCG tablet Take 1,000 mcg by mouth daily.     No current facility-administered medications for this visit.    Allergies:   Levaquin; Codeine; Keflet; Morphine and related; and Oxycodone    Social History:  The patient  reports that she has never smoked. Her smokeless tobacco use includes Snuff. She reports that she does not drink alcohol or use illicit drugs.   Family History:  The patient's family history includes Cancer in her son; Diabetes in her daughter, sister, and son; Heart attack in her father; Heart disease in her father and son; Hyperlipidemia in her daughter and son; Hypertension in her daughter and son; Other in her daughter; Stroke in her father.    ROS:   Please see the history of present illness.   Review of Systems  Constitution: Positive for chills, decreased appetite, malaise/fatigue and weight gain.  HENT: Positive for headaches.   Cardiovascular: Positive for dyspnea on exertion, irregular heartbeat and leg swelling.  Respiratory: Positive for cough.   Hematologic/Lymphatic: Bruises/bleeds easily.  Musculoskeletal: Positive for back pain and joint pain.  Gastrointestinal: Positive for abdominal pain, nausea and vomiting.  Neurological: Positive for dizziness.  Psychiatric/Behavioral: Positive for depression. The patient is nervous/anxious.   All other systems reviewed and are negative.     PHYSICAL EXAM: VS:  BP 120/52 mmHg  Pulse 59  Ht 5\' 4"  (1.626 m)  Wt 175 lb (79.379 kg)  BMI 30.02 kg/m2    Wt Readings from Last 3 Encounters:  09/06/14 175 lb (79.379 kg)  09/01/14 175 lb (79.379 kg)  08/29/14 177 lb (80.287 kg)     GEN: Well nourished, well developed, in no acute distress HEENT: normal Neck: ? + JVD at 90 degrees,  no masses Cardiac:  Normal S1/diminished S2, irregularly irregular rhythm; 1-6/1 systolic murmur RUSB,   no rubs or gallops, 2+ bilateral LE edema to the knees (R>L) Respiratory: Decreased breath sounds at the right base, no wheezing, no rales GI: soft, nontender, nondistended, + BS MS: no deformity or atrophy Skin: warm and dry  Neuro:  CNs II-XII intact, Strength and sensation are intact Psych: Normal affect   EKG:  EKG is ordered today.  It demonstrates:   Atrial fibrillation, HR 59   Recent Labs: 05/14/2014: Magnesium 2.2 07/14/2014: B Natriuretic Peptide 526.1* 07/17/2014: TSH 0.935 08/29/2014: ALT 3; BUN 42*; Creatinine, Ser 2.59*; Platelets 142.0*; Potassium 3.8; Sodium 142 09/01/2014: Hemoglobin 9.4*    Lipid Panel    Component Value Date/Time   CHOL 87 07/14/2014 1716   TRIG 136.0 06/15/2014 1013   HDL 36.30* 06/15/2014 1013   CHOLHDL 3 06/15/2014 1013   VLDL 27.2 06/15/2014 1013   LDLCALC 29 06/15/2014 1013      ASSESSMENT AND PLAN:  Chronic diastolic CHF (congestive heart failure):  She has a 4 pound weight gain and increasing LE edema. Diuresis will be inhibited by her chronic kidney disease. I suspect that she does have some volume overload related to diastolic CHF in the setting of severe aortic stenosis. She has follow-up with [name] later this week. I will have her increase her Lasix to 80 mg in the morning and 40 mg in the afternoon for 2 days only. Repeat BMET when she sees [name] Friday of this week.  Chronic atrial fibrillation:  She is not a candidate for anticoagulation secondary to bleeding risk. Heart rate is slower than previous. Question if this is contributing to her dizziness. I will decrease her Coreg to 3.125 mg once daily. If she continues to have slow heart rates, consider discontinuing Coreg.  CKD (chronic kidney disease), unspecified stage:  Monitor renal function closely with adjustments in diuretic. She sees nephrology every 2 months.  Severe aortic stenosis:  She would likely only be a candidate for TAVR given her comorbidities. I have  asked her to follow-up with [name] later this week to discuss further management of her aortic stenosis. I suspect her severe aortic stenosis likely contributing to worsening heart failure.  Recurrent pleural effusion on right:  She sees thoracic surgery later this week to discuss Pleurx catheter placement.  Essential hypertension:  Controlled.  DNR (do not resuscitate)     Current medicines are reviewed at length with the patient today.  Concerns regarding medicines are as outlined above.  The following changes have been made:    As above  Labs/ tests ordered today include:   Orders Placed This Encounter  Procedures  . Basic Metabolic Panel (BMET)  . EKG 12-Lead    Disposition:   FU with [name] Friday as planned.   Signed, Versie Starks, MHS 09/06/2014 1:35 PM    [facility] [address], [place], [place]  [address] Phone: [phone]; Fax: [phone]

## 2014-09-05 NOTE — Telephone Encounter (Signed)
New message       Pt c/o swelling: STAT is pt has developed SOB within 24 hours  1. How long have you been experiencing swelling? 1 week---but really bad today 2. Where is the swelling located? Feet/ankle/upper legs toward knees  3.  Are you currently taking a "fluid pill"? 80mg  lasix daily 4.  Are you currently SOB?  no  5.  Have you traveled recently? Daughter thinks pt needs to be seen today and not wait until Friday.  Please call

## 2014-09-05 NOTE — Telephone Encounter (Signed)
Patient's daughter, [name]) called concerned about her mother's legs that are swollen and tight today.  Feels she needs to be seen today instead of waiting until Friday..  Seems a little more shortness of breath with nausea .  Up earlier than normal today and her appetite has been poor.  Told daughter we do have an appointment for 11:50am tomorrow morning with Richardson Dopp PA. Advised  if she were to get more short of breath  and appears more uncomfortable to  call 9-1-1- and go to the hospital.  Daughter aware of appointment tomorrow.

## 2014-09-06 ENCOUNTER — Ambulatory Visit (INDEPENDENT_AMBULATORY_CARE_PROVIDER_SITE_OTHER): Payer: Medicare Other | Admitting: Physician Assistant

## 2014-09-06 ENCOUNTER — Encounter: Payer: Self-pay | Admitting: Physician Assistant

## 2014-09-06 VITALS — BP 120/52 | HR 59 | Ht 64.0 in | Wt 175.0 lb

## 2014-09-06 DIAGNOSIS — I5033 Acute on chronic diastolic (congestive) heart failure: Secondary | ICD-10-CM | POA: Diagnosis not present

## 2014-09-06 DIAGNOSIS — N189 Chronic kidney disease, unspecified: Secondary | ICD-10-CM | POA: Diagnosis not present

## 2014-09-06 DIAGNOSIS — I350 Nonrheumatic aortic (valve) stenosis: Secondary | ICD-10-CM | POA: Diagnosis not present

## 2014-09-06 DIAGNOSIS — I10 Essential (primary) hypertension: Secondary | ICD-10-CM

## 2014-09-06 DIAGNOSIS — I5032 Chronic diastolic (congestive) heart failure: Secondary | ICD-10-CM | POA: Diagnosis not present

## 2014-09-06 DIAGNOSIS — Z66 Do not resuscitate: Secondary | ICD-10-CM

## 2014-09-06 DIAGNOSIS — I482 Chronic atrial fibrillation, unspecified: Secondary | ICD-10-CM

## 2014-09-06 DIAGNOSIS — I6523 Occlusion and stenosis of bilateral carotid arteries: Secondary | ICD-10-CM

## 2014-09-06 DIAGNOSIS — J90 Pleural effusion, not elsewhere classified: Secondary | ICD-10-CM

## 2014-09-06 DIAGNOSIS — R42 Dizziness and giddiness: Secondary | ICD-10-CM

## 2014-09-06 MED ORDER — CARVEDILOL 3.125 MG PO TABS: 3.1250 mg | ORAL_TABLET | Freq: Every day | ORAL | Status: DC

## 2014-09-06 NOTE — Patient Instructions (Signed)
Medication Instructions:  1. CHANGE COREG TO 3.125 MG IN THE MORNING ONLY  2. INCREASE LASIX TO 80 MG IN THE AM AND 40 MG IN THE PM FOR 2 DAYS THEN GO BACK TO YOUR REGULAR DOSE OF LASIX 80 MG DAILY  Labwork: 09/08/14 BMET WHEN YOU SEE [name]  Testing/Procedures: NONE  Follow-Up: KEEP YOUR APPT WITH [name] 09/08/14  Any Other Special Instructions Will Be Listed Below (If Applicable).

## 2014-09-08 ENCOUNTER — Encounter: Payer: Self-pay | Admitting: Thoracic Surgery (Cardiothoracic Vascular Surgery)

## 2014-09-08 ENCOUNTER — Other Ambulatory Visit: Payer: Self-pay

## 2014-09-08 ENCOUNTER — Institutional Professional Consult (permissible substitution) (INDEPENDENT_AMBULATORY_CARE_PROVIDER_SITE_OTHER): Payer: Medicare Other | Admitting: Thoracic Surgery (Cardiothoracic Vascular Surgery)

## 2014-09-08 ENCOUNTER — Ambulatory Visit (INDEPENDENT_AMBULATORY_CARE_PROVIDER_SITE_OTHER): Payer: Medicare Other | Admitting: Cardiology

## 2014-09-08 ENCOUNTER — Encounter: Payer: Self-pay | Admitting: Cardiology

## 2014-09-08 VITALS — BP 153/64 | HR 66 | Resp 16 | Ht 63.0 in | Wt 175.8 lb

## 2014-09-08 VITALS — BP 150/76 | HR 58 | Ht 64.0 in | Wt 175.8 lb

## 2014-09-08 DIAGNOSIS — I482 Chronic atrial fibrillation, unspecified: Secondary | ICD-10-CM

## 2014-09-08 DIAGNOSIS — I5033 Acute on chronic diastolic (congestive) heart failure: Secondary | ICD-10-CM

## 2014-09-08 DIAGNOSIS — I6523 Occlusion and stenosis of bilateral carotid arteries: Secondary | ICD-10-CM

## 2014-09-08 DIAGNOSIS — I5032 Chronic diastolic (congestive) heart failure: Secondary | ICD-10-CM | POA: Diagnosis not present

## 2014-09-08 DIAGNOSIS — I350 Nonrheumatic aortic (valve) stenosis: Secondary | ICD-10-CM

## 2014-09-08 DIAGNOSIS — I77810 Thoracic aortic ectasia: Secondary | ICD-10-CM

## 2014-09-08 DIAGNOSIS — I10 Essential (primary) hypertension: Secondary | ICD-10-CM

## 2014-09-08 DIAGNOSIS — J948 Other specified pleural conditions: Secondary | ICD-10-CM

## 2014-09-08 DIAGNOSIS — J90 Pleural effusion, not elsewhere classified: Secondary | ICD-10-CM

## 2014-09-08 LAB — BASIC METABOLIC PANEL
BUN: 41 mg/dL — ABNORMAL HIGH (ref 6–23)
CO2: 25 mEq/L (ref 19–32)
Calcium: 9.1 mg/dL (ref 8.4–10.5)
Chloride: 106 mEq/L (ref 96–112)
Creatinine, Ser: 2.28 mg/dL — ABNORMAL HIGH (ref 0.40–1.20)
GFR: 21.56 mL/min — ABNORMAL LOW (ref >60.00)
Glucose, Bld: 118 mg/dL — ABNORMAL HIGH (ref 70–99)
Potassium: 3.6 mEq/L (ref 3.5–5.1)
Sodium: 140 mEq/L (ref 135–145)

## 2014-09-08 LAB — BRAIN NATRIURETIC PEPTIDE: PRO B NATRI PEPTIDE: 312 pg/mL — AB (ref 0.0–100.0)

## 2014-09-08 MED ORDER — FUROSEMIDE 80 MG PO TABS: 80.0000 mg | ORAL_TABLET | Freq: Two times a day (BID) | ORAL | Status: DC

## 2014-09-08 NOTE — Patient Instructions (Signed)
Medication Instructions:  Your physician has recommended you make the following change in your medication: 1) INCREASE LASIX to 80 mg TWICE DAILY  Labwork: TODAY: BMET, BNP  IN ONE WEEK: BMET  Testing/Procedures: None  Follow-Up: You have been referred to [name] ASAP for evaluation of questionable TAVR.   Your physician recommends that you schedule a follow-up appointment in 2 weeks with an APP.  Your physician recommends that you schedule a follow-up appointment in: 3 months with [name].  Any Other Special Instructions Will Be Listed Below (If Applicable).

## 2014-09-08 NOTE — Progress Notes (Signed)
PCP is FRIED, [name], MD Referring Provider is Juanito Doom, MD  Chief Complaint  Patient presents with  . Pleural Effusion    eval for right pleurX..CT CHEST 08/30/14...has had previous thoracenteses    HPI: 81 year old [name] with a chief complaint of shortness of breath.  Natasha [name] with multiple severe medical problems including obesity, diabetes, chronic kidney disease stage IV, colon cancer, chronic atrial fibrillation, chronic diastolic heart failure, severe aortic stenosis, and anemia. She first began having problems with pleural effusions back in December 2015. She was hospitalized over the holidays with pneumonia area and in February she had cholecystitis and a drain was placed. After that her pleural effusions became more problematic. She has been drained 3 times in the past 3 months with over a liter drained each time. She does get symptomatically relief when the pleural fluid is drained. Her symptoms begin to worsen about a week after each thoracentesis. The fluid has been exudative.  She saw [name] earlier today regarding aortic stenosis. The patient the importance with [name] is referring her for a T AVR.   Past Medical History  Diagnosis Date  . CKD (chronic kidney disease), stage IV     a. Baseline Cr reported 2.5-2.6.  Marland Kitchen Anxiety   . Diabetes mellitus     diabetic retinopathy  . Anemia   . Moderate aortic stenosis     a. By echo 10/2013.  Marland Kitchen Urinary tract infection   . Fall at home   . Colostomy care     colon ca - h/o bloody output from bag 12/2012 (ED visit)  . Colon cancer 30 year ago  . Chronic atrial fibrillation     a. not on anticoagulation due to increased risk of falls, advanced age.  . Chronic diastolic CHF (congestive heart failure)     a. Echo 10/2013: mild LVH, EF 60-65%, mod AS, mildly dilated LA, mod dilated RA, PASP 33.  Marland Kitchen Hypertension   . Carotid disease, bilateral     a. Carotid duplex 06/4008: 27-25% RICA/LICA.   Marland Kitchen H/O cardiovascular stress test     a. 2002: nuc negative for ischemia, EF 65%.  . Shortness of breath dyspnea     Past Surgical History  Procedure Laterality Date  . Abdominal surgery    . Abdominal hysterectomy    . Colon surgery    . Appendectomy    . Colostomy    . Cardioversion  09/30/2011    Procedure: CARDIOVERSION;  Surgeon: [name], MD;  Location: [place];  Service: Cardiovascular;  Laterality: N/A;  . I&d extremity  11/20/2011    Procedure: IRRIGATION AND DEBRIDEMENT EXTREMITY;  Surgeon: Alta Corning, MD;  Location: [place];  Service: Orthopedics;  Laterality: Left;  . Av fistula placement Left 06/02/2012    Procedure: ARTERIOVENOUS (AV) FISTULA CREATION;  Surgeon: Conrad Colstrip, MD;  Location: [place];  Service: Vascular;  Laterality: Left;  Bascilic Fistula  . Bascilic vein transposition Left 08/18/2012    Procedure: BASCILIC VEIN TRANSPOSITION;  Surgeon: Conrad Greenlee, MD;  Location: [place];  Service: Vascular;  Laterality: Left;  Left 2nd stage Basilic Vein Transposition   . Ercp N/A 07/11/2014    Procedure: ENDOSCOPIC RETROGRADE CHOLANGIOPANCREATOGRAPHY (ERCP);  Surgeon: Inda Castle, MD;  Location: [facility];  Service: Endoscopy;  Laterality: N/A;  . Biliary stent placement N/A 07/11/2014    Procedure: BILIARY STENT PLACEMENT;  Surgeon: Inda Castle, MD;  Location: [facility];  Service: Endoscopy;  Laterality: N/A;  . Ercp N/A 07/21/2014    Procedure: ENDOSCOPIC RETROGRADE CHOLANGIOPANCREATOGRAPHY (ERCP);  Surgeon: Inda Castle, MD;  Location: [facility];  Service: Endoscopy;  Laterality: N/A;    Family History  Problem Relation Age of Onset  . Heart disease Father   . Diabetes Sister   . Diabetes Daughter   . Hyperlipidemia Daughter   . Hypertension Daughter   . Other Daughter     varicose veins  . Cancer Son   . Diabetes Son   . Heart disease Son     before age 19  . Hyperlipidemia Son   . Hypertension Son   . Heart attack Father   . Stroke  Father     Social History History  Substance Use Topics  . Smoking status: Never Smoker   . Smokeless tobacco: Current User    Types: Snuff  . Alcohol Use: No    Current Outpatient Prescriptions  Medication Sig Dispense Refill  . acetaminophen (TYLENOL) 500 MG tablet Take 500 mg by mouth every 6 (six) hours as needed for mild pain.     Marland Kitchen aspirin EC 81 MG tablet Take 81 mg by mouth daily.    . calcitRIOL (ROCALTROL) 0.25 MCG capsule Take 0.25 mcg by mouth daily.     . carvedilol (COREG) 3.125 MG tablet Take 1 tablet (3.125 mg total) by mouth daily with breakfast.    . clotrimazole-betamethasone (LOTRISONE) lotion Apply 1 application topically daily as needed (apply to rash on stomach as needed).   0  . docusate sodium (COLACE) 100 MG capsule Take 100 mg by mouth daily as needed for constipation.     Marland Kitchen epoetin alfa (EPOGEN,PROCRIT) 85027 UNIT/ML injection 20,000 Units. Every two weeks    . ferrous sulfate 325 (65 FE) MG tablet Take 1 tablet (325 mg total) by mouth 2 (two) times daily with a meal. 60 tablet 0  . furosemide (LASIX) 80 MG tablet Take 1 tablet (80 mg total) by mouth 2 (two) times daily. 60 tablet 3  . HYDROcodone-acetaminophen (NORCO/VICODIN) 5-325 MG per tablet Take 1 tablet by mouth every 4 (four) hours as needed for moderate pain. 20 tablet 0  . insulin glargine (LANTUS) 100 UNIT/ML injection Inject 8 Units into the skin at bedtime as needed (if cbg over 150).     Marland Kitchen linagliptin (TRADJENTA) 5 MG TABS tablet Take 5 mg by mouth daily.     Marland Kitchen LORazepam (ATIVAN) 0.5 MG tablet Take 0.5 mg by mouth 2 (two) times daily.    . meclizine (ANTIVERT) 12.5 MG tablet Take 12.5 mg by mouth as needed for dizziness.     . mirtazapine (REMERON) 7.5 MG tablet Take 7.5 mg by mouth at bedtime.     . promethazine (PHENERGAN) 12.5 MG tablet Take 1 tablet (12.5 mg total) by mouth every 6 (six) hours as needed for nausea or vomiting. 30 tablet 0  . simvastatin (ZOCOR) 20 MG tablet TAKE 1 TABLET (20  MG TOTAL) BY MOUTH AT BEDTIME. 30 tablet 5  . vitamin B-12 (CYANOCOBALAMIN) 1000 MCG tablet Take 1,000 mcg by mouth daily.     No current facility-administered medications for this visit.    Allergies  Allergen Reactions  . Levaquin [Levofloxacin] Nausea Only  . Codeine Nausea Only  . Keflet [Cephalexin] Nausea Only  . Morphine And Related Nausea Only  . Oxycodone Other (See Comments)    Abnormal behavior    Review of Systems  Constitutional: Positive for  appetite change, fatigue and unexpected weight change (weight gain).  Respiratory: Positive for cough, shortness of breath and wheezing.   Cardiovascular: Positive for leg swelling. Negative for chest pain.       Claudication, orthopnea  Gastrointestinal: Positive for abdominal pain and blood in stool (iron supplement).  Genitourinary: Positive for dysuria and frequency.  Musculoskeletal: Positive for arthralgias.  Neurological: Positive for dizziness.  Hematological: Bruises/bleeds easily.  Psychiatric/Behavioral: Positive for dysphoric mood. The patient is nervous/anxious.     BP 153/64 mmHg  Pulse 66  Resp 16  Ht 5\' 3"  (1.6 m)  Wt 175 lb 12.8 oz (79.742 kg)  BMI 31.15 kg/m2  SpO2 96% Physical Exam  Constitutional: She is oriented to person, place, and time. No distress.  Morbidly obese, ill-appearing  HENT:  Head: Normocephalic and atraumatic.  Eyes: EOM are normal.  Cardiovascular:  Murmur (3/6 crescendo/ decrescendo) heard. Irregular rhythm  Pulmonary/Chest:  Absent BS right base  Abdominal: Soft. There is no tenderness.  Musculoskeletal: She exhibits edema (3+ pitting edema with venous stasis changes).  Lymphadenopathy:    She has no cervical adenopathy.  Neurological: She is alert and oriented to person, place, and time. No cranial nerve deficit.  Skin: Skin is warm and dry.  Vitals reviewed.    Diagnostic Tests: CT CHEST WITHOUT CONTRAST  TECHNIQUE: Multidetector CT imaging of the chest was  performed following the standard protocol without IV contrast.  COMPARISON: Radiographs 08/29/2014. CT 07/14/2014.  FINDINGS: Mediastinum/Nodes: There are no enlarged mediastinal, hilar or axillary lymph nodes. The thyroid gland, trachea and esophagus demonstrate no significant findings. The heart size is normal. There is a small pericardial effusion. There is diffuse atherosclerosis of the aorta, great vessels and coronary arteries.  Lungs/Pleura: Right pleural effusion has decreased in volume following thoracentesis. There is a moderate-sized right basilar hydro pneumothorax with partial collapse of the right middle and lower lobes. The upper lobe remains well expanded. There is no mediastinal shift. There is persistent airspace disease posteriorly in the right middle lobe with volume loss and probable fluid in the right middle lobe bronchus. There is minimal atelectasis at the left lung base. A small left pleural effusion appears unchanged.  Upper abdomen: Unremarkable. There is no adrenal mass.  Musculoskeletal/Chest wall: There is no chest wall mass or suspicious osseous finding. Asymmetric glenohumeral degenerative changes noted on the right.  IMPRESSION: 1. Moderate size loculated hydro pneumothorax on the right following thoracentesis today. Appearance suggests underlying adhesions of the right pleural space. No evidence of tension component. 2. Persistent partial collapse of the right middle lobe with airspace disease and probable fluid in the bronchus. 3. Stable small simple left pleural effusion. 4. Diffuse atherosclerosis. 5. These results were discussed by telephone with Dr. Markus Daft, covering interventional services at [facility] today. He will relay the results to Dr. Simonne Maffucci.   Electronically Signed  By: Richardean Sale M.D.  On: 08/30/2014 13:28   Impression: 81 year old [name] with multiple severe medical problems  including a recurrent right pleural effusion. She has been requiring thoracentesis about once a month over the past several months. Most recent the she was drained on 08/29/2014. A CT the following day showed a trapped right lower lobe with a large space. Despite that she did have symptomatic relief with drainage.  Her shortness of breath is multifactorial given her aortic stenosis, atrial fibrillation, congestive heart failure, and pleural effusions. However as noted above she does get symptomatic relief with thoracentesis.  Pleural catheter is certainly  possible. Unfortunately there is not a good endpoint in her case given the trapped lung. She is highly unlikely to achieve pleural symphysis. She is likely to have recurrent effusions again once the catheter is removed. She is not a candidate for surgical decortication. I explained these issues to her and her daughters.  I described the proposed procedure, which is right pleural catheter placement. They understand the general nature of the procedure. This will be done under local with sedation. They do understand she would have an indwelling device that requires ongoing care. She does already have a home health nurse seeing her. I reviewed the indications, risks, benefits, and alternatives. They understand this will be put in with a defined timeframe of 6-8 weeks. They understand there is a high likelihood of the effusions will continue to recur after catheter removal. They understand the risks include, but are not limited to catheter malposition, bleeding, infection, catheter occlusion. They understand that there is a possibility of infecting the pleural space which could be a potentially fatal complication in her case.  Plan: Right pleural catheter placement on Tuesday, 09/12/2014  Melrose Nakayama, MD [facility] [phone]

## 2014-09-08 NOTE — Progress Notes (Signed)
Cardiology Office Note   Date:  09/09/2014   ID:  [name], DOB [birth year], MRN [id number]  PCP:  Abigail Miyamoto, MD    Chief Complaint  Patient presents with  . Aortic Stenosis  . Atrial Fibrillation  . Congestive Heart Failure      History of Present Illness: [name] is a 81 y.o. female with a hx of diastolic HF, aortic stenosis, chronic atrial fibrillation, HTN, CKD, diabetes, carotid stenosis, colon CA status post right hemicolectomy. She is not on anticoagulation secondary to increased bleeding risk.  Admitted in 04/2014 with acute on chronic diastolic CHF with bilateral pleural effusions in the setting of acalculus acute cholecystitis treated with percutaneous cholecystotomy tube and ERCP with stone extraction.  Admitted 4/22-5/2 with acute hypoxic respiratory failure in the setting of acute on chronic diastolic CHF complicated by right exudative pleural effusion. She underwent thoracentesis 2. Cytology was negative for malignant cells. Culture was negative. She was followed by critical care. Recurrent pleural effusion was felt to be related to her gallbladder process (parapneumonic). She has been followed closely by pulmonology. She had repeat thoracentesis due to recurrent effusion. This was complicated by pleural thickening and a pneumothorax ex vacuo after the procedure. She has been referred to thoracic surgery for evaluation for Pleurx catheter and she is seeing them later today.  She recently underwent repeat echocardiogram 08/23/14 for routine follow-up of her aortic stenosis. Aortic stenosis was noted to be severe with a mean gradient of 34 mmHg, peak velocity of 390 cm/sec and AVA calculated at .88cm2.     She was recently seen by Natasha Dopp, PA and had noticed a 4 lb weight gain as well as increasing LE edema. She denied orthopnea or PND.   He increased her Lasix to 80mg  qam and 40mg  qpm for 2 days.  She has not lost any weight on this  regimen and her LE edema is still present as well as SOB.  She says that she is chronically SOB but seems worse recently.  She describes NYHA 3 symptoms. She denies chest discomfort. She denies syncope. She does note some dizziness in the AM. She has an occasional NP cough. No wheezing.    Past Medical History  Diagnosis Date  . CKD (chronic kidney disease), stage IV     a. Baseline Cr reported 2.5-2.6.  Marland Kitchen Anxiety   . Diabetes mellitus     diabetic retinopathy  . Anemia   . Moderate aortic stenosis     a. By echo 10/2013.  Marland Kitchen Urinary tract infection   . Fall at home   . Colostomy care     colon ca - h/o bloody output from bag 12/2012 (ED visit)  . Colon cancer 30 year ago  . Chronic atrial fibrillation     a. not on anticoagulation due to increased risk of falls, advanced age.  . Chronic diastolic CHF (congestive heart failure)     a. Echo 10/2013: mild LVH, EF 60-65%, mod AS, mildly dilated LA, mod dilated RA, PASP 33.  Marland Kitchen Hypertension   . Carotid disease, bilateral     a. Carotid duplex 0/3559: 74-16% RICA/LICA.  Marland Kitchen H/O cardiovascular stress test     a. 2002: nuc negative for ischemia, EF 65%.  . Shortness of breath dyspnea     Past Surgical History  Procedure Laterality Date  . Abdominal surgery    .  Abdominal hysterectomy    . Colon surgery    . Appendectomy    . Colostomy    . Cardioversion  09/30/2011    Procedure: CARDIOVERSION;  Surgeon: [name], MD;  Location: [place];  Service: Cardiovascular;  Laterality: N/A;  . I&d extremity  11/20/2011    Procedure: IRRIGATION AND DEBRIDEMENT EXTREMITY;  Surgeon: Alta Corning, MD;  Location: [place];  Service: Orthopedics;  Laterality: Left;  . Av fistula placement Left 06/02/2012    Procedure: ARTERIOVENOUS (AV) FISTULA CREATION;  Surgeon: Conrad Smithland, MD;  Location: [place];  Service: Vascular;  Laterality: Left;  Bascilic Fistula  . Bascilic vein transposition Left 08/18/2012    Procedure: BASCILIC VEIN TRANSPOSITION;  Surgeon:  Conrad Loraine, MD;  Location: [place];  Service: Vascular;  Laterality: Left;  Left 2nd stage Basilic Vein Transposition   . Ercp N/A 07/11/2014    Procedure: ENDOSCOPIC RETROGRADE CHOLANGIOPANCREATOGRAPHY (ERCP);  Surgeon: Inda Castle, MD;  Location: [facility];  Service: Endoscopy;  Laterality: N/A;  . Biliary stent placement N/A 07/11/2014    Procedure: BILIARY STENT PLACEMENT;  Surgeon: Inda Castle, MD;  Location: [facility];  Service: Endoscopy;  Laterality: N/A;  . Ercp N/A 07/21/2014    Procedure: ENDOSCOPIC RETROGRADE CHOLANGIOPANCREATOGRAPHY (ERCP);  Surgeon: Inda Castle, MD;  Location: [facility];  Service: Endoscopy;  Laterality: N/A;     Current Outpatient Prescriptions  Medication Sig Dispense Refill  . acetaminophen (TYLENOL) 500 MG tablet Take 500 mg by mouth every 6 (six) hours as needed for mild pain.     Marland Kitchen aspirin EC 81 MG tablet Take 81 mg by mouth daily.    . calcitRIOL (ROCALTROL) 0.25 MCG capsule Take 0.25 mcg by mouth daily.     . carvedilol (COREG) 3.125 MG tablet Take 1 tablet (3.125 mg total) by mouth daily with breakfast.    . clotrimazole-betamethasone (LOTRISONE) lotion Apply 1 application topically daily as needed (apply to rash on stomach as needed).   0  . docusate sodium (COLACE) 100 MG capsule Take 100 mg by mouth daily as needed for constipation.     Marland Kitchen epoetin alfa (EPOGEN,PROCRIT) 82423 UNIT/ML injection 20,000 Units. Every two weeks    . ferrous sulfate 325 (65 FE) MG tablet Take 1 tablet (325 mg total) by mouth 2 (two) times daily with a meal. 60 tablet 0  . furosemide (LASIX) 80 MG tablet Take 1 tablet (80 mg total) by mouth 2 (two) times daily. 60 tablet 3  . HYDROcodone-acetaminophen (NORCO/VICODIN) 5-325 MG per tablet Take 1 tablet by mouth every 4 (four) hours as needed for moderate pain. 20 tablet 0  . insulin glargine (LANTUS) 100 UNIT/ML injection Inject 8 Units into the skin at bedtime as needed (if cbg over 150).     Marland Kitchen linagliptin  (TRADJENTA) 5 MG TABS tablet Take 5 mg by mouth daily.     Marland Kitchen LORazepam (ATIVAN) 0.5 MG tablet Take 0.5 mg by mouth 2 (two) times daily.    . meclizine (ANTIVERT) 12.5 MG tablet Take 12.5 mg by mouth as needed for dizziness.     . mirtazapine (REMERON) 7.5 MG tablet Take 7.5 mg by mouth at bedtime.     . promethazine (PHENERGAN) 12.5 MG tablet Take 1 tablet (12.5 mg total) by mouth every 6 (six) hours as needed for nausea or vomiting. 30 tablet 0  . simvastatin (ZOCOR) 20 MG tablet TAKE 1 TABLET (20 MG TOTAL) BY MOUTH AT BEDTIME. 30 tablet  5  . vitamin B-12 (CYANOCOBALAMIN) 1000 MCG tablet Take 1,000 mcg by mouth daily.     No current facility-administered medications for this visit.    Allergies:   Levaquin; Codeine; Keflet; Morphine and related; and Oxycodone    Social History:  The patient  reports that she has never smoked. Her smokeless tobacco use includes Snuff. She reports that she does not drink alcohol or use illicit drugs.   Family History:  The patient's family history includes Cancer in her son; Diabetes in her daughter, sister, and son; Heart attack in her father; Heart disease in her father and son; Hyperlipidemia in her daughter and son; Hypertension in her daughter and son; Other in her daughter; Stroke in her father.    ROS:  Please see the history of present illness.   Otherwise, review of systems are positive for none.   All other systems are reviewed and negative.    PHYSICAL EXAM: VS:  BP 150/76 mmHg  Pulse 58  Ht 5\' 4"  (1.626 m)  Wt 175 lb 12.8 oz (79.742 kg)  BMI 30.16 kg/m2  SpO2 96% , BMI Body mass index is 30.16 kg/(m^2). GEN: Well nourished, well developed, in no acute distress HEENT: normal Neck: no JVD, carotid bruits, or masses Cardiac: RRR; no rubs, or gallops.  3+ edema bilaterally.  2/6 late peaking systolic murmur at RUSB to LLSB with loss of A2 component of second heart sound  Respiratory:  clear to auscultation bilaterally, normal work of  breathing GI: soft, nontender, nondistended, + BS MS: no deformity or atrophy Skin: warm and dry, no rash Neuro:  Strength and sensation are intact Psych: euthymic mood, full affect   EKG:  EKG is not ordered today.    Recent Labs: 05/14/2014: Magnesium 2.2 07/14/2014: B Natriuretic Peptide 526.1* 07/17/2014: TSH 0.935 08/29/2014: ALT 3; Platelets 142.0* 09/01/2014: Hemoglobin 9.4* 09/08/2014: BUN 41*; Creatinine, Ser 2.28*; Potassium 3.6; Pro B Natriuretic peptide (BNP) 312.0*; Sodium 140    Lipid Panel    Component Value Date/Time   CHOL 87 07/14/2014 1716   TRIG 136.0 06/15/2014 1013   HDL 36.30* 06/15/2014 1013   CHOLHDL 3 06/15/2014 1013   VLDL 27.2 06/15/2014 1013   LDLCALC 29 06/15/2014 1013      Wt Readings from Last 3 Encounters:  09/08/14 175 lb 12.8 oz (79.742 kg)  09/08/14 175 lb 12.8 oz (79.742 kg)  09/06/14 175 lb (79.379 kg)    ASSESSMENT AND PLAN: 1. Chronic diastolic CHF - her weight has not changed despite increasing Lasix for 2 days.  She is still SOB and LE edema persists.  I suspect her CHF is exacerbated by her CKD and severe AS.  I have instructed her to increase her lasix to 80mg  BID.  I will recheck a BMET in 1 week.  Followup with APP in 2 weeks. 2. Severe AS by recent echo as well as by exam.  She is not a candidate for surgical repair and only option would be TAVR.  I has discussed this at length with Stanton Kidney and her family.  She is at increased risk even with this procedure which would require some contrast and therefore increase her risk of worsening renal function.  I have recommended referring her to [name] for evaluation to determine if she is even a candidate. 3. Chronic atrial fibrillation not a coumadin candidate. Rate controlled - continue carvedilol/ASA 4. HTN - borderline controlled - continue carvedilol  5. Chronic LE edema - persists 3+ despite recent  transient increase in does.  She does not use table salt.   6. Mildly dilated aortic  root  7. Carotid artery stenosis - mild - recheck carotid dopplers in 1 year. Continue ASA/statin. I will get a copy of her most recent lipids by her PCP 8. Chronic kidney disease stage IV followed by Renal    Current medicines are reviewed at length with the patient today.  The patient does not have concerns regarding medicines.  The following changes have been made:  no change  Labs/ tests ordered today: See above Assessment and Plan  Orders Placed This Encounter  Procedures  . Basic Metabolic Panel (BMET)  . B Nat Peptide  . Basic Metabolic Panel (BMET)     Disposition:   FU with me in 3 months  Signed, [name], MD  09/09/2014 5:42 PM    [facility] [address], [place], [place]  [address] Phone: [phone]; Fax: [phone]

## 2014-09-11 ENCOUNTER — Telehealth: Payer: Self-pay

## 2014-09-11 ENCOUNTER — Encounter (HOSPITAL_COMMUNITY): Payer: Self-pay | Admitting: Emergency Medicine

## 2014-09-11 MED ORDER — MUPIROCIN 2 % EX OINT
1.0000 "application " | TOPICAL_OINTMENT | Freq: Once | CUTANEOUS | Status: AC
  Administered 2014-09-12: 1 via TOPICAL
  Filled 2014-09-11: qty 22

## 2014-09-11 MED ORDER — VANCOMYCIN HCL IN DEXTROSE 1-5 GM/200ML-% IV SOLN
1000.0000 mg | INTRAVENOUS | Status: AC
  Administered 2014-09-12: 1000 mg via INTRAVENOUS

## 2014-09-11 NOTE — Progress Notes (Signed)
SDW pre-op call completed by both pt and pt daughter [name] ( with consent). Pt denies SOB and chest pain but is under the care of Dr. Fransico Him , cardiology. Pt made aware to not take any diabetic medication the morning of surgery such as Tradjenta. Pt made aware to stop taking NSAID's, otc vitamins and herbal medications. Levada Dy, NP, anesthesia,  asked to review pt cardiac history. Pt and daughter verbalized understanding of all pre-op instructions.

## 2014-09-11 NOTE — Telephone Encounter (Signed)
-----   Message from [name], MD sent at 09/09/2014  9:03 PM EDT ----- Renal function improved and BNP is elevated. Lasix increased at [place] 6/17 - please forward these labs to her nephrologist and let them know we increased Lasix to 80mg  BID - make sure she has a BMET pending in 1 week

## 2014-09-11 NOTE — Progress Notes (Signed)
   09/11/14 1427  OBSTRUCTIVE SLEEP APNEA  Have you ever been diagnosed with sleep apnea through a sleep study? No  Do you snore loudly (loud enough to be heard through closed doors)?  1  Do you often feel tired, fatigued, or sleepy during the daytime? 1  Has anyone observed you stop breathing during your sleep? 0  Do you have, or are you being treated for high blood pressure? 1  BMI more than 35 kg/m2? 0  Age over 79 years old? 1  Gender: 0  Obstructive Sleep Apnea Score 4

## 2014-09-11 NOTE — Telephone Encounter (Signed)
Informed patient of results and verbal understanding expressed.  BMET scheduled 6/24.  Lab work forwarded to Dr. Moshe Cipro.

## 2014-09-11 NOTE — Progress Notes (Signed)
Anesthesia Chart Review:  Pt is 81 year old female scheduled for insertion of R pleural drainage catheter on 09/12/2014 with Dr. Roxan Hockey.  Cardiologist is Fransico Him, last office visit 09/08/2014  PMH includes: Severe aortic stenosis (by echo 08/23/14, is being referred for TAVR), chronic atrial fibrillation, chronic diastolic CHF, HTN, DM, CKD, colon cancer, anemia. Never smoker. BMI 31.   Medications include: ASA, carvedilol, epogen, lasix, insulin, linagliptin, remeron  BMET from 09/08/14 reviewed. Cr 2.28, BUN 41, which is consistent with pt's CKD. CBC from 08/29/14 reviewed. H/H 9.9/31.3, consistent with previous results. Pt will get lab testing DOS.   EKG 09/06/2014: atrial fibrillation with slow ventricular response. Anterolateral infarct, age undetermined.   Echo 08/23/2014:  - Left ventricle: The cavity size was normal. There was moderateconcentric hypertrophy. Systolic function was vigorous. Theestimated ejection fraction was in the range of 65% to 70%. Wallmotion was normal; there were no regional wall motionabnormalities. - Aortic valve: Valve mobility was severely restricted. There wassevere stenosis. (Valve area (VTI): 0.88 cm^2. Indexed valve area (VTI): 0.46 cm^2/m^2. Peak velocity ratio of LVOT to aortic valve: 0.21. Valve area (Vmax): 0.77 cm^2. Indexed valve area (Vmax): 0.41 cm^2/m^2. Mean velocity ratio of LVOT to aortic valve: 0.2. Valve area (Vmean): 0.72 cm^2. Indexed valve area (Vmean): 0.38 cm^2/m^2.  Mean gradient (S): 34 mm Hg. Peak gradient (S): 61 mm Hg.) - Mitral valve: Calcified annulus. - Left atrium: The atrium was mildly dilated. - Pulmonary arteries: Systolic pressure was mildly increased. PApeak pressure: 34 mm Hg (S). - Pericardium, extracardiac: A trivial pericardial effusion was identified posterior to the heart.  Carotid duplex US 08/23/2014: 1-39% bilateral carotid artery stenosis - repeat scan in 1 year  If no changes, I anticipate pt can proceed  with surgery as scheduled.   Willeen Cass, FNP-BC [facility]/Anesthesiology Phone: [phone] 09/11/2014 11:58 AM

## 2014-09-12 ENCOUNTER — Ambulatory Visit (HOSPITAL_COMMUNITY): Payer: Medicare Other | Admitting: Emergency Medicine

## 2014-09-12 ENCOUNTER — Encounter (HOSPITAL_COMMUNITY)
Admission: RE | Disposition: A | Discharge status: Home / Self Care | Payer: Self-pay | Source: Ambulatory Visit | Attending: Thoracic Surgery (Cardiothoracic Vascular Surgery)

## 2014-09-12 ENCOUNTER — Ambulatory Visit (HOSPITAL_COMMUNITY)
Admission: RE | Admit: 2014-09-12 | Discharge: 2014-09-12 | Disposition: A | Discharge status: Home / Self Care | Payer: Medicare Other | Source: Ambulatory Visit | Attending: Thoracic Surgery (Cardiothoracic Vascular Surgery) | Admitting: Thoracic Surgery (Cardiothoracic Vascular Surgery)

## 2014-09-12 ENCOUNTER — Ambulatory Visit (HOSPITAL_COMMUNITY): Payer: Medicare Other

## 2014-09-12 ENCOUNTER — Encounter (HOSPITAL_COMMUNITY): Payer: Self-pay | Admitting: *Deleted

## 2014-09-12 DIAGNOSIS — E039 Hypothyroidism, unspecified: Secondary | ICD-10-CM | POA: Diagnosis not present

## 2014-09-12 DIAGNOSIS — I5032 Chronic diastolic (congestive) heart failure: Secondary | ICD-10-CM | POA: Diagnosis not present

## 2014-09-12 DIAGNOSIS — E6609 Other obesity due to excess calories: Secondary | ICD-10-CM | POA: Insufficient documentation

## 2014-09-12 DIAGNOSIS — J90 Pleural effusion, not elsewhere classified: Secondary | ICD-10-CM | POA: Insufficient documentation

## 2014-09-12 DIAGNOSIS — M1990 Unspecified osteoarthritis, unspecified site: Secondary | ICD-10-CM | POA: Diagnosis not present

## 2014-09-12 DIAGNOSIS — I482 Chronic atrial fibrillation: Secondary | ICD-10-CM | POA: Insufficient documentation

## 2014-09-12 DIAGNOSIS — I129 Hypertensive chronic kidney disease with stage 1 through stage 4 chronic kidney disease, or unspecified chronic kidney disease: Secondary | ICD-10-CM | POA: Diagnosis not present

## 2014-09-12 DIAGNOSIS — Z794 Long term (current) use of insulin: Secondary | ICD-10-CM | POA: Diagnosis not present

## 2014-09-12 DIAGNOSIS — Z7982 Long term (current) use of aspirin: Secondary | ICD-10-CM | POA: Diagnosis not present

## 2014-09-12 DIAGNOSIS — N184 Chronic kidney disease, stage 4 (severe): Secondary | ICD-10-CM | POA: Insufficient documentation

## 2014-09-12 DIAGNOSIS — Z85038 Personal history of other malignant neoplasm of large intestine: Secondary | ICD-10-CM | POA: Insufficient documentation

## 2014-09-12 HISTORY — DX: Headache: R51

## 2014-09-12 HISTORY — PX: CHEST TUBE INSERTION: SHX231

## 2014-09-12 HISTORY — DX: Headache, unspecified: R51.9

## 2014-09-12 HISTORY — DX: Pleural effusion, not elsewhere classified: J90

## 2014-09-12 HISTORY — DX: Nonrheumatic aortic (valve) stenosis: I35.0

## 2014-09-12 HISTORY — PX: PLEURAL EFFUSION DRAINAGE: SHX5099

## 2014-09-12 HISTORY — DX: Unspecified osteoarthritis, unspecified site: M19.90

## 2014-09-12 LAB — GLUCOSE, CAPILLARY
Glucose-Capillary: 119 mg/dL — ABNORMAL HIGH (ref 65–99)
Glucose-Capillary: 129 mg/dL — ABNORMAL HIGH (ref 65–99)

## 2014-09-12 LAB — COMPREHENSIVE METABOLIC PANEL
ALT: 7 U/L — AB (ref 14–54)
AST: 15 U/L (ref 15–41)
Albumin: 2.7 g/dL — ABNORMAL LOW (ref 3.5–5.0)
Alkaline Phosphatase: 109 U/L (ref 38–126)
Anion gap: 7 (ref 5–15)
BUN: 37 mg/dL — AB (ref 6–20)
CHLORIDE: 108 mmol/L (ref 101–111)
CO2: 27 mmol/L (ref 22–32)
Calcium: 8.9 mg/dL (ref 8.9–10.3)
Creatinine, Ser: 2.48 mg/dL — ABNORMAL HIGH (ref 0.44–1.00)
GFR calc Af Amer: 19 mL/min — ABNORMAL LOW (ref >60)
GFR calc non Af Amer: 17 mL/min — ABNORMAL LOW (ref >60)
Glucose, Bld: 130 mg/dL — ABNORMAL HIGH (ref 65–99)
POTASSIUM: 4 mmol/L (ref 3.5–5.1)
SODIUM: 142 mmol/L (ref 135–145)
Total Bilirubin: 0.5 mg/dL (ref 0.3–1.2)
Total Protein: 5.5 g/dL — ABNORMAL LOW (ref 6.5–8.1)

## 2014-09-12 LAB — CBC
HCT: 32.4 % — ABNORMAL LOW (ref 36.0–46.0)
HEMOGLOBIN: 9.9 g/dL — AB (ref 12.0–15.0)
MCH: 28.4 pg (ref 26.0–34.0)
MCHC: 30.6 g/dL (ref 30.0–36.0)
MCV: 92.8 fL (ref 78.0–100.0)
Platelets: 157 10*3/uL (ref 150–400)
RBC: 3.49 MIL/uL — ABNORMAL LOW (ref 3.87–5.11)
RDW: 16.5 % — ABNORMAL HIGH (ref 11.5–15.5)
WBC: 8.3 10*3/uL (ref 4.0–10.5)

## 2014-09-12 LAB — SURGICAL PCR SCREEN
MRSA, PCR: NEGATIVE
Staphylococcus aureus: NEGATIVE

## 2014-09-12 LAB — APTT: APTT: 29 s (ref 24–37)

## 2014-09-12 LAB — PROTIME-INR
INR: 1.29 (ref 0.00–1.49)
PROTHROMBIN TIME: 16.2 s — AB (ref 11.6–15.2)

## 2014-09-12 SURGERY — INSERTION, PLEURAL DRAINAGE CATHETER
Anesthesia: Monitor Anesthesia Care | Site: Chest | Laterality: Right

## 2014-09-12 MED ORDER — LIDOCAINE HCL 1 % IJ SOLN
INTRAMUSCULAR | Status: DC | PRN
  Administered 2014-09-12: 30 mL

## 2014-09-12 MED ORDER — LIDOCAINE HCL (PF) 1 % IJ SOLN
INTRAMUSCULAR | Status: AC
  Filled 2014-09-12: qty 30

## 2014-09-12 MED ORDER — TRAMADOL HCL 50 MG PO TABS: 50.0000 mg | ORAL_TABLET | Freq: Four times a day (QID) | ORAL | Status: DC | PRN

## 2014-09-12 MED ORDER — ONDANSETRON HCL 4 MG/2ML IJ SOLN
INTRAMUSCULAR | Status: DC | PRN
  Administered 2014-09-12: 4 mg via INTRAVENOUS

## 2014-09-12 MED ORDER — PROPOFOL INFUSION 10 MG/ML OPTIME
INTRAVENOUS | Status: DC | PRN
  Administered 2014-09-12: 50 ug/kg/min via INTRAVENOUS

## 2014-09-12 MED ORDER — FENTANYL CITRATE (PF) 250 MCG/5ML IJ SOLN
INTRAMUSCULAR | Status: AC
  Filled 2014-09-12: qty 5

## 2014-09-12 MED ORDER — 0.9 % SODIUM CHLORIDE (POUR BTL) OPTIME
TOPICAL | Status: DC | PRN
  Administered 2014-09-12: 1000 mL

## 2014-09-12 MED ORDER — FENTANYL CITRATE (PF) 100 MCG/2ML IJ SOLN
INTRAMUSCULAR | Status: DC | PRN
  Administered 2014-09-12: 25 ug via INTRAVENOUS
  Administered 2014-09-12: 50 ug via INTRAVENOUS

## 2014-09-12 MED ORDER — SODIUM CHLORIDE 0.9 % IV SOLN
INTRAVENOUS | Status: DC | PRN
  Administered 2014-09-12: 07:00:00 via INTRAVENOUS

## 2014-09-12 MED ORDER — CARVEDILOL 3.125 MG PO TABS
3.1250 mg | ORAL_TABLET | Freq: Two times a day (BID) | ORAL | Status: DC
Start: 2014-09-12 — End: 2014-09-22

## 2014-09-12 MED ORDER — FENTANYL CITRATE (PF) 100 MCG/2ML IJ SOLN: 25.0000 ug | INTRAMUSCULAR | Status: DC | PRN

## 2014-09-12 SURGICAL SUPPLY — 29 items
ADH SKN CLS APL DERMABOND .7 (GAUZE/BANDAGES/DRESSINGS) ×1
CANISTER SUCTION 2500CC (MISCELLANEOUS) ×3 IMPLANT
COVER SURGICAL LIGHT HANDLE (MISCELLANEOUS) ×3 IMPLANT
DERMABOND ADVANCED (GAUZE/BANDAGES/DRESSINGS) ×2
DERMABOND ADVANCED .7 DNX12 (GAUZE/BANDAGES/DRESSINGS) ×1 IMPLANT
DRAPE C-ARM 42X72 X-RAY (DRAPES) ×3 IMPLANT
DRAPE LAPAROSCOPIC ABDOMINAL (DRAPES) ×3 IMPLANT
GLOVE BIO SURGEON STRL SZ 6 (GLOVE) ×4 IMPLANT
GLOVE SURG SIGNA 7.5 PF LTX (GLOVE) ×3 IMPLANT
GOWN STRL REUS W/ TWL LRG LVL3 (GOWN DISPOSABLE) ×1 IMPLANT
GOWN STRL REUS W/ TWL XL LVL3 (GOWN DISPOSABLE) ×1 IMPLANT
GOWN STRL REUS W/TWL LRG LVL3 (GOWN DISPOSABLE) ×6
GOWN STRL REUS W/TWL XL LVL3 (GOWN DISPOSABLE) ×3
KIT BASIN OR (CUSTOM PROCEDURE TRAY) ×3 IMPLANT
KIT PLEURX DRAIN CATH 1000ML (MISCELLANEOUS) ×3 IMPLANT
KIT PLEURX DRAIN CATH 15.5FR (DRAIN) ×5 IMPLANT
KIT ROOM TURNOVER OR (KITS) ×3 IMPLANT
NS IRRIG 1000ML POUR BTL (IV SOLUTION) ×3 IMPLANT
PACK GENERAL/GYN (CUSTOM PROCEDURE TRAY) ×3 IMPLANT
PAD ARMBOARD 7.5X6 YLW CONV (MISCELLANEOUS) ×6 IMPLANT
SET DRAINAGE LINE (MISCELLANEOUS) IMPLANT
SPONGE GAUZE 4X4 12PLY STER LF (GAUZE/BANDAGES/DRESSINGS) ×2 IMPLANT
SUT ETHILON 3 0 FSL (SUTURE) ×3 IMPLANT
SUT VIC AB 3-0 X1 27 (SUTURE) ×3 IMPLANT
TAPE CLOTH SURG 4X10 WHT LF (GAUZE/BANDAGES/DRESSINGS) ×2 IMPLANT
TOWEL OR 17X24 6PK STRL BLUE (TOWEL DISPOSABLE) ×3 IMPLANT
TOWEL OR 17X26 10 PK STRL BLUE (TOWEL DISPOSABLE) ×3 IMPLANT
VALVE REPLACEMENT CAP (MISCELLANEOUS) IMPLANT
WATER STERILE IRR 1000ML POUR (IV SOLUTION) ×3 IMPLANT

## 2014-09-12 NOTE — Interval H&P Note (Signed)
History and Physical Interval Note:  09/12/2014 7:13 AM  [name]  has presented today for surgery, with the diagnosis of R PLEURAL EFFUSION  The various methods of treatment have been discussed with the patient and family. After consideration of risks, benefits and other options for treatment, the patient has consented to  Procedure(s): INSERTION PLEURAL DRAINAGE CATHETER (Right) as a surgical intervention .  The patient's history has been reviewed, patient examined, no change in status, stable for surgery.  I have reviewed the patient's chart and labs.  Questions were answered to the patient's satisfaction.     Melrose Nakayama

## 2014-09-12 NOTE — Progress Notes (Signed)
[name] J. Natasha Laming, RN, BSN, [employer] [phone] Spoke with pt at bedside regarding discharge planning for [facility]. Offered pt list of home health agencies to choose from.  Pt chose [facility] to render services. Santiago Glad, RN of [facility] notified.   DME needs identified at this time include 45 PleurX Drainage Kit- which is a bedside.

## 2014-09-12 NOTE — Discharge Instructions (Addendum)
Do not drive or engage in heavy physical activity for 24 hours  You may shower tomorrow. Leave dressing in place when showering  We will arrange for your home health nurse to drain the catheter  You have prescription for a pain medication- tramadol, you may use Tylenol in addition to or instead of the tramadol if you wish  Call [phone] if you develop chest pain, shortness of breath, fever > 101 or notice drainage from the surgical site or catheter  My office will contact you with follow up information

## 2014-09-12 NOTE — Care Management Note (Signed)
Case Management Note  Patient Details  Name: [name] MRN: [id number] Date of Birth: 07/16/1925  Subjective/Objective:         81 y.o. female PRE-OPERATIVE DIAGNOSIS: RIGHT PLEURAL EFFUSION  //   Home with daughter      Action/Plan: PROCEDURE: Procedure(s): INSERTION PLEURAL DRAINAGE CATHETER (Right)//Access for [facility] services.  Expected Discharge Date:    09/12/14              Expected Discharge Plan:  [place]  In-House Referral:  NA  Discharge planning Services  CM Consult  Post Acute Care Choice:  Home Health, Resumption of Svcs/PTA Provider Choice offered to:     DME Arranged:    DME Agency:     HH Arranged:  RN [place] Agency:  [place]  Status of Service:  Completed, signed off  Medicare Important Message Given:    Date Medicare IM Given:    Medicare IM give by:    Date Additional Medicare IM Given:    Additional Medicare Important Message give by:     If discussed at [place] of Stay Meetings, dates discussed:    Additional Comments: Pt currently active with [facility] for services.  Resumption of care requested.  Santiago Glad, RN of [facility] notified.  PleurX written order faxed to [phone] attn Duwayne Heck. Fuller Mandril, RN 09/12/2014, 9:54 AM

## 2014-09-12 NOTE — Brief Op Note (Signed)
09/12/2014  8:45 AM  PATIENT:  Lady Deutscher  80 y.o. female  PRE-OPERATIVE DIAGNOSIS:  RIGHT PLEURAL EFFUSION  POST-OPERATIVE DIAGNOSIS:  RIGHT PLEURAL EFFUSION  PROCEDURE:  Procedure(s): INSERTION PLEURAL DRAINAGE CATHETER (Right)  SURGEON:  Surgeon(s) and Role:    * Melrose Nakayama, MD - Primary   ANESTHESIA:   local and IV sedation  EBL:     BLOOD ADMINISTERED:none  DRAINS: PLEURAL CATHETER   LOCAL MEDICATIONS USED:  LIDOCAINE  and Amount: 10 ml  SPECIMEN:  Source of Specimen:  PLEURAL FLUID  DISPOSITION OF SPECIMEN:  N/A  COUNTS:  YES  PLAN OF CARE: Discharge to home after PACU  PATIENT DISPOSITION:  PACU - hemodynamically stable.   Delay start of Pharmacological VTE agent (>24hrs) due to surgical blood loss or risk of bleeding: not applicable  235 ml of serous fluid drained

## 2014-09-12 NOTE — Anesthesia Postprocedure Evaluation (Signed)
  Anesthesia Post-op Note  Patient: [name]  Procedure(s) Performed: Procedure(s): INSERTION PLEURAL DRAINAGE CATHETER (Right)  Patient Location: PACU  Anesthesia Type: MAC  Level of Consciousness: awake and alert   Airway and Oxygen Therapy: Patient Spontanous Breathing  Post-op Pain: none  Post-op Assessment: Post-op Vital signs reviewed, Patient's Cardiovascular Status Stable and Respiratory Function Stable  Post-op Vital Signs: Reviewed  Filed Vitals:   09/12/14 1008  BP: 117/60  Pulse: 57  Temp:   Resp: 20    Complications: No apparent anesthesia complications

## 2014-09-12 NOTE — H&P (View-Only) (Signed)
PCP is FRIED, [name], MD Referring Provider is Juanito Doom, MD  Chief Complaint  Patient presents with  . Pleural Effusion    eval for right pleurX..CT CHEST 08/30/14...has had previous thoracenteses    HPI: 81 year old woman with a chief complaint of shortness of breath.  [name] is an 81 year old woman with multiple severe medical problems including obesity, diabetes, chronic kidney disease stage IV, colon cancer, chronic atrial fibrillation, chronic diastolic heart failure, severe aortic stenosis, and anemia. [name] first began having problems with pleural effusions back in December 2015. [name] was hospitalized over the holidays with pneumonia area and in February [name] had cholecystitis and a drain was placed. After that her pleural effusions became more problematic. [name] has been drained 3 times in the past 3 months with over a liter drained each time. [name] does get symptomatically relief when the pleural fluid is drained. Her symptoms begin to worsen about a week after each thoracentesis. The fluid has been exudative.  [name] saw [name] earlier today regarding aortic stenosis. The patient the importance with [name] is referring her for a T AVR.   Past Medical History  Diagnosis Date  . CKD (chronic kidney disease), stage IV     a. Baseline Cr reported 2.5-2.6.  Marland Kitchen Anxiety   . Diabetes mellitus     diabetic retinopathy  . Anemia   . Moderate aortic stenosis     a. By echo 10/2013.  Marland Kitchen Urinary tract infection   . Fall at home   . Colostomy care     colon ca - h/o bloody output from bag 12/2012 (ED visit)  . Colon cancer 30 year ago  . Chronic atrial fibrillation     a. not on anticoagulation due to increased risk of falls, advanced age.  . Chronic diastolic CHF (congestive heart failure)     a. Echo 10/2013: mild LVH, EF 60-65%, mod AS, mildly dilated LA, mod dilated RA, PASP 33.  Marland Kitchen Hypertension   . Carotid disease, bilateral     a. Carotid duplex 07/4560: 56-38% RICA/LICA.   Marland Kitchen H/O cardiovascular stress test     a. 2002: nuc negative for ischemia, EF 65%.  . Shortness of breath dyspnea     Past Surgical History  Procedure Laterality Date  . Abdominal surgery    . Abdominal hysterectomy    . Colon surgery    . Appendectomy    . Colostomy    . Cardioversion  09/30/2011    Procedure: CARDIOVERSION;  Surgeon: [name], MD;  Location: [place];  Service: Cardiovascular;  Laterality: N/A;  . I&d extremity  11/20/2011    Procedure: IRRIGATION AND DEBRIDEMENT EXTREMITY;  Surgeon: Alta Corning, MD;  Location: [place];  Service: Orthopedics;  Laterality: Left;  . Av fistula placement Left 06/02/2012    Procedure: ARTERIOVENOUS (AV) FISTULA CREATION;  Surgeon: Conrad [place], MD;  Location: [place];  Service: Vascular;  Laterality: Left;  Bascilic Fistula  . Bascilic vein transposition Left 08/18/2012    Procedure: BASCILIC VEIN TRANSPOSITION;  Surgeon: Conrad [place]erville, MD;  Location: [place];  Service: Vascular;  Laterality: Left;  Left 2nd stage Basilic Vein Transposition   . Ercp N/A 07/11/2014    Procedure: ENDOSCOPIC RETROGRADE CHOLANGIOPANCREATOGRAPHY (ERCP);  Surgeon: Inda Castle, MD;  Location: [facility];  Service: Endoscopy;  Laterality: N/A;  . Biliary stent placement N/A 07/11/2014    Procedure: BILIARY STENT PLACEMENT;  Surgeon: Inda Castle, MD;  Location: [facility];  Service: Endoscopy;  Laterality: N/A;  . Ercp N/A 07/21/2014    Procedure: ENDOSCOPIC RETROGRADE CHOLANGIOPANCREATOGRAPHY (ERCP);  Surgeon: Inda Castle, MD;  Location: [facility];  Service: Endoscopy;  Laterality: N/A;    Family History  Problem Relation Age of Onset  . Heart disease Father   . Diabetes Sister   . Diabetes Daughter   . Hyperlipidemia Daughter   . Hypertension Daughter   . Other Daughter     varicose veins  . Cancer Son   . Diabetes Son   . Heart disease Son     before age 86  . Hyperlipidemia Son   . Hypertension Son   . Heart attack Father   . Stroke  Father     Social History History  Substance Use Topics  . Smoking status: Never Smoker   . Smokeless tobacco: Current User    Types: Snuff  . Alcohol Use: No    Current Outpatient Prescriptions  Medication Sig Dispense Refill  . acetaminophen (TYLENOL) 500 MG tablet Take 500 mg by mouth every 6 (six) hours as needed for mild pain.     Marland Kitchen aspirin EC 81 MG tablet Take 81 mg by mouth daily.    . calcitRIOL (ROCALTROL) 0.25 MCG capsule Take 0.25 mcg by mouth daily.     . carvedilol (COREG) 3.125 MG tablet Take 1 tablet (3.125 mg total) by mouth daily with breakfast.    . clotrimazole-betamethasone (LOTRISONE) lotion Apply 1 application topically daily as needed (apply to rash on stomach as needed).   0  . docusate sodium (COLACE) 100 MG capsule Take 100 mg by mouth daily as needed for constipation.     Marland Kitchen epoetin alfa (EPOGEN,PROCRIT) 87564 UNIT/ML injection 20,000 Units. Every two weeks    . ferrous sulfate 325 (65 FE) MG tablet Take 1 tablet (325 mg total) by mouth 2 (two) times daily with a meal. 60 tablet 0  . furosemide (LASIX) 80 MG tablet Take 1 tablet (80 mg total) by mouth 2 (two) times daily. 60 tablet 3  . HYDROcodone-acetaminophen (NORCO/VICODIN) 5-325 MG per tablet Take 1 tablet by mouth every 4 (four) hours as needed for moderate pain. 20 tablet 0  . insulin glargine (LANTUS) 100 UNIT/ML injection Inject 8 Units into the skin at bedtime as needed (if cbg over 150).     Marland Kitchen linagliptin (TRADJENTA) 5 MG TABS tablet Take 5 mg by mouth daily.     Marland Kitchen LORazepam (ATIVAN) 0.5 MG tablet Take 0.5 mg by mouth 2 (two) times daily.    . meclizine (ANTIVERT) 12.5 MG tablet Take 12.5 mg by mouth as needed for dizziness.     . mirtazapine (REMERON) 7.5 MG tablet Take 7.5 mg by mouth at bedtime.     . promethazine (PHENERGAN) 12.5 MG tablet Take 1 tablet (12.5 mg total) by mouth every 6 (six) hours as needed for nausea or vomiting. 30 tablet 0  . simvastatin (ZOCOR) 20 MG tablet TAKE 1 TABLET (20  MG TOTAL) BY MOUTH AT BEDTIME. 30 tablet 5  . vitamin B-12 (CYANOCOBALAMIN) 1000 MCG tablet Take 1,000 mcg by mouth daily.     No current facility-administered medications for this visit.    Allergies  Allergen Reactions  . Levaquin [Levofloxacin] Nausea Only  . Codeine Nausea Only  . Keflet [Cephalexin] Nausea Only  . Morphine And Related Nausea Only  . Oxycodone Other (See Comments)    Abnormal behavior    Review of Systems  Constitutional: Positive for  appetite change, fatigue and unexpected weight change (weight gain).  Respiratory: Positive for cough, shortness of breath and wheezing.   Cardiovascular: Positive for leg swelling. Negative for chest pain.       Claudication, orthopnea  Gastrointestinal: Positive for abdominal pain and blood in stool (iron supplement).  Genitourinary: Positive for dysuria and frequency.  Musculoskeletal: Positive for arthralgias.  Neurological: Positive for dizziness.  Hematological: Bruises/bleeds easily.  Psychiatric/Behavioral: Positive for dysphoric mood. The patient is nervous/anxious.     BP 153/64 mmHg  Pulse 66  Resp 16  Ht 5\' 3"  (1.6 m)  Wt 175 lb 12.8 oz (79.742 kg)  BMI 31.15 kg/m2  SpO2 96% Physical Exam  Constitutional: [name] is oriented to person, place, and time. No distress.  Morbidly obese, ill-appearing  HENT:  Head: Normocephalic and atraumatic.  Eyes: EOM are normal.  Cardiovascular:  Murmur (3/6 crescendo/ decrescendo) heard. Irregular rhythm  Pulmonary/Chest:  Absent BS right base  Abdominal: Soft. There is no tenderness.  Musculoskeletal: [name] exhibits edema (3+ pitting edema with venous stasis changes).  Lymphadenopathy:    [name] has no cervical adenopathy.  Neurological: [name] is alert and oriented to person, place, and time. No cranial nerve deficit.  Skin: Skin is warm and dry.  Vitals reviewed.    Diagnostic Tests: CT CHEST WITHOUT CONTRAST  TECHNIQUE: Multidetector CT imaging of the chest was  performed following the standard protocol without IV contrast.  COMPARISON: Radiographs 08/29/2014. CT 07/14/2014.  FINDINGS: Mediastinum/Nodes: There are no enlarged mediastinal, hilar or axillary lymph nodes. The thyroid gland, trachea and esophagus demonstrate no significant findings. The heart size is normal. There is a small pericardial effusion. There is diffuse atherosclerosis of the aorta, great vessels and coronary arteries.  Lungs/Pleura: Right pleural effusion has decreased in volume following thoracentesis. There is a moderate-sized right basilar hydro pneumothorax with partial collapse of the right middle and lower lobes. The upper lobe remains well expanded. There is no mediastinal shift. There is persistent airspace disease posteriorly in the right middle lobe with volume loss and probable fluid in the right middle lobe bronchus. There is minimal atelectasis at the left lung base. A small left pleural effusion appears unchanged.  Upper abdomen: Unremarkable. There is no adrenal mass.  Musculoskeletal/Chest wall: There is no chest wall mass or suspicious osseous finding. Asymmetric glenohumeral degenerative changes noted on the right.  IMPRESSION: 1. Moderate size loculated hydro pneumothorax on the right following thoracentesis today. Appearance suggests underlying adhesions of the right pleural space. No evidence of tension component. 2. Persistent partial collapse of the right middle lobe with airspace disease and probable fluid in the bronchus. 3. Stable small simple left pleural effusion. 4. Diffuse atherosclerosis. 5. These results were discussed by telephone with Dr. Markus Daft, covering interventional services at [facility] today. He will relay the results to Dr. Simonne Maffucci.   Electronically Signed  By: Richardean Sale M.D.  On: 08/30/2014 13:28   Impression: 81 year old woman with multiple severe medical problems  including a recurrent right pleural effusion. [name] has been requiring thoracentesis about once a month over the past several months. Most recent the [name] was drained on 08/29/2014. A CT the following day showed a trapped right lower lobe with a large space. Despite that [name] did have symptomatic relief with drainage.  Her shortness of breath is multifactorial given her aortic stenosis, atrial fibrillation, congestive heart failure, and pleural effusions. However as noted above [name] does get symptomatic relief with thoracentesis.  Pleural catheter is certainly  possible. Unfortunately there is not a good endpoint in her case given the trapped lung. [name] is highly unlikely to achieve pleural symphysis. [name] is likely to have recurrent effusions again once the catheter is removed. [name] is not a candidate for surgical decortication. I explained these issues to her and her daughters.  I described the proposed procedure, which is right pleural catheter placement. They understand the general nature of the procedure. This will be done under local with sedation. They do understand [name] would have an indwelling device that requires ongoing care. [name] does already have a home health nurse seeing her. I reviewed the indications, risks, benefits, and alternatives. They understand this will be put in with a defined timeframe of 6-8 weeks. They understand there is a high likelihood of the effusions will continue to recur after catheter removal. They understand the risks include, but are not limited to catheter malposition, bleeding, infection, catheter occlusion. They understand that there is a possibility of infecting the pleural space which could be a potentially fatal complication in her case.  Plan: Right pleural catheter placement on Tuesday, 09/12/2014  Melrose Nakayama, MD [facility] [phone]

## 2014-09-12 NOTE — Transfer of Care (Signed)
Immediate Anesthesia Transfer of Care Note  Patient: [name]  Procedure(s) Performed: Procedure(s): INSERTION PLEURAL DRAINAGE CATHETER (Right)  Patient Location: PACU  Anesthesia Type:MAC  Level of Consciousness: awake, alert  and oriented  Airway & Oxygen Therapy: Patient Spontanous Breathing and Patient connected to nasal cannula oxygen  Post-op Assessment: Report given to RN and Post -op Vital signs reviewed and stable  Post vital signs: Reviewed and stable  Last Vitals:  Filed Vitals:   09/12/14 0845  BP: 127/69  Pulse: 53  Temp: 36.6 C  Resp: 14    Complications: No apparent anesthesia complications

## 2014-09-12 NOTE — Anesthesia Preprocedure Evaluation (Signed)
Anesthesia Evaluation  Patient identified by MRN, date of birth, ID band Patient awake    Reviewed: Allergy & Precautions, H&P , NPO status , Patient's Chart, lab work & pertinent test results, reviewed documented beta blocker date and time   Airway Mallampati: II  TM Distance: >3 FB Neck ROM: Full    Dental no notable dental hx. (+) Edentulous Upper, Dental Advisory Given   Pulmonary shortness of breath,  breath sounds clear to auscultation  Pulmonary exam normal       Cardiovascular hypertension, Pt. on medications and Pt. on home beta blockers + Peripheral Vascular Disease and +CHF + Valvular Problems/Murmurs AS Rhythm:Regular Rate:Normal + Systolic murmurs    Neuro/Psych  Headaches, Anxiety Depression    GI/Hepatic negative GI ROS, Neg liver ROS,   Endo/Other  diabetes, Type 2, Oral Hypoglycemic Agents  Renal/GU Renal InsufficiencyRenal disease  negative genitourinary   Musculoskeletal  (+) Arthritis -, Osteoarthritis,    Abdominal   Peds  Hematology negative hematology ROS (+) anemia ,   Anesthesia Other Findings   Reproductive/Obstetrics negative OB ROS                             Anesthesia Physical Anesthesia Plan  ASA: IV  Anesthesia Plan: MAC   Post-op Pain Management:    Induction: Intravenous  Airway Management Planned: Simple Face Mask  Additional Equipment:   Intra-op Plan:   Post-operative Plan:   Informed Consent: I have reviewed the patients History and Physical, chart, labs and discussed the procedure including the risks, benefits and alternatives for the proposed anesthesia with the patient or authorized representative who has indicated his/her understanding and acceptance.   Dental advisory given  Plan Discussed with: CRNA  Anesthesia Plan Comments:         Anesthesia Quick Evaluation

## 2014-09-13 ENCOUNTER — Encounter (HOSPITAL_COMMUNITY): Payer: Self-pay | Admitting: Thoracic Surgery (Cardiothoracic Vascular Surgery)

## 2014-09-13 NOTE — Op Note (Signed)
[name], [name]                  ACCOUNT NO.:  [id number]  MEDICAL RECORD NO.:  [id number]  LOCATION:  [facility]                         FACILITY:  [facility]  PHYSICIAN:  Revonda Standard. Roxan Hockey, M.D.DATE OF BIRTH:  01/31/1926  DATE OF PROCEDURE:  09/12/2014 DATE OF DISCHARGE:  09/12/2014                              OPERATIVE REPORT   PREOPERATIVE DIAGNOSIS:  Recurrent right pleural effusion.  POSTOPERATIVE DIAGNOSIS:  Recurrent right pleural effusion.  PROCEDURE:  Insertion of right pleural drainage catheter.  SURGEON:  Revonda Standard. Roxan Hockey, M.D.  ASSISTANT:  None.  ANESTHESIA:  Local with intravenous sedation.  FINDINGS:  750 mL of serous fluid evacuated. Catheter position in pleural space confirmed with fluoroscopy.  CLINICAL NOTE:  [name] is an 81 year old woman with multiple medical problems.  She has had recurrent right pleural effusions, requiring multiple thoracenteses.  She becomes symptomatic quickly after each thoracentesis.  A CT of the chest showed a space with incomplete re- expansion of the lung following her last thoracentesis.  She was referred for a pleural drainage catheter.  The indications, risks, benefits, and alternatives were discussed in detail with the patient and her daughter.  They do understand that because the lung does not completely expand, there was no good endpoint for pleural symphysis for catheter removal, but wished to proceed despite that limitation.  OPERATIVE NOTE:  [name] was brought to the operating room on September 12, 2014.  She was given intravenous sedation and monitored by Anesthesia. The right chest was prepped and draped in usual sterile fashion.  1% lidocaine was used for local anesthetic.  The entry site, exit site, and tract between the two was anesthetized, 10 mL of lidocaine was used. The pleural effusion was accessed with a finder needle.  An incision was made at the entry site.  The right pleural effusion was accessed  with the needle.  A wire was advanced into the chest.  Fluoroscopy confirmed intrapleural position.  The catheter then was tunneled from the exit site to the entry site with the cuff of the catheter just below the skin at the exit site.  The tract over the wire was dilated and the peel- away sheath introducer was advanced.  The catheter then was advanced through and the sheath was removed.  The catheter was hooked to the suction bottle and 750 mL of clear serous fluid was evacuated.  The entry site incision was closed with a 3-0 Vicryl subcuticular suture.  A 3-0 nylon was used to secure the catheter at the exit site.  Dermabond was applied to the entry site incision and the catheter was capped and dressed.  The patient tolerated the procedure well and was taken to the postanesthetic care unit in good condition.     Revonda Standard Roxan Hockey, M.D.     SCH/MEDQ  D:  09/12/2014  T:  09/12/2014  Job:  [id number]

## 2014-09-15 ENCOUNTER — Ambulatory Visit (HOSPITAL_COMMUNITY)
Admission: RE | Admit: 2014-09-15 | Discharge: 2014-09-15 | Disposition: A | Discharge status: Home / Self Care | Payer: Medicare Other | Source: Ambulatory Visit | Attending: Nephrology | Admitting: Nephrology

## 2014-09-15 ENCOUNTER — Other Ambulatory Visit: Payer: Medicare Other

## 2014-09-15 DIAGNOSIS — D631 Anemia in chronic kidney disease: Secondary | ICD-10-CM | POA: Insufficient documentation

## 2014-09-15 DIAGNOSIS — Z5181 Encounter for therapeutic drug level monitoring: Secondary | ICD-10-CM | POA: Diagnosis not present

## 2014-09-15 DIAGNOSIS — Z79899 Other long term (current) drug therapy: Secondary | ICD-10-CM | POA: Diagnosis not present

## 2014-09-15 DIAGNOSIS — N184 Chronic kidney disease, stage 4 (severe): Secondary | ICD-10-CM | POA: Insufficient documentation

## 2014-09-15 LAB — RENAL FUNCTION PANEL
ANION GAP: 12 (ref 5–15)
Albumin: 2.7 g/dL — ABNORMAL LOW (ref 3.5–5.0)
BUN: 41 mg/dL — ABNORMAL HIGH (ref 6–20)
CALCIUM: 8.9 mg/dL (ref 8.9–10.3)
CHLORIDE: 104 mmol/L (ref 101–111)
CO2: 25 mmol/L (ref 22–32)
Creatinine, Ser: 2.44 mg/dL — ABNORMAL HIGH (ref 0.44–1.00)
GFR calc non Af Amer: 17 mL/min — ABNORMAL LOW (ref >60)
GFR, EST AFRICAN AMERICAN: 20 mL/min — AB (ref >60)
Glucose, Bld: 159 mg/dL — ABNORMAL HIGH (ref 65–99)
PHOSPHORUS: 4.5 mg/dL (ref 2.5–4.6)
POTASSIUM: 3.8 mmol/L (ref 3.5–5.1)
Sodium: 141 mmol/L (ref 135–145)

## 2014-09-15 LAB — IRON AND TIBC
IRON: 30 ug/dL (ref 28–170)
Saturation Ratios: 14 % (ref 10.4–31.8)
TIBC: 214 ug/dL — ABNORMAL LOW (ref 250–450)
UIBC: 184 ug/dL

## 2014-09-15 LAB — FERRITIN: Ferritin: 364 ng/mL — ABNORMAL HIGH (ref 11–307)

## 2014-09-15 LAB — POCT HEMOGLOBIN-HEMACUE: HEMOGLOBIN: 10.6 g/dL — AB (ref 12.0–15.0)

## 2014-09-15 MED ORDER — EPOETIN ALFA 20000 UNIT/ML IJ SOLN
20000.0000 [IU] | INTRAMUSCULAR | Status: DC
  Administered 2014-09-15: 20000 [IU] via SUBCUTANEOUS

## 2014-09-15 MED ORDER — EPOETIN ALFA 20000 UNIT/ML IJ SOLN
INTRAMUSCULAR | Status: AC
  Filled 2014-09-15: qty 1

## 2014-09-16 LAB — PTH, INTACT AND CALCIUM
CALCIUM TOTAL (PTH): 8.8 mg/dL (ref 8.7–10.3)
PTH: 82 pg/mL — ABNORMAL HIGH (ref 15–65)

## 2014-09-18 ENCOUNTER — Other Ambulatory Visit: Payer: Self-pay

## 2014-09-19 NOTE — Progress Notes (Signed)
Cardiology Office Note Date:  09/20/2014   ID:  [name], DOB 07-11-1925, MRN [id number]  PCP:  Abigail Miyamoto, MD  Cardiologist:  [name], MD    Chief Complaint  Patient presents with  . Shortness of Breath   History of Present Illness: [name] is a 81 y.o. female who presents for evaluation of severe aortic stenosis. The patient has multiple severe comorbid medical conditions including advanced kidney disease with placement of an AV fistula in 2014, insulin-requiring diabetes, and chronic diastolic heart failure with a recently placed Pleurx catheter to drain a recurrent symptomatic pleural effusion. She had remote colon cancer and has had a colostomy for over 40 years now. She was hospitalized in April with acalculous cholecystitis and was treated with a percutaneous drain. Right pleural effusion was initially discovered at that time.   She lives in [place], but has been living with her daughter in [place] since undergoing placement of a pleural drainage catheter.  She was quite active in her 15's and she cared for her husband who was ill for many years. However, over the past 5 years she has become increasingly sedentary. She had 7 children, 6 still living.  She has ambulated with a walker for the past 2 years. She is most limited by back problems. She describes shortness of breath with activity, but this is better since undergoing pleural drainage. She has occasional chest pains that are sharp and fleeting, states 'I don't pay much attention to it.' Has occasional dizziness when she first stands up but no presyncope or frank syncope.  Past Medical History  Diagnosis Date  . CKD (chronic kidney disease), stage IV     a. Baseline Cr reported 2.5-2.6.  Marland Kitchen Anxiety   . Diabetes mellitus     diabetic retinopathy  . Anemia   . Urinary tract infection   . Fall at home   . Colostomy care     colon ca - h/o bloody output from bag 12/2012 (ED visit)  . Colon cancer 30  year ago  . Chronic atrial fibrillation     a. not on anticoagulation due to increased risk of falls, advanced age.  . Chronic diastolic CHF (congestive heart failure)     a. Echo 10/2013: mild LVH, EF 60-65%, mod AS, mildly dilated LA, mod dilated RA, PASP 33.  Marland Kitchen Hypertension   . Carotid disease, bilateral     a. Carotid duplex 04/9935: 16-96% RICA/LICA.  Marland Kitchen H/O cardiovascular stress test     a. 2002: nuc negative for ischemia, EF 65%.  . Shortness of breath dyspnea   . Severe aortic stenosis 08/23/2014  . Heart murmur   . Depression     situational  . Headache   . Arthritis   . Recurrent pleural effusion on right     Past Surgical History  Procedure Laterality Date  . Abdominal surgery    . Abdominal hysterectomy    . Colon surgery    . Appendectomy    . Colostomy    . Cardioversion  09/30/2011    Procedure: CARDIOVERSION;  Surgeon: [name], MD;  Location: [facility];  Service: Cardiovascular;  Laterality: N/A;  . I&d extremity  11/20/2011    Procedure: IRRIGATION AND DEBRIDEMENT EXTREMITY;  Surgeon: Alta Corning, MD;  Location: [facility];  Service: Orthopedics;  Laterality: Left;  . Av fistula placement Left 06/02/2012    Procedure: ARTERIOVENOUS (AV) FISTULA CREATION;  Surgeon: Conrad Litchfield, MD;  Location:  [facility];  Service: Vascular;  Laterality: Left;  Bascilic Fistula  . Bascilic vein transposition Left 08/18/2012    Procedure: BASCILIC VEIN TRANSPOSITION;  Surgeon: Conrad , MD;  Location: [facility];  Service: Vascular;  Laterality: Left;  Left 2nd stage Basilic Vein Transposition   . Ercp N/A 07/11/2014    Procedure: ENDOSCOPIC RETROGRADE CHOLANGIOPANCREATOGRAPHY (ERCP);  Surgeon: Inda Castle, MD;  Location: [facility];  Service: Endoscopy;  Laterality: N/A;  . Biliary stent placement N/A 07/11/2014    Procedure: BILIARY STENT PLACEMENT;  Surgeon: Inda Castle, MD;  Location: [facility];  Service: Endoscopy;  Laterality: N/A;  . Ercp N/A 07/21/2014    Procedure: ENDOSCOPIC  RETROGRADE CHOLANGIOPANCREATOGRAPHY (ERCP);  Surgeon: Inda Castle, MD;  Location: [facility];  Service: Endoscopy;  Laterality: N/A;  . Fracture surgery      Right leg  . Chest tube insertion Right 09/12/2014    Procedure: INSERTION PLEURAL DRAINAGE CATHETER;  Surgeon: Melrose Nakayama, MD;  Location: [facility];  Service: Thoracic;  Laterality: Right;    Current Outpatient Prescriptions  Medication Sig Dispense Refill  . acetaminophen (TYLENOL) 500 MG tablet Take 500 mg by mouth every 6 (six) hours as needed for mild pain.     Marland Kitchen aspirin EC 81 MG tablet Take 81 mg by mouth daily.    . calcitRIOL (ROCALTROL) 0.25 MCG capsule Take 0.25 mcg by mouth daily.     . carvedilol (COREG) 3.125 MG tablet Take 1 tablet (3.125 mg total) by mouth 2 (two) times daily with a meal.    . clotrimazole-betamethasone (LOTRISONE) lotion Apply 1 application topically daily as needed (apply to rash on stomach as needed).   0  . docusate sodium (COLACE) 100 MG capsule Take 100 mg by mouth daily as needed for constipation.     Marland Kitchen epoetin alfa (EPOGEN,PROCRIT) 70488 UNIT/ML injection 20,000 Units. Every two weeks    . ferrous sulfate 325 (65 FE) MG tablet Take 1 tablet (325 mg total) by mouth 2 (two) times daily with a meal. 60 tablet 0  . furosemide (LASIX) 80 MG tablet Take 1 tablet (80 mg total) by mouth 2 (two) times daily. 60 tablet 3  . HYDROcodone-acetaminophen (NORCO/VICODIN) 5-325 MG per tablet Take 1 tablet by mouth every 4 (four) hours as needed for moderate pain. 20 tablet 0  . insulin glargine (LANTUS) 100 UNIT/ML injection Inject 8 Units into the skin at bedtime as needed (if cbg over 150).     Marland Kitchen linagliptin (TRADJENTA) 5 MG TABS tablet Take 5 mg by mouth daily.     Marland Kitchen LORazepam (ATIVAN) 0.5 MG tablet Take 0.5 mg by mouth 2 (two) times daily.    . meclizine (ANTIVERT) 12.5 MG tablet Take 12.5 mg by mouth as needed for dizziness.     . mirtazapine (REMERON) 7.5 MG tablet Take 7.5 mg by mouth at bedtime.      . promethazine (PHENERGAN) 12.5 MG tablet Take 1 tablet (12.5 mg total) by mouth every 6 (six) hours as needed for nausea or vomiting. 30 tablet 0  . simvastatin (ZOCOR) 20 MG tablet TAKE 1 TABLET (20 MG TOTAL) BY MOUTH AT BEDTIME. 30 tablet 5  . traMADol (ULTRAM) 50 MG tablet Take 1 tablet (50 mg total) by mouth every 6 (six) hours as needed for moderate pain. 30 tablet 0  . vitamin B-12 (CYANOCOBALAMIN) 1000 MCG tablet Take 1,000 mcg by mouth daily.     No current facility-administered medications for this visit.  Allergies:   Levaquin; Codeine; Keflet; Morphine and related; and Oxycodone   Social History:  The patient  reports that she has never smoked. Her smokeless tobacco use includes Chew. She reports that she does not drink alcohol or use illicit drugs.   Family History:  The patient's  family history includes Cancer in her son; Diabetes in her daughter, sister, and son; Heart attack in her father; Heart disease in her father and son; Hyperlipidemia in her daughter and son; Hypertension in her daughter and son; Other in her daughter; Stroke in her father.    ROS:  Please see the history of present illness.  Otherwise, review of systems is positive for chills, leg swelling, cough, DOE, abdominal pain, depression, back pain, muscle pain, dizziness, easy bruising, fatigue, leg pain, irregular heart beats, snoring, anxiety, nausea, headaches.  All other systems are reviewed and negative.    PHYSICAL EXAM: VS:  BP 120/40 mmHg  Pulse 68  Ht 5\' 3"  (1.6 m)  Wt 175 lb (79.379 kg)  BMI 31.01 kg/m2 , BMI Body mass index is 31.01 kg/(m^2). GEN: Well nourished, well developed, pleasant elderly woman in no acute distress HEENT: normal Neck: no JVD, no masses. bilateral carotid bruits Cardiac: RRR with grade 3/6 harsh systolic murmur at the LSB and RUSB, late-peaking                Respiratory: decreased BS on right, normal work of breathing GI: soft, nontender, nondistended, + BS MS: no  deformity or atrophy Ext: no pretibial edema Skin: warm and dry, no rash Neuro:  Strength and sensation are intact Psych: euthymic mood, full affect  EKG:  EKG is not ordered today.  Recent Labs: 05/14/2014: Magnesium 2.2 07/14/2014: B Natriuretic Peptide 526.1* 07/17/2014: TSH 0.935 09/08/2014: Pro B Natriuretic peptide (BNP) 312.0* 09/12/2014: ALT 7*; Platelets 157 09/15/2014: BUN 41*; Creatinine, Ser 2.44*; Hemoglobin 10.6*; Potassium 3.8; Sodium 141   Lipid Panel     Component Value Date/Time   CHOL 87 07/14/2014 1716   TRIG 136.0 06/15/2014 1013   HDL 36.30* 06/15/2014 1013   CHOLHDL 3 06/15/2014 1013   VLDL 27.2 06/15/2014 1013   LDLCALC 29 06/15/2014 1013      Wt Readings from Last 3 Encounters:  09/20/14 175 lb (79.379 kg)  09/15/14 172 lb (78.019 kg)  09/12/14 175 lb (79.379 kg)    Cardiac Studies Reviewed: 2D ECHO: Left ventricle: The cavity size was normal. There was moderate concentric hypertrophy. Systolic function was vigorous. The estimated ejection fraction was in the range of 65% to 70%. Wall motion was normal; there were no regional wall motion abnormalities. The study was not technically sufficient to allow evaluation of LV diastolic dysfunction due to atrial fibrillation.  ------------------------------------------------------------------- Aortic valve:  Trileaflet; moderately thickened, moderately calcified leaflets. Valve mobility was severely restricted. Doppler:  There was severe stenosis.  There was no regurgitation.  VTI ratio of LVOT to aortic valve: 0.24. Valve area (VTI): 0.88 cm^2. Indexed valve area (VTI): 0.46 cm^2/m^2. Peak velocity ratio of LVOT to aortic valve: 0.21. Valve area (Vmax): 0.77 cm^2. Indexed valve area (Vmax): 0.41 cm^2/m^2. Mean velocity ratio of LVOT to aortic valve: 0.2. Valve area (Vmean): 0.72 cm^2. Indexed valve area (Vmean): 0.38 cm^2/m^2.  Mean gradient (S): 34 mm Hg. Peak gradient (S): 61 mm  Hg.  ------------------------------------------------------------------- Aorta: The aorta was poorly visualized. Aortic root: The aortic root was normal in size. Ascending aorta: The ascending aorta was normal in size.  ------------------------------------------------------------------- Mitral valve:  Calcified  annulus. Leaflet separation was normal. Doppler: Transvalvular velocity was within the normal range. There was no evidence for stenosis. There was no regurgitation.  Peak gradient (D): 10 mm Hg.  ------------------------------------------------------------------- Left atrium: The atrium was mildly dilated.  ------------------------------------------------------------------- Right ventricle: The cavity size was normal. Wall thickness was normal. Systolic function was normal.  ------------------------------------------------------------------- Pulmonic valve:  Poorly visualized. The valve appears to be grossly normal.  Doppler: There was mild regurgitation.  ------------------------------------------------------------------- Tricuspid valve:  Structurally normal valve.  Leaflet separation was normal. Doppler: Transvalvular velocity was within the normal range. There was mild regurgitation.  ------------------------------------------------------------------- Pulmonary artery:  Systolic pressure was mildly increased.  ------------------------------------------------------------------- Right atrium: The atrium was normal in size.  ------------------------------------------------------------------- Pericardium: A trivial pericardial effusion was identified posterior to the heart.  ------------------------------------------------------------------- Systemic veins: Inferior vena cava: The vessel was normal in size. The respirophasic diameter changes were in the normal range (>= 50%), consistent with normal central venous  pressure.  ------------------------------------------------------------------- Post procedure conclusions Ascending Aorta:  - The aorta was poorly visualized.  ------------------------------------------------------------------- Measurements  Left ventricle              Value     Reference LV ID, ED, PLAX chordal      (L)   33.4 mm    43 - 52 LV ID, ES, PLAX chordal          24  mm    23 - 38 LV fx shortening, PLAX chordal  (L)   28  %    >=29 LV PW thickness, ED            13  mm    --------- IVS/LV PW ratio, ED            1.16      <=1.3 Stroke volume, 2D             50  ml    --------- Stroke volume/bsa, 2D           26  ml/m^2  ---------  Ventricular septum            Value     Reference IVS thickness, ED             15.1 mm    ---------  LVOT                   Value     Reference LVOT ID, S                21.6 mm    --------- LVOT area                 3.66 cm^2   --------- LVOT ID                  18  mm    --------- LVOT peak velocity, S           81.94 cm/s   --------- LVOT mean velocity, S           53.5 cm/s   --------- LVOT VTI, S                22.5 cm    --------- Stroke volume (SV), LVOT DP        82.5 ml    --------- Stroke index (SV/bsa), LVOT DP      43.5 ml/m^2  ---------  Aortic valve               Value  Reference Aortic valve peak velocity, S       390  cm/s   --------- Aortic valve mean velocity, S       271  cm/s   --------- Aortic valve VTI, S            94.5 cm    --------- Aortic mean gradient, S          34  mm Hg  --------- Aortic peak gradient, S           61  mm Hg  --------- VTI ratio, LVOT/AV            0.24      --------- Aortic valve area, VTI          0.88 cm^2   --------- Aortic valve area/bsa, VTI        0.46 cm^2/m^2 --------- Velocity ratio, peak, LVOT/AV       0.21      --------- Aortic valve area, peak velocity     0.77 cm^2   --------- Aortic valve area/bsa, peak        0.41 cm^2/m^2 --------- velocity Velocity ratio, mean, LVOT/AV       0.2      --------- Aortic valve area, mean velocity     0.72 cm^2   --------- Aortic valve area/bsa, mean        0.38 cm^2/m^2 --------- velocity  Aorta                   Value     Reference Aortic root ID, ED            33  mm    --------- Ascending aorta ID, A-P, S        39  mm    ---------  Left atrium                Value     Reference LA ID, A-P, ES              39  mm    --------- LA ID/bsa, A-P              2.06 cm/m^2  <=2.2 LA volume, ES, 1-p A4C          64  ml    --------- LA volume/bsa, ES, 1-p A4C        33.8 ml/m^2  ---------  Mitral valve               Value     Reference Mitral E-wave peak velocity        160  cm/s   --------- Mitral deceleration time         208  ms    150 - 230 Mitral peak gradient, D          10  mm Hg  ---------  Pulmonary arteries            Value     Reference PA pressure, S, DP        (H)   34  mm Hg  <=30  Tricuspid valve              Value     Reference Tricuspid regurg peak velocity      279  cm/s   --------- Tricuspid peak RV-RA gradient       31  mm Hg  ---------  Systemic veins  Value     Reference Estimated CVP                3   mm Hg  ---------  Right ventricle              Value     Reference RV pressure, S, DP        (H)   34  mm Hg  <=30  STS RISK CALCULATOR Procedure: AV Replacement Risk of Mortality: 13.604% Morbidity or Mortality: 45.078% Long Length of Stay: 28.49% Short Length of Stay: 5.978% Permanent Stroke: 4.388% Prolonged Ventilation: 35.807% DSW Infection: 0.461% Renal Failure: 28.592% Reoperation: 12.975%  ASSESSMENT AND PLAN: This is an 81 year-old woman with Stage D severe symptomatic aortic stenosis amongst multiple comorbid medical conditions which include:  Stage IV CKD with a nonfunctioning AV fistula  Chronic right pleural effusion with an indwelling drainage catheter  Debilitation/weakness  Insulin-dependent Type 2 DM  Chronic atrial fibrillation not on anticoagulation because of fall risk  I have reviewed the natural history of aortic stenosis with the patient and her oldest daughter who is present today. We have discussed the limitations of medical therapy and the poor prognosis associated with symptomatic aortic stenosis. We have also reviewed potential treatment options, including palliative medical therapy, conventional surgical aortic valve replacement, and transcatheter aortic valve replacement. We discussed treatment options in the context of this patient's specific comorbid medical conditions. She is clearly not a candidate for conventional cardiac surgery under any circumstances. I am concerned that even with TAVR, she may not return to her current level of independence. With advanced kidney disease, there is probably a 50% chance or greater that she would go on to develop ESRD with the CTA studies, cardiac cath, and TAVR surgery all of which require iodinated contrast administration. I think she clearly would be worse off on dialysis, even with a functioning aortic valve replacement.   All of this was reviewed at  length with the patient today. She still would like to consider pursuing TAVR as she feels if nothing is done her clinical course will continue to decline. I advised that I would consult with Dr Radford Pax, Dr Moshe Cipro, and Dr Roxan Hockey to further discuss her case. I also will refer her for a second cardiac surgical opinion before undertaking any cath or imaging studies. The patient and her daughter are agreeable with this plan and we will follow-up with them after the above consultations are completed.  Current medicines are reviewed with the patient today.  The patient does not have concerns regarding medicines.  Labs/ tests ordered today include:  No orders of the defined types were placed in this encounter.   Deatra James, MD  09/20/2014 11:23 AM    [address], [place], [place]  [address] Phone: [phone]; Fax: [phone]

## 2014-09-20 ENCOUNTER — Encounter: Payer: Self-pay | Admitting: Cardiovascular Disease

## 2014-09-20 ENCOUNTER — Ambulatory Visit (INDEPENDENT_AMBULATORY_CARE_PROVIDER_SITE_OTHER): Payer: Medicare Other | Admitting: Cardiovascular Disease

## 2014-09-20 VITALS — BP 120/40 | HR 68 | Ht 63.0 in | Wt 175.0 lb

## 2014-09-20 DIAGNOSIS — I6523 Occlusion and stenosis of bilateral carotid arteries: Secondary | ICD-10-CM

## 2014-09-20 DIAGNOSIS — I350 Nonrheumatic aortic (valve) stenosis: Secondary | ICD-10-CM | POA: Diagnosis not present

## 2014-09-20 NOTE — Patient Instructions (Signed)
Medication Instructions:  Your physician recommends that you continue on your current medications as directed. Please refer to the Current Medication list given to you today.  Labwork: No new orders.   Testing/Procedures: No new orders.   Follow-Up: You have been referred to Dr Owen/Dr Cyndia Bent with [facility] for further TAVR evaluation.    Any Other Special Instructions Will Be Listed Below (If Applicable).

## 2014-09-22 ENCOUNTER — Emergency Department (HOSPITAL_COMMUNITY): Payer: Medicare Other

## 2014-09-22 ENCOUNTER — Inpatient Hospital Stay (HOSPITAL_COMMUNITY)
Admission: EM | Admit: 2014-09-22 | Discharge: 2014-09-29 | DRG: 371 | Disposition: A | Discharge status: Home Health | Payer: Medicare Other | Attending: Internal Medicine | Admitting: Internal Medicine

## 2014-09-22 ENCOUNTER — Encounter (HOSPITAL_COMMUNITY): Payer: Self-pay

## 2014-09-22 DIAGNOSIS — Z79899 Other long term (current) drug therapy: Secondary | ICD-10-CM | POA: Diagnosis not present

## 2014-09-22 DIAGNOSIS — E860 Dehydration: Secondary | ICD-10-CM | POA: Diagnosis present

## 2014-09-22 DIAGNOSIS — R197 Diarrhea, unspecified: Secondary | ICD-10-CM

## 2014-09-22 DIAGNOSIS — Z85038 Personal history of other malignant neoplasm of large intestine: Secondary | ICD-10-CM | POA: Diagnosis not present

## 2014-09-22 DIAGNOSIS — N170 Acute kidney failure with tubular necrosis: Secondary | ICD-10-CM | POA: Diagnosis present

## 2014-09-22 DIAGNOSIS — R0902 Hypoxemia: Secondary | ICD-10-CM

## 2014-09-22 DIAGNOSIS — F17220 Nicotine dependence, chewing tobacco, uncomplicated: Secondary | ICD-10-CM | POA: Diagnosis present

## 2014-09-22 DIAGNOSIS — I482 Chronic atrial fibrillation, unspecified: Secondary | ICD-10-CM

## 2014-09-22 DIAGNOSIS — E11319 Type 2 diabetes mellitus with unspecified diabetic retinopathy without macular edema: Secondary | ICD-10-CM | POA: Diagnosis present

## 2014-09-22 DIAGNOSIS — Z933 Colostomy status: Secondary | ICD-10-CM

## 2014-09-22 DIAGNOSIS — R112 Nausea with vomiting, unspecified: Secondary | ICD-10-CM | POA: Diagnosis present

## 2014-09-22 DIAGNOSIS — E46 Unspecified protein-calorie malnutrition: Secondary | ICD-10-CM | POA: Diagnosis present

## 2014-09-22 DIAGNOSIS — N184 Chronic kidney disease, stage 4 (severe): Secondary | ICD-10-CM | POA: Diagnosis present

## 2014-09-22 DIAGNOSIS — M898X9 Other specified disorders of bone, unspecified site: Secondary | ICD-10-CM | POA: Diagnosis present

## 2014-09-22 DIAGNOSIS — R1110 Vomiting, unspecified: Secondary | ICD-10-CM

## 2014-09-22 DIAGNOSIS — Z794 Long term (current) use of insulin: Secondary | ICD-10-CM | POA: Diagnosis not present

## 2014-09-22 DIAGNOSIS — A047 Enterocolitis due to Clostridium difficile: Principal | ICD-10-CM | POA: Diagnosis present

## 2014-09-22 DIAGNOSIS — D6959 Other secondary thrombocytopenia: Secondary | ICD-10-CM | POA: Diagnosis present

## 2014-09-22 DIAGNOSIS — Z66 Do not resuscitate: Secondary | ICD-10-CM | POA: Diagnosis present

## 2014-09-22 DIAGNOSIS — N179 Acute kidney failure, unspecified: Secondary | ICD-10-CM | POA: Diagnosis not present

## 2014-09-22 DIAGNOSIS — Z7982 Long term (current) use of aspirin: Secondary | ICD-10-CM

## 2014-09-22 DIAGNOSIS — R109 Unspecified abdominal pain: Secondary | ICD-10-CM

## 2014-09-22 DIAGNOSIS — I350 Nonrheumatic aortic (valve) stenosis: Secondary | ICD-10-CM | POA: Diagnosis present

## 2014-09-22 DIAGNOSIS — D649 Anemia, unspecified: Secondary | ICD-10-CM | POA: Diagnosis present

## 2014-09-22 DIAGNOSIS — D72829 Elevated white blood cell count, unspecified: Secondary | ICD-10-CM | POA: Diagnosis present

## 2014-09-22 DIAGNOSIS — J90 Pleural effusion, not elsewhere classified: Secondary | ICD-10-CM | POA: Diagnosis present

## 2014-09-22 DIAGNOSIS — R627 Adult failure to thrive: Secondary | ICD-10-CM | POA: Diagnosis present

## 2014-09-22 DIAGNOSIS — E1122 Type 2 diabetes mellitus with diabetic chronic kidney disease: Secondary | ICD-10-CM | POA: Diagnosis present

## 2014-09-22 DIAGNOSIS — K8020 Calculus of gallbladder without cholecystitis without obstruction: Secondary | ICD-10-CM | POA: Diagnosis present

## 2014-09-22 DIAGNOSIS — D631 Anemia in chronic kidney disease: Secondary | ICD-10-CM | POA: Diagnosis present

## 2014-09-22 DIAGNOSIS — Z515 Encounter for palliative care: Secondary | ICD-10-CM | POA: Diagnosis not present

## 2014-09-22 DIAGNOSIS — E118 Type 2 diabetes mellitus with unspecified complications: Secondary | ICD-10-CM

## 2014-09-22 DIAGNOSIS — R5381 Other malaise: Secondary | ICD-10-CM | POA: Diagnosis present

## 2014-09-22 DIAGNOSIS — I959 Hypotension, unspecified: Secondary | ICD-10-CM | POA: Diagnosis not present

## 2014-09-22 DIAGNOSIS — I10 Essential (primary) hypertension: Secondary | ICD-10-CM | POA: Diagnosis present

## 2014-09-22 DIAGNOSIS — I129 Hypertensive chronic kidney disease with stage 1 through stage 4 chronic kidney disease, or unspecified chronic kidney disease: Secondary | ICD-10-CM | POA: Diagnosis present

## 2014-09-22 DIAGNOSIS — R0600 Dyspnea, unspecified: Secondary | ICD-10-CM | POA: Diagnosis not present

## 2014-09-22 DIAGNOSIS — A0472 Enterocolitis due to Clostridium difficile, not specified as recurrent: Secondary | ICD-10-CM | POA: Insufficient documentation

## 2014-09-22 DIAGNOSIS — I5032 Chronic diastolic (congestive) heart failure: Secondary | ICD-10-CM

## 2014-09-22 DIAGNOSIS — R10A1 Flank pain, right side: Secondary | ICD-10-CM

## 2014-09-22 DIAGNOSIS — R0602 Shortness of breath: Secondary | ICD-10-CM

## 2014-09-22 DIAGNOSIS — J948 Other specified pleural conditions: Secondary | ICD-10-CM | POA: Diagnosis not present

## 2014-09-22 HISTORY — DX: Enterocolitis due to Clostridium difficile, not specified as recurrent: A04.72

## 2014-09-22 LAB — CBC WITH DIFFERENTIAL/PLATELET
BASOS ABS: 0 10*3/uL (ref 0.0–0.1)
BASOS PCT: 0 % (ref 0–1)
EOS PCT: 0 % (ref 0–5)
Eosinophils Absolute: 0 10*3/uL (ref 0.0–0.7)
HEMATOCRIT: 34.2 % — AB (ref 36.0–46.0)
HEMOGLOBIN: 10.4 g/dL — AB (ref 12.0–15.0)
Lymphocytes Relative: 9 % — ABNORMAL LOW (ref 12–46)
Lymphs Abs: 1 10*3/uL (ref 0.7–4.0)
MCH: 28.4 pg (ref 26.0–34.0)
MCHC: 30.4 g/dL (ref 30.0–36.0)
MCV: 93.4 fL (ref 78.0–100.0)
MONO ABS: 0.4 10*3/uL (ref 0.1–1.0)
Monocytes Relative: 4 % (ref 3–12)
NEUTROS PCT: 87 % — AB (ref 43–77)
Neutro Abs: 9.4 10*3/uL — ABNORMAL HIGH (ref 1.7–7.7)
PLATELETS: 148 10*3/uL — AB (ref 150–400)
RBC: 3.66 MIL/uL — ABNORMAL LOW (ref 3.87–5.11)
RDW: 16.1 % — ABNORMAL HIGH (ref 11.5–15.5)
WBC: 10.8 10*3/uL — ABNORMAL HIGH (ref 4.0–10.5)

## 2014-09-22 LAB — COMPREHENSIVE METABOLIC PANEL
ALBUMIN: 2.6 g/dL — AB (ref 3.5–5.0)
ALK PHOS: 106 U/L (ref 38–126)
ALT: 6 U/L — ABNORMAL LOW (ref 14–54)
AST: 14 U/L — ABNORMAL LOW (ref 15–41)
Anion gap: 10 (ref 5–15)
BUN: 44 mg/dL — AB (ref 6–20)
CALCIUM: 8.6 mg/dL — AB (ref 8.9–10.3)
CO2: 26 mmol/L (ref 22–32)
Chloride: 104 mmol/L (ref 101–111)
Creatinine, Ser: 2.7 mg/dL — ABNORMAL HIGH (ref 0.44–1.00)
GFR calc non Af Amer: 15 mL/min — ABNORMAL LOW (ref >60)
GFR, EST AFRICAN AMERICAN: 17 mL/min — AB (ref >60)
Glucose, Bld: 182 mg/dL — ABNORMAL HIGH (ref 65–99)
Potassium: 3.8 mmol/L (ref 3.5–5.1)
Sodium: 140 mmol/L (ref 135–145)
Total Bilirubin: 0.5 mg/dL (ref 0.3–1.2)
Total Protein: 5.6 g/dL — ABNORMAL LOW (ref 6.5–8.1)

## 2014-09-22 LAB — URINALYSIS, ROUTINE W REFLEX MICROSCOPIC
BILIRUBIN URINE: NEGATIVE
Glucose, UA: NEGATIVE mg/dL
HGB URINE DIPSTICK: NEGATIVE
Ketones, ur: NEGATIVE mg/dL
Leukocytes, UA: NEGATIVE
NITRITE: NEGATIVE
PH: 5 (ref 5.0–8.0)
Protein, ur: NEGATIVE mg/dL
Specific Gravity, Urine: 1.008 (ref 1.005–1.030)
UROBILINOGEN UA: 0.2 mg/dL (ref 0.0–1.0)

## 2014-09-22 LAB — GLUCOSE, CAPILLARY: Glucose-Capillary: 134 mg/dL — ABNORMAL HIGH (ref 65–99)

## 2014-09-22 LAB — MAGNESIUM: Magnesium: 1.9 mg/dL (ref 1.7–2.4)

## 2014-09-22 LAB — LIPASE, BLOOD: Lipase: 28 U/L (ref 22–51)

## 2014-09-22 MED ORDER — ASPIRIN EC 81 MG PO TBEC
81.0000 mg | DELAYED_RELEASE_TABLET | Freq: Every day | ORAL | Status: DC
  Administered 2014-09-22 – 2014-09-28 (×7): 81 mg via ORAL
  Filled 2014-09-22 (×7): qty 1

## 2014-09-22 MED ORDER — SIMVASTATIN 20 MG PO TABS
20.0000 mg | ORAL_TABLET | Freq: Every day | ORAL | Status: DC
  Administered 2014-09-23 – 2014-09-28 (×6): 20 mg via ORAL
  Filled 2014-09-22 (×6): qty 1

## 2014-09-22 MED ORDER — SODIUM CHLORIDE 0.9 % IV SOLN
INTRAVENOUS | Status: DC
Start: 2014-09-22 — End: 2014-09-23
  Administered 2014-09-22: 17:00:00 via INTRAVENOUS

## 2014-09-22 MED ORDER — SODIUM CHLORIDE 0.9 % IV BOLUS (SEPSIS)
1000.0000 mL | Freq: Once | INTRAVENOUS | Status: AC
  Administered 2014-09-22: 1000 mL via INTRAVENOUS

## 2014-09-22 MED ORDER — PROCHLORPERAZINE EDISYLATE 5 MG/ML IJ SOLN
10.0000 mg | Freq: Four times a day (QID) | INTRAMUSCULAR | Status: DC | PRN
  Administered 2014-09-27: 10 mg via INTRAVENOUS
  Filled 2014-09-22 (×4): qty 2

## 2014-09-22 MED ORDER — ACETAMINOPHEN 650 MG RE SUPP: 650.0000 mg | Freq: Four times a day (QID) | RECTAL | Status: DC | PRN

## 2014-09-22 MED ORDER — ONDANSETRON HCL 4 MG/2ML IJ SOLN
4.0000 mg | Freq: Once | INTRAMUSCULAR | Status: AC
  Administered 2014-09-22: 4 mg via INTRAVENOUS
  Filled 2014-09-22: qty 2

## 2014-09-22 MED ORDER — IOHEXOL 300 MG/ML  SOLN
25.0000 mL | INTRAMUSCULAR | Status: AC
  Administered 2014-09-22: 25 mL via ORAL

## 2014-09-22 MED ORDER — VITAMIN B-12 1000 MCG PO TABS
1000.0000 ug | ORAL_TABLET | Freq: Every day | ORAL | Status: DC
  Administered 2014-09-23 – 2014-09-29 (×7): 1000 ug via ORAL
  Filled 2014-09-22 (×7): qty 1

## 2014-09-22 MED ORDER — INSULIN ASPART 100 UNIT/ML ~~LOC~~ SOLN
0.0000 [IU] | Freq: Three times a day (TID) | SUBCUTANEOUS | Status: DC
  Administered 2014-09-23: 1 [IU] via SUBCUTANEOUS
  Administered 2014-09-23: 2 [IU] via SUBCUTANEOUS
  Administered 2014-09-23: 1 [IU] via SUBCUTANEOUS
  Administered 2014-09-24: 2 [IU] via SUBCUTANEOUS
  Administered 2014-09-25 – 2014-09-26 (×4): 1 [IU] via SUBCUTANEOUS
  Administered 2014-09-27: 2 [IU] via SUBCUTANEOUS
  Administered 2014-09-27: 1 [IU] via SUBCUTANEOUS
  Administered 2014-09-28: 2 [IU] via SUBCUTANEOUS
  Administered 2014-09-28: 1 [IU] via SUBCUTANEOUS

## 2014-09-22 MED ORDER — LORAZEPAM 0.5 MG PO TABS
0.5000 mg | ORAL_TABLET | Freq: Two times a day (BID) | ORAL | Status: DC
  Administered 2014-09-22 – 2014-09-29 (×14): 0.5 mg via ORAL
  Filled 2014-09-22 (×14): qty 1

## 2014-09-22 MED ORDER — TRAMADOL HCL 50 MG PO TABS: 50.0000 mg | ORAL_TABLET | Freq: Four times a day (QID) | ORAL | Status: DC | PRN

## 2014-09-22 MED ORDER — CALCITRIOL 0.25 MCG PO CAPS
0.2500 ug | ORAL_CAPSULE | Freq: Every day | ORAL | Status: DC
  Administered 2014-09-23 – 2014-09-29 (×7): 0.25 ug via ORAL
  Filled 2014-09-22 (×8): qty 1

## 2014-09-22 MED ORDER — ONDANSETRON HCL 4 MG/2ML IJ SOLN
4.0000 mg | Freq: Three times a day (TID) | INTRAMUSCULAR | Status: DC
  Administered 2014-09-22 – 2014-09-28 (×17): 4 mg via INTRAVENOUS
  Filled 2014-09-22 (×19): qty 2

## 2014-09-22 MED ORDER — MECLIZINE HCL 12.5 MG PO TABS
12.5000 mg | ORAL_TABLET | ORAL | Status: DC | PRN
  Filled 2014-09-22: qty 1

## 2014-09-22 MED ORDER — CARVEDILOL 25 MG PO TABS
25.0000 mg | ORAL_TABLET | Freq: Two times a day (BID) | ORAL | Status: DC
  Administered 2014-09-23 – 2014-09-29 (×12): 25 mg via ORAL
  Filled 2014-09-22 (×13): qty 1

## 2014-09-22 MED ORDER — HYDROCODONE-ACETAMINOPHEN 5-325 MG PO TABS: 1.0000 | ORAL_TABLET | ORAL | Status: DC | PRN

## 2014-09-22 MED ORDER — HYDROMORPHONE HCL 1 MG/ML IJ SOLN
1.0000 mg | Freq: Once | INTRAMUSCULAR | Status: AC
  Administered 2014-09-22: 1 mg via INTRAVENOUS
  Filled 2014-09-22: qty 1

## 2014-09-22 MED ORDER — ACETAMINOPHEN 325 MG PO TABS: 650.0000 mg | ORAL_TABLET | Freq: Four times a day (QID) | ORAL | Status: DC | PRN

## 2014-09-22 MED ORDER — HEPARIN SODIUM (PORCINE) 5000 UNIT/ML IJ SOLN
5000.0000 [IU] | Freq: Three times a day (TID) | INTRAMUSCULAR | Status: DC
  Administered 2014-09-22 – 2014-09-29 (×21): 5000 [IU] via SUBCUTANEOUS
  Filled 2014-09-22 (×18): qty 1

## 2014-09-22 MED ORDER — FERROUS SULFATE 325 (65 FE) MG PO TABS
325.0000 mg | ORAL_TABLET | Freq: Two times a day (BID) | ORAL | Status: DC
  Administered 2014-09-23 – 2014-09-29 (×13): 325 mg via ORAL
  Filled 2014-09-22 (×13): qty 1

## 2014-09-22 MED ORDER — MIRTAZAPINE 7.5 MG PO TABS
7.5000 mg | ORAL_TABLET | Freq: Every day | ORAL | Status: DC
  Administered 2014-09-23 – 2014-09-28 (×6): 7.5 mg via ORAL
  Filled 2014-09-22 (×8): qty 1

## 2014-09-22 NOTE — ED Notes (Signed)
[facility]- pt coming from home with chief complaint of flank pain. Pt has other multiple complaints, drain for pleural effusion, gall bladder drain removed on May 2. Pt reports nausea and vomiting since around 0300. Pt a&o x4. Extensive hx.

## 2014-09-22 NOTE — ED Provider Notes (Signed)
CSN: [id number]     Arrival date & time 09/22/14  1029 History   First MD Initiated Contact with Patient 09/22/14 1031     Chief Complaint  Patient presents with  . Flank Pain  flank pain   (Consider location/radiation/quality/duration/timing/severity/associated sxs/prior Treatment) The history is provided by the patient. No language interpreter was used.   [name] is an 82 year old female with a history of CKD, diabetes, anemia, colon cancer, atrial fibrillation, CHF, hypertension who presents with right flank pain, nausea, vomiting times several episodes since 3am this morning. Per family she is not on dialysis. She had a gallbladder drain removed on 07/24/2014. She also has a drain in place for a pleural effusion. Per patient she had several bowel movements last night and was changing her colostomy bag often. She denies any fever, chills, chest pain, shortness of breath, cough, constipation, leg swelling. Past Medical History  Diagnosis Date  . CKD (chronic kidney disease), stage IV     a. Baseline Cr reported 2.5-2.6.  Marland Kitchen Anxiety   . Diabetes mellitus     diabetic retinopathy  . Anemia   . Urinary tract infection   . Fall at home   . Colostomy care     colon ca - h/o bloody output from bag 12/2012 (ED visit)  . Colon cancer 30 year ago  . Chronic atrial fibrillation     a. not on anticoagulation due to increased risk of falls, advanced age.  . Chronic diastolic CHF (congestive heart failure)     a. Echo 10/2013: mild LVH, EF 60-65%, mod AS, mildly dilated LA, mod dilated RA, PASP 33.  Marland Kitchen Hypertension   . Carotid disease, bilateral     a. Carotid duplex 07/6385: 56-43% RICA/LICA.  Marland Kitchen H/O cardiovascular stress test     a. 2002: nuc negative for ischemia, EF 65%.  . Shortness of breath dyspnea   . Severe aortic stenosis 08/23/2014  . Heart murmur   . Depression     situational  . Headache   . Arthritis   . Recurrent pleural effusion on right    Past Surgical History    Procedure Laterality Date  . Abdominal surgery    . Abdominal hysterectomy    . Colon surgery    . Appendectomy    . Colostomy    . Cardioversion  09/30/2011    Procedure: CARDIOVERSION;  Surgeon: [name], MD;  Location: [place];  Service: Cardiovascular;  Laterality: N/A;  . I&d extremity  11/20/2011    Procedure: IRRIGATION AND DEBRIDEMENT EXTREMITY;  Surgeon: Alta Corning, MD;  Location: [place];  Service: Orthopedics;  Laterality: Left;  . Av fistula placement Left 06/02/2012    Procedure: ARTERIOVENOUS (AV) FISTULA CREATION;  Surgeon: Conrad Kensett, MD;  Location: [facility] [facility];  Service: Vascular;  Laterality: Left;  Bascilic Fistula  . Bascilic vein transposition Left 08/18/2012    Procedure: BASCILIC VEIN TRANSPOSITION;  Surgeon: Conrad Woodland, MD;  Location: [place];  Service: Vascular;  Laterality: Left;  Left 2nd stage Basilic Vein Transposition   . Ercp N/A 07/11/2014    Procedure: ENDOSCOPIC RETROGRADE CHOLANGIOPANCREATOGRAPHY (ERCP);  Surgeon: Inda Castle, MD;  Location: [facility];  Service: Endoscopy;  Laterality: N/A;  . Biliary stent placement N/A 07/11/2014    Procedure: BILIARY STENT PLACEMENT;  Surgeon: Inda Castle, MD;  Location: [facility];  Service: Endoscopy;  Laterality: N/A;  . Ercp N/A 07/21/2014    Procedure: ENDOSCOPIC RETROGRADE CHOLANGIOPANCREATOGRAPHY (ERCP);  Surgeon:  Inda Castle, MD;  Location: [facility];  Service: Endoscopy;  Laterality: N/A;  . Fracture surgery      Right leg  . Chest tube insertion Right 09/12/2014    Procedure: INSERTION PLEURAL DRAINAGE CATHETER;  Surgeon: Melrose Nakayama, MD;  Location: [place];  Service: Thoracic;  Laterality: Right;   Family History  Problem Relation Age of Onset  . Heart disease Father   . Diabetes Sister   . Diabetes Daughter   . Hyperlipidemia Daughter   . Hypertension Daughter   . Other Daughter     varicose veins  . Cancer Son   . Diabetes Son   . Heart disease Son     before age 41  .  Hyperlipidemia Son   . Hypertension Son   . Heart attack Father   . Stroke Father    History  Substance Use Topics  . Smoking status: Never Smoker   . Smokeless tobacco: Current User    Types: Chew  . Alcohol Use: No   OB History    No data available     Review of Systems  Constitutional: Negative for fever and chills.  Gastrointestinal: Positive for nausea and vomiting.  Genitourinary: Positive for dysuria and flank pain.  Neurological: Negative for syncope.  All other systems reviewed and are negative.     Allergies  Levaquin; Codeine; Keflet; Morphine and related; and Oxycodone  Home Medications   Prior to Admission medications   Medication Sig Start Date End Date Taking? Authorizing Provider  acetaminophen (TYLENOL) 500 MG tablet Take 500 mg by mouth every 6 (six) hours as needed for mild pain.    Yes Historical Provider, MD  aspirin EC 81 MG tablet Take 81 mg by mouth at bedtime.    Yes Historical Provider, MD  calcitRIOL (ROCALTROL) 0.25 MCG capsule Take 0.25 mcg by mouth daily.  04/06/12  Yes Historical Provider, MD  carvedilol (COREG) 25 MG tablet Take 25 mg by mouth 2 (two) times daily. 09/15/14  Yes Historical Provider, MD  clotrimazole-betamethasone (LOTRISONE) lotion Apply 1 application topically daily as needed (apply to rash on stomach as needed).  05/25/14  Yes Historical Provider, MD  docusate sodium (COLACE) 100 MG capsule Take 100 mg by mouth daily as needed for constipation.    Yes Historical Provider, MD  epoetin alfa (EPOGEN,PROCRIT) 17001 UNIT/ML injection 20,000 Units. Every two weeks   Yes Historical Provider, MD  ferrous sulfate 325 (65 FE) MG tablet Take 1 tablet (325 mg total) by mouth 2 (two) times daily with a meal. 07/24/14  Yes Modena Jansky, MD  furosemide (LASIX) 80 MG tablet Take 1 tablet (80 mg total) by mouth 2 (two) times daily. 09/08/14  Yes [name], MD  insulin glargine (LANTUS) 100 UNIT/ML injection Inject 8 Units into the skin at  bedtime as needed (if cbg over 200).    Yes Historical Provider, MD  linagliptin (TRADJENTA) 5 MG TABS tablet Take 5 mg by mouth daily.    Yes Historical Provider, MD  LORazepam (ATIVAN) 0.5 MG tablet Take 0.5 mg by mouth 2 (two) times daily.   Yes Historical Provider, MD  meclizine (ANTIVERT) 12.5 MG tablet Take 12.5 mg by mouth as needed for dizziness.  11/23/12  Yes Historical Provider, MD  mirtazapine (REMERON) 7.5 MG tablet Take 7.5 mg by mouth at bedtime.  09/28/12  Yes Historical Provider, MD  promethazine (PHENERGAN) 12.5 MG tablet Take 1 tablet (12.5 mg total) by mouth every 6 (six)  hours as needed for nausea or vomiting. 03/14/14  Yes Ripudeep Krystal Eaton, MD  simvastatin (ZOCOR) 20 MG tablet TAKE 1 TABLET (20 MG TOTAL) BY MOUTH AT BEDTIME. 08/28/14  Yes [name], MD  traMADol (ULTRAM) 50 MG tablet Take 1 tablet (50 mg total) by mouth every 6 (six) hours as needed for moderate pain. 09/12/14  Yes Melrose Nakayama, MD  vitamin B-12 (CYANOCOBALAMIN) 1000 MCG tablet Take 1,000 mcg by mouth daily.   Yes Historical Provider, MD  HYDROcodone-acetaminophen (NORCO/VICODIN) 5-325 MG per tablet Take 1 tablet by mouth every 4 (four) hours as needed for moderate pain. 05/16/14   Orson Eva, MD   BP 156/61 mmHg  Pulse 68  Temp(Src) 98.3 F (36.8 C) (Oral)  Resp 15  SpO2 98% Physical Exam  Constitutional: She is oriented to person, place, and time. She appears well-developed and well-nourished. She appears ill.  HENT:  Head: Normocephalic and atraumatic.  Eyes: Conjunctivae are normal.  Neck: Normal range of motion. Neck supple.  Cardiovascular: Normal rate and regular rhythm.   Murmur heard. Pulmonary/Chest: Effort normal and breath sounds normal. No respiratory distress. She has no wheezes. She has no rales.  Abdominal: Soft. She exhibits no distension and no mass. There is tenderness in the right lower quadrant. There is no rigidity, no rebound and no guarding.    Musculoskeletal: Normal range  of motion.  Neurological: She is alert and oriented to person, place, and time.  Skin: Skin is warm and dry.  Psychiatric: She has a normal mood and affect. Her behavior is normal.  Nursing note and vitals reviewed.   ED Course  Procedures (including critical care time) Labs Review Labs Reviewed  CBC WITH DIFFERENTIAL/PLATELET - Abnormal; Notable for the following:    WBC 10.8 (*)    RBC 3.66 (*)    Hemoglobin 10.4 (*)    HCT 34.2 (*)    RDW 16.1 (*)    Platelets 148 (*)    Neutrophils Relative % 87 (*)    Neutro Abs 9.4 (*)    Lymphocytes Relative 9 (*)    All other components within normal limits  COMPREHENSIVE METABOLIC PANEL - Abnormal; Notable for the following:    Glucose, Bld 182 (*)    BUN 44 (*)    Creatinine, Ser 2.70 (*)    Calcium 8.6 (*)    Total Protein 5.6 (*)    Albumin 2.6 (*)    AST 14 (*)    ALT 6 (*)    GFR calc non Af Amer 15 (*)    GFR calc Af Amer 17 (*)    All other components within normal limits  CLOSTRIDIUM DIFFICILE BY PCR (NOT AT [facility])  LIPASE, BLOOD  URINALYSIS, ROUTINE W REFLEX MICROSCOPIC (NOT AT [facility])    Imaging Review Ct Abdomen Pelvis Wo Contrast  09/22/2014   CLINICAL DATA:  Nausea and vomiting since yesterday.  EXAM: CT ABDOMEN AND PELVIS WITHOUT CONTRAST  TECHNIQUE: Multidetector CT imaging of the abdomen and pelvis was performed following the standard protocol without IV contrast.  COMPARISON:  Chest CT 08/30/2014  FINDINGS: Lower chest: The lung bases demonstrate a complex right-sided hydro pneumothorax. A PleurX drainage catheter is in place. There is also a small left pleural effusion and minimal overlying atelectasis. Stable cardiac enlargement and tortuosity, ectasia and calcification of the thoracic aorta. Three-vessel coronary artery calcifications are again noted.  Hepatobiliary: No focal hepatic lesions. Pneumobilia is noted and likely due to prior sphincterotomy. Mild common  bile duct dilatation and a small amount of air the  common bowel duct. The gallbladder is distended and there are small gallstones. No pericholecystic fluid.  Pancreas: Moderate pancreatic atrophy but no mass or inflammation.  Spleen: Normal size.  No focal lesions.  Adrenals/Urinary Tract: The adrenal glands are normal. No renal calculi. Bilateral renal cysts.  Stomach/Bowel: The stomach, duodenum, small bowel and colon are grossly normal. Prior rectosigmoid resection and left-sided colostomy. There is a large parastomal hernia which contains part of the stomach, small bowel loops and colon. No obstructive findings. Stable surgical changes involving the cecum.  Vascular/Lymphatic: Advanced atherosclerotic calcifications involving the aorta and branch vessels. There are borderline mesenteric and retroperitoneal lymph nodes but no mass or overt adenopathy.  Other: The bladder appears normal. No pelvic mass or lymphadenopathy. No free pelvic fluid collections. No inguinal mass or adenopathy. Bilateral groin hernias.  Musculoskeletal: Advanced degenerative changes involving the spine and osteoporosis. Multilevel Schmorl's nodes. Could not exclude metastatic bone disease.  IMPRESSION: 1. Persistent right-sided hydropneumothorax with a PleurX drainage catheter in the right pleural space. 2. Right lower quadrant colostomy with a large parastomal hernia containing stomach, small bowel and colon. No obstruction or incarceration. 3. Pneumobilia, likely due to prior sphincterotomy. 4. Distended gallbladder with small gallstones. 5. Advanced atherosclerotic calcifications involving the aorta and branch vessels. 6. Very heterogeneous appearance of the bone marrow. Could not exclude metastatic disease or myeloma. Recommend clinical correlation.   Electronically Signed   By: Marijo Sanes M.D.   On: 09/22/2014 15:18   Dg Chest 2 View  09/22/2014   CLINICAL DATA:  Pt coming from home with chief complaint of flank pain. Pt has other multiple complaints, drain for pleural effusion,  gall bladder drain removed on May 2. Pt reports nausea and vomiting since around 0300.  EXAM: CHEST  2 VIEW  COMPARISON:  09/12/2014  FINDINGS: Right-sided chest tube has its tip near the right apex. This is new since the prior exam.  Right loculated hydro pneumothoraces project in the anterior mid to lower hemithorax. Hydro pneumothorax noted more posteriorly on the prior study is no longer evident.  Small bilateral pleural effusions, decreased on the right from the prior study. Right lower lung zone opacity is improved when compared the prior study. Left lung is essentially clear. No pulmonary edema.  No apical pneumothorax.  Cardiac silhouette is normal in size.  IMPRESSION: 1. There has been some improvement in the right lung aeration since the prior study subsequent to placement of a right-sided chest tube. There is decreased pleural fluid on the right. A posterior hydro pneumothorax on the right is no longer evident. More anterior loculated hydro pneumothoraces persist on the current study. 2. No new abnormalities.   Electronically Signed   By: Lajean Manes M.D.   On: 09/22/2014 11:50     EKG Interpretation   Date/Time:  Friday September 22 2014 10:30:18 EDT Ventricular Rate:  69 PR Interval:    QRS Duration: 94 QT Interval:  420 QTC Calculation: 450 R Axis:   111 Text Interpretation:  Atrial fibrillation Anterolateral infarct, age  indeterminate Confirmed by COOK  MD, BRIAN ([id number]) on 09/22/2014 11:11:18 AM      MDM   Final diagnoses:  Right flank pain  Intractable vomiting with nausea, vomiting of unspecified type  Patient has had colostomy bag in place for 40 years following colon cancer. She has a history of appendectomy. She had been hospitalized in April with acalculous cholecystitis and had a  drain placed but was removed on May 2. At that time they found that she had a right-sided pleural effusion and also has a Pleurx drain in place. Her vitals are stable and she is afebrile.  Her  labs are not terribly concerning at this time. She has a history of anemia as well as chronic kidney disease which is not new for her. Lipase is normal. UA is negative for UTI.chest x-ray shows some improvement right lung aeration since the chest tube was placed. She also has decreased pleural fluid on the right. No pneumothorax is present.  Recheck: Patient is still nauseated. CT abdomen shows persistent right sided hydropneumothorax with pleurx drainage catheter in place. No bowel obstruction.  Distended gallbladder with small gallstones. Possible metastatic disease or myeloma.  I spoke to Dr. Grandville Silos regarding need for admission to med surg due to patient's age and presentation of abdominal pain and intractable vomiting.    Ottie Glazier, PA-C 09/22/14 [place], MD 09/23/14 [phone]

## 2014-09-22 NOTE — H&P (Signed)
[facility] History and Physical  [name] PYP:[id number] DOB: 1925-07-28 DOA: 09/22/2014  Referring physician: Maximiano Coss, PA PCP: Natasha Miyamoto, MD  Cardiologist: Dr. Lucina Mellow  Chief Complaint: Nausea, vomiting, abdominal pain  HPI: [name] is a 81 y.o. female  With complex medical history was prior history of chronic kidney disease stage IV with placement of an AV fistula 2014, insulin-dependent diabetes mellitus, chronic diastolic heart failure, status post recent Pleurx catheter placement for drainage of recurrent symptomatic pleural effusion, remote history of colon cancer status post colostomy 40 years now, history of a calculus cholecystitis status post percutaneous drain placement in April 2016, history of severe aortic stenosis being followed by cardiology as outpatient, history of chronic A. fib not anticoagulation candidate secondary to increased risk of falls, advanced age, history of bilateral carotid disease, diabetes mellitus with diabetic retinopathy, anemia who presents to the ED with sudden onset of nausea, nonbilious, nonbloody emesis, diffuse abdominal pain which started at 3 AM on the morning of admission. Patient denies any fever or chills, no chest pain, no dysuria, no constipation. Patient does endorse chronic shortness of breath with this unchanged. Patient also endorses increased colostomy output with watery stools which have been ongoing for the past 2-3 days. Patient denies any melanoma however states has black stools secondary to being on iron pills. Patient denies any hematemesis. She denies any hematochezia. Patient with decreased oral intake on the day of admission however has had adequate oral input. Patient denies any recent antibiotic use. Patient denies any recent travel. Patient denies any recent change in diet. Patient was seen in the emergency room, company has a metabolic profile with a BUN of 44 creatinine of 2.7 glucose of 182  with abdomen of 2.6 AST of 14 ALT of 6 otherwise was within normal limits. Lipase level is within normal limits at 28. CBC had a white count of 10.8 hemoglobin of 10.4 platelet count of 148 otherwise within normal limits. Chest x-ray which was done showed some improvement in right lung and reactions since prior study subsequent to placement of right-sided chest 2. Decreased pleural effusion on the right. A posterior hydro-pneumothorax on the right is no longer evident. Small anterior loculated hydropneumothorax is persistent on the current study. No new abnormalities. CT abdomen and pelvis with persistent right-sided hydropneumothorax with a Pleurx drainage catheter in the right pleural space. Right lower quadrant colostomy with a large parastomal hernia containing stomach small bowel and colon no obstruction or incarceration. Pneumobilia likely due to prior sphincterectomy. Distended gallbladder with small gallstones. Advanced atherosclerotic calcifications involving the aorta and branch vessels. Very heterogeneous appearance of bone marrow.   Review of Systems: As per HPI o/w negative Constitutional:  No weight loss, night sweats, Fevers, chills, fatigue.  HEENT:  No headaches, Difficulty swallowing,Tooth/dental problems,Sore throat,  No sneezing, itching, ear ache, nasal congestion, post nasal drip,  Cardio-vascular:  No chest pain, Orthopnea, PND, swelling in lower extremities, anasarca, dizziness, palpitations  GI:  No heartburn, indigestion, abdominal pain, nausea, vomiting, diarrhea, change in bowel habits, loss of appetite  Resp:  No shortness of breath with exertion or at rest. No excess mucus, no productive cough, No non-productive cough, No coughing up of blood.No change in color of mucus.No wheezing.No chest wall deformity  Skin:  no rash or lesions.  GU:  no dysuria, change in color of urine, no urgency or frequency. No flank pain.  Musculoskeletal:  No joint pain or swelling. No  decreased range of motion.  No back pain.  Psych:  No change in mood or affect. No depression or anxiety. No memory loss.   Past Medical History  Diagnosis Date  . CKD (chronic kidney disease), stage IV     a. Baseline Cr reported 2.5-2.6.  Marland Kitchen Anxiety   . Diabetes mellitus     diabetic retinopathy  . Anemia   . Urinary tract infection   . Fall at home   . Colostomy care     colon ca - h/o bloody output from bag 12/2012 (ED visit)  . Colon cancer 30 year ago  . Chronic atrial fibrillation     a. not on anticoagulation due to increased risk of falls, advanced age.  . Chronic diastolic CHF (congestive heart failure)     a. Echo 10/2013: mild LVH, EF 60-65%, mod AS, mildly dilated LA, mod dilated RA, PASP 33.  Marland Kitchen Hypertension   . Carotid disease, bilateral     a. Carotid duplex 06/2351: 61-44% RICA/LICA.  Marland Kitchen H/O cardiovascular stress test     a. 2002: nuc negative for ischemia, EF 65%.  . Shortness of breath dyspnea   . Severe aortic stenosis 08/23/2014  . Heart murmur   . Depression     situational  . Headache   . Arthritis   . Recurrent pleural effusion on right    Past Surgical History  Procedure Laterality Date  . Abdominal surgery    . Abdominal hysterectomy    . Colon surgery    . Appendectomy    . Colostomy    . Cardioversion  09/30/2011    Procedure: CARDIOVERSION;  Surgeon: [name], MD;  Location: [facility];  Service: Cardiovascular;  Laterality: N/A;  . I&d extremity  11/20/2011    Procedure: IRRIGATION AND DEBRIDEMENT EXTREMITY;  Surgeon: Alta Corning, MD;  Location: [facility];  Service: Orthopedics;  Laterality: Left;  . Av fistula placement Left 06/02/2012    Procedure: ARTERIOVENOUS (AV) FISTULA CREATION;  Surgeon: Conrad Herndon, MD;  Location: [facility];  Service: Vascular;  Laterality: Left;  Bascilic Fistula  . Bascilic vein transposition Left 08/18/2012    Procedure: BASCILIC VEIN TRANSPOSITION;  Surgeon: Conrad [place], MD;  Location: [facility];  Service: Vascular;   Laterality: Left;  Left 2nd stage Basilic Vein Transposition   . Ercp N/A 07/11/2014    Procedure: ENDOSCOPIC RETROGRADE CHOLANGIOPANCREATOGRAPHY (ERCP);  Surgeon: Inda Castle, MD;  Location: [facility];  Service: Endoscopy;  Laterality: N/A;  . Biliary stent placement N/A 07/11/2014    Procedure: BILIARY STENT PLACEMENT;  Surgeon: Inda Castle, MD;  Location: [facility];  Service: Endoscopy;  Laterality: N/A;  . Ercp N/A 07/21/2014    Procedure: ENDOSCOPIC RETROGRADE CHOLANGIOPANCREATOGRAPHY (ERCP);  Surgeon: Inda Castle, MD;  Location: [facility];  Service: Endoscopy;  Laterality: N/A;  . Fracture surgery      Right leg  . Chest tube insertion Right 09/12/2014    Procedure: INSERTION PLEURAL DRAINAGE CATHETER;  Surgeon: Melrose Nakayama, MD;  Location: [facility];  Service: Thoracic;  Laterality: Right;   Social History:  reports that she has never smoked. Her smokeless tobacco use includes Chew. She reports that she does not drink alcohol or use illicit drugs.  Allergies  Allergen Reactions  . Levaquin [Levofloxacin] Nausea Only  . Codeine Nausea Only  . Keflet [Cephalexin] Nausea Only  . Morphine And Related Nausea Only  . Oxycodone Other (See Comments)    Abnormal behavior    Family History  Problem Relation Age of Onset  . Heart disease Father   . Diabetes Sister   . Diabetes Daughter   . Hyperlipidemia Daughter   . Hypertension Daughter   . Other Daughter     varicose veins  . Cancer Son   . Diabetes Son   . Heart disease Son     before age 60  . Hyperlipidemia Son   . Hypertension Son   . Heart attack Father   . Stroke Father     Prior to Admission medications   Medication Sig Start Date End Date Taking? Authorizing Provider  acetaminophen (TYLENOL) 500 MG tablet Take 500 mg by mouth every 6 (six) hours as needed for mild pain.    Yes Historical Provider, MD  aspirin EC 81 MG tablet Take 81 mg by mouth at bedtime.    Yes Historical Provider, MD    calcitRIOL (ROCALTROL) 0.25 MCG capsule Take 0.25 mcg by mouth daily.  04/06/12  Yes Historical Provider, MD  carvedilol (COREG) 25 MG tablet Take 25 mg by mouth 2 (two) times daily. 09/15/14  Yes Historical Provider, MD  clotrimazole-betamethasone (LOTRISONE) lotion Apply 1 application topically daily as needed (apply to rash on stomach as needed).  05/25/14  Yes Historical Provider, MD  docusate sodium (COLACE) 100 MG capsule Take 100 mg by mouth daily as needed for constipation.    Yes Historical Provider, MD  epoetin alfa (EPOGEN,PROCRIT) 67124 UNIT/ML injection 20,000 Units. Every two weeks   Yes Historical Provider, MD  ferrous sulfate 325 (65 FE) MG tablet Take 1 tablet (325 mg total) by mouth 2 (two) times daily with a meal. 07/24/14  Yes Modena Jansky, MD  furosemide (LASIX) 80 MG tablet Take 1 tablet (80 mg total) by mouth 2 (two) times daily. 09/08/14  Yes [name], MD  insulin glargine (LANTUS) 100 UNIT/ML injection Inject 8 Units into the skin at bedtime as needed (if cbg over 200).    Yes Historical Provider, MD  linagliptin (TRADJENTA) 5 MG TABS tablet Take 5 mg by mouth daily.    Yes Historical Provider, MD  LORazepam (ATIVAN) 0.5 MG tablet Take 0.5 mg by mouth 2 (two) times daily.   Yes Historical Provider, MD  meclizine (ANTIVERT) 12.5 MG tablet Take 12.5 mg by mouth as needed for dizziness.  11/23/12  Yes Historical Provider, MD  mirtazapine (REMERON) 7.5 MG tablet Take 7.5 mg by mouth at bedtime.  09/28/12  Yes Historical Provider, MD  promethazine (PHENERGAN) 12.5 MG tablet Take 1 tablet (12.5 mg total) by mouth every 6 (six) hours as needed for nausea or vomiting. 03/14/14  Yes Ripudeep Krystal Eaton, MD  simvastatin (ZOCOR) 20 MG tablet TAKE 1 TABLET (20 MG TOTAL) BY MOUTH AT BEDTIME. 08/28/14  Yes [name], MD  traMADol (ULTRAM) 50 MG tablet Take 1 tablet (50 mg total) by mouth every 6 (six) hours as needed for moderate pain. 09/12/14  Yes Melrose Nakayama, MD  vitamin B-12  (CYANOCOBALAMIN) 1000 MCG tablet Take 1,000 mcg by mouth daily.   Yes Historical Provider, MD  HYDROcodone-acetaminophen (NORCO/VICODIN) 5-325 MG per tablet Take 1 tablet by mouth every 4 (four) hours as needed for moderate pain. 05/16/14   Orson Eva, MD   Physical Exam: Filed Vitals:   09/22/14 1345 09/22/14 1400 09/22/14 1415 09/22/14 1512  BP: 155/56 152/84 154/62 156/61  Pulse: 57 67 65 68  Temp:      TempSrc:      Resp: 14 16 22  15  SpO2: 98% 98% 99% 98%    Wt Readings from Last 3 Encounters:  09/20/14 79.379 kg (175 lb)  09/15/14 78.019 kg (172 lb)  09/12/14 79.379 kg (175 lb)    General: Elderly female laying on gurney in no acute cardiac or pulmonary distress. Speaking in full sentences.  Eyes: PERRLA, EOMI, normal lids, irises & conjunctiva ENT: grossly normal hearing, lips & tongue. Dry mucous membranes Neck: no LAD, masses or thyromegaly Cardiovascular: Irregularly irregular, with a 3/6 systolic ejection murmur. Chronic 2+ bilateral lower extremity edema per patient. Telemetry: Atrial fibrillation Respiratory: CTA bilaterally, no w/r/r. Normal respiratory effort. Abdomen: soft, nontender, nondistended, positive bowel sounds. Colostomy bag with no stool noted. Patient emptied out back prior to, to the hospital. Bandage over right-sided Pleurx catheter. Skin: no rash or induration seen on limited exam Musculoskeletal: grossly normal tone BUE/BLE Psychiatric: grossly normal mood and affect, speech fluent and appropriate Neurologic: Alert and oriented 3. Cranial nerves II through XII are grossly intact. No focal deficits.          Labs on Admission:  Basic Metabolic Panel:  Recent Labs Lab 09/22/14 1137  NA 140  K 3.8  CL 104  CO2 26  GLUCOSE 182*  BUN 44*  CREATININE 2.70*  CALCIUM 8.6*   Liver Function Tests:  Recent Labs Lab 09/22/14 1137  AST 14*  ALT 6*  ALKPHOS 106  BILITOT 0.5  PROT 5.6*  ALBUMIN 2.6*    Recent Labs Lab 09/22/14 1137    LIPASE 28   No results for input(s): AMMONIA in the last 168 hours. CBC:  Recent Labs Lab 09/22/14 1137  WBC 10.8*  NEUTROABS 9.4*  HGB 10.4*  HCT 34.2*  MCV 93.4  PLT 148*   Cardiac Enzymes: No results for input(s): CKTOTAL, CKMB, CKMBINDEX, TROPONINI in the last 168 hours.  BNP (last 3 results)  Recent Labs  03/15/14 0914 07/14/14 1125  BNP 941.4* 526.1*    ProBNP (last 3 results)  Recent Labs  09/08/14 1517  PROBNP 312.0*    CBG: No results for input(s): GLUCAP in the last 168 hours.  Radiological Exams on Admission: Ct Abdomen Pelvis Wo Contrast  09/22/2014   CLINICAL DATA:  Nausea and vomiting since yesterday.  EXAM: CT ABDOMEN AND PELVIS WITHOUT CONTRAST  TECHNIQUE: Multidetector CT imaging of the abdomen and pelvis was performed following the standard protocol without IV contrast.  COMPARISON:  Chest CT 08/30/2014  FINDINGS: Lower chest: The lung bases demonstrate a complex right-sided hydro pneumothorax. A PleurX drainage catheter is in place. There is also a small left pleural effusion and minimal overlying atelectasis. Stable cardiac enlargement and tortuosity, ectasia and calcification of the thoracic aorta. Three-vessel coronary artery calcifications are again noted.  Hepatobiliary: No focal hepatic lesions. Pneumobilia is noted and likely due to prior sphincterotomy. Mild common bile duct dilatation and a small amount of air the common bowel duct. The gallbladder is distended and there are small gallstones. No pericholecystic fluid.  Pancreas: Moderate pancreatic atrophy but no mass or inflammation.  Spleen: Normal size.  No focal lesions.  Adrenals/Urinary Tract: The adrenal glands are normal. No renal calculi. Bilateral renal cysts.  Stomach/Bowel: The stomach, duodenum, small bowel and colon are grossly normal. Prior rectosigmoid resection and left-sided colostomy. There is a large parastomal hernia which contains part of the stomach, small bowel loops and  colon. No obstructive findings. Stable surgical changes involving the cecum.  Vascular/Lymphatic: Advanced atherosclerotic calcifications involving the aorta and branch vessels. There  are borderline mesenteric and retroperitoneal lymph nodes but no mass or overt adenopathy.  Other: The bladder appears normal. No pelvic mass or lymphadenopathy. No free pelvic fluid collections. No inguinal mass or adenopathy. Bilateral groin hernias.  Musculoskeletal: Advanced degenerative changes involving the spine and osteoporosis. Multilevel Schmorl's nodes. Could not exclude metastatic bone disease.  IMPRESSION: 1. Persistent right-sided hydropneumothorax with a PleurX drainage catheter in the right pleural space. 2. Right lower quadrant colostomy with a large parastomal hernia containing stomach, small bowel and colon. No obstruction or incarceration. 3. Pneumobilia, likely due to prior sphincterotomy. 4. Distended gallbladder with small gallstones. 5. Advanced atherosclerotic calcifications involving the aorta and branch vessels. 6. Very heterogeneous appearance of the bone marrow. Could not exclude metastatic disease or myeloma. Recommend clinical correlation.   Electronically Signed   By: Marijo Sanes M.D.   On: 09/22/2014 15:18   Dg Chest 2 View  09/22/2014   CLINICAL DATA:  Pt coming from home with chief complaint of flank pain. Pt has other multiple complaints, drain for pleural effusion, gall bladder drain removed on May 2. Pt reports nausea and vomiting since around 0300.  EXAM: CHEST  2 VIEW  COMPARISON:  09/12/2014  FINDINGS: Right-sided chest tube has its tip near the right apex. This is new since the prior exam.  Right loculated hydro pneumothoraces project in the anterior mid to lower hemithorax. Hydro pneumothorax noted more posteriorly on the prior study is no longer evident.  Small bilateral pleural effusions, decreased on the right from the prior study. Right lower lung zone opacity is improved when compared  the prior study. Left lung is essentially clear. No pulmonary edema.  No apical pneumothorax.  Cardiac silhouette is normal in size.  IMPRESSION: 1. There has been some improvement in the right lung aeration since the prior study subsequent to placement of a right-sided chest tube. There is decreased pleural fluid on the right. A posterior hydro pneumothorax on the right is no longer evident. More anterior loculated hydro pneumothoraces persist on the current study. 2. No new abnormalities.   Electronically Signed   By: Lajean Manes M.D.   On: 09/22/2014 11:50    EKG: Independently reviewed: Atrial fibrillation  Assessment/Plan Principal Problem:   Nausea, vomiting and diarrhea Active Problems:   Anemia   Diabetes mellitus, type II   Hypertension   Chronic atrial fibrillation   Chronic diastolic CHF (congestive heart failure)   CKD (chronic kidney disease), stage IV   Severe aortic stenosis   Type 2 diabetes mellitus with diabetic chronic kidney disease   Pleural effusion, right   DNR (do not resuscitate)   Flank pain   Dehydration   Abdominal pain   Leukocytosis  #1 nausea/ vomiting/ diarrhea Consent for possible viral versus bacterial gastroenteritis. Patient presented with sudden onset nausea vomiting and 2 to three-day history of increased loose stools and increased ostomy output per patient and family. Patient denies any recent antibiotic use. Positive for possible C. difficile colitis infection. Check a C. difficile PCR. Place on scheduled Zofran. Place on oxygen, gentle hydration, antiemetics, pain management, enteric precautions. Follow.  #2 dehydration Secondary to problem #1. IV fluids.  #3 leukocytosis Likely reactive leukocytosis secondary to problem #1. Daily CBC. Follow.  #4 Type 2 diabetes mellitus Check a hemoglobin A1c. Place on sliding scale insulin. Hold oral hypoglycemic agents.  #5 severe aortic stenosis Continue cardiac medications. Outpatient follow-up  with cardiology.  #6 chronic diastolic CHF Stable. Will hold diuretics today secondary to #1.  Gentle hydration.  #7 right pleural effusion recurrent status post Pleurx catheter placement Continued to drain Pleurx catheter every 2 days. Outpatient follow-up.  #8 prophylaxis PPI for GI prophylaxis. Heparin for DVT prophylaxis.  Code Status: DO NOT RESUSCITATE DVT Prophylaxis: Lovenox. Family Communication: Updated patient and children at bedside. Disposition Plan: Admit to [facility].  Time spent: [facility] Pager [phone]

## 2014-09-23 DIAGNOSIS — D72829 Elevated white blood cell count, unspecified: Secondary | ICD-10-CM

## 2014-09-23 DIAGNOSIS — E860 Dehydration: Secondary | ICD-10-CM

## 2014-09-23 LAB — CBC
HCT: 35.4 % — ABNORMAL LOW (ref 36.0–46.0)
HEMOGLOBIN: 10.8 g/dL — AB (ref 12.0–15.0)
MCH: 28.3 pg (ref 26.0–34.0)
MCHC: 30.5 g/dL (ref 30.0–36.0)
MCV: 92.9 fL (ref 78.0–100.0)
Platelets: 181 10*3/uL (ref 150–400)
RBC: 3.81 MIL/uL — ABNORMAL LOW (ref 3.87–5.11)
RDW: 16 % — ABNORMAL HIGH (ref 11.5–15.5)
WBC: 20.6 10*3/uL — AB (ref 4.0–10.5)

## 2014-09-23 LAB — COMPREHENSIVE METABOLIC PANEL
ALT: 7 U/L — ABNORMAL LOW (ref 14–54)
AST: 14 U/L — ABNORMAL LOW (ref 15–41)
Albumin: 2.5 g/dL — ABNORMAL LOW (ref 3.5–5.0)
Alkaline Phosphatase: 103 U/L (ref 38–126)
Anion gap: 11 (ref 5–15)
BUN: 41 mg/dL — ABNORMAL HIGH (ref 6–20)
CO2: 25 mmol/L (ref 22–32)
Calcium: 8.2 mg/dL — ABNORMAL LOW (ref 8.9–10.3)
Chloride: 101 mmol/L (ref 101–111)
Creatinine, Ser: 2.51 mg/dL — ABNORMAL HIGH (ref 0.44–1.00)
GFR calc Af Amer: 19 mL/min — ABNORMAL LOW (ref >60)
GFR, EST NON AFRICAN AMERICAN: 16 mL/min — AB (ref >60)
GLUCOSE: 149 mg/dL — AB (ref 65–99)
Potassium: 3.6 mmol/L (ref 3.5–5.1)
Sodium: 137 mmol/L (ref 135–145)
Total Bilirubin: 0.5 mg/dL (ref 0.3–1.2)
Total Protein: 5.6 g/dL — ABNORMAL LOW (ref 6.5–8.1)

## 2014-09-23 LAB — GLUCOSE, CAPILLARY
GLUCOSE-CAPILLARY: 136 mg/dL — AB (ref 65–99)
GLUCOSE-CAPILLARY: 172 mg/dL — AB (ref 65–99)
Glucose-Capillary: 134 mg/dL — ABNORMAL HIGH (ref 65–99)
Glucose-Capillary: 165 mg/dL — ABNORMAL HIGH (ref 65–99)

## 2014-09-23 LAB — HEMOGLOBIN A1C
Hgb A1c MFr Bld: 6.5 % — ABNORMAL HIGH (ref 4.8–5.6)
Mean Plasma Glucose: 140 mg/dL

## 2014-09-23 LAB — CLOSTRIDIUM DIFFICILE BY PCR: CDIFFPCR: POSITIVE — AB

## 2014-09-23 MED ORDER — SODIUM CHLORIDE 0.9 % IV SOLN
INTRAVENOUS | Status: DC
  Administered 2014-09-23 – 2014-09-24 (×3): via INTRAVENOUS

## 2014-09-23 MED ORDER — VANCOMYCIN 50 MG/ML ORAL SOLUTION
125.0000 mg | Freq: Four times a day (QID) | ORAL | Status: DC
  Administered 2014-09-23 – 2014-09-28 (×22): 125 mg via ORAL
  Filled 2014-09-23 (×27): qty 2.5

## 2014-09-23 MED ORDER — SODIUM CHLORIDE 0.9 % IV BOLUS (SEPSIS)
500.0000 mL | Freq: Once | INTRAVENOUS | Status: AC
  Administered 2014-09-23: 500 mL via INTRAVENOUS

## 2014-09-23 NOTE — Progress Notes (Signed)
Utilization review completed.  

## 2014-09-23 NOTE — Progress Notes (Addendum)
PROGRESS NOTE    [name] IFO:[id number] DOB: April 22, 1925 DOA: 09/22/2014 PCP: Abigail Miyamoto, MD  Cardiologist: Dr. Lucina Mellow  HPI/Brief narrative 81 year old female with history of stage D severe symptomatic aortic stenosis, stage IV CKD with nonfunctioning AV fistula, chronic right pleural effusion with an indwelling drainage catheter, IDDM/type II DM, chronic A. fib not an anticoagulation candidate because of fall risk, debilitation/weakness, chronic diastolic CHF, remote colon cancer history status post colostomy, cardiologist extensively discussed on 09/19/14 with patient and family regarding options for her aortic stenosis and patient is considering TAVR despite being made aware of all risks-cardiologist consulting with another cardiologist, nephrologist and TCTS prior to follow-up. She was admitted on 09/22/14 with sudden onset of nausea, nonbloody emesis, abdominal pain and increased colostomy output. No hematemesis or bleeding reported. In the ED, creatinine 2.7, lipase normal, hemoglobin 10.4, normal WBC, chest x-ray showed some improvement in right lung radiation since the prior study subsequent to placement of right-sided chest tube and no new abnormalities, CT abdomen and pelvis without contrast showed persistent right hydropneumothorax, LLQ colostomy with large parastomal hernia containing stomach, small bowel and colon but no obstruction or incarceration and no acute findings. She was admitted for possible acute GE.   Assessment/Plan:  Possible acute viral gastroenteritis - CT abdomen without contrast showed no acute findings. - Treated supportively with bowel rest, gentle IV fluids and antiemetics. - Clinically improved and denies any further nausea, vomiting and colostomy output seems to have decreased. Tolerated liquid diets this morning. - Advance diet as tolerated.  Dehydration - Improved. Reduce IV fluids.  Stage IV chronic kidney disease - Creatinine at  baseline  Type II DM/IDDM - Held oral hypoglycemics. Continue SSI. - Controlled.  Chronic diastolic CHF - Stable. Minimize IV fluids  Severe symptomatic aortic stenosis - Cardiology is coordinating care with multiple specialists. Patient and family considering TAVR  Chronic/recurrent right pleural effusion, s/p Pleurx catheter placement - Continue to drain Pleurx catheter every 2 days and outpatient follow-up with thoracic surgery. Improved clinically and radiologically.  Chronic A. Fib - Rate controlled. Not anticoagulation candidate.  Anemia - Stable  Leukocytosis - May be stress response. Follow in a.m.    DVT prophylaxis: Subcutaneous heparin Code Status: DO NOT RESUSCITATE Family Communication: Discussed with daughter [name] on 09/23/14 Disposition Plan: DC home possibly in the next 24 hours   Consultants:  None  Procedures:  Has right upper quadrant Pleurx catheter PTA  Left-sided colostomy PTA  Antibiotics:  None   Subjective: Feels much better. No further nausea, vomiting and colostomy output decreased to usual. Denies abdominal pain. Tolerated liquid diet this morning.  Objective: Filed Vitals:   09/22/14 1700 09/22/14 1746 09/22/14 2129 09/23/14 0607  BP: 146/62 150/63 137/57 102/50  Pulse: 69 72 72 72  Temp:  98.3 F (36.8 C) 98.1 F (36.7 C) 99.1 F (37.3 C)  TempSrc:  Oral Oral Oral  Resp: 13 16 19 19   SpO2: 96% 98% 97% 95%    Intake/Output Summary (Last 24 hours) at 09/23/14 1210 Last data filed at 09/23/14 0850  Gross per 24 hour  Intake    240 ml  Output    900 ml  Net   -660 ml   There were no vitals filed for this visit.   Exam:  General exam: Pleasant elderly female sitting up comfortably in bed without distress. Respiratory system: Clear anteriorly. Coarse Velcro-like crackles right lung fields posteriorly. No increased work of breathing. Cardiovascular system: S1 &  S2 heard, RRR. No JVD, murmurs, gallops, clicks or  pedal edema. Gastrointestinal system: Abdomen is nondistended, soft. Minimal tenderness in RUQ without guarding or peritoneal signs. Normal bowel sounds heard. Colostomy bag empty this morning. Central nervous system: Alert and oriented. No focal neurological deficits. Extremities: Symmetric 5 x 5 power.   Data Reviewed: Basic Metabolic Panel:  Recent Labs Lab 09/22/14 1137 09/22/14 2232 09/23/14 0325  NA 140  --  137  K 3.8  --  3.6  CL 104  --  101  CO2 26  --  25  GLUCOSE 182*  --  149*  BUN 44*  --  41*  CREATININE 2.70*  --  2.51*  CALCIUM 8.6*  --  8.2*  MG  --  1.9  --    Liver Function Tests:  Recent Labs Lab 09/22/14 1137 09/23/14 0325  AST 14* 14*  ALT 6* 7*  ALKPHOS 106 103  BILITOT 0.5 0.5  PROT 5.6* 5.6*  ALBUMIN 2.6* 2.5*    Recent Labs Lab 09/22/14 1137  LIPASE 28   No results for input(s): AMMONIA in the last 168 hours. CBC:  Recent Labs Lab 09/22/14 1137 09/23/14 0325  WBC 10.8* 20.6*  NEUTROABS 9.4*  --   HGB 10.4* 10.8*  HCT 34.2* 35.4*  MCV 93.4 92.9  PLT 148* 181   Cardiac Enzymes: No results for input(s): CKTOTAL, CKMB, CKMBINDEX, TROPONINI in the last 168 hours. BNP (last 3 results)  Recent Labs  09/08/14 1517  PROBNP 312.0*   CBG:  Recent Labs Lab 09/22/14 1815 09/23/14 0759  GLUCAP 134* 134*    No results found for this or any previous visit (from the past 240 hour(s)).      Studies: Ct Abdomen Pelvis Wo Contrast  09/22/2014   CLINICAL DATA:  Nausea and vomiting since yesterday.  EXAM: CT ABDOMEN AND PELVIS WITHOUT CONTRAST  TECHNIQUE: Multidetector CT imaging of the abdomen and pelvis was performed following the standard protocol without IV contrast.  COMPARISON:  Chest CT 08/30/2014  FINDINGS: Lower chest: The lung bases demonstrate a complex right-sided hydro pneumothorax. A PleurX drainage catheter is in place. There is also a small left pleural effusion and minimal overlying atelectasis. Stable cardiac  enlargement and tortuosity, ectasia and calcification of the thoracic aorta. Three-vessel coronary artery calcifications are again noted.  Hepatobiliary: No focal hepatic lesions. Pneumobilia is noted and likely due to prior sphincterotomy. Mild common bile duct dilatation and a small amount of air the common bowel duct. The gallbladder is distended and there are small gallstones. No pericholecystic fluid.  Pancreas: Moderate pancreatic atrophy but no mass or inflammation.  Spleen: Normal size.  No focal lesions.  Adrenals/Urinary Tract: The adrenal glands are normal. No renal calculi. Bilateral renal cysts.  Stomach/Bowel: The stomach, duodenum, small bowel and colon are grossly normal. Prior rectosigmoid resection and left-sided colostomy. There is a large parastomal hernia which contains part of the stomach, small bowel loops and colon. No obstructive findings. Stable surgical changes involving the cecum.  Vascular/Lymphatic: Advanced atherosclerotic calcifications involving the aorta and branch vessels. There are borderline mesenteric and retroperitoneal lymph nodes but no mass or overt adenopathy.  Other: The bladder appears normal. No pelvic mass or lymphadenopathy. No free pelvic fluid collections. No inguinal mass or adenopathy. Bilateral groin hernias.  Musculoskeletal: Advanced degenerative changes involving the spine and osteoporosis. Multilevel Schmorl's nodes. Could not exclude metastatic bone disease.  IMPRESSION: 1. Persistent right-sided hydropneumothorax with a PleurX drainage catheter in the right pleural  space. 2. Right lower quadrant colostomy with a large parastomal hernia containing stomach, small bowel and colon. No obstruction or incarceration. 3. Pneumobilia, likely due to prior sphincterotomy. 4. Distended gallbladder with small gallstones. 5. Advanced atherosclerotic calcifications involving the aorta and branch vessels. 6. Very heterogeneous appearance of the bone marrow. Could not  exclude metastatic disease or myeloma. Recommend clinical correlation.   Electronically Signed   By: Marijo Sanes M.D.   On: 09/22/2014 15:18   Dg Chest 2 View  09/22/2014   CLINICAL DATA:  Pt coming from home with chief complaint of flank pain. Pt has other multiple complaints, drain for pleural effusion, gall bladder drain removed on May 2. Pt reports nausea and vomiting since around 0300.  EXAM: CHEST  2 VIEW  COMPARISON:  09/12/2014  FINDINGS: Right-sided chest tube has its tip near the right apex. This is new since the prior exam.  Right loculated hydro pneumothoraces project in the anterior mid to lower hemithorax. Hydro pneumothorax noted more posteriorly on the prior study is no longer evident.  Small bilateral pleural effusions, decreased on the right from the prior study. Right lower lung zone opacity is improved when compared the prior study. Left lung is essentially clear. No pulmonary edema.  No apical pneumothorax.  Cardiac silhouette is normal in size.  IMPRESSION: 1. There has been some improvement in the right lung aeration since the prior study subsequent to placement of a right-sided chest tube. There is decreased pleural fluid on the right. A posterior hydro pneumothorax on the right is no longer evident. More anterior loculated hydro pneumothoraces persist on the current study. 2. No new abnormalities.   Electronically Signed   By: Lajean Manes M.D.   On: 09/22/2014 11:50        Scheduled Meds: . aspirin EC  81 mg Oral QHS  . calcitRIOL  0.25 mcg Oral Daily  . carvedilol  25 mg Oral BID WC  . ferrous sulfate  325 mg Oral BID WC  . heparin  5,000 Units Subcutaneous 3 times per day  . insulin aspart  0-9 Units Subcutaneous TID WC  . LORazepam  0.5 mg Oral BID  . mirtazapine  7.5 mg Oral QHS  . ondansetron  4 mg Intravenous 3 times per day  . simvastatin  20 mg Oral q1800  . vitamin B-12  1,000 mcg Oral Daily   Continuous Infusions: . sodium chloride Stopped (09/23/14 0549)     Principal Problem:   Nausea, vomiting and diarrhea Active Problems:   Anemia   Diabetes mellitus, type II   Hypertension   Chronic atrial fibrillation   Chronic diastolic CHF (congestive heart failure)   CKD (chronic kidney disease), stage IV   Severe aortic stenosis   Type 2 diabetes mellitus with diabetic chronic kidney disease   Pleural effusion, right   DNR (do not resuscitate)   Flank pain   Dehydration   Abdominal pain   Leukocytosis    Time spent: 10 minutes    Tinzlee Craker, MD, FACP, FHM. [facility] Pager [phone]  If 7PM-7AM, please contact night-coverage [email or web address] Password [email or web address] 09/23/2014, 12:10 PM    LOS: 1 day

## 2014-09-23 NOTE — Evaluation (Addendum)
Physical Therapy Evaluation Patient Details Name: [name] MRN: [id number] DOB: 04-Jan-1926 Today's Date: 09/23/2014   History of Present Illness  Pt. admitted 09/22/14 with nausea, vomiting and abdominal pain.   Has history of stage D severe symptomatic aortic stenosis, stage IV CKD with nonfunctioning AV fistula, chronic right pleural effusion with an indwelling drainage catheter, IDDM/type II DM, chronic A. fib not an anticoagulation candidate because of fall risk, debilitation/weakness, chronic diastolic CHF, remote colon cancer history status post colostomy. CT abdomen and pelvis without contrast showed persistent right hydropneumothorax, LLQ colostomy with large parastomal hernia containing stomach, small bowel and colon but no obstruction or incarceration and no acute findings. She was admitted for possible acute GE   Clinical Impression  Pt. Presents to PT with a decreased awareness of safety during  mobility, decreased activity tolerance compared to her baseline and will benefit from acute PT to address these and below areas for return home with 24 hour family assist.  Pt's daughter states she does not believe pt. needs HHPT at this juncture and that she follows closely along behind pt. at home.  She denies any pt. falls.  Will see to advance pt's independence and safety in mobility.      Follow Up Recommendations No PT follow up;Supervision/Assistance - 24 hour;Supervision for mobility/OOB    Equipment Recommendations  None recommended by PT (pt. has needed equipment in the home already)    Recommendations for Other Services       Precautions / Restrictions Precautions Precautions: Fall Precaution Comments: Daughter [name] denies pt. falls; pt. moves quickly and not in safe manner so appears at a fall risk Restrictions Weight Bearing Restrictions: No      Mobility  Bed Mobility Overal bed mobility: Needs Assistance Bed Mobility: Sit to Supine       Sit to supine: Min  guard   General bed mobility comments: sit to supine with min gaurd assist for safety  Transfers Overall transfer level: Needs assistance Equipment used: Rolling walker (2 wheeled) Transfers: Sit to/from Stand Sit to Stand: Min guard         General transfer comment: pt. moves quickly and in unsafe manner, unaware of need to manage IV pole.  Pt. needs reinforcement for correct hand placement and min guard assist for safe transitions  Ambulation/Gait Ambulation/Gait assistance: Min assist Ambulation Distance (Feet): 20 Feet (10' x 2) Assistive device: Rolling walker (2 wheeled) Gait Pattern/deviations: Step-through pattern;Trunk flexed;Narrow base of support Gait velocity: increased, unsafe   General Gait Details: Pt. moves too qiuickly, often trying to park RW off to side.  Able to walk into bathroom for + urination before returning to her bed.  Pt. fell asleep quickly once back in bed.  Stairs            Wheelchair Mobility    Modified Rankin (Stroke Patients Only)       Balance Overall balance assessment: Needs assistance Sitting-balance support: No upper extremity supported;Feet supported Sitting balance-Leahy Scale: Good     Standing balance support: Bilateral upper extremity supported;During functional activity   Standing balance comment: needs support of RW for stable standing                              Pertinent Vitals/Pain Pain Assessment: No/denies pain    Home Living Family/patient expects to be discharged to:: Private residence Living Arrangements: Children Available Help at Discharge: Family;Available 24 hours/day Type of Home:  House Home Access: Stairs to enter   [employer] of Steps: 5 Home Layout: One level Home Equipment: [place] - 2 wheels;Walker - 4 wheels;Bedside commode;Shower seat;Cane - single point      Prior Function Level of Independence: Independent with assistive device(s)         Comments: Uses  rollator.      Hand Dominance   Dominant Hand: Right    Extremity/Trunk Assessment   Upper Extremity Assessment: Generalized weakness           Lower Extremity Assessment: Generalized weakness      Cervical / Trunk Assessment: Normal  Communication   Communication: No difficulties  Cognition Arousal/Alertness: Awake/alert Behavior During Therapy: WFL for tasks assessed/performed Overall Cognitive Status: Within Functional Limits for tasks assessed                      General Comments      Exercises        Assessment/Plan    PT Assessment Patient needs continued PT services  PT Diagnosis Difficulty walking;Generalized weakness   PT Problem List Decreased strength;Decreased activity tolerance;Decreased balance;Decreased mobility;Decreased safety awareness;Decreased knowledge of use of DME;Decreased knowledge of precautions  PT Treatment Interventions DME instruction;Gait training;Functional mobility training;Therapeutic activities;Therapeutic exercise;Balance training;Patient/family education   PT Goals (Current goals can be found in the Care Plan section) Acute Rehab PT Goals Patient Stated Goal: pt. did not state PT Goal Formulation: With patient/family Time For Goal Achievement: 09/30/14 Potential to Achieve Goals: Good    Frequency Min 3X/week   Barriers to discharge        Co-evaluation               End of Session Equipment Utilized During Treatment: Gait belt Activity Tolerance: Patient tolerated treatment well Patient left: in bed;with call bell/phone within reach;with bed alarm set;with family/visitor present (daughter [name] present) Nurse Communication: Mobility status         Time: 7824-2353 PT Time Calculation (min) (ACUTE ONLY): 18 min   Charges:   PT Evaluation $Initial PT Evaluation Tier I: 1 Procedure     PT G CodesLadona Ridgel 09/23/2014, 3:05 PM Gerlean Ren PT Acute Rehab Services  [place] [phone]

## 2014-09-23 NOTE — Progress Notes (Signed)
OT Cancellation Note  Patient Details Name: DARILYN STORBECK MRN: [id number] DOB: [birth year]   Cancelled Treatment:    Reason Eval/Treat Not Completed: Other (comment). Pt not agreeable to get up with OT and reports she is in pain and just got back in bed not long ago.  Benito Mccreedy OTR/L [phone] 09/23/2014, 3:53 PM

## 2014-09-23 NOTE — Consult Note (Signed)
WOC ostomy consult note Stoma type/location: LLQ colostomy Stomal assessment/size: 1 and 1/4 x 1 and 3/4 inch oval, flush, moving  Peristomal assessment: intact, clear Treatment options for stomal/peristomal skin: none indicated Output brown/green stool Ostomy pouching: 1pc.  Education provided: Patient and daughter observed ostomy sizing and note that ostomy is larger than os of precut current equipment.  Demonstrated that stretching out sides of 1 and 1/2 inch [place] pouches can be done until current supply is used, but that with next supply order, patient should obtain 2 inch pouches.  Patient and daughter are appreciative of ostomy RN visit.  Five pouches in the 2-inch size are provided for patient to try at home. Enrolled patient in [facility] program: No WOC nursing team will not follow, but will remain available to this patient, the nursing and medical teams.  Please re-consult if needed. Thanks, Maudie Flakes, MSN, RN, [place], [place], [place] [phone])

## 2014-09-24 DIAGNOSIS — I959 Hypotension, unspecified: Secondary | ICD-10-CM

## 2014-09-24 LAB — GLUCOSE, CAPILLARY
GLUCOSE-CAPILLARY: 151 mg/dL — AB (ref 65–99)
GLUCOSE-CAPILLARY: 119 mg/dL — AB (ref 65–99)
Glucose-Capillary: 114 mg/dL — ABNORMAL HIGH (ref 65–99)
Glucose-Capillary: 123 mg/dL — ABNORMAL HIGH (ref 65–99)

## 2014-09-24 LAB — CBC
HCT: 29.9 % — ABNORMAL LOW (ref 36.0–46.0)
Hemoglobin: 9.1 g/dL — ABNORMAL LOW (ref 12.0–15.0)
MCH: 28.1 pg (ref 26.0–34.0)
MCHC: 30.4 g/dL (ref 30.0–36.0)
MCV: 92.3 fL (ref 78.0–100.0)
PLATELETS: 142 10*3/uL — AB (ref 150–400)
RBC: 3.24 MIL/uL — AB (ref 3.87–5.11)
RDW: 16.3 % — AB (ref 11.5–15.5)
WBC: 13.8 10*3/uL — ABNORMAL HIGH (ref 4.0–10.5)

## 2014-09-24 MED ORDER — SODIUM CHLORIDE 0.9 % IV SOLN
INTRAVENOUS | Status: DC
  Administered 2014-09-24 – 2014-09-25 (×2): via INTRAVENOUS

## 2014-09-24 MED ORDER — SACCHAROMYCES BOULARDII 250 MG PO CAPS
250.0000 mg | ORAL_CAPSULE | Freq: Two times a day (BID) | ORAL | Status: DC
  Administered 2014-09-24 – 2014-09-29 (×11): 250 mg via ORAL
  Filled 2014-09-24 (×13): qty 1

## 2014-09-24 MED ORDER — SODIUM CHLORIDE 0.9 % IV SOLN: INTRAVENOUS | Status: DC

## 2014-09-24 NOTE — Progress Notes (Signed)
PROGRESS NOTE    [name] HEN:[id number] DOB: Nov 04, 1925 DOA: 09/22/2014 PCP: Abigail Miyamoto, MD  Cardiologist: Dr. Lucina Mellow  HPI/Brief narrative 81 year old female with history of stage D severe symptomatic aortic stenosis, stage IV CKD with nonfunctioning AV fistula, chronic right pleural effusion with an indwelling drainage catheter, IDDM/type II DM, chronic A. fib not an anticoagulation candidate because of fall risk, debilitation/weakness, chronic diastolic CHF, remote colon cancer history status post colostomy, cardiologist extensively discussed on 09/19/14 with patient and family regarding options for her aortic stenosis and patient is considering TAVR despite being made aware of all risks-cardiologist consulting with another cardiologist, nephrologist and TCTS prior to follow-up. She was admitted on 09/22/14 with sudden onset of nausea, nonbloody emesis, abdominal pain and increased colostomy output. No hematemesis or bleeding reported. In the ED, creatinine 2.7, lipase normal, hemoglobin 10.4, normal WBC, chest x-ray showed some improvement in right lung radiation since the prior study subsequent to placement of right-sided chest tube and no new abnormalities, CT abdomen and pelvis without contrast showed persistent right hydropneumothorax, LLQ colostomy with large parastomal hernia containing stomach, small bowel and colon but no obstruction or incarceration and no acute findings. She was admitted for possible acute GE.   Assessment/Plan:  C. difficile diarrhea/colitis - CT abdomen without contrast showed no acute findings. - Treated supportively with bowel rest, gentle IV fluids and antiemetics. - Clinically improved and denies any further nausea, vomiting and colostomy output seems to have decreased. Tolerating diet. - Advance diet as tolerated. - Mild RUQ pain around Pleurx catheter but improving. - Started oral vancomycin 7/2 and complete 14 days' treatment. - Started  probiotics. Minimize unnecessary antibiotic use.  Dehydration - Improved. Apparently having decreased urine output and hypotension overnight, possibly still volume depleted. Continue IV fluids for additional 24 hours.  Hypotension - Overnight 09/23/14 had blood pressure of 79/58. Improved with IV fluid hydration. Continue IV fluids for additional 24 hours.  Stage IV chronic kidney disease - Creatinine at baseline - Low urine output may be related to volume depletion. IV fluids and follow BMP in a.m.  Type II DM/IDDM - Held oral hypoglycemics. Continue SSI. - Controlled.  Chronic diastolic CHF - Stable. Clinically volume depleted.  Severe symptomatic aortic stenosis - Cardiology is coordinating care with multiple specialists. Patient and family considering TAVR  Chronic/recurrent right pleural effusion, s/p Pleurx catheter placement - Continue to drain Pleurx catheter every 2 days and outpatient follow-up with thoracic surgery. Improved clinically and radiologically. - As per nursing, Pleurx catheter was drained on 7/2 for 350 mL fluid.  Chronic A. Fib - Rate controlled. Not anticoagulation candidate.  Anemia - Hemoglobin has slightly dropped. Follow CBC  Leukocytosis -Most likely secondary to C. difficile. Improving.    DVT prophylaxis: Subcutaneous heparin Code Status: DO NOT RESUSCITATE Family Communication: Discussed with daughter [name] on 09/23/14 Disposition Plan: DC home when medically stable   Consultants:  None  Procedures:  Has right upper quadrant Pleurx catheter PTA  Left-sided colostomy PTA  Antibiotics:  Oral vancomycin 7/2 >  Subjective: Tolerating diet. Output through colostomy decreasing-black color but on iron supplements. Soreness around right Pleurx catheter site improved. Overnight events noted-decreased urine output and transient hypotension, improved after IV fluid bolus.   Objective: Filed Vitals:   09/23/14 1355 09/23/14 2216  09/23/14 2356 09/24/14 0546  BP: 148/65 79/58 100/47 105/54  Pulse: 77 85 84 72  Temp: 98.2 F (36.8 C) 100 F (37.8 C) 100.1 F (37.8 C) 99  F (37.2 C)  TempSrc: Oral Oral Oral Oral  Resp: 18 17 20 17   Weight:    83.19 kg (183 lb 6.4 oz)  SpO2: 98% 94% 92% 99%    Intake/Output Summary (Last 24 hours) at 09/24/14 1202 Last data filed at 09/24/14 0263  Gross per 24 hour  Intake   1673 ml  Output   1150 ml  Net    523 ml   Filed Weights   09/24/14 0546  Weight: 83.19 kg (183 lb 6.4 oz)     Exam:  General exam: Pleasant elderly female sitting up comfortably in bed without distress. Respiratory system: Clear anteriorly. Coarse Velcro-like crackles right lung fields posteriorly. No increased work of breathing. Cardiovascular system: S1 & S2 heard, RRR. No JVD, murmurs, gallops, clicks or pedal edema. Gastrointestinal system: Abdomen is nondistended, soft. Minimal tenderness in RUQ without guarding or peritoneal signs-improved. Normal bowel sounds heard. Colostomy bag -black liquid stool, small amount. Central nervous system: Alert and oriented. No focal neurological deficits. Extremities: Symmetric 5 x 5 power.   Data Reviewed: Basic Metabolic Panel:  Recent Labs Lab 09/22/14 1137 09/22/14 2232 09/23/14 0325  NA 140  --  137  K 3.8  --  3.6  CL 104  --  101  CO2 26  --  25  GLUCOSE 182*  --  149*  BUN 44*  --  41*  CREATININE 2.70*  --  2.51*  CALCIUM 8.6*  --  8.2*  MG  --  1.9  --    Liver Function Tests:  Recent Labs Lab 09/22/14 1137 09/23/14 0325  AST 14* 14*  ALT 6* 7*  ALKPHOS 106 103  BILITOT 0.5 0.5  PROT 5.6* 5.6*  ALBUMIN 2.6* 2.5*    Recent Labs Lab 09/22/14 1137  LIPASE 28   No results for input(s): AMMONIA in the last 168 hours. CBC:  Recent Labs Lab 09/22/14 1137 09/23/14 0325 09/24/14 0410  WBC 10.8* 20.6* 13.8*  NEUTROABS 9.4*  --   --   HGB 10.4* 10.8* 9.1*  HCT 34.2* 35.4* 29.9*  MCV 93.4 92.9 92.3  PLT 148* 181 142*    Cardiac Enzymes: No results for input(s): CKTOTAL, CKMB, CKMBINDEX, TROPONINI in the last 168 hours. BNP (last 3 results)  Recent Labs  09/08/14 1517  PROBNP 312.0*   CBG:  Recent Labs Lab 09/23/14 0759 09/23/14 1209 09/23/14 1603 09/23/14 2214 09/24/14 0804  GLUCAP 134* 165* 136* 172* 114*    Recent Results (from the past 240 hour(s))  Clostridium Difficile by PCR (not at [facility])     Status: Abnormal   Collection Time: 09/23/14  2:00 PM  Result Value Ref Range Status   C difficile by pcr POSITIVE (A) NEGATIVE Final    Comment: CRITICAL RESULT CALLED TO, READ BACK BY AND VERIFIED WITH: RN CAIN,Z AT [phone] MARTINB         Studies: Ct Abdomen Pelvis Wo Contrast  09/22/2014   CLINICAL DATA:  Nausea and vomiting since yesterday.  EXAM: CT ABDOMEN AND PELVIS WITHOUT CONTRAST  TECHNIQUE: Multidetector CT imaging of the abdomen and pelvis was performed following the standard protocol without IV contrast.  COMPARISON:  Chest CT 08/30/2014  FINDINGS: Lower chest: The lung bases demonstrate a complex right-sided hydro pneumothorax. A PleurX drainage catheter is in place. There is also a small left pleural effusion and minimal overlying atelectasis. Stable cardiac enlargement and tortuosity, ectasia and calcification of the thoracic aorta. Three-vessel coronary artery calcifications are again noted.  Hepatobiliary: No focal hepatic lesions. Pneumobilia is noted and likely due to prior sphincterotomy. Mild common bile duct dilatation and a small amount of air the common bowel duct. The gallbladder is distended and there are small gallstones. No pericholecystic fluid.  Pancreas: Moderate pancreatic atrophy but no mass or inflammation.  Spleen: Normal size.  No focal lesions.  Adrenals/Urinary Tract: The adrenal glands are normal. No renal calculi. Bilateral renal cysts.  Stomach/Bowel: The stomach, duodenum, small bowel and colon are grossly normal. Prior rectosigmoid resection and  left-sided colostomy. There is a large parastomal hernia which contains part of the stomach, small bowel loops and colon. No obstructive findings. Stable surgical changes involving the cecum.  Vascular/Lymphatic: Advanced atherosclerotic calcifications involving the aorta and branch vessels. There are borderline mesenteric and retroperitoneal lymph nodes but no mass or overt adenopathy.  Other: The bladder appears normal. No pelvic mass or lymphadenopathy. No free pelvic fluid collections. No inguinal mass or adenopathy. Bilateral groin hernias.  Musculoskeletal: Advanced degenerative changes involving the spine and osteoporosis. Multilevel Schmorl's nodes. Could not exclude metastatic bone disease.  IMPRESSION: 1. Persistent right-sided hydropneumothorax with a PleurX drainage catheter in the right pleural space. 2. Right lower quadrant colostomy with a large parastomal hernia containing stomach, small bowel and colon. No obstruction or incarceration. 3. Pneumobilia, likely due to prior sphincterotomy. 4. Distended gallbladder with small gallstones. 5. Advanced atherosclerotic calcifications involving the aorta and branch vessels. 6. Very heterogeneous appearance of the bone marrow. Could not exclude metastatic disease or myeloma. Recommend clinical correlation.   Electronically Signed   By: Marijo Sanes M.D.   On: 09/22/2014 15:18        Scheduled Meds: . aspirin EC  81 mg Oral QHS  . calcitRIOL  0.25 mcg Oral Daily  . carvedilol  25 mg Oral BID WC  . ferrous sulfate  325 mg Oral BID WC  . heparin  5,000 Units Subcutaneous 3 times per day  . insulin aspart  0-9 Units Subcutaneous TID WC  . LORazepam  0.5 mg Oral BID  . mirtazapine  7.5 mg Oral QHS  . ondansetron  4 mg Intravenous 3 times per day  . simvastatin  20 mg Oral q1800  . vancomycin  125 mg Oral QID  . vitamin B-12  1,000 mcg Oral Daily   Continuous Infusions: . sodium chloride 100 mL/hr at 09/24/14 0941    Principal Problem:    Nausea, vomiting and diarrhea Active Problems:   Anemia   Diabetes mellitus, type II   Hypertension   Chronic atrial fibrillation   Chronic diastolic CHF (congestive heart failure)   CKD (chronic kidney disease), stage IV   Severe aortic stenosis   Type 2 diabetes mellitus with diabetic chronic kidney disease   Pleural effusion, right   DNR (do not resuscitate)   Flank pain   Dehydration   Abdominal pain   Leukocytosis    Time spent: 106 minutes    HONGALGI,ANAND, MD, FACP, FHM. [facility] Pager [phone]  If 7PM-7AM, please contact night-coverage [email or web address] Password [email or web address] 09/24/2014, 12:02 PM    LOS: 2 days

## 2014-09-24 NOTE — Progress Notes (Signed)
Patients B/P dropped to 79/58, HR 85, Temp 100.0, pulse ox 94. MD T. Rogue Bussing notified and an order for 500cc bolus was given. Will continue to monitor. Jimmie Molly, RN

## 2014-09-25 ENCOUNTER — Inpatient Hospital Stay (HOSPITAL_COMMUNITY): Payer: Medicare Other

## 2014-09-25 DIAGNOSIS — N189 Chronic kidney disease, unspecified: Secondary | ICD-10-CM

## 2014-09-25 DIAGNOSIS — E1122 Type 2 diabetes mellitus with diabetic chronic kidney disease: Secondary | ICD-10-CM

## 2014-09-25 DIAGNOSIS — R0600 Dyspnea, unspecified: Secondary | ICD-10-CM

## 2014-09-25 DIAGNOSIS — N179 Acute kidney failure, unspecified: Secondary | ICD-10-CM

## 2014-09-25 LAB — BASIC METABOLIC PANEL
Anion gap: 7 (ref 5–15)
BUN: 48 mg/dL — AB (ref 6–20)
CO2: 21 mmol/L — AB (ref 22–32)
Calcium: 7.9 mg/dL — ABNORMAL LOW (ref 8.9–10.3)
Chloride: 108 mmol/L (ref 101–111)
Creatinine, Ser: 3.33 mg/dL — ABNORMAL HIGH (ref 0.44–1.00)
GFR calc non Af Amer: 12 mL/min — ABNORMAL LOW (ref >60)
GFR, EST AFRICAN AMERICAN: 13 mL/min — AB (ref >60)
Glucose, Bld: 91 mg/dL (ref 65–99)
Potassium: 4.3 mmol/L (ref 3.5–5.1)
Sodium: 136 mmol/L (ref 135–145)

## 2014-09-25 LAB — CBC
HCT: 30.1 % — ABNORMAL LOW (ref 36.0–46.0)
HEMOGLOBIN: 9.1 g/dL — AB (ref 12.0–15.0)
MCH: 28.7 pg (ref 26.0–34.0)
MCHC: 30.2 g/dL (ref 30.0–36.0)
MCV: 95 fL (ref 78.0–100.0)
Platelets: 128 10*3/uL — ABNORMAL LOW (ref 150–400)
RBC: 3.17 MIL/uL — ABNORMAL LOW (ref 3.87–5.11)
RDW: 16.2 % — AB (ref 11.5–15.5)
WBC: 9.9 10*3/uL (ref 4.0–10.5)

## 2014-09-25 LAB — GLUCOSE, CAPILLARY
Glucose-Capillary: 106 mg/dL — ABNORMAL HIGH (ref 65–99)
Glucose-Capillary: 148 mg/dL — ABNORMAL HIGH (ref 65–99)
Glucose-Capillary: 129 mg/dL — ABNORMAL HIGH (ref 65–99)

## 2014-09-25 MED ORDER — FUROSEMIDE 10 MG/ML IJ SOLN
40.0000 mg | Freq: Once | INTRAMUSCULAR | Status: AC
  Administered 2014-09-25: 40 mg via INTRAVENOUS
  Filled 2014-09-25: qty 4

## 2014-09-25 NOTE — Progress Notes (Signed)
Physical Therapy Treatment Patient Details Name: LAREN ORAMA MRN: [id number] DOB: 03-Mar-1926 Today's Date: 09/25/2014    History of Present Illness Pt. admitted 09/22/14 with nausea, vomiting and abdominal pain.      PT Comments    Pt with improved ambulation tolerance and use of walker this session. Pt c/o SOB however SpO2 at >94% on RA during ambulation and at rest.  Follow Up Recommendations  No PT follow up;Supervision/Assistance - 24 hour;Supervision for mobility/OOB     Equipment Recommendations  None recommended by PT    Recommendations for Other Services       Precautions / Restrictions Precautions Precautions: Fall Restrictions Weight Bearing Restrictions: No    Mobility  Bed Mobility               General bed mobility comments: pt up in chair upon PT arrival  Transfers Overall transfer level: Needs assistance Equipment used: Rolling walker (2 wheeled) Transfers: Sit to/from Stand Sit to Stand: Min guard         General transfer comment: pt with good use of hands, not impulsive like eval  Ambulation/Gait Ambulation/Gait assistance: Min guard Ambulation Distance (Feet): 80 Feet Assistive device: Rolling walker (2 wheeled) Gait Pattern/deviations: Step-through pattern (increased UE WBing) Gait velocity: more appropriate than eval   General Gait Details: v/c's for safe walker management during turning but demo'd safe cadence    Stairs            Wheelchair Mobility    Modified Rankin (Stroke Patients Only)       Balance Overall balance assessment: Needs assistance         Standing balance support: Single extremity supported Standing balance-Leahy Scale: Fair Standing balance comment: able to complete hygiene after tolieting with unilateral UE support                    Cognition Arousal/Alertness: Awake/alert Behavior During Therapy: WFL for tasks assessed/performed Overall Cognitive Status: Within Functional Limits  for tasks assessed                      Exercises      General Comments General comments (skin integrity, edema, etc.): SpO2 >94% on RA during ambulation and at rest      Pertinent Vitals/Pain Pain Assessment: 0-10 Pain Score: 5  Pain Location: sternum area    Home Living                      Prior Function            PT Goals (current goals can now be found in the care plan section) Acute Rehab PT Goals Patient Stated Goal: pt. did not state Progress towards PT goals: Progressing toward goals    Frequency  Min 3X/week    PT Plan Current plan remains appropriate    Co-evaluation             End of Session Equipment Utilized During Treatment: Gait belt Activity Tolerance: Patient tolerated treatment well Patient left: in chair;with call bell/phone within reach     Time: 0953-1012 PT Time Calculation (min) (ACUTE ONLY): 19 min  Charges:  $Gait Training: 8-22 mins                    G Codes:      Kingsley Callander 09/25/2014, 10:38 AM  Kittie Plater, PT, DPT Pager #: [phone] Office #: [phone]

## 2014-09-25 NOTE — Evaluation (Signed)
Occupational Therapy Evaluation Patient Details Name: [name] MRN: [id number] DOB: 1925-05-03 Today's Date: 09/25/2014    History of Present Illness Pt. admitted 09/22/14 with nausea, vomiting and abdominal pain.     Clinical Impression   Patient evaluated by Occupational Therapy with no further acute OT needs identified. All education has been completed and the patient has no further questions. See below for any follow-up Occupational Therapy or equipment needs. OT to sign off. Thank you for referral.      Follow Up Recommendations  No OT follow up    Equipment Recommendations  None recommended by OT    Recommendations for Other Services       Precautions / Restrictions Precautions Precautions: Fall      Mobility Bed Mobility               General bed mobility comments: in chair on arrival  Transfers Overall transfer level: Needs assistance Equipment used: Rolling walker (2 wheeled) Transfers: Sit to/from Stand Sit to Stand: Supervision              Balance                                            ADL Overall ADL's : At baseline                                             Vision     Perception     Praxis      Pertinent Vitals/Pain       Hand Dominance Right   Extremity/Trunk Assessment Upper Extremity Assessment Upper Extremity Assessment: Generalized weakness   Lower Extremity Assessment Lower Extremity Assessment: Generalized weakness   Cervical / Trunk Assessment Cervical / Trunk Assessment: Normal   Communication Communication Communication: No difficulties   Cognition Arousal/Alertness: Awake/alert Behavior During Therapy: WFL for tasks assessed/performed Overall Cognitive Status: Within Functional Limits for tasks assessed                     General Comments       Exercises       Shoulder Instructions      Home Living Family/patient expects to be discharged  to:: Private residence Living Arrangements: Children                 Bathroom Shower/Tub: Tub/shower unit;Curtain Shower/tub characteristics: Architectural technologist: Handicapped height                Prior Functioning/Environment Level of Independence: Independent with assistive device(s)        Comments: Uses rollator.     OT Diagnosis: Generalized weakness   OT Problem List:     OT Treatment/Interventions:      OT Goals(Current goals can be found in the care plan section) Acute Rehab OT Goals Patient Stated Goal: to see my grandchildren OT Goal Formulation: With patient Potential to Achieve Goals: Good  OT Frequency:     Barriers to D/C:            Co-evaluation              End of Session Equipment Utilized During Treatment: Gait belt Nurse Communication: Mobility status;Precautions  Activity Tolerance: Patient tolerated treatment well Patient left:  in chair;with call bell/phone within reach   Time: 1356-1418 OT Time Calculation (min): 22 min Charges:  OT General Charges $OT Visit: 1 Procedure OT Evaluation $Initial OT Evaluation Tier I: 1 Procedure G-Codes: OT G-codes **NOT FOR INPATIENT CLASS** Functional Assessment Tool Used:     Peri Maris 09/25/2014, 2:37 PM Pager: [phone]

## 2014-09-25 NOTE — Progress Notes (Signed)
Colostomy pouch total output of 132mL for day shift.

## 2014-09-25 NOTE — Progress Notes (Signed)
PROGRESS NOTE    [name] XIP:[id number] DOB: 25-Nov-1925 DOA: 09/22/2014 PCP: Abigail Miyamoto, MD  Cardiologist: Dr. Lucina Mellow  HPI/Brief narrative 81 year old female with history of stage D severe symptomatic aortic stenosis, stage IV CKD with nonfunctioning AV fistula, chronic right pleural effusion with an indwelling drainage catheter, IDDM/type II DM, chronic A. fib not an anticoagulation candidate because of fall risk, debilitation/weakness, chronic diastolic CHF, remote colon cancer history status post colostomy, cardiologist extensively discussed on 09/19/14 with patient and family regarding options for her aortic stenosis and patient is considering TAVR despite being made aware of all risks-cardiologist consulting with another cardiologist, nephrologist and TCTS prior to follow-up. She was admitted on 09/22/14 with sudden onset of nausea, nonbloody emesis, abdominal pain and increased colostomy output. No hematemesis or bleeding reported. In the ED, creatinine 2.7, lipase normal, hemoglobin 10.4, normal WBC, chest x-ray showed some improvement in right lung radiation since the prior study subsequent to placement of right-sided chest tube and no new abnormalities, CT abdomen and pelvis without contrast showed persistent right hydropneumothorax, LLQ colostomy with large parastomal hernia containing stomach, small bowel and colon but no obstruction or incarceration and no acute findings. She was admitted for possible acute GE.   Assessment/Plan:  C. difficile diarrhea/colitis - CT abdomen without contrast showed no acute findings. - Treated supportively with bowel rest, gentle IV fluids and antiemetics. - Started oral vancomycin 7/2 and complete 14 days' treatment. - Started probiotics. Minimize unnecessary antibiotic use. - Nausea, vomiting and abdominal pain improved. Still having copious colostomy output. - Continue current management and if does not improve, consider GI  consultation  Dehydration -Hydrated with IV fluids. Improved. Developed dyspnea overnight 7/3,?? Pulmonary edema-chest x-ray however does not suggest so. DC IV fluids.  Hypotension - Overnight 09/23/14 had blood pressure of 79/58. Resolved after IV fluids.  Stage IV chronic kidney disease - Baseline creatinine 2.4 - Creatinine has increased to 3.3 from 2.5. Possibly related to dehydration earlier/? ATN. DC the IV fluids secondary to concern for pulmonary edema. Trial of a dose of IV Lasix. Follow BMP in a.m. and if continues to increase, consult nephrology.  Type II DM/IDDM - Held oral hypoglycemics. Continue SSI. - Controlled.  Chronic diastolic CHF - ? Mild pulmonary edema after IV fluids. Single dose of IV Lasix today and follow.  Severe symptomatic aortic stenosis - Cardiology is coordinating care with multiple specialists. Patient and family considering TAVR  Chronic/recurrent right pleural effusion, s/p Pleurx catheter placement - Continue to drain Pleurx catheter every 2 days and outpatient follow-up with thoracic surgery. Improved clinically and radiologically. - As per nursing, Pleurx catheter was drained on 7/2 for 350 mL fluid. Requested nursing to drain pleural fluid 7/4.  Chronic A. Fib - Rate controlled. Not anticoagulation candidate.  Anemia - Hemoglobin has slightly dropped. Stable  Leukocytosis -Most likely secondary to C. difficile. Resolved  Mild thrombocytopenia - Probably due to acute infection. Monitor  Failure to thrive - Due to advanced age, frail health and multiple severe significant medical problems. Discussed at length with patient's daughter Ms. Gerilyn Nestle and offered palliative care consultation to establish goals of care. She stated that she would discuss with patient and get back to [place].   DVT prophylaxis: Subcutaneous heparin Code Status: DO NOT RESUSCITATE Family Communication: Discussed with daughter [name] on 09/25/14 Disposition Plan:  DC home when medically stable   Consultants:  None  Procedures:  Has right upper quadrant Pleurx catheter PTA  Left-sided colostomy PTA  Antibiotics:  Oral vancomycin 7/2 >  Subjective: States that he still has copious colostomy output. An episode of nausea overnight. No abdominal pain reported. Overnight dyspnea.   Objective: Filed Vitals:   09/24/14 0546 09/24/14 1336 09/24/14 2244 09/25/14 0545  BP: 105/54 111/46 122/51 158/66  Pulse: 72 70 60 71  Temp: 99 F (37.2 C) 98.5 F (36.9 C) 97.6 F (36.4 C) 98.5 F (36.9 C)  TempSrc: Oral Oral Oral Oral  Resp: 17 18 18 24   Weight: 83.19 kg (183 lb 6.4 oz)   84.1 kg (185 lb 6.5 oz)  SpO2: 99% 100% 100% 96%    Intake/Output Summary (Last 24 hours) at 09/25/14 1525 Last data filed at 09/25/14 1200  Gross per 24 hour  Intake 878.75 ml  Output    775 ml  Net 103.75 ml   Filed Weights   09/24/14 0546 09/25/14 0545  Weight: 83.19 kg (183 lb 6.4 oz) 84.1 kg (185 lb 6.5 oz)     Exam:  General exam: Pleasant elderly female sitting up comfortably in chair this morning working with PT. Appeared quite comfortable despite complaining of dyspnea. Respiratory system: Diminished breath sounds bilaterally, right >left with scattered coarse Velcro-like right-sided chronic sounding crackles. Few left basal crackles. No increased work of breathing. Oxygen saturation in the 90s on room air with activity. Cardiovascular system: S1 & S2 heard, RRR. No JVD, murmurs, gallops, clicks or pedal edema. Gastrointestinal system: Abdomen is nondistended, soft and nontender. Normal bowel sounds heard. Colostomy bag -black liquid stool, small amount. Central nervous system: Alert and oriented. No focal neurological deficits. Extremities: Symmetric 5 x 5 power.   Data Reviewed: Basic Metabolic Panel:  Recent Labs Lab 09/22/14 1137 09/22/14 2232 09/23/14 0325 09/25/14 0350  NA 140  --  137 136  K 3.8  --  3.6 4.3  CL 104  --  101 108    CO2 26  --  25 21*  GLUCOSE 182*  --  149* 91  BUN 44*  --  41* 48*  CREATININE 2.70*  --  2.51* 3.33*  CALCIUM 8.6*  --  8.2* 7.9*  MG  --  1.9  --   --    Liver Function Tests:  Recent Labs Lab 09/22/14 1137 09/23/14 0325  AST 14* 14*  ALT 6* 7*  ALKPHOS 106 103  BILITOT 0.5 0.5  PROT 5.6* 5.6*  ALBUMIN 2.6* 2.5*    Recent Labs Lab 09/22/14 1137  LIPASE 28   No results for input(s): AMMONIA in the last 168 hours. CBC:  Recent Labs Lab 09/22/14 1137 09/23/14 0325 09/24/14 0410 09/25/14 0350  WBC 10.8* 20.6* 13.8* 9.9  NEUTROABS 9.4*  --   --   --   HGB 10.4* 10.8* 9.1* 9.1*  HCT 34.2* 35.4* 29.9* 30.1*  MCV 93.4 92.9 92.3 95.0  PLT 148* 181 142* 128*   Cardiac Enzymes: No results for input(s): CKTOTAL, CKMB, CKMBINDEX, TROPONINI in the last 168 hours. BNP (last 3 results)  Recent Labs  09/08/14 1517  PROBNP 312.0*   CBG:  Recent Labs Lab 09/24/14 1304 09/24/14 1659 09/24/14 2141 09/25/14 0755 09/25/14 1200  GLUCAP 151* 119* 123* 106* 148*    Recent Results (from the past 240 hour(s))  Clostridium Difficile by PCR (not at [facility])     Status: Abnormal   Collection Time: 09/23/14  2:00 PM  Result Value Ref Range Status   C difficile by pcr POSITIVE (A) NEGATIVE Final    Comment: CRITICAL RESULT  CALLED TO, READ BACK BY AND VERIFIED WITH: RN CAIN,Z AT [id number] MARTINB         Studies: Dg Chest Port 1 View  09/25/2014   CLINICAL DATA:  Nausea, vomiting, shortness of Breath  EXAM: PORTABLE CHEST - 1 VIEW  COMPARISON:  09/22/2014  FINDINGS: Right PleurX catheter in place. There are small bilateral pleural effusions, right slightly greater than left. Left effusion is new since prior study. Bibasilar atelectasis or infiltrates, right greater than left. This is increased since prior study. Heart is likely upper limits normal in size.  IMPRESSION: Small bilateral pleural effusions with bibasilar atelectasis or infiltrates, right greater than  left. Right PleurX catheter again noted.   Electronically Signed   By: Rolm Baptise M.D.   On: 09/25/2014 10:30        Scheduled Meds: . aspirin EC  81 mg Oral QHS  . calcitRIOL  0.25 mcg Oral Daily  . carvedilol  25 mg Oral BID WC  . ferrous sulfate  325 mg Oral BID WC  . heparin  5,000 Units Subcutaneous 3 times per day  . insulin aspart  0-9 Units Subcutaneous TID WC  . LORazepam  0.5 mg Oral BID  . mirtazapine  7.5 mg Oral QHS  . ondansetron  4 mg Intravenous 3 times per day  . saccharomyces boulardii  250 mg Oral BID  . simvastatin  20 mg Oral q1800  . vancomycin  125 mg Oral QID  . vitamin B-12  1,000 mcg Oral Daily   Continuous Infusions:    Principal Problem:   Nausea, vomiting and diarrhea Active Problems:   Anemia   Diabetes mellitus, type II   Hypertension   Chronic atrial fibrillation   Chronic diastolic CHF (congestive heart failure)   CKD (chronic kidney disease), stage IV   Severe aortic stenosis   Type 2 diabetes mellitus with diabetic chronic kidney disease   Pleural effusion, right   DNR (do not resuscitate)   Flank pain   Dehydration   Abdominal pain   Leukocytosis    Time spent: 43 minutes    Maninder Deboer, MD, FACP, FHM. [facility] Pager [phone]  If 7PM-7AM, please contact night-coverage [email or web address] Password [email or web address] 09/25/2014, 3:25 PM    LOS: 3 days

## 2014-09-25 NOTE — Progress Notes (Signed)
PleurX drain output 450 mL, clear orange discoloration.

## 2014-09-25 NOTE — Care Management Note (Signed)
Case Management Note  Patient Details  Name: [name] MRN: [id number] Date of Birth: 02-Jul-1925  Subjective/Objective:                    Action/Plan:  Patient active with [facility] prior to admission . Resumption of care orders entered.  Expected Discharge Date:                  Expected Discharge Plan:  [place]  In-House Referral:     Discharge planning Services  CM Consult  Post Acute Care Choice:  Home Health Choice offered to:  Patient  DME Arranged:    DME Agency:     HH Arranged:  RN [place] Agency:  [place]  Status of Service:  In process, will continue to follow  Medicare Important Message Given:    Date Medicare IM Given:    Medicare IM give by:    Date Additional Medicare IM Given:    Additional Medicare Important Message give by:     If discussed at [place] of Stay Meetings, dates discussed:    Additional Comments:  Marilu Favre, RN 09/25/2014, 1:59 PM

## 2014-09-26 DIAGNOSIS — R627 Adult failure to thrive: Secondary | ICD-10-CM

## 2014-09-26 LAB — BASIC METABOLIC PANEL
Anion gap: 8 (ref 5–15)
BUN: 49 mg/dL — ABNORMAL HIGH (ref 6–20)
CALCIUM: 7.9 mg/dL — AB (ref 8.9–10.3)
CO2: 24 mmol/L (ref 22–32)
CREATININE: 3.28 mg/dL — AB (ref 0.44–1.00)
Chloride: 106 mmol/L (ref 101–111)
GFR calc Af Amer: 14 mL/min — ABNORMAL LOW (ref >60)
GFR calc non Af Amer: 12 mL/min — ABNORMAL LOW (ref >60)
GLUCOSE: 128 mg/dL — AB (ref 65–99)
POTASSIUM: 4.2 mmol/L (ref 3.5–5.1)
Sodium: 138 mmol/L (ref 135–145)

## 2014-09-26 LAB — GLUCOSE, CAPILLARY
GLUCOSE-CAPILLARY: 134 mg/dL — AB (ref 65–99)
Glucose-Capillary: 173 mg/dL — ABNORMAL HIGH (ref 65–99)
Glucose-Capillary: 117 mg/dL — ABNORMAL HIGH (ref 65–99)
Glucose-Capillary: 130 mg/dL — ABNORMAL HIGH (ref 65–99)
Glucose-Capillary: 149 mg/dL — ABNORMAL HIGH (ref 65–99)

## 2014-09-26 LAB — CBC
HEMATOCRIT: 30.3 % — AB (ref 36.0–46.0)
Hemoglobin: 9.2 g/dL — ABNORMAL LOW (ref 12.0–15.0)
MCH: 28.1 pg (ref 26.0–34.0)
MCHC: 30.4 g/dL (ref 30.0–36.0)
MCV: 92.7 fL (ref 78.0–100.0)
PLATELETS: 152 10*3/uL (ref 150–400)
RBC: 3.27 MIL/uL — ABNORMAL LOW (ref 3.87–5.11)
RDW: 15.8 % — AB (ref 11.5–15.5)
WBC: 7.8 10*3/uL (ref 4.0–10.5)

## 2014-09-26 MED ORDER — DARBEPOETIN ALFA 40 MCG/0.4ML IJ SOSY
40.0000 ug | PREFILLED_SYRINGE | INTRAMUSCULAR | Status: DC
  Administered 2014-09-26: 40 ug via SUBCUTANEOUS
  Filled 2014-09-26 (×2): qty 0.4

## 2014-09-26 MED ORDER — SODIUM CHLORIDE 0.9 % IV SOLN
510.0000 mg | Freq: Once | INTRAVENOUS | Status: AC
  Administered 2014-09-26: 510 mg via INTRAVENOUS
  Filled 2014-09-26: qty 17

## 2014-09-26 MED ORDER — SODIUM CHLORIDE 0.9 % IV SOLN
INTRAVENOUS | Status: AC
  Administered 2014-09-26: 12:00:00 via INTRAVENOUS

## 2014-09-26 NOTE — Consult Note (Signed)
Reason for Consult: Acute renal failure on CKD stage IV Referring Physician: Vernell Leep M.D. [facility])   HPI:  81 year old Caucasian woman with past medical history significant for diabetes, hypertension and chronic kidney disease stage IV (baseline creatinine ranging 2.5-2.7) with a history of a failed left basilic vein transposition fistula and recent questions being raised regarding her candidacy for dialysis. She has a declining functional baseline status and multiple medical problems including chronic diastolic heart failure status post Pleurx catheter placement for recurrent pleural effusions, severe aortic stenosis, atrial fibrillation not on anticoagulation due to fall risk and remote colon cancer status post colostomy.  She was admitted 4 days ago with complaints of nausea, vomiting and abdominal pain without any associated hematemesis/blood in colostomy output. No other constitutional complaints such as fevers or chills and she denied any chest pain or shortness of breath. Concern is raised with her rising creatinine that was 2.7 on admission, 2.5 on 7/2, 3.3 on 7/4 and today is 3.3. Urine output is charted as nonoliguric and she is overall net negative since admission. (Intravenous fluids have been used with caution due to her tenuous cardiac status). She reports that she has continued to feel better over the last 2 days and inquires about going home.  Past Medical History  Diagnosis Date  . CKD (chronic kidney disease), stage IV     a. Baseline Cr reported 2.5-2.6.  Marland Kitchen Anxiety   . Diabetes mellitus     diabetic retinopathy  . Anemia   . Urinary tract infection   . Fall at home   . Colostomy care     colon ca - h/o bloody output from bag 12/2012 (ED visit)  . Colon cancer 30 year ago  . Chronic atrial fibrillation     a. not on anticoagulation due to increased risk of falls, advanced age.  . Chronic diastolic CHF (congestive heart failure)     a. Echo 10/2013: mild LVH, EF 60-65%,  mod AS, mildly dilated LA, mod dilated RA, PASP 33.  Marland Kitchen Hypertension   . Carotid disease, bilateral     a. Carotid duplex 06/3327: 51-88% RICA/LICA.  Marland Kitchen H/O cardiovascular stress test     a. 2002: nuc negative for ischemia, EF 65%.  . Shortness of breath dyspnea   . Severe aortic stenosis 08/23/2014  . Heart murmur   . Depression     situational  . Headache   . Arthritis   . Recurrent pleural effusion on right     Past Surgical History  Procedure Laterality Date  . Abdominal surgery    . Abdominal hysterectomy    . Colon surgery    . Appendectomy    . Colostomy    . Cardioversion  09/30/2011    Procedure: CARDIOVERSION;  Surgeon: [name], MD;  Location: [facility];  Service: Cardiovascular;  Laterality: N/A;  . I&d extremity  11/20/2011    Procedure: IRRIGATION AND DEBRIDEMENT EXTREMITY;  Surgeon: Alta Corning, MD;  Location: [place];  Service: Orthopedics;  Laterality: Left;  . Av fistula placement Left 06/02/2012    Procedure: ARTERIOVENOUS (AV) FISTULA CREATION;  Surgeon: Conrad Perth, MD;  Location: [facility];  Service: Vascular;  Laterality: Left;  Bascilic Fistula  . Bascilic vein transposition Left 08/18/2012    Procedure: BASCILIC VEIN TRANSPOSITION;  Surgeon: Conrad Karnak, MD;  Location: [facility];  Service: Vascular;  Laterality: Left;  Left 2nd stage Basilic Vein Transposition   . Ercp N/A 07/11/2014    Procedure:  ENDOSCOPIC RETROGRADE CHOLANGIOPANCREATOGRAPHY (ERCP);  Surgeon: Inda Castle, MD;  Location: [facility];  Service: Endoscopy;  Laterality: N/A;  . Biliary stent placement N/A 07/11/2014    Procedure: BILIARY STENT PLACEMENT;  Surgeon: Inda Castle, MD;  Location: [facility];  Service: Endoscopy;  Laterality: N/A;  . Ercp N/A 07/21/2014    Procedure: ENDOSCOPIC RETROGRADE CHOLANGIOPANCREATOGRAPHY (ERCP);  Surgeon: Inda Castle, MD;  Location: [facility];  Service: Endoscopy;  Laterality: N/A;  . Fracture surgery      Right leg  . Chest tube insertion Right  09/12/2014    Procedure: INSERTION PLEURAL DRAINAGE CATHETER;  Surgeon: Melrose Nakayama, MD;  Location: [facility];  Service: Thoracic;  Laterality: Right;    Family History  Problem Relation Age of Onset  . Heart disease Father   . Diabetes Sister   . Diabetes Daughter   . Hyperlipidemia Daughter   . Hypertension Daughter   . Other Daughter     varicose veins  . Cancer Son   . Diabetes Son   . Heart disease Son     before age 59  . Hyperlipidemia Son   . Hypertension Son   . Heart attack Father   . Stroke Father     Social History:  reports that she has never smoked. Her smokeless tobacco use includes Chew. She reports that she does not drink alcohol or use illicit drugs.  Allergies:  Allergies  Allergen Reactions  . Levaquin [Levofloxacin] Nausea Only  . Codeine Nausea Only  . Keflet [Cephalexin] Nausea Only  . Morphine And Related Nausea Only  . Oxycodone Other (See Comments)    Abnormal behavior    Medications:  Scheduled: . aspirin EC  81 mg Oral QHS  . calcitRIOL  0.25 mcg Oral Daily  . carvedilol  25 mg Oral BID WC  . ferrous sulfate  325 mg Oral BID WC  . heparin  5,000 Units Subcutaneous 3 times per day  . insulin aspart  0-9 Units Subcutaneous TID WC  . LORazepam  0.5 mg Oral BID  . mirtazapine  7.5 mg Oral QHS  . ondansetron  4 mg Intravenous 3 times per day  . saccharomyces boulardii  250 mg Oral BID  . simvastatin  20 mg Oral q1800  . vancomycin  125 mg Oral QID  . vitamin B-12  1,000 mcg Oral Daily    BMP Latest Ref Rng 09/26/2014 09/25/2014 09/23/2014  Glucose 65 - 99 mg/dL 128(H) 91 149(H)  BUN 6 - 20 mg/dL 49(H) 48(H) 41(H)  Creatinine 0.44 - 1.00 mg/dL 3.28(H) 3.33(H) 2.51(H)  Sodium 135 - 145 mmol/L 138 136 137  Potassium 3.5 - 5.1 mmol/L 4.2 4.3 3.6  Chloride 101 - 111 mmol/L 106 108 101  CO2 22 - 32 mmol/L 24 21(L) 25  Calcium 8.9 - 10.3 mg/dL 7.9(L) 7.9(L) 8.2(L)   CBC Latest Ref Rng 09/26/2014 09/25/2014 09/24/2014  WBC 4.0 - 10.5 K/uL 7.8 9.9  13.8(H)  Hemoglobin 12.0 - 15.0 g/dL 9.2(L) 9.1(L) 9.1(L)  Hematocrit 36.0 - 46.0 % 30.3(L) 30.1(L) 29.9(L)  Platelets 150 - 400 K/uL 152 128(L) 142(L)     Dg Chest Port 1 View  09/25/2014   CLINICAL DATA:  Nausea, vomiting, shortness of Breath  EXAM: PORTABLE CHEST - 1 VIEW  COMPARISON:  09/22/2014  FINDINGS: Right PleurX catheter in place. There are small bilateral pleural effusions, right slightly greater than left. Left effusion is new since prior study. Bibasilar atelectasis or infiltrates, right greater than left.  This is increased since prior study. Heart is likely upper limits normal in size.  IMPRESSION: Small bilateral pleural effusions with bibasilar atelectasis or infiltrates, right greater than left. Right PleurX catheter again noted.   Electronically Signed   By: Rolm Baptise M.D.   On: 09/25/2014 10:30    Review of Systems  Constitutional: Positive for malaise/fatigue. Negative for fever and chills.  HENT: Negative.   Eyes: Negative.   Respiratory: Negative.   Cardiovascular: Positive for chest pain. Negative for palpitations, orthopnea and claudication.       Intermittent right-sided pain over Pleurx catheter  Gastrointestinal:       See history of present illness  Genitourinary: Negative.   Musculoskeletal: Negative.   Skin: Negative.   Neurological: Positive for weakness. Negative for speech change, focal weakness and seizures.  Psychiatric/Behavioral: The patient is nervous/anxious.    Blood pressure 127/44, pulse 65, temperature 98.1 F (36.7 C), temperature source Oral, resp. rate 19, height 5\' 3"  (1.6 m), weight 84.1 kg (185 lb 6.5 oz), SpO2 100 %. Physical Exam  Nursing note and vitals reviewed. Constitutional: She is oriented to person, place, and time. She appears well-developed. No distress.  HENT:  Head: Normocephalic and atraumatic.  Nose: Nose normal.  Eyes: Conjunctivae and EOM are normal. Pupils are equal, round, and reactive to light. No scleral  icterus.  Neck: Normal range of motion. Neck supple. No JVD present.  Cardiovascular: Normal rate.   No murmur heard. Irregularly irregular  Respiratory: Effort normal and breath sounds normal. She has no wheezes. She has no rales.  GI: Soft. Bowel sounds are normal. There is no tenderness. There is no rebound and no guarding.  Musculoskeletal: Normal range of motion. She exhibits edema.  Trace lower extremity edema  Neurological: She is alert and oriented to person, place, and time.  Skin: Skin is warm and dry.  Psychiatric: She has a normal mood and affect.    Assessment/Plan: 1. Acute renal failure on chronic kidney disease stage IV: This appears to be hemodynamically mediated with recent gastrointestinal losses in the face of ongoing insensible losses/diuretic therapy. She is net fluid negative and I think based on her clinical exam, would tolerate 500 mL normal saline over the next 10 hours or so. Does not have any other indications of acute renal injury-before renal ultrasound at this time. 2. Nausea, vomiting, abdominal pain: Appears to be associated with Clostridium difficile colitis-improving on treatment with oral vancomycin. 3. Anemia: Scheduled to get intravenous iron-will have this ordered for today as well as give her a dose of Aranesp. 4. Metabolic bone disease: Corrected calcium level is about 9, phosphorus levels are acceptable-recheck today 5. Deconditioning/malnutrition: unfortunately progressive deconditioning with recurrent hospitalizations and multiple medical problems-may preclude her from being a candidate for dialysis 6. Clostridium difficile colitis: Improving with oral vancomycin  Geran Haithcock K. 09/26/2014, 11:40 AM

## 2014-09-26 NOTE — Care Management (Signed)
Important Message  Patient Details  Name: [name] MRN: [id number] Date of Birth: 1925/03/30   Medicare Important Message Given:   09-26-14    Marilu Favre, RN 09/26/2014, 2:10 PM

## 2014-09-26 NOTE — Progress Notes (Signed)
[name] is lying in bed and 2 daughters at bedside. They would like to give their other siblings opportunity to be a part of our conversation. We agree to meet 7/6 1400. Thank you for this consult.   No charge  Vinie Sill, NP Palliative Medicine Team Pager # [phone] (M-F 8a-5p) Team Phone # [phone] (Nights/Weekends)

## 2014-09-26 NOTE — Progress Notes (Signed)
PT Cancellation Note  Patient Details Name: [name] MRN: [id number] DOB: Sep 10, 1925   Cancelled Treatment:    Reason Eval/Treat Not Completed: Fatigue/lethargy limiting ability to participate; patient just back in bed about 15 minutes after up walking in room, to bathroom, sitting up on couch.  Family in room to confirm.  Will cancel for today due to fatigue.  Patient reports no concerns about potential d/c home tomorrow.  Encouraged slow progression up to normal mobility due to hospitalization.  Family feels HHPT follow up not needed at this time.   [name],CYNDI 09/26/2014, 2:23 PM Magda Kiel, [place] 09/26/2014

## 2014-09-26 NOTE — Progress Notes (Signed)
PROGRESS NOTE    [name] MVE:[id number] DOB: 08-13-1925 DOA: 09/22/2014 PCP: Abigail Miyamoto, MD  Cardiologist: Dr. Lucina Mellow  HPI/Brief narrative 81 year old female with history of stage D severe symptomatic aortic stenosis, stage IV CKD with nonfunctioning AV fistula, chronic right pleural effusion with an indwelling drainage catheter, IDDM/type II DM, chronic A. fib not an anticoagulation candidate because of fall risk, debilitation/weakness, chronic diastolic CHF, remote colon cancer history status post colostomy, cardiologist extensively discussed on 09/19/14 with patient and family regarding options for her aortic stenosis and patient is considering TAVR despite being made aware of all risks-cardiologist consulting with another cardiologist, nephrologist and TCTS prior to follow-up. She was admitted on 09/22/14 with sudden onset of nausea, nonbloody emesis, abdominal pain and increased colostomy output. No hematemesis or bleeding reported. In the ED, creatinine 2.7, lipase normal, hemoglobin 10.4, normal WBC, chest x-ray showed some improvement in right lung radiation since the prior study subsequent to placement of right-sided chest tube and no new abnormalities, CT abdomen and pelvis without contrast showed persistent right hydropneumothorax, LLQ colostomy with large parastomal hernia containing stomach, small bowel and colon but no obstruction or incarceration and no acute findings. She was admitted for possible acute GE.   Assessment/Plan:  C. difficile diarrhea/colitis - CT abdomen without contrast showed no acute findings. - Treated supportively with bowel rest, gentle IV fluids and antiemetics. - Started oral vancomycin 7/2 and complete 14 days' treatment. - Started probiotics. Minimize unnecessary antibiotic use. - Nausea, vomiting and abdominal pain resolved - Continue current management and if does not improve, consider GI consultation - Diarrhea seems to be  improving.  Dehydration -Hydrated with IV fluids. Improved. Developed dyspnea overnight 7/3,?? Pulmonary edema-chest x-ray however does not suggest so. DC IV fluids. - Resolved  Hypotension - Overnight 09/23/14 had blood pressure of 79/58. Resolved after IV fluids.  Acute on Stage IV chronic kidney disease - Baseline creatinine 2.4 - Creatinine has increased on 7/4 to 3.3 from 2.5. Possibly related to dehydration earlier/? ATN. DC the IV fluids secondary to concern for pulmonary edema. Trial of a dose of IV Lasix on 7/4.  - Creatinine remained at 3.2. Nephrology consulted: Acute renal failure appears to be hemodynamically mediated-trial of additional IV fluids.  Type II DM/IDDM - Held oral hypoglycemics. Continue SSI. - Controlled.  Chronic diastolic CHF - Compensated  Severe symptomatic aortic stenosis - Cardiology is coordinating care with multiple specialists. Patient and family considering TAVR  Chronic/recurrent right pleural effusion, s/p Pleurx catheter placement - Continue to drain Pleurx catheter every 2 days and outpatient follow-up with thoracic surgery. Improved clinically and radiologically. - As per nursing, Pleurx catheter was drained on 7/2 for 350 mL fluid and on 7/4 for 450 mL.   Chronic A. Fib - Rate controlled. Not anticoagulation candidate.  Anemia - Hemoglobin stable over last 72 hours. IV iron per nephrology and a dose of Aranesp.  Leukocytosis -Most likely secondary to C. difficile. Resolved  Mild thrombocytopenia - Probably due to acute infection. Resolved  Failure to thrive - Due to advanced age, frail health and multiple severe significant medical problems. Discussed at length with patient's 2 daughters at bedside on 7/5 along with patient. They are agreeable to palliative care consultation-meeting scheduled for 7/6.   DVT prophylaxis: Subcutaneous heparin Code Status: DO NOT RESUSCITATE Family Communication: Discussed with daughters [name] & [name] on 09/26/14 Disposition Plan: DC home when medically stable   Consultants:  Nephrology  Palliative care medicine  Procedures:  Has right upper quadrant Pleurx catheter PTA  Left-sided colostomy PTA  Antibiotics:  Oral vancomycin 7/2 >  Subjective: Feels much better. Asking to go home. No nausea, vomiting or abdominal pain. Dyspnea resolved. As per nursing and patient, colostomy output has decreased.   Objective: Filed Vitals:   09/25/14 2140 09/26/14 0608 09/26/14 0800 09/26/14 1507  BP: 130/48 127/44  132/50  Pulse: 67 65  68  Temp: 98.3 F (36.8 C) 98.1 F (36.7 C)  98.2 F (36.8 C)  TempSrc: Oral Oral  Oral  Resp: 18 19  18   Height:   5\' 3"  (1.6 m)   Weight:      SpO2: 100% 100%  100%    Intake/Output Summary (Last 24 hours) at 09/26/14 1754 Last data filed at 09/26/14 1750  Gross per 24 hour  Intake    957 ml  Output    575 ml  Net    382 ml   Filed Weights   09/24/14 0546 09/25/14 0545  Weight: 83.19 kg (183 lb 6.4 oz) 84.1 kg (185 lb 6.5 oz)     Exam:  General exam: Pleasant elderly female sitting up comfortably in chair. Respiratory system: Diminished breath sounds bilaterally, right >left with scattered coarse Velcro-like right-sided chronic sounding crackles.  No increased work of breathing. Improved breath sounds bilaterally. Cardiovascular system: S1 & S2 heard, RRR. No JVD, murmurs, gallops, clicks or pedal edema. Gastrointestinal system: Abdomen is nondistended, soft and nontender. Normal bowel sounds heard. Colostomy bag +. Central nervous system: Alert and oriented. No focal neurological deficits. Extremities: Symmetric 5 x 5 power.   Data Reviewed: Basic Metabolic Panel:  Recent Labs Lab 09/22/14 1137 09/22/14 2232 09/23/14 0325 09/25/14 0350 09/26/14 0348  NA 140  --  137 136 138  K 3.8  --  3.6 4.3 4.2  CL 104  --  101 108 106  CO2 26  --  25 21* 24  GLUCOSE 182*  --  149* 91 128*  BUN 44*  --  41*  48* 49*  CREATININE 2.70*  --  2.51* 3.33* 3.28*  CALCIUM 8.6*  --  8.2* 7.9* 7.9*  MG  --  1.9  --   --   --    Liver Function Tests:  Recent Labs Lab 09/22/14 1137 09/23/14 0325  AST 14* 14*  ALT 6* 7*  ALKPHOS 106 103  BILITOT 0.5 0.5  PROT 5.6* 5.6*  ALBUMIN 2.6* 2.5*    Recent Labs Lab 09/22/14 1137  LIPASE 28   No results for input(s): AMMONIA in the last 168 hours. CBC:  Recent Labs Lab 09/22/14 1137 09/23/14 0325 09/24/14 0410 09/25/14 0350 09/26/14 0348  WBC 10.8* 20.6* 13.8* 9.9 7.8  NEUTROABS 9.4*  --   --   --   --   HGB 10.4* 10.8* 9.1* 9.1* 9.2*  HCT 34.2* 35.4* 29.9* 30.1* 30.3*  MCV 93.4 92.9 92.3 95.0 92.7  PLT 148* 181 142* 128* 152   Cardiac Enzymes: No results for input(s): CKTOTAL, CKMB, CKMBINDEX, TROPONINI in the last 168 hours. BNP (last 3 results)  Recent Labs  09/08/14 1517  PROBNP 312.0*   CBG:  Recent Labs Lab 09/25/14 1630 09/25/14 2139 09/26/14 0732 09/26/14 1154 09/26/14 1655  GLUCAP 129* 173* 117* 130* 134*    Recent Results (from the past 240 hour(s))  Clostridium Difficile by PCR (not at [facility])     Status: Abnormal   Collection Time: 09/23/14  2:00 PM  Result Value Ref  Range Status   C difficile by pcr POSITIVE (A) NEGATIVE Final    Comment: CRITICAL RESULT CALLED TO, READ BACK BY AND VERIFIED WITH: RN CAIN,Z AT 1610 [id number] MARTINB         Studies: Dg Chest Port 1 View  09/25/2014   CLINICAL DATA:  Nausea, vomiting, shortness of Breath  EXAM: PORTABLE CHEST - 1 VIEW  COMPARISON:  09/22/2014  FINDINGS: Right PleurX catheter in place. There are small bilateral pleural effusions, right slightly greater than left. Left effusion is new since prior study. Bibasilar atelectasis or infiltrates, right greater than left. This is increased since prior study. Heart is likely upper limits normal in size.  IMPRESSION: Small bilateral pleural effusions with bibasilar atelectasis or infiltrates, right greater than left.  Right PleurX catheter again noted.   Electronically Signed   By: Rolm Baptise M.D.   On: 09/25/2014 10:30        Scheduled Meds: . aspirin EC  81 mg Oral QHS  . calcitRIOL  0.25 mcg Oral Daily  . carvedilol  25 mg Oral BID WC  . darbepoetin (ARANESP) injection - NON-DIALYSIS  40 mcg Subcutaneous Q Tue-1800  . ferrous sulfate  325 mg Oral BID WC  . heparin  5,000 Units Subcutaneous 3 times per day  . insulin aspart  0-9 Units Subcutaneous TID WC  . LORazepam  0.5 mg Oral BID  . mirtazapine  7.5 mg Oral QHS  . ondansetron  4 mg Intravenous 3 times per day  . saccharomyces boulardii  250 mg Oral BID  . simvastatin  20 mg Oral q1800  . vancomycin  125 mg Oral QID  . vitamin B-12  1,000 mcg Oral Daily   Continuous Infusions: . sodium chloride 50 mL/hr at 09/26/14 1229    Principal Problem:   Nausea, vomiting and diarrhea Active Problems:   Anemia   Diabetes mellitus, type II   Hypertension   Chronic atrial fibrillation   Chronic diastolic CHF (congestive heart failure)   CKD (chronic kidney disease), stage IV   Severe aortic stenosis   Type 2 diabetes mellitus with diabetic chronic kidney disease   Pleural effusion, right   DNR (do not resuscitate)   Flank pain   Dehydration   Abdominal pain   Leukocytosis    Time spent: 71 minutes    HONGALGI,ANAND, MD, FACP, FHM. [facility] Pager [phone]  If 7PM-7AM, please contact night-coverage [email or web address] Password [email or web address] 09/26/2014, 5:54 PM    LOS: 4 days

## 2014-09-26 NOTE — Care Management (Signed)
Important Message  Patient Details  Name: [name] MRN: [id number] Date of Birth: 12-May-1925   Medicare Important Message Given:       Loann Quill 09/26/2014, 2:41 PM

## 2014-09-27 ENCOUNTER — Inpatient Hospital Stay (HOSPITAL_COMMUNITY): Payer: Medicare Other

## 2014-09-27 DIAGNOSIS — I350 Nonrheumatic aortic (valve) stenosis: Secondary | ICD-10-CM

## 2014-09-27 DIAGNOSIS — R0602 Shortness of breath: Secondary | ICD-10-CM | POA: Insufficient documentation

## 2014-09-27 DIAGNOSIS — A047 Enterocolitis due to Clostridium difficile: Principal | ICD-10-CM

## 2014-09-27 DIAGNOSIS — Z515 Encounter for palliative care: Secondary | ICD-10-CM

## 2014-09-27 DIAGNOSIS — J948 Other specified pleural conditions: Secondary | ICD-10-CM

## 2014-09-27 DIAGNOSIS — I5032 Chronic diastolic (congestive) heart failure: Secondary | ICD-10-CM

## 2014-09-27 DIAGNOSIS — I482 Chronic atrial fibrillation: Secondary | ICD-10-CM

## 2014-09-27 DIAGNOSIS — A0472 Enterocolitis due to Clostridium difficile, not specified as recurrent: Secondary | ICD-10-CM | POA: Insufficient documentation

## 2014-09-27 LAB — RENAL FUNCTION PANEL
ANION GAP: 6 (ref 5–15)
Albumin: 2.1 g/dL — ABNORMAL LOW (ref 3.5–5.0)
BUN: 44 mg/dL — AB (ref 6–20)
CHLORIDE: 109 mmol/L (ref 101–111)
CO2: 24 mmol/L (ref 22–32)
CREATININE: 3.23 mg/dL — AB (ref 0.44–1.00)
Calcium: 8.4 mg/dL — ABNORMAL LOW (ref 8.9–10.3)
GFR calc Af Amer: 14 mL/min — ABNORMAL LOW (ref >60)
GFR calc non Af Amer: 12 mL/min — ABNORMAL LOW (ref >60)
GLUCOSE: 133 mg/dL — AB (ref 65–99)
POTASSIUM: 5 mmol/L (ref 3.5–5.1)
Phosphorus: 4.2 mg/dL (ref 2.5–4.6)
Sodium: 139 mmol/L (ref 135–145)

## 2014-09-27 LAB — GLUCOSE, CAPILLARY
GLUCOSE-CAPILLARY: 129 mg/dL — AB (ref 65–99)
GLUCOSE-CAPILLARY: 123 mg/dL — AB (ref 65–99)
Glucose-Capillary: 109 mg/dL — ABNORMAL HIGH (ref 65–99)
Glucose-Capillary: 150 mg/dL — ABNORMAL HIGH (ref 65–99)

## 2014-09-27 LAB — URINALYSIS, ROUTINE W REFLEX MICROSCOPIC
BILIRUBIN URINE: NEGATIVE
GLUCOSE, UA: NEGATIVE mg/dL
KETONES UR: NEGATIVE mg/dL
Nitrite: POSITIVE — AB
PH: 5 (ref 5.0–8.0)
PROTEIN: NEGATIVE mg/dL
Specific Gravity, Urine: 1.006 (ref 1.005–1.030)
Urobilinogen, UA: 0.2 mg/dL (ref 0.0–1.0)

## 2014-09-27 LAB — URINE MICROSCOPIC-ADD ON

## 2014-09-27 MED ORDER — FUROSEMIDE 80 MG PO TABS
80.0000 mg | ORAL_TABLET | Freq: Two times a day (BID) | ORAL | Status: DC
  Administered 2014-09-27 – 2014-09-29 (×5): 80 mg via ORAL
  Filled 2014-09-27 (×5): qty 1

## 2014-09-27 NOTE — Progress Notes (Signed)
PROGRESS NOTE    [name] HDQ:[id number] DOB: 1925-06-10 DOA: 09/22/2014 PCP: Natasha Miyamoto, MD  Cardiologist: Dr. Lucina [name]  HPI/Brief narrative 81 year old female with history of stage D severe symptomatic aortic stenosis, stage IV CKD with nonfunctioning AV fistula, chronic right pleural effusion with an indwelling drainage catheter, IDDM/type II DM, chronic A. fib not an anticoagulation candidate because of fall risk, debilitation/weakness, chronic diastolic CHF, remote colon cancer history status post colostomy, cardiologist extensively discussed on 09/19/14 with patient and family regarding options for her aortic stenosis and patient is considering TAVR despite being made aware of all risks-cardiologist consulting with another cardiologist, nephrologist and TCTS prior to follow-up. She was admitted on 09/22/14 with sudden onset of nausea, nonbloody emesis, abdominal pain and increased colostomy output. No hematemesis or bleeding reported. In the ED, creatinine 2.7, lipase normal, hemoglobin 10.4, normal WBC, chest x-ray showed some improvement in right lung radiation since the prior study subsequent to placement of right-sided chest tube and no new abnormalities, CT abdomen and pelvis without contrast showed persistent right hydropneumothorax, LLQ colostomy with large parastomal hernia containing stomach, small bowel and colon but no obstruction or incarceration and no acute findings. She was admitted for possible acute GE.   Assessment/Plan:  C. difficile diarrhea/colitis - CT abdomen without contrast showed no acute findings. - Treated supportively with bowel rest, gentle IV fluids and antiemetics. - Started oral vancomycin 7/2 and complete 14 days' treatment. - Started probiotics. Minimize unnecessary antibiotic use. - Nausea, vomiting and abdominal pain resolved - Continue current management and if does not improve, consider GI consultation - Diarrhea seems to be improving.  No colostomy output since this am on 7/6.  Hypotension - Overnight 09/23/14 had blood pressure of 79/58. Resolved after IV fluids.  Acute on Stage IV chronic kidney disease - Baseline creatinine 2.4 - Creatinine has increased on 7/4 to 3.3 from 2.5. Possibly related to dehydration earlier/? ATN. DC the IV fluids secondary to concern for pulmonary edema. Trial of a dose of IV Lasix on 7/4.  - Creatinine remained at 3.2. Nephrology consulted: Acute renal failure appears to be hemodynamically mediated-trial of additional IV fluids. -7/6 not able to tolerate additional iv fluids due to c/o sob, patient states she agrees with dialysis if needed.   Chronic diastolic CHF - c/o sob, has been requiring oxygen supplement since in the hospital, c/o sob, not able to lay flat, denies chest pain, not able to tolerate fluids,  -lasix 80mg  po bid restarted on 7/6 by nephrology  Chronic/recurrent right pleural effusion, s/p Pleurx catheter placement - Continue to drain Pleurx catheter every 2 days and outpatient follow-up with thoracic surgery.  - As per nursing, Pleurx catheter was drained on 7/2 for 350 mL fluid, on 7/4 for 450 mL and drained on 7/6 -difficulty drain on 7/6, repeat cxr, consult vascular surgery if pleurx malfunction.  Severe symptomatic aortic stenosis - Cardiology is coordinating care with multiple specialists. Patient and family considering TAVR   Chronic A. Fib - Rate controlled. Not anticoagulation candidate.  Type II DM/IDDM - Held oral hypoglycemics. Continue SSI. - Controlled.  Anemia - Hemoglobin stable over last 72 hours. IV iron per nephrology and a dose of Aranesp.  Leukocytosis -Most likely secondary to C. difficile. Resolved  Mild thrombocytopenia - Probably due to acute infection. Resolved  Failure to thrive - Due to advanced age, frail health and multiple severe significant medical problems. Discussed at length with patient's 2 daughters at bedside on 7/5  along with patient. They are agreeable to palliative care consultation-meeting scheduled for 7/6. -as of 7/6, patient and family want to have dialysis/TAVR   DVT prophylaxis: Subcutaneous heparin Code Status: DO NOT RESUSCITATE Family Communication: Discussed with daughters [name] & [name] on 09/26/14, multiple family members in room on 7/6. Disposition Plan: TBD   Consultants:  Nephrology  Palliative care medicine  Procedures:  Has right upper quadrant Pleurx catheter PTA  Left-sided colostomy PTA  Antibiotics:  Oral vancomycin 7/2 >  Subjective: C/o sob, on oxygen supplement, want to go home. Multiple family members in room.  No nausea, vomiting or abdominal pain. As per nursing and patient, colostomy output has decreased.   Objective: Filed Vitals:   09/26/14 1507 09/26/14 2207 09/27/14 0531 09/27/14 1427  BP: 132/50 169/69 137/51 137/69  Pulse: 68 76 67 68  Temp: 98.2 F (36.8 C) 98.6 F (37 C) 97.6 F (36.4 C) 98.1 F (36.7 C)  TempSrc: Oral Oral Oral Oral  Resp: 18 21 18 18   Height:      Weight:   82.736 kg (182 lb 6.4 oz)   SpO2: 100% 97% 97% 97%    Intake/Output Summary (Last 24 hours) at 09/27/14 1702 Last data filed at 09/27/14 1340  Gross per 24 hour  Intake    837 ml  Output    450 ml  Net    387 ml   Filed Weights   09/24/14 0546 09/25/14 0545 09/27/14 0531  Weight: 83.19 kg (183 lb 6.4 oz) 84.1 kg (185 lb 6.5 oz) 82.736 kg (182 lb 6.4 oz)     Exam:  General exam: frail elderly female sitting up in chair, NAD Respiratory system: Diminished breath sounds bilaterally, right >left with scattered coarse Velcro-like right-sided chronic sounding crackles.  No increased work of breathing. Improved breath sounds bilaterally. pleurex catheter in place Cardiovascular system: S1 & S2 heard, RRR. No JVD, murmurs, gallops, clicks or pedal edema. Gastrointestinal system: Abdomen is nondistended, soft and nontender. Normal bowel sounds  heard. Colostomy bag +. Central nervous system: Alert and oriented. No focal neurological deficits. Extremities: Symmetric 5 x 5 power. Pitting edema bilateral lower extremity   Data Reviewed: Basic Metabolic Panel:  Recent Labs Lab 09/22/14 1137 09/22/14 2232 09/23/14 0325 09/25/14 0350 09/26/14 0348 09/27/14 1133  NA 140  --  137 136 138 139  K 3.8  --  3.6 4.3 4.2 5.0  CL 104  --  101 108 106 109  CO2 26  --  25 21* 24 24  GLUCOSE 182*  --  149* 91 128* 133*  BUN 44*  --  41* 48* 49* 44*  CREATININE 2.70*  --  2.51* 3.33* 3.28* 3.23*  CALCIUM 8.6*  --  8.2* 7.9* 7.9* 8.4*  MG  --  1.9  --   --   --   --   PHOS  --   --   --   --   --  4.2   Liver Function Tests:  Recent Labs Lab 09/22/14 1137 09/23/14 0325 09/27/14 1133  AST 14* 14*  --   ALT 6* 7*  --   ALKPHOS 106 103  --   BILITOT 0.5 0.5  --   PROT 5.6* 5.6*  --   ALBUMIN 2.6* 2.5* 2.1*    Recent Labs Lab 09/22/14 1137  LIPASE 28   No results for input(s): AMMONIA in the last 168 hours. CBC:  Recent Labs Lab 09/22/14 1137 09/23/14 0325 09/24/14 0410 09/25/14  0350 09/26/14 0348  WBC 10.8* 20.6* 13.8* 9.9 7.8  NEUTROABS 9.4*  --   --   --   --   HGB 10.4* 10.8* 9.1* 9.1* 9.2*  HCT 34.2* 35.4* 29.9* 30.1* 30.3*  MCV 93.4 92.9 92.3 95.0 92.7  PLT 148* 181 142* 128* 152   Cardiac Enzymes: No results for input(s): CKTOTAL, CKMB, CKMBINDEX, TROPONINI in the last 168 hours. BNP (last 3 results)  Recent Labs  09/08/14 1517  PROBNP 312.0*   CBG:  Recent Labs Lab 09/26/14 1154 09/26/14 1655 09/26/14 2212 09/27/14 0721 09/27/14 1207  GLUCAP 130* 134* 149* 109* 150*    Recent Results (from the past 240 hour(s))  Clostridium Difficile by PCR (not at [facility])     Status: Abnormal   Collection Time: 09/23/14  2:00 PM  Result Value Ref Range Status   C difficile by pcr POSITIVE (A) NEGATIVE Final    Comment: CRITICAL RESULT CALLED TO, READ BACK BY AND VERIFIED WITH: RN CAIN,Z AT 1745  [id number] MARTINB         Studies: No results found.      Scheduled Meds: . aspirin EC  81 mg Oral QHS  . calcitRIOL  0.25 mcg Oral Daily  . carvedilol  25 mg Oral BID WC  . darbepoetin (ARANESP) injection - NON-DIALYSIS  40 mcg Subcutaneous Q Tue-1800  . ferrous sulfate  325 mg Oral BID WC  . furosemide  80 mg Oral BID  . heparin  5,000 Units Subcutaneous 3 times per day  . insulin aspart  0-9 Units Subcutaneous TID WC  . LORazepam  0.5 mg Oral BID  . mirtazapine  7.5 mg Oral QHS  . ondansetron  4 mg Intravenous 3 times per day  . saccharomyces boulardii  250 mg Oral BID  . simvastatin  20 mg Oral q1800  . vancomycin  125 mg Oral QID  . vitamin B-12  1,000 mcg Oral Daily   Continuous Infusions:    Principal Problem:   Nausea, vomiting and diarrhea Active Problems:   Anemia   Diabetes mellitus, type II   Hypertension   Chronic atrial fibrillation   Chronic diastolic CHF (congestive heart failure)   CKD (chronic kidney disease), stage IV   Severe aortic stenosis   Type 2 diabetes mellitus with diabetic chronic kidney disease   Pleural effusion, right   DNR (do not resuscitate)   Flank pain   Dehydration   Abdominal pain   Leukocytosis   Palliative care encounter    Time spent: 19 minutes    Marwan Lipe, MD, PhD [facility] Pager [phone]  If 7PM-7AM, please contact night-coverage [email or web address] Password [email or web address] 09/27/2014, 5:02 PM    LOS: 5 days

## 2014-09-27 NOTE — Progress Notes (Signed)
Family worried that drain was slow, and not having a lot of output. Patient had chest x-ray. Called on-call with results. She said there was no acute problems at this time, and they can look into it more in the morning.

## 2014-09-27 NOTE — Consult Note (Signed)
Consultation Note Date: 09/27/2014   Patient Name: [name]  DOB: Jul 02, 1925  MRN: [id number]  Age / Sex: 81 y.o., female   PCP: Briscoe Deutscher, MD Referring Physician: Florencia Reasons, MD  Reason for Consultation: Establishing goals of care  Palliative Care Assessment and Plan Summary of Established Goals of Care and Medical Treatment Preferences    Palliative Care Discussion Held Today:   I met today with Ms. [name] and numerous other family members (daughters and daughter-in-law). Some of the family members are upset that Ms. [name] has already made a decision and wants to have TAVR. I tried to explain that her doctors are trying to help her and make sure that we will be doing things that we believe will be beneficial and that the benefit outweighs the risks/harm. Other family members understand better that there is no guarantee even after going through all of this that it will make her feel better.   I backed them up to a more pressing matter of her renal failure. One of her daughters was happy to know that Dr. Posey Pronto has been working with her here as this is her doctor. When discussing her renal failure family was very leading in saying that this would help her feel better. I made sure to tell [name] that this is not necessarily the case especially when there are other health concerns. I encouraged her to consider if her body was strong enough to go through dialysis and if this is how she wanted to spend whatever time she may have left. At this point [name] tells me that she would be open to considering dialysis.   Unfortunately family was very leading in her decisions and I did not hear much from [name] except that she wants "whatever will help me feel better." I am not sure she understands the risks involved and that she may feel worse with interventions. However, she does tell me:  - Wants TAVR even if there is a slim chance it will help her feel better "and if I die on the table that's  okay too." - Would consider dialysis - encouraged her to consider this further - Main goal is to get back to her home. Would be open to short term rehab but plans to return home and family supportive of helping her with this goal.   Also complaining about burning with urination and concerned about UTI - I have informed Dr. Erlinda Hong.   Contacts/Participants in Discussion: Primary Decision Maker: She makes own decisions. Numerous children but daughters [name] and Judson Roch seem to be her main support.   Goals of Care/Code Status/Advance Care Planning:   Code Status: DNR - already in place, did not discuss today   Symptom Management:   Dyspnea: Being managed by primary team and nephrology.   Psycho-social/Spiritual:   Support System: Good  Prognosis: Very poor - difficult to determine at this time  Discharge Planning:  To be determined       Chief Complaint/HPI: 81 year old female with history of stage D severe symptomatic aortic stenosis, stage IV CKD with nonfunctioning AV fistula, chronic right pleural effusion with an indwelling drainage catheter, IDDM/type II DM, chronic A. fib not an anticoagulation candidate because of fall risk, debilitation/weakness, chronic diastolic CHF, remote colon cancer history status post colostomy, cardiologist extensively discussed on 09/19/14 with patient and family regarding options for her aortic stenosis and patient is considering TAVR despite being made aware of all risks-cardiologist consulting with another cardiologist, nephrologist and  TCTS prior to follow-up. She was admitted on 09/22/14 with sudden onset of nausea, nonbloody emesis, abdominal pain and increased colostomy output. Treating for C. diff infection but now with worsening renal function.   Primary Diagnoses  Present on Admission:  . Flank pain . Nausea, vomiting and diarrhea . Dehydration . Abdominal pain . Leukocytosis . Hypertension . Anemia . Chronic atrial fibrillation . Chronic  diastolic CHF (congestive heart failure) . CKD (chronic kidney disease), stage IV . Type 2 diabetes mellitus with diabetic chronic kidney disease . Pleural effusion, right . DNR (do not resuscitate)  Palliative Review of Systems:   + burning with urination, + dyspnea (improved today)   I have reviewed the medical record, interviewed the patient and family, and examined the patient. The following aspects are pertinent.  Past Medical History  Diagnosis Date  . CKD (chronic kidney disease), stage IV     a. Baseline Cr reported 2.5-2.6.  Marland Kitchen Anxiety   . Diabetes mellitus     diabetic retinopathy  . Anemia   . Urinary tract infection   . Fall at home   . Colostomy care     colon ca - h/o bloody output from bag 12/2012 (ED visit)  . Colon cancer 30 year ago  . Chronic atrial fibrillation     a. not on anticoagulation due to increased risk of falls, advanced age.  . Chronic diastolic CHF (congestive heart failure)     a. Echo 10/2013: mild LVH, EF 60-65%, mod AS, mildly dilated LA, mod dilated RA, PASP 33.  Marland Kitchen Hypertension   . Carotid disease, bilateral     a. Carotid duplex 05/5699: 77-93% RICA/LICA.  Marland Kitchen H/O cardiovascular stress test     a. 2002: nuc negative for ischemia, EF 65%.  . Shortness of breath dyspnea   . Severe aortic stenosis 08/23/2014  . Heart murmur   . Depression     situational  . Headache   . Arthritis   . Recurrent pleural effusion on right    History   Social History  . Marital Status: Widowed    Spouse Name: N/A  . Number of Children: N/A  . Years of Education: N/A   Social History Main Topics  . Smoking status: Never Smoker   . Smokeless tobacco: Current User    Types: Chew  . Alcohol Use: No  . Drug Use: No  . Sexual Activity: No   Other Topics Concern  . None   Social History Narrative   Family History  Problem Relation Age of Onset  . Heart disease Father   . Diabetes Sister   . Diabetes Daughter   . Hyperlipidemia Daughter   .  Hypertension Daughter   . Other Daughter     varicose veins  . Cancer Son   . Diabetes Son   . Heart disease Son     before age 7  . Hyperlipidemia Son   . Hypertension Son   . Heart attack Father   . Stroke Father    Scheduled Meds: . aspirin EC  81 mg Oral QHS  . calcitRIOL  0.25 mcg Oral Daily  . carvedilol  25 mg Oral BID WC  . darbepoetin (ARANESP) injection - NON-DIALYSIS  40 mcg Subcutaneous Q Tue-1800  . ferrous sulfate  325 mg Oral BID WC  . heparin  5,000 Units Subcutaneous 3 times per day  . insulin aspart  0-9 Units Subcutaneous TID WC  . LORazepam  0.5 mg Oral BID  .  mirtazapine  7.5 mg Oral QHS  . ondansetron  4 mg Intravenous 3 times per day  . saccharomyces boulardii  250 mg Oral BID  . simvastatin  20 mg Oral q1800  . vancomycin  125 mg Oral QID  . vitamin B-12  1,000 mcg Oral Daily   Continuous Infusions:  PRN Meds:.acetaminophen **OR** acetaminophen, meclizine, prochlorperazine, traMADol Medications Prior to Admission:  Prior to Admission medications   Medication Sig Start Date End Date Taking? Authorizing Provider  acetaminophen (TYLENOL) 500 MG tablet Take 500 mg by mouth every 6 (six) hours as needed for mild pain.    Yes Historical Provider, MD  aspirin EC 81 MG tablet Take 81 mg by mouth at bedtime.    Yes Historical Provider, MD  calcitRIOL (ROCALTROL) 0.25 MCG capsule Take 0.25 mcg by mouth daily.  04/06/12  Yes Historical Provider, MD  carvedilol (COREG) 25 MG tablet Take 25 mg by mouth 2 (two) times daily. 09/15/14  Yes Historical Provider, MD  clotrimazole-betamethasone (LOTRISONE) lotion Apply 1 application topically daily as needed (apply to rash on stomach as needed).  05/25/14  Yes Historical Provider, MD  docusate sodium (COLACE) 100 MG capsule Take 100 mg by mouth daily as needed for constipation.    Yes Historical Provider, MD  epoetin alfa (EPOGEN,PROCRIT) 14970 UNIT/ML injection 20,000 Units. Every two weeks   Yes Historical Provider, MD    ferrous sulfate 325 (65 FE) MG tablet Take 1 tablet (325 mg total) by mouth 2 (two) times daily with a meal. 07/24/14  Yes Modena Jansky, MD  furosemide (LASIX) 80 MG tablet Take 1 tablet (80 mg total) by mouth 2 (two) times daily. 09/08/14  Yes [name], MD  insulin glargine (LANTUS) 100 UNIT/ML injection Inject 8 Units into the skin at bedtime as needed (if cbg over 200).    Yes Historical Provider, MD  linagliptin (TRADJENTA) 5 MG TABS tablet Take 5 mg by mouth daily.    Yes Historical Provider, MD  LORazepam (ATIVAN) 0.5 MG tablet Take 0.5 mg by mouth 2 (two) times daily.   Yes Historical Provider, MD  meclizine (ANTIVERT) 12.5 MG tablet Take 12.5 mg by mouth as needed for dizziness.  11/23/12  Yes Historical Provider, MD  mirtazapine (REMERON) 7.5 MG tablet Take 7.5 mg by mouth at bedtime.  09/28/12  Yes Historical Provider, MD  promethazine (PHENERGAN) 12.5 MG tablet Take 1 tablet (12.5 mg total) by mouth every 6 (six) hours as needed for nausea or vomiting. 03/14/14  Yes Ripudeep Krystal Eaton, MD  simvastatin (ZOCOR) 20 MG tablet TAKE 1 TABLET (20 MG TOTAL) BY MOUTH AT BEDTIME. 08/28/14  Yes [name], MD  traMADol (ULTRAM) 50 MG tablet Take 1 tablet (50 mg total) by mouth every 6 (six) hours as needed for moderate pain. 09/12/14  Yes Melrose Nakayama, MD  vitamin B-12 (CYANOCOBALAMIN) 1000 MCG tablet Take 1,000 mcg by mouth daily.   Yes Historical Provider, MD  HYDROcodone-acetaminophen (NORCO/VICODIN) 5-325 MG per tablet Take 1 tablet by mouth every 4 (four) hours as needed for moderate pain. 05/16/14   Orson Eva, MD   Allergies  Allergen Reactions  . Levaquin [Levofloxacin] Nausea Only  . Codeine Nausea Only  . Keflet [Cephalexin] Nausea Only  . Morphine And Related Nausea Only  . Oxycodone Other (See Comments)    Abnormal behavior   CBC:    Component Value Date/Time   WBC 7.8 09/26/2014 0348   HGB 9.2* 09/26/2014 0348   HCT  30.3* 09/26/2014 0348   PLT 152 09/26/2014 0348    MCV 92.7 09/26/2014 0348   NEUTROABS 9.4* 09/22/2014 1137   LYMPHSABS 1.0 09/22/2014 1137   MONOABS 0.4 09/22/2014 1137   EOSABS 0.0 09/22/2014 1137   BASOSABS 0.0 09/22/2014 1137   Comprehensive Metabolic Panel:    Component Value Date/Time   NA 138 09/26/2014 0348   K 4.2 09/26/2014 0348   CL 106 09/26/2014 0348   CO2 24 09/26/2014 0348   BUN 49* 09/26/2014 0348   CREATININE 3.28* 09/26/2014 0348   GLUCOSE 128* 09/26/2014 0348   CALCIUM 7.9* 09/26/2014 0348   CALCIUM 8.8 09/15/2014 1448   AST 14* 09/23/2014 0325   ALT 7* 09/23/2014 0325   ALKPHOS 103 09/23/2014 0325   BILITOT 0.5 09/23/2014 0325   PROT 5.6* 09/23/2014 0325   ALBUMIN 2.5* 09/23/2014 0325    Physical Exam:  Vital Signs: BP 137/51 mmHg  Pulse 67  Temp(Src) 97.6 F (36.4 C) (Oral)  Resp 18  Ht $R'5\' 3"'xe$  (1.6 m)  Wt 82.736 kg (182 lb 6.4 oz)  BMI 32.32 kg/m2  SpO2 97% SpO2: SpO2: 97 % O2 Device: O2 Device: Nasal Cannula O2 Flow Rate: O2 Flow Rate (L/min): 2 L/min Intake/output summary:  Intake/Output Summary (Last 24 hours) at 09/27/14 0759 Last data filed at 09/27/14 0500  Gross per 24 hour  Intake    837 ml  Output    325 ml  Net    512 ml   LBM: Last BM Date: 09/25/14 Baseline Weight: Weight: 83.19 kg (183 lb 6.4 oz) Most recent weight: Weight: 82.736 kg (182 lb 6.4 oz)  Exam Findings:   General: NAD, sitting up in recliner HEENT: [place]/AT CVS: RRR Resp: No labored breathing, nasal cannula in place Abd: Soft, ND, colostomy Neuro: Alert, oriented x 3           Palliative Performance Scale: 40-50 %                Additional Data Reviewed: Recent Labs     09/25/14  0350  09/26/14  0348  WBC  9.9  7.8  HGB  9.1*  9.2*  PLT  128*  152  NA  136  138  BUN  48*  49*  CREATININE  3.33*  3.28*     Time In: 1400 Time Out: 1515 Time Total: 7min  Greater than 50%  of this time was spent counseling and coordinating care related to the above assessment and plan.   Signed  by:  Vinie Sill, NP Palliative Medicine Team Pager # [phone] (M-F 8a-5p) Team Phone # [phone] (Nights/Weekends)

## 2014-09-27 NOTE — Progress Notes (Signed)
Patient ID: [name], female   DOB: 1925/05/27, 81 y.o.   MRN: [id number]  [facility] Progress Note    Assessment/ Plan:   1. Acute renal failure on chronic kidney disease stage IV: This appears to be hemodynamically mediated with recent gastrointestinal losses in the face of ongoing insensible losses/diuretic therapy. Awaiting labs from this morning to assess renal function/electrolytes. Unfortunately, she did not tolerate gentle intravenous fluids and developed some shortness of breath overnight-restart furosemide at her usual outpatient dose. We had a longer talk today regarding renal replacement therapy and she feels that she may be at a point where she would not consider any forms of dialysis. 2. Anemia: Status post intravenous iron and Aranesp yesterday for continued treatment of anemia of chronic kidney disease.. 3. Metabolic bone disease: Corrected calcium level is about 9, phosphorus levels are acceptable-recheck today 4. Deconditioning/malnutrition: unfortunately progressive deconditioning with recurrent hospitalizations and multiple medical problems- she echoes the sentiment from prior discussions with other providers regarding not to do dialysis. 5. Clostridium difficile colitis: Improving with oral vancomycin-colostomy output is now back to her usual baseline  Subjective:   Had a rough night with shortness of breath    Objective:   BP 137/51 mmHg  Pulse 67  Temp(Src) 97.6 F (36.4 C) (Oral)  Resp 18  Ht 5\' 3"  (1.6 m)  Wt 82.736 kg (182 lb 6.4 oz)  BMI 32.32 kg/m2  SpO2 97%  Intake/Output Summary (Last 24 hours) at 09/27/14 1134 Last data filed at 09/27/14 0951  Gross per 24 hour  Intake    597 ml  Output    350 ml  Net    247 ml   Weight change:   Physical Exam: Gen: uncomfortably resting in recliner CVS: Pulse irregularly irregular, S1 and S2 normal Resp: Fine rales left base otherwise clear Abd: Soft, obese, nontender Ext: Trace lower  extremity edema  Imaging: No results found.  Labs: BMET  Recent Labs Lab 09/22/14 1137 09/23/14 0325 09/25/14 0350 09/26/14 0348  NA 140 137 136 138  K 3.8 3.6 4.3 4.2  CL 104 101 108 106  CO2 26 25 21* 24  GLUCOSE 182* 149* 91 128*  BUN 44* 41* 48* 49*  CREATININE 2.70* 2.51* 3.33* 3.28*  CALCIUM 8.6* 8.2* 7.9* 7.9*   CBC  Recent Labs Lab 09/22/14 1137 09/23/14 0325 09/24/14 0410 09/25/14 0350 09/26/14 0348  WBC 10.8* 20.6* 13.8* 9.9 7.8  NEUTROABS 9.4*  --   --   --   --   HGB 10.4* 10.8* 9.1* 9.1* 9.2*  HCT 34.2* 35.4* 29.9* 30.1* 30.3*  MCV 93.4 92.9 92.3 95.0 92.7  PLT 148* 181 142* 128* 152    Medications:    . aspirin EC  81 mg Oral QHS  . calcitRIOL  0.25 mcg Oral Daily  . carvedilol  25 mg Oral BID WC  . darbepoetin (ARANESP) injection - NON-DIALYSIS  40 mcg Subcutaneous Q Tue-1800  . ferrous sulfate  325 mg Oral BID WC  . heparin  5,000 Units Subcutaneous 3 times per day  . insulin aspart  0-9 Units Subcutaneous TID WC  . LORazepam  0.5 mg Oral BID  . mirtazapine  7.5 mg Oral QHS  . ondansetron  4 mg Intravenous 3 times per day  . saccharomyces boulardii  250 mg Oral BID  . simvastatin  20 mg Oral q1800  . vancomycin  125 mg Oral QID  . vitamin B-12  1,000 mcg Oral Daily  Elmarie Shiley, MD 09/27/2014, 11:34 AM

## 2014-09-27 NOTE — Progress Notes (Signed)
PT Cancellation Note  Patient Details Name: [name] MRN: [id number] DOB: 1925/07/26   Cancelled Treatment:    Reason Eval/Treat Not Completed: Fatigue/lethargy limiting ability to participate; patient just up to chair with nurse tech, pt reports "bad night" with little rest due to breathing issues.  Wishes to rest currently, will cancel for today and attempt tomorrow.   Ilze Roselli,CYNDI 09/27/2014, 10:31 AM  Magda Kiel, [place] 09/27/2014

## 2014-09-28 DIAGNOSIS — R197 Diarrhea, unspecified: Secondary | ICD-10-CM

## 2014-09-28 DIAGNOSIS — R112 Nausea with vomiting, unspecified: Secondary | ICD-10-CM

## 2014-09-28 LAB — GLUCOSE, CAPILLARY
GLUCOSE-CAPILLARY: 156 mg/dL — AB (ref 65–99)
Glucose-Capillary: 103 mg/dL — ABNORMAL HIGH (ref 65–99)
Glucose-Capillary: 135 mg/dL — ABNORMAL HIGH (ref 65–99)
Glucose-Capillary: 123 mg/dL — ABNORMAL HIGH (ref 65–99)

## 2014-09-28 LAB — RENAL FUNCTION PANEL
ALBUMIN: 2 g/dL — AB (ref 3.5–5.0)
Anion gap: 7 (ref 5–15)
BUN: 41 mg/dL — ABNORMAL HIGH (ref 6–20)
CO2: 24 mmol/L (ref 22–32)
CREATININE: 2.98 mg/dL — AB (ref 0.44–1.00)
Calcium: 8.2 mg/dL — ABNORMAL LOW (ref 8.9–10.3)
Chloride: 106 mmol/L (ref 101–111)
GFR, EST AFRICAN AMERICAN: 15 mL/min — AB (ref >60)
GFR, EST NON AFRICAN AMERICAN: 13 mL/min — AB (ref >60)
Glucose, Bld: 91 mg/dL (ref 65–99)
PHOSPHORUS: 4.1 mg/dL (ref 2.5–4.6)
Potassium: 4.1 mmol/L (ref 3.5–5.1)
SODIUM: 137 mmol/L (ref 135–145)

## 2014-09-28 NOTE — Progress Notes (Signed)
PROGRESS NOTE    [name] WVP:[id number] DOB: Oct 20, 1925 DOA: 09/22/2014 PCP: Abigail Miyamoto, MD  Cardiologist: Dr. Lucina Mellow  HPI/Brief narrative 81 year old female with history of stage D severe symptomatic aortic stenosis, stage IV CKD with nonfunctioning AV fistula, chronic right pleural effusion with an indwelling drainage catheter, IDDM/type II DM, chronic A. fib not an anticoagulation candidate because of fall risk, debilitation/weakness, chronic diastolic CHF, remote colon cancer history status post colostomy, cardiologist extensively discussed on 09/19/14 with patient and family regarding options for her aortic stenosis and patient is considering TAVR despite being made aware of all risks-cardiologist consulting with another cardiologist, nephrologist and TCTS prior to follow-up. She was admitted on 09/22/14 with sudden onset of nausea, nonbloody emesis, abdominal pain and increased colostomy output. No hematemesis or bleeding reported. In the ED, creatinine 2.7, lipase normal, hemoglobin 10.4, normal WBC, chest x-ray showed some improvement in right lung radiation since the prior study subsequent to placement of right-sided chest tube and no new abnormalities, CT abdomen and pelvis without contrast showed persistent right hydropneumothorax, LLQ colostomy with large parastomal hernia containing stomach, small bowel and colon but no obstruction or incarceration and no acute findings. She was admitted for possible acute GE.   Assessment/Plan:  C. difficile diarrhea/colitis - CT abdomen without contrast showed no acute findings. - Treated supportively with bowel rest, gentle IV fluids and antiemetics. - Started oral vancomycin 7/2 and complete 14 days' treatment. - Started probiotics. Minimize unnecessary antibiotic use. - Nausea, vomiting and abdominal pain resolved - Continue current management and if does not improve, consider GI consultation - Diarrhea seems stopped. since  7/6.  Hypotension - Overnight 09/23/14 had blood pressure of 79/58. Resolved after IV fluids.  Acute on Stage IV chronic kidney disease - Baseline creatinine 2.4 - Creatinine has increased on 7/4 to 3.3 from 2.5. Possibly related to dehydration earlier/? ATN. DC the IV fluids secondary to concern for pulmonary edema. Trial of a dose of IV Lasix on 7/4.  - Creatinine remained at 3.2. Nephrology consulted: Acute renal failure appears to be hemodynamically mediated-trial of additional IV fluids. -7/6 not able to tolerate additional iv fluids due to c/o sob, patient states she agrees with dialysis if needed. -renal function improving with diuresis. Nephrology signed off.   Chronic diastolic CHF - c/o sob, has been requiring oxygen supplement since in the hospital, c/o sob, not able to lay flat, denies chest pain, not able to tolerate fluids,  -lasix 80mg  po bid restarted on 7/6 by nephrology -improving, good urine output, less edema -wean off oxygen, outpatient cardiology follow up  Chronic/recurrent right pleural effusion, s/p Pleurx catheter placement - Continue to drain Pleurx catheter every 2 days and outpatient follow-up with thoracic surgery.  - As per nursing, Pleurx catheter was drained on 7/2 for 350 mL fluid, on 7/4 for 450 mL and drained on 7/6 -reported slow  drain on 7/6 with only 200cc out, repeat cxr persistent pleural effusion, outpatietn vascular surgery follow up, continue lasix.  Severe symptomatic aortic stenosis - Cardiology is coordinating care with multiple specialists. Patient and family considering TAVR   Chronic A. Fib - Rate controlled. Not anticoagulation candidate.  Type II DM/IDDM - Held oral hypoglycemics. Continue SSI. - Controlled.  Anemia - Hemoglobin stable over last 72 hours. IV iron per nephrology and a dose of Aranesp.  Leukocytosis -Most likely secondary to C. difficile. Resolved  Mild thrombocytopenia - Probably due to acute infection.  Resolved  Failure to thrive -  Due to advanced age, frail health and multiple severe significant medical problems. Discussed at length with patient's 2 daughters at bedside on 7/5 along with patient. They are agreeable to palliative care consultation-meeting scheduled for 7/6. -as of 7/6, patient and family want to have dialysis/TAVR -advised patient to communicate with nephrology about her decision regarding dialysis,patient states she will talk to Dr. Clover Mealy.  Dysuria: ua +bacteria, no fever, no leukocytosis, , will hold of abx for now due to cdiff. F/u on urine culture pending   DVT prophylaxis: Subcutaneous heparin Code Status: DO NOT RESUSCITATE Family Communication: Discussed with daughters [name] & [name] on 09/26/14, multiple family members in room on 7/6. Disposition Plan: TBD   Consultants:  Nephrology  Palliative care medicine  Procedures:  Has right upper quadrant Pleurx catheter PTA  Left-sided colostomy PTA  Antibiotics:  Oral vancomycin 7/2 >  Subjective: Off oxygen supplement, reported lasix helped, less edema Reported diarrhea stopped.   Objective: Filed Vitals:   09/27/14 0531 09/27/14 1427 09/27/14 2100 09/28/14 0452  BP: 137/51 137/69 161/57 138/42  Pulse: 67 68 69 65  Temp: 97.6 F (36.4 C) 98.1 F (36.7 C) 98.5 F (36.9 C) 97.7 F (36.5 C)  TempSrc: Oral Oral Oral Oral  Resp: 18 18 18 18   Height:      Weight: 82.736 kg (182 lb 6.4 oz)   82.192 kg (181 lb 3.2 oz)  SpO2: 97% 97% 99% 98%    Intake/Output Summary (Last 24 hours) at 09/28/14 1218 Last data filed at 09/28/14 0802  Gross per 24 hour  Intake    360 ml  Output   1275 ml  Net   -915 ml   Filed Weights   09/25/14 0545 09/27/14 0531 09/28/14 0452  Weight: 84.1 kg (185 lb 6.5 oz) 82.736 kg (182 lb 6.4 oz) 82.192 kg (181 lb 3.2 oz)     Exam:  General exam: frail elderly female sitting up in chair, NAD Respiratory system: Diminished breath sounds bilaterally,  right >left with scattered coarse Velcro-like right-sided chronic sounding crackles.  No increased work of breathing. Improved breath sounds bilaterally. pleurex catheter in place Cardiovascular system: S1 & S2 heard, RRR. No JVD, murmurs, gallops, clicks or pedal edema. Gastrointestinal system: Abdomen is nondistended, soft and nontender. Normal bowel sounds heard. Colostomy bag +. Central nervous system: Alert and oriented. No focal neurological deficits. Extremities: Symmetric 5 x 5 power. Less Pitting edema bilateral lower extremity   Data Reviewed: Basic Metabolic Panel:  Recent Labs Lab 09/22/14 2232 09/23/14 0325 09/25/14 0350 09/26/14 0348 09/27/14 1133 09/28/14 0503  NA  --  137 136 138 139 137  K  --  3.6 4.3 4.2 5.0 4.1  CL  --  101 108 106 109 106  CO2  --  25 21* 24 24 24   GLUCOSE  --  149* 91 128* 133* 91  BUN  --  41* 48* 49* 44* 41*  CREATININE  --  2.51* 3.33* 3.28* 3.23* 2.98*  CALCIUM  --  8.2* 7.9* 7.9* 8.4* 8.2*  MG 1.9  --   --   --   --   --   PHOS  --   --   --   --  4.2 4.1   Liver Function Tests:  Recent Labs Lab 09/22/14 1137 09/23/14 0325 09/27/14 1133 09/28/14 0503  AST 14* 14*  --   --   ALT 6* 7*  --   --   ALKPHOS 106 103  --   --  BILITOT 0.5 0.5  --   --   PROT 5.6* 5.6*  --   --   ALBUMIN 2.6* 2.5* 2.1* 2.0*    Recent Labs Lab 09/22/14 1137  LIPASE 28   No results for input(s): AMMONIA in the last 168 hours. CBC:  Recent Labs Lab 09/22/14 1137 09/23/14 0325 09/24/14 0410 09/25/14 0350 09/26/14 0348  WBC 10.8* 20.6* 13.8* 9.9 7.8  NEUTROABS 9.4*  --   --   --   --   HGB 10.4* 10.8* 9.1* 9.1* 9.2*  HCT 34.2* 35.4* 29.9* 30.1* 30.3*  MCV 93.4 92.9 92.3 95.0 92.7  PLT 148* 181 142* 128* 152   Cardiac Enzymes: No results for input(s): CKTOTAL, CKMB, CKMBINDEX, TROPONINI in the last 168 hours. BNP (last 3 results)  Recent Labs  09/08/14 1517  PROBNP 312.0*   CBG:  Recent Labs Lab 09/27/14 1207 09/27/14 1733  09/27/14 2057 09/28/14 0756 09/28/14 1146  GLUCAP 150* 129* 123* 103* 135*    Recent Results (from the past 240 hour(s))  Clostridium Difficile by PCR (not at [facility])     Status: Abnormal   Collection Time: 09/23/14  2:00 PM  Result Value Ref Range Status   C difficile by pcr POSITIVE (A) NEGATIVE Final    Comment: CRITICAL RESULT CALLED TO, READ BACK BY AND VERIFIED WITH: RN CAIN,Z AT [id number] MARTINB         Studies: Dg Chest Port 1 View  09/27/2014   CLINICAL DATA:  81 year old female with shortness of breath  EXAM: PORTABLE CHEST - 1 VIEW  COMPARISON:  Chest radiograph dated 09/25/2014  FINDINGS: Single-view of the chest demonstrate bilateral mid to lower lung field airspace opacities, increased compared to the prior study. All there are small bilateral pleural effusions. The cardiac for is are silhouetted. The osseous structures are grossly unremarkable.  IMPRESSION: Interval progression of the bilateral mid to lower lung field airspace opacities.   Electronically Signed   By: Anner Crete M.D.   On: 09/27/2014 18:59        Scheduled Meds: . aspirin EC  81 mg Oral QHS  . calcitRIOL  0.25 mcg Oral Daily  . carvedilol  25 mg Oral BID WC  . darbepoetin (ARANESP) injection - NON-DIALYSIS  40 mcg Subcutaneous Q Tue-1800  . ferrous sulfate  325 mg Oral BID WC  . furosemide  80 mg Oral BID  . heparin  5,000 Units Subcutaneous 3 times per day  . insulin aspart  0-9 Units Subcutaneous TID WC  . LORazepam  0.5 mg Oral BID  . mirtazapine  7.5 mg Oral QHS  . ondansetron  4 mg Intravenous 3 times per day  . saccharomyces boulardii  250 mg Oral BID  . simvastatin  20 mg Oral q1800  . vancomycin  125 mg Oral QID  . vitamin B-12  1,000 mcg Oral Daily   Continuous Infusions:    Principal Problem:   Nausea, vomiting and diarrhea Active Problems:   Anemia   Diabetes mellitus, type II   Hypertension   Chronic atrial fibrillation   Chronic diastolic CHF (congestive heart  failure)   CKD (chronic kidney disease), stage IV   Severe aortic stenosis   Type 2 diabetes mellitus with diabetic chronic kidney disease   Pleural effusion, right   DNR (do not resuscitate)   Flank pain   Dehydration   Abdominal pain   Leukocytosis   Palliative care encounter   SOB (shortness of breath)   Clostridium  difficile diarrhea    Time spent: 4 minutes    Nicko Daher, MD, PhD [facility] Pager [phone]  If 7PM-7AM, please contact night-coverage [email or web address] Password [email or web address] 09/28/2014, 12:18 PM    LOS: 6 days

## 2014-09-28 NOTE — Progress Notes (Signed)
[name] is up in chair and says she feels a better today. Says she walked in the halls today. A few family members at bedside, including daughter [name]. Discussed that renal function is beginning to improve with diuresis - they are happy about this. They have no questions/concerns about our conversation yesterday. No changes in goals/plan. [name] is asking when she will get to go home. I will continue to follow.   Vinie Sill, NP Palliative Medicine Team Pager # [phone] (M-F 8a-5p) Team Phone # [phone] (Nights/Weekends)

## 2014-09-28 NOTE — Progress Notes (Signed)
Physical Therapy Treatment Patient Details Name: [name] MRN: [id number] DOB: 1925-11-11 Today's Date: 09/28/2014    History of Present Illness Pt. admitted 09/22/14 with nausea, vomiting and abdominal pain.      PT Comments    Patient up in recliner and agreeable to therapy. Patient ambulating well but fatigues quickly and unable to practice steps. Patient stated "i love the steps at home, they wont be a problem". Continue to with current POC and recommend daily walks with staff.   Follow Up Recommendations  No PT follow up;Supervision/Assistance - 24 hour;Supervision for mobility/OOB     Equipment Recommendations  None recommended by PT    Recommendations for Other Services       Precautions / Restrictions Precautions Precautions: Fall Restrictions Weight Bearing Restrictions: No    Mobility  Bed Mobility               General bed mobility comments: in chair on arrival  Transfers Overall transfer level: Needs assistance Equipment used: Rolling walker (2 wheeled) Transfers: Sit to/from Stand Sit to Stand: Supervision         General transfer comment: Good safe technique. Supervision for safety  Ambulation/Gait Ambulation/Gait assistance: Supervision Ambulation Distance (Feet): 120 Feet Assistive device: Rolling walker (2 wheeled)       General Gait Details: Patient safe with use of RW. Uses rollator at home. Fatigue quickly   Stairs Stairs: Yes       General stair comments: Unable to attempt due to patient fatigue. Patient stated that she "loves" doing the steps at home and that she doesn't see there being an issue. Declined practice due to fatigue at this time  Wheelchair Mobility    Modified Rankin (Stroke Patients Only)       Balance                                    Cognition Arousal/Alertness: Awake/alert Behavior During Therapy: WFL for tasks assessed/performed Overall Cognitive Status: Within Functional Limits  for tasks assessed                      Exercises      General Comments        Pertinent Vitals/Pain Pain Assessment: No/denies pain    Home Living                      Prior Function            PT Goals (current goals can now be found in the care plan section) Progress towards PT goals: Progressing toward goals    Frequency  Min 3X/week    PT Plan Current plan remains appropriate    Co-evaluation             End of Session   Activity Tolerance: Patient tolerated treatment well;Patient limited by fatigue Patient left: in chair;with call bell/phone within reach     Time: 0909-0921 PT Time Calculation (min) (ACUTE ONLY): 12 min  Charges:  $Gait Training: 8-22 mins                    G Codes:      Jacqualyn Posey 09/28/2014, 10:05 AM  09/28/2014 Jacqualyn Posey PTA [phone] pager [phone] office

## 2014-09-28 NOTE — Progress Notes (Addendum)
Patient ID: [name], female   DOB: 1925/12/14, 81 y.o.   MRN: [id number]  [facility] Progress Note    Assessment/ Plan:   1. Acute renal failure on chronic kidney disease stage IV: This appears to be hemodynamically mediated with recent gastrointestinal losses in the face of ongoing insensible losses/diuretic therapy. Renal function appears to be doing better and urine output is fair after resumption of furosemide-symptomatically she states she feels well.. 2. Anemia: Status post intravenous iron and Aranesp yesterday for continued treatment of anemia of chronic kidney disease.. 3. Metabolic bone disease: Corrected calcium level is about 9, phosphorus levels are acceptable-recheck today 4. Deconditioning/malnutrition: unfortunately progressive deconditioning with recurrent hospitalizations and multiple medical problems- she echoes the sentiment from prior discussions with other providers regarding not to do dialysis. 5. Clostridium difficile colitis: Improving with oral vancomycin-colostomy output back to her usual baseline  Will sign off at this time-patient recommended to follow up with Dr. Moshe Cipro as previously scheduled  Subjective:   Reports to have had a better night with decreased shortness of breath-concerned that he was in much fluid drainage from Pleurx tube    Objective:   BP 138/42 mmHg  Pulse 65  Temp(Src) 97.7 F (36.5 C) (Oral)  Resp 18  Ht 5\' 3"  (1.6 m)  Wt 82.192 kg (181 lb 3.2 oz)  BMI 32.11 kg/m2  SpO2 98%  Intake/Output Summary (Last 24 hours) at 09/28/14 1006 Last data filed at 09/28/14 0802  Gross per 24 hour  Intake    360 ml  Output   1275 ml  Net   -915 ml   Weight change: -0.544 kg (-1 lb 3.2 oz)  Physical Exam: Gen: comfortably resting in recliner CVS: Pulse irregularly irregular, S1 and S2 normal Resp: Decreased breath sounds over bases otherwise clear to auscultation Abd: Soft, obese, nontender Ext: Trace lower extremity  edema  Imaging: Dg Chest Port 1 View  09/27/2014   CLINICAL DATA:  81 year old female with shortness of breath  EXAM: PORTABLE CHEST - 1 VIEW  COMPARISON:  Chest radiograph dated 09/25/2014  FINDINGS: Single-view of the chest demonstrate bilateral mid to lower lung field airspace opacities, increased compared to the prior study. All there are small bilateral pleural effusions. The cardiac for is are silhouetted. The osseous structures are grossly unremarkable.  IMPRESSION: Interval progression of the bilateral mid to lower lung field airspace opacities.   Electronically Signed   By: Anner Crete M.D.   On: 09/27/2014 18:59    Labs: BMET  Recent Labs Lab 09/22/14 1137 09/23/14 0325 09/25/14 0350 09/26/14 0348 09/27/14 1133 09/28/14 0503  NA 140 137 136 138 139 137  K 3.8 3.6 4.3 4.2 5.0 4.1  CL 104 101 108 106 109 106  CO2 26 25 21* 24 24 24   GLUCOSE 182* 149* 91 128* 133* 91  BUN 44* 41* 48* 49* 44* 41*  CREATININE 2.70* 2.51* 3.33* 3.28* 3.23* 2.98*  CALCIUM 8.6* 8.2* 7.9* 7.9* 8.4* 8.2*  PHOS  --   --   --   --  4.2 4.1   CBC  Recent Labs Lab 09/22/14 1137 09/23/14 0325 09/24/14 0410 09/25/14 0350 09/26/14 0348  WBC 10.8* 20.6* 13.8* 9.9 7.8  NEUTROABS 9.4*  --   --   --   --   HGB 10.4* 10.8* 9.1* 9.1* 9.2*  HCT 34.2* 35.4* 29.9* 30.1* 30.3*  MCV 93.4 92.9 92.3 95.0 92.7  PLT 148* 181 142* 128* 152    Medications:    .  aspirin EC  81 mg Oral QHS  . calcitRIOL  0.25 mcg Oral Daily  . carvedilol  25 mg Oral BID WC  . darbepoetin (ARANESP) injection - NON-DIALYSIS  40 mcg Subcutaneous Q Tue-1800  . ferrous sulfate  325 mg Oral BID WC  . furosemide  80 mg Oral BID  . heparin  5,000 Units Subcutaneous 3 times per day  . insulin aspart  0-9 Units Subcutaneous TID WC  . LORazepam  0.5 mg Oral BID  . mirtazapine  7.5 mg Oral QHS  . ondansetron  4 mg Intravenous 3 times per day  . saccharomyces boulardii  250 mg Oral BID  . simvastatin  20 mg Oral q1800  .  vancomycin  125 mg Oral QID  . vitamin B-12  1,000 mcg Oral Daily   Elmarie Shiley, MD 09/28/2014, 10:06 AM

## 2014-09-29 ENCOUNTER — Encounter (HOSPITAL_COMMUNITY): Payer: Medicare Other

## 2014-09-29 DIAGNOSIS — Z66 Do not resuscitate: Secondary | ICD-10-CM

## 2014-09-29 LAB — GLUCOSE, CAPILLARY
GLUCOSE-CAPILLARY: 136 mg/dL — AB (ref 65–99)
Glucose-Capillary: 91 mg/dL (ref 65–99)

## 2014-09-29 MED ORDER — VANCOMYCIN HCL 125 MG PO CAPS: 125.0000 mg | ORAL_CAPSULE | Freq: Four times a day (QID) | ORAL | Status: DC

## 2014-09-29 MED ORDER — SACCHAROMYCES BOULARDII 250 MG PO CAPS: 250.0000 mg | ORAL_CAPSULE | Freq: Two times a day (BID) | ORAL | Status: DC

## 2014-09-29 NOTE — Discharge Summary (Addendum)
Discharge Summary  [name] ZMO:[id number] DOB: December 04, 1925  PCP: Abigail Miyamoto, MD  Admit date: 09/22/2014 Discharge date: 09/29/2014  Time spent: >28mins  Recommendations for Outpatient Follow-up:  1. F/u with PMD within a week for hospital discharge follow up 2. F/u with cardiothoracic surgery for right pleural effusion and aortic stenosis (detail listed below) 3. F/u with cardiology for chf/afib 4. F/u with pulmonology for pleural effusion 5. F/u with nephrology for ckd, patient states she is interested in hemodialysis if needed, she states she will discuss this with Dr Clover Mealy.   Discharge Diagnoses:  Active Hospital Problems   Diagnosis Date Noted  . Nausea, vomiting and diarrhea 09/22/2014  . Palliative care encounter 09/27/2014  . SOB (shortness of breath)   . Clostridium difficile diarrhea   . Flank pain 09/22/2014  . Dehydration 09/22/2014  . Abdominal pain 09/22/2014  . Leukocytosis 09/22/2014  . Pleural effusion, right 07/14/2014  . DNR (do not resuscitate)   . Type 2 diabetes mellitus with diabetic chronic kidney disease   . Chronic diastolic CHF (congestive heart failure)   . CKD (chronic kidney disease), stage IV   . Severe aortic stenosis   . Chronic atrial fibrillation   . Diabetes mellitus, type II 12/06/2011  . Hypertension 12/06/2011  . Anemia 12/06/2011    Resolved Hospital Problems   Diagnosis Date Noted Date Resolved  No resolved problems to display.    Discharge Condition: stable, discharged on home oxygen 2liters  Diet recommendation: heart healthy/carb modified  Filed Weights   09/27/14 0531 09/28/14 0452 09/29/14 0517  Weight: 82.736 kg (182 lb 6.4 oz) 82.192 kg (181 lb 3.2 oz) 81.466 kg (179 lb 9.6 oz)    History of present illness:  [name] is a 81 y.o. female  With complex medical history was prior history of chronic kidney disease stage IV with placement of an AV fistula 2014, insulin-dependent diabetes mellitus, chronic  diastolic heart failure, status post recent Pleurx catheter placement for drainage of recurrent symptomatic pleural effusion, remote history of colon cancer status post colostomy 40 years now, history of a calculus cholecystitis status post percutaneous drain placement in April 2016, history of severe aortic stenosis being followed by cardiology as outpatient, history of chronic A. fib not anticoagulation candidate secondary to increased risk of falls, advanced age, history of bilateral carotid disease, diabetes mellitus with diabetic retinopathy, anemia who presents to the ED with sudden onset of nausea, nonbilious, nonbloody emesis, diffuse abdominal pain which started at 3 AM on the morning of admission. Patient denies any fever or chills, no chest pain, no dysuria, no constipation. Patient does endorse chronic shortness of breath with this unchanged. Patient also endorses increased colostomy output with watery stools which have been ongoing for the past 2-3 days. Patient denies any melanoma however states has black stools secondary to being on iron pills. Patient denies any hematemesis. She denies any hematochezia. Patient with decreased oral intake on the day of admission however has had adequate oral input. Patient denies any recent antibiotic use. Patient denies any recent travel. Patient denies any recent change in diet. Patient was seen in the emergency room, company has a metabolic profile with a BUN of 44 creatinine of 2.7 glucose of 182 with abdomen of 2.6 AST of 14 ALT of 6 otherwise was within normal limits. Lipase level is within normal limits at 28. CBC had a white count of 10.8 hemoglobin of 10.4 platelet count of 148 otherwise within normal limits. Chest x-ray  which was done showed some improvement in right lung and reactions since prior study subsequent to placement of right-sided chest 2. Decreased pleural effusion on the right. A posterior hydro-pneumothorax on the right is no longer evident.  Small anterior loculated hydropneumothorax is persistent on the current study. No new abnormalities. CT abdomen and pelvis with persistent right-sided hydropneumothorax with a Pleurx drainage catheter in the right pleural space. Right lower quadrant colostomy with a large parastomal hernia containing stomach small bowel and colon no obstruction or incarceration. Pneumobilia likely due to prior sphincterectomy. Distended gallbladder with small gallstones. Advanced atherosclerotic calcifications involving the aorta and branch vessels. Very heterogeneous appearance of bone marrow.   Hospital Course:  Principal Problem:   Nausea, vomiting and diarrhea Active Problems:   Anemia   Diabetes mellitus, type II   Hypertension   Chronic atrial fibrillation   Chronic diastolic CHF (congestive heart failure)   CKD (chronic kidney disease), stage IV   Severe aortic stenosis   Type 2 diabetes mellitus with diabetic chronic kidney disease   Pleural effusion, right   DNR (do not resuscitate)   Flank pain   Dehydration   Abdominal pain   Leukocytosis   Palliative care encounter   SOB (shortness of breath)   Clostridium difficile diarrhea  C. difficile diarrhea/colitis - CT abdomen without contrast showed no acute findings. - Started oral vancomycin 7/2 and complete 14 days' treatment. - Started probiotics. Minimize unnecessary antibiotic use. - Nausea, vomiting and abdominal pain resolved - Diarrhea stopped since 7/6. -discharged home with 46more days of oral vancomycin to finish 14day treatment.  Hypotension - Overnight 09/23/14 had blood pressure of 79/58. Resolved after IV fluids.  Acute on Stage IV chronic kidney disease - Baseline creatinine 2.4 - Creatinine has increased on 7/4 to 3.3 from 2.5. Possibly related to dehydration earlier/? ATN. DC the IV fluids secondary to concern for pulmonary edema. Trial of a dose of IV Lasix on 7/4.  - Creatinine remained at 3.2. Nephrology consulted: Acute  renal failure appears to be hemodynamically mediated-trial of additional IV fluids. -7/6 not able to tolerate additional iv fluids due to c/o sob, patient states she agrees with dialysis if needed. -renal function improving with diuresis.  -lasix 80mg  po bid continued at discharge, outpatient follow up with nephrology.   Chronic diastolic CHF - c/o sob, has been requiring oxygen supplement since in the hospital, c/o sob, not able to lay flat, denies chest pain, not able to tolerate fluids,  -lasix 80mg  po bid restarted on 7/6 by nephrology -improving, good urine output, less edema -wean off oxygen, outpatient cardiology follow up -ambulate check oxygen saturation, patient prefer to have home oxygen, care manager consulted to arrange home oxygen if indicated. Addendum: pulse oxygen saturation dropped to 87% on ambulation, patient is discharged on home oxygen.  Chronic/recurrent right pleural effusion, s/p Pleurx catheter placement - Continue to drain Pleurx catheter every 2 days and outpatient follow-up with thoracic surgery.  - As per nursing, Pleurx catheter was drained on 7/2 for 350 mL fluid, on 7/4 for 450 mL and drained on 7/6 -reported slow drain on 7/6 with only 200cc out, repeat cxr persistent pleural effusion, outpatietn vascular surgery follow up, continue lasix.  Severe symptomatic aortic stenosis - Cardiology is coordinating care with multiple specialists. Patient and family considering TAVR   Chronic A. Fib - Rate controlled. Not anticoagulation candidate.  Type II DM/IDDM - oral hypoglycemics held in the hospital and resumed at discharge. - Controlled.  Anemia -  Hemoglobin stable over last 72 hours. IV iron per nephrology and a dose of Aranesp.  Leukocytosis -Most likely secondary to C. difficile. Resolved  Mild thrombocytopenia - Probably due to acute infection. Resolved  Failure to thrive - Due to advanced age, frail health and multiple severe significant  medical problems. Discussed at length with patient's 2 daughters at bedside on 7/5 along with patient. They are agreeable to palliative care consultation-meeting scheduled for 7/6. -as of 7/6, patient and family want to have dialysis/TAVR -advised patient to communicate with nephrology about her decision regarding dialysis,patient states she will talk to Dr. Clover Mealy.  Dysuria: ua +bacteria, no fever, no leukocytosis, ,  urine culture pending at discharge, patient denies dysuria or any urinary symptom at discharge, will incline not prescribing abx in the setting of asymptomatic bacteria and patient is getting active treatment for Cdiff. Advised patient to monitor urinary symptom, report to pmd if + symptom.   DVT prophylaxis in the hospital: Subcutaneous heparin Code Status: DO NOT RESUSCITATE Family Communication: Discussed with daughters [name] & [name] on 09/26/14, multiple family members in room on 7/6. Disposition Plan: home   Consultants:  Nephrology  Palliative care medicine  Procedures:  Has right upper quadrant Pleurx catheter PTA  Left-sided colostomy PTA  Antibiotics:  Oral vancomycin 7/2 >  Discharge Exam: BP 128/56 mmHg  Pulse 62  Temp(Src) 97.6 F (36.4 C) (Oral)  Resp 18  Ht 5\' 3"  (1.6 m)  Wt 81.466 kg (179 lb 9.6 oz)  BMI 31.82 kg/m2  SpO2 95%  General: frail, NAD, AAOX4 Cardiovascular: IRRR, + murmur Respiratory: decreased at basis Extremity: subsided pitting edema bilaterally  Discharge Instructions You were cared for by a hospitalist during your hospital stay. If you have any questions about your discharge medications or the care you received while you were in the hospital after you are discharged, you can call the unit and asked to speak with the hospitalist on call if the hospitalist that took care of you is not available. Once you are discharged, your primary care physician will handle any further medical issues. Please note that NO  REFILLS for any discharge medications will be authorized once you are discharged, as it is imperative that you return to your primary care physician (or establish a relationship with a primary care physician if you do not have one) for your aftercare needs so that they can reassess your need for medications and monitor your lab values.  Discharge Instructions    Diet - low sodium heart healthy    Complete by:  As directed      Face-to-face encounter (required for Medicare/Medicaid patients)    Complete by:  As directed   I Florine Sprenkle certify that this patient is under my care and that I, or a nurse practitioner or physician's assistant working with me, had a face-to-face encounter that meets the physician face-to-face encounter requirements with this patient on 09/29/2014. The encounter with the patient was in whole, or in part for the following medical condition(s) which is the primary reason for home health care (List medical condition): FTT/sob  The encounter with the patient was in whole, or in part, for the following medical condition, which is the primary reason for home health care:  FTT  I certify that, based on my findings, the following services are medically necessary home health services:   Nursing Physical therapy    Reason for Medically Necessary Home Health Services:  Skilled Nursing- Change/Decline in Patient Status  My  clinical findings support the need for the above services:  Shortness of breath with activity  Further, I certify that my clinical findings support that this patient is homebound due to:  Shortness of Breath with activity     For home use only DME oxygen    Complete by:  As directed   Mode or (Route):  Nasal cannula  Liters per Minute:  2  Frequency:  Continuous (stationary and portable oxygen unit needed)  Oxygen delivery system:  Gas     Home Health    Complete by:  As directed   To provide the following care/treatments:   PT RN Home Health Aide       Increase  activity slowly    Complete by:  As directed             Medication List    TAKE these medications        acetaminophen 500 MG tablet  Commonly known as:  TYLENOL  Take 500 mg by mouth every 6 (six) hours as needed for mild pain.     aspirin EC 81 MG tablet  Take 81 mg by mouth at bedtime.     calcitRIOL 0.25 MCG capsule  Commonly known as:  ROCALTROL  Take 0.25 mcg by mouth daily.     carvedilol 25 MG tablet  Commonly known as:  COREG  Take 25 mg by mouth 2 (two) times daily.     clotrimazole-betamethasone lotion  Commonly known as:  LOTRISONE  Apply 1 application topically daily as needed (apply to rash on stomach as needed).     docusate sodium 100 MG capsule  Commonly known as:  COLACE  Take 100 mg by mouth daily as needed for constipation.     epoetin alfa 20000 UNIT/ML injection  Commonly known as:  EPOGEN,PROCRIT  20,000 Units. Every two weeks     ferrous sulfate 325 (65 FE) MG tablet  Take 1 tablet (325 mg total) by mouth 2 (two) times daily with a meal.     furosemide 80 MG tablet  Commonly known as:  LASIX  Take 1 tablet (80 mg total) by mouth 2 (two) times daily.     HYDROcodone-acetaminophen 5-325 MG per tablet  Commonly known as:  NORCO/VICODIN  Take 1 tablet by mouth every 4 (four) hours as needed for moderate pain.     insulin glargine 100 UNIT/ML injection  Commonly known as:  LANTUS  Inject 8 Units into the skin at bedtime as needed (if cbg over 200).     linagliptin 5 MG Tabs tablet  Commonly known as:  TRADJENTA  Take 5 mg by mouth daily.     LORazepam 0.5 MG tablet  Commonly known as:  ATIVAN  Take 0.5 mg by mouth 2 (two) times daily.     meclizine 12.5 MG tablet  Commonly known as:  ANTIVERT  Take 12.5 mg by mouth as needed for dizziness.     mirtazapine 7.5 MG tablet  Commonly known as:  REMERON  Take 7.5 mg by mouth at bedtime.     promethazine 12.5 MG tablet  Commonly known as:  PHENERGAN  Take 1 tablet (12.5 mg total) by  mouth every 6 (six) hours as needed for nausea or vomiting.     saccharomyces boulardii 250 MG capsule  Commonly known as:  FLORASTOR  Take 1 capsule (250 mg total) by mouth 2 (two) times daily.     simvastatin 20 MG tablet  Commonly known as:  ZOCOR  TAKE 1 TABLET (20 MG TOTAL) BY MOUTH AT BEDTIME.     traMADol 50 MG tablet  Commonly known as:  ULTRAM  Take 1 tablet (50 mg total) by mouth every 6 (six) hours as needed for moderate pain.     vancomycin 125 MG capsule  Commonly known as:  VANCOCIN HCL  Take 1 capsule (125 mg total) by mouth 4 (four) times daily.     vitamin B-12 1000 MCG tablet  Commonly known as:  CYANOCOBALAMIN  Take 1,000 mcg by mouth daily.       Allergies  Allergen Reactions  . Levaquin [Levofloxacin] Nausea Only  . Codeine Nausea Only  . Keflet [Cephalexin] Nausea Only  . Morphine And Related Nausea Only  . Oxycodone Other (See Comments)    Abnormal behavior       Follow-up Information    Follow up with FRIED, ROBERT L, MD In 1 week.   Specialty:  Family Medicine   Why:  hospital discharge follow up   Contact information:   [address] [place] [address] [phone]       Follow up with Melrose Nakayama, MD On 10/03/2014.   Specialty:  Cardiothoracic Surgery   Why:  pleural effusion   Contact information:   [address] [place] [address] [phone]       Follow up with Rexene Alberts, MD On 10/16/2014.   Specialty:  Cardiothoracic Surgery   Why:  aortic stenosis   Contact information:   [place] [address] [phone]       Follow up with Simonne Maffucci, MD On 10/13/2014.   Specialty:  Pulmonary Disease   Why:  right pleural effusion   Contact information:   [address] [place] [address] [phone]       Follow up with [name], MD In 3 weeks.   Specialty:  Cardiology   Why:  chf   Contact information:   [address] [place] [address] [phone]       Follow up with Louis Meckel, MD In 1 month.   Specialty:  Nephrology   Why:  monitor renal function, patient is interested in hemodialysis if needed   Contact information:   [place] [address] [phone]        The results of significant diagnostics from this hospitalization (including imaging, microbiology, ancillary and laboratory) are listed below for reference.    Significant Diagnostic Studies: Ct Abdomen Pelvis Wo Contrast  09/22/2014   CLINICAL DATA:  Nausea and vomiting since yesterday.  EXAM: CT ABDOMEN AND PELVIS WITHOUT CONTRAST  TECHNIQUE: Multidetector CT imaging of the abdomen and pelvis was performed following the standard protocol without IV contrast.  COMPARISON:  Chest CT 08/30/2014  FINDINGS: Lower chest: The lung bases demonstrate a complex right-sided hydro pneumothorax. A PleurX drainage catheter is in place. There is also a small left pleural effusion and minimal overlying atelectasis. Stable cardiac enlargement and tortuosity, ectasia and calcification of the thoracic aorta. Three-vessel coronary artery calcifications are again noted.  Hepatobiliary: No focal hepatic lesions. Pneumobilia is noted and likely due to prior sphincterotomy. Mild common bile duct dilatation and a small amount of air the common bowel duct. The gallbladder is distended and there are small gallstones. No pericholecystic fluid.  Pancreas: Moderate pancreatic atrophy but no mass or inflammation.  Spleen: Normal size.  No focal lesions.  Adrenals/Urinary  Tract: The adrenal glands are normal. No renal calculi. Bilateral renal cysts.  Stomach/Bowel: The stomach, duodenum, small bowel and colon are grossly normal. Prior rectosigmoid resection and left-sided colostomy. There is a large parastomal hernia which contains part of the stomach, small bowel loops and colon. No obstructive findings. Stable surgical changes involving the cecum.   Vascular/Lymphatic: Advanced atherosclerotic calcifications involving the aorta and branch vessels. There are borderline mesenteric and retroperitoneal lymph nodes but no mass or overt adenopathy.  Other: The bladder appears normal. No pelvic mass or lymphadenopathy. No free pelvic fluid collections. No inguinal mass or adenopathy. Bilateral groin hernias.  Musculoskeletal: Advanced degenerative changes involving the spine and osteoporosis. Multilevel Schmorl's nodes. Could not exclude metastatic bone disease.  IMPRESSION: 1. Persistent right-sided hydropneumothorax with a PleurX drainage catheter in the right pleural space. 2. Right lower quadrant colostomy with a large parastomal hernia containing stomach, small bowel and colon. No obstruction or incarceration. 3. Pneumobilia, likely due to prior sphincterotomy. 4. Distended gallbladder with small gallstones. 5. Advanced atherosclerotic calcifications involving the aorta and branch vessels. 6. Very heterogeneous appearance of the bone marrow. Could not exclude metastatic disease or myeloma. Recommend clinical correlation.   Electronically Signed   By: Marijo Sanes M.D.   On: 09/22/2014 15:18   Dg Chest 2 View  09/22/2014   CLINICAL DATA:  Pt coming from home with chief complaint of flank pain. Pt has other multiple complaints, drain for pleural effusion, gall bladder drain removed on May 2. Pt reports nausea and vomiting since around 0300.  EXAM: CHEST  2 VIEW  COMPARISON:  09/12/2014  FINDINGS: Right-sided chest tube has its tip near the right apex. This is new since the prior exam.  Right loculated hydro pneumothoraces project in the anterior mid to lower hemithorax. Hydro pneumothorax noted more posteriorly on the prior study is no longer evident.  Small bilateral pleural effusions, decreased on the right from the prior study. Right lower lung zone opacity is improved when compared the prior study. Left lung is essentially clear. No pulmonary edema.  No  apical pneumothorax.  Cardiac silhouette is normal in size.  IMPRESSION: 1. There has been some improvement in the right lung aeration since the prior study subsequent to placement of a right-sided chest tube. There is decreased pleural fluid on the right. A posterior hydro pneumothorax on the right is no longer evident. More anterior loculated hydro pneumothoraces persist on the current study. 2. No new abnormalities.   Electronically Signed   By: Lajean Manes M.D.   On: 09/22/2014 11:50   Dg Chest 2 View  09/12/2014   CLINICAL DATA:  81 year old female preoperative study for pleural drainage. Initial encounter.  History of colon cancer, right hydro pneumothorax.  EXAM: CHEST  2 VIEW  COMPARISON:  Chest radiographs 09/01/2014, chest CT 08/30/2014, and earlier  FINDINGS: Mostly anterior loculated right hydro pneumothorax persists, and has not significantly changed since 09/01/2014. Stable lung volumes. Stable cardiomegaly and mediastinal contours. No pulmonary edema. Trace if any left pleural effusion is stable. No new pulmonary opacity. Osteopenia. Stable visualized osseous structures.  IMPRESSION: Stable chest with right side multiloculated hydropneumothorax. No new cardiopulmonary abnormality.   Electronically Signed   By: Genevie Ann M.D.   On: 09/12/2014 06:58   Dg Chest 2 View  09/01/2014   CLINICAL DATA:  Follow-up right pleural effusion  EXAM: CHEST  2 VIEW  COMPARISON:  CT chest dated 09/21/2014  FINDINGS: Small to moderate right hydropneumothorax, likely mildly decreased.  Left lung is clear.  Cardiomegaly.  IMPRESSION: Small to moderate right hydropneumothorax, likely mildly decreased.   Electronically Signed   By: Julian Hy M.D.   On: 09/01/2014 16:57   Ct Chest Wo Contrast  08/30/2014   CLINICAL DATA:  Follow up right pleural effusion. Post right thoracentesis today. History of colon cancer, diabetes and chronic renal failure on Hemodialysis.  EXAM: CT CHEST WITHOUT CONTRAST  TECHNIQUE:  Multidetector CT imaging of the chest was performed following the standard protocol without IV contrast.  COMPARISON:  Radiographs 08/29/2014.  CT 07/14/2014.  FINDINGS: Mediastinum/Nodes: There are no enlarged mediastinal, hilar or axillary lymph nodes. The thyroid gland, trachea and esophagus demonstrate no significant findings. The heart size is normal. There is a small pericardial effusion. There is diffuse atherosclerosis of the aorta, great vessels and coronary arteries.  Lungs/Pleura: Right pleural effusion has decreased in volume following thoracentesis. There is a moderate-sized right basilar hydro pneumothorax with partial collapse of the right middle and lower lobes. The upper lobe remains well expanded. There is no mediastinal shift. There is persistent airspace disease posteriorly in the right middle lobe with volume loss and probable fluid in the right middle lobe bronchus. There is minimal atelectasis at the left lung base. A small left pleural effusion appears unchanged.  Upper abdomen:  Unremarkable.  There is no adrenal mass.  Musculoskeletal/Chest wall: There is no chest wall mass or suspicious osseous finding. Asymmetric glenohumeral degenerative changes noted on the right.  IMPRESSION: 1. Moderate size loculated hydro pneumothorax on the right following thoracentesis today. Appearance suggests underlying adhesions of the right pleural space. No evidence of tension component. 2. Persistent partial collapse of the right middle lobe with airspace disease and probable fluid in the bronchus. 3. Stable small simple left pleural effusion. 4. Diffuse atherosclerosis. 5. These results were discussed by telephone with Dr. Markus Daft, covering interventional services at [facility] today. He will relay the results to Dr. Simonne Maffucci.   Electronically Signed   By: Richardean Sale M.D.   On: 08/30/2014 13:28   Dg Chest Port 1 View  09/27/2014   CLINICAL DATA:  81 year old female with shortness  of breath  EXAM: PORTABLE CHEST - 1 VIEW  COMPARISON:  Chest radiograph dated 09/25/2014  FINDINGS: Single-view of the chest demonstrate bilateral mid to lower lung field airspace opacities, increased compared to the prior study. All there are small bilateral pleural effusions. The cardiac for is are silhouetted. The osseous structures are grossly unremarkable.  IMPRESSION: Interval progression of the bilateral mid to lower lung field airspace opacities.   Electronically Signed   By: Anner Crete M.D.   On: 09/27/2014 18:59   Dg Chest Port 1 View  09/25/2014   CLINICAL DATA:  Nausea, vomiting, shortness of Breath  EXAM: PORTABLE CHEST - 1 VIEW  COMPARISON:  09/22/2014  FINDINGS: Right PleurX catheter in place. There are small bilateral pleural effusions, right slightly greater than left. Left effusion is new since prior study. Bibasilar atelectasis or infiltrates, right greater than left. This is increased since prior study. Heart is likely upper limits normal in size.  IMPRESSION: Small bilateral pleural effusions with bibasilar atelectasis or infiltrates, right greater than left. Right PleurX catheter again noted.   Electronically Signed   By: Rolm Baptise M.D.   On: 09/25/2014 10:30   Dg C-arm 1-60 Min-no Report  09/12/2014   CLINICAL DATA: surgery   C-ARM 1-60 MINUTES  Fluoroscopy was utilized by the requesting physician.  No radiographic  interpretation.    US Thoracentesis Asp Pleural Space W/img Guide  08/30/2014   INDICATION: Symptomatic right sided pleural effusion.  EXAM: ULTRASOUND-GUIDED RIGHT THORACENTESIS  COMPARISON:  07/19/14 Thoracentesis.  MEDICATIONS: None  COMPLICATIONS: None immediate. CT Chest post procedure revealed a "trapped" right lung-hydropneumothorax, patient is asymptomatic and has a F/U appointment with Dr. Lake Bells on Friday 09/01/14 with a CXR, these results have been discussed today with the patient and her family as well as Dr. Lake Bells. She will be with her family overnight  and was instructed to go to the Emergency Department with any chest pain or shortness of breath, she states understanding.  TECHNIQUE: Informed written consent was obtained from the patient after a discussion of the risks, benefits and alternatives to treatment. A timeout was performed prior to the initiation of the procedure.  Initial ultrasound scanning demonstrates a right pleural effusion. The lower chest was prepped and draped in the usual sterile fashion. 1% lidocaine was used for local anesthesia.  An ultrasound image was saved for documentation purposes. A 6 Fr Safe-T-Centesis catheter was introduced. The thoracentesis was performed. The catheter was removed and a dressing was applied. The patient tolerated the procedure well without immediate post procedural complication. The patient was escorted to have a CT Chest that was previously ordered.  FINDINGS: A total of approximately 1.4 liters of serous fluid was removed. Requested samples were sent to the laboratory.  IMPRESSION: Successful ultrasound-guided right sided thoracentesis yielding 1.4 liters of pleural fluid.  Read By:  Tsosie Billing PA-C   Electronically Signed   By: Markus Daft M.D.   On: 08/30/2014 13:41    Microbiology: Recent Results (from the past 240 hour(s))  Clostridium Difficile by PCR (not at [facility])     Status: Abnormal   Collection Time: 09/23/14  2:00 PM  Result Value Ref Range Status   C difficile by pcr POSITIVE (A) NEGATIVE Final    Comment: CRITICAL RESULT CALLED TO, READ BACK BY AND VERIFIED WITH: RN CAIN,Z AT [phone] MARTINB      Labs: Basic Metabolic Panel:  Recent Labs Lab 09/22/14 2232 09/23/14 0325 09/25/14 0350 09/26/14 0348 09/27/14 1133 09/28/14 0503  NA  --  137 136 138 139 137  K  --  3.6 4.3 4.2 5.0 4.1  CL  --  101 108 106 109 106  CO2  --  25 21* 24 24 24   GLUCOSE  --  149* 91 128* 133* 91  BUN  --  41* 48* 49* 44* 41*  CREATININE  --  2.51* 3.33* 3.28* 3.23* 2.98*  CALCIUM  --   8.2* 7.9* 7.9* 8.4* 8.2*  MG 1.9  --   --   --   --   --   PHOS  --   --   --   --  4.2 4.1   Liver Function Tests:  Recent Labs Lab 09/22/14 1137 09/23/14 0325 09/27/14 1133 09/28/14 0503  AST 14* 14*  --   --   ALT 6* 7*  --   --   ALKPHOS 106 103  --   --   BILITOT 0.5 0.5  --   --   PROT 5.6* 5.6*  --   --   ALBUMIN 2.6* 2.5* 2.1* 2.0*    Recent Labs Lab 09/22/14 1137  LIPASE 28   No results for input(s): AMMONIA in the last 168 hours. CBC:  Recent Labs Lab 09/22/14 1137 09/23/14 0325 09/24/14 0410 09/25/14 0350 09/26/14  0348  WBC 10.8* 20.6* 13.8* 9.9 7.8  NEUTROABS 9.4*  --   --   --   --   HGB 10.4* 10.8* 9.1* 9.1* 9.2*  HCT 34.2* 35.4* 29.9* 30.1* 30.3*  MCV 93.4 92.9 92.3 95.0 92.7  PLT 148* 181 142* 128* 152   Cardiac Enzymes: No results for input(s): CKTOTAL, CKMB, CKMBINDEX, TROPONINI in the last 168 hours. BNP: BNP (last 3 results)  Recent Labs  03/15/14 0914 07/14/14 1125  BNP 941.4* 526.1*    ProBNP (last 3 results)  Recent Labs  09/08/14 1517  PROBNP 312.0*    CBG:  Recent Labs Lab 09/28/14 0756 09/28/14 1146 09/28/14 1638 09/28/14 2148 09/29/14 0727  GLUCAP 103* 135* 156* 123* 91       Signed:  Diem Dicocco MD, PhD  [facility] 09/29/2014, 9:58 AM

## 2014-09-29 NOTE — Care Management Note (Signed)
Case Management Note  Patient Details  Name: CHRYSTEN WOULFE MRN: [id number] Date of Birth: 1925-11-25  Subjective/Objective:                    Action/Plan: Home oxygen ordered through [place]  Expected Discharge Date:                  Expected Discharge Plan:  [place]  In-House Referral:     Discharge planning Services  CM Consult  Post Acute Care Choice:  Home Health Choice offered to:  Patient  DME Arranged:  Oxygen DME Agency:  [place] Arranged:  RN, PT, Nurse's Aide [facility] Agency:  [place]  Status of Service:  In process, will continue to follow  Medicare Important Message Given:  Yes-second notification given Date Medicare IM Given:    Medicare IM give by:    Date Additional Medicare IM Given:    Additional Medicare Important Message give by:     If discussed at [place] of Stay Meetings, dates discussed:    Additional Comments:  Marilu Favre, RN 09/29/2014, 11:20 AM

## 2014-09-29 NOTE — Care Management (Signed)
Important Message  Patient Details  Name: [name] MRN: [id number] Date of Birth: Oct 05, 1925   Medicare Important Message Given:  Yes-second notification given    Marilu Favre, RN 09/29/2014, 11:51 AM

## 2014-09-29 NOTE — Progress Notes (Signed)
Pt. And family got d/c instructions and follow up appointments.Pt. Ready to go home with her daughters.

## 2014-09-29 NOTE — Progress Notes (Signed)
Patient declined new IV at this time. Old IV was leaking and was removed.

## 2014-09-29 NOTE — Care Management (Signed)
SATURATION QUALIFICATIONS: (This note is used to comply with regulatory documentation for home oxygen)  Patient Saturations on Room Air at Rest = 97  Patient Saturations on Room Air while Ambulating = 87  Patient Saturations on 2 Liters of oxygen while Ambulating = 95%  Please briefly explain why patient needs home oxygen: CHF

## 2014-09-29 NOTE — Care Management Note (Addendum)
Case Management Note  Patient Details  Name: BRENNYN ORTLIEB MRN: [id number] Date of Birth: 10-28-1925  Subjective/Objective:                    Action/Plan:  Have asked bedside nurse for oxygen sat note for home oxygen  Expected Discharge Date:                  Expected Discharge Plan:  [place]  In-House Referral:     Discharge planning Services  CM Consult  Post Acute Care Choice:  Home Health Choice offered to:  Patient  DME Arranged:    DME Agency:     [facility] Arranged:  RN. PT aide  [place] Agency:  [place]  Status of Service:  In process, will continue to follow  Medicare Important Message Given:  Yes-second notification given Date Medicare IM Given:    Medicare IM give by:    Date Additional Medicare IM Given:    Additional Medicare Important Message give by:     If discussed at [place] of Stay Meetings, dates discussed:    Additional Comments:  Marilu Favre, RN 09/29/2014, 10:34 AM

## 2014-10-01 LAB — URINE CULTURE: Culture: >=100000

## 2014-10-02 ENCOUNTER — Other Ambulatory Visit: Payer: Self-pay | Admitting: Thoracic Surgery (Cardiothoracic Vascular Surgery)

## 2014-10-02 DIAGNOSIS — J90 Pleural effusion, not elsewhere classified: Secondary | ICD-10-CM

## 2014-10-03 ENCOUNTER — Ambulatory Visit
Admission: RE | Admit: 2014-10-03 | Discharge: 2014-10-03 | Disposition: A | Discharge status: Home / Self Care | Payer: Medicare Other | Source: Ambulatory Visit | Attending: Thoracic Surgery (Cardiothoracic Vascular Surgery) | Admitting: Thoracic Surgery (Cardiothoracic Vascular Surgery)

## 2014-10-03 ENCOUNTER — Encounter: Payer: Self-pay | Admitting: Thoracic Surgery (Cardiothoracic Vascular Surgery)

## 2014-10-03 ENCOUNTER — Ambulatory Visit (INDEPENDENT_AMBULATORY_CARE_PROVIDER_SITE_OTHER): Payer: Medicare Other | Admitting: Thoracic Surgery (Cardiothoracic Vascular Surgery)

## 2014-10-03 VITALS — BP 142/73 | HR 71 | Resp 20 | Ht 63.0 in | Wt 179.0 lb

## 2014-10-03 DIAGNOSIS — J90 Pleural effusion, not elsewhere classified: Secondary | ICD-10-CM

## 2014-10-03 DIAGNOSIS — I5032 Chronic diastolic (congestive) heart failure: Secondary | ICD-10-CM

## 2014-10-03 DIAGNOSIS — J948 Other specified pleural conditions: Secondary | ICD-10-CM | POA: Diagnosis not present

## 2014-10-03 DIAGNOSIS — I6523 Occlusion and stenosis of bilateral carotid arteries: Secondary | ICD-10-CM

## 2014-10-03 NOTE — Progress Notes (Signed)
[address]Suite [address]       [place], [address]             [phone]       HPI:  Mrs. Whidbee returns today for follow-up regarding her right pleural effusion.  She is an 81 year old woman with multiple medical problems who had a recurrent right pleural effusion. She required 3 thoracenteses over a 3 month period, but each time her fluid rapidly recurred. We placed a pleural catheter on 09/12/2014. She has been draining the catheter 3 times a week. Initially she would drain between 350 and 450 mL at a time. Over the past several drainages she is noted very little output (150 mL or less). She was in the hospital last week with what she understands was a virus. She says her breathing is much better since she's had the catheter in place.  Past Medical History  Diagnosis Date  . CKD (chronic kidney disease), stage IV     a. Baseline Cr reported 2.5-2.6.  Marland Kitchen Anxiety   . Diabetes mellitus     diabetic retinopathy  . Anemia   . Urinary tract infection   . Fall at home   . Colostomy care     colon ca - h/o bloody output from bag 12/2012 (ED visit)  . Colon cancer 30 year ago  . Chronic atrial fibrillation     a. not on anticoagulation due to increased risk of falls, advanced age.  . Chronic diastolic CHF (congestive heart failure)     a. Echo 10/2013: mild LVH, EF 60-65%, mod AS, mildly dilated LA, mod dilated RA, PASP 33.  Marland Kitchen Hypertension   . Carotid disease, bilateral     a. Carotid duplex 07/3612: 43-15% RICA/LICA.  Marland Kitchen H/O cardiovascular stress test     a. 2002: nuc negative for ischemia, EF 65%.  . Shortness of breath dyspnea   . Severe aortic stenosis 08/23/2014  . Heart murmur   . Depression     situational  . Headache   . Arthritis   . Recurrent pleural effusion on right        Current Outpatient Prescriptions  Medication Sig Dispense Refill  . acetaminophen (TYLENOL) 500 MG tablet Take 500 mg by mouth every 6 (six) hours as needed for mild pain.     Marland Kitchen  aspirin EC 81 MG tablet Take 81 mg by mouth at bedtime.     . calcitRIOL (ROCALTROL) 0.25 MCG capsule Take 0.25 mcg by mouth daily.     . carvedilol (COREG) 25 MG tablet Take 25 mg by mouth 2 (two) times daily.  3  . clotrimazole-betamethasone (LOTRISONE) lotion Apply 1 application topically daily as needed (apply to rash on stomach as needed).   0  . docusate sodium (COLACE) 100 MG capsule Take 100 mg by mouth daily as needed for constipation.     Marland Kitchen epoetin alfa (EPOGEN,PROCRIT) 40086 UNIT/ML injection 20,000 Units. Every two weeks    . ferrous sulfate 325 (65 FE) MG tablet Take 1 tablet (325 mg total) by mouth 2 (two) times daily with a meal. 60 tablet 0  . furosemide (LASIX) 80 MG tablet Take 1 tablet (80 mg total) by mouth 2 (two) times daily. 60 tablet 3  . HYDROcodone-acetaminophen (NORCO/VICODIN) 5-325 MG per tablet Take 1 tablet by mouth every 4 (four) hours as needed for moderate pain. 20 tablet 0  . insulin glargine (LANTUS) 100 UNIT/ML injection Inject 8 Units into the skin at  bedtime as needed (if cbg over 200).     Marland Kitchen linagliptin (TRADJENTA) 5 MG TABS tablet Take 5 mg by mouth daily.     Marland Kitchen LORazepam (ATIVAN) 0.5 MG tablet Take 0.5 mg by mouth 2 (two) times daily.    . meclizine (ANTIVERT) 12.5 MG tablet Take 12.5 mg by mouth as needed for dizziness.     . mirtazapine (REMERON) 7.5 MG tablet Take 7.5 mg by mouth at bedtime.     . promethazine (PHENERGAN) 12.5 MG tablet Take 1 tablet (12.5 mg total) by mouth every 6 (six) hours as needed for nausea or vomiting. 30 tablet 0  . saccharomyces boulardii (FLORASTOR) 250 MG capsule Take 1 capsule (250 mg total) by mouth 2 (two) times daily. 60 capsule 0  . simvastatin (ZOCOR) 20 MG tablet TAKE 1 TABLET (20 MG TOTAL) BY MOUTH AT BEDTIME. 30 tablet 5  . traMADol (ULTRAM) 50 MG tablet Take 1 tablet (50 mg total) by mouth every 6 (six) hours as needed for moderate pain. 30 tablet 0  . vancomycin (VANCOCIN HCL) 125 MG capsule Take 1 capsule (125 mg  total) by mouth 4 (four) times daily. 28 capsule 0  . vitamin B-12 (CYANOCOBALAMIN) 1000 MCG tablet Take 1,000 mcg by mouth daily.     No current facility-administered medications for this visit.    Physical Exam BP 142/73 mmHg  Pulse 71  Resp 20  Ht 5\' 3"  (1.6 m)  Wt 179 lb (81.194 kg)  BMI 31.72 kg/m2  SpO34 59% 81 year old woman in no acute distress Morbidly obese Alert and oriented 3 Catheter site incision clean dry and intact with no evidence of infection Lungs with equal breath sounds bilaterally  Diagnostic Tests: Chest x-ray was reviewed. There is improved aeration at the right base. There is some loculated fluid in the major fissure.   Impression: Mrs. Bohan is an 81 year old woman with a recurring right pleural effusion who had a pleural catheter placed about 3 weeks ago. She had a lot of drainage initially, but that has decreased markedly over the past week. Her symptoms are dramatically better with a pleural drainage. Her chest x-ray shows much better reexpansion of the lung than I was anticipating.   Plan: Change drainages to 1 time per week  I will see her back in 3 weeks with a repeat chest x-ray to check on her progress.  Melrose Nakayama, MD [facility] [phone]

## 2014-10-05 ENCOUNTER — Other Ambulatory Visit (HOSPITAL_COMMUNITY): Payer: Self-pay | Admitting: *Deleted

## 2014-10-06 ENCOUNTER — Ambulatory Visit (HOSPITAL_COMMUNITY)
Admission: RE | Admit: 2014-10-06 | Discharge: 2014-10-06 | Disposition: A | Discharge status: Home / Self Care | Payer: Medicare Other | Source: Ambulatory Visit | Attending: Nephrology | Admitting: Nephrology

## 2014-10-06 DIAGNOSIS — D509 Iron deficiency anemia, unspecified: Secondary | ICD-10-CM | POA: Diagnosis not present

## 2014-10-06 DIAGNOSIS — Z79899 Other long term (current) drug therapy: Secondary | ICD-10-CM | POA: Insufficient documentation

## 2014-10-06 DIAGNOSIS — D631 Anemia in chronic kidney disease: Secondary | ICD-10-CM | POA: Diagnosis not present

## 2014-10-06 DIAGNOSIS — Z5181 Encounter for therapeutic drug level monitoring: Secondary | ICD-10-CM | POA: Insufficient documentation

## 2014-10-06 DIAGNOSIS — N184 Chronic kidney disease, stage 4 (severe): Secondary | ICD-10-CM | POA: Diagnosis present

## 2014-10-06 LAB — POCT HEMOGLOBIN-HEMACUE: HEMOGLOBIN: 10.8 g/dL — AB (ref 12.0–15.0)

## 2014-10-06 MED ORDER — EPOETIN ALFA 20000 UNIT/ML IJ SOLN: 20000.0000 [IU] | INTRAMUSCULAR | Status: DC

## 2014-10-06 MED ORDER — EPOETIN ALFA 20000 UNIT/ML IJ SOLN
INTRAMUSCULAR | Status: AC
  Administered 2014-10-06: 20000 [IU] via SUBCUTANEOUS
  Filled 2014-10-06: qty 1

## 2014-10-06 MED ORDER — SODIUM CHLORIDE 0.9 % IV SOLN
510.0000 mg | Freq: Once | INTRAVENOUS | Status: AC
  Administered 2014-10-06: 510 mg via INTRAVENOUS
  Filled 2014-10-06: qty 17

## 2014-10-13 ENCOUNTER — Encounter: Payer: Self-pay | Admitting: Pulmonary Disease

## 2014-10-13 ENCOUNTER — Ambulatory Visit (INDEPENDENT_AMBULATORY_CARE_PROVIDER_SITE_OTHER): Payer: Medicare Other | Admitting: Pulmonary Disease

## 2014-10-13 VITALS — BP 126/70 | HR 72 | Wt 179.2 lb

## 2014-10-13 DIAGNOSIS — J948 Other specified pleural conditions: Secondary | ICD-10-CM | POA: Diagnosis not present

## 2014-10-13 DIAGNOSIS — I6523 Occlusion and stenosis of bilateral carotid arteries: Secondary | ICD-10-CM | POA: Diagnosis not present

## 2014-10-13 DIAGNOSIS — J90 Pleural effusion, not elsewhere classified: Secondary | ICD-10-CM

## 2014-10-13 NOTE — Patient Instructions (Signed)
Continue draining your Pleurx catheter as recommended by Dr. Roxan Hockey We will see you back in October with a chest x-ray let us know if you are having problems between now and then.

## 2014-10-13 NOTE — Progress Notes (Signed)
Subjective:    Patient ID: [name], female    DOB: [birth year], 81 y.o.   MRN: [id number]  Synopsis: Seen by [place] [facility] for the first time in 2016 after repeated hospitalizations. She was hospitalized in December 2015 with community-acquired pneumonia, this cleared up as of January 2016 with a chest x-ray which was normal. However in February 2016 she was admitted for cholecystitis and choledocholithiasis. She had an ERCP and biliary drain placed. She had a new pleural effusion at that time. By April 2016 she had 2 separate thoracenteses which showed a neutrophilic infiltrate, cultures were negative and cytology was negative.  HPI Chief Complaint  Patient presents with  . Follow-up    Pt states feeling well today. States SOB has resolved itself since last visit. Pt was admitted to hospital 09-22-14 for C-Diff.    She was hospitalized for C diff recently.  No better. Breathing has been much better lately.  NO problems breathing.  She was sent home with oxygen but not using it now. NO fevers or chills. Pleurex only draining once a week at  no chest pain. this point, barely any coming out.    Past Medical History  Diagnosis Date  . CKD (chronic kidney disease), stage IV     a. Baseline Cr reported 2.5-2.6.  Marland Kitchen Anxiety   . Diabetes mellitus     diabetic retinopathy  . Anemia   . Urinary tract infection   . Fall at home   . Colostomy care     colon ca - h/o bloody output from bag 12/2012 (ED visit)  . Colon cancer 30 year ago  . Chronic atrial fibrillation     a. not on anticoagulation due to increased risk of falls, advanced age.  . Chronic diastolic CHF (congestive heart failure)     a. Echo 10/2013: mild LVH, EF 60-65%, mod AS, mildly dilated LA, mod dilated RA, PASP 33.  Marland Kitchen Hypertension   . Carotid disease, bilateral     a. Carotid duplex 04/9474: 54-65% RICA/LICA.  Marland Kitchen H/O cardiovascular stress test     a. 2002: nuc negative for ischemia, EF 65%.  . Shortness of breath  dyspnea   . Severe aortic stenosis 08/23/2014  . Heart murmur   . Depression     situational  . Headache   . Arthritis   . Recurrent pleural effusion on right       Review of Systems  Constitutional: Negative for fever, chills and fatigue.  HENT: Negative for postnasal drip, rhinorrhea and sinus pressure.   Respiratory: Negative for shortness of breath and wheezing.   Cardiovascular: Negative for chest pain, palpitations and leg swelling.       Objective:   Physical Exam Filed Vitals:   10/13/14 1525  BP: 126/70  Pulse: 72  Weight: 179 lb 3.2 oz (81.285 kg)  SpO2: 97%   RA  Gen: chronically ill  appearing HENT: OP clear, TM's clear, neck supple PULM: Clear to auscultation on the right and left CV: RRR, no mgr, trace edema GI: BS+, soft, nontender Derm: no cyanosis or rash Psyche: normal mood and affect  CT abdomen from 09/22/2014 images personally reviewed, attention to lung images there is a small pocket of fluid between the right middle lobe and right lower lobe with an air-fluid level. Overall however a large right-sided pleural effusion has greatly improved   Notes from Dr. Leonarda Salon office were reviewed where he has been managing the drainage of her Pleurx catheter.  Assessment & Plan:  Pleural effusion, right This exudative effusion has nearly completely resolved with drainage with the Pleurx catheter. On my review of the CT abdomen from her recent hospital admission ICU where there is a small pocket of fluid in the fissure between the lobes and the right lung. This does not appear to be causing any problems and will likely be there for the rest of her life. I don't see a role for trying to drain that since she feels so well.  Plan: Continue Pleurx drainage as directed by thoracic surgery, I anticipate that they will want to remove the catheter on her visit in a couple weeks I will see her back in early October with a chest x-ray     Current  outpatient prescriptions:  .  acetaminophen (TYLENOL) 500 MG tablet, Take 500 mg by mouth every 6 (six) hours as needed for mild pain. , Disp: , Rfl:  .  aspirin EC 81 MG tablet, Take 81 mg by mouth at bedtime. , Disp: , Rfl:  .  calcitRIOL (ROCALTROL) 0.25 MCG capsule, Take 0.25 mcg by mouth daily. , Disp: , Rfl:  .  carvedilol (COREG) 25 MG tablet, Take 25 mg by mouth 2 (two) times daily., Disp: , Rfl: 3 .  clotrimazole-betamethasone (LOTRISONE) lotion, Apply 1 application topically daily as needed (apply to rash on stomach as needed). , Disp: , Rfl: 0 .  docusate sodium (COLACE) 100 MG capsule, Take 100 mg by mouth daily as needed for constipation. , Disp: , Rfl:  .  epoetin alfa (EPOGEN,PROCRIT) 78295 UNIT/ML injection, 20,000 Units. Every two weeks, Disp: , Rfl:  .  ferrous sulfate 325 (65 FE) MG tablet, Take 1 tablet (325 mg total) by mouth 2 (two) times daily with a meal., Disp: 60 tablet, Rfl: 0 .  furosemide (LASIX) 80 MG tablet, Take 1 tablet (80 mg total) by mouth 2 (two) times daily., Disp: 60 tablet, Rfl: 3 .  insulin glargine (LANTUS) 100 UNIT/ML injection, Inject 8 Units into the skin at bedtime as needed (if cbg over 200). , Disp: , Rfl:  .  linagliptin (TRADJENTA) 5 MG TABS tablet, Take 5 mg by mouth daily. , Disp: , Rfl:  .  LORazepam (ATIVAN) 0.5 MG tablet, Take 0.5 mg by mouth 2 (two) times daily., Disp: , Rfl:  .  meclizine (ANTIVERT) 12.5 MG tablet, Take 12.5 mg by mouth as needed for dizziness. , Disp: , Rfl:  .  mirtazapine (REMERON) 7.5 MG tablet, Take 7.5 mg by mouth at bedtime. , Disp: , Rfl:  .  promethazine (PHENERGAN) 12.5 MG tablet, Take 1 tablet (12.5 mg total) by mouth every 6 (six) hours as needed for nausea or vomiting., Disp: 30 tablet, Rfl: 0 .  saccharomyces boulardii (FLORASTOR) 250 MG capsule, Take 1 capsule (250 mg total) by mouth 2 (two) times daily., Disp: 60 capsule, Rfl: 0 .  simvastatin (ZOCOR) 20 MG tablet, TAKE 1 TABLET (20 MG TOTAL) BY MOUTH AT  BEDTIME., Disp: 30 tablet, Rfl: 5 .  traMADol (ULTRAM) 50 MG tablet, Take 1 tablet (50 mg total) by mouth every 6 (six) hours as needed for moderate pain., Disp: 30 tablet, Rfl: 0 .  vitamin B-12 (CYANOCOBALAMIN) 1000 MCG tablet, Take 1,000 mcg by mouth daily., Disp: , Rfl:  .  HYDROcodone-acetaminophen (NORCO/VICODIN) 5-325 MG per tablet, Take 1 tablet by mouth every 4 (four) hours as needed for moderate pain. (Patient not taking: Reported on 10/13/2014), Disp: 20 tablet, Rfl: 0 .  vancomycin (VANCOCIN HCL) 125 MG capsule, Take 1 capsule (125 mg total) by mouth 4 (four) times daily. (Patient not taking: Reported on 10/13/2014), Disp: 28 capsule, Rfl: 0

## 2014-10-13 NOTE — Assessment & Plan Note (Signed)
This exudative effusion has nearly completely resolved with drainage with the Pleurx catheter. On my review of the CT abdomen from her recent hospital admission ICU where there is a small pocket of fluid in the fissure between the lobes and the right lung. This does not appear to be causing any problems and will likely be there for the rest of her life. I don't see a role for trying to drain that since she feels so well.  Plan: Continue Pleurx drainage as directed by thoracic surgery, I anticipate that they will want to remove the catheter on her visit in a couple weeks I will see her back in early October with a chest x-ray

## 2014-10-16 ENCOUNTER — Institutional Professional Consult (permissible substitution) (INDEPENDENT_AMBULATORY_CARE_PROVIDER_SITE_OTHER): Payer: Medicare Other | Admitting: Thoracic Surgery (Cardiothoracic Vascular Surgery)

## 2014-10-16 ENCOUNTER — Encounter: Payer: Self-pay | Admitting: Thoracic Surgery (Cardiothoracic Vascular Surgery)

## 2014-10-16 VITALS — BP 150/73 | HR 67 | Resp 20 | Ht 63.0 in | Wt 167.0 lb

## 2014-10-16 DIAGNOSIS — I350 Nonrheumatic aortic (valve) stenosis: Secondary | ICD-10-CM

## 2014-10-16 DIAGNOSIS — J90 Pleural effusion, not elsewhere classified: Secondary | ICD-10-CM

## 2014-10-16 DIAGNOSIS — I6523 Occlusion and stenosis of bilateral carotid arteries: Secondary | ICD-10-CM | POA: Diagnosis not present

## 2014-10-16 DIAGNOSIS — J948 Other specified pleural conditions: Secondary | ICD-10-CM

## 2014-10-16 DIAGNOSIS — I5032 Chronic diastolic (congestive) heart failure: Secondary | ICD-10-CM

## 2014-10-16 NOTE — Patient Instructions (Addendum)
Continue to follow up with Dr Radford Pax and Dr Moshe Cipro for management of aortic stenosis, hypertension and chronic diastolic congestive heart failure  Schedule follow up appointment with [facility] regarding possible elective cholecystectomy

## 2014-10-16 NOTE — Progress Notes (Signed)
[name] REPORT  Referring Provider is [name], MD Primary Cardiologist is [name], MD PCP is FRIED, [name], MD  Chief Complaint  Patient presents with  . Aortic Stenosis    Surgical eval for possible TAVR, saw [name] 09/20/14    HPI:  Patient is an 81 year old female with complex past medical history including known history of aortic stenosis, chronic diastolic congestive heart failure, chronic persistent atrial fibrillation, stage IV chronic kidney disease, hypertension, chronic anemia, and recent complex illness due to choledochocholelithiasis with ascending cholangitis and acute cholecystitis complicated by right pleural effusion requiring Pleurx catheter placement who has been referred to discuss treatment options for management of aortic stenosis. The patient has had known history of aortic stenosis with normal left ventricular systolic function for which she has been followed for several years by [name]. She has chronic persistent atrial fibrillation that is treated with rate control. The patient is anticoagulated using only aspirin and felt not to be a candidate for anticoagulation using warfarin or NOAC because of concerns regarding the possibility of risk of falls.  She was seen in follow-up by [name] in February of this year at which time the patient reportedly was doing well and denied symptoms of exertional shortness of breath. Transthoracic echocardiogram performed at that time demonstrated the presence of severe aortic stenosis with peak velocity across the aortic valve measured 3.8 m/s corresponding to a mean transvalvular gradient of 31 mmHg. Left ventricular systolic function was preserved with ejection fraction estimated 65%. Shortly after that the patient became acutely ill and was hospitalized with nausea vomiting and right-sided chest and abdominal  pain. She was initially diagnosed with acalculous cholecystitis and a percutaneous cholecystostomy tube was placed. However, follow-up cholangiogram confirmed the presence of an obstructing gallstone in the distal common bile duct, confirming the diagnosis of choledochocholelithiasis with calculus gallstone disease and ascending cholangitis.  The patient was treated with antibiotics and discharged from the hospital, and subsequently underwent ERCP with sphincterotomy and stone extraction on 2 occasions by Dr. Deatra Ina, initially on 07/11/2014. She developed acute hypoxic respiratory failure and was hospitalized from 07/14/2014 to 07/24/2014.  She developed an acute exudative right pleural effusion during that hospitalization that required thoracentesis on multiple occasions.  She developed acute exacerbation of chronic diastolic congestive heart failure with transient acute renal failure. Ultimately she was discharged from the hospital but her right pleural effusion reaccumulated despite thoracentesis on 3 separate occasions.  She eventually was referred for thoracic surgical consultation and underwent Pleurx catheter placement by Dr. Roxan Hockey on 09/12/2014.  Since then the patient's Pleurx catheter drainage gradually tapered and eventually stopped completely. The right lung has been noted to be partially reexpanded on follow-up chest radiograph with likely some degree of chronic trapped lung with residual atelectasis.  The patient has been seen in follow-up by Dr. Roxan Hockey who tentatively plans to remove the Pleurx catheter next month as long as the patient's x-ray remains stable and there is no longer any drainage coming from the catheter.  Follow-up transthoracic echocardiogram performed 08/23/2014 confirmed the presence of persistent severe aortic stenosis with peak velocity measured 3.9 m/s corresponding to mean transvalvular gradient of 34 mmHg. Left ventricular systolic function remains normal with  ejection fraction estimated 65%. The patient was seen in follow up by [name] and referred to [name] for consultation.  The patient has subsequently been referred for surgical consultation to  discuss treatment options for management of severe aortic stenosis.  The patient is widowed and previously lived alone in [place]. Since her acute illness this spring she has been living with one of her daughters.  She had been physically active and functionally independent until she developed her acute illness beginning in February of this year. She walks using a walker because of instability of gait with a tendency to stumble and fall. She describes mild chronic pain in her lower back related to degenerative arthritis. Presently the patient states that she finally feels fairly good. Both she and her daughter state that she currently is doing better than she has since she became acutely ill last February. She still has stable symptoms of mild exertional shortness of breath, but the patient claims that her breathing doesn't bother her at all presently. She denies any symptoms resting shortness of breath, PND, orthopnea, dizzy spells, or syncope. She has stable chronic bilateral lower extremity edema. She's never had any chest pain or chest tightness. She denies any recent fevers or chills. She denies any pleuritic chest discomfort. Her Pleurx catheter has not been draining.    Past Medical History  Diagnosis Date  . CKD (chronic kidney disease), stage IV     a. Baseline Cr reported 2.5-2.6.  Marland Kitchen Anxiety   . Diabetes mellitus     diabetic retinopathy  . Anemia   . Urinary tract infection   . Fall at home   . Colostomy care     colon ca - h/o bloody output from bag 12/2012 (ED visit)  . Colon cancer 30 year ago  . Chronic atrial fibrillation     a. not on anticoagulation due to increased risk of falls, advanced age.  . Chronic diastolic CHF (congestive heart failure)     a. Echo 10/2013: mild LVH, EF  60-65%, mod AS, mildly dilated LA, mod dilated RA, PASP 33.  Marland Kitchen Hypertension   . Carotid disease, bilateral     a. Carotid duplex 10/6759: 95-09% RICA/LICA.  Marland Kitchen H/O cardiovascular stress test     a. 2002: nuc negative for ischemia, EF 65%.  . Shortness of breath dyspnea   . Severe aortic stenosis 08/23/2014  . Heart murmur   . Depression     situational  . Headache   . Arthritis   . Recurrent pleural effusion on right   . Chronic kidney disease (CKD), stage IV (severe) 04/27/2012  . Choledocholithiasis with acute cholecystitis 06/14/2014  . Clostridium difficile diarrhea   . Type II diabetes mellitus with complication 06/17/7122    Past Surgical History  Procedure Laterality Date  . Abdominal surgery    . Abdominal hysterectomy    . Colon surgery    . Appendectomy    . Colostomy    . Cardioversion  09/30/2011    Procedure: CARDIOVERSION;  Surgeon: [name], MD;  Location: [facility];  Service: Cardiovascular;  Laterality: N/A;  . I&d extremity  11/20/2011    Procedure: IRRIGATION AND DEBRIDEMENT EXTREMITY;  Surgeon: Alta Corning, MD;  Location: [facility];  Service: Orthopedics;  Laterality: Left;  . Av fistula placement Left 06/02/2012    Procedure: ARTERIOVENOUS (AV) FISTULA CREATION;  Surgeon: Conrad Hato Candal, MD;  Location: [facility];  Service: Vascular;  Laterality: Left;  Bascilic Fistula  . Bascilic vein transposition Left 08/18/2012    Procedure: BASCILIC VEIN TRANSPOSITION;  Surgeon: Conrad Vernonia, MD;  Location: [facility];  Service: Vascular;  Laterality: Left;  Left 2nd stage Basilic  Vein Transposition   . Ercp N/A 07/11/2014    Procedure: ENDOSCOPIC RETROGRADE CHOLANGIOPANCREATOGRAPHY (ERCP);  Surgeon: Inda Castle, MD;  Location: [facility];  Service: Endoscopy;  Laterality: N/A;  . Biliary stent placement N/A 07/11/2014    Procedure: BILIARY STENT PLACEMENT;  Surgeon: Inda Castle, MD;  Location: [facility];  Service: Endoscopy;  Laterality: N/A;  . Ercp N/A 07/21/2014    Procedure:  ENDOSCOPIC RETROGRADE CHOLANGIOPANCREATOGRAPHY (ERCP);  Surgeon: Inda Castle, MD;  Location: [facility];  Service: Endoscopy;  Laterality: N/A;  . Fracture surgery      Right leg  . Chest tube insertion Right 09/12/2014    Procedure: INSERTION PLEURAL DRAINAGE CATHETER;  Surgeon: Melrose Nakayama, MD;  Location: [facility];  Service: Thoracic;  Laterality: Right;  . Ct perc cholecystostomy  05/11/2014    Family History  Problem Relation Age of Onset  . Heart disease Father   . Diabetes Sister   . Diabetes Daughter   . Hyperlipidemia Daughter   . Hypertension Daughter   . Other Daughter     varicose veins  . Cancer Son   . Diabetes Son   . Heart disease Son     before age 17  . Hyperlipidemia Son   . Hypertension Son   . Heart attack Father   . Stroke Father     History   Social History  . Marital Status: Widowed    Spouse Name: N/A  . Number of Children: N/A  . Years of Education: N/A   Occupational History  . Not on file.   Social History Main Topics  . Smoking status: Never Smoker   . Smokeless tobacco: Current User    Types: Chew  . Alcohol Use: No  . Drug Use: No  . Sexual Activity: No   Other Topics Concern  . Not on file   Social History Narrative    Current Outpatient Prescriptions  Medication Sig Dispense Refill  . acetaminophen (TYLENOL) 500 MG tablet Take 500 mg by mouth every 6 (six) hours as needed for mild pain.     Marland Kitchen aspirin EC 81 MG tablet Take 81 mg by mouth at bedtime.     . calcitRIOL (ROCALTROL) 0.25 MCG capsule Take 0.25 mcg by mouth daily.     . carvedilol (COREG) 25 MG tablet Take 25 mg by mouth 2 (two) times daily.  3  . clotrimazole-betamethasone (LOTRISONE) lotion Apply 1 application topically daily as needed (apply to rash on stomach as needed).   0  . docusate sodium (COLACE) 100 MG capsule Take 100 mg by mouth daily as needed for constipation.     Marland Kitchen epoetin alfa (EPOGEN,PROCRIT) 63875 UNIT/ML injection 20,000 Units. Every two  weeks    . ferrous sulfate 325 (65 FE) MG tablet Take 1 tablet (325 mg total) by mouth 2 (two) times daily with a meal. 60 tablet 0  . furosemide (LASIX) 80 MG tablet Take 1 tablet (80 mg total) by mouth 2 (two) times daily. 60 tablet 3  . HYDROcodone-acetaminophen (NORCO/VICODIN) 5-325 MG per tablet Take 1 tablet by mouth every 4 (four) hours as needed for moderate pain. 20 tablet 0  . insulin glargine (LANTUS) 100 UNIT/ML injection Inject 8 Units into the skin at bedtime as needed (if cbg over 200).     Marland Kitchen linagliptin (TRADJENTA) 5 MG TABS tablet Take 5 mg by mouth daily.     Marland Kitchen LORazepam (ATIVAN) 0.5 MG tablet Take 0.5 mg by mouth 2 (two)  times daily.    . meclizine (ANTIVERT) 12.5 MG tablet Take 12.5 mg by mouth as needed for dizziness.     . mirtazapine (REMERON) 7.5 MG tablet Take 7.5 mg by mouth at bedtime.     . promethazine (PHENERGAN) 12.5 MG tablet Take 1 tablet (12.5 mg total) by mouth every 6 (six) hours as needed for nausea or vomiting. 30 tablet 0  . saccharomyces boulardii (FLORASTOR) 250 MG capsule Take 1 capsule (250 mg total) by mouth 2 (two) times daily. 60 capsule 0  . simvastatin (ZOCOR) 20 MG tablet TAKE 1 TABLET (20 MG TOTAL) BY MOUTH AT BEDTIME. 30 tablet 5  . traMADol (ULTRAM) 50 MG tablet Take 1 tablet (50 mg total) by mouth every 6 (six) hours as needed for moderate pain. 30 tablet 0  . vitamin B-12 (CYANOCOBALAMIN) 1000 MCG tablet Take 1,000 mcg by mouth daily.     No current facility-administered medications for this visit.    Allergies  Allergen Reactions  . Levaquin [Levofloxacin] Nausea Only  . Codeine Nausea Only  . Keflet [Cephalexin] Nausea Only  . Morphine And Related Nausea Only  . Oxycodone Other (See Comments)    Abnormal behavior      Review of Systems:   General:  decreased appetite, decreased energy, no weight gain, no weight loss, no fever  Cardiac:  no chest pain with exertion, no chest pain at rest, + SOB with exertion, no resting SOB, no  PND, no orthopnea, no palpitations, + arrhythmia, + atrial fibrillation, + LE edema, no dizzy spells, no syncope  Respiratory:  + shortness of breath, no home oxygen, no productive cough, no dry cough, no bronchitis, no wheezing, no hemoptysis, no asthma, no pain with inspiration or cough, no sleep apnea, no CPAP at night  GI:   no difficulty swallowing, no reflux, no frequent heartburn, no hiatal hernia, no abdominal pain, no constipation, no diarrhea, no hematochezia, no hematemesis, no melena, chronic parastomal hernia around colostomy - no h/o incarceration  GU:   + dysuria,  + frequency, + urinary tract infection, no hematuria, no kidney stones, + stage IV chronic kidney disease  Vascular:  no pain suggestive of claudication, no pain in feet, no leg cramps, no varicose veins, no DVT,   Neuro:   no stroke, no TIA's, no seizures, no headaches, no temporary blindness one eye,  no slurred speech, no peripheral neuropathy, no chronic pain, + instability of gait, no memory/cognitive dysfunction  Musculoskeletal: + arthritis, no joint swelling, no myalgias, + mild difficulty walking, decreased mobility   Skin:   no rash, no itching, no skin infections, + pressure sores or ulcerations on buttocks  Psych:   no anxiety, + depression, no nervousness, no unusual recent stress  Eyes:   no blurry vision, no floaters, no recent vision changes, does not wear glasses or contacts  ENT:   no hearing loss, no loose or painful teeth, + dentures, last saw dentist several years ago  Hematologic:  + easy bruising, no abnormal bleeding, no clotting disorder, no frequent epistaxis  Endocrine:  + diabetes, does check CBG's at home           Physical Exam:   BP 150/73 mmHg  Pulse 67  Resp 20  Ht 5\' 3"  (1.6 m)  Wt 167 lb (75.751 kg)  BMI 29.59 kg/m2  SpO2 96%  General:  Obese, elderly,  NAD  HEENT:  Unremarkable   Neck:   no JVD, no bruits, no adenopathy  Chest:   clear to auscultation, symmetrical breath  sounds except slightly diminished right base, no wheezes, no rhonchi, Pleur-X catheter in place  CV:   RRR, grade III/VI crescendo/decrescendo murmur heard best at sternal border,  no diastolic murmur  Abdomen:  Obese, soft, non-tender, no masses, large hernia palpable around colostomy left side  Extremities:  warm, well-perfused, pulses not palpable, + bilateral LE edema  Rectal/GU  Deferred  Neuro:   Grossly non-focal and symmetrical throughout  Skin:   Clean and dry, no rashes, no breakdown   Diagnostic Tests:  Transthoracic Echocardiography  Patient:  [name], [name] MR #:    [id number] Study Date: 08/23/2014 Gender:   F Age:    46 Height:   160 cm Weight:   78.5 kg BSA:    1.9 m^2 Pt. Status: Room:  ORDERING   Fransico Him, MD REFERRING  Fransico Him, MD SONOGRAPHER Oletta Lamas, Will ATTENDING  Sanda Klein, MD PERFORMING  [facility] Outpatient  cc:  ------------------------------------------------------------------- LV EF: 65% -  70%  ------------------------------------------------------------------- Indications:   (I35.0).  ------------------------------------------------------------------- History:  PMH: LE edema. Dilated aortic root. Acquired from the patient and from the patient&'s chart. Atrial fibrillation. Congestive heart failure. Aortic valve disease. Primary pulmonary hypertension. Risk factors: Hypertension. Diabetes mellitus.  ------------------------------------------------------------------- Study Conclusions  - Left ventricle: The cavity size was normal. There was moderate concentric hypertrophy. Systolic function was vigorous. The estimated ejection fraction was in the range of 65% to 70%. Wall motion was normal; there were no regional wall motion abnormalities. - Aortic valve: Valve mobility was severely restricted. There was severe stenosis. - Mitral valve: Calcified annulus. - Left atrium: The  atrium was mildly dilated. - Pulmonary arteries: Systolic pressure was mildly increased. PA peak pressure: 34 mm Hg (S). - Pericardium, extracardiac: A trivial pericardial effusion was identified posterior to the heart.  Transthoracic echocardiography. M-mode, complete 2D, spectral Doppler, and color Doppler. Birthdate: Patient birthdate: [birth year]. Age: Patient is 81 yr old. Sex: Gender: female. BMI: 30.6 kg/m^2. Blood pressure:   146/78 Patient status: Outpatient. Study date: Study date: 08/23/2014. Study time: 01:54 PM. Location: [facility]  -------------------------------------------------------------------  ------------------------------------------------------------------- Left ventricle: The cavity size was normal. There was moderate concentric hypertrophy. Systolic function was vigorous. The estimated ejection fraction was in the range of 65% to 70%. Wall motion was normal; there were no regional wall motion abnormalities. The study was not technically sufficient to allow evaluation of LV diastolic dysfunction due to atrial fibrillation.  ------------------------------------------------------------------- Aortic valve:  Trileaflet; moderately thickened, moderately calcified leaflets. Valve mobility was severely restricted. Doppler:  There was severe stenosis.  There was no regurgitation.  VTI ratio of LVOT to aortic valve: 0.24. Valve area (VTI): 0.88 cm^2. Indexed valve area (VTI): 0.46 cm^2/m^2. Peak velocity ratio of LVOT to aortic valve: 0.21. Valve area (Vmax): 0.77 cm^2. Indexed valve area (Vmax): 0.41 cm^2/m^2. Mean velocity ratio of LVOT to aortic valve: 0.2. Valve area (Vmean): 0.72 cm^2. Indexed valve area (Vmean): 0.38 cm^2/m^2.  Mean gradient (S): 34 mm Hg. Peak gradient (S): 61 mm Hg.  ------------------------------------------------------------------- Aorta: The aorta was poorly visualized. Aortic root: The aortic root  was normal in size. Ascending aorta: The ascending aorta was normal in size.  ------------------------------------------------------------------- Mitral valve:  Calcified annulus. Leaflet separation was normal. Doppler: Transvalvular velocity was within the normal range. There was no evidence for stenosis. There was no regurgitation.  Peak gradient (D): 10 mm Hg.  ------------------------------------------------------------------- Left atrium: The atrium was mildly dilated.  ------------------------------------------------------------------- Right  ventricle: The cavity size was normal. Wall thickness was normal. Systolic function was normal.  ------------------------------------------------------------------- Pulmonic valve:  Poorly visualized. The valve appears to be grossly normal.  Doppler: There was mild regurgitation.  ------------------------------------------------------------------- Tricuspid valve:  Structurally normal valve.  Leaflet separation was normal. Doppler: Transvalvular velocity was within the normal range. There was mild regurgitation.  ------------------------------------------------------------------- Pulmonary artery:  Systolic pressure was mildly increased.  ------------------------------------------------------------------- Right atrium: The atrium was normal in size.  ------------------------------------------------------------------- Pericardium: A trivial pericardial effusion was identified posterior to the heart.  ------------------------------------------------------------------- Systemic veins: Inferior vena cava: The vessel was normal in size. The respirophasic diameter changes were in the normal range (>= 50%), consistent with normal central venous pressure.  ------------------------------------------------------------------- Post procedure conclusions Ascending Aorta:  - The aorta was poorly  visualized.  ------------------------------------------------------------------- Measurements  Left ventricle              Value     Reference LV ID, ED, PLAX chordal      (L)   33.4 mm    43 - 52 LV ID, ES, PLAX chordal          24  mm    23 - 38 LV fx shortening, PLAX chordal  (L)   28  %    >=29 LV PW thickness, ED            13  mm    --------- IVS/LV PW ratio, ED            1.16      <=1.3 Stroke volume, 2D             50  ml    --------- Stroke volume/bsa, 2D           26  ml/m^2  ---------  Ventricular septum            Value     Reference IVS thickness, ED             15.1 mm    ---------  LVOT                   Value     Reference LVOT ID, S                21.6 mm    --------- LVOT area                 3.66 cm^2   --------- LVOT ID                  18  mm    --------- LVOT peak velocity, S           81.94 cm/s   --------- LVOT mean velocity, S           53.5 cm/s   --------- LVOT VTI, S                22.5 cm    --------- Stroke volume (SV), LVOT DP        82.5 ml    --------- Stroke index (SV/bsa), LVOT DP      43.5 ml/m^2  ---------  Aortic valve               Value     Reference Aortic valve peak velocity, S       390  cm/s   --------- Aortic valve mean velocity, S       271  cm/s   --------- Aortic valve VTI,  S            94.5 cm    --------- Aortic mean gradient, S          34  mm Hg  --------- Aortic peak gradient, S          61  mm Hg  --------- VTI ratio, LVOT/AV            0.24      --------- Aortic valve area, VTI          0.88 cm^2    --------- Aortic valve area/bsa, VTI        0.46 cm^2/m^2 --------- Velocity ratio, peak, LVOT/AV       0.21      --------- Aortic valve area, peak velocity     0.77 cm^2   --------- Aortic valve area/bsa, peak        0.41 cm^2/m^2 --------- velocity Velocity ratio, mean, LVOT/AV       0.2      --------- Aortic valve area, mean velocity     0.72 cm^2   --------- Aortic valve area/bsa, mean        0.38 cm^2/m^2 --------- velocity  Aorta                   Value     Reference Aortic root ID, ED            33  mm    --------- Ascending aorta ID, A-P, S        39  mm    ---------  Left atrium                Value     Reference LA ID, A-P, ES              39  mm    --------- LA ID/bsa, A-P              2.06 cm/m^2  <=2.2 LA volume, ES, 1-p A4C          64  ml    --------- LA volume/bsa, ES, 1-p A4C        33.8 ml/m^2  ---------  Mitral valve               Value     Reference Mitral E-wave peak velocity        160  cm/s   --------- Mitral deceleration time         208  ms    150 - 230 Mitral peak gradient, D          10  mm Hg  ---------  Pulmonary arteries            Value     Reference PA pressure, S, DP        (H)   34  mm Hg  <=30  Tricuspid valve              Value     Reference Tricuspid regurg peak velocity      279  cm/s   --------- Tricuspid peak RV-RA gradient       31  mm Hg  ---------  Systemic veins              Value     Reference Estimated CVP               3   mm Hg  ---------  Right ventricle  Value     Reference RV pressure, S, DP        (H)    34  mm Hg  <=30  Legend: (L) and (H) mark values outside specified reference range.  ------------------------------------------------------------------- Prepared and Electronically Authenticated by  Sanda Klein, MD 2016-06-01T17:00:37   Transthoracic Echocardiography  Patient:  Amarissa, Koerner MR #:    [id number] Study Date: 05/13/2014 Gender:   F Age:    66 Height:   160 cm Weight:   83 kg BSA:    1.95 m^2 Pt. Status: Room:    6N06C  ADMITTING  Sharion Settler [id number] Etta Grandchild [id number] Clayborn Bigness [id number] PERFORMING  [facility], Inpatient SONOGRAPHER Minus Breeding  cc:  ------------------------------------------------------------------- LV EF: 65% -  70%  ------------------------------------------------------------------- Indications:   CHF - 428.0.  ------------------------------------------------------------------- History:  PMH: Diastolic Dysfunction. Community Acquired Pneumonia. Thrombocytopenia. Dilated Aortic Root. Atrial fibrillation. Aortic valve disease. Risk factors: Diabetes mellitus.  ------------------------------------------------------------------- Study Conclusions  - Left ventricle: E/e&'> 23 suggestive of increased LV filling The cavity size was normal. There was moderate focal basal and mild concentric hypertrophy. Systolic function was vigorous. The estimated ejection fraction was in the range of 65% to 70%. Wall motion was normal; there were no regional wall motion abnormalities. - Aortic valve: Severe diffuse thickening and calcification. Valve mobility was restricted. There was moderate to severe stenosis. Valve area (VTI): 0.86 cm^2. Valve area (Vmax): 0.84 cm^2. Valve area (Vmean): 0.8 cm^2. - Mitral valve: Calcified annulus. Moderate focal calcification of the anterior leaflet. There was mild regurgitation. - Left atrium:  The atrium was mildly dilated. - Pulmonic valve: There was trivial regurgitation. - Pulmonary arteries: PA peak pressure: 60 mm Hg (S).  Impressions:  - The right ventricular systolic pressure was increased consistent with moderate pulmonary hypertension.  Transthoracic echocardiography. M-mode, complete 2D, spectral Doppler, and color Doppler. Birthdate: Patient birthdate: Aug 01, 1925. Age: Patient is 81 yr old. Sex: Gender: female. BMI: 32.4 kg/m^2. Blood pressure:   168/70 Patient status: Inpatient. Study date: Study date: 05/13/2014. Study time: 02:52 PM. Location: Bedside.  -------------------------------------------------------------------  ------------------------------------------------------------------- Left ventricle: E/e&'> 23 suggestive of increased LV filling The cavity size was normal. There was moderate focal basal and mild concentric hypertrophy. Systolic function was vigorous. The estimated ejection fraction was in the range of 65% to 70%. Wall motion was normal; there were no regional wall motion abnormalities. Normal sinus rhythm was absent. The study was not technically sufficient to allow evaluation of LV diastolic dysfunction due to atrial fibrillation.  ------------------------------------------------------------------- Aortic valve:  Trileaflet. Severe diffuse thickening and calcification. Valve mobility was restricted. Doppler:  There was moderate to severe stenosis.  There was no regurgitation.  VTI ratio of LVOT to aortic valve: 0.27. Valve area (VTI): 0.86 cm^2. Indexed valve area (VTI): 0.44 cm^2/m^2. Valve area (Vmax): 0.84 cm^2. Indexed valve area (Vmax): 0.43 cm^2/m^2. Mean velocity ratio of LVOT to aortic valve: 0.25. Valve area (Vmean): 0.8 cm^2. Indexed valve area (Vmean): 0.41 cm^2/m^2.  Mean gradient (S): 31 mm Hg. Peak gradient (S): 54 mm  Hg.  ------------------------------------------------------------------- Aorta: Aortic root: The aortic root was normal in size.  ------------------------------------------------------------------- Mitral valve:  Calcified annulus. Moderate focal calcification of the anterior leaflet. Mobility was not restricted. Doppler: Transvalvular velocity was within the normal range. There was no evidence for stenosis. There was mild regurgitation.  Peak gradient (D): 10 mm Hg.  ------------------------------------------------------------------- Left atrium: The atrium was mildly dilated.  ------------------------------------------------------------------- Right  ventricle: The cavity size was normal. Wall thickness was normal. Systolic function was normal.  ------------------------------------------------------------------- Pulmonic valve:  Structurally normal valve.  Cusp separation was normal. Doppler: Transvalvular velocity was within the normal range. There was no evidence for stenosis. There was trivial regurgitation.  ------------------------------------------------------------------- Tricuspid valve:  Structurally normal valve.  Doppler: Transvalvular velocity was within the normal range. There was mild regurgitation.  ------------------------------------------------------------------- Pulmonary artery:  The main pulmonary artery was normal-sized. Systolic pressure was within the normal range.  ------------------------------------------------------------------- Right atrium: The atrium was normal in size.  ------------------------------------------------------------------- Pericardium: There was no pericardial effusion.  ------------------------------------------------------------------- Systemic veins: Inferior vena cava: The vessel was mildly dilated.  ------------------------------------------------------------------- Measurements  Left ventricle               Value     Reference LV ID, ED, PLAX chordal      (L)   33.1 mm    43 - 52 LV ID, ES, PLAX chordal          24.8 mm    23 - 38 LV fx shortening, PLAX chordal  (L)   25  %    >=29 LV PW thickness, ED            13.8 mm    --------- IVS/LV PW ratio, ED            1.14      <=1.3 Stroke volume, 2D             71  ml    --------- Stroke volume/bsa, 2D           36  ml/m^2  --------- LV e&', lateral              6.74 cm/s   --------- LV E/e&', lateral             23       --------- LV e&', medial               5.11 cm/s   --------- LV E/e&', medial              30.33     --------- LV e&', average              5.93 cm/s   --------- LV E/e&', average             26.16     ---------  Ventricular septum            Value     Reference IVS thickness, ED             15.8 mm    ---------  LVOT                   Value     Reference LVOT ID, S                20  mm    --------- LVOT area                 3.14 cm^2   --------- LVOT peak velocity, S           98.8 cm/s   --------- LVOT mean velocity, S           67.3 cm/s   --------- LVOT VTI, S                22.5 cm    --------- LVOT peak gradient, S  4   mm Hg  ---------  Aortic valve               Value     Reference Aortic valve mean velocity, S       264  cm/s   --------- Aortic valve VTI, S            82.3 cm    --------- Aortic mean gradient, S          31  mm Hg  --------- Aortic peak gradient, S          54  mm Hg  --------- VTI ratio, LVOT/AV             0.27      --------- Aortic valve area, VTI          0.86 cm^2   --------- Aortic valve area/bsa, VTI        0.44 cm^2/m^2 --------- Aortic valve area, peak velocity     0.84 cm^2   --------- Aortic valve area/bsa, peak        0.43 cm^2/m^2 --------- velocity Velocity ratio, mean, LVOT/AV       0.25      --------- Aortic valve area, mean velocity     0.8  cm^2   --------- Aortic valve area/bsa, mean        0.41 cm^2/m^2 --------- velocity  Aorta                   Value     Reference Aortic root ID, ED            31  mm    ---------  Left atrium                Value     Reference LA ID, A-P, ES              50  mm    --------- LA ID/bsa, A-P          (H)   2.56 cm/m^2  <=2.2 LA volume, ES, 1-p A4C          66.8 ml    --------- LA volume/bsa, ES, 1-p A4C        34.2 ml/m^2  --------- LA volume, ES, 1-p A2C          64.2 ml    --------- LA volume/bsa, ES, 1-p A2C        32.9 ml/m^2  ---------  Mitral valve               Value     Reference Mitral E-wave peak velocity        155  cm/s   --------- Mitral deceleration time         187  ms    150 - 230 Mitral peak gradient, D          10  mm Hg  ---------  Pulmonary arteries            Value     Reference PA pressure, S, DP        (H)   60  mm Hg  <=30  Systemic veins              Value     Reference Estimated CVP               8   mm Hg  ---------  Right ventricle  Value     Reference RV s&', lateral, S             9.68 cm/s   ---------  Legend: (L) and (H) mark values outside specified reference  range.  ------------------------------------------------------------------- Prepared and Electronically Authenticated by  Fransico Him, MD 2016-02-20T17:03:03    STS Risk Calculator  Procedure    AVR  Risk of Mortality   13.6% Morbidity or Mortality  45.1% Prolonged LOS   28.5% Short LOS    6.0% Permanent Stroke   4.4% Prolonged Vent Support  35.8% DSW Infection    0.5% Renal Failure    28.6% Reoperation    13.0%    Impression:  The patient has known history of aortic stenosis that has gradually progressed in severity over the last several years by serial transthoracic echocardiogram.  I have personally reviewed the patient's most recent echocardiogram performed 08/23/2014 and agree that her aortic stenosis is now probably severe. There is severe thickening with moderate calcification and restricted leaflet mobility involving all 3 leaflets of the patient's aortic valve. Peak velocity across the aortic valve measured as high as 3.9 m/s corresponding to mean transvalvular gradient estimated 34 mmHg. The dimensionless velocity ratio of LVOT to aortic valve measured 0.21.  Left ventricular systolic function remains normal with ejection fraction estimated 65%.  At present the patient reports very mild symptoms of exertional shortness of breath consistent with chronic diastolic congestive heart failure, New York Heart Association functional class II.  I suspect that the presence of aortic stenosis has significantly impacted this patient's clinical condition over the last few months, but the initiating circumstances were all completely unrelated and stemmed from the development of complications related to choledocholithiasis with acute cholecystitis and cholangitis. The patient's right pleural effusion was likely initially related to her gallbladder disease, although the development of an acute exacerbation of chronic diastolic congestive heart failure may have fueled the prolonged drainage  of pleural fluid. The patient admits to long standing symptoms of exertional shortness of breath consistent with chronic diastolic congestive heart failure, but at present she states that her breathing is better than it has been ever since she became acutely ill last February.  Her Pleurx catheter has quit draining fluid and her most recent chest x-ray did not reveal reaccumulation.  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  The patient's renal function appears to have stabilized with serum creatinine ranging between 2.5 and 3.2.  Although she has seemingly recovered from her initial acute illness, the patient never underwent definitive treatment for her gallbladder disease.  According to the patient she has been told that she is not a candidate for cholecystectomy.  I would not consider this elderly patient with numerous comorbid medical problems a candidate for conventional surgical aortic valve replacement under any circumstances.  In addition, I would not recommend proceeding with further diagnostic workup to evaluate the potential feasibility of transcatheter aortic valve replacement at the present time.  Such an evaluation would require left and right heart catheterization followed by CT angiography, all of which would be associated with considerable risk associated with IV contrast administration and potentially push this patient into acute renal failure.  However, if the patient continues to improve and is felt to be candidate for elective cholecystectomy, transcatheter aortic valve replacement could be reconsidered in the future.  For now I would recommend medical therapy with very close follow-up for management of the patient's aortic stenosis.  Plan:  The patient will continue to follow up closely with [name] and Dr. Moshe Cipro.  Follow-up consultation with General Surgery to reconsider high risk elective cholecystectomy should be  considered if the patient desires an aggressive approach to her care.   If she were felt to be a surgical candidate we could reconsider proceeding with pre-TAVR diagnostic testing as desired.  Under the circumstances, I feel that a palliative approach might be most appropriate for the patient's long term care.   I spent in excess of 90 minutes during the conduct of this office consultation and >50% of this time involved direct face-to-face encounter with the patient for counseling and/or coordination of their care.   Valentina Gu. Roxy Manns, MD 10/16/2014 12:14 PM

## 2014-10-20 ENCOUNTER — Encounter (HOSPITAL_COMMUNITY)
Admission: RE | Admit: 2014-10-20 | Discharge: 2014-10-20 | Disposition: A | Discharge status: Home / Self Care | Payer: Medicare Other | Source: Ambulatory Visit | Attending: Nephrology | Admitting: Nephrology

## 2014-10-20 DIAGNOSIS — Z5181 Encounter for therapeutic drug level monitoring: Secondary | ICD-10-CM | POA: Insufficient documentation

## 2014-10-20 DIAGNOSIS — Z79899 Other long term (current) drug therapy: Secondary | ICD-10-CM | POA: Insufficient documentation

## 2014-10-20 DIAGNOSIS — N184 Chronic kidney disease, stage 4 (severe): Secondary | ICD-10-CM | POA: Insufficient documentation

## 2014-10-20 DIAGNOSIS — D631 Anemia in chronic kidney disease: Secondary | ICD-10-CM | POA: Diagnosis not present

## 2014-10-20 LAB — RENAL FUNCTION PANEL
Albumin: 2.9 g/dL — ABNORMAL LOW (ref 3.5–5.0)
Anion gap: 14 (ref 5–15)
BUN: 48 mg/dL — ABNORMAL HIGH (ref 6–20)
CO2: 26 mmol/L (ref 22–32)
CREATININE: 2.97 mg/dL — AB (ref 0.44–1.00)
Calcium: 9 mg/dL (ref 8.9–10.3)
Chloride: 102 mmol/L (ref 101–111)
GFR calc Af Amer: 15 mL/min — ABNORMAL LOW (ref >60)
GFR calc non Af Amer: 13 mL/min — ABNORMAL LOW (ref >60)
GLUCOSE: 204 mg/dL — AB (ref 65–99)
PHOSPHORUS: 4.6 mg/dL (ref 2.5–4.6)
Potassium: 3.6 mmol/L (ref 3.5–5.1)
SODIUM: 142 mmol/L (ref 135–145)

## 2014-10-20 LAB — POCT HEMOGLOBIN-HEMACUE: Hemoglobin: 11.7 g/dL — ABNORMAL LOW (ref 12.0–15.0)

## 2014-10-20 LAB — IRON AND TIBC
IRON: 45 ug/dL (ref 28–170)
Saturation Ratios: 21 % (ref 10.4–31.8)
TIBC: 218 ug/dL — ABNORMAL LOW (ref 250–450)
UIBC: 173 ug/dL

## 2014-10-20 LAB — FERRITIN: Ferritin: 737 ng/mL — ABNORMAL HIGH (ref 11–307)

## 2014-10-20 MED ORDER — EPOETIN ALFA 20000 UNIT/ML IJ SOLN
INTRAMUSCULAR | Status: AC
  Filled 2014-10-20: qty 1

## 2014-10-20 MED ORDER — EPOETIN ALFA 20000 UNIT/ML IJ SOLN
20000.0000 [IU] | INTRAMUSCULAR | Status: DC
  Administered 2014-10-20: 20000 [IU] via SUBCUTANEOUS

## 2014-10-21 LAB — PTH, INTACT AND CALCIUM
CALCIUM TOTAL (PTH): 8.9 mg/dL (ref 8.7–10.3)
PTH: 122 pg/mL — ABNORMAL HIGH (ref 15–65)

## 2014-10-23 ENCOUNTER — Ambulatory Visit (INDEPENDENT_AMBULATORY_CARE_PROVIDER_SITE_OTHER): Payer: Medicare Other | Admitting: Nurse Practitioner

## 2014-10-23 ENCOUNTER — Encounter: Payer: Self-pay | Admitting: Nurse Practitioner

## 2014-10-23 ENCOUNTER — Ambulatory Visit: Payer: PRIVATE HEALTH INSURANCE | Admitting: Cardiology

## 2014-10-23 ENCOUNTER — Encounter: Payer: Medicare Other | Admitting: Physician Assistant

## 2014-10-23 VITALS — BP 110/60 | HR 71 | Resp 18 | Ht 63.0 in | Wt 166.0 lb

## 2014-10-23 DIAGNOSIS — I6523 Occlusion and stenosis of bilateral carotid arteries: Secondary | ICD-10-CM

## 2014-10-23 DIAGNOSIS — I5032 Chronic diastolic (congestive) heart failure: Secondary | ICD-10-CM

## 2014-10-23 DIAGNOSIS — I350 Nonrheumatic aortic (valve) stenosis: Secondary | ICD-10-CM | POA: Diagnosis not present

## 2014-10-23 DIAGNOSIS — I10 Essential (primary) hypertension: Secondary | ICD-10-CM

## 2014-10-23 DIAGNOSIS — K819 Cholecystitis, unspecified: Secondary | ICD-10-CM

## 2014-10-23 HISTORY — DX: Cholecystitis, unspecified: K81.9

## 2014-10-23 NOTE — Patient Instructions (Addendum)
We will be checking the following labs today - NONE   Medication Instructions:    Continue with your current medicines.     Testing/Procedures To Be Arranged:  N/A  Follow-Up:   See [name] as planned.     Other Special Instructions:   N/A  Call the [facility] office at [phone] if you have any questions, problems or concerns.

## 2014-10-23 NOTE — Progress Notes (Signed)
CARDIOLOGY OFFICE NOTE  Date:  10/23/2014    [name] Date of Birth: Jul 31, 1925 Medical Record #[id number]  PCP:  Abigail Miyamoto, MD  Cardiologist:  Kirt Boys Chief Complaint  Patient presents with  . Aortic Stenosis    Post hospital visit - seen for [name].     History of Present Illness: BRIANNIA [name] is a 81 y.o. female who presents today for a post hospital visit. Seen for [name]. She has multiple severe comorbid medical conditions including advanced kidney disease with placement of an AV fistula in 2014, insulin-requiring diabetes, and chronic diastolic heart failure with a recently placed Pleurx catheter to drain a recurrent symptomatic pleural effusion. She had remote colon cancer and has had a colostomy for over 40 years now. She has chronic AF and has not been a candidate due to increased risk of falls. Other issues include advanced age, bilateral carotid disease, DM, anemia.   She was hospitalized in April with acalculous cholecystitis and was treated with a percutaneous drain. Right pleural effusion was initially discovered at that time.   She lives in [place], but has been living with her daughter in [place] since undergoing placement of a pleural drainage catheter. She was quite active in her 39's and she cared for her husband who was ill for many years. However, over the past 5 years she has become increasingly sedentary. She had 7 children, 6 still living.  She saw [name] back in July.   Recently readmitted thru the ED with sudden onset of nausea, nonbilious, nonbloody emesis, diffuse abdominal pain which started at 3 AM on the morning of admission.  Patient also endorses increased colostomy output with watery stools which have been ongoing for the past 2-3 days. Patient denies any melanoma however states has black stools secondary to being on iron pills. Patient denies any hematemesis. She denies any hematochezia. Patient with decreased oral  intake on the day of admission however has had adequate oral input. She was found to have C diff - treated with vancomycin. Heart failure was managed. Had palliative care consult. Noted that she has agreed to proceed on with TAVR and dialysis but currently in "obeservation" mode.    Comes back today. Here with her daughter. Home about 3 weeks. No problems noted. Little vomiting this past Saturday but seems to have resolved. Had eaten barbeque ribs on Friday night. Some right sided abdominal pain - has known gallstones. No more diarrhea. Has finished Vancomycin. She is to see Dr. Moshe Cipro back in 3 months - no pressing need for dialysis at this time per daughter's report. No chest pain. Breathing is ok. Weight is staying down. Has had labs on Friday. Kidney function seems stable.   Past Medical History  Diagnosis Date  . CKD (chronic kidney disease), stage IV     a. Baseline Cr reported 2.5-2.6.  Marland Kitchen Anxiety   . Diabetes mellitus     diabetic retinopathy  . Anemia   . Urinary tract infection   . Fall at home   . Colostomy care     colon ca - h/o bloody output from bag 12/2012 (ED visit)  . Colon cancer 30 year ago  . Chronic atrial fibrillation     a. not on anticoagulation due to increased risk of falls, advanced age.  . Chronic diastolic CHF (congestive heart failure)     a. Echo 10/2013: mild LVH, EF 60-65%, mod AS, mildly dilated LA, mod  dilated RA, PASP 33.  Marland Kitchen Hypertension   . Carotid disease, bilateral     a. Carotid duplex 05/2669: 24-58% RICA/LICA.  Marland Kitchen H/O cardiovascular stress test     a. 2002: nuc negative for ischemia, EF 65%.  . Shortness of breath dyspnea   . Severe aortic stenosis 08/23/2014  . Heart murmur   . Depression     situational  . Headache   . Arthritis   . Recurrent pleural effusion on right   . Chronic kidney disease (CKD), stage IV (severe) 04/27/2012  . Choledocholithiasis with acute cholecystitis 06/14/2014  . Clostridium difficile diarrhea   . Type II  diabetes mellitus with complication 0/99/8338    Past Surgical History  Procedure Laterality Date  . Abdominal surgery    . Abdominal hysterectomy    . Colon surgery    . Appendectomy    . Colostomy    . Cardioversion  09/30/2011    Procedure: CARDIOVERSION;  Surgeon: [name], MD;  Location: [facility];  Service: Cardiovascular;  Laterality: N/A;  . I&d extremity  11/20/2011    Procedure: IRRIGATION AND DEBRIDEMENT EXTREMITY;  Surgeon: Alta Corning, MD;  Location: [facility];  Service: Orthopedics;  Laterality: Left;  . Av fistula placement Left 06/02/2012    Procedure: ARTERIOVENOUS (AV) FISTULA CREATION;  Surgeon: Conrad Lorenzo, MD;  Location: [facility];  Service: Vascular;  Laterality: Left;  Bascilic Fistula  . Bascilic vein transposition Left 08/18/2012    Procedure: BASCILIC VEIN TRANSPOSITION;  Surgeon: Conrad Montpelier, MD;  Location: [facility];  Service: Vascular;  Laterality: Left;  Left 2nd stage Basilic Vein Transposition   . Ercp N/A 07/11/2014    Procedure: ENDOSCOPIC RETROGRADE CHOLANGIOPANCREATOGRAPHY (ERCP);  Surgeon: Inda Castle, MD;  Location: [facility];  Service: Endoscopy;  Laterality: N/A;  . Biliary stent placement N/A 07/11/2014    Procedure: BILIARY STENT PLACEMENT;  Surgeon: Inda Castle, MD;  Location: [facility];  Service: Endoscopy;  Laterality: N/A;  . Ercp N/A 07/21/2014    Procedure: ENDOSCOPIC RETROGRADE CHOLANGIOPANCREATOGRAPHY (ERCP);  Surgeon: Inda Castle, MD;  Location: [facility];  Service: Endoscopy;  Laterality: N/A;  . Fracture surgery      Right leg  . Chest tube insertion Right 09/12/2014    Procedure: INSERTION PLEURAL DRAINAGE CATHETER;  Surgeon: Melrose Nakayama, MD;  Location: [facility];  Service: Thoracic;  Laterality: Right;  . Ct perc cholecystostomy  05/11/2014     Medications: Current Outpatient Prescriptions  Medication Sig Dispense Refill  . acetaminophen (TYLENOL) 500 MG tablet Take 500 mg by mouth every 6 (six) hours as needed for  mild pain.     . calcitRIOL (ROCALTROL) 0.25 MCG capsule Take 0.25 mcg by mouth daily.     . carvedilol (COREG) 25 MG tablet Take 25 mg by mouth 2 (two) times daily.  3  . clotrimazole-betamethasone (LOTRISONE) lotion Apply 1 application topically daily as needed (apply to rash on stomach as needed).   0  . docusate sodium (COLACE) 100 MG capsule Take 100 mg by mouth daily as needed for constipation.     Marland Kitchen epoetin alfa (EPOGEN,PROCRIT) 25053 UNIT/ML injection 20,000 Units. Every two weeks    . furosemide (LASIX) 80 MG tablet Take 1 tablet (80 mg total) by mouth 2 (two) times daily. 60 tablet 3  . HYDROcodone-acetaminophen (NORCO/VICODIN) 5-325 MG per tablet Take 1 tablet by mouth every 4 (four) hours as needed for moderate pain. 20 tablet 0  . insulin glargine (  LANTUS) 100 UNIT/ML injection Inject 8 Units into the skin at bedtime as needed (if cbg over 200).     Marland Kitchen linagliptin (TRADJENTA) 5 MG TABS tablet Take 5 mg by mouth daily.     Marland Kitchen LORazepam (ATIVAN) 0.5 MG tablet Take 0.5 mg by mouth 2 (two) times daily.    . meclizine (ANTIVERT) 12.5 MG tablet Take 12.5 mg by mouth as needed for dizziness.     . mirtazapine (REMERON) 7.5 MG tablet Take 7.5 mg by mouth at bedtime.     . promethazine (PHENERGAN) 12.5 MG tablet Take 1 tablet (12.5 mg total) by mouth every 6 (six) hours as needed for nausea or vomiting. 30 tablet 0  . saccharomyces boulardii (FLORASTOR) 250 MG capsule Take 1 capsule (250 mg total) by mouth 2 (two) times daily. 60 capsule 0  . simvastatin (ZOCOR) 20 MG tablet TAKE 1 TABLET (20 MG TOTAL) BY MOUTH AT BEDTIME. 30 tablet 5  . traMADol (ULTRAM) 50 MG tablet Take 1 tablet (50 mg total) by mouth every 6 (six) hours as needed for moderate pain. 30 tablet 0  . vitamin B-12 (CYANOCOBALAMIN) 1000 MCG tablet Take 1,000 mcg by mouth daily.    Marland Kitchen aspirin EC 81 MG tablet Take 81 mg by mouth at bedtime.      No current facility-administered medications for this visit.    Allergies: Allergies   Allergen Reactions  . Levaquin [Levofloxacin] Nausea Only  . Codeine Nausea Only  . Keflet [Cephalexin] Nausea Only  . Morphine And Related Nausea Only  . Oxycodone Other (See Comments)    Abnormal behavior    Social History: The patient  reports that she has never smoked. Her smokeless tobacco use includes Chew. She reports that she does not drink alcohol or use illicit drugs.   Family History: The patient's family history includes Cancer in her son; Diabetes in her daughter, sister, and son; Heart attack in her father; Heart disease in her father and son; Hyperlipidemia in her daughter and son; Hypertension in her daughter and son; Other in her daughter; Stroke in her father.   Review of Systems: Please see the history of present illness.   Otherwise, the review of systems is positive for DOE, abdominal pain, depression, back pain, easy bruising, irregular heart beat, and headache.   All other systems are reviewed and negative.   Physical Exam: VS:  BP 110/60 mmHg  Pulse 71  Resp 18  Ht 5\' 3"  (1.6 m)  Wt 166 lb (75.297 kg)  BMI 29.41 kg/m2  SpO2 96% .  BMI Body mass index is 29.41 kg/(m^2).  Wt Readings from Last 3 Encounters:  10/23/14 166 lb (75.297 kg)  10/16/14 167 lb (75.751 kg)  10/13/14 179 lb 3.2 oz (81.285 kg)    General: Pleasant. She looks chronically ill but in no acute distress. She is in a wheelchair.  HEENT: Normal. Neck: Supple, no JVD, carotid bruits, or masses noted.  Cardiac: Irregular irregular rhythm. Harsh outflow murmur. +1 edema.  Respiratory:  Lungs are fairly clear - some scattered crackles on the right. Pleurex in place.  GI: Soft and nontender.  MS: No deformity or atrophy. Gait not tested. ROM intact. Skin: Warm and dry. Color is normal.  Neuro:  Strength and sensation are intact and no gross focal deficits noted.  Psych: Alert, appropriate and with normal affect.   LABORATORY DATA:  EKG:  EKG is not ordered today.  Lab Results    Component Value Date  WBC 7.8 09/26/2014   HGB 11.7* 10/20/2014   HCT 30.3* 09/26/2014   PLT 152 09/26/2014   GLUCOSE 204* 10/20/2014   CHOL 87 07/14/2014   TRIG 136.0 06/15/2014   HDL 36.30* 06/15/2014   LDLCALC 29 06/15/2014   ALT 7* 09/23/2014   AST 14* 09/23/2014   NA 142 10/20/2014   K 3.6 10/20/2014   CL 102 10/20/2014   CREATININE 2.97* 10/20/2014   BUN 48* 10/20/2014   CO2 26 10/20/2014   TSH 0.935 07/17/2014   INR 1.29 09/12/2014   HGBA1C 6.5* 09/22/2014    BNP (last 3 results)  Recent Labs  03/15/14 0914 07/14/14 1125  BNP 941.4* 526.1*    ProBNP (last 3 results)  Recent Labs  09/08/14 1517  PROBNP 312.0*     Other Studies Reviewed Today: Echo Study Conclusions from 08/2014  - Left ventricle: The cavity size was normal. There was moderate concentric hypertrophy. Systolic function was vigorous. The estimated ejection fraction was in the range of 65% to 70%. Wall motion was normal; there were no regional wall motion abnormalities. - Aortic valve: Valve mobility was severely restricted. There was severe stenosis. - Mitral valve: Calcified annulus. - Left atrium: The atrium was mildly dilated. - Pulmonary arteries: Systolic pressure was mildly increased. PA peak pressure: 34 mm Hg (S). - Pericardium, extracardiac: A trivial pericardial effusion was identified posterior to the heart.   Assessment/Plan:  1. C. difficile diarrhea/colitis Treated with Vancomycin and reports resolution of symptoms.  Hypotension - BP now ok on current regimen  Acute on Stage IV chronic kidney disease She is seeing nephrology.   Chronic diastolic CHF Looks compensated. Weight is down. Swelling has improved. Would keep on current regimen.   Chronic/recurrent right pleural effusion, s/p Pleurx catheter placement Seeing Dr. Roxan Hockey in about one week - no current output reported by daughter.   Severe symptomatic aortic stenosis - continues in  observation mode for now. Patient tells me no plan for proceeding on at this time.   Chronic A. Fib - Rate controlled. Not anticoagulation candidate.  Failure to thrive  Current medicines are reviewed with the patient today.  The patient does not have concerns regarding medicines other than what has been noted above.  The following changes have been made:  See above.  Labs/ tests ordered today include:   No orders of the defined types were placed in this encounter.     Disposition:   FU with [facility] next month as planned.    Patient is agreeable to this plan and will call if any problems develop in the interim.   Signed: Burtis Junes, RN, ANP-C 10/23/2014 10:42 AM  [facility] [address] [place], [place]  [address] Phone: [phone] Fax: [phone]

## 2014-10-24 ENCOUNTER — Emergency Department (HOSPITAL_COMMUNITY): Payer: Medicare Other

## 2014-10-24 ENCOUNTER — Inpatient Hospital Stay (HOSPITAL_COMMUNITY)
Admission: EM | Admit: 2014-10-24 | Discharge: 2014-11-01 | DRG: 414 | Disposition: A | Discharge status: Home Health | Payer: Medicare Other | Attending: Internal Medicine | Admitting: Internal Medicine

## 2014-10-24 ENCOUNTER — Encounter (HOSPITAL_COMMUNITY): Payer: Self-pay | Admitting: Emergency Medicine

## 2014-10-24 DIAGNOSIS — N179 Acute kidney failure, unspecified: Secondary | ICD-10-CM | POA: Diagnosis not present

## 2014-10-24 DIAGNOSIS — Z6833 Body mass index (BMI) 33.0-33.9, adult: Secondary | ICD-10-CM | POA: Diagnosis not present

## 2014-10-24 DIAGNOSIS — N2581 Secondary hyperparathyroidism of renal origin: Secondary | ICD-10-CM | POA: Diagnosis present

## 2014-10-24 DIAGNOSIS — Z7982 Long term (current) use of aspirin: Secondary | ICD-10-CM

## 2014-10-24 DIAGNOSIS — R7989 Other specified abnormal findings of blood chemistry: Secondary | ICD-10-CM

## 2014-10-24 DIAGNOSIS — N184 Chronic kidney disease, stage 4 (severe): Secondary | ICD-10-CM | POA: Diagnosis not present

## 2014-10-24 DIAGNOSIS — R112 Nausea with vomiting, unspecified: Secondary | ICD-10-CM | POA: Diagnosis not present

## 2014-10-24 DIAGNOSIS — D696 Thrombocytopenia, unspecified: Secondary | ICD-10-CM

## 2014-10-24 DIAGNOSIS — E11319 Type 2 diabetes mellitus with unspecified diabetic retinopathy without macular edema: Secondary | ICD-10-CM | POA: Diagnosis present

## 2014-10-24 DIAGNOSIS — R109 Unspecified abdominal pain: Secondary | ICD-10-CM

## 2014-10-24 DIAGNOSIS — J189 Pneumonia, unspecified organism: Secondary | ICD-10-CM

## 2014-10-24 DIAGNOSIS — F419 Anxiety disorder, unspecified: Secondary | ICD-10-CM | POA: Diagnosis present

## 2014-10-24 DIAGNOSIS — I482 Chronic atrial fibrillation, unspecified: Secondary | ICD-10-CM

## 2014-10-24 DIAGNOSIS — I350 Nonrheumatic aortic (valve) stenosis: Secondary | ICD-10-CM | POA: Diagnosis present

## 2014-10-24 DIAGNOSIS — I5032 Chronic diastolic (congestive) heart failure: Secondary | ICD-10-CM | POA: Diagnosis not present

## 2014-10-24 DIAGNOSIS — E1122 Type 2 diabetes mellitus with diabetic chronic kidney disease: Secondary | ICD-10-CM

## 2014-10-24 DIAGNOSIS — J9601 Acute respiratory failure with hypoxia: Secondary | ICD-10-CM

## 2014-10-24 DIAGNOSIS — K8042 Calculus of bile duct with acute cholecystitis without obstruction: Secondary | ICD-10-CM

## 2014-10-24 DIAGNOSIS — N390 Urinary tract infection, site not specified: Secondary | ICD-10-CM | POA: Diagnosis present

## 2014-10-24 DIAGNOSIS — R0902 Hypoxemia: Secondary | ICD-10-CM

## 2014-10-24 DIAGNOSIS — Z01810 Encounter for preprocedural cardiovascular examination: Secondary | ICD-10-CM | POA: Diagnosis not present

## 2014-10-24 DIAGNOSIS — N189 Chronic kidney disease, unspecified: Secondary | ICD-10-CM

## 2014-10-24 DIAGNOSIS — Z66 Do not resuscitate: Secondary | ICD-10-CM | POA: Diagnosis present

## 2014-10-24 DIAGNOSIS — I5033 Acute on chronic diastolic (congestive) heart failure: Secondary | ICD-10-CM

## 2014-10-24 DIAGNOSIS — F17220 Nicotine dependence, chewing tobacco, uncomplicated: Secondary | ICD-10-CM | POA: Diagnosis present

## 2014-10-24 DIAGNOSIS — R918 Other nonspecific abnormal finding of lung field: Secondary | ICD-10-CM

## 2014-10-24 DIAGNOSIS — I120 Hypertensive chronic kidney disease with stage 5 chronic kidney disease or end stage renal disease: Secondary | ICD-10-CM | POA: Diagnosis present

## 2014-10-24 DIAGNOSIS — A0472 Enterocolitis due to Clostridium difficile, not specified as recurrent: Secondary | ICD-10-CM

## 2014-10-24 DIAGNOSIS — Z4931 Encounter for adequacy testing for hemodialysis: Secondary | ICD-10-CM

## 2014-10-24 DIAGNOSIS — B9620 Unspecified Escherichia coli [E. coli] as the cause of diseases classified elsewhere: Secondary | ICD-10-CM | POA: Diagnosis present

## 2014-10-24 DIAGNOSIS — F329 Major depressive disorder, single episode, unspecified: Secondary | ICD-10-CM | POA: Diagnosis present

## 2014-10-24 DIAGNOSIS — E860 Dehydration: Secondary | ICD-10-CM

## 2014-10-24 DIAGNOSIS — C189 Malignant neoplasm of colon, unspecified: Secondary | ICD-10-CM

## 2014-10-24 DIAGNOSIS — I10 Essential (primary) hypertension: Secondary | ICD-10-CM

## 2014-10-24 DIAGNOSIS — J90 Pleural effusion, not elsewhere classified: Secondary | ICD-10-CM | POA: Diagnosis not present

## 2014-10-24 DIAGNOSIS — Z515 Encounter for palliative care: Secondary | ICD-10-CM

## 2014-10-24 DIAGNOSIS — Z85048 Personal history of other malignant neoplasm of rectum, rectosigmoid junction, and anus: Secondary | ICD-10-CM

## 2014-10-24 DIAGNOSIS — N170 Acute kidney failure with tubular necrosis: Secondary | ICD-10-CM | POA: Diagnosis not present

## 2014-10-24 DIAGNOSIS — I77810 Thoracic aortic ectasia: Secondary | ICD-10-CM

## 2014-10-24 DIAGNOSIS — E118 Type 2 diabetes mellitus with unspecified complications: Secondary | ICD-10-CM

## 2014-10-24 DIAGNOSIS — K810 Acute cholecystitis: Principal | ICD-10-CM | POA: Diagnosis present

## 2014-10-24 DIAGNOSIS — D72829 Elevated white blood cell count, unspecified: Secondary | ICD-10-CM

## 2014-10-24 DIAGNOSIS — D649 Anemia, unspecified: Secondary | ICD-10-CM | POA: Diagnosis present

## 2014-10-24 DIAGNOSIS — Z79899 Other long term (current) drug therapy: Secondary | ICD-10-CM | POA: Diagnosis not present

## 2014-10-24 DIAGNOSIS — D6959 Other secondary thrombocytopenia: Secondary | ICD-10-CM | POA: Diagnosis present

## 2014-10-24 DIAGNOSIS — Z933 Colostomy status: Secondary | ICD-10-CM | POA: Diagnosis not present

## 2014-10-24 DIAGNOSIS — R197 Diarrhea, unspecified: Secondary | ICD-10-CM

## 2014-10-24 DIAGNOSIS — K819 Cholecystitis, unspecified: Secondary | ICD-10-CM | POA: Diagnosis not present

## 2014-10-24 DIAGNOSIS — K831 Obstruction of bile duct: Secondary | ICD-10-CM

## 2014-10-24 DIAGNOSIS — Z794 Long term (current) use of insulin: Secondary | ICD-10-CM | POA: Diagnosis not present

## 2014-10-24 DIAGNOSIS — E876 Hypokalemia: Secondary | ICD-10-CM

## 2014-10-24 DIAGNOSIS — R0602 Shortness of breath: Secondary | ICD-10-CM

## 2014-10-24 DIAGNOSIS — I5189 Other ill-defined heart diseases: Secondary | ICD-10-CM

## 2014-10-24 DIAGNOSIS — K8040 Calculus of bile duct with cholecystitis, unspecified, without obstruction: Secondary | ICD-10-CM | POA: Diagnosis not present

## 2014-10-24 DIAGNOSIS — R1011 Right upper quadrant pain: Secondary | ICD-10-CM

## 2014-10-24 HISTORY — DX: Other specified anxiety disorders: F41.8

## 2014-10-24 HISTORY — DX: Thrombocytopenia, unspecified: D69.6

## 2014-10-24 HISTORY — DX: Cholecystitis, unspecified: K81.9

## 2014-10-24 LAB — COMPREHENSIVE METABOLIC PANEL
ALBUMIN: 2.7 g/dL — AB (ref 3.5–5.0)
ALT: 8 U/L — AB (ref 14–54)
AST: 17 U/L (ref 15–41)
Alkaline Phosphatase: 117 U/L (ref 38–126)
Anion gap: 11 (ref 5–15)
BUN: 54 mg/dL — ABNORMAL HIGH (ref 6–20)
CO2: 27 mmol/L (ref 22–32)
CREATININE: 3.03 mg/dL — AB (ref 0.44–1.00)
Calcium: 8.6 mg/dL — ABNORMAL LOW (ref 8.9–10.3)
Chloride: 97 mmol/L — ABNORMAL LOW (ref 101–111)
GFR calc non Af Amer: 13 mL/min — ABNORMAL LOW (ref >60)
GFR, EST AFRICAN AMERICAN: 15 mL/min — AB (ref >60)
Glucose, Bld: 207 mg/dL — ABNORMAL HIGH (ref 65–99)
Potassium: 3.8 mmol/L (ref 3.5–5.1)
Sodium: 135 mmol/L (ref 135–145)
TOTAL PROTEIN: 6 g/dL — AB (ref 6.5–8.1)
Total Bilirubin: 0.8 mg/dL (ref 0.3–1.2)

## 2014-10-24 LAB — CBC WITH DIFFERENTIAL/PLATELET
Basophils Absolute: 0 10*3/uL (ref 0.0–0.1)
Basophils Relative: 0 % (ref 0–1)
EOS PCT: 0 % (ref 0–5)
Eosinophils Absolute: 0 10*3/uL (ref 0.0–0.7)
HCT: 38.7 % (ref 36.0–46.0)
Hemoglobin: 11.8 g/dL — ABNORMAL LOW (ref 12.0–15.0)
LYMPHS ABS: 1.2 10*3/uL (ref 0.7–4.0)
Lymphocytes Relative: 10 % — ABNORMAL LOW (ref 12–46)
MCH: 28.9 pg (ref 26.0–34.0)
MCHC: 30.5 g/dL (ref 30.0–36.0)
MCV: 94.6 fL (ref 78.0–100.0)
MONO ABS: 0.9 10*3/uL (ref 0.1–1.0)
MONOS PCT: 8 % (ref 3–12)
NEUTROS PCT: 82 % — AB (ref 43–77)
Neutro Abs: 9.7 10*3/uL — ABNORMAL HIGH (ref 1.7–7.7)
Platelets: 141 10*3/uL — ABNORMAL LOW (ref 150–400)
RBC: 4.09 MIL/uL (ref 3.87–5.11)
RDW: 15.9 % — ABNORMAL HIGH (ref 11.5–15.5)
WBC: 11.8 10*3/uL — ABNORMAL HIGH (ref 4.0–10.5)

## 2014-10-24 LAB — URINALYSIS, ROUTINE W REFLEX MICROSCOPIC
Bilirubin Urine: NEGATIVE
GLUCOSE, UA: NEGATIVE mg/dL
HGB URINE DIPSTICK: NEGATIVE
Ketones, ur: NEGATIVE mg/dL
Nitrite: NEGATIVE
Protein, ur: NEGATIVE mg/dL
Specific Gravity, Urine: 1.008 (ref 1.005–1.030)
Urobilinogen, UA: 0.2 mg/dL (ref 0.0–1.0)
pH: 5.5 (ref 5.0–8.0)

## 2014-10-24 LAB — GLUCOSE, CAPILLARY
GLUCOSE-CAPILLARY: 87 mg/dL (ref 65–99)
Glucose-Capillary: 100 mg/dL — ABNORMAL HIGH (ref 65–99)
Glucose-Capillary: 126 mg/dL — ABNORMAL HIGH (ref 65–99)
Glucose-Capillary: 147 mg/dL — ABNORMAL HIGH (ref 65–99)

## 2014-10-24 LAB — URINE MICROSCOPIC-ADD ON

## 2014-10-24 LAB — I-STAT CG4 LACTIC ACID, ED: Lactic Acid, Venous: 1.31 mmol/L (ref 0.5–2.0)

## 2014-10-24 LAB — LIPASE, BLOOD: LIPASE: 58 U/L — AB (ref 22–51)

## 2014-10-24 MED ORDER — SODIUM CHLORIDE 0.9 % IV SOLN
INTRAVENOUS | Status: DC
  Administered 2014-10-24: 50 mL/h via INTRAVENOUS
  Administered 2014-10-25 (×2): via INTRAVENOUS

## 2014-10-24 MED ORDER — MORPHINE SULFATE 2 MG/ML IJ SOLN
2.0000 mg | INTRAMUSCULAR | Status: DC | PRN
  Administered 2014-10-24: 2 mg via INTRAVENOUS
  Filled 2014-10-24: qty 1

## 2014-10-24 MED ORDER — SIMVASTATIN 20 MG PO TABS
20.0000 mg | ORAL_TABLET | Freq: Every day | ORAL | Status: DC
  Administered 2014-10-24 – 2014-10-31 (×7): 20 mg via ORAL
  Filled 2014-10-24 (×9): qty 1

## 2014-10-24 MED ORDER — SACCHAROMYCES BOULARDII 250 MG PO CAPS
250.0000 mg | ORAL_CAPSULE | Freq: Two times a day (BID) | ORAL | Status: DC
  Administered 2014-10-24 – 2014-11-01 (×16): 250 mg via ORAL
  Filled 2014-10-24 (×21): qty 1

## 2014-10-24 MED ORDER — FENTANYL CITRATE (PF) 100 MCG/2ML IJ SOLN
50.0000 ug | Freq: Once | INTRAMUSCULAR | Status: AC
  Administered 2014-10-24: 50 ug via INTRAVENOUS
  Filled 2014-10-24: qty 2

## 2014-10-24 MED ORDER — ACETAMINOPHEN 325 MG PO TABS
650.0000 mg | ORAL_TABLET | Freq: Four times a day (QID) | ORAL | Status: DC | PRN
  Administered 2014-10-30: 650 mg via ORAL
  Filled 2014-10-24: qty 2

## 2014-10-24 MED ORDER — SODIUM CHLORIDE 0.9 % IJ SOLN
3.0000 mL | Freq: Two times a day (BID) | INTRAMUSCULAR | Status: DC
  Administered 2014-10-26 – 2014-10-31 (×9): 3 mL via INTRAVENOUS

## 2014-10-24 MED ORDER — ONDANSETRON HCL 4 MG/2ML IJ SOLN
4.0000 mg | Freq: Four times a day (QID) | INTRAMUSCULAR | Status: DC | PRN
  Administered 2014-10-24 – 2014-10-26 (×2): 4 mg via INTRAVENOUS
  Filled 2014-10-24 (×2): qty 2

## 2014-10-24 MED ORDER — INSULIN ASPART 100 UNIT/ML ~~LOC~~ SOLN
0.0000 [IU] | SUBCUTANEOUS | Status: DC
  Administered 2014-10-24 – 2014-10-27 (×4): 1 [IU] via SUBCUTANEOUS
  Administered 2014-10-27: 2 [IU] via SUBCUTANEOUS
  Administered 2014-10-27 (×3): 1 [IU] via SUBCUTANEOUS
  Administered 2014-10-28: 2 [IU] via SUBCUTANEOUS
  Administered 2014-10-28: 1 [IU] via SUBCUTANEOUS
  Administered 2014-10-28 (×2): 2 [IU] via SUBCUTANEOUS

## 2014-10-24 MED ORDER — MIRTAZAPINE 7.5 MG PO TABS
7.5000 mg | ORAL_TABLET | Freq: Every day | ORAL | Status: DC
  Administered 2014-10-24 – 2014-10-25 (×2): 7.5 mg via ORAL
  Filled 2014-10-24 (×4): qty 1

## 2014-10-24 MED ORDER — HEPARIN SODIUM (PORCINE) 5000 UNIT/ML IJ SOLN
5000.0000 [IU] | Freq: Three times a day (TID) | INTRAMUSCULAR | Status: DC
  Administered 2014-10-24 – 2014-10-25 (×3): 5000 [IU] via SUBCUTANEOUS
  Filled 2014-10-24 (×3): qty 1

## 2014-10-24 MED ORDER — PIPERACILLIN-TAZOBACTAM IN DEX 2-0.25 GM/50ML IV SOLN
2.2500 g | Freq: Three times a day (TID) | INTRAVENOUS | Status: DC
  Administered 2014-10-24 – 2014-10-27 (×9): 2.25 g via INTRAVENOUS
  Filled 2014-10-24 (×11): qty 50

## 2014-10-24 MED ORDER — ACETAMINOPHEN 650 MG RE SUPP: 650.0000 mg | Freq: Four times a day (QID) | RECTAL | Status: DC | PRN

## 2014-10-24 MED ORDER — PROMETHAZINE HCL 25 MG/ML IJ SOLN
6.2500 mg | Freq: Once | INTRAMUSCULAR | Status: AC
  Administered 2014-10-24: 6.25 mg via INTRAVENOUS
  Filled 2014-10-24: qty 1

## 2014-10-24 MED ORDER — LORAZEPAM 0.5 MG PO TABS
0.5000 mg | ORAL_TABLET | Freq: Two times a day (BID) | ORAL | Status: DC
  Administered 2014-10-24 – 2014-11-01 (×16): 0.5 mg via ORAL
  Filled 2014-10-24 (×16): qty 1

## 2014-10-24 MED ORDER — PIPERACILLIN-TAZOBACTAM 3.375 G IVPB 30 MIN
3.3750 g | Freq: Once | INTRAVENOUS | Status: AC
  Administered 2014-10-24: 3.375 g via INTRAVENOUS
  Filled 2014-10-24: qty 50

## 2014-10-24 MED ORDER — CALCITRIOL 0.25 MCG PO CAPS
0.2500 ug | ORAL_CAPSULE | Freq: Every day | ORAL | Status: DC
  Administered 2014-10-24 – 2014-11-01 (×8): 0.25 ug via ORAL
  Filled 2014-10-24 (×9): qty 1

## 2014-10-24 MED ORDER — FUROSEMIDE 80 MG PO TABS
80.0000 mg | ORAL_TABLET | Freq: Two times a day (BID) | ORAL | Status: DC
  Administered 2014-10-24: 80 mg via ORAL
  Filled 2014-10-24: qty 1

## 2014-10-24 MED ORDER — ASPIRIN EC 81 MG PO TBEC
81.0000 mg | DELAYED_RELEASE_TABLET | Freq: Every day | ORAL | Status: DC
  Administered 2014-10-24 – 2014-10-31 (×8): 81 mg via ORAL
  Filled 2014-10-24 (×9): qty 1

## 2014-10-24 MED ORDER — CARVEDILOL 25 MG PO TABS
25.0000 mg | ORAL_TABLET | Freq: Two times a day (BID) | ORAL | Status: DC
  Administered 2014-10-24 – 2014-10-29 (×11): 25 mg via ORAL
  Filled 2014-10-24 (×12): qty 1

## 2014-10-24 NOTE — Care Management Note (Signed)
Case Management Note  Patient Details  Name: [name] MRN: [id number] Date of Birth: December 04, 1925  Subjective/Objective:                    Action/Plan: UR completed   Expected Discharge Date:                  Expected Discharge Plan:  Home/Self Care  In-House Referral:     Discharge planning Services     Post Acute Care Choice:    Choice offered to:     DME Arranged:    DME Agency:     HH Arranged:    [place] Agency:     Status of Service:  In process, will continue to follow  Medicare Important Message Given:    Date Medicare IM Given:    Medicare IM give by:    Date Additional Medicare IM Given:    Additional Medicare Important Message give by:     If discussed at [place] of Stay Meetings, dates discussed:    Additional Comments:  Marilu Favre, RN 10/24/2014, 10:53 AM

## 2014-10-24 NOTE — Consult Note (Signed)
Patient ID: [name] MRN: [id number], DOB/AGE: Aug 19, 1925   Admit date: 10/24/2014   Primary Physician: Abigail Miyamoto, MD Primary Cardiologist: [name]  Pt. Profile:  81 y/o female with known severe stage D aortic stenosis, chronic diastolic CHF and chronic afib admitted for recurrent calculous cholecystitis. Cards asked to see for surgical clearance.   Problem List  Past Medical History  Diagnosis Date  . CKD (chronic kidney disease), stage IV 2014    a. Baseline Cr reported 2.5-2.6.  Marland Kitchen Anemia   . Urinary tract infection   . Colon cancer 1980s  . Chronic atrial fibrillation     a. not on anticoagulation due to increased risk of falls, advanced age.  . Chronic diastolic CHF (congestive heart failure)     a. Echo 10/2013: mild LVH, EF 60-65%, mod AS, mildly dilated LA, mod dilated RA, PASP 33.  Marland Kitchen Hypertension   . Carotid disease, bilateral     a. Carotid duplex 04/6710: 45-80% RICA/LICA.  Marland Kitchen H/O cardiovascular stress test     a. 2002: nuc negative for ischemia, EF 65%.  . Severe aortic stenosis 08/23/2014  . Depression with anxiety     situational  . Headache   . Arthritis   . Recurrent pleural effusion on right     s/p multiple taps prior to Pleurex catheter placement  . Choledocholithiasis with acute cholecystitis 06/14/2014  . Clostridium difficile diarrhea 09/2014  . Type II diabetes mellitus with complication 9/98/3382    Retinopathy, nepropathy.   . Thrombocytopenia 08/29/2014    Past Surgical History  Procedure Laterality Date  . Abdominal hysterectomy    . Colon surgery  2002    right colectomy for recurrent cancer  . Appendectomy    . Colostomy  1980s    low anterior resection of rectal cancer.   . Cardioversion  09/30/2011    Procedure: CARDIOVERSION;  Surgeon: [name], MD;  Location: [facility];  Service: Cardiovascular;  Laterality: N/A;  . I&d extremity  11/20/2011    Procedure: IRRIGATION AND DEBRIDEMENT EXTREMITY;  Surgeon: Alta Corning, MD;   Location: [facility];  Service: Orthopedics;  Laterality: Left;  . Av fistula placement Left 06/02/2012    Procedure: ARTERIOVENOUS (AV) FISTULA CREATION;  Surgeon: Conrad Drexel Hill, MD;  Location: [facility];  Service: Vascular;  Laterality: Left;  Bascilic Fistula  . Bascilic vein transposition Left 08/18/2012    Procedure: BASCILIC VEIN TRANSPOSITION;  Surgeon: Conrad , MD;  Location: [facility];  Service: Vascular;  Laterality: Left;  Left 2nd stage Basilic Vein Transposition   . Ercp N/A 07/11/2014    Procedure: ENDOSCOPIC RETROGRADE CHOLANGIOPANCREATOGRAPHY (ERCP);  Surgeon: Inda Castle, MD;  Location: [facility];  Service: Endoscopy;  Laterality: N/A;  . Biliary stent placement N/A 07/11/2014    Procedure: BILIARY STENT PLACEMENT;  Surgeon: Inda Castle, MD;  Location: [facility];  Service: Endoscopy;  Laterality: N/A;  . Ercp N/A 07/21/2014    Procedure: ENDOSCOPIC RETROGRADE CHOLANGIOPANCREATOGRAPHY (ERCP);  Surgeon: Inda Castle, MD;  Location: [facility];  Service: Endoscopy;  Laterality: N/A;  . Fracture surgery      Right leg  . Chest tube insertion Right 09/12/2014    Procedure: INSERTION PLEURAL DRAINAGE CATHETER;  Surgeon: Melrose Nakayama, MD;  Location: [facility] OR;  Service: Thoracic;  Laterality: Right;  . Ct perc cholecystostomy  05/11/2014     Allergies  Allergies  Allergen Reactions  . Levaquin [Levofloxacin] Nausea Only  . Codeine  Nausea Only  . Keflet [Cephalexin] Nausea Only  . Morphine And Related Nausea Only  . Oxycodone Other (See Comments)    Abnormal behavior    HPI  81 year-old female, followed by [name] and [name] ,with Stage D severe symptomatic aortic stenosis amongst multiple other comorbid medical conditions which include, Stage IV CKD with a nonfunctioning AV fistula, chronic right pleural effusion with an indwelling drainage catheter, debilitation/weakness, rectal CA: s/p LAR/colostomy 1980s; recurrent cancer with right colectomy 2002,   Insulin-dependent Type 2 DM, bilateral carotid artery disease and chronic atrial fibrillation, not on anticoagulation because of fall risk.  She has been seen by [name] and Dr. Roxy Manns for evaluation for her AS. She is not a candidate for conventional surgical aortic valve replacement under any circumstances. Dr. Roxy Manns noted that TAVR could be considered in the future after she undergoes cholecystectomy.   She was hospitalized in April of this year with acalculous cholecystitis and was treated with a percutaneous drain. Since that time, she has had recurrent calculous cholecystitis. She has now been readmitted once again with RUQ pain. CT shows calcified stones in enlarged, inflamed GB c/w acute cholecystitis. She was seen by general surgery and patient now wants to undergo elective cholecystectomy, despite her risk.     Home Medications  Prior to Admission medications   Medication Sig Start Date End Date Taking? Authorizing Provider  acetaminophen (TYLENOL) 500 MG tablet Take 500 mg by mouth every 6 (six) hours as needed for mild pain.    Yes Historical Provider, MD  aspirin EC 81 MG tablet Take 81 mg by mouth at bedtime.    Yes Historical Provider, MD  calcitRIOL (ROCALTROL) 0.25 MCG capsule Take 0.25 mcg by mouth daily.  04/06/12  Yes Historical Provider, MD  carvedilol (COREG) 25 MG tablet Take 25 mg by mouth 2 (two) times daily. 09/15/14  Yes Historical Provider, MD  clotrimazole-betamethasone (LOTRISONE) lotion Apply 1 application topically daily as needed (apply to rash on stomach as needed).  05/25/14  Yes Historical Provider, MD  docusate sodium (COLACE) 100 MG capsule Take 100 mg by mouth daily as needed for constipation.    Yes Historical Provider, MD  epoetin alfa (EPOGEN,PROCRIT) 54492 UNIT/ML injection 20,000 Units. Every two weeks   Yes Historical Provider, MD  furosemide (LASIX) 80 MG tablet Take 1 tablet (80 mg total) by mouth 2 (two) times daily. 09/08/14  Yes [name], MD    HYDROcodone-acetaminophen (NORCO/VICODIN) 5-325 MG per tablet Take 1 tablet by mouth every 4 (four) hours as needed for moderate pain. 05/16/14  Yes Orson Eva, MD  insulin glargine (LANTUS) 100 UNIT/ML injection Inject 8 Units into the skin at bedtime as needed (if cbg over 200).    Yes Historical Provider, MD  linagliptin (TRADJENTA) 5 MG TABS tablet Take 5 mg by mouth daily.    Yes Historical Provider, MD  LORazepam (ATIVAN) 0.5 MG tablet Take 0.5 mg by mouth 2 (two) times daily.   Yes Historical Provider, MD  meclizine (ANTIVERT) 12.5 MG tablet Take 12.5 mg by mouth as needed for dizziness.  11/23/12  Yes Historical Provider, MD  mirtazapine (REMERON) 7.5 MG tablet Take 7.5 mg by mouth at bedtime.  09/28/12  Yes Historical Provider, MD  promethazine (PHENERGAN) 12.5 MG tablet Take 1 tablet (12.5 mg total) by mouth every 6 (six) hours as needed for nausea or vomiting. 03/14/14  Yes Ripudeep Krystal Eaton, MD  saccharomyces boulardii (FLORASTOR) 250 MG capsule Take 1 capsule (  250 mg total) by mouth 2 (two) times daily. 09/29/14  Yes Florencia Reasons, MD  simvastatin (ZOCOR) 20 MG tablet TAKE 1 TABLET (20 MG TOTAL) BY MOUTH AT BEDTIME. 08/28/14  Yes [name], MD  traMADol (ULTRAM) 50 MG tablet Take 1 tablet (50 mg total) by mouth every 6 (six) hours as needed for moderate pain. 09/12/14  Yes Melrose Nakayama, MD  vitamin B-12 (CYANOCOBALAMIN) 1000 MCG tablet Take 1,000 mcg by mouth daily.   Yes Historical Provider, MD    Family History  Family History  Problem Relation Age of Onset  . Heart disease Father   . Diabetes Sister   . Diabetes Daughter   . Hyperlipidemia Daughter   . Hypertension Daughter   . Other Daughter     varicose veins  . Cancer Son   . Diabetes Son   . Heart disease Son     before age 41  . Hyperlipidemia Son   . Hypertension Son   . Heart attack Father   . Stroke Father     Social History  History   Social History  . Marital Status: Widowed    Spouse Name: N/A  . Number  of Children: N/A  . Years of Education: N/A   Occupational History  . Not on file.   Social History Main Topics  . Smoking status: Never Smoker   . Smokeless tobacco: Current User    Types: Chew  . Alcohol Use: No  . Drug Use: No  . Sexual Activity: No   Other Topics Concern  . Not on file   Social History Narrative     Review of Systems General:  No chills, fever, night sweats or weight changes.  Cardiovascular:  No chest pain, dyspnea on exertion, edema, orthopnea, palpitations, paroxysmal nocturnal dyspnea. Dermatological: No rash, lesions/masses Respiratory: No cough, dyspnea Urologic: No hematuria, dysuria Abdominal:   No nausea, vomiting, diarrhea, bright red blood per rectum, melena, or hematemesis Neurologic:  No visual changes, wkns, changes in mental status. All other systems reviewed and are otherwise negative except as noted above.  Physical Exam  Blood pressure 118/59, pulse 65, temperature 98.5 F (36.9 C), temperature source Oral, resp. rate 16, SpO2 100 %.  General: Pleasant, NAD Psych: Normal affect. Neuro: Alert and oriented X 3. Moves all extremities spontaneously. HEENT: Normal  Neck: Supple with + bilateral bruits (radiation of murmur) no JVD. Lungs:  Resp regular and unlabored, CTA. Heart: IRIRR , 3/6 SM loudest at RUSB. Abdomen: + RUQ tenderness, + BS.  Extremities: No clubbing, cyanosis or edema. DP/PT/Radials 2+ and equal bilaterally.  Labs  Troponin (Point of Care Test) No results for input(s): TROPIPOC in the last 72 hours. No results for input(s): CKTOTAL, CKMB, TROPONINI in the last 72 hours. Lab Results  Component Value Date   WBC 11.8* 10/24/2014   HGB 11.8* 10/24/2014   HCT 38.7 10/24/2014   MCV 94.6 10/24/2014   PLT 141* 10/24/2014    Recent Labs Lab 10/24/14 0500  NA 135  K 3.8  CL 97*  CO2 27  BUN 54*  CREATININE 3.03*  CALCIUM 8.6*  PROT 6.0*  BILITOT 0.8  ALKPHOS 117  ALT 8*  AST 17  GLUCOSE 207*   Lab  Results  Component Value Date   CHOL 87 07/14/2014   HDL 36.30* 06/15/2014   LDLCALC 29 06/15/2014   TRIG 136.0 06/15/2014   No results found for: DDIMER   Radiology/Studies  Ct Abdomen Pelvis Wo Contrast  10/24/2014   CLINICAL DATA:  81 year old female with right flank/right upper quadrant pain for 2 days. Nausea and vomiting. Drain in place to drain fluid from lungs and abdomen. Subsequent encounter.  EXAM: CT ABDOMEN AND PELVIS WITHOUT CONTRAST  TECHNIQUE: Multidetector CT imaging of the abdomen and pelvis was performed following the standard protocol without IV contrast.  COMPARISON:  10/24/2014 plain film exam.  09/22/2014 CT.  FINDINGS: Inferior right chest tube is in place with decrease in size but incomplete clearance of right-sided hydro pneumothorax.  Very small left-sided pleural effusion with basilar atelectasis.  Enlarged gallbladder contains small calcified stones with surrounding inflammation suggestive of acute cholecystitis.  Pneumobilia once again noted in the common bile duct which may be related to prior sphincterotomy. No calcified common bile duct stone visualized.  Post colectomy with surgical clips surrounding the cecum/right colon and colostomy left lower abdomen with large parastomal hernia containing small and large bowel. Previously stomach partially entered the hernia but has been reduced. No free intraperitoneal air.  Cardiomegaly. Mitral valve and aortic valve calcifications. Coronary artery calcifications.  Atherosclerotic type changes aorta with mild ectasia. Atherosclerotic type changes iliac arteries.  Right kidney rotated anteriorly. No renal or ureteral obstructing stone. Left upper pole 1.2 cm cyst. Calcification left hilum may be vascular in origin.  Taking into account limitation by non contrast imaging, no worrisome hepatic, splenic, pancreatic or adrenal lesion.  Post hysterectomy.  No urinary bladder abnormality noted.  Heterogeneous bone marrow as previously  noted. Cannot exclude metastatic disease myeloma. Schmorl's node deformities. Spinal stenosis most notable L4-5.  Scattered normal to top-normal size lymph nodes.  IMPRESSION: Enlarged gallbladder contains small calcified stones with surrounding inflammation suggestive of acute cholecystitis.  Pneumobilia once again noted in the common bile duct which may be related to prior sphincterotomy. No calcified common bile duct stone visualized.  Colostomy left lower abdomen with large parastomal hernia containing small and large bowel.  Inferior right chest tube is in place with decrease in size but incomplete clearance of right-sided hydro pneumothorax.  Very small left-sided pleural effusion with basilar atelectasis.  Cardiomegaly. Mitral valve and aortic valve calcifications. Coronary artery calcifications.  Atherosclerotic type changes aorta, aortic branch vessels and iliac arteries.  Heterogeneous bone marrow as previously noted. Cannot exclude metastatic disease or myeloma. Schmorl's node deformities. Spinal stenosis most notable L4-5.  These results were called by telephone at the time of interpretation on 10/24/2014 at 7:10 am to Dr. Mingo Amber , who verbally acknowledged these results.   Electronically Signed   By: Genia Del M.D.   On: 10/24/2014 07:22   Dg Chest 2 View  10/03/2014   CLINICAL DATA:  Right pleural drainage catheter inserted on September 12, 2014 4 multiloculated hydro pneumothorax ; experiencing some shortness of breath today  EXAM: CHEST  2 VIEW  COMPARISON:  Portable chest x-ray of September 27, 2014 and PA and lateral chest x-ray of September 12, 2014.  FINDINGS: There has been marked improvement in the appearance of the pulmonary interstitium bilaterally. The cardiac silhouette remains enlarged. The pulmonary vascularity is less engorged than on the previous study. On the right there are at least 2 small air-fluid levels anteriorly in the mid hemi thorax. There is an air-fluid level at the right lung base  posteriorly. The small caliber chest tube has its tip projecting over the posterior aspect of the right fourth rib. The bony thorax exhibits no acute abnormality.  IMPRESSION: There has been considerable interval improvement in the appearance of  the chest since the previous study, but there remain loculated fluid collections in the anterior aspect of the mid right hemithorax and posteriorly in the inferior aspect of the thorax. The chest tube tip lies superiorly in the upper right pleural space posteriorly.   Electronically Signed   By: David  Martinique M.D.   On: 10/03/2014 12:37   Dg Chest Port 1 View  09/27/2014   CLINICAL DATA:  81 year old female with shortness of breath  EXAM: PORTABLE CHEST - 1 VIEW  COMPARISON:  Chest radiograph dated 09/25/2014  FINDINGS: Single-view of the chest demonstrate bilateral mid to lower lung field airspace opacities, increased compared to the prior study. All there are small bilateral pleural effusions. The cardiac for is are silhouetted. The osseous structures are grossly unremarkable.  IMPRESSION: Interval progression of the bilateral mid to lower lung field airspace opacities.   Electronically Signed   By: Anner Crete M.D.   On: 09/27/2014 18:59   Dg Chest Port 1 View  09/25/2014   CLINICAL DATA:  Nausea, vomiting, shortness of Breath  EXAM: PORTABLE CHEST - 1 VIEW  COMPARISON:  09/22/2014  FINDINGS: Right PleurX catheter in place. There are small bilateral pleural effusions, right slightly greater than left. Left effusion is new since prior study. Bibasilar atelectasis or infiltrates, right greater than left. This is increased since prior study. Heart is likely upper limits normal in size.  IMPRESSION: Small bilateral pleural effusions with bibasilar atelectasis or infiltrates, right greater than left. Right PleurX catheter again noted.   Electronically Signed   By: Rolm Baptise M.D.   On: 09/25/2014 10:30   Dg Abd Acute W/chest  10/24/2014   CLINICAL DATA:  RIGHT  lower quadrant pain beginning 2 days ago, nausea and vomiting. Pleural effusion.  EXAM: DG ABDOMEN ACUTE W/ 1V CHEST  COMPARISON:  Chest radiograph October 03, 2014  FINDINGS: RIGHT pleural fat extending to the lung apex, with decrease, smaller loose decidual pleural effusion. Cardiac silhouette is moderately enlarged unchanged. Calcified aortic knob. Similar chronic moderate bronchitic changes without focal consolidation. No pneumothorax. Soft tissue planes included osseous structure nonsuspicious, mild degenerative change of the thoracic spine.  Paucity of bowel gas. Surgical clips along LEFT abdomen associated with apparent large hernia, the lateral aspect not fully imaged. The hernia sac appears to contain multiple loops of bowel. No intra-abdominal mass effect or pathologic calcifications. No intraperitoneal free air on the decubitus view. Moderate degenerative change of the lumbar spine.  IMPRESSION: RIGHT chest tube, small residual RIGHT pleural effusion, improved.  Stable cardiomegaly and moderate chronic bronchitic changes.  Large suspected LEFT lateral abdominal wall hernia containing bowel without bowel obstruction.   Electronically Signed   By: Elon Alas M.D.   On: 10/24/2014 05:16    ECG  Chronic atrial fibrillation with a CVR   ASSESSMENT AND PLAN  Active Problems:   Acute cholecystitis   Severe AS   1. Choledocholithiasis with acute cholecystitis: she has failed conservative measures and now presents with recurrence. She wishes to proceed with lap chole. Given her multiple medical problems including severe Stage D aortic stenosis, she is high risk, however it is a risk that she states she is willing to take. In addition, per Dr. Roxy Manns, he will not consider her for TAVR unless she undergoes a cholecystectomy.   2. Severe AS: plans for possible TAVR. Followed by Dr. Roxy Manns and [name]  3. Chronic Atrial Fibrillation: rate controled on Coreg. Not an a/c candidate due to high fall  risk.   4. Chronic Diastolic CHF: EF 55-73% on recent echo 08/23/14. Volume appears stable. Monitor volume status closely.    Signed, Lyda Jester, PA-C 10/24/2014, 2:35 PM  I have personally seen and examined this patient with Lyda Jester, PA-C. I agree with the assessment and plan as outlined above. She has known severe AS and is being considered for TAVR but this is being delayed while awaiting definitive treatment of her cholecystitis. I agree that she should proceed with her planned surgical procedure at this time. While at high risk, I think she will do well with general anesthesia. No ischemic cardiac evaluation before her surgery. She is currently euvolemic. Would ensure adequate fluid loading prior to her procedure. We will follow with you.   Zita Ozimek 10/24/2014 4:03 PM

## 2014-10-24 NOTE — Progress Notes (Signed)
ANTIBIOTIC CONSULT NOTE - INITIAL  Pharmacy Consult:  Zosyn Indication:  Intra-abdominal infection  Allergies  Allergen Reactions  . Levaquin [Levofloxacin] Nausea Only  . Codeine Nausea Only  . Keflet [Cephalexin] Nausea Only  . Morphine And Related Nausea Only  . Oxycodone Other (See Comments)    Abnormal behavior    Patient Measurements: Height = 63 inches Weight = 75.3 kg  Vital Signs: Temp: 98.5 F (36.9 C) (08/02 0851) Temp Source: Oral (08/02 0851) BP: 118/59 mmHg (08/02 0851) Pulse Rate: 65 (08/02 0851)  Labs:  Recent Labs  10/24/14 0500  WBC 11.8*  HGB 11.8*  PLT 141*  CREATININE 3.03*   Estimated Creatinine Clearance: 13 mL/min (by C-G formula based on Cr of 3.03). No results for input(s): VANCOTROUGH, VANCOPEAK, VANCORANDOM, GENTTROUGH, GENTPEAK, GENTRANDOM, TOBRATROUGH, TOBRAPEAK, TOBRARND, AMIKACINPEAK, AMIKACINTROU, AMIKACIN in the last 72 hours.   Microbiology: Recent Results (from the past 720 hour(s))  Culture, Urine     Status: None   Collection Time: 09/28/14 10:34 PM  Result Value Ref Range Status   Specimen Description URINE, RANDOM  Final   Special Requests NONE  Final   Culture >=100,000 COLONIES/mL ESCHERICHIA COLI  Final   Report Status 10/01/2014 FINAL  Final   Organism ID, Bacteria ESCHERICHIA COLI  Final      Susceptibility   Escherichia coli - MIC*    AMPICILLIN <=2 SENSITIVE Sensitive     CEFAZOLIN <=4 SENSITIVE Sensitive     CEFTRIAXONE <=1 SENSITIVE Sensitive     CIPROFLOXACIN <=0.25 SENSITIVE Sensitive     GENTAMICIN <=1 SENSITIVE Sensitive     IMIPENEM <=0.25 SENSITIVE Sensitive     NITROFURANTOIN <=16 SENSITIVE Sensitive     TRIMETH/SULFA <=20 SENSITIVE Sensitive     AMPICILLIN/SULBACTAM <=2 SENSITIVE Sensitive     PIP/TAZO <=4 SENSITIVE Sensitive     * >=100,000 COLONIES/mL ESCHERICHIA COLI    Medical History: Past Medical History  Diagnosis Date  . CKD (chronic kidney disease), stage IV 2014    a. Baseline Cr  reported 2.5-2.6.  Marland Kitchen Anemia   . Urinary tract infection   . Colon cancer 1980s  . Chronic atrial fibrillation     a. not on anticoagulation due to increased risk of falls, advanced age.  . Chronic diastolic CHF (congestive heart failure)     a. Echo 10/2013: mild LVH, EF 60-65%, mod AS, mildly dilated LA, mod dilated RA, PASP 33.  Marland Kitchen Hypertension   . Carotid disease, bilateral     a. Carotid duplex 10/5025: 74-12% RICA/LICA.  Marland Kitchen H/O cardiovascular stress test     a. 2002: nuc negative for ischemia, EF 65%.  . Severe aortic stenosis 08/23/2014  . Depression with anxiety     situational  . Headache   . Arthritis   . Recurrent pleural effusion on right     s/p multiple taps prior to Pleurex catheter placement  . Choledocholithiasis with acute cholecystitis 06/14/2014  . Clostridium difficile diarrhea 09/2014  . Type II diabetes mellitus with complication 8/78/6767    Retinopathy, nepropathy.   . Thrombocytopenia 08/29/2014      Assessment: 55 YOF with history of ERCP in April presented with nausea and RUQ abdominal pain.  CT showed acute cholecystitis and Pharmacy consulted to initiate Zosyn for intra-abdominal infection.  Patient with CKD4 and recently had an AV fistula placed.   Goal of Therapy:  Resolution of infection   Plan:  - Zosyn 2.25gm IV Q8H - Monitor renal fxn, clinical progress  Raylee Adamec D. Mina Marble, PharmD, BCPS Pager:  [phone] 10/24/2014, 10:33 AM

## 2014-10-24 NOTE — Consult Note (Signed)
[facility] Consult: 8:49 AM 10/24/2014  LOS: 0 days    Referring Provider: ED MD: Dr Mingo Amber.  Primary Care Physician:  Abigail Miyamoto, MD Primary Gastroenterologist:  Dr. Amedeo Plenty. Dr Deatra Ina as of /2016.     Reason for Consultation:  Acute cholcystitis.    HPI: [name] is a 81 y.o. female.  Hx HTN, DM, CKD IV (s/p 06/03/14 AV fistula placement), Severe Aortic Stenosis (Dr Roxy Manns would consider TAVR if GB is removed and she continues to improve) , A fib (no AC due to risk bleeding/fall), CHF, s/p Pleurex 09/12/14 for recurrent pleural effusion (drainage has dwindled and Dr Roxan Hockey likely to remove tube next week), rectal CA: s/p LAR/colostomy 1980s; recurrent cancer with right colectomy 2002.  C diff treated with oral vanco 09/2014.   Choledocholithiasis and acute cholecystitis: s/p perc cholecystostomy 04/2014 - 07/24/14.  S/p 07/11/14 ERCP/sphinct/removal of retained stones.  S/p 07/20/14 diagnostic ERCP: no retained stones, excellent emptying of bile duct.   In ED now with 3 days RUQ pain (7/10), nausea and clear mucoid "spitting up".  Pain accelerates with deep breathing, does not radiate.  Chills but no fever, which is not new.  Previously having episodic, short-lived nausea but overall fair po intake, no abdominal pain.  CT showing calcified stones in enlarged, inflamed GB c/w acute cholecystitis (change from 09/22/14 CT), pneumobilia, large bowel-containing parastomal hernia.  Heterogenous bone marrow/ can't exclude mets vs myeloma.  LFTs with normal T bil, alk phos and ALT.  AST 58. Historically, in 04/2014, the alk phos was 160 but otherwise normal LFTs.  Albumin consistently low.     Past Medical History  Diagnosis Date  . CKD (chronic kidney disease), stage IV 2014    a. Baseline Cr reported 2.5-2.6.  Marland Kitchen  Anemia   . Urinary tract infection   . Colon cancer 1980s  . Chronic atrial fibrillation     a. not on anticoagulation due to increased risk of falls, advanced age.  . Chronic diastolic CHF (congestive heart failure)     a. Echo 10/2013: mild LVH, EF 60-65%, mod AS, mildly dilated LA, mod dilated RA, PASP 33.  Marland Kitchen Hypertension   . Carotid disease, bilateral     a. Carotid duplex 04/2631: 35-45% RICA/LICA.  Marland Kitchen H/O cardiovascular stress test     a. 2002: nuc negative for ischemia, EF 65%.  . Severe aortic stenosis 08/23/2014  . Depression with anxiety     situational  . Headache   . Arthritis   . Recurrent pleural effusion on right     s/p multiple taps prior to Pleurex catheter placement  . Choledocholithiasis with acute cholecystitis 06/14/2014  . Clostridium difficile diarrhea 09/2014  . Type II diabetes mellitus with complication 09/16/6387    Retinopathy, nepropathy.     Past Surgical History  Procedure Laterality Date  . Abdominal hysterectomy    . Colon surgery  2002    right colectomy for recurrent cancer  . Appendectomy    . Colostomy  1980s    low anterior resection of rectal  cancer.   . Cardioversion  09/30/2011    Procedure: CARDIOVERSION;  Surgeon: Quintella Reichert, MD;  Location: [facility] OR;  Service: Cardiovascular;  Laterality: N/A;  . I&d extremity  11/20/2011    Procedure: IRRIGATION AND DEBRIDEMENT EXTREMITY;  Surgeon: Harvie Junior, MD;  Location: [facility] OR;  Service: Orthopedics;  Laterality: Left;  . Av fistula placement Left 06/02/2012    Procedure: ARTERIOVENOUS (AV) FISTULA CREATION;  Surgeon: Fransisco Hertz, MD;  Location: [facility] OR;  Service: Vascular;  Laterality: Left;  Bascilic Fistula  . Bascilic vein transposition Left 08/18/2012    Procedure: BASCILIC VEIN TRANSPOSITION;  Surgeon: Fransisco Hertz, MD;  Location: [facility] OR;  Service: Vascular;  Laterality: Left;  Left 2nd stage Basilic Vein Transposition   . Ercp N/A 07/11/2014    Procedure: ENDOSCOPIC RETROGRADE  CHOLANGIOPANCREATOGRAPHY (ERCP);  Surgeon: Louis Meckel, MD;  Location: [facility];  Service: Endoscopy;  Laterality: N/A;  . Biliary stent placement N/A 07/11/2014    Procedure: BILIARY STENT PLACEMENT;  Surgeon: Louis Meckel, MD;  Location: [facility]COPY;  Service: Endoscopy;  Laterality: N/A;  . Ercp N/A 07/21/2014    Procedure: ENDOSCOPIC RETROGRADE CHOLANGIOPANCREATOGRAPHY (ERCP);  Surgeon: Louis Meckel, MD;  Location: [facility];  Service: Endoscopy;  Laterality: N/A;  . Fracture surgery      Right leg  . Chest tube insertion Right 09/12/2014    Procedure: INSERTION PLEURAL DRAINAGE CATHETER;  Surgeon: Loreli Slot, MD;  Location: [facility] OR;  Service: Thoracic;  Laterality: Right;  . Ct perc cholecystostomy  05/11/2014    Prior to Admission medications   Medication Sig Start Date End Date Taking? Authorizing Provider  acetaminophen (TYLENOL) 500 MG tablet Take 500 mg by mouth every 6 (six) hours as needed for mild pain.    Yes Historical Provider, MD  aspirin EC 81 MG tablet Take 81 mg by mouth at bedtime.    Yes Historical Provider, MD  calcitRIOL (ROCALTROL) 0.25 MCG capsule Take 0.25 mcg by mouth daily.  04/06/12  Yes Historical Provider, MD  carvedilol (COREG) 25 MG tablet Take 25 mg by mouth 2 (two) times daily. 09/15/14  Yes Historical Provider, MD  clotrimazole-betamethasone (LOTRISONE) lotion Apply 1 application topically daily as needed (apply to rash on stomach as needed).  05/25/14  Yes Historical Provider, MD  docusate sodium (COLACE) 100 MG capsule Take 100 mg by mouth daily as needed for constipation.    Yes Historical Provider, MD  epoetin alfa (EPOGEN,PROCRIT) 68215 UNIT/ML injection 20,000 Units. Every two weeks   Yes Historical Provider, MD  furosemide (LASIX) 80 MG tablet Take 1 tablet (80 mg total) by mouth 2 (two) times daily. 09/08/14  Yes Quintella Reichert, MD  HYDROcodone-acetaminophen (NORCO/VICODIN) 5-325 MG per tablet Take 1 tablet by mouth every 4 (four)  hours as needed for moderate pain. 05/16/14  Yes Catarina Hartshorn, MD  insulin glargine (LANTUS) 100 UNIT/ML injection Inject 8 Units into the skin at bedtime as needed (if cbg over 200).    Yes Historical Provider, MD  linagliptin (TRADJENTA) 5 MG TABS tablet Take 5 mg by mouth daily.    Yes Historical Provider, MD  LORazepam (ATIVAN) 0.5 MG tablet Take 0.5 mg by mouth 2 (two) times daily.   Yes Historical Provider, MD  meclizine (ANTIVERT) 12.5 MG tablet Take 12.5 mg by mouth as needed for dizziness.  11/23/12  Yes Historical Provider, MD  mirtazapine (REMERON) 7.5 MG tablet Take 7.5 mg by mouth at bedtime.  09/28/12  Yes Historical Provider, MD  promethazine (PHENERGAN) 12.5 MG tablet Take 1 tablet (12.5 mg total) by mouth every 6 (six) hours as needed for nausea or vomiting. 03/14/14  Yes Ripudeep Krystal Eaton, MD  saccharomyces boulardii (FLORASTOR) 250 MG capsule Take 1 capsule (250 mg total) by mouth 2 (two) times daily. 09/29/14  Yes Florencia Reasons, MD  simvastatin (ZOCOR) 20 MG tablet TAKE 1 TABLET (20 MG TOTAL) BY MOUTH AT BEDTIME. 08/28/14  Yes [name], MD  traMADol (ULTRAM) 50 MG tablet Take 1 tablet (50 mg total) by mouth every 6 (six) hours as needed for moderate pain. 09/12/14  Yes Melrose Nakayama, MD  vitamin B-12 (CYANOCOBALAMIN) 1000 MCG tablet Take 1,000 mcg by mouth daily.   Yes Historical Provider, MD    Scheduled Meds:   Infusions:   PRN Meds: ondansetron (ZOFRAN) IV   Allergies as of 10/24/2014 - Review Complete 10/24/2014  Allergen Reaction Noted  . Levaquin [levofloxacin] Nausea Only 07/11/2014  . Codeine Nausea Only 06/02/2012  . Keflet [cephalexin] Nausea Only 01/17/2012  . Morphine and related Nausea Only 06/02/2012  . Oxycodone Other (See Comments) 12/22/2011    Family History  Problem Relation Age of Onset  . Heart disease Father   . Diabetes Sister   . Diabetes Daughter   . Hyperlipidemia Daughter   . Hypertension Daughter   . Other Daughter     varicose veins  .  Cancer Son   . Diabetes Son   . Heart disease Son     before age 36  . Hyperlipidemia Son   . Hypertension Son   . Heart attack Father   . Stroke Father     History   Social History  . Marital Status: Widowed    Spouse Name: N/A  . Number of Children: N/A  . Years of Education: N/A   Occupational History  . Not on file.   Social History Main Topics  . Smoking status: Never Smoker   . Smokeless tobacco: Current User    Types: Chew  . Alcohol Use: No  . Drug Use: No  . Sexual Activity: No   Other Topics Concern  . Not on file   Social History Narrative    REVIEW OF SYSTEMS: Constitutional:  Family reports stable weight. No excessive fatigue or malaise. ENT:  No nose bleeds Pulm:  No significant DOE, though activity is limited. No cough. Drainage from Pleurx catheter is minimal to none. CV:  No palpitations, no LE edema. No chest pain GU:  No hematuria, no frequency, no oliguria GI:  Per HPI Heme:  Patient has periodically been treated with iron infusions at the outpatient [facility].  She also receives epoetin injections every 2 weeks. Transfusions:  She may have had transfusions in the remote past but nothing recently. Neuro:  No headaches, no peripheral tingling or numbness.  No falls. She does use a walker to ambulate around the house. Derm:  No itching, no rash or sores.  Endocrine:    No polyuria or dysuria Immunization:  Did not inquire. Travel:  None beyond local counties in last few months.    PHYSICAL EXAM: Vital signs in last 24 hours: Filed Vitals:   10/24/14 0802  BP: 119/42  Pulse: 71  Temp:   Resp:    Wt Readings from Last 3 Encounters:  10/23/14 166 lb (75.297 kg)  10/16/14 167 lb (75.751 kg)  10/13/14 179 lb 3.2 oz (81.285 kg)    General: Pleasant, elderly WF.  Patient does not appear ill. She appears surprisingly well considering her history. Head:  No asymmetry or swelling.  Eyes:  No icterus, conjunctiva pale. EOMI. Ears:   Slightly HOH  Nose:  No congestion or discharge Mouth:  Moist, clear MM. Upper full dentures. Neck:  No JVD, no masses, no thyromegaly. Lungs:  Pleurx catheter is hidden beneath the bandage in the right upper quadrant. Few crackles in the lower lobes bilaterally. No dyspnea, no cough. Heart: RRR with a harsh, loud murmur. S1-S2 audible. Abdomen:  Obese. Soft, tender diffusely but especially in the right abdomen. Bowel sounds quiet. Not distended. Did not palpate abdomen deeply, thus masses and organomegaly not appreciated.. Bandage overlying pleurx catheter in RUQ, ostomy in lateral left LQ with formed stool.    Rectal:  Deferred   Musc/Skeltl:  No joint erythema, contracture deformity or swelling. Extremities:  No CCE. Feet are warm.  Neurologic:  Patient oriented 3, appropriate.  Quiet. Moves all 4 limbs, strength not tested. No tremor. Skin:  No jaundice Tattoos:  None Nodes:  No cervical or inguinal adenopathy.   Psych:  Affect normal. Calm.  Intake/Output from previous day:   Intake/Output this shift:    LAB RESULTS:  Recent Labs  10/24/14 0500  WBC 11.8*  HGB 11.8*  HCT 38.7  PLT 141*   BMET Lab Results  Component Value Date   NA 135 10/24/2014   NA 142 10/20/2014   NA 137 09/28/2014   K 3.8 10/24/2014   K 3.6 10/20/2014   K 4.1 09/28/2014   CL 97* 10/24/2014   CL 102 10/20/2014   CL 106 09/28/2014   CO2 27 10/24/2014   CO2 26 10/20/2014   CO2 24 09/28/2014   GLUCOSE 207* 10/24/2014   GLUCOSE 204* 10/20/2014   GLUCOSE 91 09/28/2014   BUN 54* 10/24/2014   BUN 48* 10/20/2014   BUN 41* 09/28/2014   CREATININE 3.03* 10/24/2014   CREATININE 2.97* 10/20/2014   CREATININE 2.98* 09/28/2014   CALCIUM 8.6* 10/24/2014   CALCIUM 8.9 10/20/2014   CALCIUM 9.0 10/20/2014   LFT  Recent Labs  10/24/14 0500  PROT 6.0*  ALBUMIN 2.7*  AST 17  ALT 8*  ALKPHOS 117  BILITOT 0.8   PT/INR Lab Results  Component Value Date   INR 1.29 09/12/2014   INR 1.47  05/11/2014   INR 1.20 03/15/2014   Hepatitis Panel No results for input(s): HEPBSAG, HCVAB, HEPAIGM, HEPBIGM in the last 72 hours. C-Diff No components found for: CDIFF Lipase     Component Value Date/Time   LIPASE 58* 10/24/2014 0500    Drugs of Abuse     Component Value Date/Time   LABOPIA NONE DETECTED 05/16/2012 1245   COCAINSCRNUR NONE DETECTED 05/16/2012 1245   LABBENZ POSITIVE* 05/16/2012 1245   AMPHETMU NONE DETECTED 05/16/2012 1245   THCU NONE DETECTED 05/16/2012 1245   LABBARB NONE DETECTED 05/16/2012 1245     RADIOLOGY STUDIES: Ct Abdomen Pelvis Wo Contrast  10/24/2014   CLINICAL DATA:  81 year old female with right flank/right upper quadrant pain for 2 days. Nausea and vomiting. Drain in place to drain fluid from lungs and abdomen. Subsequent encounter.  EXAM: CT ABDOMEN AND PELVIS WITHOUT CONTRAST  TECHNIQUE: Multidetector CT imaging of the abdomen and pelvis was performed following the standard protocol without IV contrast.  COMPARISON:  10/24/2014 plain film exam.  09/22/2014 CT.  FINDINGS: Inferior right chest tube is in place with decrease in size but incomplete clearance of right-sided hydro pneumothorax.  Very small left-sided pleural effusion with basilar atelectasis.  Enlarged gallbladder contains small calcified stones with surrounding inflammation suggestive of acute cholecystitis.  Pneumobilia once again noted in the common bile duct which may be related to prior sphincterotomy. No calcified common bile duct stone visualized.  Post colectomy with surgical clips surrounding the cecum/right colon and colostomy left lower abdomen with large parastomal hernia containing small and large bowel. Previously stomach partially entered the hernia but has been reduced. No free intraperitoneal air.  Cardiomegaly. Mitral valve and aortic valve calcifications. Coronary artery calcifications.  Atherosclerotic type changes aorta with mild ectasia. Atherosclerotic type changes iliac  arteries.  Right kidney rotated anteriorly. No renal or ureteral obstructing stone. Left upper pole 1.2 cm cyst. Calcification left hilum may be vascular in origin.  Taking into account limitation by non contrast imaging, no worrisome hepatic, splenic, pancreatic or adrenal lesion.  Post hysterectomy.  No urinary bladder abnormality noted.  Heterogeneous bone marrow as previously noted. Cannot exclude metastatic disease myeloma. Schmorl's node deformities. Spinal stenosis most notable L4-5.  Scattered normal to top-normal size lymph nodes.  IMPRESSION: Enlarged gallbladder contains small calcified stones with surrounding inflammation suggestive of acute cholecystitis.  Pneumobilia once again noted in the common bile duct which may be related to prior sphincterotomy. No calcified common bile duct stone visualized.  Colostomy left lower abdomen with large parastomal hernia containing small and large bowel.  Inferior right chest tube is in place with decrease in size but incomplete clearance of right-sided hydro pneumothorax.  Very small left-sided pleural effusion with basilar atelectasis.  Cardiomegaly. Mitral valve and aortic valve calcifications. Coronary artery calcifications.  Atherosclerotic type changes aorta, aortic branch vessels and iliac arteries.  Heterogeneous bone marrow as previously noted. Cannot exclude metastatic disease or myeloma. Schmorl's node deformities. Spinal stenosis most notable L4-5.  These results were called by telephone at the time of interpretation on 10/24/2014 at 7:10 am to Dr. Mingo Amber , who verbally acknowledged these results.   Electronically Signed   By: Genia Del M.D.   On: 10/24/2014 07:22   Dg Abd Acute W/chest  10/24/2014   CLINICAL DATA:  RIGHT lower quadrant pain beginning 2 days ago, nausea and vomiting. Pleural effusion.  EXAM: DG ABDOMEN ACUTE W/ 1V CHEST  COMPARISON:  Chest radiograph October 03, 2014  FINDINGS: RIGHT pleural fat extending to the lung apex, with  decrease, smaller loose decidual pleural effusion. Cardiac silhouette is moderately enlarged unchanged. Calcified aortic knob. Similar chronic moderate bronchitic changes without focal consolidation. No pneumothorax. Soft tissue planes included osseous structure nonsuspicious, mild degenerative change of the thoracic spine.  Paucity of bowel gas. Surgical clips along LEFT abdomen associated with apparent large hernia, the lateral aspect not fully imaged. The hernia sac appears to contain multiple loops of bowel. No intra-abdominal mass effect or pathologic calcifications. No intraperitoneal free air on the decubitus view. Moderate degenerative change of the lumbar spine.  IMPRESSION: RIGHT chest tube, small residual RIGHT pleural effusion, improved.  Stable cardiomegaly and moderate chronic bronchitic changes.  Large suspected LEFT lateral abdominal wall hernia containing bowel without bowel obstruction.   Electronically Signed   By: Elon Alas M.D.   On: 10/24/2014 05:16    ENDOSCOPIC STUDIES: Per history of present illness. Last colonoscopy with Dr Amedeo Plenty ~ 2011 or 2012  IMPRESSION:   *  Recurrent acute cholecystitis.  Complicated, aged pt who has already undergone perc chole tube in 04/2014 -07/2014 and ERCP/sphinc/stone removal 06/2014  *  Multiple severe comorbidities  as per HPI  *  Non-critical thrombocytopenia dating back to 08/29/2014.  *  Hx rectal cancer and LAR, ostomy.  Right colon cancer and right colectomy 2002.  Latest colonoscopy per Dr Amedeo Plenty ~ 2011/2012 per family recall.     PLAN:     *  Await general surgery recommendation.  Do not see role for endoscopic procedures.    Azucena Freed  10/24/2014, 8:49 AM Pager: [phone]     ________________________________________________________________________  Velora Heckler GI MD note:  I personally examined the patient, reviewed the data and agree with the assessment and plan described above.  She understands that decision is lap  chole vs. Perc chole drain. Had drain in past, was removed 2 months ago. She is pretty clear that she does not prefer another drain be placed.  She also understands that she will have higher than normal risks for surgery given age, comorbid conditions.  Will follow along.   Owens Loffler, MD [facility] Pager [phone]

## 2014-10-24 NOTE — H&P (Signed)
[facility] History and Physical  [name] FGH:[id number] DOB: 1925-12-12 DOA: 10/24/2014  Referring physician:  PCP: Abigail Miyamoto, MD  Specialists:   Chief Complaint: nausea, RUQ abdominal pain   HPI: [name] is a 81 y.o. female with PMH of HTN, DM, CKD IV, Severe Aortic Stenosis, A fib (no on AC due to risk bleeding/fall), CHF recurrent pleural effusion s/p Pleurex, h/o Colon CA colostomy, choledocholithiasis with acute cholecystitis Biliary drainage perc cholecystostomy tube (04/2014) and ERCP (06/2014), recent C diff infection (complete oral vanc 2 weeks ago) presented with RUQ pain for 2-3 days, associate with nausea, but no vomiting. Patient reports right sided 7/10 abdominal pains with no significant radiation, intermittent in nature. She denies fever, but had some chills. No vomiting, no diarrhea, no SOB, no chest pains.  -ED: CT abdomen showed Enlarged gallbladder contains small calcified stones with surrounding inflammation suggestive of acute cholecystitis. Awaiting call back from GI, and surgery. hospitalist is asked for admission   Review of Systems: The patient denies anorexia, fever,  vision loss, decreased hearing, hoarseness, chest pain, syncope, dyspnea on exertion, peripheral edema, balance deficits, hemoptysis, melena, hematochezia, severe indigestion/heartburn, hematuria, incontinence, genital sores, muscle weakness, suspicious skin lesions, transient blindness, difficulty walking, depression, unusual weight change, abnormal bleeding, enlarged lymph nodes, angioedema, and breast masses.    Past Medical History  Diagnosis Date  . CKD (chronic kidney disease), stage IV     a. Baseline Cr reported 2.5-2.6.  Marland Kitchen Anxiety   . Diabetes mellitus     diabetic retinopathy  . Anemia   . Urinary tract infection   . Fall at home   . Colostomy care     colon ca - h/o bloody output from bag 12/2012 (ED visit)  . Colon cancer 30 year ago  . Chronic atrial fibrillation      a. not on anticoagulation due to increased risk of falls, advanced age.  . Chronic diastolic CHF (congestive heart failure)     a. Echo 10/2013: mild LVH, EF 60-65%, mod AS, mildly dilated LA, mod dilated RA, PASP 33.  Marland Kitchen Hypertension   . Carotid disease, bilateral     a. Carotid duplex 08/7891: 81-01% RICA/LICA.  Marland Kitchen H/O cardiovascular stress test     a. 2002: nuc negative for ischemia, EF 65%.  . Shortness of breath dyspnea   . Severe aortic stenosis 08/23/2014  . Heart murmur   . Depression     situational  . Headache   . Arthritis   . Recurrent pleural effusion on right   . Chronic kidney disease (CKD), stage IV (severe) 04/27/2012  . Choledocholithiasis with acute cholecystitis 06/14/2014  . Clostridium difficile diarrhea   . Type II diabetes mellitus with complication 7/51/0258   Past Surgical History  Procedure Laterality Date  . Abdominal surgery    . Abdominal hysterectomy    . Colon surgery    . Appendectomy    . Colostomy    . Cardioversion  09/30/2011    Procedure: CARDIOVERSION;  Surgeon: [name], MD;  Location: [place];  Service: Cardiovascular;  Laterality: N/A;  . I&d extremity  11/20/2011    Procedure: IRRIGATION AND DEBRIDEMENT EXTREMITY;  Surgeon: Alta Corning, MD;  Location: [place] [facility];  Service: Orthopedics;  Laterality: Left;  . Av fistula placement Left 06/02/2012    Procedure: ARTERIOVENOUS (AV) FISTULA CREATION;  Surgeon: Conrad Kerman, MD;  Location: [place];  Service: Vascular;  Laterality: Left;  Bascilic Fistula  . Bascilic  vein transposition Left 08/18/2012    Procedure: BASCILIC VEIN TRANSPOSITION;  Surgeon: Conrad Prichard, MD;  Location: [place];  Service: Vascular;  Laterality: Left;  Left 2nd stage Basilic Vein Transposition   . Ercp N/A 07/11/2014    Procedure: ENDOSCOPIC RETROGRADE CHOLANGIOPANCREATOGRAPHY (ERCP);  Surgeon: Inda Castle, MD;  Location: [facility];  Service: Endoscopy;  Laterality: N/A;  . Biliary stent placement N/A 07/11/2014     Procedure: BILIARY STENT PLACEMENT;  Surgeon: Inda Castle, MD;  Location: [facility];  Service: Endoscopy;  Laterality: N/A;  . Ercp N/A 07/21/2014    Procedure: ENDOSCOPIC RETROGRADE CHOLANGIOPANCREATOGRAPHY (ERCP);  Surgeon: Inda Castle, MD;  Location: [facility];  Service: Endoscopy;  Laterality: N/A;  . Fracture surgery      Right leg  . Chest tube insertion Right 09/12/2014    Procedure: INSERTION PLEURAL DRAINAGE CATHETER;  Surgeon: Melrose Nakayama, MD;  Location: [facility];  Service: Thoracic;  Laterality: Right;  . Ct perc cholecystostomy  05/11/2014   Social History:  reports that she has never smoked. Her smokeless tobacco use includes Chew. She reports that she does not drink alcohol or use illicit drugs. Home;  where does patient live--home, ALF, SNF? and with whom if at home? Yes;  Can patient participate in ADLs?  Allergies  Allergen Reactions  . Levaquin [Levofloxacin] Nausea Only  . Codeine Nausea Only  . Keflet [Cephalexin] Nausea Only  . Morphine And Related Nausea Only  . Oxycodone Other (See Comments)    Abnormal behavior    Family History  Problem Relation Age of Onset  . Heart disease Father   . Diabetes Sister   . Diabetes Daughter   . Hyperlipidemia Daughter   . Hypertension Daughter   . Other Daughter     varicose veins  . Cancer Son   . Diabetes Son   . Heart disease Son     before age 78  . Hyperlipidemia Son   . Hypertension Son   . Heart attack Father   . Stroke Father     (be sure to complete)  Prior to Admission medications   Medication Sig Start Date End Date Taking? Authorizing Provider  acetaminophen (TYLENOL) 500 MG tablet Take 500 mg by mouth every 6 (six) hours as needed for mild pain.    Yes Historical Provider, MD  aspirin EC 81 MG tablet Take 81 mg by mouth at bedtime.    Yes Historical Provider, MD  calcitRIOL (ROCALTROL) 0.25 MCG capsule Take 0.25 mcg by mouth daily.  04/06/12  Yes Historical Provider, MD  carvedilol  (COREG) 25 MG tablet Take 25 mg by mouth 2 (two) times daily. 09/15/14  Yes Historical Provider, MD  clotrimazole-betamethasone (LOTRISONE) lotion Apply 1 application topically daily as needed (apply to rash on stomach as needed).  05/25/14  Yes Historical Provider, MD  docusate sodium (COLACE) 100 MG capsule Take 100 mg by mouth daily as needed for constipation.    Yes Historical Provider, MD  epoetin alfa (EPOGEN,PROCRIT) 76720 UNIT/ML injection 20,000 Units. Every two weeks   Yes Historical Provider, MD  furosemide (LASIX) 80 MG tablet Take 1 tablet (80 mg total) by mouth 2 (two) times daily. 09/08/14  Yes [name], MD  HYDROcodone-acetaminophen (NORCO/VICODIN) 5-325 MG per tablet Take 1 tablet by mouth every 4 (four) hours as needed for moderate pain. 05/16/14  Yes Orson Eva, MD  insulin glargine (LANTUS) 100 UNIT/ML injection Inject 8 Units into the skin at bedtime as needed (  if cbg over 200).    Yes Historical Provider, MD  linagliptin (TRADJENTA) 5 MG TABS tablet Take 5 mg by mouth daily.    Yes Historical Provider, MD  LORazepam (ATIVAN) 0.5 MG tablet Take 0.5 mg by mouth 2 (two) times daily.   Yes Historical Provider, MD  meclizine (ANTIVERT) 12.5 MG tablet Take 12.5 mg by mouth as needed for dizziness.  11/23/12  Yes Historical Provider, MD  mirtazapine (REMERON) 7.5 MG tablet Take 7.5 mg by mouth at bedtime.  09/28/12  Yes Historical Provider, MD  promethazine (PHENERGAN) 12.5 MG tablet Take 1 tablet (12.5 mg total) by mouth every 6 (six) hours as needed for nausea or vomiting. 03/14/14  Yes Ripudeep Krystal Eaton, MD  saccharomyces boulardii (FLORASTOR) 250 MG capsule Take 1 capsule (250 mg total) by mouth 2 (two) times daily. 09/29/14  Yes Florencia Reasons, MD  simvastatin (ZOCOR) 20 MG tablet TAKE 1 TABLET (20 MG TOTAL) BY MOUTH AT BEDTIME. 08/28/14  Yes [name], MD  traMADol (ULTRAM) 50 MG tablet Take 1 tablet (50 mg total) by mouth every 6 (six) hours as needed for moderate pain. 09/12/14  Yes Melrose Nakayama, MD  vitamin B-12 (CYANOCOBALAMIN) 1000 MCG tablet Take 1,000 mcg by mouth daily.   Yes Historical Provider, MD   Physical Exam: Filed Vitals:   10/24/14 0700  BP: 131/43  Pulse: 68  Temp:   Resp: 16     General:  Alert. No distress   Eyes: eom-i  ENT: no oral ulcers   Neck: supple, no jvd  Cardiovascular: G8,Z6 systolic ejection murmur   Respiratory: BL LL few crackles. + pleurex   Abdomen: soft, RUQ tender. No rebound, + colostomy   Skin: no rash   Musculoskeletal: mild leg edema  Psychiatric: no hallucinations   Neurologic: CN 2-12 intact. Motor 5/5 BL symmetric   Labs on Admission:  Basic Metabolic Panel:  Recent Labs Lab 10/20/14 1525 10/20/14 1526 10/24/14 0500  NA 142  --  135  K 3.6  --  3.8  CL 102  --  97*  CO2 26  --  27  GLUCOSE 204*  --  207*  BUN 48*  --  54*  CREATININE 2.97*  --  3.03*  CALCIUM 9.0 8.9 8.6*  PHOS 4.6  --   --    Liver Function Tests:  Recent Labs Lab 10/20/14 1525 10/24/14 0500  AST  --  17  ALT  --  8*  ALKPHOS  --  117  BILITOT  --  0.8  PROT  --  6.0*  ALBUMIN 2.9* 2.7*    Recent Labs Lab 10/24/14 0500  LIPASE 58*   No results for input(s): AMMONIA in the last 168 hours. CBC:  Recent Labs Lab 10/20/14 1451 10/24/14 0500  WBC  --  11.8*  NEUTROABS  --  9.7*  HGB 11.7* 11.8*  HCT  --  38.7  MCV  --  94.6  PLT  --  141*   Cardiac Enzymes: No results for input(s): CKTOTAL, CKMB, CKMBINDEX, TROPONINI in the last 168 hours.  BNP (last 3 results)  Recent Labs  03/15/14 0914 07/14/14 1125  BNP 941.4* 526.1*    ProBNP (last 3 results)  Recent Labs  09/08/14 1517  PROBNP 312.0*    CBG: No results for input(s): GLUCAP in the last 168 hours.  Radiological Exams on Admission: Ct Abdomen Pelvis Wo Contrast  10/24/2014   CLINICAL DATA:  81 year old female with right flank/right  upper quadrant pain for 2 days. Nausea and vomiting. Drain in place to drain fluid from lungs and  abdomen. Subsequent encounter.  EXAM: CT ABDOMEN AND PELVIS WITHOUT CONTRAST  TECHNIQUE: Multidetector CT imaging of the abdomen and pelvis was performed following the standard protocol without IV contrast.  COMPARISON:  10/24/2014 plain film exam.  09/22/2014 CT.  FINDINGS: Inferior right chest tube is in place with decrease in size but incomplete clearance of right-sided hydro pneumothorax.  Very small left-sided pleural effusion with basilar atelectasis.  Enlarged gallbladder contains small calcified stones with surrounding inflammation suggestive of acute cholecystitis.  Pneumobilia once again noted in the common bile duct which may be related to prior sphincterotomy. No calcified common bile duct stone visualized.  Post colectomy with surgical clips surrounding the cecum/right colon and colostomy left lower abdomen with large parastomal hernia containing small and large bowel. Previously stomach partially entered the hernia but has been reduced. No free intraperitoneal air.  Cardiomegaly. Mitral valve and aortic valve calcifications. Coronary artery calcifications.  Atherosclerotic type changes aorta with mild ectasia. Atherosclerotic type changes iliac arteries.  Right kidney rotated anteriorly. No renal or ureteral obstructing stone. Left upper pole 1.2 cm cyst. Calcification left hilum may be vascular in origin.  Taking into account limitation by non contrast imaging, no worrisome hepatic, splenic, pancreatic or adrenal lesion.  Post hysterectomy.  No urinary bladder abnormality noted.  Heterogeneous bone marrow as previously noted. Cannot exclude metastatic disease myeloma. Schmorl's node deformities. Spinal stenosis most notable L4-5.  Scattered normal to top-normal size lymph nodes.  IMPRESSION: Enlarged gallbladder contains small calcified stones with surrounding inflammation suggestive of acute cholecystitis.  Pneumobilia once again noted in the common bile duct which may be related to prior  sphincterotomy. No calcified common bile duct stone visualized.  Colostomy left lower abdomen with large parastomal hernia containing small and large bowel.  Inferior right chest tube is in place with decrease in size but incomplete clearance of right-sided hydro pneumothorax.  Very small left-sided pleural effusion with basilar atelectasis.  Cardiomegaly. Mitral valve and aortic valve calcifications. Coronary artery calcifications.  Atherosclerotic type changes aorta, aortic branch vessels and iliac arteries.  Heterogeneous bone marrow as previously noted. Cannot exclude metastatic disease or myeloma. Schmorl's node deformities. Spinal stenosis most notable L4-5.  These results were called by telephone at the time of interpretation on 10/24/2014 at 7:10 am to Dr. Mingo Amber , who verbally acknowledged these results.   Electronically Signed   By: Genia Del M.D.   On: 10/24/2014 07:22   Dg Abd Acute W/chest  10/24/2014   CLINICAL DATA:  RIGHT lower quadrant pain beginning 2 days ago, nausea and vomiting. Pleural effusion.  EXAM: DG ABDOMEN ACUTE W/ 1V CHEST  COMPARISON:  Chest radiograph October 03, 2014  FINDINGS: RIGHT pleural fat extending to the lung apex, with decrease, smaller loose decidual pleural effusion. Cardiac silhouette is moderately enlarged unchanged. Calcified aortic knob. Similar chronic moderate bronchitic changes without focal consolidation. No pneumothorax. Soft tissue planes included osseous structure nonsuspicious, mild degenerative change of the thoracic spine.  Paucity of bowel gas. Surgical clips along LEFT abdomen associated with apparent large hernia, the lateral aspect not fully imaged. The hernia sac appears to contain multiple loops of bowel. No intra-abdominal mass effect or pathologic calcifications. No intraperitoneal free air on the decubitus view. Moderate degenerative change of the lumbar spine.  IMPRESSION: RIGHT chest tube, small residual RIGHT pleural effusion, improved.  Stable  cardiomegaly and moderate chronic bronchitic changes.  Large suspected LEFT lateral abdominal wall hernia containing bowel without bowel obstruction.   Electronically Signed   By: Elon Alas M.D.   On: 10/24/2014 05:16    EKG: not done   Assessment/Plan Active Problems:   Acute cholecystitis   Choledocholithiasis with acute cholecystitis   81 y.o. female with PMH of HTN, DM, CKD IV, Severe Aortic Stenosis, A fib (no on AC due to risk bleeding/fall), CHF recurrent pleural effusion s/p Pleurex, h/o Colon CA colostomy, choledocholithiasis with acute cholecystitis Biliary drainage perc cholecystostomy tube (04/2014) and ERCP (06/2014), recent C diff infection (complete oral vanc 2 weeks ago) presented with RUQ pain for 2-3 days, associate with nausea, but no vomiting. -admitted with acute cholecystitis. H/o recurrent choledocholithiasis  1. Acute cholecystitis. H/o cholecystitis, choledocholithiasis with Biliary drainage perc cholecystostomy tube (04/2014) and ERCP (06/2014) -CT abdomen: Enlarged gallbladder contains small calcified stones with surrounding inflammation suggestive ofacute cholecystitis -we will keep NPO, gentle IVF (monitor fluid balance closely due to CKD, Aortic stenosis), start IV zosyn. Consulted GI, and surgery. Patient will likely need cholecystectomy due to recurrent cholecystitis. But she remains at high risk for surgery. Consulted cardiology preop evaluation   2. CHF recurrent pleural effusion s/p Pleurex, Severe Aortic Stenosis. Echo (08/2014). LVEF: 65-70%. Severe aortic stenosis with Mean gradient (S): 34 mm Hg. -Patient does not appear in acute CHF. We will cont monitor closely fluid balance, I/O, daily weight. Cont diuresis oral  3. A fib (no on AC due to risk bleeding/fall). No tachycardia on exam. We will obtain ECG, cont BB, ASA 4. CKD IV. Patient with chronic edema. Creatinine is close to baseline. Cont oral diuresis with lasix 5. DM. Cont ISS for now      Preoperative optimization:  Patient with rcri score >3, sever aortic stenosis, advanced ckd, aging seem to be at high preoperative risk for surgery. We asked cardiology evaluation.   D/w patient, confirmed with her daughters. Patient is DNR  Cardiology, GI, surgeyr.  if consultant consulted, please document name and whether formally or informally consulted  Code Status: DNR (must indicate code status--if unknown or must be presumed, indicate so) Family Communication: d/w patient, her daughter  (indicate person spoken with, if applicable, with phone number if by telephone) Disposition Plan: clinical improvement  (indicate anticipated LOS)  Time spent: >45 minutes   Shandrell Boda N [facility] Please contact on 8/2 from 7AM to 7 PM at [phone]   If 7PM-7AM, please contact night-coverage [email or web address] Password [facility] 10/24/2014, 7:58 AM

## 2014-10-24 NOTE — Consult Note (Signed)
[facility] Surgery Consult Note  [name] 06-28-1925  [id number].    Requesting MD: Dr. Mingo Amber Chief Complaint/Reason for Consult: Recurrent calculous cholecystitis  HPI:  81 y/o white Female with PMH HTN, DM, CKD IV (s/p 08/19/14 AV fistula placement), Severe Aortic Stenosis (Dr Roxy Manns would consider TAVR if GB is removed) , AFIB (no AC due to risk bleeding/fall), CHF, s/p Pleurex 09/12/14 for recurrent pleural effusion (pending possible removal), rectal CA: s/p LAR/colostomy 1980s; recurrent cancer with right colectomy 2002. C diff treated with oral vanco 09/2014.  She has a h/o Choledocholithiasis and acute cholecystitis s/p IR perc cholecystostomy 05/11/14. S/p 07/11/14 ERCP/sphinct/removal of retained stones. S/p 07/20/14 diagnostic ERCP: no retained stones, excellent emptying of bile duct. Perc chole was subsequently removed by IR on 07/24/14.  She has been fine in this regards until Saturday 10/21/14.  [facility]14 with 3 days RUQ pain (7/10), nausea, dry heaving, and clear mucoid phlegm. Pain accelerates with deep breathing, does not radiate. Stoma working well.  No vomiting, no dysuria, no constipation/diarrhea, no CP and SOB improved, no fevers/chills, but chronically cold.  Previously GB episodes consisted of episodic nausea but overall fair PO intake, without abdominal pain. Now her pain is quite severe much worse than ever before.  Unknown trigger.  No alleviating factors.  CT showing calcified stones in enlarged, inflamed GB c/w acute cholecystitis (change from 09/22/14 CT), pneumobilia, large bowel-containing parastomal hernia. Heterogenous bone marrow/ can't exclude mets vs myeloma. LFTs with normal T bil, alk phos and ALT. AST 58.  Historically, in 04/2014, the alk phos was 160, and bili was 1.4, but otherwise normal LFTs. WBC is 11.8.     ROS: All systems reviewed and otherwise negative except for as above  Family History  Problem Relation Age of Onset   . Heart disease Father   . Diabetes Sister   . Diabetes Daughter   . Hyperlipidemia Daughter   . Hypertension Daughter   . Other Daughter     varicose veins  . Cancer Son   . Diabetes Son   . Heart disease Son     before age 26  . Hyperlipidemia Son   . Hypertension Son   . Heart attack Father   . Stroke Father     Past Medical History  Diagnosis Date  . CKD (chronic kidney disease), stage IV 2014    a. Baseline Cr reported 2.5-2.6.  Marland Kitchen Anemia   . Urinary tract infection   . Colon cancer 1980s  . Chronic atrial fibrillation     a. not on anticoagulation due to increased risk of falls, advanced age.  . Chronic diastolic CHF (congestive heart failure)     a. Echo 10/2013: mild LVH, EF 60-65%, mod AS, mildly dilated LA, mod dilated RA, PASP 33.  Marland Kitchen Hypertension   . Carotid disease, bilateral     a. Carotid duplex 09/6732: 19-37% RICA/LICA.  Marland Kitchen H/O cardiovascular stress test     a. 2002: nuc negative for ischemia, EF 65%.  . Severe aortic stenosis 08/23/2014  . Depression with anxiety     situational  . Headache   . Arthritis   . Recurrent pleural effusion on right     s/p multiple taps prior to Pleurex catheter placement  . Choledocholithiasis with acute cholecystitis 06/14/2014  . Clostridium difficile diarrhea 09/2014  . Type II diabetes mellitus with complication 11/24/4095    Retinopathy, nepropathy.     Past Surgical History  Procedure Laterality Date  .  Abdominal hysterectomy    . Colon surgery  2002    right colectomy for recurrent cancer  . Appendectomy    . Colostomy  1980s    low anterior resection of rectal cancer.   . Cardioversion  09/30/2011    Procedure: CARDIOVERSION;  Surgeon: [name], MD;  Location: [place];  Service: Cardiovascular;  Laterality: N/A;  . I&d extremity  11/20/2011    Procedure: IRRIGATION AND DEBRIDEMENT EXTREMITY;  Surgeon: Alta Corning, MD;  Location: [place];  Service: Orthopedics;  Laterality: Left;  . Av fistula placement Left  06/02/2012    Procedure: ARTERIOVENOUS (AV) FISTULA CREATION;  Surgeon: Conrad Hamilton, MD;  Location: [place];  Service: Vascular;  Laterality: Left;  Bascilic Fistula  . Bascilic vein transposition Left 08/18/2012    Procedure: BASCILIC VEIN TRANSPOSITION;  Surgeon: Conrad Branson, MD;  Location: [place];  Service: Vascular;  Laterality: Left;  Left 2nd stage Basilic Vein Transposition   . Ercp N/A 07/11/2014    Procedure: ENDOSCOPIC RETROGRADE CHOLANGIOPANCREATOGRAPHY (ERCP);  Surgeon: Inda Castle, MD;  Location: [facility];  Service: Endoscopy;  Laterality: N/A;  . Biliary stent placement N/A 07/11/2014    Procedure: BILIARY STENT PLACEMENT;  Surgeon: Inda Castle, MD;  Location: [facility];  Service: Endoscopy;  Laterality: N/A;  . Ercp N/A 07/21/2014    Procedure: ENDOSCOPIC RETROGRADE CHOLANGIOPANCREATOGRAPHY (ERCP);  Surgeon: Inda Castle, MD;  Location: [facility];  Service: Endoscopy;  Laterality: N/A;  . Fracture surgery      Right leg  . Chest tube insertion Right 09/12/2014    Procedure: INSERTION PLEURAL DRAINAGE CATHETER;  Surgeon: Melrose Nakayama, MD;  Location: [place];  Service: Thoracic;  Laterality: Right;  . Ct perc cholecystostomy  05/11/2014    Social History:  reports that she has never smoked. Her smokeless tobacco use includes Chew. She reports that she does not drink alcohol or use illicit drugs.  Allergies:  Allergies  Allergen Reactions  . Levaquin [Levofloxacin] Nausea Only  . Codeine Nausea Only  . Keflet [Cephalexin] Nausea Only  . Morphine And Related Nausea Only  . Oxycodone Other (See Comments)    Abnormal behavior    Medications Prior to Admission  Medication Sig Dispense Refill  . acetaminophen (TYLENOL) 500 MG tablet Take 500 mg by mouth every 6 (six) hours as needed for mild pain.     Marland Kitchen aspirin EC 81 MG tablet Take 81 mg by mouth at bedtime.     . calcitRIOL (ROCALTROL) 0.25 MCG capsule Take 0.25 mcg by mouth daily.     . carvedilol  (COREG) 25 MG tablet Take 25 mg by mouth 2 (two) times daily.  3  . clotrimazole-betamethasone (LOTRISONE) lotion Apply 1 application topically daily as needed (apply to rash on stomach as needed).   0  . docusate sodium (COLACE) 100 MG capsule Take 100 mg by mouth daily as needed for constipation.     Marland Kitchen epoetin alfa (EPOGEN,PROCRIT) 95284 UNIT/ML injection 20,000 Units. Every two weeks    . furosemide (LASIX) 80 MG tablet Take 1 tablet (80 mg total) by mouth 2 (two) times daily. 60 tablet 3  . HYDROcodone-acetaminophen (NORCO/VICODIN) 5-325 MG per tablet Take 1 tablet by mouth every 4 (four) hours as needed for moderate pain. 20 tablet 0  . insulin glargine (LANTUS) 100 UNIT/ML injection Inject 8 Units into the skin at bedtime as needed (if cbg over 200).     Marland Kitchen linagliptin (TRADJENTA) 5 MG  TABS tablet Take 5 mg by mouth daily.     Marland Kitchen LORazepam (ATIVAN) 0.5 MG tablet Take 0.5 mg by mouth 2 (two) times daily.    . meclizine (ANTIVERT) 12.5 MG tablet Take 12.5 mg by mouth as needed for dizziness.     . mirtazapine (REMERON) 7.5 MG tablet Take 7.5 mg by mouth at bedtime.     . promethazine (PHENERGAN) 12.5 MG tablet Take 1 tablet (12.5 mg total) by mouth every 6 (six) hours as needed for nausea or vomiting. 30 tablet 0  . saccharomyces boulardii (FLORASTOR) 250 MG capsule Take 1 capsule (250 mg total) by mouth 2 (two) times daily. 60 capsule 0  . simvastatin (ZOCOR) 20 MG tablet TAKE 1 TABLET (20 MG TOTAL) BY MOUTH AT BEDTIME. 30 tablet 5  . traMADol (ULTRAM) 50 MG tablet Take 1 tablet (50 mg total) by mouth every 6 (six) hours as needed for moderate pain. 30 tablet 0  . vitamin B-12 (CYANOCOBALAMIN) 1000 MCG tablet Take 1,000 mcg by mouth daily.      Blood pressure 118/59, pulse 65, temperature 98.5 F (36.9 C), temperature source Oral, resp. rate 16, SpO2 100 %. Physical Exam: General: pleasant, WD/WN white female who is laying in bed in NAD HEENT: head is normocephalic, atraumatic.  Sclera are  noninjected.  PERRL.  Ears and nose without any masses or lesions.  Mouth is pink and moist Heart: regular, rate, and rhythm.  Obvious loud murmur (AS).  Palpable pedal pulses bilaterally Lungs: CTAB, no wheezes, rhonchi, or rales noted.  Respiratory effort nonlabored Abd: Obese, soft, ND, tender over pleural drain in RUQ and epigastrium, +BS, no masses, hernias, or organomegaly, extensive open abdominal scars noted.  Left sided colostomy patent with brown stool/flatus in bag MS: all 4 extremities are symmetrical with no cyanosis, clubbing.  Chronic skin changes to LE, minimal edema. Skin: warm and dry with no masses, lesions, or rashes Psych: A&Ox3 with an appropriate affect.   Results for orders placed or performed during the hospital encounter of 10/24/14 (from the past 48 hour(s))  Comprehensive metabolic panel     Status: Abnormal   Collection Time: 10/24/14  5:00 AM  Result Value Ref Range   Sodium 135 135 - 145 mmol/L   Potassium 3.8 3.5 - 5.1 mmol/L   Chloride 97 (L) 101 - 111 mmol/L   CO2 27 22 - 32 mmol/L   Glucose, Bld 207 (H) 65 - 99 mg/dL   BUN 54 (H) 6 - 20 mg/dL   Creatinine, Ser 3.03 (H) 0.44 - 1.00 mg/dL   Calcium 8.6 (L) 8.9 - 10.3 mg/dL   Total Protein 6.0 (L) 6.5 - 8.1 g/dL   Albumin 2.7 (L) 3.5 - 5.0 g/dL   AST 17 15 - 41 U/L   ALT 8 (L) 14 - 54 U/L   Alkaline Phosphatase 117 38 - 126 U/L   Total Bilirubin 0.8 0.3 - 1.2 mg/dL   GFR calc non Af Amer 13 (L) >60 mL/min   GFR calc Af Amer 15 (L) >60 mL/min    Comment: (NOTE) The eGFR has been calculated using the CKD EPI equation. This calculation has not been validated in all clinical situations. eGFR's persistently <60 mL/min signify possible Chronic Kidney Disease.    Anion gap 11 5 - 15  CBC with Differential     Status: Abnormal   Collection Time: 10/24/14  5:00 AM  Result Value Ref Range   WBC 11.8 (H) 4.0 - 10.5 K/uL  RBC 4.09 3.87 - 5.11 MIL/uL   Hemoglobin 11.8 (L) 12.0 - 15.0 g/dL   HCT 38.7 36.0  - 46.0 %   MCV 94.6 78.0 - 100.0 fL   MCH 28.9 26.0 - 34.0 pg   MCHC 30.5 30.0 - 36.0 g/dL   RDW 15.9 (H) 11.5 - 15.5 %   Platelets 141 (L) 150 - 400 K/uL   Neutrophils Relative % 82 (H) 43 - 77 %   Neutro Abs 9.7 (H) 1.7 - 7.7 K/uL   Lymphocytes Relative 10 (L) 12 - 46 %   Lymphs Abs 1.2 0.7 - 4.0 K/uL   Monocytes Relative 8 3 - 12 %   Monocytes Absolute 0.9 0.1 - 1.0 K/uL   Eosinophils Relative 0 0 - 5 %   Eosinophils Absolute 0.0 0.0 - 0.7 K/uL   Basophils Relative 0 0 - 1 %   Basophils Absolute 0.0 0.0 - 0.1 K/uL  Lipase, blood     Status: Abnormal   Collection Time: 10/24/14  5:00 AM  Result Value Ref Range   Lipase 58 (H) 22 - 51 U/L  I-Stat CG4 Lactic Acid, ED     Status: None   Collection Time: 10/24/14  5:03 AM  Result Value Ref Range   Lactic Acid, Venous 1.31 0.5 - 2.0 mmol/L  Urinalysis, Routine w reflex microscopic (not at [facility])     Status: Abnormal   Collection Time: 10/24/14  6:03 AM  Result Value Ref Range   Color, Urine YELLOW YELLOW   APPearance CLOUDY (A) CLEAR   Specific Gravity, Urine 1.008 1.005 - 1.030   pH 5.5 5.0 - 8.0   Glucose, UA NEGATIVE NEGATIVE mg/dL   Hgb urine dipstick NEGATIVE NEGATIVE   Bilirubin Urine NEGATIVE NEGATIVE   Ketones, ur NEGATIVE NEGATIVE mg/dL   Protein, ur NEGATIVE NEGATIVE mg/dL   Urobilinogen, UA 0.2 0.0 - 1.0 mg/dL   Nitrite NEGATIVE NEGATIVE   Leukocytes, UA SMALL (A) NEGATIVE  Urine microscopic-add on     Status: Abnormal   Collection Time: 10/24/14  6:03 AM  Result Value Ref Range   Squamous Epithelial / LPF FEW (A) RARE   WBC, UA 7-10 <3 WBC/hpf   RBC / HPF 0-2 <3 RBC/hpf   Bacteria, UA MANY (A) RARE   Ct Abdomen Pelvis Wo Contrast  10/24/2014   CLINICAL DATA:  81 year old female with right flank/right upper quadrant pain for 2 days. Nausea and vomiting. Drain in place to drain fluid from lungs and abdomen. Subsequent encounter.  EXAM: CT ABDOMEN AND PELVIS WITHOUT CONTRAST  TECHNIQUE: Multidetector CT imaging of  the abdomen and pelvis was performed following the standard protocol without IV contrast.  COMPARISON:  10/24/2014 plain film exam.  09/22/2014 CT.  FINDINGS: Inferior right chest tube is in place with decrease in size but incomplete clearance of right-sided hydro pneumothorax.  Very small left-sided pleural effusion with basilar atelectasis.  Enlarged gallbladder contains small calcified stones with surrounding inflammation suggestive of acute cholecystitis.  Pneumobilia once again noted in the common bile duct which may be related to prior sphincterotomy. No calcified common bile duct stone visualized.  Post colectomy with surgical clips surrounding the cecum/right colon and colostomy left lower abdomen with large parastomal hernia containing small and large bowel. Previously stomach partially entered the hernia but has been reduced. No free intraperitoneal air.  Cardiomegaly. Mitral valve and aortic valve calcifications. Coronary artery calcifications.  Atherosclerotic type changes aorta with mild ectasia. Atherosclerotic type changes iliac arteries.  Right  kidney rotated anteriorly. No renal or ureteral obstructing stone. Left upper pole 1.2 cm cyst. Calcification left hilum may be vascular in origin.  Taking into account limitation by non contrast imaging, no worrisome hepatic, splenic, pancreatic or adrenal lesion.  Post hysterectomy.  No urinary bladder abnormality noted.  Heterogeneous bone marrow as previously noted. Cannot exclude metastatic disease myeloma. Schmorl's node deformities. Spinal stenosis most notable L4-5.  Scattered normal to top-normal size lymph nodes.  IMPRESSION: Enlarged gallbladder contains small calcified stones with surrounding inflammation suggestive of acute cholecystitis.  Pneumobilia once again noted in the common bile duct which may be related to prior sphincterotomy. No calcified common bile duct stone visualized.  Colostomy left lower abdomen with large parastomal hernia  containing small and large bowel.  Inferior right chest tube is in place with decrease in size but incomplete clearance of right-sided hydro pneumothorax.  Very small left-sided pleural effusion with basilar atelectasis.  Cardiomegaly. Mitral valve and aortic valve calcifications. Coronary artery calcifications.  Atherosclerotic type changes aorta, aortic branch vessels and iliac arteries.  Heterogeneous bone marrow as previously noted. Cannot exclude metastatic disease or myeloma. Schmorl's node deformities. Spinal stenosis most notable L4-5.  These results were called by telephone at the time of interpretation on 10/24/2014 at 7:10 am to Dr. Mingo Amber , who verbally acknowledged these results.   Electronically Signed   By: Genia Del M.D.   On: 10/24/2014 07:22   Dg Abd Acute W/chest  10/24/2014   CLINICAL DATA:  RIGHT lower quadrant pain beginning 2 days ago, nausea and vomiting. Pleural effusion.  EXAM: DG ABDOMEN ACUTE W/ 1V CHEST  COMPARISON:  Chest radiograph October 03, 2014  FINDINGS: RIGHT pleural fat extending to the lung apex, with decrease, smaller loose decidual pleural effusion. Cardiac silhouette is moderately enlarged unchanged. Calcified aortic knob. Similar chronic moderate bronchitic changes without focal consolidation. No pneumothorax. Soft tissue planes included osseous structure nonsuspicious, mild degenerative change of the thoracic spine.  Paucity of bowel gas. Surgical clips along LEFT abdomen associated with apparent large hernia, the lateral aspect not fully imaged. The hernia sac appears to contain multiple loops of bowel. No intra-abdominal mass effect or pathologic calcifications. No intraperitoneal free air on the decubitus view. Moderate degenerative change of the lumbar spine.  IMPRESSION: RIGHT chest tube, small residual RIGHT pleural effusion, improved.  Stable cardiomegaly and moderate chronic bronchitic changes.  Large suspected LEFT lateral abdominal wall hernia containing bowel  without bowel obstruction.   Electronically Signed   By: Elon Alas M.D.   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  Patient with recurrent calculous cholecystitis.  LFT's normal, WBC up.  Patient is wanting to be aggressive with her treatment and would like to undergo lap chole if recommended by [place].  She would not prefer to have a perc chole drain again.  She understands the risks of surgery and she's willing to take them even if it means complications which she is well aware of.  Two of her family members are at bedside and agree.  Palliative has seen the patient, back in April and she wanted to be aggressive then as well.  She is a DNR. 2.  CVTS (Dr. Roxy Manns) has seen her as an OP and recommended TAVR, but only after cholecystectomy will it be considered.  They are pending removal of her right plerual drain.   3.  She has also recently had a AV fistula placed on chronic stage 4 renal failure, Cr. 3.03 4.  NPO, IVF, pain control, antiemetics, antibiotics (Zosyn Day #1) 5.  SCD's 6.  Ambulate and IS 7.  Will discuss with Dr. Donne Hazel.  I have let them know that surgery of her gallbladder may or may not be an option and that we may recommend repeat perc drain if not felt to be a good surgical candidate.  Cardiology consult pending.   Nat Christen, [facility] Surgery 10/24/2014, 9:10 AM Pager: [phone]

## 2014-10-24 NOTE — ED Notes (Addendum)
Patient here with 2 day history of nausea, dry heaving.  Patient has a drain to drain off fluid from lungs in abdomen, has had it for over a month, has not had any problems until the last week.  Patient does have phenergan and tramadol at home but has not been taking it.  Patient was given 8mg  IV Zofran en route to ED by EMS.  Zofran has not really worked well for her nausea.  Patient also having abdominal pain with the nausea.

## 2014-10-24 NOTE — ED Notes (Signed)
Patient currently in CT °

## 2014-10-24 NOTE — Progress Notes (Signed)
[facility]  Patient Status: Active (receiving services up to time of hospitalization)  [facility] is providing the following services: RN and HHA  If patient discharges after hours, please call [phone].   [name] 10/24/2014, 5:23 PM

## 2014-10-24 NOTE — ED Provider Notes (Signed)
0700 - Care from Dr. Sharol Given. Here with RUQ pain, hx of cholecystostomy tube previously. Also has hx of pleural effusion and has pleural drainage catheter in place. Has had cholecystostomy tubes and ERCPs over the past few months by [facility], will consult them after CT showed inflammation of the gallbaldder. Zosyn administered. Will plan on admission by [facility].  I spoke with Azucena Freed with [facility] and with [facility] with General Surgery, who will both consult.  1. Cholecystitis   2. Non-intractable vomiting with nausea, vomiting of unspecified type      Evelina Bucy, MD 10/24/14 [phone]

## 2014-10-24 NOTE — ED Provider Notes (Signed)
CSN: [id number]     Arrival date & time 10/24/14  0300 History   First MD Initiated Contact with Patient 10/24/14 0406     Chief Complaint  Patient presents with  . Nausea  . Emesis     (Consider location/radiation/quality/duration/timing/severity/associated sxs/prior Treatment) HPI 81 year old female presents to the emergency department from home via EMS with complaint of worsening right upper quadrant abdominal pain with nausea and vomiting starting Saturday.  She reports symptoms resolved somewhat, but have come back.  No association with food.  She denies any fever.  No radiation of the pain.  Medical history involves chronic kidney disease, anxiety, diabetes, colostomy, chronic A. fib, aortic stenosis choledocholithiasis with cholecystitis, C. difficile diarrhea and right pleural effusion Past Medical History  Diagnosis Date  . CKD (chronic kidney disease), stage IV     a. Baseline Cr reported 2.5-2.6.  Marland Kitchen Anxiety   . Diabetes mellitus     diabetic retinopathy  . Anemia   . Urinary tract infection   . Fall at home   . Colostomy care     colon ca - h/o bloody output from bag 12/2012 (ED visit)  . Colon cancer 30 year ago  . Chronic atrial fibrillation     a. not on anticoagulation due to increased risk of falls, advanced age.  . Chronic diastolic CHF (congestive heart failure)     a. Echo 10/2013: mild LVH, EF 60-65%, mod AS, mildly dilated LA, mod dilated RA, PASP 33.  Marland Kitchen Hypertension   . Carotid disease, bilateral     a. Carotid duplex 10/2421: 53-61% RICA/LICA.  Marland Kitchen H/O cardiovascular stress test     a. 2002: nuc negative for ischemia, EF 65%.  . Shortness of breath dyspnea   . Severe aortic stenosis 08/23/2014  . Heart murmur   . Depression     situational  . Headache   . Arthritis   . Recurrent pleural effusion on right   . Chronic kidney disease (CKD), stage IV (severe) 04/27/2012  . Choledocholithiasis with acute cholecystitis 06/14/2014  . Clostridium difficile diarrhea    . Type II diabetes mellitus with complication 4/43/1540   Past Surgical History  Procedure Laterality Date  . Abdominal surgery    . Abdominal hysterectomy    . Colon surgery    . Appendectomy    . Colostomy    . Cardioversion  09/30/2011    Procedure: CARDIOVERSION;  Surgeon: [name], MD;  Location: [facility];  Service: Cardiovascular;  Laterality: N/A;  . I&d extremity  11/20/2011    Procedure: IRRIGATION AND DEBRIDEMENT EXTREMITY;  Surgeon: Alta Corning, MD;  Location: [facility];  Service: Orthopedics;  Laterality: Left;  . Av fistula placement Left 06/02/2012    Procedure: ARTERIOVENOUS (AV) FISTULA CREATION;  Surgeon: Conrad Apopka, MD;  Location: [facility];  Service: Vascular;  Laterality: Left;  Bascilic Fistula  . Bascilic vein transposition Left 08/18/2012    Procedure: BASCILIC VEIN TRANSPOSITION;  Surgeon: Conrad Bolindale, MD;  Location: [facility];  Service: Vascular;  Laterality: Left;  Left 2nd stage Basilic Vein Transposition   . Ercp N/A 07/11/2014    Procedure: ENDOSCOPIC RETROGRADE CHOLANGIOPANCREATOGRAPHY (ERCP);  Surgeon: Inda Castle, MD;  Location: [facility];  Service: Endoscopy;  Laterality: N/A;  . Biliary stent placement N/A 07/11/2014    Procedure: BILIARY STENT PLACEMENT;  Surgeon: Inda Castle, MD;  Location: [facility];  Service: Endoscopy;  Laterality: N/A;  . Ercp N/A 07/21/2014    Procedure:  ENDOSCOPIC RETROGRADE CHOLANGIOPANCREATOGRAPHY (ERCP);  Surgeon: Inda Castle, MD;  Location: [facility];  Service: Endoscopy;  Laterality: N/A;  . Fracture surgery      Right leg  . Chest tube insertion Right 09/12/2014    Procedure: INSERTION PLEURAL DRAINAGE CATHETER;  Surgeon: Melrose Nakayama, MD;  Location: [facility];  Service: Thoracic;  Laterality: Right;  . Ct perc cholecystostomy  05/11/2014   Family History  Problem Relation Age of Onset  . Heart disease Father   . Diabetes Sister   . Diabetes Daughter   . Hyperlipidemia Daughter   . Hypertension Daughter    . Other Daughter     varicose veins  . Cancer Son   . Diabetes Son   . Heart disease Son     before age 10  . Hyperlipidemia Son   . Hypertension Son   . Heart attack Father   . Stroke Father    History  Substance Use Topics  . Smoking status: Never Smoker   . Smokeless tobacco: Current User    Types: Chew  . Alcohol Use: No   OB History    No data available     Review of Systems  Gastrointestinal: Positive for nausea, vomiting, abdominal pain and abdominal distention.      Allergies  Levaquin; Codeine; Keflet; Morphine and related; and Oxycodone  Home Medications   Prior to Admission medications   Medication Sig Start Date End Date Taking? Authorizing Provider  acetaminophen (TYLENOL) 500 MG tablet Take 500 mg by mouth every 6 (six) hours as needed for mild pain.    Yes Historical Provider, MD  aspirin EC 81 MG tablet Take 81 mg by mouth at bedtime.    Yes Historical Provider, MD  calcitRIOL (ROCALTROL) 0.25 MCG capsule Take 0.25 mcg by mouth daily.  04/06/12  Yes Historical Provider, MD  carvedilol (COREG) 25 MG tablet Take 25 mg by mouth 2 (two) times daily. 09/15/14  Yes Historical Provider, MD  clotrimazole-betamethasone (LOTRISONE) lotion Apply 1 application topically daily as needed (apply to rash on stomach as needed).  05/25/14  Yes Historical Provider, MD  docusate sodium (COLACE) 100 MG capsule Take 100 mg by mouth daily as needed for constipation.    Yes Historical Provider, MD  epoetin alfa (EPOGEN,PROCRIT) 87564 UNIT/ML injection 20,000 Units. Every two weeks   Yes Historical Provider, MD  furosemide (LASIX) 80 MG tablet Take 1 tablet (80 mg total) by mouth 2 (two) times daily. 09/08/14  Yes [name], MD  HYDROcodone-acetaminophen (NORCO/VICODIN) 5-325 MG per tablet Take 1 tablet by mouth every 4 (four) hours as needed for moderate pain. 05/16/14  Yes Orson Eva, MD  insulin glargine (LANTUS) 100 UNIT/ML injection Inject 8 Units into the skin at bedtime as  needed (if cbg over 200).    Yes Historical Provider, MD  linagliptin (TRADJENTA) 5 MG TABS tablet Take 5 mg by mouth daily.    Yes Historical Provider, MD  LORazepam (ATIVAN) 0.5 MG tablet Take 0.5 mg by mouth 2 (two) times daily.   Yes Historical Provider, MD  meclizine (ANTIVERT) 12.5 MG tablet Take 12.5 mg by mouth as needed for dizziness.  11/23/12  Yes Historical Provider, MD  mirtazapine (REMERON) 7.5 MG tablet Take 7.5 mg by mouth at bedtime.  09/28/12  Yes Historical Provider, MD  promethazine (PHENERGAN) 12.5 MG tablet Take 1 tablet (12.5 mg total) by mouth every 6 (six) hours as needed for nausea or vomiting. 03/14/14  Yes Ripudeep K Rai,  MD  saccharomyces boulardii (FLORASTOR) 250 MG capsule Take 1 capsule (250 mg total) by mouth 2 (two) times daily. 09/29/14  Yes Florencia Reasons, MD  simvastatin (ZOCOR) 20 MG tablet TAKE 1 TABLET (20 MG TOTAL) BY MOUTH AT BEDTIME. 08/28/14  Yes [name], MD  traMADol (ULTRAM) 50 MG tablet Take 1 tablet (50 mg total) by mouth every 6 (six) hours as needed for moderate pain. 09/12/14  Yes Melrose Nakayama, MD  vitamin B-12 (CYANOCOBALAMIN) 1000 MCG tablet Take 1,000 mcg by mouth daily.   Yes Historical Provider, MD   BP 142/55 mmHg  Pulse 66  Temp(Src) 98.4 F (36.9 C) (Oral)  Resp 15  SpO2 100% Physical Exam  Constitutional: She is oriented to person, place, and time. She appears well-developed and well-nourished. She appears distressed.  HENT:  Head: Normocephalic and atraumatic.  Nose: Nose normal.  Mouth/Throat: Oropharynx is clear and moist.  Eyes: Conjunctivae and EOM are normal. Pupils are equal, round, and reactive to light.  Neck: Normal range of motion. Neck supple. No JVD present. No tracheal deviation present. No thyromegaly present.  Cardiovascular: Normal rate, regular rhythm, normal heart sounds and intact distal pulses.  Exam reveals no gallop and no friction rub.   No murmur heard. Pulmonary/Chest: Effort normal and breath sounds  normal. No stridor. No respiratory distress. She has no wheezes. She has no rales. She exhibits no tenderness.  Abdominal: Soft. Bowel sounds are normal. She exhibits no distension and no mass. There is tenderness (patient has significant tenderness in right upper quadrant in region of pleural drain.  There is some mild rebound and voluntary guarding). There is no rebound and no guarding.  Musculoskeletal: Normal range of motion. She exhibits no edema or tenderness.  Lymphadenopathy:    She has no cervical adenopathy.  Neurological: She is alert and oriented to person, place, and time. She displays normal reflexes. She exhibits normal muscle tone. Coordination normal.  Skin: Skin is warm. No rash noted. She is diaphoretic. No erythema. There is pallor.  Psychiatric: She has a normal mood and affect. Her behavior is normal. Judgment and thought content normal.  Nursing note and vitals reviewed.   ED Course  Procedures (including critical care time) Labs Review Labs Reviewed  COMPREHENSIVE METABOLIC PANEL - Abnormal; Notable for the following:    Chloride 97 (*)    Glucose, Bld 207 (*)    BUN 54 (*)    Creatinine, Ser 3.03 (*)    Calcium 8.6 (*)    Total Protein 6.0 (*)    Albumin 2.7 (*)    ALT 8 (*)    GFR calc non Af Amer 13 (*)    GFR calc Af Amer 15 (*)    All other components within normal limits  CBC WITH DIFFERENTIAL/PLATELET - Abnormal; Notable for the following:    WBC 11.8 (*)    Hemoglobin 11.8 (*)    RDW 15.9 (*)    Platelets 141 (*)    Neutrophils Relative % 82 (*)    Neutro Abs 9.7 (*)    Lymphocytes Relative 10 (*)    All other components within normal limits  URINALYSIS, ROUTINE W REFLEX MICROSCOPIC (NOT AT [facility]) - Abnormal; Notable for the following:    APPearance CLOUDY (*)    Leukocytes, UA SMALL (*)    All other components within normal limits  LIPASE, BLOOD - Abnormal; Notable for the following:    Lipase 58 (*)    All other components within  normal  limits  URINE MICROSCOPIC-ADD ON - Abnormal; Notable for the following:    Squamous Epithelial / LPF FEW (*)    Bacteria, UA MANY (*)    All other components within normal limits  I-STAT CG4 LACTIC ACID, ED    Imaging Review Dg Abd Acute W/chest  10/24/2014   CLINICAL DATA:  RIGHT lower quadrant pain beginning 2 days ago, nausea and vomiting. Pleural effusion.  EXAM: DG ABDOMEN ACUTE W/ 1V CHEST  COMPARISON:  Chest radiograph October 03, 2014  FINDINGS: RIGHT pleural fat extending to the lung apex, with decrease, smaller loose decidual pleural effusion. Cardiac silhouette is moderately enlarged unchanged. Calcified aortic knob. Similar chronic moderate bronchitic changes without focal consolidation. No pneumothorax. Soft tissue planes included osseous structure nonsuspicious, mild degenerative change of the thoracic spine.  Paucity of bowel gas. Surgical clips along LEFT abdomen associated with apparent large hernia, the lateral aspect not fully imaged. The hernia sac appears to contain multiple loops of bowel. No intra-abdominal mass effect or pathologic calcifications. No intraperitoneal free air on the decubitus view. Moderate degenerative change of the lumbar spine.  IMPRESSION: RIGHT chest tube, small residual RIGHT pleural effusion, improved.  Stable cardiomegaly and moderate chronic bronchitic changes.  Large suspected LEFT lateral abdominal wall hernia containing bowel without bowel obstruction.   Electronically Signed   By: Elon Alas M.D.   On: 10/24/2014 05:16     EKG Interpretation None      MDM   Final diagnoses:  None    81 year old female with intermittent right upper quadrant pain, nausea and vomiting which has been worsening.  Patient with complicated past medical history of end-stage renal disease, not yet on dialysis, pleural effusion with drain in place, cholelithiasis status post current percutaneous drain and severe aortic stenosis.  Plan for acute abdominal series,  Zofran and fentanyl.  6:36 AM Pt feeling better after pain/nausea medication.  Pleural effusion improved.  Pt has h/o cholelithiasis, ?possible recurrence of biliary colic?  Plan for noncontrast abd CT scan.  Care passed to Dr Mingo Amber awaiting CT scan results  Linton Flemings, MD 10/24/14 [phone]

## 2014-10-25 DIAGNOSIS — R112 Nausea with vomiting, unspecified: Secondary | ICD-10-CM

## 2014-10-25 LAB — GLUCOSE, CAPILLARY
Glucose-Capillary: 94 mg/dL (ref 65–99)
Glucose-Capillary: 90 mg/dL (ref 65–99)
Glucose-Capillary: 116 mg/dL — ABNORMAL HIGH (ref 65–99)
Glucose-Capillary: 104 mg/dL — ABNORMAL HIGH (ref 65–99)
Glucose-Capillary: 88 mg/dL (ref 65–99)

## 2014-10-25 LAB — BASIC METABOLIC PANEL
Anion gap: 9 (ref 5–15)
BUN: 54 mg/dL — AB (ref 6–20)
CALCIUM: 8.2 mg/dL — AB (ref 8.9–10.3)
CO2: 29 mmol/L (ref 22–32)
Chloride: 102 mmol/L (ref 101–111)
Creatinine, Ser: 3.3 mg/dL — ABNORMAL HIGH (ref 0.44–1.00)
GFR calc non Af Amer: 12 mL/min — ABNORMAL LOW (ref >60)
GFR, EST AFRICAN AMERICAN: 14 mL/min — AB (ref >60)
Glucose, Bld: 87 mg/dL (ref 65–99)
Potassium: 3.8 mmol/L (ref 3.5–5.1)
Sodium: 140 mmol/L (ref 135–145)

## 2014-10-25 LAB — CBC
HEMATOCRIT: 34.4 % — AB (ref 36.0–46.0)
Hemoglobin: 10.2 g/dL — ABNORMAL LOW (ref 12.0–15.0)
MCH: 28.9 pg (ref 26.0–34.0)
MCHC: 29.7 g/dL — ABNORMAL LOW (ref 30.0–36.0)
MCV: 97.5 fL (ref 78.0–100.0)
PLATELETS: 138 10*3/uL — AB (ref 150–400)
RBC: 3.53 MIL/uL — ABNORMAL LOW (ref 3.87–5.11)
RDW: 16.1 % — AB (ref 11.5–15.5)
WBC: 9.9 10*3/uL (ref 4.0–10.5)

## 2014-10-25 MED ORDER — LEVALBUTEROL HCL 0.63 MG/3ML IN NEBU: 0.6300 mg | INHALATION_SOLUTION | Freq: Three times a day (TID) | RESPIRATORY_TRACT | Status: DC | PRN

## 2014-10-25 MED ORDER — HEPARIN SODIUM (PORCINE) 5000 UNIT/ML IJ SOLN
5000.0000 [IU] | Freq: Three times a day (TID) | INTRAMUSCULAR | Status: DC
  Administered 2014-10-25 (×2): 5000 [IU] via SUBCUTANEOUS
  Filled 2014-10-25 (×2): qty 1

## 2014-10-25 NOTE — Progress Notes (Signed)
    Subjective: Abd feels better. No SOB.    Objective: Vital signs in last 24 hours: Temp:  [98.4 F (36.9 C)-99 F (37.2 C)] 98.7 F (37.1 C) (08/03 0504) Pulse Rate:  [65-70] 68 (08/03 0504) Resp:  [16] 16 (08/03 0504) BP: (101-118)/(50-59) 105/51 mmHg (08/03 0504) SpO2:  [97 %-100 %] 97 % (08/03 0504) Last BM Date: 10/24/14  Intake/Output from previous day: 08/02 0701 - 08/03 0700 In: 1194 [P.O.:380; I.V.:714; IV Piggyback:100] Out: 300 [Urine:300] Intake/Output this shift: Total I/O In: -  Out: 300 [Urine:300]  Medications Scheduled Meds: . aspirin EC  81 mg Oral QHS  . calcitRIOL  0.25 mcg Oral Daily  . carvedilol  25 mg Oral BID  . furosemide  80 mg Oral BID  . heparin  5,000 Units Subcutaneous 3 times per day  . insulin aspart  0-9 Units Subcutaneous 6 times per day  . LORazepam  0.5 mg Oral BID  . mirtazapine  7.5 mg Oral QHS  . piperacillin-tazobactam (ZOSYN)  IV  2.25 g Intravenous 3 times per day  . saccharomyces boulardii  250 mg Oral BID  . simvastatin  20 mg Oral q1800  . sodium chloride  3 mL Intravenous Q12H   Continuous Infusions: . sodium chloride 50 mL/hr (10/24/14 1015)   PRN Meds:.acetaminophen **OR** acetaminophen, morphine injection, ondansetron (ZOFRAN) IV  PE: General appearance: alert, cooperative and no distress Lungs: right greater than left wheezing. right crackles Heart: irregularly irregular rhythm and 3/6 sys MM Abdomen: +BS Extremities: Trace LEE Pulses: 2+ and symmetric Skin: Warm and dry Neurologic: Grossly normal  Lab Results:   Recent Labs  10/24/14 0500 10/25/14 0445  WBC 11.8* 9.9  HGB 11.8* 10.2*  HCT 38.7 34.4*  PLT 141* 138*   BMET  Recent Labs  10/24/14 0500 10/25/14 0445  NA 135 140  K 3.8 3.8  CL 97* 102  CO2 27 29  GLUCOSE 207* 87  BUN 54* 54*  CREATININE 3.03* 3.30*  CALCIUM 8.6* 8.2*      Assessment/Plan    Active Problems:    Choledocholithiasis with acute cholecystitis  Plan  for Lap Chole   Aortic stenosis, severe  Being considered for TAVR.  Followed by Dr. Roxy Manns and [name]   Chronic diastolic CHF  EF 37-90% on recent echo 08/23/14.  She appears euvolemic.     Chronic Atrial Fibrillation  Rate well controlled.  No anticoagulation secondary to fall risk.    chronic right pleural effusion with an indwelling drainage catheter  Small effusion on CXR.      Considerable amount of wheezing on exam.  Will add Xopenex nebs.    LOS: 1 day    HAGER, BRYAN PA-C 10/25/2014 8:30 AM  Personally seen and examined. Agree with above. Severe aortic stenosis Understands elevated risk for surgery Family discussion Will be monitored closely.  Tele on 13:05 labeled VT is actually artifact. There is AFIB preceding (chronic).  No signs of heart failure. Wheezing improved.  Candee Furbish, MD

## 2014-10-25 NOTE — Progress Notes (Signed)
Daily Rounding Note  10/25/2014, 11:00 AM  LOS: 1 day   SUBJECTIVE:       Tolerating clears.  Abdominal pain improved, just "sore" when she gets up or is examined. Surgery is preparing for cholecystectomy tomorrow, likely open.  OBJECTIVE:         Vital signs in last 24 hours:    Temp:  [98.4 F (36.9 C)-99 F (37.2 C)] 98.7 F (37.1 C) (08/03 0504) Pulse Rate:  [65-70] 68 (08/03 0504) Resp:  [16] 16 (08/03 0504) BP: (101-110)/(50-55) 105/51 mmHg (08/03 0504) SpO2:  [97 %-100 %] 97 % (08/03 0504) Last BM Date: 10/24/14 There were no vitals filed for this visit. General: looks well, comfortable   Heart: irreg/irreg, 3/6 harsh systolic murmer Chest: right crackles, exp wheezing.   Abdomen: soft, active BS, RUQ tender, LLQ ostomy, RUQ pleurex.   Extremities: no CCE Neuro/Psych:  Pleasant, alert, relaxed, appropriate, oriented x 3.   Intake/Output from previous day: 08/02 0701 - 08/03 0700 In: 1194 [P.O.:380; I.V.:714; IV Piggyback:100] Out: 300 [Urine:300]  Intake/Output this shift: Total I/O In: 360 [P.O.:360] Out: 300 [Urine:300]  Lab Results:  Recent Labs  10/24/14 0500 10/25/14 0445  WBC 11.8* 9.9  HGB 11.8* 10.2*  HCT 38.7 34.4*  PLT 141* 138*   BMET  Recent Labs  10/24/14 0500 10/25/14 0445  NA 135 140  K 3.8 3.8  CL 97* 102  CO2 27 29  GLUCOSE 207* 87  BUN 54* 54*  CREATININE 3.03* 3.30*  CALCIUM 8.6* 8.2*   LFT  Recent Labs  10/24/14 0500  PROT 6.0*  ALBUMIN 2.7*  AST 17  ALT 8*  ALKPHOS 117  BILITOT 0.8   PT/INR No results for input(s): LABPROT, INR in the last 72 hours. Hepatitis Panel No results for input(s): HEPBSAG, HCVAB, HEPAIGM, HEPBIGM in the last 72 hours.  Studies/Results: Ct Abdomen Pelvis Wo Contrast  10/24/2014   CLINICAL DATA:  81 year old female with right flank/right upper quadrant pain for 2 days. Nausea and vomiting. Drain in place to drain fluid from  lungs and abdomen. Subsequent encounter.  EXAM: CT ABDOMEN AND PELVIS WITHOUT CONTRAST  TECHNIQUE: Multidetector CT imaging of the abdomen and pelvis was performed following the standard protocol without IV contrast.  COMPARISON:  10/24/2014 plain film exam.  09/22/2014 CT.  FINDINGS: Inferior right chest tube is in place with decrease in size but incomplete clearance of right-sided hydro pneumothorax.  Very small left-sided pleural effusion with basilar atelectasis.  Enlarged gallbladder contains small calcified stones with surrounding inflammation suggestive of acute cholecystitis.  Pneumobilia once again noted in the common bile duct which may be related to prior sphincterotomy. No calcified common bile duct stone visualized.  Post colectomy with surgical clips surrounding the cecum/right colon and colostomy left lower abdomen with large parastomal hernia containing small and large bowel. Previously stomach partially entered the hernia but has been reduced. No free intraperitoneal air.  Cardiomegaly. Mitral valve and aortic valve calcifications. Coronary artery calcifications.  Atherosclerotic type changes aorta with mild ectasia. Atherosclerotic type changes iliac arteries.  Right kidney rotated anteriorly. No renal or ureteral obstructing stone. Left upper pole 1.2 cm cyst. Calcification left hilum may be vascular in origin.  Taking into account limitation by non contrast imaging, no worrisome hepatic, splenic, pancreatic or adrenal lesion.  Post hysterectomy.  No urinary bladder abnormality noted.  Heterogeneous bone marrow as previously noted. Cannot exclude metastatic disease myeloma. Schmorl's node deformities. Spinal  stenosis most notable L4-5.  Scattered normal to top-normal size lymph nodes.  IMPRESSION: Enlarged gallbladder contains small calcified stones with surrounding inflammation suggestive of acute cholecystitis.  Pneumobilia once again noted in the common bile duct which may be related to prior  sphincterotomy. No calcified common bile duct stone visualized.  Colostomy left lower abdomen with large parastomal hernia containing small and large bowel.  Inferior right chest tube is in place with decrease in size but incomplete clearance of right-sided hydro pneumothorax.  Very small left-sided pleural effusion with basilar atelectasis.  Cardiomegaly. Mitral valve and aortic valve calcifications. Coronary artery calcifications.  Atherosclerotic type changes aorta, aortic branch vessels and iliac arteries.  Heterogeneous bone marrow as previously noted. Cannot exclude metastatic disease or myeloma. Schmorl's node deformities. Spinal stenosis most notable L4-5.  These results were called by telephone at the time of interpretation on 10/24/2014 at 7:10 am to Dr. Mingo Amber , who verbally acknowledged these results.   Electronically Signed   By: Genia Del M.D.   On: 10/24/2014 07:22   Dg Abd Acute W/chest  10/24/2014   CLINICAL DATA:  RIGHT lower quadrant pain beginning 2 days ago, nausea and vomiting. Pleural effusion.  EXAM: DG ABDOMEN ACUTE W/ 1V CHEST  COMPARISON:  Chest radiograph October 03, 2014  FINDINGS: RIGHT pleural fat extending to the lung apex, with decrease, smaller loose decidual pleural effusion. Cardiac silhouette is moderately enlarged unchanged. Calcified aortic knob. Similar chronic moderate bronchitic changes without focal consolidation. No pneumothorax. Soft tissue planes included osseous structure nonsuspicious, mild degenerative change of the thoracic spine.  Paucity of bowel gas. Surgical clips along LEFT abdomen associated with apparent large hernia, the lateral aspect not fully imaged. The hernia sac appears to contain multiple loops of bowel. No intra-abdominal mass effect or pathologic calcifications. No intraperitoneal free air on the decubitus view. Moderate degenerative change of the lumbar spine.  IMPRESSION: RIGHT chest tube, small residual RIGHT pleural effusion, improved.  Stable  cardiomegaly and moderate chronic bronchitic changes.  Large suspected LEFT lateral abdominal wall hernia containing bowel without bowel obstruction.   Electronically Signed   By: Elon Alas M.D.   On: 10/24/2014 05:16    ASSESMENT:   * Recurrent acute cholecystitis. Complicated, aged pt who has already undergone perc chole tube in 04/2014 -07/2014 and ERCP/sphinc/stone removal 06/2014.  Cholecystectomy planned for 8/4.   * Multiple severe comorbidities as per HPI, including AS. S/p pleurx catheter for righ pleural effusion.   * Non-critical thrombocytopenia dating back to 08/29/2014.  * Hx rectal cancer and LAR, ostomy. Right colon cancer and right colectomy 2002. Latest colonoscopy per Dr Amedeo Plenty ~ 2011/2012 per family recall.    PLAN   *  Cholecystectomy per Dr Donne Hazel.      Azucena Freed  10/25/2014, 11:00 AM Pager: [phone]  ________________________________________________________________________  Velora Heckler GI MD note:  I personally examined the patient, reviewed the data and agree with the assessment and plan described above.  If IOC shows retained CBD stone, will plan for ERCP.   Owens Loffler, MD [facility] Pager [phone]

## 2014-10-25 NOTE — Progress Notes (Signed)
Patient CV strip converted from A-fib to runs of V-tach for a short period; followed back to A-fib. Patient has no symptoms, no c/o chest pain, vital signs stable reading BP 104/59, P 72, T 98.7, R 16, O2 100 RA. MD notified. Will continue to monitor.

## 2014-10-25 NOTE — Progress Notes (Signed)
[facility] PROGRESS NOTE  [name] OEV:[id number] DOB: 1925/10/06 DOA: 10/24/2014 PCP: Abigail Miyamoto, MD  Assessment/Plan: 1. Acute cholecystitis. H/o cholecystitis, choledocholithiasis with recent  Biliary drainage perc cholecystostomy tube (04/2014) and ERCP (06/2014) -CT abdomen: Enlarged gallbladder contains small calcified stones with surrounding inflammation suggestive of acute cholecystitis -continue NPO, gentle IVF (monitor fluid balance closely due to CKD, Aortic stenosis), -IV zosyn -Plan for open cholecystectomy tomorrow, patient and family understand high risk of complications and cardiopulmonary morbidity/mortality -Appreciate cardiology input   2. CHF recurrent pleural effusion s/p Pleurex, Severe Aortic Stenosis. Echo (08/2014). LVEF: 65-70%. Severe aortic stenosis with Mean gradient (S): 34 mm Hg. -Patient does not appear in acute CHF. We will cont monitor closely fluid balance, I/O, daily weight. -Continue normal saline at 50 mL an hour, hold today's dose of Lasix -Resume oral Lasix in the next 1-2 days  3. A fib (no on AC due to risk bleeding/fall).  - cont BB, ASA  4. CKD IV. Patient with chronic edema. Creatinine slightly higher than baseline, hold by mouth Lasix  5. DM. -Stable, sliding scale insulin  6. History of colon cancer/colostomy  DVT prophylaxis with heparin subcutaneous  Code Status: DNR Family Communication: daughter at bedside Disposition Plan: Home when stable   Consultants:  CARDs  CCS  GI   HPI/Subjective: -Reports improvement in abdominal pain, no nausea or vomiting -Breathing at baseline  Objective: Filed Vitals:   10/25/14 1307  BP: 104/59  Pulse: 72  Temp: 98.7 F (37.1 C)  Resp: 16    Intake/Output Summary (Last 24 hours) at 10/25/14 1315 Last data filed at 10/25/14 1127  Gross per 24 hour  Intake   1554 ml  Output    800 ml  Net    754 ml   There were no vitals filed for this visit.  Exam:   Gen:   alert awake range at 3, appears stated age, no distress   cardiovascular: S1-S2, loud ejection systolic murmur,   Resp: decreased breath sounds especially at right base  Abdomen: Soft, mild diffuse tenderness no rigidity or rebound, diminished bowel sounds  Musculoskeletal:  trace edema, no clubbing or cyanosis   Data Reviewed: Basic Metabolic Panel:  Recent Labs Lab 10/20/14 1525 10/20/14 1526 10/24/14 0500 10/25/14 0445  NA 142  --  135 140  K 3.6  --  3.8 3.8  CL 102  --  97* 102  CO2 26  --  27 29  GLUCOSE 204*  --  207* 87  BUN 48*  --  54* 54*  CREATININE 2.97*  --  3.03* 3.30*  CALCIUM 9.0 8.9 8.6* 8.2*  PHOS 4.6  --   --   --    Liver Function Tests:  Recent Labs Lab 10/20/14 1525 10/24/14 0500  AST  --  17  ALT  --  8*  ALKPHOS  --  117  BILITOT  --  0.8  PROT  --  6.0*  ALBUMIN 2.9* 2.7*    Recent Labs Lab 10/24/14 0500  LIPASE 58*   No results for input(s): AMMONIA in the last 168 hours. CBC:  Recent Labs Lab 10/20/14 1451 10/24/14 0500 10/25/14 0445  WBC  --  11.8* 9.9  NEUTROABS  --  9.7*  --   HGB 11.7* 11.8* 10.2*  HCT  --  38.7 34.4*  MCV  --  94.6 97.5  PLT  --  141* 138*   Cardiac Enzymes: No results for input(s): CKTOTAL, CKMB, CKMBINDEX,  TROPONINI in the last 168 hours. BNP (last 3 results)  Recent Labs  03/15/14 0914 07/14/14 1125  BNP 941.4* 526.1*    ProBNP (last 3 results)  Recent Labs  09/08/14 1517  PROBNP 312.0*    CBG:  Recent Labs Lab 10/24/14 2033 10/24/14 2351 10/25/14 0500 10/25/14 0717 10/25/14 1144  GLUCAP 147* 100* 94 90 116*    No results found for this or any previous visit (from the past 240 hour(s)).   Studies: Ct Abdomen Pelvis Wo Contrast  10/24/2014   CLINICAL DATA:  81 year old female with right flank/right upper quadrant pain for 2 days. Nausea and vomiting. Drain in place to drain fluid from lungs and abdomen. Subsequent encounter.  EXAM: CT ABDOMEN AND PELVIS WITHOUT  CONTRAST  TECHNIQUE: Multidetector CT imaging of the abdomen and pelvis was performed following the standard protocol without IV contrast.  COMPARISON:  10/24/2014 plain film exam.  09/22/2014 CT.  FINDINGS: Inferior right chest tube is in place with decrease in size but incomplete clearance of right-sided hydro pneumothorax.  Very small left-sided pleural effusion with basilar atelectasis.  Enlarged gallbladder contains small calcified stones with surrounding inflammation suggestive of acute cholecystitis.  Pneumobilia once again noted in the common bile duct which may be related to prior sphincterotomy. No calcified common bile duct stone visualized.  Post colectomy with surgical clips surrounding the cecum/right colon and colostomy left lower abdomen with large parastomal hernia containing small and large bowel. Previously stomach partially entered the hernia but has been reduced. No free intraperitoneal air.  Cardiomegaly. Mitral valve and aortic valve calcifications. Coronary artery calcifications.  Atherosclerotic type changes aorta with mild ectasia. Atherosclerotic type changes iliac arteries.  Right kidney rotated anteriorly. No renal or ureteral obstructing stone. Left upper pole 1.2 cm cyst. Calcification left hilum may be vascular in origin.  Taking into account limitation by non contrast imaging, no worrisome hepatic, splenic, pancreatic or adrenal lesion.  Post hysterectomy.  No urinary bladder abnormality noted.  Heterogeneous bone marrow as previously noted. Cannot exclude metastatic disease myeloma. Schmorl's node deformities. Spinal stenosis most notable L4-5.  Scattered normal to top-normal size lymph nodes.  IMPRESSION: Enlarged gallbladder contains small calcified stones with surrounding inflammation suggestive of acute cholecystitis.  Pneumobilia once again noted in the common bile duct which may be related to prior sphincterotomy. No calcified common bile duct stone visualized.  Colostomy left  lower abdomen with large parastomal hernia containing small and large bowel.  Inferior right chest tube is in place with decrease in size but incomplete clearance of right-sided hydro pneumothorax.  Very small left-sided pleural effusion with basilar atelectasis.  Cardiomegaly. Mitral valve and aortic valve calcifications. Coronary artery calcifications.  Atherosclerotic type changes aorta, aortic branch vessels and iliac arteries.  Heterogeneous bone marrow as previously noted. Cannot exclude metastatic disease or myeloma. Schmorl's node deformities. Spinal stenosis most notable L4-5.  These results were called by telephone at the time of interpretation on 10/24/2014 at 7:10 am to Dr. Mingo Amber , who verbally acknowledged these results.   Electronically Signed   By: Genia Del M.D.   On: 10/24/2014 07:22   Dg Abd Acute W/chest  10/24/2014   CLINICAL DATA:  RIGHT lower quadrant pain beginning 2 days ago, nausea and vomiting. Pleural effusion.  EXAM: DG ABDOMEN ACUTE W/ 1V CHEST  COMPARISON:  Chest radiograph October 03, 2014  FINDINGS: RIGHT pleural fat extending to the lung apex, with decrease, smaller loose decidual pleural effusion. Cardiac silhouette is moderately enlarged unchanged. Calcified  aortic knob. Similar chronic moderate bronchitic changes without focal consolidation. No pneumothorax. Soft tissue planes included osseous structure nonsuspicious, mild degenerative change of the thoracic spine.  Paucity of bowel gas. Surgical clips along LEFT abdomen associated with apparent large hernia, the lateral aspect not fully imaged. The hernia sac appears to contain multiple loops of bowel. No intra-abdominal mass effect or pathologic calcifications. No intraperitoneal free air on the decubitus view. Moderate degenerative change of the lumbar spine.  IMPRESSION: RIGHT chest tube, small residual RIGHT pleural effusion, improved.  Stable cardiomegaly and moderate chronic bronchitic changes.  Large suspected LEFT  lateral abdominal wall hernia containing bowel without bowel obstruction.   Electronically Signed   By: Elon Alas M.D.   On: 10/24/2014 05:16    Scheduled Meds: . aspirin EC  81 mg Oral QHS  . calcitRIOL  0.25 mcg Oral Daily  . carvedilol  25 mg Oral BID  . heparin  5,000 Units Subcutaneous 3 times per day  . insulin aspart  0-9 Units Subcutaneous 6 times per day  . LORazepam  0.5 mg Oral BID  . mirtazapine  7.5 mg Oral QHS  . piperacillin-tazobactam (ZOSYN)  IV  2.25 g Intravenous 3 times per day  . saccharomyces boulardii  250 mg Oral BID  . simvastatin  20 mg Oral q1800  . sodium chloride  3 mL Intravenous Q12H   Continuous Infusions: . sodium chloride 50 mL/hr (10/24/14 1015)   Antibiotics Given (last 72 hours)    Date/Time Action Medication Dose Rate   10/24/14 1300 Given   piperacillin-tazobactam (ZOSYN) IVPB 2.25 g 2.25 g 100 mL/hr   10/24/14 2151 Given   piperacillin-tazobactam (ZOSYN) IVPB 2.25 g 2.25 g 100 mL/hr   10/25/14 7262 Given   piperacillin-tazobactam (ZOSYN) IVPB 2.25 g 2.25 g 100 mL/hr      Active Problems:   Acute cholecystitis   Choledocholithiasis with acute cholecystitis   Aortic stenosis, severe   Preoperative cardiovascular examination    Time spent: 71min    Mikyah Alamo  [facility] Pager [phone]. If 7PM-7AM, please contact night-coverage at [email or web address], password [facility] 10/25/2014, 1:15 PM  LOS: 1 day

## 2014-10-25 NOTE — Progress Notes (Signed)
Patient ID: [name], female   DOB: [birth year], 81 y.o.   MRN: [id number]    Subjective: Pt doing ok, but c/o soreness.  Tolerating clears  Objective: Vital signs in last 24 hours: Temp:  [98.4 F (36.9 C)-99 F (37.2 C)] 98.7 F (37.1 C) (08/03 0504) Pulse Rate:  [65-70] 68 (08/03 0504) Resp:  [16] 16 (08/03 0504) BP: (101-110)/(50-55) 105/51 mmHg (08/03 0504) SpO2:  [97 %-100 %] 97 % (08/03 0504) Last BM Date: 10/24/14  Intake/Output from previous day: 08/02 0701 - 08/03 0700 In: 1194 [P.O.:380; I.V.:714; IV Piggyback:100] Out: 300 [Urine:300] Intake/Output this shift: Total I/O In: -  Out: 300 [Urine:300]  PE: Abd: soft, tender in RUQ, ostomy bag in place in LLQ with air Heart: + murmur, irregular LungS: CTAB  Lab Results:   Recent Labs  10/24/14 0500 10/25/14 0445  WBC 11.8* 9.9  HGB 11.8* 10.2*  HCT 38.7 34.4*  PLT 141* 138*   BMET  Recent Labs  10/24/14 0500 10/25/14 0445  NA 135 140  K 3.8 3.8  CL 97* 102  CO2 27 29  GLUCOSE 207* 87  BUN 54* 54*  CREATININE 3.03* 3.30*  CALCIUM 8.6* 8.2*   PT/INR No results for input(s): LABPROT, INR in the last 72 hours. CMP     Component Value Date/Time   NA 140 10/25/2014 0445   K 3.8 10/25/2014 0445   CL 102 10/25/2014 0445   CO2 29 10/25/2014 0445   GLUCOSE 87 10/25/2014 0445   BUN 54* 10/25/2014 0445   CREATININE 3.30* 10/25/2014 0445   CALCIUM 8.2* 10/25/2014 0445   CALCIUM 8.9 10/20/2014 1526   PROT 6.0* 10/24/2014 0500   ALBUMIN 2.7* 10/24/2014 0500   AST 17 10/24/2014 0500   ALT 8* 10/24/2014 0500   ALKPHOS 117 10/24/2014 0500   BILITOT 0.8 10/24/2014 0500   GFRNONAA 12* 10/25/2014 0445   GFRAA 14* 10/25/2014 0445   Lipase     Component Value Date/Time   LIPASE 58* 10/24/2014 0500       Studies/Results: Ct Abdomen Pelvis Wo Contrast  10/24/2014   CLINICAL DATA:  81 year old female with right flank/right upper quadrant pain for 2 days. Nausea and vomiting. Drain in place to  drain fluid from lungs and abdomen. Subsequent encounter.  EXAM: CT ABDOMEN AND PELVIS WITHOUT CONTRAST  TECHNIQUE: Multidetector CT imaging of the abdomen and pelvis was performed following the standard protocol without IV contrast.  COMPARISON:  10/24/2014 plain film exam.  09/22/2014 CT.  FINDINGS: Inferior right chest tube is in place with decrease in size but incomplete clearance of right-sided hydro pneumothorax.  Very small left-sided pleural effusion with basilar atelectasis.  Enlarged gallbladder contains small calcified stones with surrounding inflammation suggestive of acute cholecystitis.  Pneumobilia once again noted in the common bile duct which may be related to prior sphincterotomy. No calcified common bile duct stone visualized.  Post colectomy with surgical clips surrounding the cecum/right colon and colostomy left lower abdomen with large parastomal hernia containing small and large bowel. Previously stomach partially entered the hernia but has been reduced. No free intraperitoneal air.  Cardiomegaly. Mitral valve and aortic valve calcifications. Coronary artery calcifications.  Atherosclerotic type changes aorta with mild ectasia. Atherosclerotic type changes iliac arteries.  Right kidney rotated anteriorly. No renal or ureteral obstructing stone. Left upper pole 1.2 cm cyst. Calcification left hilum may be vascular in origin.  Taking into account limitation by non contrast imaging, no worrisome hepatic, splenic, pancreatic or adrenal lesion.  Post hysterectomy.  No urinary bladder abnormality noted.  Heterogeneous bone marrow as previously noted. Cannot exclude metastatic disease myeloma. Schmorl's node deformities. Spinal stenosis most notable L4-5.  Scattered normal to top-normal size lymph nodes.  IMPRESSION: Enlarged gallbladder contains small calcified stones with surrounding inflammation suggestive of acute cholecystitis.  Pneumobilia once again noted in the common bile duct which may be  related to prior sphincterotomy. No calcified common bile duct stone visualized.  Colostomy left lower abdomen with large parastomal hernia containing small and large bowel.  Inferior right chest tube is in place with decrease in size but incomplete clearance of right-sided hydro pneumothorax.  Very small left-sided pleural effusion with basilar atelectasis.  Cardiomegaly. Mitral valve and aortic valve calcifications. Coronary artery calcifications.  Atherosclerotic type changes aorta, aortic branch vessels and iliac arteries.  Heterogeneous bone marrow as previously noted. Cannot exclude metastatic disease or myeloma. Schmorl's node deformities. Spinal stenosis most notable L4-5.  These results were called by telephone at the time of interpretation on 10/24/2014 at 7:10 am to Dr. Mingo Amber , who verbally acknowledged these results.   Electronically Signed   By: Genia Del M.D.   On: 10/24/2014 07:22   Dg Abd Acute W/chest  10/24/2014   CLINICAL DATA:  RIGHT lower quadrant pain beginning 2 days ago, nausea and vomiting. Pleural effusion.  EXAM: DG ABDOMEN ACUTE W/ 1V CHEST  COMPARISON:  Chest radiograph October 03, 2014  FINDINGS: RIGHT pleural fat extending to the lung apex, with decrease, smaller loose decidual pleural effusion. Cardiac silhouette is moderately enlarged unchanged. Calcified aortic knob. Similar chronic moderate bronchitic changes without focal consolidation. No pneumothorax. Soft tissue planes included osseous structure nonsuspicious, mild degenerative change of the thoracic spine.  Paucity of bowel gas. Surgical clips along LEFT abdomen associated with apparent large hernia, the lateral aspect not fully imaged. The hernia sac appears to contain multiple loops of bowel. No intra-abdominal mass effect or pathologic calcifications. No intraperitoneal free air on the decubitus view. Moderate degenerative change of the lumbar spine.  IMPRESSION: RIGHT chest tube, small residual RIGHT pleural effusion,  improved.  Stable cardiomegaly and moderate chronic bronchitic changes.  Large suspected LEFT lateral abdominal wall hernia containing bowel without bowel obstruction.   Electronically Signed   By: Elon Alas M.D.   On: 10/24/2014 05:16    Anti-infectives: Anti-infectives    Start     Dose/Rate Route Frequency Ordered Stop   10/24/14 1100  piperacillin-tazobactam (ZOSYN) IVPB 2.25 g     2.25 g 100 mL/hr over 30 Minutes Intravenous 3 times per day 10/24/14 1035     10/24/14 0730  piperacillin-tazobactam (ZOSYN) IVPB 3.375 g     3.375 g 100 mL/hr over 30 Minutes Intravenous  Once 10/24/14 0718 10/24/14 0832       Assessment/Plan   1. Acute cholecystitis, s/p perc drain earlier this year -plan for cholecystectomy tomorrow.  Will likely be open.   -NPO p MN -cont zosyn D2 -appreciate cardiology input -will get anesthesia consult to evaluate her prior to surgery so they can go ahead and place lines or whatever they may need to do in preparation for OR tomorrow -cont clears today -d/w the patient and her family, they likelihood that this operation will be open for multiple reasons.  They understand. They also understand she is at very high risk with this operation given her other comorbidities, particularly her AS.  She is at risk for MI, CVA, death, etc.  They understand this and are agreeable  to proceed. -hold heparin tomorrow 8-4 for OR  LOS: 1 day    Bilal Manzer E 10/25/2014, 9:22 AM Pager: [phone]

## 2014-10-25 NOTE — Anesthesia Preprocedure Evaluation (Addendum)
Anesthesia Evaluation  Patient identified by MRN, date of birth, ID band Patient awake    Reviewed: Allergy & Precautions, NPO status , Patient's Chart, lab work & pertinent test results, reviewed documented beta blocker date and time   Airway Mallampati: II  TM Distance: >3 FB Neck ROM: Full    Dental  (+) Dental Advisory Given   Pulmonary  breath sounds clear to auscultation        Cardiovascular hypertension, Pt. on medications +CHF (DIASTOLIC DYSFUNCTION) + dysrhythmias (Chrojic AF, not on anticoagulants 2nd fall risk) Atrial Fibrillation Rhythm:Regular  SEVERE AS area .8, gradient 34, EF 60%, mild Pulmonary HTN, being followed by  DR.  Candee Furbish cardiology   Neuro/Psych  Headaches, Anxiety Depression    GI/Hepatic Acute cholecystitis   Endo/Other  diabetes, Type 2, Insulin Dependent  Renal/GU Renal InsufficiencyRenal diseaseStage IV RI creat 3.3     Musculoskeletal   Abdominal (+)  Abdomen: soft.    Peds  Hematology  (+) anemia , 10/34   Anesthesia Other Findings   Reproductive/Obstetrics                         Anesthesia Physical Anesthesia Plan  ASA: IV  Anesthesia Plan: General   Post-op Pain Management:    Induction: Intravenous  Airway Management Planned: Oral ETT  Additional Equipment: Arterial line  Intra-op Plan:   Post-operative Plan:   Informed Consent: I have reviewed the patients History and Physical, chart, labs and discussed the procedure including the risks, benefits and alternatives for the proposed anesthesia with the patient or authorized representative who has indicated his/her understanding and acceptance.     Plan Discussed with:   Anesthesia Plan Comments: (SEVERE AS cannot allow decrease in afterload.  Stage IV renal insufficiency, arterial line be helpful, 2nd IV, have NEO drip available, have Ketamine available.  DNR on file,)        Anesthesia Quick Evaluation

## 2014-10-26 ENCOUNTER — Inpatient Hospital Stay (HOSPITAL_COMMUNITY): Payer: Medicare Other | Admitting: Anesthesiology

## 2014-10-26 ENCOUNTER — Encounter (HOSPITAL_COMMUNITY): Payer: Self-pay | Admitting: Certified Registered"

## 2014-10-26 ENCOUNTER — Encounter (HOSPITAL_COMMUNITY)
Admission: EM | Disposition: A | Discharge status: Home Health | Payer: Self-pay | Source: Home / Self Care | Attending: Internal Medicine

## 2014-10-26 HISTORY — PX: CHOLECYSTECTOMY: SHX55

## 2014-10-26 LAB — COMPREHENSIVE METABOLIC PANEL
ALBUMIN: 2.1 g/dL — AB (ref 3.5–5.0)
ALK PHOS: 82 U/L (ref 38–126)
ALT: 6 U/L — AB (ref 14–54)
AST: 17 U/L (ref 15–41)
Anion gap: 12 (ref 5–15)
BUN: 58 mg/dL — ABNORMAL HIGH (ref 6–20)
CALCIUM: 8.2 mg/dL — AB (ref 8.9–10.3)
CO2: 26 mmol/L (ref 22–32)
Chloride: 100 mmol/L — ABNORMAL LOW (ref 101–111)
Creatinine, Ser: 3.85 mg/dL — ABNORMAL HIGH (ref 0.44–1.00)
GFR calc Af Amer: 11 mL/min — ABNORMAL LOW (ref >60)
GFR calc non Af Amer: 10 mL/min — ABNORMAL LOW (ref >60)
Glucose, Bld: 84 mg/dL (ref 65–99)
Potassium: 4.8 mmol/L (ref 3.5–5.1)
SODIUM: 138 mmol/L (ref 135–145)
TOTAL PROTEIN: 5.5 g/dL — AB (ref 6.5–8.1)
Total Bilirubin: 1.3 mg/dL — ABNORMAL HIGH (ref 0.3–1.2)

## 2014-10-26 LAB — URINE CULTURE: Culture: >=100000

## 2014-10-26 LAB — CBC
HCT: 34.2 % — ABNORMAL LOW (ref 36.0–46.0)
Hemoglobin: 10.2 g/dL — ABNORMAL LOW (ref 12.0–15.0)
MCH: 28.6 pg (ref 26.0–34.0)
MCHC: 29.8 g/dL — ABNORMAL LOW (ref 30.0–36.0)
MCV: 95.8 fL (ref 78.0–100.0)
Platelets: 141 10*3/uL — ABNORMAL LOW (ref 150–400)
RBC: 3.57 MIL/uL — ABNORMAL LOW (ref 3.87–5.11)
RDW: 16.2 % — ABNORMAL HIGH (ref 11.5–15.5)
WBC: 9.7 10*3/uL (ref 4.0–10.5)

## 2014-10-26 LAB — GLUCOSE, CAPILLARY
GLUCOSE-CAPILLARY: 96 mg/dL (ref 65–99)
Glucose-Capillary: 109 mg/dL — ABNORMAL HIGH (ref 65–99)
Glucose-Capillary: 88 mg/dL (ref 65–99)
Glucose-Capillary: 88 mg/dL (ref 65–99)
Glucose-Capillary: 91 mg/dL (ref 65–99)
Glucose-Capillary: 88 mg/dL (ref 65–99)
Glucose-Capillary: 97 mg/dL (ref 65–99)
Glucose-Capillary: 108 mg/dL — ABNORMAL HIGH (ref 65–99)

## 2014-10-26 LAB — SURGICAL PCR SCREEN
MRSA, PCR: NEGATIVE
Staphylococcus aureus: NEGATIVE

## 2014-10-26 SURGERY — CHOLECYSTECTOMY
Anesthesia: General | Site: Abdomen

## 2014-10-26 MED ORDER — SODIUM CHLORIDE 0.9 % IV SOLN
INTRAVENOUS | Status: DC
  Administered 2014-10-26 (×2): via INTRAVENOUS

## 2014-10-26 MED ORDER — HEPARIN SODIUM (PORCINE) 5000 UNIT/ML IJ SOLN
5000.0000 [IU] | Freq: Three times a day (TID) | INTRAMUSCULAR | Status: DC
  Administered 2014-10-27 – 2014-11-01 (×16): 5000 [IU] via SUBCUTANEOUS
  Filled 2014-10-26 (×17): qty 1

## 2014-10-26 MED ORDER — FENTANYL CITRATE (PF) 100 MCG/2ML IJ SOLN
INTRAMUSCULAR | Status: AC
  Filled 2014-10-26: qty 2

## 2014-10-26 MED ORDER — CHLORHEXIDINE GLUCONATE 0.12% ORAL RINSE (MEDLINE KIT)
15.0000 mL | Freq: Two times a day (BID) | OROMUCOSAL | Status: DC
  Administered 2014-10-26: 15 mL via OROMUCOSAL

## 2014-10-26 MED ORDER — ONDANSETRON HCL 4 MG/2ML IJ SOLN
INTRAMUSCULAR | Status: AC
  Administered 2014-10-26: 4 mg
  Filled 2014-10-26: qty 2

## 2014-10-26 MED ORDER — PROMETHAZINE HCL 25 MG/ML IJ SOLN: 6.2500 mg | INTRAMUSCULAR | Status: DC | PRN

## 2014-10-26 MED ORDER — HEMOSTATIC AGENTS (NO CHARGE) OPTIME
TOPICAL | Status: DC | PRN
  Administered 2014-10-26: 1 via TOPICAL

## 2014-10-26 MED ORDER — KETAMINE HCL 100 MG/ML IJ SOLN
INTRAMUSCULAR | Status: AC
  Filled 2014-10-26: qty 1

## 2014-10-26 MED ORDER — PROPOFOL 10 MG/ML IV BOLUS
INTRAVENOUS | Status: AC
  Filled 2014-10-26: qty 20

## 2014-10-26 MED ORDER — MIRTAZAPINE 15 MG PO TABS
7.5000 mg | ORAL_TABLET | Freq: Every day | ORAL | Status: DC
  Administered 2014-10-27 – 2014-10-31 (×5): 7.5 mg via ORAL
  Filled 2014-10-26 (×6): qty 1

## 2014-10-26 MED ORDER — CETYLPYRIDINIUM CHLORIDE 0.05 % MT LIQD: 7.0000 mL | Freq: Two times a day (BID) | OROMUCOSAL | Status: DC

## 2014-10-26 MED ORDER — ROCURONIUM BROMIDE 100 MG/10ML IV SOLN
INTRAVENOUS | Status: DC | PRN
  Administered 2014-10-26 (×2): 20 mg via INTRAVENOUS
  Administered 2014-10-26: 10 mg via INTRAVENOUS

## 2014-10-26 MED ORDER — LIDOCAINE HCL (CARDIAC) 20 MG/ML IV SOLN
INTRAVENOUS | Status: DC | PRN
  Administered 2014-10-26: 50 mg via INTRAVENOUS

## 2014-10-26 MED ORDER — SUFENTANIL CITRATE 50 MCG/ML IV SOLN
INTRAVENOUS | Status: AC
  Filled 2014-10-26: qty 1

## 2014-10-26 MED ORDER — 0.9 % SODIUM CHLORIDE (POUR BTL) OPTIME
TOPICAL | Status: DC | PRN
  Administered 2014-10-26 (×2): 1000 mL

## 2014-10-26 MED ORDER — SODIUM CHLORIDE 0.9 % IV SOLN: INTRAVENOUS | Status: DC | PRN

## 2014-10-26 MED ORDER — PROPOFOL 10 MG/ML IV BOLUS
INTRAVENOUS | Status: DC | PRN
  Administered 2014-10-26: 40 mg via INTRAVENOUS

## 2014-10-26 MED ORDER — DEXTROSE 5 % IV SOLN
10.0000 mg | INTRAVENOUS | Status: DC | PRN
  Administered 2014-10-26: 80 ug/min via INTRAVENOUS

## 2014-10-26 MED ORDER — MEPERIDINE HCL 25 MG/ML IJ SOLN: 6.2500 mg | INTRAMUSCULAR | Status: DC | PRN

## 2014-10-26 MED ORDER — FENTANYL CITRATE (PF) 100 MCG/2ML IJ SOLN
25.0000 ug | INTRAMUSCULAR | Status: DC | PRN
  Administered 2014-10-26: 50 ug via INTRAVENOUS

## 2014-10-26 MED ORDER — KETAMINE HCL 10 MG/ML IJ SOLN
INTRAMUSCULAR | Status: DC | PRN
  Administered 2014-10-26: 40 mg via INTRAVENOUS

## 2014-10-26 MED ORDER — MORPHINE SULFATE 2 MG/ML IJ SOLN
1.0000 mg | Freq: Once | INTRAMUSCULAR | Status: AC
  Administered 2014-10-26: 1 mg via INTRAVENOUS
  Filled 2014-10-26: qty 1

## 2014-10-26 MED ORDER — SUGAMMADEX SODIUM 200 MG/2ML IV SOLN
INTRAVENOUS | Status: AC
  Filled 2014-10-26: qty 2

## 2014-10-26 MED ORDER — ONDANSETRON HCL 4 MG/2ML IJ SOLN
INTRAMUSCULAR | Status: DC | PRN
  Administered 2014-10-26: 4 mg via INTRAVENOUS

## 2014-10-26 MED ORDER — MORPHINE SULFATE 2 MG/ML IJ SOLN
1.0000 mg | INTRAMUSCULAR | Status: DC | PRN
  Administered 2014-10-26: 2 mg via INTRAVENOUS
  Filled 2014-10-26: qty 1

## 2014-10-26 MED ORDER — SUGAMMADEX SODIUM 200 MG/2ML IV SOLN
INTRAVENOUS | Status: DC | PRN
  Administered 2014-10-26: 200 mg via INTRAVENOUS

## 2014-10-26 MED ORDER — FUROSEMIDE 10 MG/ML IJ SOLN: 60.0000 mg | Freq: Once | INTRAMUSCULAR | Status: DC

## 2014-10-26 MED ORDER — SODIUM CHLORIDE 0.9 % IV SOLN
INTRAVENOUS | Status: AC
  Administered 2014-10-26: 17:00:00 via INTRAVENOUS

## 2014-10-26 MED ORDER — ONDANSETRON HCL 4 MG/2ML IJ SOLN
4.0000 mg | INTRAMUSCULAR | Status: DC | PRN
  Administered 2014-10-26: 4 mg via INTRAVENOUS
  Filled 2014-10-26: qty 2

## 2014-10-26 MED ORDER — SUFENTANIL CITRATE 50 MCG/ML IV SOLN
INTRAVENOUS | Status: DC | PRN
  Administered 2014-10-26 (×2): 10 ug via INTRAVENOUS

## 2014-10-26 MED ORDER — FUROSEMIDE 10 MG/ML IJ SOLN
60.0000 mg | INTRAMUSCULAR | Status: AC
  Administered 2014-10-26: 60 mg via INTRAVENOUS
  Filled 2014-10-26: qty 6

## 2014-10-26 MED ORDER — FENTANYL CITRATE (PF) 100 MCG/2ML IJ SOLN
25.0000 ug | INTRAMUSCULAR | Status: DC | PRN
  Administered 2014-10-26: 50 ug via INTRAVENOUS
  Filled 2014-10-26: qty 2

## 2014-10-26 SURGICAL SUPPLY — 59 items
BLADE SURG ROTATE 9660 (MISCELLANEOUS) IMPLANT
CANISTER SUCTION 2500CC (MISCELLANEOUS) ×2 IMPLANT
CHLORAPREP W/TINT 26ML (MISCELLANEOUS) ×2 IMPLANT
CHOLANGIOGRAM CATH TAUT (CATHETERS) IMPLANT
CLIP TI LARGE 6 (CLIP) ×2 IMPLANT
CLIP TI MEDIUM 6 (CLIP) ×3 IMPLANT
COVER MAYO STAND STRL (DRAPES) IMPLANT
COVER SURGICAL LIGHT HANDLE (MISCELLANEOUS) ×2 IMPLANT
DRAIN CHANNEL 19F RND (DRAIN) IMPLANT
DRAPE C-ARM 42X72 X-RAY (DRAPES) IMPLANT
DRAPE LAPAROSCOPIC ABDOMINAL (DRAPES) ×2 IMPLANT
ELECT BLADE 4.0 EZ CLEAN MEGAD (MISCELLANEOUS) ×2
ELECT BLADE 6.5 EXT (BLADE) ×2 IMPLANT
ELECT CAUTERY BLADE 6.4 (BLADE) ×2 IMPLANT
ELECT REM PT RETURN 9FT ADLT (ELECTROSURGICAL) ×2
ELECTRODE BLDE 4.0 EZ CLN MEGD (MISCELLANEOUS) IMPLANT
ELECTRODE REM PT RTRN 9FT ADLT (ELECTROSURGICAL) ×1 IMPLANT
EVACUATOR SILICONE 100CC (DRAIN) IMPLANT
GAUZE SPONGE 4X4 16PLY XRAY LF (GAUZE/BANDAGES/DRESSINGS) IMPLANT
GLOVE BIO SURGEON STRL SZ 6.5 (GLOVE) ×1 IMPLANT
GLOVE BIO SURGEON STRL SZ7 (GLOVE) ×2 IMPLANT
GLOVE BIO SURGEON STRL SZ8 (GLOVE) ×2 IMPLANT
GLOVE BIOGEL PI IND STRL 6.5 (GLOVE) IMPLANT
GLOVE BIOGEL PI IND STRL 7.0 (GLOVE) IMPLANT
GLOVE BIOGEL PI IND STRL 7.5 (GLOVE) ×1 IMPLANT
GLOVE BIOGEL PI IND STRL 8 (GLOVE) IMPLANT
GLOVE BIOGEL PI INDICATOR 6.5 (GLOVE) ×2
GLOVE BIOGEL PI INDICATOR 7.0 (GLOVE) ×2
GLOVE BIOGEL PI INDICATOR 7.5 (GLOVE) ×1
GLOVE BIOGEL PI INDICATOR 8 (GLOVE) ×1
GOWN STRL REUS W/ TWL LRG LVL3 (GOWN DISPOSABLE) ×3 IMPLANT
GOWN STRL REUS W/TWL LRG LVL3 (GOWN DISPOSABLE) ×6
HEMOSTAT SNOW SURGICEL 2X4 (HEMOSTASIS) ×1 IMPLANT
KIT BASIN OR (CUSTOM PROCEDURE TRAY) ×2 IMPLANT
KIT ROOM TURNOVER OR (KITS) ×2 IMPLANT
LIQUID BAND (GAUZE/BANDAGES/DRESSINGS) ×2 IMPLANT
NDL HYPO 25X1 1.5 SAFETY (NEEDLE) ×1 IMPLANT
NEEDLE HYPO 25X1 1.5 SAFETY (NEEDLE) ×2 IMPLANT
NS IRRIG 1000ML POUR BTL (IV SOLUTION) ×4 IMPLANT
PACK GENERAL/GYN (CUSTOM PROCEDURE TRAY) ×2 IMPLANT
PAD ARMBOARD 7.5X6 YLW CONV (MISCELLANEOUS) ×4 IMPLANT
SET EXTENSION TUBING 8  CATH (SET/KITS/TRAYS/PACK) IMPLANT
SPECIMEN JAR MEDIUM (MISCELLANEOUS) ×1 IMPLANT
SPECIMEN JAR SMALL (MISCELLANEOUS) ×2 IMPLANT
SPONGE GAUZE 4X4 12PLY STER LF (GAUZE/BANDAGES/DRESSINGS) ×1 IMPLANT
STAPLER VISISTAT 35W (STAPLE) ×1 IMPLANT
STOPCOCK 4 WAY LG BORE MALE ST (IV SETS) IMPLANT
STRIP CLOSURE SKIN 1/2X4 (GAUZE/BANDAGES/DRESSINGS) ×2 IMPLANT
SUT MNCRL AB 4-0 PS2 18 (SUTURE) ×2 IMPLANT
SUT PDS AB 1 CTX 36 (SUTURE) ×4 IMPLANT
SUT VIC AB 0 CT1 27 (SUTURE) ×2
SUT VIC AB 0 CT1 27XBRD ANBCTR (SUTURE) IMPLANT
SUT VIC AB 2-0 SH 18 (SUTURE) ×2 IMPLANT
SYR 20CC LL (SYRINGE) IMPLANT
SYR CONTROL 10ML LL (SYRINGE) ×2 IMPLANT
TAPE CLOTH SURG 4X10 WHT LF (GAUZE/BANDAGES/DRESSINGS) ×1 IMPLANT
TOWEL OR 17X24 6PK STRL BLUE (TOWEL DISPOSABLE) ×2 IMPLANT
TOWEL OR 17X26 10 PK STRL BLUE (TOWEL DISPOSABLE) ×2 IMPLANT
TRAY FOLEY CATH 14FRSI W/METER (CATHETERS) ×1 IMPLANT

## 2014-10-26 NOTE — Consult Note (Signed)
PULMONARY / CRITICAL CARE MEDICINE   Name: [name] MRN: [id number] DOB: 04-May-1925    ADMISSION DATE:  10/24/2014 CONSULTATION DATE:  10/26/14  REFERRING MD :  Dr. Donne Hazel  CHIEF COMPLAINT:  Post-operative open cholecystectomy   INITIAL PRESENTATION: 81 y/o F with multiple medical problems to include severe aortic stenosis (pending need for AVR) and gallbladder disease s/p perc cholecystostomy (04/2014) who was admitted 8/2 with recurrent cholecystitis.  The patient underwent open cholecystectomy on 8/4 per Dr. Donne Hazel and was returned to ICU for observation.  PCCM consulted to assist with ICU care.    STUDIES:  6/01  ECHO >> LVEF 65-70%, severe AS, pa peak pressure 34  SIGNIFICANT EVENTS: 8/02  Admit with cholecystitis  8/04  Open cholecystectomy    HISTORY OF PRESENT ILLNESS:  81 y/o F with PMH of CKD IV s/p AVF (not on HD yet), anemia, UTI's, chronic atrial fibrillation (not on anti-coagulation due to high fall risk / bleeding), chronic diastolic CHF, HTN, carotid disease, depression / anxiety, arthritis, recurrent right pleural effusions s/p pleurX, C-Diff (09/2014), appendectomy, severe aortic stenosis (pending need for AVR) and gallbladder disease s/p perc cholecystostomy (04/2014) / ERCP ([facility]) who was admitted 8/2 with recurrent cholecystitis.    The patient presented to the ER on 8/2 with 48 hour hx of nausea, vomiting and RUQ abdominal pain.  CT of the ABD showed cholecystitis.  The patient was admitted per [facility].  She was treated with IVF's, Zosyn and made NPO.  The patient was evaluated by [facility] and [facility].  She was felt to be a high surgical risk due to multiple medical problems.  She was seen by Cardiology and cleared for surgery.  The patient underwent an open cholecystectomy on 8/4 per Dr. Donne Hazel and was returned to ICU for observation given multiple medical problems.  PCCM consulted to assist with ICU care.    PAST MEDICAL HISTORY :   has a past medical history  of CKD (chronic kidney disease), stage IV (2014); Anemia; Urinary tract infection; Colon cancer (1980s); Chronic atrial fibrillation; Chronic diastolic CHF (congestive heart failure); Hypertension; Carotid disease, bilateral; H/O cardiovascular stress test; Severe aortic stenosis (08/23/2014); Depression with anxiety; Headache; Arthritis; Recurrent pleural effusion on right; Choledocholithiasis with acute cholecystitis (06/14/2014); Clostridium difficile diarrhea (09/2014); Type II diabetes mellitus with complication (8/33/3832); Thrombocytopenia (08/29/2014); and Cholecystitis (10/2014).  has past surgical history that includes Abdominal hysterectomy; Colon surgery (2002); Appendectomy; Colostomy (1980s); Cardioversion (09/30/2011); I&D extremity (11/20/2011); AV fistula placement (Left, 06/02/2012); Bascilic vein transposition (Left, 08/18/2012); ERCP (N/A, 07/11/2014); biliary stent placement (N/A, 07/11/2014); ERCP (N/A, 07/21/2014); Fracture surgery; Chest tube insertion (Right, 09/12/2014); ct perc cholecystostomy (05/11/2014); and Pleural effusion drainage (09/12/2014).    Prior to Admission medications   Medication Sig Start Date End Date Taking? Authorizing Provider  acetaminophen (TYLENOL) 500 MG tablet Take 500 mg by mouth every 6 (six) hours as needed for mild pain.    Yes Historical Provider, MD  aspirin EC 81 MG tablet Take 81 mg by mouth at bedtime.    Yes Historical Provider, MD  calcitRIOL (ROCALTROL) 0.25 MCG capsule Take 0.25 mcg by mouth daily.  04/06/12  Yes Historical Provider, MD  carvedilol (COREG) 25 MG tablet Take 25 mg by mouth 2 (two) times daily. 09/15/14  Yes Historical Provider, MD  clotrimazole-betamethasone (LOTRISONE) lotion Apply 1 application topically daily as needed (apply to rash on stomach as needed).  05/25/14  Yes Historical Provider, MD  docusate sodium (COLACE) 100 MG capsule Take  100 mg by mouth daily as needed for constipation.    Yes Historical Provider, MD  epoetin alfa  (EPOGEN,PROCRIT) 16109 UNIT/ML injection 20,000 Units. Every two weeks   Yes Historical Provider, MD  furosemide (LASIX) 80 MG tablet Take 1 tablet (80 mg total) by mouth 2 (two) times daily. 09/08/14  Yes [name], MD  HYDROcodone-acetaminophen (NORCO/VICODIN) 5-325 MG per tablet Take 1 tablet by mouth every 4 (four) hours as needed for moderate pain. 05/16/14  Yes Orson Eva, MD  insulin glargine (LANTUS) 100 UNIT/ML injection Inject 8 Units into the skin at bedtime as needed (if cbg over 200).    Yes Historical Provider, MD  linagliptin (TRADJENTA) 5 MG TABS tablet Take 5 mg by mouth daily.    Yes Historical Provider, MD  LORazepam (ATIVAN) 0.5 MG tablet Take 0.5 mg by mouth 2 (two) times daily.   Yes Historical Provider, MD  meclizine (ANTIVERT) 12.5 MG tablet Take 12.5 mg by mouth as needed for dizziness.  11/23/12  Yes Historical Provider, MD  mirtazapine (REMERON) 7.5 MG tablet Take 7.5 mg by mouth at bedtime.  09/28/12  Yes Historical Provider, MD  promethazine (PHENERGAN) 12.5 MG tablet Take 1 tablet (12.5 mg total) by mouth every 6 (six) hours as needed for nausea or vomiting. 03/14/14  Yes Ripudeep Krystal Eaton, MD  saccharomyces boulardii (FLORASTOR) 250 MG capsule Take 1 capsule (250 mg total) by mouth 2 (two) times daily. 09/29/14  Yes Florencia Reasons, MD  simvastatin (ZOCOR) 20 MG tablet TAKE 1 TABLET (20 MG TOTAL) BY MOUTH AT BEDTIME. 08/28/14  Yes [name], MD  traMADol (ULTRAM) 50 MG tablet Take 1 tablet (50 mg total) by mouth every 6 (six) hours as needed for moderate pain. 09/12/14  Yes Melrose Nakayama, MD  vitamin B-12 (CYANOCOBALAMIN) 1000 MCG tablet Take 1,000 mcg by mouth daily.   Yes Historical Provider, MD   Allergies  Allergen Reactions  . Levaquin [Levofloxacin] Nausea Only  . Codeine Nausea Only  . Keflet [Cephalexin] Nausea Only  . Morphine And Related Nausea Only  . Oxycodone Other (See Comments)    Abnormal behavior    FAMILY HISTORY:  indicated that her mother is  deceased. She indicated that her father is deceased. She indicated that only one of her two sisters is alive.    SOCIAL HISTORY:  reports that she has never smoked. Her smokeless tobacco use includes Chew. She reports that she does not drink alcohol or use illicit drugs.  REVIEW OF SYSTEMS:  Unable to complete as patient is sedate post surgery.    SUBJECTIVE: PACU RN states BP stable, no acute events.  Pt reports mild nausea.    VITAL SIGNS: Temp:  [97.8 F (36.6 C)-100 F (37.8 C)] 97.8 F (36.6 C) (08/04 1300) Pulse Rate:  [69-92] 78 (08/04 1315) Resp:  [14-18] 14 (08/04 1315) BP: (109-144)/(51-61) 122/58 mmHg (08/04 1315) SpO2:  [94 %-98 %] 98 % (08/04 1300) Arterial Line BP: (122-140)/(44-58) 122/44 mmHg (08/04 1300)   HEMODYNAMICS:     VENTILATOR SETTINGS:     INTAKE / OUTPUT:  Intake/Output Summary (Last 24 hours) at 10/26/14 1348 Last data filed at 10/26/14 1300  Gross per 24 hour  Intake   1202 ml  Output   1325 ml  Net   -123 ml    PHYSICAL EXAMINATION: General:  Elderly female in NAD lying in bed Neuro:  Sedate, arouses to voice, MAE HEENT:  MM pink/moist, no JVD, edentulous  Cardiovascular:  s1s2 rrr, 3-4/6 SEM heard best over 2nd ICS RSB Lungs:  resp's even/non-labored, lungs bilaterally clear Abdomen:  Obese, soft.  RUQ dressing clean/dry/intact, L colostomy Musculoskeletal:  No acute deformities Skin:  Warm/dry, no edema, SCD's in place   LABS:  CBC  Recent Labs Lab 10/24/14 0500 10/25/14 0445 10/26/14 0350  WBC 11.8* 9.9 9.7  HGB 11.8* 10.2* 10.2*  HCT 38.7 34.4* 34.2*  PLT 141* 138* 141*   Coag's No results for input(s): APTT, INR in the last 168 hours.   BMET  Recent Labs Lab 10/24/14 0500 10/25/14 0445 10/26/14 0350  NA 135 140 138  K 3.8 3.8 4.8  CL 97* 102 100*  CO2 27 29 26   BUN 54* 54* 58*  CREATININE 3.03* 3.30* 3.85*  GLUCOSE 207* 87 84   Electrolytes  Recent Labs Lab 10/20/14 1525  10/24/14 0500 10/25/14 0445  10/26/14 0350  CALCIUM 9.0  < > 8.6* 8.2* 8.2*  PHOS 4.6  --   --   --   --   < > = values in this interval not displayed.   Sepsis Markers  Recent Labs Lab 10/24/14 0503  LATICACIDVEN 1.31   ABG No results for input(s): PHART, PCO2ART, PO2ART in the last 168 hours.   Liver Enzymes  Recent Labs Lab 10/20/14 1525 10/24/14 0500 10/26/14 0350  AST  --  17 17  ALT  --  8* 6*  ALKPHOS  --  117 82  BILITOT  --  0.8 1.3*  ALBUMIN 2.9* 2.7* 2.1*   Cardiac Enzymes No results for input(s): TROPONINI, PROBNP in the last 168 hours.   Glucose  Recent Labs Lab 10/25/14 1952 10/26/14 0011 10/26/14 0353 10/26/14 0714 10/26/14 0905 10/26/14 1216  GLUCAP 88 109* 88 88 91 88    Imaging No results found.   ASSESSMENT / PLAN:  PULMONARY A: At Risk Atelectasis - post op open cholecystectomy  Recurrent R Pleural Effusion s/p PleurX P:   Push pulmonary hygiene:  IS, mobilize as able  Intermittent CXR if new symptoms  Drain pleurX as needed based on CXR / dyspnea symptoms   CARDIOVASCULAR CVL A:  Chronic AF - not on anticoagulation  Severe Aortic Stenosis - needs AVR but needed gallbladder issues sorted out first Chronic diastolic CHF HTN DNR  P:  ICU monitoring of hemodynamics Continue ASA, Zocor, coreg DNR NS @ 75 ml/hr, consider reduction in am to [facility] (will be volume dependent)  RENAL A:   CKD IV - baseline sr cr ~ 2.9 P:   Trend BMP / UOP  Replace electrolytes as indicated  Received lasix intra-op   GASTROINTESTINAL A:   Cholecystitis s/p Open Cholecystectomy  Nausea  Morbid Obesity  Colostomy  Hx Colon Cancer - remote, 1980's P:   Defer surgical management to CCS PRN Zofran NPO x ice chips /  sips of clear liquids for meds (per [facility])  HEMATOLOGIC A:   Anemia  Chronic Thrombocytopenia  P:  Trend CBC DVT Prophylaxis:  Heparin SQ + SCD's  INFECTIOUS A:   Cholecystitis s/p Cholecystectomy  E-Coli UTI  Hx C-Diff  P:   UC 8/2 >> E-Coli  >> sens zosyn   Zosyn, start date 8/2, day 3/x Florastor while on ABX Monitor fever curve / WBC  ENDOCRINE A:   DM II Diabetic Retinopathy  P:   SSI   NEUROLOGIC A:   Post-Operative Pain  Depression / Anxiety  P:   RASS goal: 0 PRN morphine for pain  Hold  Remeron until 8/5 pm   FAMILY  - Updates:  No family available at time of assessment.    PCCM will follow while in ICU.  Hopeful to monitor her overnight and if stable transfer out in the am.     Noe Gens, NP-C [facility] Pgr: [phone] or if no answer [phone] 10/26/2014, 1:48 PM

## 2014-10-26 NOTE — Progress Notes (Signed)
    Currently in surgery.  Severe aortic stenosis. Will continue to follow.  Candee Furbish, MD

## 2014-10-26 NOTE — Progress Notes (Signed)
Patient arrived from PACU. SHe is drowsy but follows commands. Vitals stable BP 132/51 HR 70 RR 22 Sats 96% on 2L nasal cannula. Normal saline infusing at 100 ml/hr. Foley in place. Art line in place.

## 2014-10-26 NOTE — Transfer of Care (Signed)
Immediate Anesthesia Transfer of Care Note  Patient: [name]  Procedure(s) Performed: Procedure(s): OPEN CHOLECYSTECTOMY (N/A)  Patient Location: PACU  Anesthesia Type:General  Level of Consciousness: awake, alert  and patient cooperative  Airway & Oxygen Therapy: Patient Spontanous Breathing and Patient connected to nasal cannula oxygen  Post-op Assessment: Report given to RN, Post -op Vital signs reviewed and stable and Patient moving all extremities  Post vital signs: Reviewed and stable  Last Vitals:  Filed Vitals:   10/26/14 0848  BP: 123/51  Pulse: 70  Temp:   Resp:     Complications: No apparent anesthesia complications

## 2014-10-26 NOTE — Progress Notes (Signed)
[facility] PROGRESS NOTE  [name] IRC:[id number] DOB: June 16, 1925 DOA: 10/24/2014 PCP: Abigail Miyamoto, MD  Assessment/Plan: 1. Acute cholecystitis. H/o cholecystitis, choledocholithiasis with recent  Biliary drainage perc cholecystostomy tube (04/2014) and ERCP (06/2014) -CT abdomen: Enlarged gallbladder contains small calcified stones with surrounding inflammation suggestive of acute cholecystitis -continue NPO, IV zosyn -Open cholecystectomy today, patient and family understand high risk of complications and cardiopulmonary morbidity/mortality -Appreciate cardiology input  -plan for post op ICU per [facility]CS  2. CHF recurrent pleural effusion s/p Pleurex, Severe Aortic Stenosis. Echo (08/2014). LVEF: 65-70%. Severe aortic stenosis with Mean gradient (S): 34 mm Hg. -Patient does not appear in acute CHF. We will cont monitor closely fluid balance, I/O, daily weight. -stop normal saline at 50 mL an hour, few basilar crackles, i ordered 60mg  IV lasix x1 before OR today -Resume Lasix PRN  3. A fib (no on AC due to risk bleeding/fall).  - cont BB, ASA  4. CKD IV. Patient with chronic edema. Creatinine higher than baseline, held by mouth Lasix -monitor fluid status closely  5. DM. -Stable, sliding scale insulin  6. History of colon cancer/colostomy  DVT prophylaxis with heparin subcutaneous  Code Status: DNR Family Communication: daughter/extended family at bedside Disposition Plan: Home when stable   Consultants:  CARDs  CCS  GI   HPI/Subjective: -Reports improvement in abdominal pain, no nausea or vomiting -Breathing at baseline  Objective: Filed Vitals:   10/26/14 1345  BP: 132/51  Pulse: 68  Temp:   Resp: 18    Intake/Output Summary (Last 24 hours) at 10/26/14 1402 Last data filed at 10/26/14 1300  Gross per 24 hour  Intake   1202 ml  Output   1325 ml  Net   -123 ml   Filed Weights   10/26/14 1345  Weight: 81.1 kg (178 lb 12.7 oz)     Exam:   Gen:  alert awake range at 3, appears stated age, no distress   cardiovascular: S1-S2, loud ejection systolic murmur,   Resp: decreased breath sounds especially at right base  Abdomen: Soft, mild diffuse tenderness no rigidity or rebound, diminished bowel sounds  Musculoskeletal:  trace edema, no clubbing or cyanosis   Data Reviewed: Basic Metabolic Panel:  Recent Labs Lab 10/20/14 1525 10/20/14 1526 10/24/14 0500 10/25/14 0445 10/26/14 0350  NA 142  --  135 140 138  K 3.6  --  3.8 3.8 4.8  CL 102  --  97* 102 100*  CO2 26  --  27 29 26   GLUCOSE 204*  --  207* 87 84  BUN 48*  --  54* 54* 58*  CREATININE 2.97*  --  3.03* 3.30* 3.85*  CALCIUM 9.0 8.9 8.6* 8.2* 8.2*  PHOS 4.6  --   --   --   --    Liver Function Tests:  Recent Labs Lab 10/20/14 1525 10/24/14 0500 10/26/14 0350  AST  --  17 17  ALT  --  8* 6*  ALKPHOS  --  117 82  BILITOT  --  0.8 1.3*  PROT  --  6.0* 5.5*  ALBUMIN 2.9* 2.7* 2.1*    Recent Labs Lab 10/24/14 0500  LIPASE 58*   No results for input(s): AMMONIA in the last 168 hours. CBC:  Recent Labs Lab 10/20/14 1451 10/24/14 0500 10/25/14 0445 10/26/14 0350  WBC  --  11.8* 9.9 9.7  NEUTROABS  --  9.7*  --   --   HGB 11.7* 11.8* 10.2* 10.2*  HCT  --  38.7 34.4* 34.2*  MCV  --  94.6 97.5 95.8  PLT  --  141* 138* 141*   Cardiac Enzymes: No results for input(s): CKTOTAL, CKMB, CKMBINDEX, TROPONINI in the last 168 hours. BNP (last 3 results)  Recent Labs  03/15/14 0914 07/14/14 1125  BNP 941.4* 526.1*    ProBNP (last 3 results)  Recent Labs  09/08/14 1517  PROBNP 312.0*    CBG:  Recent Labs Lab 10/26/14 0011 10/26/14 0353 10/26/14 0714 10/26/14 0905 10/26/14 1216  GLUCAP 109* 88 88 91 88    Recent Results (from the past 240 hour(s))  Urine culture     Status: None   Collection Time: 10/24/14  6:03 AM  Result Value Ref Range Status   Specimen Description URINE, RANDOM  Final   Special  Requests ADDED [id number]  Final   Culture >=100,000 COLONIES/mL ESCHERICHIA COLI  Final   Report Status 10/26/2014 FINAL  Final   Organism ID, Bacteria ESCHERICHIA COLI  Final      Susceptibility   Escherichia coli - MIC*    AMPICILLIN <=2 SENSITIVE Sensitive     CEFAZOLIN <=4 SENSITIVE Sensitive     CEFTRIAXONE <=1 SENSITIVE Sensitive     CIPROFLOXACIN <=0.25 SENSITIVE Sensitive     GENTAMICIN <=1 SENSITIVE Sensitive     IMIPENEM <=0.25 SENSITIVE Sensitive     NITROFURANTOIN <=16 SENSITIVE Sensitive     TRIMETH/SULFA <=20 SENSITIVE Sensitive     AMPICILLIN/SULBACTAM <=2 SENSITIVE Sensitive     PIP/TAZO <=4 SENSITIVE Sensitive     * >=100,000 COLONIES/mL ESCHERICHIA COLI  Surgical pcr screen     Status: None   Collection Time: 10/26/14  5:33 AM  Result Value Ref Range Status   MRSA, PCR NEGATIVE NEGATIVE Final   Staphylococcus aureus NEGATIVE NEGATIVE Final    Comment:        The Xpert SA Assay (FDA approved for NASAL specimens in patients over 52 years of age), is one component of a comprehensive surveillance program.  Test performance has been validated by [facility] for patients greater than or equal to 40 year old. It is not intended to diagnose infection nor to guide or monitor treatment.      Studies: No results found.  Scheduled Meds: . aspirin EC  81 mg Oral QHS  . calcitRIOL  0.25 mcg Oral Daily  . carvedilol  25 mg Oral BID  . fentaNYL      . furosemide  60 mg Intravenous Once  . [START ON 10/27/2014] heparin  5,000 Units Subcutaneous 3 times per day  . insulin aspart  0-9 Units Subcutaneous 6 times per day  . LORazepam  0.5 mg Oral BID  . mirtazapine  7.5 mg Oral QHS  . piperacillin-tazobactam (ZOSYN)  IV  2.25 g Intravenous 3 times per day  . saccharomyces boulardii  250 mg Oral BID  . simvastatin  20 mg Oral q1800  . sodium chloride  3 mL Intravenous Q12H   Continuous Infusions: . sodium chloride     Antibiotics Given (last 72 hours)     Date/Time Action Medication Dose Rate   10/24/14 1300 Given   piperacillin-tazobactam (ZOSYN) IVPB 2.25 g 2.25 g 100 mL/hr   10/24/14 2151 Given   piperacillin-tazobactam (ZOSYN) IVPB 2.25 g 2.25 g 100 mL/hr   10/25/14 0512 Given   piperacillin-tazobactam (ZOSYN) IVPB 2.25 g 2.25 g 100 mL/hr   10/25/14 1617 Given  [loss of iv]   piperacillin-tazobactam (ZOSYN) IVPB 2.25  g 2.25 g 100 mL/hr   10/25/14 2132 Given   piperacillin-tazobactam (ZOSYN) IVPB 2.25 g 2.25 g 100 mL/hr   10/26/14 0516 Given   piperacillin-tazobactam (ZOSYN) IVPB 2.25 g 2.25 g 100 mL/hr      Active Problems:   Acute cholecystitis   Choledocholithiasis with acute cholecystitis   Aortic stenosis, severe   Preoperative cardiovascular examination    Time spent: 72min    Manila Rommel  [facility] Pager [phone]. If 7PM-7AM, please contact night-coverage at [email or web address], password [facility] 10/26/2014, 2:02 PM  LOS: 2 days

## 2014-10-26 NOTE — Care Management Important Message (Signed)
Important Message  Patient Details  Name: [name] MRN: [id number] Date of Birth: Sep 16, 1925   Medicare Important Message Given:  Yes-second notification given    Delorse Lek 10/26/2014, 2:33 PM

## 2014-10-26 NOTE — Progress Notes (Signed)
Day of Surgery  Subjective: ruq pain unchanged  Objective: Vital signs in last 24 hours: Temp:  [98.7 F (37.1 C)-100 F (37.8 C)] 99 F (37.2 C) (08/04 0510) Pulse Rate:  [72-92] 78 (08/04 0510) Resp:  [16-18] 18 (08/04 0510) BP: (104-119)/(51-59) 119/51 mmHg (08/04 0510) SpO2:  [94 %-100 %] 94 % (08/04 0510) Last BM Date: 10/24/14  Intake/Output from previous day: 08/03 0701 - 08/04 0700 In: 1422 [P.O.:720; I.V.:702] Out: 1200 [Urine:1200] Intake/Output this shift:    GI: tender ruq pleurx catheter in place  Lab Results:   Recent Labs  10/25/14 0445 10/26/14 0350  WBC 9.9 9.7  HGB 10.2* 10.2*  HCT 34.4* 34.2*  PLT 138* 141*   BMET  Recent Labs  10/25/14 0445 10/26/14 0350  NA 140 138  K 3.8 4.8  CL 102 100*  CO2 29 26  GLUCOSE 87 84  BUN 54* 58*  CREATININE 3.30* 3.85*  CALCIUM 8.2* 8.2*   PT/INR No results for input(s): LABPROT, INR in the last 72 hours. ABG No results for input(s): PHART, HCO3 in the last 72 hours.  Invalid input(s): PCO2, PO2  Studies/Results: No results found.  Anti-infectives: Anti-infectives    Start     Dose/Rate Route Frequency Ordered Stop   10/24/14 1100  piperacillin-tazobactam (ZOSYN) IVPB 2.25 g     2.25 g 100 mL/hr over 30 Minutes Intravenous 3 times per day 10/24/14 1035     10/24/14 0730  piperacillin-tazobactam (ZOSYN) IVPB 3.375 g     3.375 g 100 mL/hr over 30 Minutes Intravenous  Once 10/24/14 0037 10/24/14 0488      Assessment/Plan: Open cholecystectomy today  Discussed procedure again as well as risks. Family and patient well aware of all of this.  Will proceed with open chole today  [facility] 10/26/2014

## 2014-10-26 NOTE — Anesthesia Postprocedure Evaluation (Signed)
  Anesthesia Post-op Note  Patient: [name]  Procedure(s) Performed: Procedure(s): OPEN CHOLECYSTECTOMY (N/A)  Patient Location: PACU  Anesthesia Type:General  Level of Consciousness: awake and alert   Airway and Oxygen Therapy: Patient Spontanous Breathing  Post-op Pain: mild  Post-op Assessment: Post-op Vital signs reviewed, Patient's Cardiovascular Status Stable, Respiratory Function Stable, Patent Airway and No signs of Nausea or vomiting              Post-op Vital Signs: Reviewed and stable  Last Vitals:  Filed Vitals:   10/26/14 1345  BP: 132/51  Pulse: 68  Temp:   Resp: 18    Complications: No apparent anesthesia complications

## 2014-10-26 NOTE — Anesthesia Procedure Notes (Signed)
Procedure Name: Intubation Date/Time: 10/26/2014 10:34 AM Performed by: Melina Copa, Sigmund Morera R Pre-anesthesia Checklist: Patient identified, Emergency Drugs available, Suction available, Patient being monitored and Timeout performed Patient Re-evaluated:Patient Re-evaluated prior to inductionOxygen Delivery Method: Circle system utilized Preoxygenation: Pre-oxygenation with 100% oxygen Intubation Type: IV induction Ventilation: Mask ventilation without difficulty Laryngoscope Size: Miller and 2 Grade View: Grade II Tube type: Oral Tube size: 7.5 mm Number of attempts: 1 Airway Equipment and Method: Stylet Placement Confirmation: ETT inserted through vocal cords under direct vision,  positive ETCO2 and breath sounds checked- equal and bilateral Secured at: 22 cm Tube secured with: Tape Dental Injury: Teeth and Oropharynx as per pre-operative assessment

## 2014-10-26 NOTE — Op Note (Signed)
Preoperative diagnosis: acute cholecystitis Postoperative diagnosis: same as above Procedure: open cholecystectomy Surgeon: Dr Serita Grammes Asst: Dr Georganna Skeans Anesthesia: general EBL: minimal Drains none Specimen gb and contents to pathology Complications: none Sponge count correct at completion Disposition to recovery stable  Indications: This is an 34 yof with long history of gb disease and aortic stenosis.  She has cholecystitis again. After multiple conversations we elected to proceed with open cholecystectomy due to as and multiple prior operations.   Procedure: After informed consent was obtained the patient was taken to the operating room. She was already given antibiotics. Sequential compression devices were on her legs. She had an arterial line placed. She was placed under general anesthesia without complication. Her abdomen was prepped and draped in the standard sterile surgical fashion. A surgical timeout was then performed.  I made a ruq incision and carried this through the muscle. I stayed away from where the pleurx catheter was placed and tunneled.  I then entered into the peritoneum without difficulty.  I lysed some adhesions and then identified the gallbladder. This had acute on chronic cholecystitis.  I then removed the gallbladder from the liver down to the triangle.  There was purulence in the gallbladder.  It was also partially ischemic.  I then identified both the cystic artery and duct. The artery was clipped and divided.  The duct was clipped and tied with a vicryl suture. The gallbladder was then removed.  Hemostasis was obtained. I did place surgicel snow in the liver.  Irrigation was performed.  I then closed with #1 PDS.  The subq tissue was irrigated. The skin was then closed with staples. Dressing was placed.  She tolerated this well was extubated and transferred to the recovery room in stable condition

## 2014-10-26 NOTE — Progress Notes (Signed)
I spoke with Dr. Clint Lipps to see if he wants [place] to give lasix 60mg  as ordered prior to patient coming to Short Stay.  He said it would be okay to give the lasix.  Call to Dr. Rolm Bookbinder to ask about foley catheter.  He stated that he wants the foley put in in the OR not in the pre-op area. Earnest Bailey, CRNA given this information.

## 2014-10-27 ENCOUNTER — Encounter (HOSPITAL_COMMUNITY): Payer: Self-pay | Admitting: General Surgery

## 2014-10-27 ENCOUNTER — Inpatient Hospital Stay (HOSPITAL_COMMUNITY): Payer: Medicare Other

## 2014-10-27 DIAGNOSIS — K819 Cholecystitis, unspecified: Secondary | ICD-10-CM

## 2014-10-27 LAB — COMPREHENSIVE METABOLIC PANEL
ALBUMIN: 2 g/dL — AB (ref 3.5–5.0)
ALK PHOS: 79 U/L (ref 38–126)
ALT: 8 U/L — AB (ref 14–54)
AST: 19 U/L (ref 15–41)
Anion gap: 17 — ABNORMAL HIGH (ref 5–15)
BUN: 63 mg/dL — AB (ref 6–20)
CALCIUM: 8.3 mg/dL — AB (ref 8.9–10.3)
CHLORIDE: 102 mmol/L (ref 101–111)
CO2: 21 mmol/L — AB (ref 22–32)
CREATININE: 4.06 mg/dL — AB (ref 0.44–1.00)
GFR calc Af Amer: 11 mL/min — ABNORMAL LOW (ref >60)
GFR calc non Af Amer: 9 mL/min — ABNORMAL LOW (ref >60)
GLUCOSE: 152 mg/dL — AB (ref 65–99)
POTASSIUM: 4.3 mmol/L (ref 3.5–5.1)
SODIUM: 140 mmol/L (ref 135–145)
TOTAL PROTEIN: 5.5 g/dL — AB (ref 6.5–8.1)
Total Bilirubin: 1.4 mg/dL — ABNORMAL HIGH (ref 0.3–1.2)

## 2014-10-27 LAB — GLUCOSE, CAPILLARY
GLUCOSE-CAPILLARY: 123 mg/dL — AB (ref 65–99)
GLUCOSE-CAPILLARY: 131 mg/dL — AB (ref 65–99)
GLUCOSE-CAPILLARY: 142 mg/dL — AB (ref 65–99)
GLUCOSE-CAPILLARY: 144 mg/dL — AB (ref 65–99)
GLUCOSE-CAPILLARY: 158 mg/dL — AB (ref 65–99)
Glucose-Capillary: 136 mg/dL — ABNORMAL HIGH (ref 65–99)

## 2014-10-27 LAB — CBC
HCT: 34.1 % — ABNORMAL LOW (ref 36.0–46.0)
Hemoglobin: 10.4 g/dL — ABNORMAL LOW (ref 12.0–15.0)
MCH: 29.1 pg (ref 26.0–34.0)
MCHC: 30.5 g/dL (ref 30.0–36.0)
MCV: 95.5 fL (ref 78.0–100.0)
Platelets: 146 10*3/uL — ABNORMAL LOW (ref 150–400)
RBC: 3.57 MIL/uL — AB (ref 3.87–5.11)
RDW: 15.7 % — ABNORMAL HIGH (ref 11.5–15.5)
WBC: 15.3 10*3/uL — ABNORMAL HIGH (ref 4.0–10.5)

## 2014-10-27 LAB — PHOSPHORUS: Phosphorus: 7.9 mg/dL — ABNORMAL HIGH (ref 2.5–4.6)

## 2014-10-27 MED ORDER — TRAMADOL HCL 50 MG PO TABS
100.0000 mg | ORAL_TABLET | Freq: Two times a day (BID) | ORAL | Status: DC | PRN
  Administered 2014-10-27 – 2014-10-29 (×2): 100 mg via ORAL
  Filled 2014-10-27 (×3): qty 2

## 2014-10-27 MED ORDER — FENTANYL CITRATE (PF) 100 MCG/2ML IJ SOLN: 25.0000 ug | INTRAMUSCULAR | Status: DC | PRN

## 2014-10-27 MED ORDER — DEXTROSE 5 % IV SOLN
1.0000 g | INTRAVENOUS | Status: DC
  Administered 2014-10-27: 1 g via INTRAVENOUS
  Filled 2014-10-27 (×2): qty 10

## 2014-10-27 NOTE — Consult Note (Addendum)
WOC wound consult note Reason for Consult: Consult requested for ostomy assistance. Pt is familiar to [facility] team from previous admissions.  Pt is independent with ostomy pouch application and emptying, ostomy has been in place for 30 years. She denies questions or problems with pouching routines. Current pouch intact with good seal to LLQ, small amt brown unformed stool. Pt uses 2 inch Kayara pouches and supplies ordered to bedside. Pt states she prefers to change her own pouch and this was performed 2 days ago. No role for wound or ostomy services at this time. Please re-consult if further assistance is needed. Thank-you,  Julien Girt MSN, [place], [place], [place], [place]

## 2014-10-27 NOTE — Progress Notes (Signed)
1 Day Post-Op  Subjective: Feels ok, hungry, pain fair control  Objective: Vital signs in last 24 hours: Temp:  [97.3 F (36.3 C)-98.1 F (36.7 C)] 97.3 F (36.3 C) (08/05 0817) Pulse Rate:  [67-93] 73 (08/05 0700) Resp:  [14-28] 18 (08/05 0700) BP: (110-150)/(43-85) 110/59 mmHg (08/05 0700) SpO2:  [95 %-99 %] 98 % (08/05 0700) Arterial Line BP: (106-140)/(40-60) 106/41 mmHg (08/05 0700) Weight:  [81.1 kg (178 lb 12.7 oz)] 81.1 kg (178 lb 12.7 oz) (08/04 1345) Last BM Date: 10/26/14  Intake/Output from previous day: 08/04 0701 - 08/05 0700 In: 1773.8 [I.V.:1323.8; IV Piggyback:300] Out: 4818 [Urine:1195] Intake/Output this shift:    GI: dressing dry, air in bag, approp tender  Lab Results:   Recent Labs  10/26/14 0350 10/27/14 0500  WBC 9.7 15.3*  HGB 10.2* 10.4*  HCT 34.2* 34.1*  PLT 141* 146*   BMET  Recent Labs  10/26/14 0350 10/27/14 0220  NA 138 140  K 4.8 4.3  CL 100* 102  CO2 26 21*  GLUCOSE 84 152*  BUN 58* 63*  CREATININE 3.85* 4.06*  CALCIUM 8.2* 8.3*   PT/INR No results for input(s): LABPROT, INR in the last 72 hours. ABG No results for input(s): PHART, HCO3 in the last 72 hours.  Invalid input(s): PCO2, PO2  Studies/Results: Dg Chest Port 1 View  10/27/2014   CLINICAL DATA:  Pleural effusion.  EXAM: PORTABLE CHEST - 1 VIEW  COMPARISON:  10/24/2014 .  CT 08/30/2014.  FINDINGS: Right chest tube in stable position . No pneumothorax on today's exam. Cardiomegaly with bilateral pulmonary interstitial prominence and bilateral pleural effusions, right side greater than left noted. Right apical pleural thickening noted consistent with pleural effusion.  IMPRESSION: 1. Right chest tube in stable position. No pneumothorax noted on today's exam. 2. Congestive heart failure with bilateral pulmonary interstitial edema and bilateral pleural effusion. Right pleural effusion is prominent.   Electronically Signed   By: Marcello Moores  Register   On: 10/27/2014 07:39     Anti-infectives: Anti-infectives    Start     Dose/Rate Route Frequency Ordered Stop   10/24/14 1100  piperacillin-tazobactam (ZOSYN) IVPB 2.25 g     2.25 g 100 mL/hr over 30 Minutes Intravenous 3 times per day 10/24/14 1035     10/24/14 0730  piperacillin-tazobactam (ZOSYN) IVPB 3.375 g     3.375 g 100 mL/hr over 30 Minutes Intravenous  Once 10/24/14 0718 10/24/14 0832      Assessment/Plan: POD 1 open chole  Can transfer from my standpoint Will give clear liquids, Needs oob   [facility] 10/27/2014

## 2014-10-27 NOTE — Consult Note (Signed)
PULMONARY / CRITICAL CARE MEDICINE   Name: [name] MRN: [id number] DOB: 01-17-1926    ADMISSION DATE:  10/24/2014 CONSULTATION DATE:  10/26/14  REFERRING MD :  Dr. Donne Hazel  CHIEF COMPLAINT:  Post-operative open cholecystectomy   INITIAL PRESENTATION: 81 y/o F with multiple medical problems to include severe aortic stenosis (pending need for AVR) and gallbladder disease s/p perc cholecystostomy (04/2014) who was admitted 8/2 with recurrent cholecystitis.  The patient underwent open cholecystectomy on 8/4 per Dr. Donne Hazel and was returned to ICU for observation.  PCCM consulted to assist with ICU care.    STUDIES:  6/01  ECHO >> LVEF 65-70%, severe AS, pa peak pressure 34  SIGNIFICANT EVENTS: 8/02  Admit with cholecystitis  8/04  Open cholecystectomy   SUBJECTIVE: normal BP, resp status   VITAL SIGNS: Temp:  [97.3 F (36.3 C)-98.1 F (36.7 C)] 97.3 F (36.3 C) (08/05 0817) Pulse Rate:  [67-93] 73 (08/05 0700) Resp:  [14-28] 18 (08/05 0700) BP: (110-150)/(43-85) 110/59 mmHg (08/05 0700) SpO2:  [95 %-99 %] 98 % (08/05 0700) Arterial Line BP: (106-140)/(40-60) 106/41 mmHg (08/05 0700) Weight:  [81.1 kg (178 lb 12.7 oz)] 81.1 kg (178 lb 12.7 oz) (08/04 1345)   HEMODYNAMICS:     VENTILATOR SETTINGS:     INTAKE / OUTPUT:  Intake/Output Summary (Last 24 hours) at 10/27/14 1001 Last data filed at 10/27/14 0600  Gross per 24 hour  Intake 1773.75 ml  Output    845 ml  Net 928.75 ml    PHYSICAL EXAMINATION: General:  Elderly female in NAD lying in bed Neuro:  Awake, oriented " i want some water" HEENT: jvd wnl Cardiovascular:  s1s2 rrr, 3-4/6 SEM heard best over 2nd ICS RSB Lungs:  CTA Abdomen:  Obese, soft.  RUQ dressing clean/dry/intact, L colostomy Musculoskeletal:  No acute deformities Skin:  Warm/dry, no edema, SCD's in place   LABS:  CBC  Recent Labs Lab 10/25/14 0445 10/26/14 0350 10/27/14 0500  WBC 9.9 9.7 15.3*  HGB 10.2* 10.2* 10.4*  HCT 34.4*  34.2* 34.1*  PLT 138* 141* 146*   Coag's No results for input(s): APTT, INR in the last 168 hours.   BMET  Recent Labs Lab 10/25/14 0445 10/26/14 0350 10/27/14 0220  NA 140 138 140  K 3.8 4.8 4.3  CL 102 100* 102  CO2 29 26 21*  BUN 54* 58* 63*  CREATININE 3.30* 3.85* 4.06*  GLUCOSE 87 84 152*   Electrolytes  Recent Labs Lab 10/20/14 1525  10/25/14 0445 10/26/14 0350 10/27/14 0220  CALCIUM 9.0  < > 8.2* 8.2* 8.3*  PHOS 4.6  --   --   --  7.9*  < > = values in this interval not displayed.   Sepsis Markers  Recent Labs Lab 10/24/14 0503  LATICACIDVEN 1.31   ABG No results for input(s): PHART, PCO2ART, PO2ART in the last 168 hours.   Liver Enzymes  Recent Labs Lab 10/24/14 0500 10/26/14 0350 10/27/14 0220  AST 17 17 19   ALT 8* 6* 8*  ALKPHOS 117 82 79  BILITOT 0.8 1.3* 1.4*  ALBUMIN 2.7* 2.1* 2.0*   Cardiac Enzymes No results for input(s): TROPONINI, PROBNP in the last 168 hours.   Glucose  Recent Labs Lab 10/26/14 1342 10/26/14 1559 10/26/14 2002 10/26/14 2358 10/27/14 0340 10/27/14 0814  GLUCAP 96 97 108* 142* 158* 136*    Imaging Dg Chest Port 1 View  10/27/2014   CLINICAL DATA:  Pleural effusion.  EXAM: PORTABLE CHEST -  1 VIEW  COMPARISON:  10/24/2014 .  CT 08/30/2014.  FINDINGS: Right chest tube in stable position . No pneumothorax on today's exam. Cardiomegaly with bilateral pulmonary interstitial prominence and bilateral pleural effusions, right side greater than left noted. Right apical pleural thickening noted consistent with pleural effusion.  IMPRESSION: 1. Right chest tube in stable position. No pneumothorax noted on today's exam. 2. Congestive heart failure with bilateral pulmonary interstitial edema and bilateral pleural effusion. Right pleural effusion is prominent.   Electronically Signed   By: Marcello Moores  Register   On: 10/27/2014 07:39     ASSESSMENT / PLAN:  PULMONARY A: At Risk Atelectasis - post op open cholecystectomy   Recurrent R Pleural Effusion s/p PleurX P:   Push pulmonary hygiene:  IS, mobilize as able  Intermittent CXR if new symptoms  Drain pleurX as needed based on CXR / dyspnea symptoms IS important, get out of bed as able Follow up pcxr May need mucomysts  CARDIOVASCULAR CVL A:  Chronic AF - not on anticoagulation  Severe Aortic Stenosis - needs AVR but needed gallbladder issues sorted out first Chronic diastolic CHF HTN DNR  P:  ICU monitoring of hemodynamics Continue ASA, Zocor, coreg DNR NS @ 75 ml/hr, kvo  No lasix , AS  RENAL A:   CKD IV - baseline sr cr ~ 2.9 ATN P:   Trend BMP / UOP  Replace electrolytes as indicated  Hold further lasix, follow bmet trend  GASTROINTESTINAL A:   Cholecystitis s/p Open Cholecystectomy  Nausea  Morbid Obesity  Colostomy  Hx Colon Cancer - remote, 1980's P:   Defer surgical management to CCS - clears started PRN Zofran  HEMATOLOGIC A:   Anemia  Chronic Thrombocytopenia  P:  Trend CBC DVT Prophylaxis:  Heparin SQ + SCD's  INFECTIOUS A:   Cholecystitis s/p Cholecystectomy  E-Coli UTI  Hx C-Diff  P:   UC 8/2 >> E-Coli >> sens zosyn   Zosyn, start date 8/2, day 3/x Florastor while on ABX Monitor fever curve / WBC  Consider narrow to ceftriaxone Add stop date 7 days total abx  ENDOCRINE A:   DM II Diabetic Retinopathy  P:   SSI   NEUROLOGIC A:   Post-Operative Pain  Depression / Anxiety  P:   RASS goal: 0 PRN morphine for pain  Hold Remeron until 8/5 pm   FAMILY  - Updates:  No family available at time of assessment.  To triad tele  Lavon Paganini. Titus Mould, MD, [place] Pgr: [facility]

## 2014-10-27 NOTE — Progress Notes (Signed)
    Subjective: Post op chole.  No SOB.    Objective: Vital signs in last 24 hours: Temp:  [97.3 F (36.3 C)-98.1 F (36.7 C)] 98 F (36.7 C) (08/05 1204) Pulse Rate:  [67-93] 73 (08/05 0700) Resp:  [14-28] 18 (08/05 0700) BP: (110-150)/(43-85) 110/59 mmHg (08/05 0700) SpO2:  [95 %-99 %] 98 % (08/05 0700) Arterial Line BP: (106-140)/(40-60) 106/41 mmHg (08/05 0700) Weight:  [178 lb 12.7 oz (81.1 kg)] 178 lb 12.7 oz (81.1 kg) (08/04 1345) Last BM Date: 10/26/14  Intake/Output from previous day: 08/04 0701 - 08/05 0700 In: 1773.8 [I.V.:1323.8; IV Piggyback:300] Out: 1195 [Urine:1195] Intake/Output this shift:    Medications Scheduled Meds: . aspirin EC  81 mg Oral QHS  . calcitRIOL  0.25 mcg Oral Daily  . carvedilol  25 mg Oral BID  . cefTRIAXone (ROCEPHIN)  IV  1 g Intravenous Q24H  . heparin  5,000 Units Subcutaneous 3 times per day  . insulin aspart  0-9 Units Subcutaneous 6 times per day  . LORazepam  0.5 mg Oral BID  . mirtazapine  7.5 mg Oral QHS  . saccharomyces boulardii  250 mg Oral BID  . simvastatin  20 mg Oral q1800  . sodium chloride  3 mL Intravenous Q12H   Continuous Infusions:   PRN Meds:.sodium chloride, acetaminophen **OR** acetaminophen, fentaNYL (SUBLIMAZE) injection, fentaNYL (SUBLIMAZE) injection, levalbuterol, ondansetron (ZOFRAN) IV, traMADol  PE: General appearance: alert, cooperative and no distress Lungs: minimal crackles Heart: irregularly irregular rhythm and 3/6 sys MM Abdomen: +BS, Scar noted Extremities: Trace LEE Pulses: 2+ and symmetric Skin: Warm and dry Neurologic: Grossly normal  Lab Results:   Recent Labs  10/25/14 0445 10/26/14 0350 10/27/14 0500  WBC 9.9 9.7 15.3*  HGB 10.2* 10.2* 10.4*  HCT 34.4* 34.2* 34.1*  PLT 138* 141* 146*   BMET  Recent Labs  10/25/14 0445 10/26/14 0350 10/27/14 0220  NA 140 138 140  K 3.8 4.8 4.3  CL 102 100* 102  CO2 29 26 21*  GLUCOSE 87 84 152*  BUN 54* 58* 63*  CREATININE  3.30* 3.85* 4.06*  CALCIUM 8.2* 8.2* 8.3*      Assessment/Plan      Choledocholithiasis with acute cholecystitis  Lap Chole    Aortic stenosis, severe  Being considered for TAVR.  Followed by Dr. Roxy Manns and [name]    Chronic diastolic CHF  EF 44-62% on recent echo 08/23/14.  She appears euvolemic.     Chronic Atrial Fibrillation  Rate well controlled.  No anticoagulation secondary to fall risk.    chronic right pleural effusion with an indwelling drainage catheter  Small effusion on CXR.     Renal function is worse today - Creat 4.06 from 3.85 from 3.3. Avoid lasix for now. Oral hydration. Be careful with IV fluids.  Candee Furbish, MD    LOS: 3 days    Candee Furbish MD  10/27/2014

## 2014-10-27 NOTE — Progress Notes (Signed)
Patient trasfered from 53M to 5W11 via wheelchair; alert and oriented x 4; no complaints of pain; IV saline locked in RFA; skin - surgical site on abdomen midline with dressing; colostomy on LLQ and closed system drain on RUQ with dressing; no pressure ulcer; foam dressing on sacral area for protection. Skin was checked by nurse Ginger and nurse Lorella Gomez. Orient patient to room and unit; instructed how to use the call bell and  fall risk precautions. Will continue to monitor the patient.

## 2014-10-27 NOTE — Progress Notes (Signed)
[facility] Progress Note    Since last GI note: Open cholecystectomy yesterday.  Went smoothly from what I can tell. This morning she has mild abd pain, wants water.  Objective: Vital signs in last 24 hours: Temp:  [97.3 F (36.3 C)-98.1 F (36.7 C)] 97.3 F (36.3 C) (08/05 0817) Pulse Rate:  [67-93] 73 (08/05 0700) Resp:  [14-28] 18 (08/05 0700) BP: (110-150)/(43-85) 110/59 mmHg (08/05 0700) SpO2:  [95 %-99 %] 98 % (08/05 0700) Arterial Line BP: (106-140)/(40-60) 106/41 mmHg (08/05 0700) Weight:  [178 lb 12.7 oz (81.1 kg)] 178 lb 12.7 oz (81.1 kg) (08/04 1345) Last BM Date: 10/26/14 General: alert and oriented times 3 Heart: regular rate and rythm Abdomen: soft, non-tender, non-distended, + bowel sounds   Lab Results:  Recent Labs  10/25/14 0445 10/26/14 0350 10/27/14 0500  WBC 9.9 9.7 15.3*  HGB 10.2* 10.2* 10.4*  PLT 138* 141* 146*  MCV 97.5 95.8 95.5    Recent Labs  10/25/14 0445 10/26/14 0350 10/27/14 0220  NA 140 138 140  K 3.8 4.8 4.3  CL 102 100* 102  CO2 29 26 21*  GLUCOSE 87 84 152*  BUN 54* 58* 63*  CREATININE 3.30* 3.85* 4.06*  CALCIUM 8.2* 8.2* 8.3*    Recent Labs  10/26/14 0350 10/27/14 0220  PROT 5.5* 5.5*  ALBUMIN 2.1* 2.0*  AST 17 19  ALT 6* 8*  ALKPHOS 82 79  BILITOT 1.3* 1.4*  Studies/Results: Dg Chest Port 1 View  10/27/2014   CLINICAL DATA:  Pleural effusion.  EXAM: PORTABLE CHEST - 1 VIEW  COMPARISON:  10/24/2014 .  CT 08/30/2014.  FINDINGS: Right chest tube in stable position . No pneumothorax on today's exam. Cardiomegaly with bilateral pulmonary interstitial prominence and bilateral pleural effusions, right side greater than left noted. Right apical pleural thickening noted consistent with pleural effusion.  IMPRESSION: 1. Right chest tube in stable position. No pneumothorax noted on today's exam. 2. Congestive heart failure with bilateral pulmonary interstitial edema and bilateral pleural effusion. Right pleural  effusion is prominent.   Electronically Signed   By: Marcello Moores  Register   On: 10/27/2014 07:39     Medications: Scheduled Meds: . antiseptic oral rinse  7 mL Mouth Rinse q12n4p  . aspirin EC  81 mg Oral QHS  . calcitRIOL  0.25 mcg Oral Daily  . carvedilol  25 mg Oral BID  . chlorhexidine gluconate  15 mL Mouth Rinse BID  . furosemide  60 mg Intravenous Once  . heparin  5,000 Units Subcutaneous 3 times per day  . insulin aspart  0-9 Units Subcutaneous 6 times per day  . LORazepam  0.5 mg Oral BID  . mirtazapine  7.5 mg Oral QHS  . piperacillin-tazobactam (ZOSYN)  IV  2.25 g Intravenous 3 times per day  . saccharomyces boulardii  250 mg Oral BID  . simvastatin  20 mg Oral q1800  . sodium chloride  3 mL Intravenous Q12H   Continuous Infusions:  PRN Meds:.sodium chloride, acetaminophen **OR** acetaminophen, fentaNYL (SUBLIMAZE) injection, levalbuterol, ondansetron (ZOFRAN) IV    Assessment/Plan: 81 y.o. female with complicated GB disease, s/p open chole yesterday  Doing well. T bili slightly elevated but really same as pre-op.  Given ERCPs, drain check in past 2-3 months I have low suspicion for retained stone.  If there becomes concern for this as she recovers, please call. I am covering [facility] this weekend.  Otherwise will sign off for now.    Milus Banister, MD  10/27/2014, 8:44 AM [facility] Pager [phone]

## 2014-10-28 ENCOUNTER — Inpatient Hospital Stay (HOSPITAL_COMMUNITY): Payer: Medicare Other

## 2014-10-28 DIAGNOSIS — N184 Chronic kidney disease, stage 4 (severe): Secondary | ICD-10-CM

## 2014-10-28 DIAGNOSIS — I482 Chronic atrial fibrillation: Secondary | ICD-10-CM

## 2014-10-28 DIAGNOSIS — N179 Acute kidney failure, unspecified: Secondary | ICD-10-CM

## 2014-10-28 DIAGNOSIS — J90 Pleural effusion, not elsewhere classified: Secondary | ICD-10-CM

## 2014-10-28 LAB — BASIC METABOLIC PANEL
Anion gap: 12 (ref 5–15)
BUN: 73 mg/dL — ABNORMAL HIGH (ref 6–20)
CO2: 23 mmol/L (ref 22–32)
Calcium: 8.3 mg/dL — ABNORMAL LOW (ref 8.9–10.3)
Chloride: 104 mmol/L (ref 101–111)
Creatinine, Ser: 4.22 mg/dL — ABNORMAL HIGH (ref 0.44–1.00)
GFR calc Af Amer: 10 mL/min — ABNORMAL LOW (ref >60)
GFR calc non Af Amer: 9 mL/min — ABNORMAL LOW (ref >60)
Glucose, Bld: 111 mg/dL — ABNORMAL HIGH (ref 65–99)
Potassium: 4.3 mmol/L (ref 3.5–5.1)
Sodium: 139 mmol/L (ref 135–145)

## 2014-10-28 LAB — GLUCOSE, CAPILLARY
GLUCOSE-CAPILLARY: 124 mg/dL — AB (ref 65–99)
GLUCOSE-CAPILLARY: 168 mg/dL — AB (ref 65–99)
Glucose-Capillary: 104 mg/dL — ABNORMAL HIGH (ref 65–99)
Glucose-Capillary: 113 mg/dL — ABNORMAL HIGH (ref 65–99)
Glucose-Capillary: 148 mg/dL — ABNORMAL HIGH (ref 65–99)
Glucose-Capillary: 167 mg/dL — ABNORMAL HIGH (ref 65–99)

## 2014-10-28 NOTE — Progress Notes (Signed)
Bladder scan done earlier this afternoon, 63mL recorded. PO fluids encouraged.

## 2014-10-28 NOTE — Evaluation (Signed)
Physical Therapy Evaluation Patient Details Name: [name] MRN: [id number] DOB: [birth year] Today's Date: 10/28/2014   History of Present Illness  81 y.o. female admitted to [facility] on 10/24/14 for nausea and RUQ pain.  Dx with acute cholecystitis and underwent cholecystectomy on 10/26/14.  Pt with significant PMHx of CKD IV, anemia, chronic A-fib, chronic diastolic CHF, HTN, severe aortic stenosis, depression/anxiety, recurrent pleural effusion s/p plurex catheter placement R, cholecystitis, DM, colectomy with L side ostomy due to CA, and R leg fx surgery.  Clinical Impression  Pt is generally weak and deconditioned post-operatively. She is shaky on her feet and has limited gait distance at this time. She lives with a daughter who she states cannot physically help her due to daughter's back issues.  I do not believe it is safe to send this lady home.  She will need post acute rehab to safely return home with only her daughter's supervision.   PT to follow acutely for deficits listed below.       Follow Up Recommendations SNF    Equipment Recommendations  None recommended by PT    Recommendations for Other Services   NA    Precautions / Restrictions Precautions Precautions: Fall Precaution Comments: pt is very weak and shaky on her feet      Mobility  Bed Mobility Overal bed mobility: Needs Assistance Bed Mobility: Rolling;Sidelying to Sit Rolling: Min assist Sidelying to sit: Mod assist       General bed mobility comments: Min assist to help reach for railing while rolling to EOB, mod assist to support trunk during transition to sit.  Pt painful during these transitions.  Verbal cues for rolling to help reduce pain and protect incision.   Transfers Overall transfer level: Needs assistance Equipment used: Rolling walker (2 wheeled) Transfers: Sit to/from Stand Sit to Stand: Min assist         General transfer comment: Min assist to support trunk during  transition  Ambulation/Gait Ambulation/Gait assistance: Min assist;Mod assist Ambulation Distance (Feet): 20 Feet Assistive device: Rolling walker (2 wheeled) Gait Pattern/deviations: Step-through pattern;Shuffle;Trunk flexed Gait velocity: decreased Gait velocity interpretation: Below normal speed for age/gender General Gait Details: min up to mod assist during gait to support trunk while ambulating over weak and shaky legs.  Pt needs max verbal and manual assist to stay closer to RW.  Abnormal gait pattern is her baseline since her right hip fracture surgery.  Pt reports she is weaker than usual.          Balance Overall balance assessment: Needs assistance Sitting-balance support: Feet supported;Bilateral upper extremity supported Sitting balance-Leahy Scale: Fair     Standing balance support: Bilateral upper extremity supported Standing balance-Leahy Scale: Poor                               Pertinent Vitals/Pain Pain Assessment: Faces Faces Pain Scale: Hurts even more Pain Location: abdomen, incisional while mobilizing to EOB.  Pain Descriptors / Indicators: Aching;Burning;Grimacing Pain Intervention(s): Limited activity within patient's tolerance;Monitored during session;Repositioned    Home Living Family/patient expects to be discharged to:: Private residence Living Arrangements: Children (daughter 24/7 supervision only) Available Help at Discharge: Family;Available 24 hours/day (supervision only, daughter with bad back needs back surgery) Type of Home: Mobile home Home Access: Stairs to enter Entrance Stairs-Rails: Right;Left;Can reach both Entrance Stairs-Number of Steps: 7 Home Layout: One level Home Equipment: Walker - 2 wheels;Walker - 4 wheels;Bedside commode;Shower seat;Cane -  single point Additional Comments: Pt reports daughter cannot physically help due to daughter's own back issues.     Prior Function Level of Independence: Independent with  assistive device(s)         Comments: Uses rollator.      Hand Dominance   Dominant Hand: Right    Extremity/Trunk Assessment   Upper Extremity Assessment: Generalized weakness           Lower Extremity Assessment: Generalized weakness      Cervical / Trunk Assessment: Normal  Communication   Communication: No difficulties  Cognition Arousal/Alertness: Awake/alert Behavior During Therapy: Flat affect Overall Cognitive Status: Within Functional Limits for tasks assessed                               Assessment/Plan    PT Assessment Patient needs continued PT services  PT Diagnosis Difficulty walking;Abnormality of gait;Generalized weakness;Acute pain   PT Problem List Decreased strength;Decreased activity tolerance;Decreased balance;Decreased mobility;Decreased knowledge of use of DME;Pain  PT Treatment Interventions DME instruction;Gait training;Stair training;Functional mobility training;Therapeutic activities;Therapeutic exercise;Balance training;Neuromuscular re-education;Patient/family education   PT Goals (Current goals can be found in the Care Plan section) Acute Rehab PT Goals Patient Stated Goal: to go home ASAP PT Goal Formulation: With patient Time For Goal Achievement: 11/11/14 Potential to Achieve Goals: Good    Frequency Min 3X/week   Barriers to discharge Decreased caregiver support pt reports her daughter has back problems and cannot physically help her       End of Session   Activity Tolerance: Patient limited by pain;Patient limited by fatigue Patient left: in chair;with call bell/phone within reach Nurse Communication: Mobility status         Time: 5631-4970 PT Time Calculation (min) (ACUTE ONLY): 19 min   Charges:   PT Evaluation $Initial PT Evaluation Tier I: 1 Procedure          Azael Ragain B. Rossiter, [place], DPT [phone]   10/28/2014, 4:14 PM

## 2014-10-28 NOTE — Progress Notes (Signed)
PROGRESS NOTE  [name] XIP:[id number] DOB: 1925/07/23 DOA: 10/24/2014 PCP: Abigail Miyamoto, MD  Brief history 81 year old female with history of stage D severe symptomatic aortic stenosis, stage IV CKD with nonfunctioning AV fistula, chronic right pleural effusion with an indwelling drainage catheter, IDDM/type II DM, chronic A. fib not an anticoagulation candidate because of fall risk, debilitation/weakness, chronic diastolic CHF, remote colon cancer history status post colostomy. The patient has a calculus cholecystitis status post percutaneous cholecystotomy tube February 2016, and was admitted on 10/24/2014 with recurrent cholecystitis.  The patient underwent open cholecystectomy on 8/4 per Dr. Donne Hazel and was returned to ICU for observation. After 24 hours observation, the patient was transferred to the medical floor. Assessment/Plan: Acute cholecystitis.  -H/o cholecystitis, choledocholithiasis with recent Biliary drainage perc cholecystostomy tube (04/2014) and ERCP (06/2014) -CT abdomen: Enlarged gallbladder contains small calcified stones with surrounding inflammation suggestive of acute cholecystitis -Open cholecystectomy 10/26/14 -Appreciate cardiology input  -diet per general surgery -PT evaluation  CHF recurrent pleural effusion s/p Pleurex, Severe Aortic Stenosis. Echo (08/2014). LVEF: 65-70%. Severe aortic stenosis with Mean gradient (S): 34 mm Hg. -Does not appear to be overtly decompensated -We'll need more accurate I's and O's -Daily weights -Patient was given furosemide 60 mg IV 1 10/26/2014 -Decision to resume furosemide per cardiology -Patient normally has right-sided Pleurx drained once per week on Tues.  Acute on chronic renal failure Stage IV chronic kidney disease - Baseline creatinine 2.3-2.6 -Secondary to hemodynamic derangements in setting of recent acute cholecystitis -Creatinine appears to be plateauing -If continued worsening, consult  nephrology  Type II DM/IDDM - Held oral hypoglycemics. Continue SSI. - Controlled. -09/22/2014 hemoglobin A1c 6.5  Severe symptomatic aortic stenosis - Cardiology is coordinating care with multiple specialists. Patient and family considering TAVR  Chronic/recurrent right pleural effusion, s/p Pleurx catheter placement - Continue to drain Pleurx catheter every 2 days and outpatient follow-up with thoracic surgery. Improved clinically and radiologically. - As per nursing, Pleurx catheter was drained on 7/2 for 350 mL fluid. Requested nursing to drain pleural fluid 7/4.  Chronic A. Fib -Rate controlled -Continue carvedilol  -not AC candidate due to falls -CHADS-VASc= 5 -restart ASA 81 mg daily  Anemia - Hemoglobin has slightly dropped. Stable  Leukocytosis -Remains afebrile and hemodynamically stable -Discontinue antibiotics -Monitor closely for now -No significant pyuria to suggest UTI  Mild thrombocytopenia - Probably due to acute infection. Monitor   Family Communication:   Pt at beside Disposition Plan:   Home when medically stable    Procedures/Studies: Ct Abdomen Pelvis Wo Contrast  10/24/2014   CLINICAL DATA:  81 year old female with right flank/right upper quadrant pain for 2 days. Nausea and vomiting. Drain in place to drain fluid from lungs and abdomen. Subsequent encounter.  EXAM: CT ABDOMEN AND PELVIS WITHOUT CONTRAST  TECHNIQUE: Multidetector CT imaging of the abdomen and pelvis was performed following the standard protocol without IV contrast.  COMPARISON:  10/24/2014 plain film exam.  09/22/2014 CT.  FINDINGS: Inferior right chest tube is in place with decrease in size but incomplete clearance of right-sided hydro pneumothorax.  Very small left-sided pleural effusion with basilar atelectasis.  Enlarged gallbladder contains small calcified stones with surrounding inflammation suggestive of acute cholecystitis.  Pneumobilia once again noted in the common bile duct  which may be related to prior sphincterotomy. No calcified common bile duct stone visualized.  Post colectomy with surgical clips surrounding the cecum/right colon and colostomy left lower abdomen  with large parastomal hernia containing small and large bowel. Previously stomach partially entered the hernia but has been reduced. No free intraperitoneal air.  Cardiomegaly. Mitral valve and aortic valve calcifications. Coronary artery calcifications.  Atherosclerotic type changes aorta with mild ectasia. Atherosclerotic type changes iliac arteries.  Right kidney rotated anteriorly. No renal or ureteral obstructing stone. Left upper pole 1.2 cm cyst. Calcification left hilum may be vascular in origin.  Taking into account limitation by non contrast imaging, no worrisome hepatic, splenic, pancreatic or adrenal lesion.  Post hysterectomy.  No urinary bladder abnormality noted.  Heterogeneous bone marrow as previously noted. Cannot exclude metastatic disease myeloma. Schmorl's node deformities. Spinal stenosis most notable L4-5.  Scattered normal to top-normal size lymph nodes.  IMPRESSION: Enlarged gallbladder contains small calcified stones with surrounding inflammation suggestive of acute cholecystitis.  Pneumobilia once again noted in the common bile duct which may be related to prior sphincterotomy. No calcified common bile duct stone visualized.  Colostomy left lower abdomen with large parastomal hernia containing small and large bowel.  Inferior right chest tube is in place with decrease in size but incomplete clearance of right-sided hydro pneumothorax.  Very small left-sided pleural effusion with basilar atelectasis.  Cardiomegaly. Mitral valve and aortic valve calcifications. Coronary artery calcifications.  Atherosclerotic type changes aorta, aortic branch vessels and iliac arteries.  Heterogeneous bone marrow as previously noted. Cannot exclude metastatic disease or myeloma. Schmorl's node deformities. Spinal  stenosis most notable L4-5.  These results were called by telephone at the time of interpretation on 10/24/2014 at 7:10 am to Dr. Mingo Amber , who verbally acknowledged these results.   Electronically Signed   By: Genia Del M.D.   On: 10/24/2014 07:22   Dg Chest 2 View  10/03/2014   CLINICAL DATA:  Right pleural drainage catheter inserted on September 12, 2014 4 multiloculated hydro pneumothorax ; experiencing some shortness of breath today  EXAM: CHEST  2 VIEW  COMPARISON:  Portable chest x-ray of September 27, 2014 and PA and lateral chest x-ray of September 12, 2014.  FINDINGS: There has been marked improvement in the appearance of the pulmonary interstitium bilaterally. The cardiac silhouette remains enlarged. The pulmonary vascularity is less engorged than on the previous study. On the right there are at least 2 small air-fluid levels anteriorly in the mid hemi thorax. There is an air-fluid level at the right lung base posteriorly. The small caliber chest tube has its tip projecting over the posterior aspect of the right fourth rib. The bony thorax exhibits no acute abnormality.  IMPRESSION: There has been considerable interval improvement in the appearance of the chest since the previous study, but there remain loculated fluid collections in the anterior aspect of the mid right hemithorax and posteriorly in the inferior aspect of the thorax. The chest tube tip lies superiorly in the upper right pleural space posteriorly.   Electronically Signed   By: Yarnell Arvidson  [place] M.D.   On: 10/03/2014 12:37   Dg Chest Port 1 View  10/28/2014   CLINICAL DATA:  81 year old female with pleural effusion.  EXAM: PORTABLE CHEST - 1 VIEW  COMPARISON:  Chest x-ray 10/27/2014.  FINDINGS: Lung volumes are low. Extensive bibasilar opacities (right greater than left) may reflect areas of atelectasis and/or consolidation. Small to moderate left and moderate right-sided pleural effusions. Previously noted pleural fluid in the apex of the right  hemithorax has redistributed. Small bore right-sided chest tube again noted, tip of which appears to extend into the apex of the  right hemithorax. No cephalization of the pulmonary vasculature. Heart size appears mildly enlarged. The patient is rotated to the left on today's exam, resulting in distortion of the mediastinal contours and reduced diagnostic sensitivity and specificity for mediastinal pathology. Atherosclerosis in the thoracic aorta.  IMPRESSION: 1. Decreased size and read distribution of moderate right pleural effusion. 2. Persistent low lung volumes with bibasilar areas of atelectasis and/or consolidation. 3. Mild cardiomegaly. 4. Atherosclerosis.   Electronically Signed   By: Vinnie Langton M.D.   On: 10/28/2014 08:55   Dg Chest Port 1 View  10/27/2014   CLINICAL DATA:  Pleural effusion.  EXAM: PORTABLE CHEST - 1 VIEW  COMPARISON:  10/24/2014 .  CT 08/30/2014.  FINDINGS: Right chest tube in stable position . No pneumothorax on today's exam. Cardiomegaly with bilateral pulmonary interstitial prominence and bilateral pleural effusions, right side greater than left noted. Right apical pleural thickening noted consistent with pleural effusion.  IMPRESSION: 1. Right chest tube in stable position. No pneumothorax noted on today's exam. 2. Congestive heart failure with bilateral pulmonary interstitial edema and bilateral pleural effusion. Right pleural effusion is prominent.   Electronically Signed   By: Marcello Moores  Register   On: 10/27/2014 07:39   Dg Abd Acute W/chest  10/24/2014   CLINICAL DATA:  RIGHT lower quadrant pain beginning 2 days ago, nausea and vomiting. Pleural effusion.  EXAM: DG ABDOMEN ACUTE W/ 1V CHEST  COMPARISON:  Chest radiograph October 03, 2014  FINDINGS: RIGHT pleural fat extending to the lung apex, with decrease, smaller loose decidual pleural effusion. Cardiac silhouette is moderately enlarged unchanged. Calcified aortic knob. Similar chronic moderate bronchitic changes without  focal consolidation. No pneumothorax. Soft tissue planes included osseous structure nonsuspicious, mild degenerative change of the thoracic spine.  Paucity of bowel gas. Surgical clips along LEFT abdomen associated with apparent large hernia, the lateral aspect not fully imaged. The hernia sac appears to contain multiple loops of bowel. No intra-abdominal mass effect or pathologic calcifications. No intraperitoneal free air on the decubitus view. Moderate degenerative change of the lumbar spine.  IMPRESSION: RIGHT chest tube, small residual RIGHT pleural effusion, improved.  Stable cardiomegaly and moderate chronic bronchitic changes.  Large suspected LEFT lateral abdominal wall hernia containing bowel without bowel obstruction.   Electronically Signed   By: Elon Alas M.D.   On: 10/24/2014 05:16         Subjective: She complains of some shortness of breath but this is not a worse unusual. Denies any fevers, chills, chest discomfort, vomiting, diarrhea, dysuria. She has abdominal pain at the incisional site. Denies any headache or visual disturbance.  Objective: Filed Vitals:   10/27/14 1542 10/27/14 2105 10/27/14 2236 10/28/14 0508  BP: 94/42 105/44 127/60 113/51  Pulse: 68 67 67 68  Temp: 97.6 F (36.4 C) 98 F (36.7 C)  97.6 F (36.4 C)  TempSrc: Oral Oral  Oral  Resp: 20 20  18   Height:      Weight:      SpO2: 87% 92%  99%    Intake/Output Summary (Last 24 hours) at 10/28/14 0953 Last data filed at 10/28/14 0935  Gross per 24 hour  Intake    463 ml  Output    225 ml  Net    238 ml   Weight change:  Exam:   General:  Pt is alert, follows commands appropriately, not in acute distress  HEENT: No icterus, No thrush, No neck mass, [place]/AT; no meningismus  Cardiovascular: RRR, S1/S2, no rubs,  no gallops  Respiratory: Bibasilar crackles, right greater than left. No wheezing.  Abdomen: Soft/+BS, periumbilical tender, non distended, no guarding; no  hepatosplenomegaly  Extremities: No edema, No lymphangitis, No petechiae, No rashes, no synovitis  Data Reviewed: Basic Metabolic Panel:  Recent Labs Lab 10/24/14 0500 10/25/14 0445 10/26/14 0350 10/27/14 0220 10/28/14 0454  NA 135 140 138 140 139  K 3.8 3.8 4.8 4.3 4.3  CL 97* 102 100* 102 104  CO2 27 29 26  21* 23  GLUCOSE 207* 87 84 152* 111*  BUN 54* 54* 58* 63* 73*  CREATININE 3.03* 3.30* 3.85* 4.06* 4.22*  CALCIUM 8.6* 8.2* 8.2* 8.3* 8.3*  PHOS  --   --   --  7.9*  --    Liver Function Tests:  Recent Labs Lab 10/24/14 0500 10/26/14 0350 10/27/14 0220  AST 17 17 19   ALT 8* 6* 8*  ALKPHOS 117 82 79  BILITOT 0.8 1.3* 1.4*  PROT 6.0* 5.5* 5.5*  ALBUMIN 2.7* 2.1* 2.0*    Recent Labs Lab 10/24/14 0500  LIPASE 58*   No results for input(s): AMMONIA in the last 168 hours. CBC:  Recent Labs Lab 10/24/14 0500 10/25/14 0445 10/26/14 0350 10/27/14 0500  WBC 11.8* 9.9 9.7 15.3*  NEUTROABS 9.7*  --   --   --   HGB 11.8* 10.2* 10.2* 10.4*  HCT 38.7 34.4* 34.2* 34.1*  MCV 94.6 97.5 95.8 95.5  PLT 141* 138* 141* 146*   Cardiac Enzymes: No results for input(s): CKTOTAL, CKMB, CKMBINDEX, TROPONINI in the last 168 hours. BNP: Invalid input(s): POCBNP CBG:  Recent Labs Lab 10/27/14 1713 10/27/14 2001 10/28/14 0015 10/28/14 0410 10/28/14 0739  GLUCAP 144* 131* 113* 124* 104*    Recent Results (from the past 240 hour(s))  Urine culture     Status: None   Collection Time: 10/24/14  6:03 AM  Result Value Ref Range Status   Specimen Description URINE, RANDOM  Final   Special Requests ADDED [id number] [phone]  Final   Culture >=100,000 COLONIES/mL ESCHERICHIA COLI  Final   Report Status 10/26/2014 FINAL  Final   Organism ID, Bacteria ESCHERICHIA COLI  Final      Susceptibility   Escherichia coli - MIC*    AMPICILLIN <=2 SENSITIVE Sensitive     CEFAZOLIN <=4 SENSITIVE Sensitive     CEFTRIAXONE <=1 SENSITIVE Sensitive     CIPROFLOXACIN <=0.25 SENSITIVE  Sensitive     GENTAMICIN <=1 SENSITIVE Sensitive     IMIPENEM <=0.25 SENSITIVE Sensitive     NITROFURANTOIN <=16 SENSITIVE Sensitive     TRIMETH/SULFA <=20 SENSITIVE Sensitive     AMPICILLIN/SULBACTAM <=2 SENSITIVE Sensitive     PIP/TAZO <=4 SENSITIVE Sensitive     * >=100,000 COLONIES/mL ESCHERICHIA COLI  Surgical pcr screen     Status: None   Collection Time: 10/26/14  5:33 AM  Result Value Ref Range Status   MRSA, PCR NEGATIVE NEGATIVE Final   Staphylococcus aureus NEGATIVE NEGATIVE Final    Comment:        The Xpert SA Assay (FDA approved for NASAL specimens in patients over 28 years of age), is one component of a comprehensive surveillance program.  Test performance has been validated by [facility] for patients greater than or equal to 8 year old. It is not intended to diagnose infection nor to guide or monitor treatment.      Scheduled Meds: . aspirin EC  81 mg Oral QHS  . calcitRIOL  0.25 mcg Oral Daily  .  carvedilol  25 mg Oral BID  . cefTRIAXone (ROCEPHIN)  IV  1 g Intravenous Q24H  . heparin  5,000 Units Subcutaneous 3 times per day  . insulin aspart  0-9 Units Subcutaneous 6 times per day  . LORazepam  0.5 mg Oral BID  . mirtazapine  7.5 mg Oral QHS  . saccharomyces boulardii  250 mg Oral BID  . simvastatin  20 mg Oral q1800  . sodium chloride  3 mL Intravenous Q12H   Continuous Infusions:    Payten Hobin, DO  [facility] Pager [phone]  If 7PM-7AM, please contact night-coverage [email or web address] Password [email or web address] 10/28/2014, 9:53 AM   LOS: 4 days

## 2014-10-28 NOTE — Progress Notes (Signed)
GS Progress Note Subjective: Minimal discomfort with movement, tolerating clears well.  Objective: Vital signs in last 24 hours: Temp:  [97.6 F (36.4 C)-98 F (36.7 C)] 97.6 F (36.4 C) (08/06 0508) Pulse Rate:  [62-72] 68 (08/06 0508) Resp:  [16-20] 18 (08/06 0508) BP: (94-127)/(42-60) 113/51 mmHg (08/06 0508) SpO2:  [87 %-99 %] 99 % (08/06 0508) Last BM Date: 10/26/14  Intake/Output from previous day: 08/05 0701 - 08/06 0700 In: 343 [P.O.:340; I.V.:3] Out: 100 [Urine:100] Intake/Output this shift: Total I/O In: 120 [P.O.:120] Out: 125 [Urine:125]  Lungs: CTAB  Abd: soft, appropriately tender, dressing in place no seep through  Extremities: no edema  Neuro: GCS 15 AOx4  Lab Results: CBC   Recent Labs  10/26/14 0350 10/27/14 0500  WBC 9.7 15.3*  HGB 10.2* 10.4*  HCT 34.2* 34.1*  PLT 141* 146*   BMET  Recent Labs  10/27/14 0220 10/28/14 0454  NA 140 139  K 4.3 4.3  CL 102 104  CO2 21* 23  GLUCOSE 152* 111*  BUN 63* 73*  CREATININE 4.06* 4.22*  CALCIUM 8.3* 8.3*   PT/INR No results for input(s): LABPROT, INR in the last 72 hours. ABG No results for input(s): PHART, HCO3 in the last 72 hours.  Invalid input(s): PCO2, PO2  Studies/Results: Dg Chest Port 1 View  10/28/2014   CLINICAL DATA:  81 year old female with pleural effusion.  EXAM: PORTABLE CHEST - 1 VIEW  COMPARISON:  Chest x-ray 10/27/2014.  FINDINGS: Lung volumes are low. Extensive bibasilar opacities (right greater than left) may reflect areas of atelectasis and/or consolidation. Small to moderate left and moderate right-sided pleural effusions. Previously noted pleural fluid in the apex of the right hemithorax has redistributed. Small bore right-sided chest tube again noted, tip of which appears to extend into the apex of the right hemithorax. No cephalization of the pulmonary vasculature. Heart size appears mildly enlarged. The patient is rotated to the left on today's exam, resulting in  distortion of the mediastinal contours and reduced diagnostic sensitivity and specificity for mediastinal pathology. Atherosclerosis in the thoracic aorta.  IMPRESSION: 1. Decreased size and read distribution of moderate right pleural effusion. 2. Persistent low lung volumes with bibasilar areas of atelectasis and/or consolidation. 3. Mild cardiomegaly. 4. Atherosclerosis.   Electronically Signed   By: Vinnie Langton M.D.   On: 10/28/2014 08:55   Dg Chest Port 1 View  10/27/2014   CLINICAL DATA:  Pleural effusion.  EXAM: PORTABLE CHEST - 1 VIEW  COMPARISON:  10/24/2014 .  CT 08/30/2014.  FINDINGS: Right chest tube in stable position . No pneumothorax on today's exam. Cardiomegaly with bilateral pulmonary interstitial prominence and bilateral pleural effusions, right side greater than left noted. Right apical pleural thickening noted consistent with pleural effusion.  IMPRESSION: 1. Right chest tube in stable position. No pneumothorax noted on today's exam. 2. Congestive heart failure with bilateral pulmonary interstitial edema and bilateral pleural effusion. Right pleural effusion is prominent.   Electronically Signed   By: Marcello Moores  Register   On: 10/27/2014 07:39    Anti-infectives: Anti-infectives    Start     Dose/Rate Route Frequency Ordered Stop   10/27/14 1100  cefTRIAXone (ROCEPHIN) 1 g in dextrose 5 % 50 mL IVPB  Status:  Discontinued     1 g 100 mL/hr over 30 Minutes Intravenous Every 24 hours 10/27/14 1012 10/28/14 1004   10/24/14 1100  piperacillin-tazobactam (ZOSYN) IVPB 2.25 g  Status:  Discontinued     2.25 g 100 mL/hr  over 30 Minutes Intravenous 3 times per day 10/24/14 1035 10/27/14 1012   10/24/14 0730  piperacillin-tazobactam (ZOSYN) IVPB 3.375 g     3.375 g 100 mL/hr over 30 Minutes Intravenous  Once 10/24/14 0718 10/24/14 0832      Assessment/Plan: s/p Procedure(s): OPEN CHOLECYSTECTOMY Advance diet Decrease IVF  LOS: 4 days    [employer]

## 2014-10-28 NOTE — Progress Notes (Signed)
Patient Name: [name] Date of Encounter: 10/28/2014  Active Problems:   Acute cholecystitis   Choledocholithiasis with acute cholecystitis   Aortic stenosis, severe   Preoperative cardiovascular examination   Cholecystitis   Length of Stay: 4  SUBJECTIVE  Denies dyspnea. Tolerating broth and other liquids. Abdominal pain only when she moves. In and Out not accurately recorded.  CURRENT MEDS . aspirin EC  81 mg Oral QHS  . calcitRIOL  0.25 mcg Oral Daily  . carvedilol  25 mg Oral BID  . cefTRIAXone (ROCEPHIN)  IV  1 g Intravenous Q24H  . heparin  5,000 Units Subcutaneous 3 times per day  . insulin aspart  0-9 Units Subcutaneous 6 times per day  . LORazepam  0.5 mg Oral BID  . mirtazapine  7.5 mg Oral QHS  . saccharomyces boulardii  250 mg Oral BID  . simvastatin  20 mg Oral q1800  . sodium chloride  3 mL Intravenous Q12H    OBJECTIVE   Intake/Output Summary (Last 24 hours) at 10/28/14 0851 Last data filed at 10/27/14 2233  Gross per 24 hour  Intake    343 ml  Output    100 ml  Net    243 ml   Filed Weights   10/26/14 1345  Weight: 178 lb 12.7 oz (81.1 kg)    PHYSICAL EXAM Filed Vitals:   10/27/14 1542 10/27/14 2105 10/27/14 2236 10/28/14 0508  BP: 94/42 105/44 127/60 113/51  Pulse: 68 67 67 68  Temp: 97.6 F (36.4 C) 98 F (36.7 C)  97.6 F (36.4 C)  TempSrc: Oral Oral  Oral  Resp: 20 20  18   Height:      Weight:      SpO2: 87% 92%  99%   General: Alert, oriented x3, no distress Head: no evidence of trauma, PERRL, EOMI, no exophtalmos or lid lag, no myxedema, no xanthelasma; normal ears, nose and oropharynx Neck: normal jugular venous pulsations and no hepatojugular reflux; weak carotid pulses with delay and bilateral carotid bruits Chest: clear to auscultation, no signs of consolidation by percussion or palpation, normal fremitus, symmetrical and full respiratory excursions Cardiovascular: normal position and quality of the apical impulse,  irregular rhythm, normal first and second heart sounds, no rubs or gallops, 2-3 mid peaking aortic ejection murmur murmur Abdomen: no tenderness or distention, no masses by palpation, no abnormal pulsatility or arterial bruits, normal bowel sounds, no hepatosplenomegaly Extremities: no clubbing, cyanosis or edema; 2+ radial, ulnar and brachial pulses bilaterally; 2+ right femoral, posterior tibial and dorsalis pedis pulses; 2+ left femoral, posterior tibial and dorsalis pedis pulses; no subclavian or femoral bruits Neurological: grossly nonfocal  LABS  CBC  Recent Labs  10/26/14 0350 10/27/14 0500  WBC 9.7 15.3*  HGB 10.2* 10.4*  HCT 34.2* 34.1*  MCV 95.8 95.5  PLT 141* 967*   Basic Metabolic Panel  Recent Labs  10/27/14 0220 10/28/14 0454  NA 140 139  K 4.3 4.3  CL 102 104  CO2 21* 23  GLUCOSE 152* 111*  BUN 63* 73*  CREATININE 4.06* 4.22*  CALCIUM 8.3* 8.3*  PHOS 7.9*  --    Liver Function Tests  Recent Labs  10/26/14 0350 10/27/14 0220  AST 17 19  ALT 6* 8*  ALKPHOS 82 79  BILITOT 1.3* 1.4*  PROT 5.5* 5.5*  ALBUMIN 2.1* 2.0*    Radiology Studies Imaging results have been reviewed and Dg Chest Port 1 View  10/27/2014   CLINICAL DATA:  Pleural  effusion.  EXAM: PORTABLE CHEST - 1 VIEW  COMPARISON:  10/24/2014 .  CT 08/30/2014.  FINDINGS: Right chest tube in stable position . No pneumothorax on today's exam. Cardiomegaly with bilateral pulmonary interstitial prominence and bilateral pleural effusions, right side greater than left noted. Right apical pleural thickening noted consistent with pleural effusion.  IMPRESSION: 1. Right chest tube in stable position. No pneumothorax noted on today's exam. 2. Congestive heart failure with bilateral pulmonary interstitial edema and bilateral pleural effusion. Right pleural effusion is prominent.   Electronically Signed   By: Marcello Moores  Register   On: 10/27/2014 07:39    TELE atrial fibrillation, rate controlled  ECG atrial  fibrillation, RAD, LPFB, no major repol changes  ASSESSMENT AND PLAN  Appears grossly euvolemic. It is very important to keep very accurate records of intake and output - discussed with nursing staff. Creatinine continue to worsen, but pace of deterioration has slowed.  Stay off furosemide. She is likely to have a very narrow margin of compensation between hypotension/renal failure on the one hand and acute CHF on the other. Avoid big swings in fluid management. Resume furosemide once worsening renal function trend reverses.   Sanda Klein, MD, [facility] [phone] office [phone] pager 10/28/2014 8:51 AM

## 2014-10-29 DIAGNOSIS — N184 Chronic kidney disease, stage 4 (severe): Secondary | ICD-10-CM

## 2014-10-29 DIAGNOSIS — N179 Acute kidney failure, unspecified: Secondary | ICD-10-CM

## 2014-10-29 LAB — BASIC METABOLIC PANEL
Anion gap: 12 (ref 5–15)
BUN: 74 mg/dL — ABNORMAL HIGH (ref 6–20)
CALCIUM: 8.1 mg/dL — AB (ref 8.9–10.3)
CO2: 23 mmol/L (ref 22–32)
Chloride: 98 mmol/L — ABNORMAL LOW (ref 101–111)
Creatinine, Ser: 4.51 mg/dL — ABNORMAL HIGH (ref 0.44–1.00)
GFR calc non Af Amer: 8 mL/min — ABNORMAL LOW (ref >60)
GFR, EST AFRICAN AMERICAN: 9 mL/min — AB (ref >60)
GLUCOSE: 106 mg/dL — AB (ref 65–99)
POTASSIUM: 4.2 mmol/L (ref 3.5–5.1)
Sodium: 133 mmol/L — ABNORMAL LOW (ref 135–145)

## 2014-10-29 LAB — CBC
HEMATOCRIT: 30.3 % — AB (ref 36.0–46.0)
Hemoglobin: 9.1 g/dL — ABNORMAL LOW (ref 12.0–15.0)
MCH: 28.2 pg (ref 26.0–34.0)
MCHC: 30 g/dL (ref 30.0–36.0)
MCV: 93.8 fL (ref 78.0–100.0)
Platelets: 137 10*3/uL — ABNORMAL LOW (ref 150–400)
RBC: 3.23 MIL/uL — ABNORMAL LOW (ref 3.87–5.11)
RDW: 16.1 % — AB (ref 11.5–15.5)
WBC: 9.3 10*3/uL (ref 4.0–10.5)

## 2014-10-29 LAB — GLUCOSE, CAPILLARY
GLUCOSE-CAPILLARY: 101 mg/dL — AB (ref 65–99)
Glucose-Capillary: 114 mg/dL — ABNORMAL HIGH (ref 65–99)
Glucose-Capillary: 119 mg/dL — ABNORMAL HIGH (ref 65–99)
Glucose-Capillary: 147 mg/dL — ABNORMAL HIGH (ref 65–99)
Glucose-Capillary: 167 mg/dL — ABNORMAL HIGH (ref 65–99)
Glucose-Capillary: 167 mg/dL — ABNORMAL HIGH (ref 65–99)

## 2014-10-29 MED ORDER — SODIUM CHLORIDE 0.9 % IV SOLN
INTRAVENOUS | Status: AC
  Administered 2014-10-29: 13:00:00 via INTRAVENOUS

## 2014-10-29 MED ORDER — CARVEDILOL 6.25 MG PO TABS
6.2500 mg | ORAL_TABLET | Freq: Two times a day (BID) | ORAL | Status: DC
  Administered 2014-10-30 – 2014-11-01 (×5): 6.25 mg via ORAL
  Filled 2014-10-29 (×6): qty 1

## 2014-10-29 MED ORDER — INSULIN ASPART 100 UNIT/ML ~~LOC~~ SOLN
0.0000 [IU] | Freq: Three times a day (TID) | SUBCUTANEOUS | Status: DC
  Administered 2014-10-29 – 2014-10-30 (×4): 2 [IU] via SUBCUTANEOUS
  Administered 2014-10-31: 1 [IU] via SUBCUTANEOUS
  Administered 2014-10-31: 2 [IU] via SUBCUTANEOUS
  Administered 2014-11-01: 3 [IU] via SUBCUTANEOUS

## 2014-10-29 NOTE — Progress Notes (Signed)
Patient Name: [name] Date of Encounter: 10/29/2014  Active Problems:   Type II diabetes mellitus with complication   Acute cholecystitis   Choledocholithiasis with acute cholecystitis   Aortic stenosis, severe   Preoperative cardiovascular examination   Cholecystitis   Acute renal failure superimposed on stage 4 chronic kidney disease   Length of Stay: 5  SUBJECTIVE  Denies dyspnea. Abdominal pain only when she moves. In and Out not accurately recorded, again.  CURRENT MEDS . aspirin EC  81 mg Oral QHS  . calcitRIOL  0.25 mcg Oral Daily  . carvedilol  25 mg Oral BID  . heparin  5,000 Units Subcutaneous 3 times per day  . insulin aspart  0-9 Units Subcutaneous TID WC  . LORazepam  0.5 mg Oral BID  . mirtazapine  7.5 mg Oral QHS  . saccharomyces boulardii  250 mg Oral BID  . simvastatin  20 mg Oral q1800  . sodium chloride  3 mL Intravenous Q12H    OBJECTIVE   Intake/Output Summary (Last 24 hours) at 10/29/14 1232 Last data filed at 10/29/14 0944  Gross per 24 hour  Intake    560 ml  Output    300 ml  Net    260 ml   Filed Weights   10/26/14 1345 10/29/14 0543  Weight: 178 lb 12.7 oz (81.1 kg) 183 lb 10.3 oz (83.3 kg)    PHYSICAL EXAM Filed Vitals:   10/28/14 1600 10/28/14 1833 10/28/14 2046 10/29/14 0543  BP:   107/44 110/42  Pulse:   67 69  Temp:   98.4 F (36.9 C) 98.4 F (36.9 C)  TempSrc:   Oral Oral  Resp:   18 18  Height:      Weight:    183 lb 10.3 oz (83.3 kg)  SpO2: 90% 100% 98% 97%   General: Alert, oriented x3, no distress Head: no evidence of trauma, PERRL, EOMI, no exophtalmos or lid lag, no myxedema, no xanthelasma; normal ears, nose and oropharynx Neck: normal jugular venous pulsations and no hepatojugular reflux; weak carotid pulses with delay and bilateral carotid bruits Chest: clear to auscultation, no signs of consolidation by percussion or palpation, normal fremitus, symmetrical and full respiratory excursions Cardiovascular:  normal position and quality of the apical impulse, irregular rhythm, normal first and second heart sounds, no rubs or gallops, 2-3 mid peaking aortic ejection murmur murmur Abdomen: no tenderness or distention, no masses by palpation, no abnormal pulsatility or arterial bruits, normal bowel sounds, no hepatosplenomegaly Extremities: no clubbing, cyanosis or edema; 2+ radial, ulnar and brachial pulses bilaterally; 2+ right femoral, posterior tibial and dorsalis pedis pulses; 2+ left femoral, posterior tibial and dorsalis pedis pulses; no subclavian or femoral bruits Neurological: grossly nonfocal  LABS  CBC  Recent Labs  10/27/14 0500 10/29/14 0550  WBC 15.3* 9.3  HGB 10.4* 9.1*  HCT 34.1* 30.3*  MCV 95.5 93.8  PLT 146* 196*   Basic Metabolic Panel  Recent Labs  10/27/14 0220 10/28/14 0454 10/29/14 0550  NA 140 139 133*  K 4.3 4.3 4.2  CL 102 104 98*  CO2 21* 23 23  GLUCOSE 152* 111* 106*  BUN 63* 73* 74*  CREATININE 4.06* 4.22* 4.51*  CALCIUM 8.3* 8.3* 8.1*  PHOS 7.9*  --   --    Liver Function Tests  Recent Labs  10/27/14 0220  AST 19  ALT 8*  ALKPHOS 79  BILITOT 1.4*  PROT 5.5*  ALBUMIN 2.0*    Radiology Studies Imaging  results have been reviewed and Dg Chest Port 1 View  10/28/2014   CLINICAL DATA:  81 year old female with pleural effusion.  EXAM: PORTABLE CHEST - 1 VIEW  COMPARISON:  Chest x-ray 10/27/2014.  FINDINGS: Lung volumes are low. Extensive bibasilar opacities (right greater than left) may reflect areas of atelectasis and/or consolidation. Small to moderate left and moderate right-sided pleural effusions. Previously noted pleural fluid in the apex of the right hemithorax has redistributed. Small bore right-sided chest tube again noted, tip of which appears to extend into the apex of the right hemithorax. No cephalization of the pulmonary vasculature. Heart size appears mildly enlarged. The patient is rotated to the left on today's exam, resulting in  distortion of the mediastinal contours and reduced diagnostic sensitivity and specificity for mediastinal pathology. Atherosclerosis in the thoracic aorta.  IMPRESSION: 1. Decreased size and read distribution of moderate right pleural effusion. 2. Persistent low lung volumes with bibasilar areas of atelectasis and/or consolidation. 3. Mild cardiomegaly. 4. Atherosclerosis.   Electronically Signed   By: Vinnie Langton M.D.   On: 10/28/2014 08:55   ASSESSMENT AND PLAN   Slow improvement, poor appetite. Encourage PO fluids. Hold off diuretics until eating/drinking better. Very gentle IV fluids.  Sanda Klein, MD, [facility] [phone] office [phone] pager 10/29/2014 12:32 PM

## 2014-10-29 NOTE — Progress Notes (Addendum)
PROGRESS NOTE  [name] MWN:[id number] DOB: 1925/11/04 DOA: 10/24/2014 PCP: Abigail Miyamoto, MD   Brief history 81 year old female with history of stage D severe symptomatic aortic stenosis, stage IV CKD with nonfunctioning AV fistula, chronic right pleural effusion with an indwelling drainage catheter, IDDM/type II DM, chronic A. fib not an anticoagulation candidate because of fall risk, debilitation/weakness, chronic diastolic CHF, remote colon cancer history status post colostomy. The patient has acalculus cholecystitis status post percutaneous cholecystotomy tube February 2016, and was admitted on 10/24/2014 with recurrent cholecystitis. The patient underwent open cholecystectomy on 8/4 per Dr. Donne Hazel and was returned to ICU for observation. After 24 hours observation, the patient was transferred to the medical floor. Assessment/Plan: Acute cholecystitis.  -H/o cholecystitis, choledocholithiasis with recent Biliary drainage perc cholecystostomy tube (04/2014) and ERCP (06/2014) -CT abdomen: Enlarged gallbladder contains small calcified stones with surrounding inflammation suggestive of acute cholecystitis -Open cholecystectomy 10/26/14 -Appreciate cardiology input  -diet per general surgery -PT evaluation--->SNF  CHF recurrent pleural effusion s/p Pleurex, Severe Aortic Stenosis. Echo (08/2014). LVEF: 65-70%. Severe aortic stenosis with Mean gradient (S): 34 mm Hg. -Does not appear to be overtly decompensated -We'll need more accurate I's and O's -Daily weights--discussed with RN--patient has had poor oral intake since surgery -Patient was given furosemide 60 mg IV 1 10/26/2014 -Decision to resume furosemide per cardiology -Patient normally has right-sided Pleurx drained once per week on Tues. -10/29/14--Asked RN to drain Pleurx catheter and document output  Acute on chronic renal failure Stage IV chronic kidney disease - Baseline creatinine 2.3-2.6 -Secondary to  hemodynamic derangements in setting of recent acute cholecystitis -Creatinine appeared to be plateauing, but serum creatinine continues to rise -consulted nephrology--discussed with Dr. Moshe Cipro  Type II DM/IDDM - Held oral hypoglycemics. Continue SSI. - Controlled. -09/22/2014 hemoglobin A1c 6.5  Severe symptomatic aortic stenosis - Cardiology is coordinating care with multiple specialists. Patient and family considering TAVR  Chronic/recurrent right pleural effusion, s/p Pleurx catheter placement - Continue to drain Pleurx catheter once per week - 10/28/2014 chest x-ray revealed slightly improved but reduced due to right-sided moderate pleural effusion  Chronic A. Fib -Rate controlled -Continue carvedilol  -not AC candidate due to falls -CHADS-VASc= 5 -restart ASA 81 mg daily  Anemia - Hemoglobin has slightly dropped. Stable -Baseline hemoglobin approximately 9  Leukocytosis -Remains afebrile and hemodynamically stable -Discontinue antibiotics -Monitor closely for now -No significant pyuria to suggest UTI -WBC improved  Mild thrombocytopenia - Probably due to acute infection. Monitor -No signs of active bleeding presently -This has been chronic dating back to September 2013  Hx of colon cancer -s/p R-hemicolectomy 2002 with ostomy  Family Communication: Pt at beside Disposition Plan: SNF when medically stable     Procedures/Studies: Ct Abdomen Pelvis Wo Contrast  10/24/2014   CLINICAL DATA:  81 year old female with right flank/right upper quadrant pain for 2 days. Nausea and vomiting. Drain in place to drain fluid from lungs and abdomen. Subsequent encounter.  EXAM: CT ABDOMEN AND PELVIS WITHOUT CONTRAST  TECHNIQUE: Multidetector CT imaging of the abdomen and pelvis was performed following the standard protocol without IV contrast.  COMPARISON:  10/24/2014 plain film exam.  09/22/2014 CT.  FINDINGS: Inferior right chest tube is in place with decrease in  size but incomplete clearance of right-sided hydro pneumothorax.  Very small left-sided pleural effusion with basilar atelectasis.  Enlarged gallbladder contains small calcified stones with surrounding inflammation suggestive of acute cholecystitis.  Pneumobilia once again noted  in the common bile duct which may be related to prior sphincterotomy. No calcified common bile duct stone visualized.  Post colectomy with surgical clips surrounding the cecum/right colon and colostomy left lower abdomen with large parastomal hernia containing small and large bowel. Previously stomach partially entered the hernia but has been reduced. No free intraperitoneal air.  Cardiomegaly. Mitral valve and aortic valve calcifications. Coronary artery calcifications.  Atherosclerotic type changes aorta with mild ectasia. Atherosclerotic type changes iliac arteries.  Right kidney rotated anteriorly. No renal or ureteral obstructing stone. Left upper pole 1.2 cm cyst. Calcification left hilum may be vascular in origin.  Taking into account limitation by non contrast imaging, no worrisome hepatic, splenic, pancreatic or adrenal lesion.  Post hysterectomy.  No urinary bladder abnormality noted.  Heterogeneous bone marrow as previously noted. Cannot exclude metastatic disease myeloma. Schmorl's node deformities. Spinal stenosis most notable L4-5.  Scattered normal to top-normal size lymph nodes.  IMPRESSION: Enlarged gallbladder contains small calcified stones with surrounding inflammation suggestive of acute cholecystitis.  Pneumobilia once again noted in the common bile duct which may be related to prior sphincterotomy. No calcified common bile duct stone visualized.  Colostomy left lower abdomen with large parastomal hernia containing small and large bowel.  Inferior right chest tube is in place with decrease in size but incomplete clearance of right-sided hydro pneumothorax.  Very small left-sided pleural effusion with basilar  atelectasis.  Cardiomegaly. Mitral valve and aortic valve calcifications. Coronary artery calcifications.  Atherosclerotic type changes aorta, aortic branch vessels and iliac arteries.  Heterogeneous bone marrow as previously noted. Cannot exclude metastatic disease or myeloma. Schmorl's node deformities. Spinal stenosis most notable L4-5.  These results were called by telephone at the time of interpretation on 10/24/2014 at 7:10 am to Dr. Mingo Amber , who verbally acknowledged these results.   Electronically Signed   By: Genia Del M.D.   On: 10/24/2014 07:22   Dg Chest 2 View  10/03/2014   CLINICAL DATA:  Right pleural drainage catheter inserted on September 12, 2014 4 multiloculated hydro pneumothorax ; experiencing some shortness of breath today  EXAM: CHEST  2 VIEW  COMPARISON:  Portable chest x-ray of September 27, 2014 and PA and lateral chest x-ray of September 12, 2014.  FINDINGS: There has been marked improvement in the appearance of the pulmonary interstitium bilaterally. The cardiac silhouette remains enlarged. The pulmonary vascularity is less engorged than on the previous study. On the right there are at least 2 small air-fluid levels anteriorly in the mid hemi thorax. There is an air-fluid level at the right lung base posteriorly. The small caliber chest tube has its tip projecting over the posterior aspect of the right fourth rib. The bony thorax exhibits no acute abnormality.  IMPRESSION: There has been considerable interval improvement in the appearance of the chest since the previous study, but there remain loculated fluid collections in the anterior aspect of the mid right hemithorax and posteriorly in the inferior aspect of the thorax. The chest tube tip lies superiorly in the upper right pleural space posteriorly.   Electronically Signed   By: Karalina Tift  [place] M.D.   On: 10/03/2014 12:37   Dg Chest Port 1 View  10/28/2014   CLINICAL DATA:  80 year old female with pleural effusion.  EXAM: PORTABLE CHEST - 1 VIEW   COMPARISON:  Chest x-ray 10/27/2014.  FINDINGS: Lung volumes are low. Extensive bibasilar opacities (right greater than left) may reflect areas of atelectasis and/or consolidation. Small to moderate left and moderate  right-sided pleural effusions. Previously noted pleural fluid in the apex of the right hemithorax has redistributed. Small bore right-sided chest tube again noted, tip of which appears to extend into the apex of the right hemithorax. No cephalization of the pulmonary vasculature. Heart size appears mildly enlarged. The patient is rotated to the left on today's exam, resulting in distortion of the mediastinal contours and reduced diagnostic sensitivity and specificity for mediastinal pathology. Atherosclerosis in the thoracic aorta.  IMPRESSION: 1. Decreased size and read distribution of moderate right pleural effusion. 2. Persistent low lung volumes with bibasilar areas of atelectasis and/or consolidation. 3. Mild cardiomegaly. 4. Atherosclerosis.   Electronically Signed   By: Vinnie Langton M.D.   On: 10/28/2014 08:55   Dg Chest Port 1 View  10/27/2014   CLINICAL DATA:  Pleural effusion.  EXAM: PORTABLE CHEST - 1 VIEW  COMPARISON:  10/24/2014 .  CT 08/30/2014.  FINDINGS: Right chest tube in stable position . No pneumothorax on today's exam. Cardiomegaly with bilateral pulmonary interstitial prominence and bilateral pleural effusions, right side greater than left noted. Right apical pleural thickening noted consistent with pleural effusion.  IMPRESSION: 1. Right chest tube in stable position. No pneumothorax noted on today's exam. 2. Congestive heart failure with bilateral pulmonary interstitial edema and bilateral pleural effusion. Right pleural effusion is prominent.   Electronically Signed   By: Marcello Moores  Register   On: 10/27/2014 07:39   Dg Abd Acute W/chest  10/24/2014   CLINICAL DATA:  RIGHT lower quadrant pain beginning 2 days ago, nausea and vomiting. Pleural effusion.  EXAM: DG ABDOMEN  ACUTE W/ 1V CHEST  COMPARISON:  Chest radiograph October 03, 2014  FINDINGS: RIGHT pleural fat extending to the lung apex, with decrease, smaller loose decidual pleural effusion. Cardiac silhouette is moderately enlarged unchanged. Calcified aortic knob. Similar chronic moderate bronchitic changes without focal consolidation. No pneumothorax. Soft tissue planes included osseous structure nonsuspicious, mild degenerative change of the thoracic spine.  Paucity of bowel gas. Surgical clips along LEFT abdomen associated with apparent large hernia, the lateral aspect not fully imaged. The hernia sac appears to contain multiple loops of bowel. No intra-abdominal mass effect or pathologic calcifications. No intraperitoneal free air on the decubitus view. Moderate degenerative change of the lumbar spine.  IMPRESSION: RIGHT chest tube, small residual RIGHT pleural effusion, improved.  Stable cardiomegaly and moderate chronic bronchitic changes.  Large suspected LEFT lateral abdominal wall hernia containing bowel without bowel obstruction.   Electronically Signed   By: Elon Alas M.D.   On: 10/24/2014 05:16         Subjective: Patient's complaining of right upper quadrant pain. She has some nausea without emesis. Denies any fevers, chills, chest discomfort, shortness breath, coughing, hemoptysis. The patient states that she had a bowel movement yesterday. Ate about 25% of her breakfast.  Objective: Filed Vitals:   10/28/14 1600 10/28/14 1833 10/28/14 2046 10/29/14 0543  BP:   107/44 110/42  Pulse:   67 69  Temp:   98.4 F (36.9 C) 98.4 F (36.9 C)  TempSrc:   Oral Oral  Resp:   18 18  Height:      Weight:    83.3 kg (183 lb 10.3 oz)  SpO2: 90% 100% 98% 97%    Intake/Output Summary (Last 24 hours) at 10/29/14 0852 Last data filed at 10/29/14 0838  Gross per 24 hour  Intake    580 ml  Output    575 ml  Net  5 ml   Weight change:  Exam:   General:  Pt is alert, follows commands  appropriately, not in acute distress  HEENT: No icterus, No thrush, No neck mass, [place]/AT  Cardiovascular: RRR, S1/S2, no rubs, no gallops  Respiratory: Bibasilar, right greater than left Crackles and diminished breath sound at this.   Abdomen: Soft/+BS, RUQ tender, non distended, no guarding-incision site without any erythema, drainage-  Extremities: trace edema, No lymphangitis, No petechiae, No rashes, no synovitis  Data Reviewed: Basic Metabolic Panel:  Recent Labs Lab 10/25/14 0445 10/26/14 0350 10/27/14 0220 10/28/14 0454 10/29/14 0550  NA 140 138 140 139 133*  K 3.8 4.8 4.3 4.3 4.2  CL 102 100* 102 104 98*  CO2 29 26 21* 23 23  GLUCOSE 87 84 152* 111* 106*  BUN 54* 58* 63* 73* 74*  CREATININE 3.30* 3.85* 4.06* 4.22* 4.51*  CALCIUM 8.2* 8.2* 8.3* 8.3* 8.1*  PHOS  --   --  7.9*  --   --    Liver Function Tests:  Recent Labs Lab 10/24/14 0500 10/26/14 0350 10/27/14 0220  AST 17 17 19   ALT 8* 6* 8*  ALKPHOS 117 82 79  BILITOT 0.8 1.3* 1.4*  PROT 6.0* 5.5* 5.5*  ALBUMIN 2.7* 2.1* 2.0*    Recent Labs Lab 10/24/14 0500  LIPASE 58*   No results for input(s): AMMONIA in the last 168 hours. CBC:  Recent Labs Lab 10/24/14 0500 10/25/14 0445 10/26/14 0350 10/27/14 0500 10/29/14 0550  WBC 11.8* 9.9 9.7 15.3* 9.3  NEUTROABS 9.7*  --   --   --   --   HGB 11.8* 10.2* 10.2* 10.4* 9.1*  HCT 38.7 34.4* 34.2* 34.1* 30.3*  MCV 94.6 97.5 95.8 95.5 93.8  PLT 141* 138* 141* 146* 137*   Cardiac Enzymes: No results for input(s): CKTOTAL, CKMB, CKMBINDEX, TROPONINI in the last 168 hours. BNP: Invalid input(s): POCBNP CBG:  Recent Labs Lab 10/28/14 1612 10/28/14 2045 10/29/14 0040 10/29/14 0416 10/29/14 0818  GLUCAP 148* 168* 119* 101* 114*    Recent Results (from the past 240 hour(s))  Urine culture     Status: None   Collection Time: 10/24/14  6:03 AM  Result Value Ref Range Status   Specimen Description URINE, RANDOM  Final   Special Requests  ADDED [id number] [phone]  Final   Culture >=100,000 COLONIES/mL ESCHERICHIA COLI  Final   Report Status 10/26/2014 FINAL  Final   Organism ID, Bacteria ESCHERICHIA COLI  Final      Susceptibility   Escherichia coli - MIC*    AMPICILLIN <=2 SENSITIVE Sensitive     CEFAZOLIN <=4 SENSITIVE Sensitive     CEFTRIAXONE <=1 SENSITIVE Sensitive     CIPROFLOXACIN <=0.25 SENSITIVE Sensitive     GENTAMICIN <=1 SENSITIVE Sensitive     IMIPENEM <=0.25 SENSITIVE Sensitive     NITROFURANTOIN <=16 SENSITIVE Sensitive     TRIMETH/SULFA <=20 SENSITIVE Sensitive     AMPICILLIN/SULBACTAM <=2 SENSITIVE Sensitive     PIP/TAZO <=4 SENSITIVE Sensitive     * >=100,000 COLONIES/mL ESCHERICHIA COLI  Surgical pcr screen     Status: None   Collection Time: 10/26/14  5:33 AM  Result Value Ref Range Status   MRSA, PCR NEGATIVE NEGATIVE Final   Staphylococcus aureus NEGATIVE NEGATIVE Final    Comment:        The Xpert SA Assay (FDA approved for NASAL specimens in patients over 38 years of age), is one component of a comprehensive surveillance program.  Test performance has been validated by [facility] for patients greater than or equal to 66 year old. It is not intended to diagnose infection nor to guide or monitor treatment.      Scheduled Meds: . aspirin EC  81 mg Oral QHS  . calcitRIOL  0.25 mcg Oral Daily  . carvedilol  25 mg Oral BID  . heparin  5,000 Units Subcutaneous 3 times per day  . insulin aspart  0-9 Units Subcutaneous 6 times per day  . LORazepam  0.5 mg Oral BID  . mirtazapine  7.5 mg Oral QHS  . saccharomyces boulardii  250 mg Oral BID  . simvastatin  20 mg Oral q1800  . sodium chloride  3 mL Intravenous Q12H   Continuous Infusions:    Theola Cuellar, DO  [facility] Pager [phone]  If 7PM-7AM, please contact night-coverage [email or web address] Password [email or web address] 10/29/2014, 8:52 AM   LOS: 5 days

## 2014-10-29 NOTE — Consult Note (Signed)
[facility] Renal Consultation Note  Requesting MD: Tat Indication for Consultation: A on CKD  HPI:  [name] is a 81 y.o. female very well-known to me with past medical history significant for type 2 diabetes mellitus, remote colon cancer, chronic diastolic CHF as well as severe symptomatic aortic stenosis. I follow her for chronic kidney disease which has progressed with time and most recently creatinine had been around 3 indicating a significant decrease in GFR. Patient has had declining health of late with many hospitalizations this calendar year. Her most recent issues include this cholecystitis and she had a cholecystotomy tube in place. Her aortic stenosis has become more symptomatic as well and she had a chest tube placed due to a chronic pleural effusion.  I actually saw [name] in the office on July 27. She was coming off a recent hospitalization. Her 2 daughters were with her at that clinic appointment. We specifically discussed the pitfalls of providing aggressive therapy to elderly chronically ill patients. She had seen the cardiac surgeons who did not consider her to be a candidate for any kind of valve intervention. They had also  been, appropriately so, afraid to proceed with an open cholecystectomy hence the cholecystotomy tube. I also discussed how chronic dialysis therapy is problematic especially in people with severe aortic stenosis. We had a very good discussion on that day on July 27 where I expressed that I did not feel she was a candidate for dialysis therapy as we would likely make her quality of life worse. Unfortunately, she presented with abdominal pain on August 2 and had to undergo an open cholecystectomy procedure. Where she has done pretty well in her recovery she's now had worsening of renal function and decreased urine output. Per the family she is confused. I've been asked to come and see her for this issue.  CREATININE, SER  Date/Time Value Ref  Range Status  10/29/2014 05:50 AM 4.51* 0.44 - 1.00 mg/dL Final  10/28/2014 04:54 AM 4.22* 0.44 - 1.00 mg/dL Final  10/27/2014 02:20 AM 4.06* 0.44 - 1.00 mg/dL Final  10/26/2014 03:50 AM 3.85* 0.44 - 1.00 mg/dL Final  10/25/2014 04:45 AM 3.30* 0.44 - 1.00 mg/dL Final  10/24/2014 05:00 AM 3.03* 0.44 - 1.00 mg/dL Final  10/20/2014 03:25 PM 2.97* 0.44 - 1.00 mg/dL Final  09/28/2014 05:03 AM 2.98* 0.44 - 1.00 mg/dL Final  09/27/2014 11:33 AM 3.23* 0.44 - 1.00 mg/dL Final  09/26/2014 03:48 AM 3.28* 0.44 - 1.00 mg/dL Final  09/25/2014 03:50 AM 3.33* 0.44 - 1.00 mg/dL Final  09/23/2014 03:25 AM 2.51* 0.44 - 1.00 mg/dL Final  09/22/2014 11:37 AM 2.70* 0.44 - 1.00 mg/dL Final  09/15/2014 02:48 PM 2.44* 0.44 - 1.00 mg/dL Final  09/12/2014 06:42 AM 2.48* 0.44 - 1.00 mg/dL Final  09/08/2014 03:17 PM 2.28* 0.40 - 1.20 mg/dL Final  08/29/2014 04:21 PM 2.59* 0.40 - 1.20 mg/dL Final  07/24/2014 04:20 AM 3.00* 0.44 - 1.00 mg/dL Final  07/23/2014 06:20 AM 3.46* 0.44 - 1.00 mg/dL Final  07/22/2014 03:53 AM 3.23* 0.50 - 1.10 mg/dL Final  07/21/2014 04:16 AM 3.10* 0.50 - 1.10 mg/dL Final  07/20/2014 03:56 AM 2.84* 0.50 - 1.10 mg/dL Final  07/19/2014 04:42 AM 2.75* 0.50 - 1.10 mg/dL Final  07/18/2014 05:38 AM 2.63* 0.50 - 1.10 mg/dL Final  07/17/2014 05:18 AM 2.85* 0.50 - 1.10 mg/dL Final  07/16/2014 07:02 AM 2.93* 0.50 - 1.10 mg/dL Final  07/15/2014 05:05 AM 2.90* 0.50 - 1.10 mg/dL Final  07/14/2014 11:25 AM 2.69* 0.50 - 1.10 mg/dL Final  05/16/2014 05:28 AM 3.00* 0.50 - 1.10 mg/dL Final  05/15/2014 05:35 AM 3.20* 0.50 - 1.10 mg/dL Final  05/14/2014 04:32 AM 3.28* 0.50 - 1.10 mg/dL Final  05/13/2014 04:50 AM 3.06* 0.50 - 1.10 mg/dL Final  05/12/2014 06:11 AM 2.99* 0.50 - 1.10 mg/dL Final  05/11/2014 06:14 AM 2.65* 0.50 - 1.10 mg/dL Final  05/10/2014 04:37 AM 2.59* 0.50 - 1.10 mg/dL Final  05/09/2014 08:43 PM 2.65* 0.50 - 1.10 mg/dL Final  03/18/2014 03:29 AM 2.34* 0.50 - 1.10 mg/dL Final  03/16/2014  04:05 AM 2.63* 0.50 - 1.10 mg/dL Final  03/15/2014 09:14 AM 2.76* 0.50 - 1.10 mg/dL Final  03/14/2014 04:25 AM 2.58* 0.50 - 1.10 mg/dL Final  03/13/2014 04:07 AM 2.30* 0.50 - 1.10 mg/dL Final  03/12/2014 04:15 PM 2.50* 0.50 - 1.10 mg/dL Final  11/12/2013 01:30 PM 2.46* 0.50 - 1.10 mg/dL Final  12/28/2012 08:55 PM 2.53* 0.50 - 1.10 mg/dL Final  09/03/2012 02:50 PM 2.68* 0.50 - 1.10 mg/dL Final  05/16/2012 12:19 PM 2.70* 0.50 - 1.10 mg/dL Final  01/17/2012 01:51 PM 3.00* 0.50 - 1.10 mg/dL Final  12/26/2011 05:40 AM 3.34* 0.50 - 1.10 mg/dL Final  12/25/2011 05:31 AM 3.66* 0.50 - 1.10 mg/dL Final  12/24/2011 10:10 AM 4.14* 0.50 - 1.10 mg/dL Final  12/23/2011 06:50 AM 4.37* 0.50 - 1.10 mg/dL Final  12/22/2011 02:34 PM 4.14* 0.50 - 1.10 mg/dL Final     PMHx:   Past Medical History  Diagnosis Date  . CKD (chronic kidney disease), stage IV 2014    a. Baseline Cr reported 2.5-2.6.  Marland Kitchen Anemia   . Urinary tract infection   . Colon cancer 1980s  . Chronic atrial fibrillation     a. not on anticoagulation due to increased risk of falls, advanced age.  . Chronic diastolic CHF (congestive heart failure)     a. Echo 10/2013: mild LVH, EF 60-65%, mod AS, mildly dilated LA, mod dilated RA, PASP 33.  Marland Kitchen Hypertension   . Carotid disease, bilateral     a. Carotid duplex 09/6806: 81-10% RICA/LICA.  Marland Kitchen H/O cardiovascular stress test     a. 2002: nuc negative for ischemia, EF 65%.  . Severe aortic stenosis 08/23/2014  . Depression with anxiety     situational  . Headache   . Arthritis   . Recurrent pleural effusion on right     s/p multiple taps prior to Pleurex catheter placement  . Choledocholithiasis with acute cholecystitis 06/14/2014  . Clostridium difficile diarrhea 09/2014  . Type II diabetes mellitus with complication 06/05/9456    Retinopathy, nepropathy.   . Thrombocytopenia 08/29/2014  . Cholecystitis 10/2014    Past Surgical History  Procedure Laterality Date  . Abdominal hysterectomy     . Colon surgery  2002    right colectomy for recurrent cancer  . Appendectomy    . Colostomy  1980s    low anterior resection of rectal cancer.   . Cardioversion  09/30/2011    Procedure: CARDIOVERSION;  Surgeon: [name], MD;  Location: [place];  Service: Cardiovascular;  Laterality: N/A;  . I&d extremity  11/20/2011    Procedure: IRRIGATION AND DEBRIDEMENT EXTREMITY;  Surgeon: Alta Corning, MD;  Location: [place];  Service: Orthopedics;  Laterality: Left;  . Av fistula placement Left 06/02/2012    Procedure: ARTERIOVENOUS (AV) FISTULA CREATION;  Surgeon: Conrad [place], MD;  Location: [place];  Service: Vascular;  Laterality: Left;  Bascilic Fistula  . Bascilic vein transposition Left 08/18/2012    Procedure: BASCILIC VEIN TRANSPOSITION;  Surgeon: Conrad [place], MD;  Location: [place];  Service: Vascular;  Laterality: Left;  Left 2nd stage Basilic Vein Transposition   . Ercp N/A 07/11/2014    Procedure: ENDOSCOPIC RETROGRADE CHOLANGIOPANCREATOGRAPHY (ERCP);  Surgeon: Inda Castle, MD;  Location: [facility];  Service: Endoscopy;  Laterality: N/A;  . Biliary stent placement N/A 07/11/2014    Procedure: BILIARY STENT PLACEMENT;  Surgeon: Inda Castle, MD;  Location: [facility];  Service: Endoscopy;  Laterality: N/A;  . Ercp N/A 07/21/2014    Procedure: ENDOSCOPIC RETROGRADE CHOLANGIOPANCREATOGRAPHY (ERCP);  Surgeon: Inda Castle, MD;  Location: [facility];  Service: Endoscopy;  Laterality: N/A;  . Fracture surgery      Right leg  . Chest tube insertion Right 09/12/2014    Procedure: INSERTION PLEURAL DRAINAGE CATHETER;  Surgeon: Melrose Nakayama, MD;  Location: [place];  Service: Thoracic;  Laterality: Right;  . Ct perc cholecystostomy  05/11/2014  . Pleural effusion drainage  09/12/2014  . Cholecystectomy N/A 10/26/2014    Procedure: OPEN CHOLECYSTECTOMY;  Surgeon: Rolm Bookbinder, MD;  Location: [facility];  Service: General;  Laterality: N/A;    Family Hx:  Family History  Problem  Relation Age of Onset  . Heart disease Father   . Diabetes Sister   . Diabetes Daughter   . Hyperlipidemia Daughter   . Hypertension Daughter   . Other Daughter     varicose veins  . Cancer Son   . Diabetes Son   . Heart disease Son     before age 14  . Hyperlipidemia Son   . Hypertension Son   . Heart attack Father   . Stroke Father     Social History:  reports that she has never smoked. Her smokeless tobacco use includes Chew. She reports that she does not drink alcohol or use illicit drugs.  Allergies:  Allergies  Allergen Reactions  . Levaquin [Levofloxacin] Nausea Only  . Codeine Nausea Only  . Keflet [Cephalexin] Nausea Only  . Morphine And Related Nausea Only  . Oxycodone Other (See Comments)    Abnormal behavior    Medications: Prior to Admission medications   Medication Sig Start Date End Date Taking? Authorizing Provider  acetaminophen (TYLENOL) 500 MG tablet Take 500 mg by mouth every 6 (six) hours as needed for mild pain.    Yes Historical Provider, MD  aspirin EC 81 MG tablet Take 81 mg by mouth at bedtime.    Yes Historical Provider, MD  calcitRIOL (ROCALTROL) 0.25 MCG capsule Take 0.25 mcg by mouth daily.  04/06/12  Yes Historical Provider, MD  carvedilol (COREG) 25 MG tablet Take 25 mg by mouth 2 (two) times daily. 09/15/14  Yes Historical Provider, MD  clotrimazole-betamethasone (LOTRISONE) lotion Apply 1 application topically daily as needed (apply to rash on stomach as needed).  05/25/14  Yes Historical Provider, MD  docusate sodium (COLACE) 100 MG capsule Take 100 mg by mouth daily as needed for constipation.    Yes Historical Provider, MD  epoetin alfa (EPOGEN,PROCRIT) 25053 UNIT/ML injection 20,000 Units. Every two weeks   Yes Historical Provider, MD  furosemide (LASIX) 80 MG tablet Take 1 tablet (80 mg total) by mouth 2 (two) times daily. 09/08/14  Yes [name], MD  HYDROcodone-acetaminophen (NORCO/VICODIN) 5-325 MG per tablet Take 1 tablet by mouth  every 4 (four) hours as needed for moderate pain. 05/16/14  Yes Shanon Brow  Tat, MD  insulin glargine (LANTUS) 100 UNIT/ML injection Inject 8 Units into the skin at bedtime as needed (if cbg over 200).    Yes Historical Provider, MD  linagliptin (TRADJENTA) 5 MG TABS tablet Take 5 mg by mouth daily.    Yes Historical Provider, MD  LORazepam (ATIVAN) 0.5 MG tablet Take 0.5 mg by mouth 2 (two) times daily.   Yes Historical Provider, MD  meclizine (ANTIVERT) 12.5 MG tablet Take 12.5 mg by mouth as needed for dizziness.  11/23/12  Yes Historical Provider, MD  mirtazapine (REMERON) 7.5 MG tablet Take 7.5 mg by mouth at bedtime.  09/28/12  Yes Historical Provider, MD  promethazine (PHENERGAN) 12.5 MG tablet Take 1 tablet (12.5 mg total) by mouth every 6 (six) hours as needed for nausea or vomiting. 03/14/14  Yes Ripudeep Krystal Eaton, MD  saccharomyces boulardii (FLORASTOR) 250 MG capsule Take 1 capsule (250 mg total) by mouth 2 (two) times daily. 09/29/14  Yes Florencia Reasons, MD  simvastatin (ZOCOR) 20 MG tablet TAKE 1 TABLET (20 MG TOTAL) BY MOUTH AT BEDTIME. 08/28/14  Yes [name], MD  traMADol (ULTRAM) 50 MG tablet Take 1 tablet (50 mg total) by mouth every 6 (six) hours as needed for moderate pain. 09/12/14  Yes Melrose Nakayama, MD  vitamin B-12 (CYANOCOBALAMIN) 1000 MCG tablet Take 1,000 mcg by mouth daily.   Yes Historical Provider, MD    I have reviewed the patient's current medications.  Labs:  Results for orders placed or performed during the hospital encounter of 10/24/14 (from the past 48 hour(s))  Glucose, capillary     Status: Abnormal   Collection Time: 10/27/14  5:13 PM  Result Value Ref Range   Glucose-Capillary 144 (H) 65 - 99 mg/dL  Glucose, capillary     Status: Abnormal   Collection Time: 10/27/14  8:01 PM  Result Value Ref Range   Glucose-Capillary 131 (H) 65 - 99 mg/dL   Comment 1 Notify RN    Comment 2 Document in Chart   Glucose, capillary     Status: Abnormal   Collection Time: 10/28/14  12:15 AM  Result Value Ref Range   Glucose-Capillary 113 (H) 65 - 99 mg/dL   Comment 1 Notify RN    Comment 2 Document in Chart   Glucose, capillary     Status: Abnormal   Collection Time: 10/28/14  4:10 AM  Result Value Ref Range   Glucose-Capillary 124 (H) 65 - 99 mg/dL   Comment 1 Notify RN    Comment 2 Document in Chart   Basic metabolic panel     Status: Abnormal   Collection Time: 10/28/14  4:54 AM  Result Value Ref Range   Sodium 139 135 - 145 mmol/L   Potassium 4.3 3.5 - 5.1 mmol/L   Chloride 104 101 - 111 mmol/L   CO2 23 22 - 32 mmol/L   Glucose, Bld 111 (H) 65 - 99 mg/dL   BUN 73 (H) 6 - 20 mg/dL   Creatinine, Ser 4.22 (H) 0.44 - 1.00 mg/dL   Calcium 8.3 (L) 8.9 - 10.3 mg/dL   GFR calc non Af Amer 9 (L) >60 mL/min   GFR calc Af Amer 10 (L) >60 mL/min    Comment: (NOTE) The eGFR has been calculated using the CKD EPI equation. This calculation has not been validated in all clinical situations. eGFR's persistently <60 mL/min signify possible Chronic Kidney Disease.    Anion gap 12 5 - 15  Glucose, capillary  Status: Abnormal   Collection Time: 10/28/14  7:39 AM  Result Value Ref Range   Glucose-Capillary 104 (H) 65 - 99 mg/dL  Glucose, capillary     Status: Abnormal   Collection Time: 10/28/14 11:39 AM  Result Value Ref Range   Glucose-Capillary 167 (H) 65 - 99 mg/dL  Glucose, capillary     Status: Abnormal   Collection Time: 10/28/14  4:12 PM  Result Value Ref Range   Glucose-Capillary 148 (H) 65 - 99 mg/dL  Glucose, capillary     Status: Abnormal   Collection Time: 10/28/14  8:45 PM  Result Value Ref Range   Glucose-Capillary 168 (H) 65 - 99 mg/dL  Glucose, capillary     Status: Abnormal   Collection Time: 10/29/14 12:40 AM  Result Value Ref Range   Glucose-Capillary 119 (H) 65 - 99 mg/dL  Glucose, capillary     Status: Abnormal   Collection Time: 10/29/14  4:16 AM  Result Value Ref Range   Glucose-Capillary 101 (H) 65 - 99 mg/dL  Basic metabolic  panel     Status: Abnormal   Collection Time: 10/29/14  5:50 AM  Result Value Ref Range   Sodium 133 (L) 135 - 145 mmol/L   Potassium 4.2 3.5 - 5.1 mmol/L   Chloride 98 (L) 101 - 111 mmol/L   CO2 23 22 - 32 mmol/L   Glucose, Bld 106 (H) 65 - 99 mg/dL   BUN 74 (H) 6 - 20 mg/dL   Creatinine, Ser 4.51 (H) 0.44 - 1.00 mg/dL   Calcium 8.1 (L) 8.9 - 10.3 mg/dL   GFR calc non Af Amer 8 (L) >60 mL/min   GFR calc Af Amer 9 (L) >60 mL/min    Comment: (NOTE) The eGFR has been calculated using the CKD EPI equation. This calculation has not been validated in all clinical situations. eGFR's persistently <60 mL/min signify possible Chronic Kidney Disease.    Anion gap 12 5 - 15  CBC     Status: Abnormal   Collection Time: 10/29/14  5:50 AM  Result Value Ref Range   WBC 9.3 4.0 - 10.5 K/uL   RBC 3.23 (L) 3.87 - 5.11 MIL/uL   Hemoglobin 9.1 (L) 12.0 - 15.0 g/dL   HCT 30.3 (L) 36.0 - 46.0 %   MCV 93.8 78.0 - 100.0 fL   MCH 28.2 26.0 - 34.0 pg   MCHC 30.0 30.0 - 36.0 g/dL   RDW 16.1 (H) 11.5 - 15.5 %   Platelets 137 (L) 150 - 400 K/uL  Glucose, capillary     Status: Abnormal   Collection Time: 10/29/14  8:18 AM  Result Value Ref Range   Glucose-Capillary 114 (H) 65 - 99 mg/dL  Glucose, capillary     Status: Abnormal   Collection Time: 10/29/14 12:31 PM  Result Value Ref Range   Glucose-Capillary 167 (H) 65 - 99 mg/dL     ROS:  A comprehensive review of systems was negative except for: Gastrointestinal: positive for abdominal pain  Physical Exam: Filed Vitals:   10/29/14 0543  BP: 110/42  Pulse: 69  Temp: 98.4 F (36.9 C)  Resp: 18     General: Elderly white female. She recognizes me but also has been confused per the family HEENT: Pupils equal reactive to light symmetrical motions are intact, mucous members are moist Neck: No jugular venous distention Heart: Rate and rhythm Lungs: Decreased breath sounds at the bases. Also with thoracentesis tube in place Abdomen: Distended.  Postop mildly  tender to palpation Extremities: Pitting edema  Skin: Warm and dry Neuro: Alert. Confused per the family. Remainder of the neurologic exam is nonfocal  Assessment/Plan: 81 year old white female with multiple medical issues including advanced chronic kidney disease. She now presents requiring an open cholecystectomy and a suffered acute on chronic renal failure 1.Renal- advanced chronic any disease at baseline. Patient has had a dialysis access placed in the past in preparation for possible dialysis therapy. However, of late, patient has become more chronically ill and discussions have recently been held stating that patient is no longer a candidate for dialysis therapy. I reiterated my thoughts on that today and daughter is present and understands. I suspect this acute on chronic renal failure is due to decreased renal perfusion. She is on Coreg and a pretty high-dose. I will decrease that dose and she is also getting a little bit of IV fluids. I told family that we'll have to hope that this turns around otherwise we may not have much to offer her. They understand 2. Hypertension/volume  - hypotensive but volume overloaded. She is oliguric. Receiving short-term IVF to try to induce urine output. 3. Anemia  - had been getting an ESA as outpatient. Down due to surgery. No action needed at this time 4. Bones- secondary hyperparathyroidism secondary to CKD. Okay to continue calcitriol  Thank for this consultation. We will continue to follow with you   Rajan Burgard A 10/29/2014, 1:09 PM

## 2014-10-29 NOTE — Progress Notes (Signed)
GS Progress Note Subjective: Pain well controlled, does not like food, but no nausea or vomiting  Objective: Vital signs in last 24 hours: Temp:  [97.5 F (36.4 C)-98.4 F (36.9 C)] 98.4 F (36.9 C) (08/07 0543) Pulse Rate:  [61-69] 69 (08/07 0543) Resp:  [17-18] 18 (08/07 0543) BP: (107-112)/(42-50) 110/42 mmHg (08/07 0543) SpO2:  [90 %-100 %] 97 % (08/07 0543) Weight:  [83.3 kg (183 lb 10.3 oz)] 83.3 kg (183 lb 10.3 oz) (08/07 0543) Last BM Date: 10/28/14  Intake/Output from previous day: 08/06 0701 - 08/07 0700 In: 98 [P.O.:580] Out: 525 [Urine:525] Intake/Output this shift: Total I/O In: 220 [P.O.:220] Out: 50 [Urine:50]  Lungs: CTAB  Abd: soft, NT, ND, RUQ wound CDI  Extremities: min edema  Neuro: AOx4  Lab Results: CBC   Recent Labs  10/27/14 0500 10/29/14 0550  WBC 15.3* 9.3  HGB 10.4* 9.1*  HCT 34.1* 30.3*  PLT 146* 137*   BMET  Recent Labs  10/28/14 0454 10/29/14 0550  NA 139 133*  K 4.3 4.2  CL 104 98*  CO2 23 23  GLUCOSE 111* 106*  BUN 73* 74*  CREATININE 4.22* 4.51*  CALCIUM 8.3* 8.1*   PT/INR No results for input(s): LABPROT, INR in the last 72 hours. ABG No results for input(s): PHART, HCO3 in the last 72 hours.  Invalid input(s): PCO2, PO2  Studies/Results: Dg Chest Port 1 View  10/28/2014   CLINICAL DATA:  81 year old female with pleural effusion.  EXAM: PORTABLE CHEST - 1 VIEW  COMPARISON:  Chest x-ray 10/27/2014.  FINDINGS: Lung volumes are low. Extensive bibasilar opacities (right greater than left) may reflect areas of atelectasis and/or consolidation. Small to moderate left and moderate right-sided pleural effusions. Previously noted pleural fluid in the apex of the right hemithorax has redistributed. Small bore right-sided chest tube again noted, tip of which appears to extend into the apex of the right hemithorax. No cephalization of the pulmonary vasculature. Heart size appears mildly enlarged. The patient is rotated to the  left on today's exam, resulting in distortion of the mediastinal contours and reduced diagnostic sensitivity and specificity for mediastinal pathology. Atherosclerosis in the thoracic aorta.  IMPRESSION: 1. Decreased size and read distribution of moderate right pleural effusion. 2. Persistent low lung volumes with bibasilar areas of atelectasis and/or consolidation. 3. Mild cardiomegaly. 4. Atherosclerosis.   Electronically Signed   By: Vinnie Langton M.D.   On: 10/28/2014 08:55    Anti-infectives: Anti-infectives    Start     Dose/Rate Route Frequency Ordered Stop   10/27/14 1100  cefTRIAXone (ROCEPHIN) 1 g in dextrose 5 % 50 mL IVPB  Status:  Discontinued     1 g 100 mL/hr over 30 Minutes Intravenous Every 24 hours 10/27/14 1012 10/28/14 1004   10/24/14 1100  piperacillin-tazobactam (ZOSYN) IVPB 2.25 g  Status:  Discontinued     2.25 g 100 mL/hr over 30 Minutes Intravenous 3 times per day 10/24/14 1035 10/27/14 1012   10/24/14 0730  piperacillin-tazobactam (ZOSYN) IVPB 3.375 g     3.375 g 100 mL/hr over 30 Minutes Intravenous  Once 10/24/14 0718 10/24/14 0832      Medicaions: Scheduled Meds: . aspirin EC  81 mg Oral QHS  . calcitRIOL  0.25 mcg Oral Daily  . carvedilol  25 mg Oral BID  . heparin  5,000 Units Subcutaneous 3 times per day  . insulin aspart  0-9 Units Subcutaneous TID WC  . LORazepam  0.5 mg Oral BID  .  mirtazapine  7.5 mg Oral QHS  . saccharomyces boulardii  250 mg Oral BID  . simvastatin  20 mg Oral q1800  . sodium chloride  3 mL Intravenous Q12H   Continuous Infusions:  PRN Meds:.acetaminophen **OR** acetaminophen, fentaNYL (SUBLIMAZE) injection, fentaNYL (SUBLIMAZE) injection, levalbuterol, ondansetron (ZOFRAN) IV, traMADol  Assessment/Plan: s/p Procedure(s): OPEN CHOLECYSTECTOMY Tolerating diet Follow up with Dr. Donne Hazel in 2 weeks No further general surgery issues  LOS: 5 days    [place] Surgeon [phone] Surgery 10/29/2014

## 2014-10-29 NOTE — Progress Notes (Signed)
Pleurex drained for 35mL, gauze to site changed. No redness or irritation noted to pleurex site. Also, staples to abdominal  area remain  intact, no redness or drainage noted at this time. Patient is resting comfortably up in chair. Family present at bedside and changing colostomy with patient. Call bell is within reach. Will continue to monitor.

## 2014-10-30 LAB — GLUCOSE, CAPILLARY
GLUCOSE-CAPILLARY: 172 mg/dL — AB (ref 65–99)
GLUCOSE-CAPILLARY: 183 mg/dL — AB (ref 65–99)
Glucose-Capillary: 128 mg/dL — ABNORMAL HIGH (ref 65–99)
Glucose-Capillary: 148 mg/dL — ABNORMAL HIGH (ref 65–99)
Glucose-Capillary: 156 mg/dL — ABNORMAL HIGH (ref 65–99)
Glucose-Capillary: 183 mg/dL — ABNORMAL HIGH (ref 65–99)

## 2014-10-30 LAB — RENAL FUNCTION PANEL
ANION GAP: 10 (ref 5–15)
Albumin: 1.9 g/dL — ABNORMAL LOW (ref 3.5–5.0)
BUN: 79 mg/dL — ABNORMAL HIGH (ref 6–20)
CO2: 22 mmol/L (ref 22–32)
CREATININE: 4.23 mg/dL — AB (ref 0.44–1.00)
Calcium: 7.9 mg/dL — ABNORMAL LOW (ref 8.9–10.3)
Chloride: 99 mmol/L — ABNORMAL LOW (ref 101–111)
GFR, EST AFRICAN AMERICAN: 10 mL/min — AB (ref >60)
GFR, EST NON AFRICAN AMERICAN: 9 mL/min — AB (ref >60)
Glucose, Bld: 133 mg/dL — ABNORMAL HIGH (ref 65–99)
Phosphorus: 5.5 mg/dL — ABNORMAL HIGH (ref 2.5–4.6)
Potassium: 4.4 mmol/L (ref 3.5–5.1)
Sodium: 131 mmol/L — ABNORMAL LOW (ref 135–145)

## 2014-10-30 MED ORDER — DARBEPOETIN ALFA 100 MCG/0.5ML IJ SOSY
100.0000 ug | PREFILLED_SYRINGE | INTRAMUSCULAR | Status: DC
  Administered 2014-10-30: 100 ug via SUBCUTANEOUS
  Filled 2014-10-30: qty 0.5

## 2014-10-30 NOTE — Care Management Note (Signed)
Case Management Note  Patient Details  Name: [name] MRN: [id number] Date of Birth: [birth year]  Subjective/Objective:    CM continues to follow progression of pt towards discharge. Pt lives with daughter in private residence but has communicated she is unable to assist with mother in her deconditioned state due to her own Back problems. There has been communication in the past with the Palliative Care team but they have not been consulted during this admission. PT has recommended SNF as she is deconditioned s/p Open Cholecystectomy 8/4 and CSW is aware.    Pt is being followed closely by Nephrology and Cardiology as her Baseline Creatinine is 2.3-2.6 and it is now 4.23. The MD is awaiting a plateau.          Action/Plan: Will continue to follow for final disposition and discharge needs. She does have a Pleur-X Catheter that is drained Q Tuesday for Chronic Pleural Effusion.     Expected Discharge Date:                  Expected Discharge Plan:  [place]  In-House Referral:  Clinical Social Work  Discharge planning Services  CM Consult (pt had HHRN, HHPT, and Nurse Aide with [facility] after  hospital Discharge July. )  Post Acute Care Choice:    Choice offered to:     DME Arranged:    DME Agency:  [place]:    [facility] Agency:     Status of Service:  In process, will continue to follow  Medicare Important Message Given:  Yes-second notification given Date Medicare IM Given:    Medicare IM give by:    Date Additional Medicare IM Given:    Additional Medicare Important Message give by:     If discussed at [place] of Stay Meetings, dates discussed:    Additional Comments:  Delrae Sawyers, RN 10/30/2014, 11:36 AM

## 2014-10-30 NOTE — Discharge Instructions (Signed)
[facility]      [facility] [phone]  OPEN ABDOMINAL SURGERY: POST OP INSTRUCTIONS  Always review your discharge instruction sheet given to you by the facility where your surgery was performed.  IF YOU HAVE DISABILITY OR FAMILY LEAVE FORMS, YOU MUST BRING THEM TO THE OFFICE FOR PROCESSING.  PLEASE DO NOT GIVE THEM TO YOUR DOCTOR.  1. A prescription for pain medication may be given to you upon discharge.  Take your pain medication as prescribed, if needed.  If narcotic pain medicine is not needed, then you may take acetaminophen (Tylenol) or ibuprofen (Advil) as needed. 2. Take your usually prescribed medications unless otherwise directed. 3. If you need a refill on your pain medication, please contact your pharmacy. They will contact our office to request authorization.  Prescriptions will not be filled after 5pm or on week-ends. 4. You should follow a light diet the first few days after arrival home, such as soup and crackers, pudding, etc.unless your doctor has advised otherwise. A high-fiber, low fat diet can be resumed as tolerated.   Be sure to include lots of fluids daily. Most patients will experience some swelling and bruising on the chest and neck area.  Ice packs will help.  Swelling and bruising can take several days to resolve 5. Most patients will experience some swelling and bruising in the area of the incision. Ice pack will help. Swelling and bruising can take several days to resolve..  6. It is common to experience some constipation if taking pain medication after surgery.  Increasing fluid intake and taking a stool softener will usually help or prevent this problem from occurring.  A mild laxative (Milk of Magnesia or Miralax) should be taken according to package directions if there are no bowel movements after 48 hours. 7.  You may have steri-strips (small skin tapes) in place directly over the incision.  These strips should be left on the skin for 7-10 days.  If your  surgeon used skin glue on the incision, you may shower in 24 hours.  The glue will flake off over the next 2-3 weeks.  Any sutures or staples will be removed at the office during your follow-up visit. You may find that a light gauze bandage over your incision may keep your staples from being rubbed or pulled. You may shower and replace the bandage daily. 8. ACTIVITIES:  You may resume regular (light) daily activities beginning the next day--such as daily self-care, walking, climbing stairs--gradually increasing activities as tolerated.  You may have sexual intercourse when it is comfortable.  Refrain from any heavy lifting or straining until approved by your doctor. a. You may drive when you no longer are taking prescription pain medication, you can comfortably wear a seatbelt, and you can safely maneuver your car and apply brakes b. Return to Work: ___________________________________ 9. You should see your doctor in the office for a follow-up appointment approximately two weeks after your surgery.  Make sure that you call for this appointment within a day or two after you arrive home to insure a convenient appointment time. OTHER INSTRUCTIONS:  _____________________________________________________________ _____________________________________________________________  WHEN TO CALL YOUR DOCTOR: 1. Fever over 101.0 2. Inability to urinate 3. Nausea and/or vomiting 4. Extreme swelling or bruising 5. Continued bleeding from incision. 6. Increased pain, redness, or drainage from the incision. 7. Difficulty swallowing or breathing 8. Muscle cramping or spasms. 9. Numbness or tingling in hands or feet or around lips.  The clinic staff is available to   answer your questions during regular business hours.  Please don't hesitate to call and ask to speak to one of the nurses if you have concerns.  For further questions, please visit [email or web address]   

## 2014-10-30 NOTE — Care Management Important Message (Signed)
Important Message  Patient Details  Name: [name] MRN: [id number] Date of Birth: 10-02-1925   Medicare Important Message Given:  Yes-third notification given    Pricilla Handler 10/30/2014, 3:11 PM

## 2014-10-30 NOTE — Progress Notes (Signed)
Patient Name: [name] Date of Encounter: 10/30/2014     Active Problems:   Type II diabetes mellitus with complication   Acute cholecystitis   Choledocholithiasis with acute cholecystitis   Aortic stenosis, severe   Preoperative cardiovascular examination   Cholecystitis   Acute renal failure superimposed on stage 4 chronic kidney disease    SUBJECTIVE  Denies any chest discomfort or shortness of breath.  She is having less right upper quadrant discomfort.  CURRENT MEDS . aspirin EC  81 mg Oral QHS  . calcitRIOL  0.25 mcg Oral Daily  . carvedilol  6.25 mg Oral BID  . heparin  5,000 Units Subcutaneous 3 times per day  . insulin aspart  0-9 Units Subcutaneous TID WC  . LORazepam  0.5 mg Oral BID  . mirtazapine  7.5 mg Oral QHS  . saccharomyces boulardii  250 mg Oral BID  . simvastatin  20 mg Oral q1800  . sodium chloride  3 mL Intravenous Q12H    OBJECTIVE  Filed Vitals:   10/29/14 0543 10/29/14 1529 10/29/14 2144 10/30/14 0546  BP: 110/42 105/50 107/42 141/56  Pulse: 69 65 67 69  Temp: 98.4 F (36.9 C) 98.3 F (36.8 C) 97.7 F (36.5 C) 97.9 F (36.6 C)  TempSrc: Oral Oral Oral Oral  Resp: 18 18 18 16   Height:      Weight: 183 lb 10.3 oz (83.3 kg)   183 lb 11 oz (83.32 kg)  SpO2: 97% 93% 92% 100%    Intake/Output Summary (Last 24 hours) at 10/30/14 0817 Last data filed at 10/29/14 2225  Gross per 24 hour  Intake  692.5 ml  Output    400 ml  Net  292.5 ml   Filed Weights   10/26/14 1345 10/29/14 0543 10/30/14 0546  Weight: 178 lb 12.7 oz (81.1 kg) 183 lb 10.3 oz (83.3 kg) 183 lb 11 oz (83.32 kg)    PHYSICAL EXAM  General: Pleasant, NAD. Neuro: Alert and oriented X 3. Moves all extremities spontaneously. Psych: Normal affect. HEENT:  Normal  Neck: Supple without bruits or JVD. Lungs: Fine rales bilaterally Heart: Grade 3/6 harsh systolic ejection murmur at the base.  Pulse is irregularly irregular in atrial fibrillation.  No gallop or  rub Abdomen: Soft, non-tender, non-distended, BS + x 4.  Right upper quadrant incision appears to be stable Extremities: No clubbing, cyanosis or edema. DP/PT/Radials 2+ and equal bilaterally.  Accessory Clinical Findings  CBC  Recent Labs  10/29/14 0550  WBC 9.3  HGB 9.1*  HCT 30.3*  MCV 93.8  PLT 737*   Basic Metabolic Panel  Recent Labs  10/29/14 0550 10/30/14 0700  NA 133* 131*  K 4.2 4.4  CL 98* 99*  CO2 23 22  GLUCOSE 106* 133*  BUN 74* 79*  CREATININE 4.51* 4.23*  CALCIUM 8.1* 7.9*  PHOS  --  5.5*   Liver Function Tests  Recent Labs  10/30/14 0700  ALBUMIN 1.9*   No results for input(s): LIPASE, AMYLASE in the last 72 hours. Cardiac Enzymes No results for input(s): CKTOTAL, CKMB, CKMBINDEX, TROPONINI in the last 72 hours. BNP Invalid input(s): POCBNP D-Dimer No results for input(s): DDIMER in the last 72 hours. Hemoglobin A1C No results for input(s): HGBA1C in the last 72 hours. Fasting Lipid Panel No results for input(s): CHOL, HDL, LDLCALC, TRIG, CHOLHDL, LDLDIRECT in the last 72 hours. Thyroid Function Tests No results for input(s): TSH, T4TOTAL, T3FREE, THYROIDAB in the last 72 hours.  Invalid  input(s): FREET3  TELE  Atrial fibrillation with controlled ventricular response  ECG    Radiology/Studies  Ct Abdomen Pelvis Wo Contrast  10/24/2014   CLINICAL DATA:  81 year old female with right flank/right upper quadrant pain for 2 days. Nausea and vomiting. Drain in place to drain fluid from lungs and abdomen. Subsequent encounter.  EXAM: CT ABDOMEN AND PELVIS WITHOUT CONTRAST  TECHNIQUE: Multidetector CT imaging of the abdomen and pelvis was performed following the standard protocol without IV contrast.  COMPARISON:  10/24/2014 plain film exam.  09/22/2014 CT.  FINDINGS: Inferior right chest tube is in place with decrease in size but incomplete clearance of right-sided hydro pneumothorax.  Very small left-sided pleural effusion with basilar  atelectasis.  Enlarged gallbladder contains small calcified stones with surrounding inflammation suggestive of acute cholecystitis.  Pneumobilia once again noted in the common bile duct which may be related to prior sphincterotomy. No calcified common bile duct stone visualized.  Post colectomy with surgical clips surrounding the cecum/right colon and colostomy left lower abdomen with large parastomal hernia containing small and large bowel. Previously stomach partially entered the hernia but has been reduced. No free intraperitoneal air.  Cardiomegaly. Mitral valve and aortic valve calcifications. Coronary artery calcifications.  Atherosclerotic type changes aorta with mild ectasia. Atherosclerotic type changes iliac arteries.  Right kidney rotated anteriorly. No renal or ureteral obstructing stone. Left upper pole 1.2 cm cyst. Calcification left hilum may be vascular in origin.  Taking into account limitation by non contrast imaging, no worrisome hepatic, splenic, pancreatic or adrenal lesion.  Post hysterectomy.  No urinary bladder abnormality noted.  Heterogeneous bone marrow as previously noted. Cannot exclude metastatic disease myeloma. Schmorl's node deformities. Spinal stenosis most notable L4-5.  Scattered normal to top-normal size lymph nodes.  IMPRESSION: Enlarged gallbladder contains small calcified stones with surrounding inflammation suggestive of acute cholecystitis.  Pneumobilia once again noted in the common bile duct which may be related to prior sphincterotomy. No calcified common bile duct stone visualized.  Colostomy left lower abdomen with large parastomal hernia containing small and large bowel.  Inferior right chest tube is in place with decrease in size but incomplete clearance of right-sided hydro pneumothorax.  Very small left-sided pleural effusion with basilar atelectasis.  Cardiomegaly. Mitral valve and aortic valve calcifications. Coronary artery calcifications.  Atherosclerotic type  changes aorta, aortic branch vessels and iliac arteries.  Heterogeneous bone marrow as previously noted. Cannot exclude metastatic disease or myeloma. Schmorl's node deformities. Spinal stenosis most notable L4-5.  These results were called by telephone at the time of interpretation on 10/24/2014 at 7:10 am to Dr. Mingo Amber , who verbally acknowledged these results.   Electronically Signed   By: Genia Del M.D.   On: 10/24/2014 07:22   Dg Chest 2 View  10/03/2014   CLINICAL DATA:  Right pleural drainage catheter inserted on September 12, 2014 4 multiloculated hydro pneumothorax ; experiencing some shortness of breath today  EXAM: CHEST  2 VIEW  COMPARISON:  Portable chest x-ray of September 27, 2014 and PA and lateral chest x-ray of September 12, 2014.  FINDINGS: There has been marked improvement in the appearance of the pulmonary interstitium bilaterally. The cardiac silhouette remains enlarged. The pulmonary vascularity is less engorged than on the previous study. On the right there are at least 2 small air-fluid levels anteriorly in the mid hemi thorax. There is an air-fluid level at the right lung base posteriorly. The small caliber chest tube has its tip projecting over the posterior aspect  of the right fourth rib. The bony thorax exhibits no acute abnormality.  IMPRESSION: There has been considerable interval improvement in the appearance of the chest since the previous study, but there remain loculated fluid collections in the anterior aspect of the mid right hemithorax and posteriorly in the inferior aspect of the thorax. The chest tube tip lies superiorly in the upper right pleural space posteriorly.   Electronically Signed   By: David  Martinique M.D.   On: 10/03/2014 12:37   Dg Chest Port 1 View  10/28/2014   CLINICAL DATA:  81 year old female with pleural effusion.  EXAM: PORTABLE CHEST - 1 VIEW  COMPARISON:  Chest x-ray 10/27/2014.  FINDINGS: Lung volumes are low. Extensive bibasilar opacities (right greater than left)  may reflect areas of atelectasis and/or consolidation. Small to moderate left and moderate right-sided pleural effusions. Previously noted pleural fluid in the apex of the right hemithorax has redistributed. Small bore right-sided chest tube again noted, tip of which appears to extend into the apex of the right hemithorax. No cephalization of the pulmonary vasculature. Heart size appears mildly enlarged. The patient is rotated to the left on today's exam, resulting in distortion of the mediastinal contours and reduced diagnostic sensitivity and specificity for mediastinal pathology. Atherosclerosis in the thoracic aorta.  IMPRESSION: 1. Decreased size and read distribution of moderate right pleural effusion. 2. Persistent low lung volumes with bibasilar areas of atelectasis and/or consolidation. 3. Mild cardiomegaly. 4. Atherosclerosis.   Electronically Signed   By: Vinnie Langton M.D.   On: 10/28/2014 08:55   Dg Chest Port 1 View  10/27/2014   CLINICAL DATA:  Pleural effusion.  EXAM: PORTABLE CHEST - 1 VIEW  COMPARISON:  10/24/2014 .  CT 08/30/2014.  FINDINGS: Right chest tube in stable position . No pneumothorax on today's exam. Cardiomegaly with bilateral pulmonary interstitial prominence and bilateral pleural effusions, right side greater than left noted. Right apical pleural thickening noted consistent with pleural effusion.  IMPRESSION: 1. Right chest tube in stable position. No pneumothorax noted on today's exam. 2. Congestive heart failure with bilateral pulmonary interstitial edema and bilateral pleural effusion. Right pleural effusion is prominent.   Electronically Signed   By: Marcello Moores  Register   On: 10/27/2014 07:39   Dg Abd Acute W/chest  10/24/2014   CLINICAL DATA:  RIGHT lower quadrant pain beginning 2 days ago, nausea and vomiting. Pleural effusion.  EXAM: DG ABDOMEN ACUTE W/ 1V CHEST  COMPARISON:  Chest radiograph October 03, 2014  FINDINGS: RIGHT pleural fat extending to the lung apex, with  decrease, smaller loose decidual pleural effusion. Cardiac silhouette is moderately enlarged unchanged. Calcified aortic knob. Similar chronic moderate bronchitic changes without focal consolidation. No pneumothorax. Soft tissue planes included osseous structure nonsuspicious, mild degenerative change of the thoracic spine.  Paucity of bowel gas. Surgical clips along LEFT abdomen associated with apparent large hernia, the lateral aspect not fully imaged. The hernia sac appears to contain multiple loops of bowel. No intra-abdominal mass effect or pathologic calcifications. No intraperitoneal free air on the decubitus view. Moderate degenerative change of the lumbar spine.  IMPRESSION: RIGHT chest tube, small residual RIGHT pleural effusion, improved.  Stable cardiomegaly and moderate chronic bronchitic changes.  Large suspected LEFT lateral abdominal wall hernia containing bowel without bowel obstruction.   Electronically Signed   By: Elon Alas M.D.   On: 10/24/2014 05:16    ASSESSMENT AND PLAN 1.  Severe aortic stenosis.  Medical therapy. 2.  Chronic atrial fibrillation.  Not on  long-term anticoagulation because of comorbidities and fall risk. 3.  Chronic renal insufficiency. 4.  History of congestive heart failure.  History of recurrent right pleural effusions requiring prior multiple thoracenteses.  Presently the patient appears to be euvolemic.  Plan: On restarting furosemide yet.  At discharge would consider lower dose of furosemide than she previously had been on.  Her blood pressure remains borderline low.   Signed, Darlin Coco MD

## 2014-10-30 NOTE — Progress Notes (Signed)
PROGRESS NOTE  [name] GDJ:[id number] DOB: [birth year] DOA: 10/24/2014 PCP: Abigail Miyamoto, MD  Brief history 81 year old female with history of stage D severe symptomatic aortic stenosis, stage IV CKD with nonfunctioning AV fistula, chronic right pleural effusion with an indwelling drainage catheter, IDDM/type II DM, chronic A. fib not an anticoagulation candidate because of fall risk, debilitation/weakness, chronic diastolic CHF, remote colon cancer history status post colostomy. The patient has acalculus cholecystitis status post percutaneous cholecystotomy tube February 2016, and was admitted on 10/24/2014 with recurrent cholecystitis. The patient underwent open cholecystectomy on 8/4 per Dr. Donne Hazel and was returned to ICU for observation. After 24 hours observation, the patient was transferred to the medical floor. Assessment/Plan: Acute cholecystitis.  -H/o cholecystitis, choledocholithiasis with recent Biliary drainage perc cholecystostomy tube (04/2014) and ERCP (06/2014) -CT abdomen: Enlarged gallbladder contains small calcified stones with surrounding inflammation suggestive of acute cholecystitis -Open cholecystectomy 10/26/14 -Appreciate cardiology input  -tolerating heart healthy diet -PT evaluation--->SNF  CHF recurrent pleural effusion s/p Pleurex, Severe Aortic Stenosis. Echo (08/2014). LVEF: 65-70%. Severe aortic stenosis with Mean gradient (S): 34 mm Hg. -Does not appear to be overtly decompensated -We'll need more accurate I's and O's -Daily weights--discussed with RN--patient has had poor oral intake since surgery -Patient was given furosemide 60 mg IV 1 10/26/2014 -Decision to resume furosemide per cardiology/nephrology -Patient normally has right-sided Pleurx drained once per week on Tues. -repeat CXR in am 8/9  Acute on chronic renal failure Stage IV chronic kidney disease - Baseline creatinine 2.3-2.6 -Secondary to hemodynamic derangements in  setting of recent acute cholecystitis -Creatinine appeared to be plateauing, but serum creatinine continues to rise -consulted nephrology--discussed with Dr. Garen Lah a candidate for HD -holding furosemide presently -some improvement in renal function with 500cc IVF given on 10/29/14  Type II DM/IDDM - Held oral hypoglycemics. Continue SSI. - Controlled. -09/22/2014 hemoglobin A1c 6.5  Severe symptomatic aortic stenosis - medical therapy only -not a good candidate for TAVR or any surgical intervention  Chronic/recurrent right pleural effusion, s/p Pleurx catheter placement - Continue to drain Pleurx catheter once per week - 10/28/2014 chest x-ray revealed slightly improved but reduced due to right-sided moderate pleural effusion -repeat CXR 8/9  Chronic A. Fib -Rate controlled -Continue carvedilol  -not AC candidate due to falls -CHADS-VASc= 5 -restart ASA 81 mg daily  Anemia - Hemoglobin has slightly dropped. Stable -Baseline hemoglobin approximately 9  Leukocytosis -Remains afebrile and hemodynamically stable -Discontinue antibiotics -Monitor closely for now -No significant pyuria to suggest UTI -WBC improved  Mild thrombocytopenia - Probably due to acute infection. Monitor -No signs of active bleeding presently -This has been chronic dating back to September 2013  Hx of colon cancer -s/p R-hemicolectomy 2002 with ostomy  Family Communication: Daughter [name] updated on phone 10/30/14 Disposition Plan: SNF 10/31/14 if cleared by renal and cardiology             Procedures/Studies: Ct Abdomen Pelvis Wo Contrast  10/24/2014   CLINICAL DATA:  81 year old female with right flank/right upper quadrant pain for 2 days. Nausea and vomiting. Drain in place to drain fluid from lungs and abdomen. Subsequent encounter.  EXAM: CT ABDOMEN AND PELVIS WITHOUT CONTRAST  TECHNIQUE: Multidetector CT imaging of the abdomen and pelvis was performed following the  standard protocol without IV contrast.  COMPARISON:  10/24/2014 plain film exam.  09/22/2014 CT.  FINDINGS: Inferior right chest tube is in place with decrease in size but incomplete clearance  of right-sided hydro pneumothorax.  Very small left-sided pleural effusion with basilar atelectasis.  Enlarged gallbladder contains small calcified stones with surrounding inflammation suggestive of acute cholecystitis.  Pneumobilia once again noted in the common bile duct which may be related to prior sphincterotomy. No calcified common bile duct stone visualized.  Post colectomy with surgical clips surrounding the cecum/right colon and colostomy left lower abdomen with large parastomal hernia containing small and large bowel. Previously stomach partially entered the hernia but has been reduced. No free intraperitoneal air.  Cardiomegaly. Mitral valve and aortic valve calcifications. Coronary artery calcifications.  Atherosclerotic type changes aorta with mild ectasia. Atherosclerotic type changes iliac arteries.  Right kidney rotated anteriorly. No renal or ureteral obstructing stone. Left upper pole 1.2 cm cyst. Calcification left hilum may be vascular in origin.  Taking into account limitation by non contrast imaging, no worrisome hepatic, splenic, pancreatic or adrenal lesion.  Post hysterectomy.  No urinary bladder abnormality noted.  Heterogeneous bone marrow as previously noted. Cannot exclude metastatic disease myeloma. Schmorl's node deformities. Spinal stenosis most notable L4-5.  Scattered normal to top-normal size lymph nodes.  IMPRESSION: Enlarged gallbladder contains small calcified stones with surrounding inflammation suggestive of acute cholecystitis.  Pneumobilia once again noted in the common bile duct which may be related to prior sphincterotomy. No calcified common bile duct stone visualized.  Colostomy left lower abdomen with large parastomal hernia containing small and large bowel.  Inferior right chest  tube is in place with decrease in size but incomplete clearance of right-sided hydro pneumothorax.  Very small left-sided pleural effusion with basilar atelectasis.  Cardiomegaly. Mitral valve and aortic valve calcifications. Coronary artery calcifications.  Atherosclerotic type changes aorta, aortic branch vessels and iliac arteries.  Heterogeneous bone marrow as previously noted. Cannot exclude metastatic disease or myeloma. Schmorl's node deformities. Spinal stenosis most notable L4-5.  These results were called by telephone at the time of interpretation on 10/24/2014 at 7:10 am to Dr. Mingo Amber , who verbally acknowledged these results.   Electronically Signed   By: Genia Del M.D.   On: 10/24/2014 07:22   Dg Chest 2 View  10/03/2014   CLINICAL DATA:  Right pleural drainage catheter inserted on September 12, 2014 4 multiloculated hydro pneumothorax ; experiencing some shortness of breath today  EXAM: CHEST  2 VIEW  COMPARISON:  Portable chest x-ray of September 27, 2014 and PA and lateral chest x-ray of September 12, 2014.  FINDINGS: There has been marked improvement in the appearance of the pulmonary interstitium bilaterally. The cardiac silhouette remains enlarged. The pulmonary vascularity is less engorged than on the previous study. On the right there are at least 2 small air-fluid levels anteriorly in the mid hemi thorax. There is an air-fluid level at the right lung base posteriorly. The small caliber chest tube has its tip projecting over the posterior aspect of the right fourth rib. The bony thorax exhibits no acute abnormality.  IMPRESSION: There has been considerable interval improvement in the appearance of the chest since the previous study, but there remain loculated fluid collections in the anterior aspect of the mid right hemithorax and posteriorly in the inferior aspect of the thorax. The chest tube tip lies superiorly in the upper right pleural space posteriorly.   Electronically Signed   By: Evin Loiseau  [place]  M.D.   On: 10/03/2014 12:37   Dg Chest Port 1 View  10/28/2014   CLINICAL DATA:  81 year old female with pleural effusion.  EXAM: PORTABLE CHEST - 1 VIEW  COMPARISON:  Chest x-ray 10/27/2014.  FINDINGS: Lung volumes are low. Extensive bibasilar opacities (right greater than left) may reflect areas of atelectasis and/or consolidation. Small to moderate left and moderate right-sided pleural effusions. Previously noted pleural fluid in the apex of the right hemithorax has redistributed. Small bore right-sided chest tube again noted, tip of which appears to extend into the apex of the right hemithorax. No cephalization of the pulmonary vasculature. Heart size appears mildly enlarged. The patient is rotated to the left on today's exam, resulting in distortion of the mediastinal contours and reduced diagnostic sensitivity and specificity for mediastinal pathology. Atherosclerosis in the thoracic aorta.  IMPRESSION: 1. Decreased size and read distribution of moderate right pleural effusion. 2. Persistent low lung volumes with bibasilar areas of atelectasis and/or consolidation. 3. Mild cardiomegaly. 4. Atherosclerosis.   Electronically Signed   By: Vinnie Langton M.D.   On: 10/28/2014 08:55   Dg Chest Port 1 View  10/27/2014   CLINICAL DATA:  Pleural effusion.  EXAM: PORTABLE CHEST - 1 VIEW  COMPARISON:  10/24/2014 .  CT 08/30/2014.  FINDINGS: Right chest tube in stable position . No pneumothorax on today's exam. Cardiomegaly with bilateral pulmonary interstitial prominence and bilateral pleural effusions, right side greater than left noted. Right apical pleural thickening noted consistent with pleural effusion.  IMPRESSION: 1. Right chest tube in stable position. No pneumothorax noted on today's exam. 2. Congestive heart failure with bilateral pulmonary interstitial edema and bilateral pleural effusion. Right pleural effusion is prominent.   Electronically Signed   By: Marcello Moores  Register   On: 10/27/2014 07:39   Dg  Abd Acute W/chest  10/24/2014   CLINICAL DATA:  RIGHT lower quadrant pain beginning 2 days ago, nausea and vomiting. Pleural effusion.  EXAM: DG ABDOMEN ACUTE W/ 1V CHEST  COMPARISON:  Chest radiograph October 03, 2014  FINDINGS: RIGHT pleural fat extending to the lung apex, with decrease, smaller loose decidual pleural effusion. Cardiac silhouette is moderately enlarged unchanged. Calcified aortic knob. Similar chronic moderate bronchitic changes without focal consolidation. No pneumothorax. Soft tissue planes included osseous structure nonsuspicious, mild degenerative change of the thoracic spine.  Paucity of bowel gas. Surgical clips along LEFT abdomen associated with apparent large hernia, the lateral aspect not fully imaged. The hernia sac appears to contain multiple loops of bowel. No intra-abdominal mass effect or pathologic calcifications. No intraperitoneal free air on the decubitus view. Moderate degenerative change of the lumbar spine.  IMPRESSION: RIGHT chest tube, small residual RIGHT pleural effusion, improved.  Stable cardiomegaly and moderate chronic bronchitic changes.  Large suspected LEFT lateral abdominal wall hernia containing bowel without bowel obstruction.   Electronically Signed   By: Elon Alas M.D.   On: 10/24/2014 05:16         Subjective: Patient denies fevers, chills, headache, chest pain, dyspnea, nausea, vomiting, diarrhea, abdominal pain, dysuria, hematuria   Objective: Filed Vitals:   10/29/14 1529 10/29/14 2144 10/30/14 0546 10/30/14 1318  BP: 105/50 107/42 141/56 116/65  Pulse: 65 67 69 64  Temp: 98.3 F (36.8 C) 97.7 F (36.5 C) 97.9 F (36.6 C) 98.2 F (36.8 C)  TempSrc: Oral Oral Oral Oral  Resp: 18 18 16 20   Height:      Weight:   83.32 kg (183 lb 11 oz)   SpO2: 93% 92% 100% 100%    Intake/Output Summary (Last 24 hours) at 10/30/14 1846 Last data filed at 10/30/14 0855  Gross per 24 hour  Intake  472.5 ml  Output  450 ml  Net   22.5 ml     Weight change: 0.02 kg (0.7 oz) Exam:   General:  Pt is alert, follows commands appropriately, not in acute distress  HEENT: No icterus, No thrush, No neck mass, [place]/AT  Cardiovascular: RRR, S1/S2, no rubs, no gallops  Respiratory: Bibasilar crackles. Diminished breath sounds right base. No wheezing.  Abdomen: Soft/+BS,RUQ pain without rebound, non distended, no guarding  Extremities: trace LE edema, No lymphangitis, No petechiae, No rashes, no synovitis  Data Reviewed: Basic Metabolic Panel:  Recent Labs Lab 10/26/14 0350 10/27/14 0220 10/28/14 0454 10/29/14 0550 10/30/14 0700  NA 138 140 139 133* 131*  K 4.8 4.3 4.3 4.2 4.4  CL 100* 102 104 98* 99*  CO2 26 21* 23 23 22   GLUCOSE 84 152* 111* 106* 133*  BUN 58* 63* 73* 74* 79*  CREATININE 3.85* 4.06* 4.22* 4.51* 4.23*  CALCIUM 8.2* 8.3* 8.3* 8.1* 7.9*  PHOS  --  7.9*  --   --  5.5*   Liver Function Tests:  Recent Labs Lab 10/24/14 0500 10/26/14 0350 10/27/14 0220 10/30/14 0700  AST 17 17 19   --   ALT 8* 6* 8*  --   ALKPHOS 117 82 79  --   BILITOT 0.8 1.3* 1.4*  --   PROT 6.0* 5.5* 5.5*  --   ALBUMIN 2.7* 2.1* 2.0* 1.9*    Recent Labs Lab 10/24/14 0500  LIPASE 58*   No results for input(s): AMMONIA in the last 168 hours. CBC:  Recent Labs Lab 10/24/14 0500 10/25/14 0445 10/26/14 0350 10/27/14 0500 10/29/14 0550  WBC 11.8* 9.9 9.7 15.3* 9.3  NEUTROABS 9.7*  --   --   --   --   HGB 11.8* 10.2* 10.2* 10.4* 9.1*  HCT 38.7 34.4* 34.2* 34.1* 30.3*  MCV 94.6 97.5 95.8 95.5 93.8  PLT 141* 138* 141* 146* 137*   Cardiac Enzymes: No results for input(s): CKTOTAL, CKMB, CKMBINDEX, TROPONINI in the last 168 hours. BNP: Invalid input(s): POCBNP CBG:  Recent Labs Lab 10/30/14 0003 10/30/14 0428 10/30/14 0751 10/30/14 1208 10/30/14 1647  GLUCAP 156* 148* 128* 172* 183*    Recent Results (from the past 240 hour(s))  Urine culture     Status: None   Collection Time: 10/24/14  6:03 AM   Result Value Ref Range Status   Specimen Description URINE, RANDOM  Final   Special Requests ADDED [id number] [phone]  Final   Culture >=100,000 COLONIES/mL ESCHERICHIA COLI  Final   Report Status 10/26/2014 FINAL  Final   Organism ID, Bacteria ESCHERICHIA COLI  Final      Susceptibility   Escherichia coli - MIC*    AMPICILLIN <=2 SENSITIVE Sensitive     CEFAZOLIN <=4 SENSITIVE Sensitive     CEFTRIAXONE <=1 SENSITIVE Sensitive     CIPROFLOXACIN <=0.25 SENSITIVE Sensitive     GENTAMICIN <=1 SENSITIVE Sensitive     IMIPENEM <=0.25 SENSITIVE Sensitive     NITROFURANTOIN <=16 SENSITIVE Sensitive     TRIMETH/SULFA <=20 SENSITIVE Sensitive     AMPICILLIN/SULBACTAM <=2 SENSITIVE Sensitive     PIP/TAZO <=4 SENSITIVE Sensitive     * >=100,000 COLONIES/mL ESCHERICHIA COLI  Surgical pcr screen     Status: None   Collection Time: 10/26/14  5:33 AM  Result Value Ref Range Status   MRSA, PCR NEGATIVE NEGATIVE Final   Staphylococcus aureus NEGATIVE NEGATIVE Final    Comment:        The Xpert SA Assay (FDA approved  for NASAL specimens in patients over 65 years of age), is one component of a comprehensive surveillance program.  Test performance has been validated by [facility] for patients greater than or equal to 84 year old. It is not intended to diagnose infection nor to guide or monitor treatment.      Scheduled Meds: . aspirin EC  81 mg Oral QHS  . calcitRIOL  0.25 mcg Oral Daily  . carvedilol  6.25 mg Oral BID  . darbepoetin (ARANESP) injection - NON-DIALYSIS  100 mcg Subcutaneous Q Mon-1800  . heparin  5,000 Units Subcutaneous 3 times per day  . insulin aspart  0-9 Units Subcutaneous TID WC  . LORazepam  0.5 mg Oral BID  . mirtazapine  7.5 mg Oral QHS  . saccharomyces boulardii  250 mg Oral BID  . simvastatin  20 mg Oral q1800  . sodium chloride  3 mL Intravenous Q12H   Continuous Infusions:    Ason Heslin, DO  [facility] Pager [phone]  If 7PM-7AM, please contact  night-coverage [email or web address] Password [facility] 10/30/2014, 6:46 PM   LOS: 6 days

## 2014-10-30 NOTE — Progress Notes (Signed)
Physical Therapy Treatment Patient Details Name: [name] MRN: [id number] DOB: April 09, 1925 Today's Date: 10/30/2014    History of Present Illness 81 y.o. female admitted to [facility] on 10/24/14 for nausea and RUQ pain.  Dx with acute cholecystitis and underwent cholecystectomy on 10/26/14.  Pt with significant PMHx of CKD IV, anemia, chronic A-fib, chronic diastolic CHF, HTN, severe aortic stenosis, depression/anxiety, recurrent pleural effusion s/p plurex catheter placement R, cholecystitis, DM, colectomy with L side ostomy due to CA, and R leg fx surgery.    PT Comments    Patient found seated in recliner, agreeable to participate in PT today. She was able to transfer as described below. Patient is impulsive with transfers and requires supervision for safety. She was able to ambulate as described below with chair follow. Patient will benefit from PT to address endurance deficits and gait deviations.  Follow Up Recommendations  SNF     Equipment Recommendations  None recommended by PT    Recommendations for Other Services       Precautions / Restrictions Precautions Precautions: Fall Restrictions Weight Bearing Restrictions: No    Mobility  Bed Mobility               General bed mobility comments: Patient found seated in recliner.  Transfers Overall transfer level: Needs assistance Equipment used: Rolling walker (2 wheeled) Transfers: Sit to/from Stand Sit to Stand: Min guard         General transfer comment: Patient requires safety cues to use RW and stand with supervision, and was able to stand on her own on the second attempt from the bedside commode.  Ambulation/Gait Ambulation/Gait assistance: Min assist;+2 safety/equipment Ambulation Distance (Feet): 40 Feet Assistive device: Rolling walker (2 wheeled) Gait Pattern/deviations: Shuffle;Step-through pattern;Trunk flexed;Narrow base of support   Gait velocity interpretation: <1.8 ft/sec, indicative of risk for  recurrent falls General Gait Details: Patient requires mod verbal cues to keep feet inside support of RW. Needed one standing rest break. No LOB noted.   Stairs            Wheelchair Mobility    Modified Rankin (Stroke Patients Only)       Balance Overall balance assessment: Needs assistance Sitting-balance support: Feet supported;Bilateral upper extremity supported Sitting balance-Leahy Scale: Fair     Standing balance support: Bilateral upper extremity supported Standing balance-Leahy Scale: Poor Standing balance comment: Requires B UE support for all standing balance.                    Cognition Arousal/Alertness: Awake/alert Behavior During Therapy: WFL for tasks assessed/performed Overall Cognitive Status: Impaired/Different from baseline Area of Impairment: Safety/judgement         Safety/Judgement: Decreased awareness of safety;Decreased awareness of deficits (Very impulsive, slightly dismissive of help.)          Exercises      General Comments        Pertinent Vitals/Pain Pain Assessment: Faces Faces Pain Scale: Hurts even more Pain Location: abdominal incision when sitting after ambulation Pain Descriptors / Indicators: Grimacing;Operative site guarding Pain Intervention(s): Monitored during session    Home Living                      Prior Function            PT Goals (current goals can now be found in the care plan section) Acute Rehab PT Goals Patient Stated Goal: to go home ASAP PT Goal Formulation: With patient Time  For Goal Achievement: 11/11/14 Potential to Achieve Goals: Good Progress towards PT goals: Progressing toward goals    Frequency  Min 3X/week    PT Plan Current plan remains appropriate    Co-evaluation             End of Session Equipment Utilized During Treatment: Gait belt (Placed cranially to incision. ) Activity Tolerance: Patient limited by fatigue Patient left: in chair;with call  bell/phone within reach;with chair alarm set     Time: 6160-7371 PT Time Calculation (min) (ACUTE ONLY): 22 min  Charges:  $Gait Training: 8-22 mins                    G CodesRoanna Epley, SPT [phone]  10/30/2014, 5:28 PM

## 2014-10-30 NOTE — Progress Notes (Signed)
Patient ID: [name], female   DOB: 1925/11/13, 81 y.o.   MRN: [id number] 4 Days Post-Op  Subjective: Pt feels ok.  Tolerating a solid diet.  Pain with movement.  Has not gotten up to walk yet, just to sit in a chair.  +BM  Objective: Vital signs in last 24 hours: Temp:  [97.7 F (36.5 C)-98.3 F (36.8 C)] 97.9 F (36.6 C) (08/08 0546) Pulse Rate:  [65-69] 69 (08/08 0546) Resp:  [16-18] 16 (08/08 0546) BP: (105-141)/(42-56) 141/56 mmHg (08/08 0546) SpO2:  [92 %-100 %] 100 % (08/08 0546) Weight:  [83.32 kg (183 lb 11 oz)] 83.32 kg (183 lb 11 oz) (08/08 0546) Last BM Date: 10/28/14  Intake/Output from previous day: 08/07 0701 - 08/08 0700 In: 692.5 [P.O.:220; I.V.:472.5] Out: 400 [Urine:400] Intake/Output this shift:    PE: Abd: soft, appropriately tender, +BS, ND, incision c/d/i with staples present  Lab Results:   Recent Labs  10/29/14 0550  WBC 9.3  HGB 9.1*  HCT 30.3*  PLT 137*   BMET  Recent Labs  10/29/14 0550 10/30/14 0700  NA 133* 131*  K 4.2 4.4  CL 98* 99*  CO2 23 22  GLUCOSE 106* 133*  BUN 74* 79*  CREATININE 4.51* 4.23*  CALCIUM 8.1* 7.9*   PT/INR No results for input(s): LABPROT, INR in the last 72 hours. CMP     Component Value Date/Time   NA 131* 10/30/2014 0700   K 4.4 10/30/2014 0700   CL 99* 10/30/2014 0700   CO2 22 10/30/2014 0700   GLUCOSE 133* 10/30/2014 0700   BUN 79* 10/30/2014 0700   CREATININE 4.23* 10/30/2014 0700   CALCIUM 7.9* 10/30/2014 0700   CALCIUM 8.9 10/20/2014 1526   PROT 5.5* 10/27/2014 0220   ALBUMIN 1.9* 10/30/2014 0700   AST 19 10/27/2014 0220   ALT 8* 10/27/2014 0220   ALKPHOS 79 10/27/2014 0220   BILITOT 1.4* 10/27/2014 0220   GFRNONAA 9* 10/30/2014 0700   GFRAA 10* 10/30/2014 0700   Lipase     Component Value Date/Time   LIPASE 58* 10/24/2014 0500       Studies/Results: No results found.  Anti-infectives: Anti-infectives    Start     Dose/Rate Route Frequency Ordered Stop   10/27/14  1100  cefTRIAXone (ROCEPHIN) 1 g in dextrose 5 % 50 mL IVPB  Status:  Discontinued     1 g 100 mL/hr over 30 Minutes Intravenous Every 24 hours 10/27/14 1012 10/28/14 1004   10/24/14 1100  piperacillin-tazobactam (ZOSYN) IVPB 2.25 g  Status:  Discontinued     2.25 g 100 mL/hr over 30 Minutes Intravenous 3 times per day 10/24/14 1035 10/27/14 1012   10/24/14 0730  piperacillin-tazobactam (ZOSYN) IVPB 3.375 g     3.375 g 100 mL/hr over 30 Minutes Intravenous  Once 10/24/14 0718 10/24/14 0832       Assessment/Plan  1. POD 4, s/p open chole -doing well -tolerating a solid diet -would DC staples around POD 10 given she is older -pulm toilet and mobilize, agree with PT -patient is surgically stable for dc to SNF/home when medically stable.   LOS: 6 days    Samya Siciliano E 10/30/2014, 8:33 AM Pager: [phone]

## 2014-10-30 NOTE — Progress Notes (Signed)
S: SOme incisional pain.  Appetite marginal O:BP 141/56 mmHg  Pulse 69  Temp(Src) 97.9 F (36.6 C) (Oral)  Resp 16  Ht 5\' 3"  (1.6 m)  Wt 83.32 kg (183 lb 11 oz)  BMI 32.55 kg/m2  SpO2 100%  Intake/Output Summary (Last 24 hours) at 10/30/14 1159 Last data filed at 10/30/14 0855  Gross per 24 hour  Intake  472.5 ml  Output    650 ml  Net -177.5 ml   Weight change: 0.02 kg (0.7 oz) KPT:WSFKC and alert LEX:NTZGY,FVCBS 3/6 syatolic m LSB Resp: Bilat crackles Rt>>Lt Abd:+ BS ND  Mild tenderness around incision.  + Ostomy bag Ext: trace edema NEURO: CNI Ox3 no asterixis   . aspirin EC  81 mg Oral QHS  . calcitRIOL  0.25 mcg Oral Daily  . carvedilol  6.25 mg Oral BID  . heparin  5,000 Units Subcutaneous 3 times per day  . insulin aspart  0-9 Units Subcutaneous TID WC  . LORazepam  0.5 mg Oral BID  . mirtazapine  7.5 mg Oral QHS  . saccharomyces boulardii  250 mg Oral BID  . simvastatin  20 mg Oral q1800  . sodium chloride  3 mL Intravenous Q12H   No results found. BMET    Component Value Date/Time   NA 131* 10/30/2014 0700   K 4.4 10/30/2014 0700   CL 99* 10/30/2014 0700   CO2 22 10/30/2014 0700   GLUCOSE 133* 10/30/2014 0700   BUN 79* 10/30/2014 0700   CREATININE 4.23* 10/30/2014 0700   CALCIUM 7.9* 10/30/2014 0700   CALCIUM 8.9 10/20/2014 1526   GFRNONAA 9* 10/30/2014 0700   GFRAA 10* 10/30/2014 0700   CBC    Component Value Date/Time   WBC 9.3 10/29/2014 0550   RBC 3.23* 10/29/2014 0550   RBC 3.54* 10/14/2009 0043   HGB 9.1* 10/29/2014 0550   HCT 30.3* 10/29/2014 0550   PLT 137* 10/29/2014 0550   MCV 93.8 10/29/2014 0550   MCH 28.2 10/29/2014 0550   MCHC 30.0 10/29/2014 0550   RDW 16.1* 10/29/2014 0550   LYMPHSABS 1.2 10/24/2014 0500   MONOABS 0.9 10/24/2014 0500   EOSABS 0.0 10/24/2014 0500   BASOSABS 0.0 10/24/2014 0500     Assessment:  1. Acute on CKD (baseline 3), Scr stable though UO marginal.  Plan ids to treat ARF medically without HD 2.  SP cholecystectomy 3. Sec HPTH 4. Anemia on outpt ESA   Plan: 1. Recheck Scr in AM 2. Will give dose of aranesp   Tareka Jhaveri T

## 2014-10-31 ENCOUNTER — Ambulatory Visit: Payer: Medicare Other | Admitting: Thoracic Surgery (Cardiothoracic Vascular Surgery)

## 2014-10-31 ENCOUNTER — Inpatient Hospital Stay (HOSPITAL_COMMUNITY): Payer: Medicare Other

## 2014-10-31 DIAGNOSIS — E118 Type 2 diabetes mellitus with unspecified complications: Secondary | ICD-10-CM

## 2014-10-31 LAB — RENAL FUNCTION PANEL
ANION GAP: 10 (ref 5–15)
Albumin: 2 g/dL — ABNORMAL LOW (ref 3.5–5.0)
BUN: 73 mg/dL — AB (ref 6–20)
CHLORIDE: 101 mmol/L (ref 101–111)
CO2: 23 mmol/L (ref 22–32)
CREATININE: 3.8 mg/dL — AB (ref 0.44–1.00)
Calcium: 8.2 mg/dL — ABNORMAL LOW (ref 8.9–10.3)
GFR calc non Af Amer: 10 mL/min — ABNORMAL LOW (ref >60)
GFR, EST AFRICAN AMERICAN: 11 mL/min — AB (ref >60)
Glucose, Bld: 135 mg/dL — ABNORMAL HIGH (ref 65–99)
PHOSPHORUS: 4.2 mg/dL (ref 2.5–4.6)
Potassium: 4.4 mmol/L (ref 3.5–5.1)
Sodium: 134 mmol/L — ABNORMAL LOW (ref 135–145)

## 2014-10-31 LAB — GLUCOSE, CAPILLARY
GLUCOSE-CAPILLARY: 160 mg/dL — AB (ref 65–99)
GLUCOSE-CAPILLARY: 120 mg/dL — AB (ref 65–99)
Glucose-Capillary: 133 mg/dL — ABNORMAL HIGH (ref 65–99)

## 2014-10-31 MED ORDER — DOCUSATE SODIUM 100 MG PO CAPS
100.0000 mg | ORAL_CAPSULE | Freq: Two times a day (BID) | ORAL | Status: DC
  Administered 2014-10-31: 100 mg via ORAL
  Filled 2014-10-31 (×3): qty 1

## 2014-10-31 MED ORDER — FUROSEMIDE 80 MG PO TABS
80.0000 mg | ORAL_TABLET | Freq: Two times a day (BID) | ORAL | Status: DC
  Administered 2014-10-31 – 2014-11-01 (×3): 80 mg via ORAL
  Filled 2014-10-31 (×3): qty 1

## 2014-10-31 MED ORDER — POLYETHYLENE GLYCOL 3350 17 G PO PACK
17.0000 g | PACK | Freq: Every day | ORAL | Status: DC
  Filled 2014-10-31: qty 1

## 2014-10-31 MED ORDER — BISACODYL 10 MG RE SUPP: 10.0000 mg | Freq: Every day | RECTAL | Status: DC | PRN

## 2014-10-31 NOTE — Progress Notes (Signed)
PROGRESS NOTE  [name] BPZ:[id number] DOB: 03-23-1926 DOA: 10/24/2014 PCP: Abigail Miyamoto, MD   Brief history 81 year old female with history of stage D severe symptomatic aortic stenosis, stage IV CKD with nonfunctioning AV fistula, chronic right pleural effusion with an indwelling drainage catheter, IDDM/type II DM, chronic A. fib not an anticoagulation candidate because of fall risk, debilitation/weakness, chronic diastolic CHF, remote colon cancer history status post colostomy. The patient has acalculus cholecystitis status post percutaneous cholecystotomy tube February 2016, and was admitted on 10/24/2014 with recurrent cholecystitis. The patient underwent open cholecystectomy on 8/4 per Dr. Donne Hazel and was returned to ICU for observation. After 24 hours observation, the patient was transferred to the medical floor. Assessment/Plan: Acute cholecystitis.  -H/o cholecystitis, choledocholithiasis with recent Biliary drainage perc cholecystostomy tube (04/2014) and ERCP (06/2014) -CT abdomen: Enlarged gallbladder contains small calcified stones with surrounding inflammation suggestive of acute cholecystitis -Open cholecystectomy 10/26/14 -Appreciate general surger -tolerating heart healthy diet -PT evaluation--->SNF  CHF recurrent pleural effusion s/p Pleurex, Severe Aortic Stenosis. Echo (08/2014). LVEF: 65-70%. Severe aortic stenosis with Mean gradient (S): 34 mm Hg. -Does not appear to be overtly decompensated -Daily weights--question accuracy--gained 8 pounds in past 24 hr -Patient was given furosemide 60 mg IV 1 10/26/2014 -Patient normally has right-sided Pleurx drained once per week on Tues.--per family doesn't drain much -repeat CXR in am 8/9--pulmonary edema -10/31/14 furosemide restarted by renal (80mg  bid)  Acute on chronic renal failure Stage IV chronic kidney disease - Baseline creatinine 2.3-2.6 -Secondary to hemodynamic derangements in setting of recent  acute cholecystitis -Creatinine appeared to be plateauing, but serum creatinine continues to rise -consulted nephrology--discussed with Dr. Moshe Cipro 8/7--not a candidate for HD -10/31/14 furosemide restarted by renal (80mg  bid) -some improvement in renal function with 500cc IVF given on 10/29/14  Type II DM/IDDM - Held oral hypoglycemics. Continue SSI. - Controlled. -09/22/2014 hemoglobin A1c 6.5  Severe symptomatic aortic stenosis - medical therapy only -not a good candidate for TAVR or any surgical intervention  Chronic/recurrent right pleural effusion, s/p Pleurx catheter placement - Continue to drain Pleurx catheter once per week - 10/28/2014 chest x-ray revealed slightly improved but reduced due to right-sided moderate pleural effusion  Chronic A. Fib -Rate controlled -Continue carvedilol  -not AC candidate due to falls -CHADS-VASc= 5 -restart ASA 81 mg daily  Anemia - Hemoglobin has slightly dropped. Stable -Baseline hemoglobin approximately 9  Leukocytosis -Remains afebrile and hemodynamically stable -Discontinue antibiotics -Monitor closely for now -No significant pyuria to suggest UTI -WBC improved  Mild thrombocytopenia - Probably due to acute infection. Monitor -No signs of active bleeding presently -This has been chronic dating back to September 2013  Hx of colon cancer -s/p R-hemicolectomy 2002 with ostomy  Family Communication: Daughter [name] updated on phone 10/30/14 Disposition Plan: SNF 11/01/14 if cleared by renal and cardiology       Procedures/Studies: Ct Abdomen Pelvis Wo Contrast  10/24/2014   CLINICAL DATA:  81 year old female with right flank/right upper quadrant pain for 2 days. Nausea and vomiting. Drain in place to drain fluid from lungs and abdomen. Subsequent encounter.  EXAM: CT ABDOMEN AND PELVIS WITHOUT CONTRAST  TECHNIQUE: Multidetector CT imaging of the abdomen and pelvis was performed following the standard protocol without IV  contrast.  COMPARISON:  10/24/2014 plain film exam.  09/22/2014 CT.  FINDINGS: Inferior right chest tube is in place with decrease in size but incomplete clearance of right-sided hydro pneumothorax.  Very small  left-sided pleural effusion with basilar atelectasis.  Enlarged gallbladder contains small calcified stones with surrounding inflammation suggestive of acute cholecystitis.  Pneumobilia once again noted in the common bile duct which may be related to prior sphincterotomy. No calcified common bile duct stone visualized.  Post colectomy with surgical clips surrounding the cecum/right colon and colostomy left lower abdomen with large parastomal hernia containing small and large bowel. Previously stomach partially entered the hernia but has been reduced. No free intraperitoneal air.  Cardiomegaly. Mitral valve and aortic valve calcifications. Coronary artery calcifications.  Atherosclerotic type changes aorta with mild ectasia. Atherosclerotic type changes iliac arteries.  Right kidney rotated anteriorly. No renal or ureteral obstructing stone. Left upper pole 1.2 cm cyst. Calcification left hilum may be vascular in origin.  Taking into account limitation by non contrast imaging, no worrisome hepatic, splenic, pancreatic or adrenal lesion.  Post hysterectomy.  No urinary bladder abnormality noted.  Heterogeneous bone marrow as previously noted. Cannot exclude metastatic disease myeloma. Schmorl's node deformities. Spinal stenosis most notable L4-5.  Scattered normal to top-normal size lymph nodes.  IMPRESSION: Enlarged gallbladder contains small calcified stones with surrounding inflammation suggestive of acute cholecystitis.  Pneumobilia once again noted in the common bile duct which may be related to prior sphincterotomy. No calcified common bile duct stone visualized.  Colostomy left lower abdomen with large parastomal hernia containing small and large bowel.  Inferior right chest tube is in place with  decrease in size but incomplete clearance of right-sided hydro pneumothorax.  Very small left-sided pleural effusion with basilar atelectasis.  Cardiomegaly. Mitral valve and aortic valve calcifications. Coronary artery calcifications.  Atherosclerotic type changes aorta, aortic branch vessels and iliac arteries.  Heterogeneous bone marrow as previously noted. Cannot exclude metastatic disease or myeloma. Schmorl's node deformities. Spinal stenosis most notable L4-5.  These results were called by telephone at the time of interpretation on 10/24/2014 at 7:10 am to Dr. Mingo Amber , who verbally acknowledged these results.   Electronically Signed   By: Genia Del M.D.   On: 10/24/2014 07:22   Dg Chest 2 View  10/03/2014   CLINICAL DATA:  Right pleural drainage catheter inserted on September 12, 2014 4 multiloculated hydro pneumothorax ; experiencing some shortness of breath today  EXAM: CHEST  2 VIEW  COMPARISON:  Portable chest x-ray of September 27, 2014 and PA and lateral chest x-ray of September 12, 2014.  FINDINGS: There has been marked improvement in the appearance of the pulmonary interstitium bilaterally. The cardiac silhouette remains enlarged. The pulmonary vascularity is less engorged than on the previous study. On the right there are at least 2 small air-fluid levels anteriorly in the mid hemi thorax. There is an air-fluid level at the right lung base posteriorly. The small caliber chest tube has its tip projecting over the posterior aspect of the right fourth rib. The bony thorax exhibits no acute abnormality.  IMPRESSION: There has been considerable interval improvement in the appearance of the chest since the previous study, but there remain loculated fluid collections in the anterior aspect of the mid right hemithorax and posteriorly in the inferior aspect of the thorax. The chest tube tip lies superiorly in the upper right pleural space posteriorly.   Electronically Signed   By: Lurleen Soltero  [place] M.D.   On: 10/03/2014  12:37   Dg Chest Port 1 View  10/31/2014   CLINICAL DATA:  Right pleural effusion.  EXAM: PORTABLE CHEST - 1 VIEW  COMPARISON:  10/30/2014.  CT 08/30/2014.  FINDINGS:  Right chest tube in stable position. No pneumothorax. Mediastinum and hilar structures are normal. Cardiomegaly with diffuse bilateral from interstitial prominence and bilateral effusions, right side greater than left. Findings suggest congestive heart failure. Interstitial pneumonitis cannot be excluded.  IMPRESSION: 1. Right chest tube in stable position.  No pneumothorax. 2. Cardiomegaly with diffuse bilateral pulmonary interstitial prominence and bilateral effusions consistent with congestive heart failure.   Electronically Signed   By: Marcello Moores  Register   On: 10/31/2014 07:57   Dg Chest Port 1 View  10/28/2014   CLINICAL DATA:  81 year old female with pleural effusion.  EXAM: PORTABLE CHEST - 1 VIEW  COMPARISON:  Chest x-ray 10/27/2014.  FINDINGS: Lung volumes are low. Extensive bibasilar opacities (right greater than left) may reflect areas of atelectasis and/or consolidation. Small to moderate left and moderate right-sided pleural effusions. Previously noted pleural fluid in the apex of the right hemithorax has redistributed. Small bore right-sided chest tube again noted, tip of which appears to extend into the apex of the right hemithorax. No cephalization of the pulmonary vasculature. Heart size appears mildly enlarged. The patient is rotated to the left on today's exam, resulting in distortion of the mediastinal contours and reduced diagnostic sensitivity and specificity for mediastinal pathology. Atherosclerosis in the thoracic aorta.  IMPRESSION: 1. Decreased size and read distribution of moderate right pleural effusion. 2. Persistent low lung volumes with bibasilar areas of atelectasis and/or consolidation. 3. Mild cardiomegaly. 4. Atherosclerosis.   Electronically Signed   By: Vinnie Langton M.D.   On: 10/28/2014 08:55   Dg Chest  Port 1 View  10/27/2014   CLINICAL DATA:  Pleural effusion.  EXAM: PORTABLE CHEST - 1 VIEW  COMPARISON:  10/24/2014 .  CT 08/30/2014.  FINDINGS: Right chest tube in stable position . No pneumothorax on today's exam. Cardiomegaly with bilateral pulmonary interstitial prominence and bilateral pleural effusions, right side greater than left noted. Right apical pleural thickening noted consistent with pleural effusion.  IMPRESSION: 1. Right chest tube in stable position. No pneumothorax noted on today's exam. 2. Congestive heart failure with bilateral pulmonary interstitial edema and bilateral pleural effusion. Right pleural effusion is prominent.   Electronically Signed   By: Marcello Moores  Register   On: 10/27/2014 07:39   Dg Abd Acute W/chest  10/24/2014   CLINICAL DATA:  RIGHT lower quadrant pain beginning 2 days ago, nausea and vomiting. Pleural effusion.  EXAM: DG ABDOMEN ACUTE W/ 1V CHEST  COMPARISON:  Chest radiograph October 03, 2014  FINDINGS: RIGHT pleural fat extending to the lung apex, with decrease, smaller loose decidual pleural effusion. Cardiac silhouette is moderately enlarged unchanged. Calcified aortic knob. Similar chronic moderate bronchitic changes without focal consolidation. No pneumothorax. Soft tissue planes included osseous structure nonsuspicious, mild degenerative change of the thoracic spine.  Paucity of bowel gas. Surgical clips along LEFT abdomen associated with apparent large hernia, the lateral aspect not fully imaged. The hernia sac appears to contain multiple loops of bowel. No intra-abdominal mass effect or pathologic calcifications. No intraperitoneal free air on the decubitus view. Moderate degenerative change of the lumbar spine.  IMPRESSION: RIGHT chest tube, small residual RIGHT pleural effusion, improved.  Stable cardiomegaly and moderate chronic bronchitic changes.  Large suspected LEFT lateral abdominal wall hernia containing bowel without bowel obstruction.   Electronically Signed    By: Elon Alas M.D.   On: 10/24/2014 05:16         Subjective: Patient complains of cough with yellow sputum. She denies any fevers, chills, chest pain,  shortness breath, nausea, vomiting, diarrhea. She is making stool from her ostomy. Abdominal pain is about the same. Denies any hemoptysis.  Objective: Filed Vitals:   10/31/14 0559 10/31/14 0652 10/31/14 1101 10/31/14 1250  BP: 124/46 138/56 134/112 147/63  Pulse: 75 65 61 68  Temp:  98.6 F (37 C)  98.4 F (36.9 C)  TempSrc: Oral Oral  Oral  Resp: 17 20 15 17   Height:      Weight:      SpO2: 100% 100% 98% 94%    Intake/Output Summary (Last 24 hours) at 10/31/14 1941 Last data filed at 10/31/14 1000  Gross per 24 hour  Intake    120 ml  Output   1175 ml  Net  -1055 ml   Weight change: 3.48 kg (7 lb 10.8 oz) Exam:   General:  Pt is alert, follows commands appropriately, not in acute distress  HEENT: No icterus, No thrush, No neck mass, [place]/AT  Cardiovascular: RRR, S1/S2, no rubs, no gallops  Respiratory: Bibasilar rales. No wheezing.  Abdomen: Soft/+BS, non tender, non distended, no guarding  Extremities: trace LE edema, No lymphangitis, No petechiae, No rashes, no synovitis  Data Reviewed: Basic Metabolic Panel:  Recent Labs Lab 10/27/14 0220 10/28/14 0454 10/29/14 0550 10/30/14 0700 10/31/14 0722  NA 140 139 133* 131* 134*  K 4.3 4.3 4.2 4.4 4.4  CL 102 104 98* 99* 101  CO2 21* 23 23 22 23   GLUCOSE 152* 111* 106* 133* 135*  BUN 63* 73* 74* 79* 73*  CREATININE 4.06* 4.22* 4.51* 4.23* 3.80*  CALCIUM 8.3* 8.3* 8.1* 7.9* 8.2*  PHOS 7.9*  --   --  5.5* 4.2   Liver Function Tests:  Recent Labs Lab 10/26/14 0350 10/27/14 0220 10/30/14 0700 10/31/14 0722  AST 17 19  --   --   ALT 6* 8*  --   --   ALKPHOS 82 79  --   --   BILITOT 1.3* 1.4*  --   --   PROT 5.5* 5.5*  --   --   ALBUMIN 2.1* 2.0* 1.9* 2.0*   No results for input(s): LIPASE, AMYLASE in the last 168 hours. No results for  input(s): AMMONIA in the last 168 hours. CBC:  Recent Labs Lab 10/25/14 0445 10/26/14 0350 10/27/14 0500 10/29/14 0550  WBC 9.9 9.7 15.3* 9.3  HGB 10.2* 10.2* 10.4* 9.1*  HCT 34.4* 34.2* 34.1* 30.3*  MCV 97.5 95.8 95.5 93.8  PLT 138* 141* 146* 137*   Cardiac Enzymes: No results for input(s): CKTOTAL, CKMB, CKMBINDEX, TROPONINI in the last 168 hours. BNP: Invalid input(s): POCBNP CBG:  Recent Labs Lab 10/30/14 1647 10/30/14 2156 10/31/14 0821 10/31/14 1224 10/31/14 1716  GLUCAP 183* 183* 133* 160* 120*    Recent Results (from the past 240 hour(s))  Urine culture     Status: None   Collection Time: 10/24/14  6:03 AM  Result Value Ref Range Status   Specimen Description URINE, RANDOM  Final   Special Requests ADDED [id number] [phone]  Final   Culture >=100,000 COLONIES/mL ESCHERICHIA COLI  Final   Report Status 10/26/2014 FINAL  Final   Organism ID, Bacteria ESCHERICHIA COLI  Final      Susceptibility   Escherichia coli - MIC*    AMPICILLIN <=2 SENSITIVE Sensitive     CEFAZOLIN <=4 SENSITIVE Sensitive     CEFTRIAXONE <=1 SENSITIVE Sensitive     CIPROFLOXACIN <=0.25 SENSITIVE Sensitive     GENTAMICIN <=1 SENSITIVE Sensitive  IMIPENEM <=0.25 SENSITIVE Sensitive     NITROFURANTOIN <=16 SENSITIVE Sensitive     TRIMETH/SULFA <=20 SENSITIVE Sensitive     AMPICILLIN/SULBACTAM <=2 SENSITIVE Sensitive     PIP/TAZO <=4 SENSITIVE Sensitive     * >=100,000 COLONIES/mL ESCHERICHIA COLI  Surgical pcr screen     Status: None   Collection Time: 10/26/14  5:33 AM  Result Value Ref Range Status   MRSA, PCR NEGATIVE NEGATIVE Final   Staphylococcus aureus NEGATIVE NEGATIVE Final    Comment:        The Xpert SA Assay (FDA approved for NASAL specimens in patients over 43 years of age), is one component of a comprehensive surveillance program.  Test performance has been validated by [facility] for patients greater than or equal to 2 year old. It is not intended to diagnose  infection nor to guide or monitor treatment.      Scheduled Meds: . aspirin EC  81 mg Oral QHS  . calcitRIOL  0.25 mcg Oral Daily  . carvedilol  6.25 mg Oral BID  . darbepoetin (ARANESP) injection - NON-DIALYSIS  100 mcg Subcutaneous Q Mon-1800  . docusate sodium  100 mg Oral BID  . furosemide  80 mg Oral BID  . heparin  5,000 Units Subcutaneous 3 times per day  . insulin aspart  0-9 Units Subcutaneous TID WC  . LORazepam  0.5 mg Oral BID  . mirtazapine  7.5 mg Oral QHS  . polyethylene glycol  17 g Oral Daily  . saccharomyces boulardii  250 mg Oral BID  . simvastatin  20 mg Oral q1800  . sodium chloride  3 mL Intravenous Q12H   Continuous Infusions:    Luvern Mcisaac, DO  [facility] Pager [phone]  If 7PM-7AM, please contact night-coverage [email or web address] Password [email or web address] 10/31/2014, 7:41 PM   LOS: 7 days

## 2014-10-31 NOTE — Progress Notes (Signed)
[facility] Progress Note  5 Days Post-Op  Subjective: Pt says she feels really bloated.  Gas pains.  No N/V, not very hungry though.  Hasn't had breakfast.    Objective: Vital signs in last 24 hours: Temp:  [98.2 F (36.8 C)-99 F (37.2 C)] 98.6 F (37 C) (08/09 9211) Pulse Rate:  [62-75] 65 (08/09 0652) Resp:  [17-20] 20 (08/09 0652) BP: (116-138)/(46-65) 138/56 mmHg (08/09 0652) SpO2:  [97 %-100 %] 100 % (08/09 0652) Weight:  [86.8 kg (191 lb 5.8 oz)] 86.8 kg (191 lb 5.8 oz) (08/09 0550) Last BM Date: 10/30/14  Intake/Output from previous day: 08/08 0701 - 08/09 0700 In: 120 [P.O.:120] Out: 1325 [Urine:1325] Intake/Output this shift:    PE: Gen:  Alert, NAD, pleasant Abd: RUQ pleural drain in place, Soft, mild distension and tenderness in RUQ, +BS, no HSM, large RUQ open incisions C/D/I closed with staples in place   Lab Results:   Recent Labs  10/29/14 0550  WBC 9.3  HGB 9.1*  HCT 30.3*  PLT 137*   BMET  Recent Labs  10/30/14 0700 10/31/14 0722  NA 131* 134*  K 4.4 4.4  CL 99* 101  CO2 22 23  GLUCOSE 133* 135*  BUN 79* 73*  CREATININE 4.23* 3.80*  CALCIUM 7.9* 8.2*   PT/INR No results for input(s): LABPROT, INR in the last 72 hours. CMP     Component Value Date/Time   NA 134* 10/31/2014 0722   K 4.4 10/31/2014 0722   CL 101 10/31/2014 0722   CO2 23 10/31/2014 0722   GLUCOSE 135* 10/31/2014 0722   BUN 73* 10/31/2014 0722   CREATININE 3.80* 10/31/2014 0722   CALCIUM 8.2* 10/31/2014 0722   CALCIUM 8.9 10/20/2014 1526   PROT 5.5* 10/27/2014 0220   ALBUMIN 2.0* 10/31/2014 0722   AST 19 10/27/2014 0220   ALT 8* 10/27/2014 0220   ALKPHOS 79 10/27/2014 0220   BILITOT 1.4* 10/27/2014 0220   GFRNONAA 10* 10/31/2014 0722   GFRAA 11* 10/31/2014 0722   Lipase     Component Value Date/Time   LIPASE 58* 10/24/2014 0500       Studies/Results: Dg Chest Port 1 View  10/31/2014   CLINICAL DATA:  Right pleural effusion.  EXAM:  PORTABLE CHEST - 1 VIEW  COMPARISON:  10/30/2014.  CT 08/30/2014.  FINDINGS: Right chest tube in stable position. No pneumothorax. Mediastinum and hilar structures are normal. Cardiomegaly with diffuse bilateral from interstitial prominence and bilateral effusions, right side greater than left. Findings suggest congestive heart failure. Interstitial pneumonitis cannot be excluded.  IMPRESSION: 1. Right chest tube in stable position.  No pneumothorax. 2. Cardiomegaly with diffuse bilateral pulmonary interstitial prominence and bilateral effusions consistent with congestive heart failure.   Electronically Signed   By: Marcello Moores  Register   On: 10/31/2014 07:57    Anti-infectives: Anti-infectives    Start     Dose/Rate Route Frequency Ordered Stop   10/27/14 1100  cefTRIAXone (ROCEPHIN) 1 g in dextrose 5 % 50 mL IVPB  Status:  Discontinued     1 g 100 mL/hr over 30 Minutes Intravenous Every 24 hours 10/27/14 1012 10/28/14 1004   10/24/14 1100  piperacillin-tazobactam (ZOSYN) IVPB 2.25 g  Status:  Discontinued     2.25 g 100 mL/hr over 30 Minutes Intravenous 3 times per day 10/24/14 1035 10/27/14 1012   10/24/14 0730  piperacillin-tazobactam (ZOSYN) IVPB 3.375 g     3.375 g 100 mL/hr over 30 Minutes Intravenous  Once  10/24/14 0718 10/24/14 0832       Assessment/Plan Acute cholecystitis POD #5, s/p open chole -tolerating a HH diet -would DC staples around POD #10 given she is older -pulm toilet and mobilize, agree with PT -patient is surgically stable for dc to SNF/home when cleared by renal and cards -Add bowel regimen    LOS: 7 days    Nat Christen 10/31/2014, 9:37 AM Pager: [phone]

## 2014-10-31 NOTE — Clinical Social Work Note (Signed)
Clinical Social Work Assessment  Patient Details  Name: [name] MRN: [id number] Date of Birth: 07-10-1925  Date of referral:  10/31/14               Reason for consult:  Facility Placement                Permission sought to share information with:  Family Supports Permission granted to share information::  Yes, Verbal Permission Granted  Name::     [name]  Relationship::  Daughter  Contact Information:  [phone]  Housing/Transportation Living arrangements for the past 2 months:  Pace of Information:  Patient, Adult Children Patient Interpreter Needed:  None Criminal Activity/Legal Involvement Pertinent to Current Situation/Hospitalization:  No - Comment as needed Significant Relationships:  Adult Children Lives with:  Adult Children Do you feel safe going back to the place where you live?  Yes Need for family participation in patient care:  Yes (Comment)  Care giving concerns:  Patient daughter expresses concerns regarding patient placement close to home.  Patient daughter has worked at [employer] for 23+ years and has made contact with admissions Warehouse manager.   Social Worker assessment / plan:  Holiday representative spoke with patient at bedside and patient daughter over the phone to offer support and discuss patient needs at discharge.  Patient states that she currently lives at home with her daughter and would like to return home, however is agreeable to ST-SNF prior to home.  Patient requested CSW contact daughter for details.  CSW contacted patient daughter over the phone who provided request for [facility].  CSW initiated referral and awaiting return response from facility regarding bed offer.  CSW to follow up with patient and patient family regarding available bed offer.  CSW remains available for support and to facilitate patient discharge needs once medically stable.  Employment status:  Retired Forensic scientist:   Medicare PT Recommendations:  [place] / Referral to community resources:  [place]  Patient/Family's Response to care:  Patient and patient daughter anxious to hear about potential placement at [employer].  Patient and family agreeable with short term placement.  Patient and family verbalize understanding of CSW role and appreciation for support and concern.  Patient/Family's Understanding of and Emotional Response to Diagnosis, Current Treatment, and Prognosis:  Patient and family understanding of patient needs and limitations.  Patient family hopeful for a solid recovery and patient ability to return home.  Emotional Assessment Appearance:  Appears younger than stated age Attitude/Demeanor/Rapport:  Guarded, Lethargic (Cooperative) Affect (typically observed):  Appropriate, Calm, Flat Orientation:  Oriented to Self, Oriented to Place, Oriented to  Time, Oriented to Situation Alcohol / Substance use:  Not Applicable Psych involvement (Current and /or in the community):  No (Comment)  Discharge Needs  Concerns to be addressed:  No discharge needs identified Readmission within the last 30 days:  No Current discharge risk:  Physical Impairment Barriers to Discharge:  Continued Medical Work up  [employer], [place]

## 2014-10-31 NOTE — Progress Notes (Signed)
S:  Feels bad all over.  CO coughing phlegm that seems coming mostly from post pharynx O:BP 138/56 mmHg  Pulse 65  Temp(Src) 98.6 F (37 C) (Oral)  Resp 20  Ht 5\' 3"  (1.6 m)  Wt 86.8 kg (191 lb 5.8 oz)  BMI 33.91 kg/m2  SpO2 100%  Intake/Output Summary (Last 24 hours) at 10/31/14 1032 Last data filed at 10/31/14 1000  Gross per 24 hour  Intake    120 ml  Output   1175 ml  Net  -1055 ml   Weight change: 3.48 kg (7 lb 10.8 oz) QHU:TMLYY and alert TKP:TWSFK,CLEXN 3/6 syatolic m LSB Resp: Bilat crackles Rt>>Lt Abd:+ BS ND  Mild tenderness around incision.  + Ostomy bag Ext: trace edema NEURO: CNI Ox3 no asterixis   . aspirin EC  81 mg Oral QHS  . calcitRIOL  0.25 mcg Oral Daily  . carvedilol  6.25 mg Oral BID  . darbepoetin (ARANESP) injection - NON-DIALYSIS  100 mcg Subcutaneous Q Mon-1800  . docusate sodium  100 mg Oral BID  . heparin  5,000 Units Subcutaneous 3 times per day  . insulin aspart  0-9 Units Subcutaneous TID WC  . LORazepam  0.5 mg Oral BID  . mirtazapine  7.5 mg Oral QHS  . polyethylene glycol  17 g Oral Daily  . saccharomyces boulardii  250 mg Oral BID  . simvastatin  20 mg Oral q1800  . sodium chloride  3 mL Intravenous Q12H   Dg Chest Port 1 View  10/31/2014   CLINICAL DATA:  Right pleural effusion.  EXAM: PORTABLE CHEST - 1 VIEW  COMPARISON:  10/30/2014.  CT 08/30/2014.  FINDINGS: Right chest tube in stable position. No pneumothorax. Mediastinum and hilar structures are normal. Cardiomegaly with diffuse bilateral from interstitial prominence and bilateral effusions, right side greater than left. Findings suggest congestive heart failure. Interstitial pneumonitis cannot be excluded.  IMPRESSION: 1. Right chest tube in stable position.  No pneumothorax. 2. Cardiomegaly with diffuse bilateral pulmonary interstitial prominence and bilateral effusions consistent with congestive heart failure.   Electronically Signed   By: Marcello Moores  Register   On: 10/31/2014 07:57    BMET    Component Value Date/Time   NA 134* 10/31/2014 0722   K 4.4 10/31/2014 0722   CL 101 10/31/2014 0722   CO2 23 10/31/2014 0722   GLUCOSE 135* 10/31/2014 0722   BUN 73* 10/31/2014 0722   CREATININE 3.80* 10/31/2014 0722   CALCIUM 8.2* 10/31/2014 0722   CALCIUM 8.9 10/20/2014 1526   GFRNONAA 10* 10/31/2014 0722   GFRAA 11* 10/31/2014 0722   CBC    Component Value Date/Time   WBC 9.3 10/29/2014 0550   RBC 3.23* 10/29/2014 0550   RBC 3.54* 10/14/2009 0043   HGB 9.1* 10/29/2014 0550   HCT 30.3* 10/29/2014 0550   PLT 137* 10/29/2014 0550   MCV 93.8 10/29/2014 0550   MCH 28.2 10/29/2014 0550   MCHC 30.0 10/29/2014 0550   RDW 16.1* 10/29/2014 0550   LYMPHSABS 1.2 10/24/2014 0500   MONOABS 0.9 10/24/2014 0500   EOSABS 0.0 10/24/2014 0500   BASOSABS 0.0 10/24/2014 0500     Assessment:  1. Acute on CKD (baseline 3), Scr sl improved.  Plan is to treat ARF medically without HD.  CXR shows some edema, will resume lasix 2. SP cholecystectomy 3. Sec HPTH 4. Anemia on outpt ESA   Plan: 1. Will resume her lasix 80mg  BID 2. Daily Scr   Ashely Joshua T

## 2014-10-31 NOTE — Clinical Social Work Placement (Signed)
   CLINICAL SOCIAL WORK PLACEMENT  NOTE  Date:  10/31/2014  Patient Details  Name: DARLETH EUSTACHE MRN: [id number] Date of Birth: June 30, 1925  Clinical Social Work is seeking post-discharge placement for this patient at the [place] level of care (*CSW will initial, date and re-position this form in  chart as items are completed):  Yes   Patient/family provided with [place] Work Department's list of facilities offering this level of care within the geographic area requested by the patient (or if unable, by the patient's family).  Yes   Patient/family informed of their freedom to choose among providers that offer the needed level of care, that participate in Medicare, Medicaid or managed care program needed by the patient, have an available bed and are willing to accept the patient.  Yes   Patient/family informed of [facility]'s ownership interest in [facility] and [facility], as well as of the fact that they are under no obligation to receive care at these facilities.  PASRR submitted to EDS on 10/31/14     PASRR number received on 10/31/14     Existing PASRR number confirmed on       FL2 transmitted to all facilities in geographic area requested by pt/family on 10/31/14     FL2 transmitted to all facilities within larger geographic area on       Patient informed that his/her managed care company has contracts with or will negotiate with certain facilities, including the following:            Patient/family informed of bed offers received.  Patient chooses bed at       Physician recommends and patient chooses bed at      Patient to be transferred to   on  .  Patient to be transferred to facility by       Patient family notified on   of transfer.  Name of family member notified:        PHYSICIAN Please sign FL2     Additional Comment:   Barbette Or, [place]

## 2014-10-31 NOTE — Progress Notes (Signed)
Patient drained about 10cc from pleurx drain and was hooked up for 30 minutes. Patient tolerated well and stated she is only hooked up for that long at home. Patient's daughter at bedside who normally does drain at home stated "she normally drains about that much at home".

## 2014-10-31 NOTE — Progress Notes (Signed)
Patient Name: [name] Date of Encounter: 10/31/2014  Active Problems:   Type II diabetes mellitus with complication   Acute cholecystitis   Choledocholithiasis with acute cholecystitis   Aortic stenosis, severe   Preoperative cardiovascular examination   Cholecystitis   Acute renal failure superimposed on stage 4 chronic kidney disease  SUBJECTIVE  Denies chest pain, SOB or palpitation. Feels better.   CURRENT MEDS . aspirin EC  81 mg Oral QHS  . calcitRIOL  0.25 mcg Oral Daily  . carvedilol  6.25 mg Oral BID  . darbepoetin (ARANESP) injection - NON-DIALYSIS  100 mcg Subcutaneous Q Mon-1800  . docusate sodium  100 mg Oral BID  . furosemide  80 mg Oral BID  . heparin  5,000 Units Subcutaneous 3 times per day  . insulin aspart  0-9 Units Subcutaneous TID WC  . LORazepam  0.5 mg Oral BID  . mirtazapine  7.5 mg Oral QHS  . polyethylene glycol  17 g Oral Daily  . saccharomyces boulardii  250 mg Oral BID  . simvastatin  20 mg Oral q1800  . sodium chloride  3 mL Intravenous Q12H    OBJECTIVE  Filed Vitals:   10/31/14 0559 10/31/14 0652 10/31/14 1101 10/31/14 1250  BP: 124/46 138/56 134/112 147/63  Pulse: 75 65 61 68  Temp:  98.6 F (37 C)  98.4 F (36.9 C)  TempSrc: Oral Oral  Oral  Resp: 17 20 15 17   Height:      Weight:      SpO2: 100% 100% 98% 94%    Intake/Output Summary (Last 24 hours) at 10/31/14 1335 Last data filed at 10/31/14 1000  Gross per 24 hour  Intake    120 ml  Output   1175 ml  Net  -1055 ml   Filed Weights   10/29/14 0543 10/30/14 0546 10/31/14 0550  Weight: 183 lb 10.3 oz (83.3 kg) 183 lb 11 oz (83.32 kg) 191 lb 5.8 oz (86.8 kg)    PHYSICAL EXAM  General: Pleasant, NAD. Neuro: Alert and oriented X 3. Moves all extremities spontaneously. Psych: Normal affect. HEENT:  Normal  Neck: Supple without bruits or JVD Lungs:  Resp regular and unlabored, R base with Diminished breath sound. R>L base crackles.  Heart: Ir Ir,  no s3, s4. 3/6  SEM.  Abdomen: Soft, non-tender, non-distended, BS + x 4.  Extremities: No clubbing, cyanosis. Trace BL LE edema. DP/PT/Radials 2+ and equal bilaterally.  Accessory Clinical Findings  CBC  Recent Labs  10/29/14 0550  WBC 9.3  HGB 9.1*  HCT 30.3*  MCV 93.8  PLT 283*   Basic Metabolic Panel  Recent Labs  10/30/14 0700 10/31/14 0722  NA 131* 134*  K 4.4 4.4  CL 99* 101  CO2 22 23  GLUCOSE 133* 135*  BUN 79* 73*  CREATININE 4.23* 3.80*  CALCIUM 7.9* 8.2*  PHOS 5.5* 4.2   Liver Function Tests  Recent Labs  10/30/14 0700 10/31/14 0722  ALBUMIN 1.9* 2.0*    TELE  afib at rate of 70s.   Radiology/Studies  Ct Abdomen Pelvis Wo Contrast  10/24/2014   CLINICAL DATA:  81 year old female with right flank/right upper quadrant pain for 2 days. Nausea and vomiting. Drain in place to drain fluid from lungs and abdomen. Subsequent encounter.  EXAM: CT ABDOMEN AND PELVIS WITHOUT CONTRAST  TECHNIQUE: Multidetector CT imaging of the abdomen and pelvis was performed following the standard protocol without IV contrast.  COMPARISON:  10/24/2014 plain film exam.  09/22/2014 CT.  FINDINGS: Inferior right chest tube is in place with decrease in size but incomplete clearance of right-sided hydro pneumothorax.  Very small left-sided pleural effusion with basilar atelectasis.  Enlarged gallbladder contains small calcified stones with surrounding inflammation suggestive of acute cholecystitis.  Pneumobilia once again noted in the common bile duct which may be related to prior sphincterotomy. No calcified common bile duct stone visualized.  Post colectomy with surgical clips surrounding the cecum/right colon and colostomy left lower abdomen with large parastomal hernia containing small and large bowel. Previously stomach partially entered the hernia but has been reduced. No free intraperitoneal air.  Cardiomegaly. Mitral valve and aortic valve calcifications. Coronary artery calcifications.   Atherosclerotic type changes aorta with mild ectasia. Atherosclerotic type changes iliac arteries.  Right kidney rotated anteriorly. No renal or ureteral obstructing stone. Left upper pole 1.2 cm cyst. Calcification left hilum may be vascular in origin.  Taking into account limitation by non contrast imaging, no worrisome hepatic, splenic, pancreatic or adrenal lesion.  Post hysterectomy.  No urinary bladder abnormality noted.  Heterogeneous bone marrow as previously noted. Cannot exclude metastatic disease myeloma. Schmorl's node deformities. Spinal stenosis most notable L4-5.  Scattered normal to top-normal size lymph nodes.  IMPRESSION: Enlarged gallbladder contains small calcified stones with surrounding inflammation suggestive of acute cholecystitis.  Pneumobilia once again noted in the common bile duct which may be related to prior sphincterotomy. No calcified common bile duct stone visualized.  Colostomy left lower abdomen with large parastomal hernia containing small and large bowel.  Inferior right chest tube is in place with decrease in size but incomplete clearance of right-sided hydro pneumothorax.  Very small left-sided pleural effusion with basilar atelectasis.  Cardiomegaly. Mitral valve and aortic valve calcifications. Coronary artery calcifications.  Atherosclerotic type changes aorta, aortic branch vessels and iliac arteries.  Heterogeneous bone marrow as previously noted. Cannot exclude metastatic disease or myeloma. Schmorl's node deformities. Spinal stenosis most notable L4-5.  These results were called by telephone at the time of interpretation on 10/24/2014 at 7:10 am to Dr. Mingo Amber , who verbally acknowledged these results.   Electronically Signed   By: Genia Del M.D.   On: 10/24/2014 07:22   Dg Chest 2 View  10/03/2014   CLINICAL DATA:  Right pleural drainage catheter inserted on September 12, 2014 4 multiloculated hydro pneumothorax ; experiencing some shortness of breath today  EXAM: CHEST   2 VIEW  COMPARISON:  Portable chest x-ray of September 27, 2014 and PA and lateral chest x-ray of September 12, 2014.  FINDINGS: There has been marked improvement in the appearance of the pulmonary interstitium bilaterally. The cardiac silhouette remains enlarged. The pulmonary vascularity is less engorged than on the previous study. On the right there are at least 2 small air-fluid levels anteriorly in the mid hemi thorax. There is an air-fluid level at the right lung base posteriorly. The small caliber chest tube has its tip projecting over the posterior aspect of the right fourth rib. The bony thorax exhibits no acute abnormality.  IMPRESSION: There has been considerable interval improvement in the appearance of the chest since the previous study, but there remain loculated fluid collections in the anterior aspect of the mid right hemithorax and posteriorly in the inferior aspect of the thorax. The chest tube tip lies superiorly in the upper right pleural space posteriorly.   Electronically Signed   By: David  Martinique M.D.   On: 10/03/2014 12:37   Dg Chest [facility] [address]  10/31/2014   CLINICAL DATA:  Right pleural effusion.  EXAM: PORTABLE CHEST - 1 VIEW  COMPARISON:  10/30/2014.  CT 08/30/2014.  FINDINGS: Right chest tube in stable position. No pneumothorax. Mediastinum and hilar structures are normal. Cardiomegaly with diffuse bilateral from interstitial prominence and bilateral effusions, right side greater than left. Findings suggest congestive heart failure. Interstitial pneumonitis cannot be excluded.  IMPRESSION: 1. Right chest tube in stable position.  No pneumothorax. 2. Cardiomegaly with diffuse bilateral pulmonary interstitial prominence and bilateral effusions consistent with congestive heart failure.   Electronically Signed   By: Marcello Moores  Register   On: 10/31/2014 07:57   Dg Chest Port 1 View  10/28/2014   CLINICAL DATA:  81 year old female with pleural effusion.  EXAM: PORTABLE CHEST - 1 VIEW  COMPARISON:   Chest x-ray 10/27/2014.  FINDINGS: Lung volumes are low. Extensive bibasilar opacities (right greater than left) may reflect areas of atelectasis and/or consolidation. Small to moderate left and moderate right-sided pleural effusions. Previously noted pleural fluid in the apex of the right hemithorax has redistributed. Small bore right-sided chest tube again noted, tip of which appears to extend into the apex of the right hemithorax. No cephalization of the pulmonary vasculature. Heart size appears mildly enlarged. The patient is rotated to the left on today's exam, resulting in distortion of the mediastinal contours and reduced diagnostic sensitivity and specificity for mediastinal pathology. Atherosclerosis in the thoracic aorta.  IMPRESSION: 1. Decreased size and read distribution of moderate right pleural effusion. 2. Persistent low lung volumes with bibasilar areas of atelectasis and/or consolidation. 3. Mild cardiomegaly. 4. Atherosclerosis.   Electronically Signed   By: Vinnie Langton M.D.   On: 10/28/2014 08:55   Dg Chest Port 1 View  10/27/2014   CLINICAL DATA:  Pleural effusion.  EXAM: PORTABLE CHEST - 1 VIEW  COMPARISON:  10/24/2014 .  CT 08/30/2014.  FINDINGS: Right chest tube in stable position . No pneumothorax on today's exam. Cardiomegaly with bilateral pulmonary interstitial prominence and bilateral pleural effusions, right side greater than left noted. Right apical pleural thickening noted consistent with pleural effusion.  IMPRESSION: 1. Right chest tube in stable position. No pneumothorax noted on today's exam. 2. Congestive heart failure with bilateral pulmonary interstitial edema and bilateral pleural effusion. Right pleural effusion is prominent.   Electronically Signed   By: Marcello Moores  Register   On: 10/27/2014 07:39   Dg Abd Acute W/chest  10/24/2014   CLINICAL DATA:  RIGHT lower quadrant pain beginning 2 days ago, nausea and vomiting. Pleural effusion.  EXAM: DG ABDOMEN ACUTE W/ 1V CHEST   COMPARISON:  Chest radiograph October 03, 2014  FINDINGS: RIGHT pleural fat extending to the lung apex, with decrease, smaller loose decidual pleural effusion. Cardiac silhouette is moderately enlarged unchanged. Calcified aortic knob. Similar chronic moderate bronchitic changes without focal consolidation. No pneumothorax. Soft tissue planes included osseous structure nonsuspicious, mild degenerative change of the thoracic spine.  Paucity of bowel gas. Surgical clips along LEFT abdomen associated with apparent large hernia, the lateral aspect not fully imaged. The hernia sac appears to contain multiple loops of bowel. No intra-abdominal mass effect or pathologic calcifications. No intraperitoneal free air on the decubitus view. Moderate degenerative change of the lumbar spine.  IMPRESSION: RIGHT chest tube, small residual RIGHT pleural effusion, improved.  Stable cardiomegaly and moderate chronic bronchitic changes.  Large suspected LEFT lateral abdominal wall hernia containing bowel without bowel obstruction.   Electronically Signed   By: Elon Alas M.D.   On:  10/24/2014 05:16    ASSESSMENT AND PLAN    1. Severe aortic stenosis. Medical therapy. Echo (08/2014). LVEF: 65-70%. Severe aortic stenosis with Mean gradient (S): 34 mm Hg. 2. Chronic atrial fibrillation. Not on long-term anticoagulation because of comorbidities and fall risk. 3. Chronic renal insufficiency. 4. History of congestive heart failure. History of recurrent right pleural effusions requiring prior multiple thoracenteses.CXR showed edema, Nephrology has resumed lasix 80mg  BID. She will need low dose furosemide at discharge.    Jarrett Soho PA-C Pager [phone]  Agree with above assessment and plan.

## 2014-11-01 DIAGNOSIS — K8040 Calculus of bile duct with cholecystitis, unspecified, without obstruction: Secondary | ICD-10-CM

## 2014-11-01 LAB — RENAL FUNCTION PANEL
ALBUMIN: 2 g/dL — AB (ref 3.5–5.0)
Anion gap: 10 (ref 5–15)
BUN: 66 mg/dL — AB (ref 6–20)
CALCIUM: 8.4 mg/dL — AB (ref 8.9–10.3)
CO2: 24 mmol/L (ref 22–32)
Chloride: 103 mmol/L (ref 101–111)
Creatinine, Ser: 3.46 mg/dL — ABNORMAL HIGH (ref 0.44–1.00)
GFR calc non Af Amer: 11 mL/min — ABNORMAL LOW (ref >60)
GFR, EST AFRICAN AMERICAN: 13 mL/min — AB (ref >60)
GLUCOSE: 108 mg/dL — AB (ref 65–99)
Phosphorus: 3.8 mg/dL (ref 2.5–4.6)
Potassium: 4.1 mmol/L (ref 3.5–5.1)
SODIUM: 137 mmol/L (ref 135–145)

## 2014-11-01 LAB — GLUCOSE, CAPILLARY
GLUCOSE-CAPILLARY: 107 mg/dL — AB (ref 65–99)
Glucose-Capillary: 205 mg/dL — ABNORMAL HIGH (ref 65–99)

## 2014-11-01 MED ORDER — POLYETHYLENE GLYCOL 3350 17 G PO PACK: 17.0000 g | PACK | Freq: Every day | ORAL | Status: DC

## 2014-11-01 MED ORDER — MIRTAZAPINE 7.5 MG PO TABS: 7.5000 mg | ORAL_TABLET | Freq: Every day | ORAL | Status: DC

## 2014-11-01 MED ORDER — LEVALBUTEROL HCL 0.63 MG/3ML IN NEBU: 0.6300 mg | INHALATION_SOLUTION | Freq: Three times a day (TID) | RESPIRATORY_TRACT | Status: DC | PRN

## 2014-11-01 MED ORDER — DOCUSATE SODIUM 100 MG PO CAPS: 100.0000 mg | ORAL_CAPSULE | Freq: Every day | ORAL | Status: DC

## 2014-11-01 MED ORDER — LORAZEPAM 0.5 MG PO TABS: 0.5000 mg | ORAL_TABLET | Freq: Two times a day (BID) | ORAL | Status: AC

## 2014-11-01 MED ORDER — TRAMADOL HCL 50 MG PO TABS: 50.0000 mg | ORAL_TABLET | Freq: Four times a day (QID) | ORAL | Status: DC | PRN

## 2014-11-01 NOTE — Progress Notes (Signed)
Patient Name: [name] Date of Encounter: 11/01/2014  Active Problems:   Type II diabetes mellitus with complication   Acute cholecystitis   Choledocholithiasis with acute cholecystitis   Aortic stenosis, severe   Preoperative cardiovascular examination   Cholecystitis   Acute renal failure superimposed on stage 4 chronic kidney disease  SUBJECTIVE  No complain. Feels better. Denies chest pain, SOB or palpitation.  CURRENT MEDS . aspirin EC  81 mg Oral QHS  . calcitRIOL  0.25 mcg Oral Daily  . carvedilol  6.25 mg Oral BID  . darbepoetin (ARANESP) injection - NON-DIALYSIS  100 mcg Subcutaneous Q Mon-1800  . [START ON 11/02/2014] docusate sodium  100 mg Oral Daily  . furosemide  80 mg Oral BID  . heparin  5,000 Units Subcutaneous 3 times per day  . insulin aspart  0-9 Units Subcutaneous TID WC  . LORazepam  0.5 mg Oral BID  . mirtazapine  7.5 mg Oral QHS  . polyethylene glycol  17 g Oral Daily  . saccharomyces boulardii  250 mg Oral BID  . simvastatin  20 mg Oral q1800  . sodium chloride  3 mL Intravenous Q12H    OBJECTIVE  Filed Vitals:   10/31/14 1250 10/31/14 2238 11/01/14 0608 11/01/14 0956  BP: 147/63 122/53 138/44 130/52  Pulse: 68 79 84 68  Temp: 98.4 F (36.9 C) 99.3 F (37.4 C) 98.6 F (37 C)   TempSrc: Oral Oral Oral   Resp: 17  18   Height:      Weight:  179 lb 14.3 oz (81.6 kg)    SpO2: 94% 94% 94%     Intake/Output Summary (Last 24 hours) at 11/01/14 1217 Last data filed at 11/01/14 0850  Gross per 24 hour  Intake      0 ml  Output   1000 ml  Net  -1000 ml   Filed Weights   10/30/14 0546 10/31/14 0550 10/31/14 2238  Weight: 183 lb 11 oz (83.32 kg) 191 lb 5.8 oz (86.8 kg) 179 lb 14.3 oz (81.6 kg)    PHYSICAL EXAM  General: Pleasant, NAD. Neuro: Alert and oriented X 3. Moves all extremities spontaneously. Psych: Normal affect. HEENT:  Normal  Neck: Supple without bruits or JVD Lungs:  Resp regular and unlabored. Scattered expiratory  wheezing and diminished breath sound bibasilar. Rt pluerex tube. Bilateral crackles R>L. Heart: Ir Ir,  no s3, s4. 3/6 SEM.  Abdomen: Soft, non-tender, non-distended, BS + x 4. Ostomy bag Extremities: No clubbing, cyanosis. Trace BL LE edema. DP/PT/Radials 2+ and equal bilaterally.  Accessory Clinical Findings  CBC No results for input(s): WBC, NEUTROABS, HGB, HCT, MCV, PLT in the last 72 hours. Basic Metabolic Panel  Recent Labs  10/31/14 0722 11/01/14 0617  NA 134* 137  K 4.4 4.1  CL 101 103  CO2 23 24  GLUCOSE 135* 108*  BUN 73* 66*  CREATININE 3.80* 3.46*  CALCIUM 8.2* 8.4*  PHOS 4.2 3.8   Liver Function Tests  Recent Labs  10/31/14 0722 11/01/14 0617  ALBUMIN 2.0* 2.0*    TELE  afib at rate of 70s.   Radiology/Studies  Ct Abdomen Pelvis Wo Contrast  10/24/2014   CLINICAL DATA:  81 year old female with right flank/right upper quadrant pain for 2 days. Nausea and vomiting. Drain in place to drain fluid from lungs and abdomen. Subsequent encounter.  EXAM: CT ABDOMEN AND PELVIS WITHOUT CONTRAST  TECHNIQUE: Multidetector CT imaging of the abdomen and pelvis was performed following the standard  protocol without IV contrast.  COMPARISON:  10/24/2014 plain film exam.  09/22/2014 CT.  FINDINGS: Inferior right chest tube is in place with decrease in size but incomplete clearance of right-sided hydro pneumothorax.  Very small left-sided pleural effusion with basilar atelectasis.  Enlarged gallbladder contains small calcified stones with surrounding inflammation suggestive of acute cholecystitis.  Pneumobilia once again noted in the common bile duct which may be related to prior sphincterotomy. No calcified common bile duct stone visualized.  Post colectomy with surgical clips surrounding the cecum/right colon and colostomy left lower abdomen with large parastomal hernia containing small and large bowel. Previously stomach partially entered the hernia but has been reduced. No free  intraperitoneal air.  Cardiomegaly. Mitral valve and aortic valve calcifications. Coronary artery calcifications.  Atherosclerotic type changes aorta with mild ectasia. Atherosclerotic type changes iliac arteries.  Right kidney rotated anteriorly. No renal or ureteral obstructing stone. Left upper pole 1.2 cm cyst. Calcification left hilum may be vascular in origin.  Taking into account limitation by non contrast imaging, no worrisome hepatic, splenic, pancreatic or adrenal lesion.  Post hysterectomy.  No urinary bladder abnormality noted.  Heterogeneous bone marrow as previously noted. Cannot exclude metastatic disease myeloma. Schmorl's node deformities. Spinal stenosis most notable L4-5.  Scattered normal to top-normal size lymph nodes.  IMPRESSION: Enlarged gallbladder contains small calcified stones with surrounding inflammation suggestive of acute cholecystitis.  Pneumobilia once again noted in the common bile duct which may be related to prior sphincterotomy. No calcified common bile duct stone visualized.  Colostomy left lower abdomen with large parastomal hernia containing small and large bowel.  Inferior right chest tube is in place with decrease in size but incomplete clearance of right-sided hydro pneumothorax.  Very small left-sided pleural effusion with basilar atelectasis.  Cardiomegaly. Mitral valve and aortic valve calcifications. Coronary artery calcifications.  Atherosclerotic type changes aorta, aortic branch vessels and iliac arteries.  Heterogeneous bone marrow as previously noted. Cannot exclude metastatic disease or myeloma. Schmorl's node deformities. Spinal stenosis most notable L4-5.  These results were called by telephone at the time of interpretation on 10/24/2014 at 7:10 am to Dr. Mingo Amber , who verbally acknowledged these results.   Electronically Signed   By: Genia Del M.D.   On: 10/24/2014 07:22   Dg Chest 2 View  10/03/2014   CLINICAL DATA:  Right pleural drainage catheter  inserted on September 12, 2014 4 multiloculated hydro pneumothorax ; experiencing some shortness of breath today  EXAM: CHEST  2 VIEW  COMPARISON:  Portable chest x-ray of September 27, 2014 and PA and lateral chest x-ray of September 12, 2014.  FINDINGS: There has been marked improvement in the appearance of the pulmonary interstitium bilaterally. The cardiac silhouette remains enlarged. The pulmonary vascularity is less engorged than on the previous study. On the right there are at least 2 small air-fluid levels anteriorly in the mid hemi thorax. There is an air-fluid level at the right lung base posteriorly. The small caliber chest tube has its tip projecting over the posterior aspect of the right fourth rib. The bony thorax exhibits no acute abnormality.  IMPRESSION: There has been considerable interval improvement in the appearance of the chest since the previous study, but there remain loculated fluid collections in the anterior aspect of the mid right hemithorax and posteriorly in the inferior aspect of the thorax. The chest tube tip lies superiorly in the upper right pleural space posteriorly.   Electronically Signed   By: David  Martinique M.D.  On: 10/03/2014 12:37   Dg Chest Port 1 View  10/31/2014   CLINICAL DATA:  Right pleural effusion.  EXAM: PORTABLE CHEST - 1 VIEW  COMPARISON:  10/30/2014.  CT 08/30/2014.  FINDINGS: Right chest tube in stable position. No pneumothorax. Mediastinum and hilar structures are normal. Cardiomegaly with diffuse bilateral from interstitial prominence and bilateral effusions, right side greater than left. Findings suggest congestive heart failure. Interstitial pneumonitis cannot be excluded.  IMPRESSION: 1. Right chest tube in stable position.  No pneumothorax. 2. Cardiomegaly with diffuse bilateral pulmonary interstitial prominence and bilateral effusions consistent with congestive heart failure.   Electronically Signed   By: Marcello Moores  Register   On: 10/31/2014 07:57   Dg Chest Port 1  View  10/28/2014   CLINICAL DATA:  81 year old female with pleural effusion.  EXAM: PORTABLE CHEST - 1 VIEW  COMPARISON:  Chest x-ray 10/27/2014.  FINDINGS: Lung volumes are low. Extensive bibasilar opacities (right greater than left) may reflect areas of atelectasis and/or consolidation. Small to moderate left and moderate right-sided pleural effusions. Previously noted pleural fluid in the apex of the right hemithorax has redistributed. Small bore right-sided chest tube again noted, tip of which appears to extend into the apex of the right hemithorax. No cephalization of the pulmonary vasculature. Heart size appears mildly enlarged. The patient is rotated to the left on today's exam, resulting in distortion of the mediastinal contours and reduced diagnostic sensitivity and specificity for mediastinal pathology. Atherosclerosis in the thoracic aorta.  IMPRESSION: 1. Decreased size and read distribution of moderate right pleural effusion. 2. Persistent low lung volumes with bibasilar areas of atelectasis and/or consolidation. 3. Mild cardiomegaly. 4. Atherosclerosis.   Electronically Signed   By: Vinnie Langton M.D.   On: 10/28/2014 08:55   Dg Chest Port 1 View  10/27/2014   CLINICAL DATA:  Pleural effusion.  EXAM: PORTABLE CHEST - 1 VIEW  COMPARISON:  10/24/2014 .  CT 08/30/2014.  FINDINGS: Right chest tube in stable position . No pneumothorax on today's exam. Cardiomegaly with bilateral pulmonary interstitial prominence and bilateral pleural effusions, right side greater than left noted. Right apical pleural thickening noted consistent with pleural effusion.  IMPRESSION: 1. Right chest tube in stable position. No pneumothorax noted on today's exam. 2. Congestive heart failure with bilateral pulmonary interstitial edema and bilateral pleural effusion. Right pleural effusion is prominent.   Electronically Signed   By: Marcello Moores  Register   On: 10/27/2014 07:39   Dg Abd Acute W/chest  10/24/2014   CLINICAL DATA:   RIGHT lower quadrant pain beginning 2 days ago, nausea and vomiting. Pleural effusion.  EXAM: DG ABDOMEN ACUTE W/ 1V CHEST  COMPARISON:  Chest radiograph October 03, 2014  FINDINGS: RIGHT pleural fat extending to the lung apex, with decrease, smaller loose decidual pleural effusion. Cardiac silhouette is moderately enlarged unchanged. Calcified aortic knob. Similar chronic moderate bronchitic changes without focal consolidation. No pneumothorax. Soft tissue planes included osseous structure nonsuspicious, mild degenerative change of the thoracic spine.  Paucity of bowel gas. Surgical clips along LEFT abdomen associated with apparent large hernia, the lateral aspect not fully imaged. The hernia sac appears to contain multiple loops of bowel. No intra-abdominal mass effect or pathologic calcifications. No intraperitoneal free air on the decubitus view. Moderate degenerative change of the lumbar spine.  IMPRESSION: RIGHT chest tube, small residual RIGHT pleural effusion, improved.  Stable cardiomegaly and moderate chronic bronchitic changes.  Large suspected LEFT lateral abdominal wall hernia containing bowel without bowel obstruction.   Electronically  Signed   By: Elon Alas M.D.   On: 10/24/2014 05:16    ASSESSMENT AND PLAN    1. Severe aortic stenosis. Medical therapy. Echo (08/2014). LVEF: 65-70%. Severe aortic stenosis with Mean gradient (S): 34 mm Hg. 2. Chronic atrial fibrillation. Not on long-term anticoagulation because of comorbidities and fall risk. Continue coreg and ASA.  3. Chronic renal insufficiency - per renal  4. History of congestive heart failure. History of recurrent right pleural effusions requiring prior multiple thoracenteses.CXR showed edema, Resumed lasix 80mg  BID. Diuresed 776ml yesterday. Consider switching IV lasix to PO tomorrow or later today. Seems close to euvolemic. She will need low dose furosemide at discharge.  Creatinine improved to 3.46. Baseline around 3.    Signed, Bhagat,Bhavinkumar PA-C Pager [phone] She is on Lasix 80 mg BID po now. This was her previous home dose. Would continue this dose at discharge. She is in no distress on room air. Mild basilar rales and decreased breath sounds right base. Irregular rhythm. Grade 3/6 AS murmur.  She states she will be going home rather than to SNF.  Will sign off now.

## 2014-11-01 NOTE — Care Management Note (Signed)
Case Management Note  Patient Details  Name: [name] MRN: [id number] Date of Birth: Oct 19, 1925  Subjective/Objective:     NCM received call from [place] stating patient wants to go home now since they can not get the facility they wanted.  NCM went to speak with patient and her daughter, daughter states they want [facility] for [facility], PT, [place], aide and Education officer, museum.  Referral made to [facility] , [place].  Patient is for dc today.  Soc will begin 24-48 hrs post dc.               Action/Plan:   Expected Discharge Date:                  Expected Discharge Plan:  [place]  In-House Referral:  Clinical Social Work  Discharge planning Services  CM Consult (pt had HHRN, HHPT, and Nurse Aide with [facility] after  hospital Discharge September 29, 2014)  Post Acute Care Choice:    Choice offered to:  Adult Children  DME Arranged:    DME Agency:     HH Arranged:  RN, PT, OT, Nurse's Aide (Education officer, museum) [place]:  [place]  Status of Service:  Completed, signed off  Medicare Important Message Given:  Yes-third notification given Date Medicare IM Given:    Medicare IM give by:    Date Additional Medicare IM Given:    Additional Medicare Important Message give by:     If discussed at [place] of Stay Meetings, dates discussed:    Additional Comments:  Zenon Mayo, RN 11/01/2014, 2:38 PM

## 2014-11-01 NOTE — Progress Notes (Signed)
Lady Deutscher to be D/C'd Home per MD order.  Discussed with the patient and all questions fully answered.  VSS. Pleurx Drain site clean dry and intact. Abdominal surgical incision staples clean dry and intact. Patient has skin tears to bilateral arms covered with new foam dressings that are clean, dry and intact.  IV catheter discontinued intact. Site without signs and symptoms of complications. Dressing and pressure applied.  An After Visit Summary was printed and given to the patient. Patient received prescription.  D/c education completed with patient/family including follow up instructions, medication list, d/c activities limitations if indicated, with other d/c instructions as indicated by MD - patient able to verbalize understanding, all questions fully answered.   Patient instructed to return to ED, call 911, or call MD for any changes in condition.   Patient escorted via [place], and D/C home via private auto.  L'ESPERANCE, Krysti Hickling C 11/01/2014 11:41 AM

## 2014-11-01 NOTE — Care Management Note (Signed)
Case Management Note  Patient Details  Name: [name] MRN: [id number] Date of Birth: Jun 01, 1925  Subjective/Objective:   Patient is for dc to SNF today, CSW following.                 Action/Plan:   Expected Discharge Date:                  Expected Discharge Plan:  [place]  In-House Referral:  Clinical Social Work  Discharge planning Services  CM Consult (pt had HHRN, HHPT, and Nurse Aide with [facility] after  hospital Discharge September 29, 2014)  Post Acute Care Choice:    Choice offered to:     DME Arranged:    DME Agency:     HH Arranged:    [place] Agency:     Status of Service:  Completed, signed off  Medicare Important Message Given:  Yes-third notification given Date Medicare IM Given:    Medicare IM give by:    Date Additional Medicare IM Given:    Additional Medicare Important Message give by:     If discussed at [place] of Stay Meetings, dates discussed:    Additional Comments:  Zenon Mayo, RN 11/01/2014, 12:38 PM

## 2014-11-01 NOTE — Discharge Summary (Signed)
Physician Discharge Summary   Patient ID: [name] MRN: [id number] DOB/AGE: 1925/09/30 81 y.o.  Admit date: 10/24/2014 Discharge date: 11/01/2014  Primary Care Physician:  Abigail Miyamoto, MD  Discharge Diagnoses:    . Acute cholecystitis . Choledocholithiasis with acute cholecystitis . Type II diabetes mellitus with complication   CHF recurrent pleural effusion s/p Pleurex, Severe Aortic Stenosis   Acute on chronic renal failure Stage IV chronic kidney disease  Chronic/recurrent right pleural effusion, s/p Pleurx catheter placement  Severe symptomatic aortic stenosis  Chronic A. Fib    Consults: Gen. Surgery Cardiology, Dr. Mare Ferrari Nephrology, Dr. Mercy Moore   Recommendations for Outpatient Follow-up:  Please follow BMET at the time of appointment  TESTS THAT NEED FOLLOW-UP CBC, BMET   DIET: Carb modified diet    Allergies:   Allergies  Allergen Reactions  . Levaquin [Levofloxacin] Nausea Only  . Codeine Nausea Only  . Keflet [Cephalexin] Nausea Only  . Morphine And Related Nausea Only  . Oxycodone Other (See Comments)    Abnormal behavior     Discharge Medications:   Medication List    STOP taking these medications        HYDROcodone-acetaminophen 5-325 MG per tablet  Commonly known as:  NORCO/VICODIN      TAKE these medications        acetaminophen 500 MG tablet  Commonly known as:  TYLENOL  Take 500 mg by mouth every 6 (six) hours as needed for mild pain.     aspirin EC 81 MG tablet  Take 81 mg by mouth at bedtime.     calcitRIOL 0.25 MCG capsule  Commonly known as:  ROCALTROL  Take 0.25 mcg by mouth daily.     carvedilol 25 MG tablet  Commonly known as:  COREG  Take 25 mg by mouth 2 (two) times daily.     clotrimazole-betamethasone lotion  Commonly known as:  LOTRISONE  Apply 1 application topically daily as needed (apply to rash on stomach as needed).     docusate sodium 100 MG capsule  Commonly known as:  COLACE  Take 1  capsule (100 mg total) by mouth daily.     epoetin alfa 20000 UNIT/ML injection  Commonly known as:  EPOGEN,PROCRIT  20,000 Units. Every two weeks     furosemide 80 MG tablet  Commonly known as:  LASIX  Take 1 tablet (80 mg total) by mouth 2 (two) times daily.     insulin glargine 100 UNIT/ML injection  Commonly known as:  LANTUS  Inject 8 Units into the skin at bedtime as needed (if cbg over 200).     levalbuterol 0.63 MG/3ML nebulizer solution  Commonly known as:  XOPENEX  Take 3 mLs (0.63 mg total) by nebulization every 8 (eight) hours as needed for wheezing or shortness of breath.     linagliptin 5 MG Tabs tablet  Commonly known as:  TRADJENTA  Take 5 mg by mouth daily.     LORazepam 0.5 MG tablet  Commonly known as:  ATIVAN  Take 1 tablet (0.5 mg total) by mouth 2 (two) times daily.     meclizine 12.5 MG tablet  Commonly known as:  ANTIVERT  Take 12.5 mg by mouth as needed for dizziness.     mirtazapine 7.5 MG tablet  Commonly known as:  REMERON  Take 1 tablet (7.5 mg total) by mouth at bedtime.     polyethylene glycol packet  Commonly known as:  MIRALAX / GLYCOLAX  Take 17 g  by mouth daily.     promethazine 12.5 MG tablet  Commonly known as:  PHENERGAN  Take 1 tablet (12.5 mg total) by mouth every 6 (six) hours as needed for nausea or vomiting.     saccharomyces boulardii 250 MG capsule  Commonly known as:  FLORASTOR  Take 1 capsule (250 mg total) by mouth 2 (two) times daily.     simvastatin 20 MG tablet  Commonly known as:  ZOCOR  TAKE 1 TABLET (20 MG TOTAL) BY MOUTH AT BEDTIME.     traMADol 50 MG tablet  Commonly known as:  ULTRAM  Take 1 tablet (50 mg total) by mouth every 6 (six) hours as needed for moderate pain.     vitamin B-12 1000 MCG tablet  Commonly known as:  CYANOCOBALAMIN  Take 1,000 mcg by mouth daily.         Brief H and P: For complete details please refer to admission H and P, but in brief 81 year old female with history of stage  D severe symptomatic aortic stenosis, stage IV CKD with nonfunctioning AV fistula, chronic right pleural effusion with an indwelling drainage catheter, IDDM/type II DM, chronic A. fib not an anticoagulation candidate because of fall risk, debilitation/weakness, chronic diastolic CHF, remote colon cancer history status post colostomy. The patient had acalculus cholecystitis status post percutaneous cholecystotomy tube February 2016, and was admitted on 10/24/2014 with recurrent cholecystitis. The patient underwent open cholecystectomy on 8/4 per Dr. Donne Hazel and was returned to ICU for observation.   Hospital Course:  Acute cholecystitis with prior history of cholecystitis, choledocholithiasis with recent Biliary drainage perc cholecystostomy tube (04/2014) and ERCP (06/2014) CT abdomen showed Enlarged gallbladder contains small calcified stones with surrounding inflammation suggestive of acute cholecystitis. General surgery was consulted and patient underwent open cholecystectomy on 10/26/14. Patient is tolerating heart healthy diet without any difficulty. PT evaluation recommended skilled nursing facility however subsequently patient requested to be discharged home as she did not get the facility of her choice. She has appointments arranged outpatient with surgery.  CHF recurrent pleural effusion s/p Pleurex, Severe Aortic Stenosis. Echo (08/2014). LVEF: 65-70%. Severe aortic stenosis with Mean gradient (S): 34 mm Hg. Currently stable and does not appear to be overtly decompensated. Nephrology and cardiology were consulted and patient was initially placed on IV Lasix, currently has been transitioned to oral Lasix at home dose. She has a follow-up appointment with cardiology as well. Pleurx catheter was also drained on 8/9 prior to discharge. Patient normally has right-sided Pleurx drained once per week on Tues.--per family doesn't drain much. Patient was restarted on Lasix.  Acute on chronic renal failure  Stage IV chronic kidney disease - Baseline creatinine 2.3-2.6, Secondary to hemodynamic derangements in setting of recent acute cholecystitis. Nephrology was consulted and per Dr. Moshe Cipro on 8/7, patient is not a candidate for HD. Patient was restarted on oral Lasix at home dose, has been maintaining her creatinine function and improving.  Type II DM/IDDM - Oral hypoglycemics were held inpatient, patient was placed on sliding scale insulin. Hemoglobin A1c 6.5 on 7/16.  Severe symptomatic aortic stenosis - medical therapy only, not a good candidate for TAVR or any surgical intervention  Chronic/recurrent right pleural effusion, s/p Pleurx catheter placement - Continue to drain Pleurx catheter once per week. Last drained on 8/9.  Chronic A. Fib -Rate controlled, continue Coreg. Patient is not a candidate for anticoagulation due to falls. Aspirin restarted.  Anemia - Hemoglobin has slightly dropped. Stable -Baseline hemoglobin approximately 9  Leukocytosis -Remains afebrile and hemodynamically stable  Mild thrombocytopenia - Probably due to acute infection., No signs of bleeding, chronic dating back to September 2013.  Hx of colon cancer -s/p R-hemicolectomy 2002 with ostomy  Day of Discharge BP 130/52 mmHg  Pulse 68  Temp(Src) 98.6 F (37 C) (Oral)  Resp 18  Ht 5\' 3"  (1.6 m)  Wt 81.6 kg (179 lb 14.3 oz)  BMI 31.88 kg/m2  SpO2 94%  Physical Exam: General: Alert and awake oriented x3 not in any acute distress. HEENT: anicteric sclera, pupils reactive to light and accommodation CVS: S1-S2 clear no murmur rubs or gallops Chest: clear to auscultation bilaterally, no wheezing rales or rhonchi Abdomen: soft nontender, nondistended, normal bowel sounds, colostomy. Extremities: no cyanosis, clubbing or edema noted bilaterally Neuro: Cranial nerves II-XII intact, no focal neurological deficits   The results of significant diagnostics from this hospitalization (including  imaging, microbiology, ancillary and laboratory) are listed below for reference.    LAB RESULTS: Basic Metabolic Panel:  Recent Labs Lab 10/31/14 0722 11/01/14 0617  NA 134* 137  K 4.4 4.1  CL 101 103  CO2 23 24  GLUCOSE 135* 108*  BUN 73* 66*  CREATININE 3.80* 3.46*  CALCIUM 8.2* 8.4*  PHOS 4.2 3.8   Liver Function Tests:  Recent Labs Lab 10/26/14 0350 10/27/14 0220  10/31/14 0722 11/01/14 0617  AST 17 19  --   --   --   ALT 6* 8*  --   --   --   ALKPHOS 82 79  --   --   --   BILITOT 1.3* 1.4*  --   --   --   PROT 5.5* 5.5*  --   --   --   ALBUMIN 2.1* 2.0*  < > 2.0* 2.0*  < > = values in this interval not displayed. No results for input(s): LIPASE, AMYLASE in the last 168 hours. No results for input(s): AMMONIA in the last 168 hours. CBC:  Recent Labs Lab 10/27/14 0500 10/29/14 0550  WBC 15.3* 9.3  HGB 10.4* 9.1*  HCT 34.1* 30.3*  MCV 95.5 93.8  PLT 146* 137*   Cardiac Enzymes: No results for input(s): CKTOTAL, CKMB, CKMBINDEX, TROPONINI in the last 168 hours. BNP: Invalid input(s): POCBNP CBG:  Recent Labs Lab 11/01/14 0841 11/01/14 1145  GLUCAP 107* 205*    Significant Diagnostic Studies:  Ct Abdomen Pelvis Wo Contrast  10/24/2014   CLINICAL DATA:  80 year old female with right flank/right upper quadrant pain for 2 days. Nausea and vomiting. Drain in place to drain fluid from lungs and abdomen. Subsequent encounter.  EXAM: CT ABDOMEN AND PELVIS WITHOUT CONTRAST  TECHNIQUE: Multidetector CT imaging of the abdomen and pelvis was performed following the standard protocol without IV contrast.  COMPARISON:  10/24/2014 plain film exam.  09/22/2014 CT.  FINDINGS: Inferior right chest tube is in place with decrease in size but incomplete clearance of right-sided hydro pneumothorax.  Very small left-sided pleural effusion with basilar atelectasis.  Enlarged gallbladder contains small calcified stones with surrounding inflammation suggestive of acute  cholecystitis.  Pneumobilia once again noted in the common bile duct which may be related to prior sphincterotomy. No calcified common bile duct stone visualized.  Post colectomy with surgical clips surrounding the cecum/right colon and colostomy left lower abdomen with large parastomal hernia containing small and large bowel. Previously stomach partially entered the hernia but has been reduced. No free intraperitoneal air.  Cardiomegaly. Mitral valve and aortic valve calcifications. Coronary artery  calcifications.  Atherosclerotic type changes aorta with mild ectasia. Atherosclerotic type changes iliac arteries.  Right kidney rotated anteriorly. No renal or ureteral obstructing stone. Left upper pole 1.2 cm cyst. Calcification left hilum may be vascular in origin.  Taking into account limitation by non contrast imaging, no worrisome hepatic, splenic, pancreatic or adrenal lesion.  Post hysterectomy.  No urinary bladder abnormality noted.  Heterogeneous bone marrow as previously noted. Cannot exclude metastatic disease myeloma. Schmorl's node deformities. Spinal stenosis most notable L4-5.  Scattered normal to top-normal size lymph nodes.  IMPRESSION: Enlarged gallbladder contains small calcified stones with surrounding inflammation suggestive of acute cholecystitis.  Pneumobilia once again noted in the common bile duct which may be related to prior sphincterotomy. No calcified common bile duct stone visualized.  Colostomy left lower abdomen with large parastomal hernia containing small and large bowel.  Inferior right chest tube is in place with decrease in size but incomplete clearance of right-sided hydro pneumothorax.  Very small left-sided pleural effusion with basilar atelectasis.  Cardiomegaly. Mitral valve and aortic valve calcifications. Coronary artery calcifications.  Atherosclerotic type changes aorta, aortic branch vessels and iliac arteries.  Heterogeneous bone marrow as previously noted. Cannot  exclude metastatic disease or myeloma. Schmorl's node deformities. Spinal stenosis most notable L4-5.  These results were called by telephone at the time of interpretation on 10/24/2014 at 7:10 am to Dr. Mingo Amber , who verbally acknowledged these results.   Electronically Signed   By: Genia Del M.D.   On: 10/24/2014 07:22   Dg Abd Acute W/chest  10/24/2014   CLINICAL DATA:  RIGHT lower quadrant pain beginning 2 days ago, nausea and vomiting. Pleural effusion.  EXAM: DG ABDOMEN ACUTE W/ 1V CHEST  COMPARISON:  Chest radiograph October 03, 2014  FINDINGS: RIGHT pleural fat extending to the lung apex, with decrease, smaller loose decidual pleural effusion. Cardiac silhouette is moderately enlarged unchanged. Calcified aortic knob. Similar chronic moderate bronchitic changes without focal consolidation. No pneumothorax. Soft tissue planes included osseous structure nonsuspicious, mild degenerative change of the thoracic spine.  Paucity of bowel gas. Surgical clips along LEFT abdomen associated with apparent large hernia, the lateral aspect not fully imaged. The hernia sac appears to contain multiple loops of bowel. No intra-abdominal mass effect or pathologic calcifications. No intraperitoneal free air on the decubitus view. Moderate degenerative change of the lumbar spine.  IMPRESSION: RIGHT chest tube, small residual RIGHT pleural effusion, improved.  Stable cardiomegaly and moderate chronic bronchitic changes.  Large suspected LEFT lateral abdominal wall hernia containing bowel without bowel obstruction.   Electronically Signed   By: Elon Alas M.D.   On: 10/24/2014 05:16    2D ECHO:   Disposition and Follow-up: Discharge Instructions    Diet Carb Modified    Complete by:  As directed      Increase activity slowly    Complete by:  As directed             DISPOSITION: Home with home PT   DISCHARGE FOLLOW-UP Follow-up Information    Follow up with Rolm Bookbinder, MD On 11/17/2014.    Specialty:  General Surgery   Why:  arrive by 11:15am for your appointment   Contact information:   [address] [place] [address] [phone]       Follow up with [facility] On 11/07/2014.   Specialty:  General Surgery   Why:  For suture removal, you will see a nurse in our office for removal of your  staples   Contact information:   [address]  [place] [address] [phone]       Follow up with Abigail Miyamoto, MD On 12/04/2014.   Specialty:  Family Medicine   Why:  for hospital follow-up/  Appointment with Dr. Maceo Pro is on 12/04/14 at 4:15pm   Contact information:   [place] [address] [phone]       Follow up with Windy Kalata, MD. Schedule an appointment as soon as possible for a visit in 10 days.   Specialty:  Nephrology   Why:  for hospital follow-up/ Left message nurse will call patient with an appointment in 24hrs   Contact information:   [place] [address] [phone]        Time spent on Discharge: 35 minutes  Signed:   Ketty Bitton M.D. [facility] 11/01/2014, 1:15 PM Pager: [phone]

## 2014-11-01 NOTE — Progress Notes (Signed)
Patient informed by SW that she can not go to her intended nursing home that she wanted. Patient and family refusing to go to another SNF and are wanting to go home instead. CM notified to make home arrangements.

## 2014-11-01 NOTE — Progress Notes (Signed)
S:  Says she feels better today and says she is eating O:BP 130/52 mmHg  Pulse 68  Temp(Src) 98.6 F (37 C) (Oral)  Resp 18  Ht 5\' 3"  (1.6 m)  Wt 81.6 kg (179 lb 14.3 oz)  BMI 31.88 kg/m2  SpO2 94%  Intake/Output Summary (Last 24 hours) at 11/01/14 1138 Last data filed at 11/01/14 0850  Gross per 24 hour  Intake      0 ml  Output   1000 ml  Net  -1000 ml   Weight change: -5.2 kg (-11 lb 7.4 oz) EZM:OQHUT and alert MLY:YTKPT,WSFKC 3/6 syatolic m LSB Resp: Bilat crackles Rt>>Lt.  Rt pluerex tube Abd:+ BS ND  Mild tenderness around incision.  + Ostomy bag Ext: trace edema NEURO: CNI Ox3 no asterixis   . aspirin EC  81 mg Oral QHS  . calcitRIOL  0.25 mcg Oral Daily  . carvedilol  6.25 mg Oral BID  . darbepoetin (ARANESP) injection - NON-DIALYSIS  100 mcg Subcutaneous Q Mon-1800  . [START ON 11/02/2014] docusate sodium  100 mg Oral Daily  . furosemide  80 mg Oral BID  . heparin  5,000 Units Subcutaneous 3 times per day  . insulin aspart  0-9 Units Subcutaneous TID WC  . LORazepam  0.5 mg Oral BID  . mirtazapine  7.5 mg Oral QHS  . polyethylene glycol  17 g Oral Daily  . saccharomyces boulardii  250 mg Oral BID  . simvastatin  20 mg Oral q1800  . sodium chloride  3 mL Intravenous Q12H   Dg Chest Port 1 View  10/31/2014   CLINICAL DATA:  Right pleural effusion.  EXAM: PORTABLE CHEST - 1 VIEW  COMPARISON:  10/30/2014.  CT 08/30/2014.  FINDINGS: Right chest tube in stable position. No pneumothorax. Mediastinum and hilar structures are normal. Cardiomegaly with diffuse bilateral from interstitial prominence and bilateral effusions, right side greater than left. Findings suggest congestive heart failure. Interstitial pneumonitis cannot be excluded.  IMPRESSION: 1. Right chest tube in stable position.  No pneumothorax. 2. Cardiomegaly with diffuse bilateral pulmonary interstitial prominence and bilateral effusions consistent with congestive heart failure.   Electronically Signed   By:  Marcello Moores  Register   On: 10/31/2014 07:57   BMET    Component Value Date/Time   NA 137 11/01/2014 0617   K 4.1 11/01/2014 0617   CL 103 11/01/2014 0617   CO2 24 11/01/2014 0617   GLUCOSE 108* 11/01/2014 0617   BUN 66* 11/01/2014 0617   CREATININE 3.46* 11/01/2014 0617   CALCIUM 8.4* 11/01/2014 0617   CALCIUM 8.9 10/20/2014 1526   GFRNONAA 11* 11/01/2014 0617   GFRAA 13* 11/01/2014 0617   CBC    Component Value Date/Time   WBC 9.3 10/29/2014 0550   RBC 3.23* 10/29/2014 0550   RBC 3.54* 10/14/2009 0043   HGB 9.1* 10/29/2014 0550   HCT 30.3* 10/29/2014 0550   PLT 137* 10/29/2014 0550   MCV 93.8 10/29/2014 0550   MCH 28.2 10/29/2014 0550   MCHC 30.0 10/29/2014 0550   RDW 16.1* 10/29/2014 0550   LYMPHSABS 1.2 10/24/2014 0500   MONOABS 0.9 10/24/2014 0500   EOSABS 0.0 10/24/2014 0500   BASOSABS 0.0 10/24/2014 0500     Assessment:  1. Acute on CKD (baseline 3), Scr improving and close to baseline of 3. 2. SP cholecystectomy 3. Sec HPTH 4. Anemia on outpt ESA   Plan: 1. Will sign off and she can FU Dr Moshe Cipro on Sept 12th 9:15 AM  Rainey Rodger T

## 2014-11-01 NOTE — Clinical Social Work Note (Signed)
CSW updated family this morning of denial by [facility]. Family is not interested in other SNF options at this time. Voicemail left for RNCM re this change in disposition. CSW signing off at this time.    Liz Beach MSW, [place], [place], [phone]

## 2014-11-02 NOTE — Consult Note (Signed)
   [facility] CM Inpatient Consult   11/02/2014  [name] 12-12-1925 [id number] Patient was discussed in Quality/LLOS meeting regarding long length of stay and her disposition for discharge was changed from recommended skill nursing facility back to home with home health care.  Patient has had multiple admission in the past 6 months and requested follow up with [facility] for community outreach and transition of care follow up.  Referral sent to [facility] as patient was discharged 11/01/14.  For questions, please contact: Natividad Brood, RN BSN [facility] Liaison  [phone] business mobile phone

## 2014-11-02 NOTE — Patient Outreach (Signed)
[facility]) Care Management  11/02/2014  NIANI MOURER October 20, 1925 [id number]   Referral from Natividad Brood, RN, assigned Jacqlyn Larsen, RN to outreach.  Thanks, Ronnell Freshwater. [place], [place] Assistant Phone: [phone] Fax: [phone]

## 2014-11-03 ENCOUNTER — Other Ambulatory Visit: Payer: Self-pay | Admitting: *Deleted

## 2014-11-03 ENCOUNTER — Ambulatory Visit (HOSPITAL_COMMUNITY)
Admission: RE | Admit: 2014-11-03 | Discharge: 2014-11-03 | Disposition: A | Discharge status: Home / Self Care | Payer: Medicare Other | Source: Ambulatory Visit | Attending: Nephrology | Admitting: Nephrology

## 2014-11-03 DIAGNOSIS — D631 Anemia in chronic kidney disease: Secondary | ICD-10-CM | POA: Diagnosis not present

## 2014-11-03 DIAGNOSIS — Z79899 Other long term (current) drug therapy: Secondary | ICD-10-CM | POA: Insufficient documentation

## 2014-11-03 DIAGNOSIS — Z5181 Encounter for therapeutic drug level monitoring: Secondary | ICD-10-CM | POA: Diagnosis not present

## 2014-11-03 DIAGNOSIS — N184 Chronic kidney disease, stage 4 (severe): Secondary | ICD-10-CM | POA: Diagnosis not present

## 2014-11-03 MED ORDER — EPOETIN ALFA 20000 UNIT/ML IJ SOLN
INTRAMUSCULAR | Status: AC
  Filled 2014-11-03: qty 1

## 2014-11-03 MED ORDER — EPOETIN ALFA 20000 UNIT/ML IJ SOLN
20000.0000 [IU] | INTRAMUSCULAR | Status: DC
  Administered 2014-11-03: 20000 [IU] via SUBCUTANEOUS

## 2014-11-03 NOTE — Patient Outreach (Signed)
11/03/14- Telephone call for transition of care week 1 completed, talked with daughter (pt gave permission) Otila Kluver and pt living with Otila Kluver in [place] for a short while and will be moving back to her home in 1-2 weeks and will have 24 hour care.  Pt is weighing daily, weight today 168 pounds, checking CBG BID, today's reading 90, has home health RN and PT.  Pt had "iron shot" today per daughter. RN CM talked with daughter about CHF action plan and importance of calling early for change in symptoms/ health status, daughter verbalizes understanding, daughter does not feel home visit by RN CM is needed at this time since home health is involved and pt will be in process of moving back home, will reassess need for home visit at later date.  RN CM faxed today's note to primary MD- Dr. Maceo Pro. Sent In Basket to have consent mailed to patient's home (as she will be returning there).   [facility] CM Care Plan Problem One        Patient Outreach Telephone from 11/03/2014 in [place] Problem One  High risk for readmission due to multiple chronic health conditions   Care Plan for Problem One  Active   [facility] Long Term Goal (31-90 days)  pt will have no admissions within 31 days   [facility] Long Term Goal Start Date  11/03/14   Interventions for Problem One Long Term Goal  RN CM reviewed all appointments with daughter and importance of pt seeing her primary care provider within 7-10 days of hospital discharge.   [facility] CM Short Term Goal #1 (0-30 days)  caregiver will verbalize CHF action plan within 7 days.   [facility] CM Short Term Goal #1 Start Date  11/03/14   Interventions for Short Term Goal #1  RN CM reviewed CHF action plan/ zones, importance of calling for weight gain over 3 pounds in one day and 5 pounds in one week, increased edema, dyspnea, cough, etc.      PLAN Continue weekly transition of care calls   Jacqlyn Larsen [facility], [place] Coordinator [phone]

## 2014-11-06 ENCOUNTER — Other Ambulatory Visit: Payer: Self-pay | Admitting: Thoracic Surgery (Cardiothoracic Vascular Surgery)

## 2014-11-06 DIAGNOSIS — J90 Pleural effusion, not elsewhere classified: Secondary | ICD-10-CM

## 2014-11-06 LAB — POCT HEMOGLOBIN-HEMACUE: Hemoglobin: 10.4 g/dL — ABNORMAL LOW (ref 12.0–15.0)

## 2014-11-07 ENCOUNTER — Ambulatory Visit
Admission: RE | Admit: 2014-11-07 | Discharge: 2014-11-07 | Disposition: A | Discharge status: Home / Self Care | Payer: Medicare Other | Source: Ambulatory Visit | Attending: Thoracic Surgery (Cardiothoracic Vascular Surgery) | Admitting: Thoracic Surgery (Cardiothoracic Vascular Surgery)

## 2014-11-07 ENCOUNTER — Ambulatory Visit (INDEPENDENT_AMBULATORY_CARE_PROVIDER_SITE_OTHER): Payer: Medicare Other | Admitting: Thoracic Surgery (Cardiothoracic Vascular Surgery)

## 2014-11-07 ENCOUNTER — Encounter: Payer: Self-pay | Admitting: Thoracic Surgery (Cardiothoracic Vascular Surgery)

## 2014-11-07 VITALS — BP 150/67 | HR 71 | Resp 18 | Ht 63.0 in | Wt 166.0 lb

## 2014-11-07 DIAGNOSIS — J90 Pleural effusion, not elsewhere classified: Secondary | ICD-10-CM

## 2014-11-07 DIAGNOSIS — I350 Nonrheumatic aortic (valve) stenosis: Secondary | ICD-10-CM

## 2014-11-07 DIAGNOSIS — J948 Other specified pleural conditions: Secondary | ICD-10-CM

## 2014-11-07 DIAGNOSIS — I6523 Occlusion and stenosis of bilateral carotid arteries: Secondary | ICD-10-CM

## 2014-11-07 DIAGNOSIS — I5032 Chronic diastolic (congestive) heart failure: Secondary | ICD-10-CM

## 2014-11-07 NOTE — Progress Notes (Signed)
[address]       [place] [address]             [phone]       HPI:  Mrs. Dutkiewicz returns today for follow-up appointment in regards to her right Pleurx catheter.  She is an 81 year old woman with multiple medical problems who had a recurrent right pleural effusion. I placed a pleural catheter on 09/12/2014. She initially drained the catheter 3 times a week with between 350- 450 mL at a time. The drainage then dropped off dramatically. I saw her in the office on 10/03/2014. We went to once a week drainage.  Since her last visit she was hospitalized to have her gallbladder removed. She says that her appetite is been poor since then. She's had almost no drainage out since her last visit with me.  The catheter was hooked to suction in the office today and there was no output.  Past Medical History  Diagnosis Date  . CKD (chronic kidney disease), stage IV 2014    a. Baseline Cr reported 2.5-2.6.  Marland Kitchen Anemia   . Urinary tract infection   . Colon cancer 1980s  . Chronic atrial fibrillation     a. not on anticoagulation due to increased risk of falls, advanced age.  . Chronic diastolic CHF (congestive heart failure)     a. Echo 10/2013: mild LVH, EF 60-65%, mod AS, mildly dilated LA, mod dilated RA, PASP 33.  Marland Kitchen Hypertension   . Carotid disease, bilateral     a. Carotid duplex 11/2328: 07-62% RICA/LICA.  Marland Kitchen H/O cardiovascular stress test     a. 2002: nuc negative for ischemia, EF 65%.  . Severe aortic stenosis 08/23/2014  . Depression with anxiety     situational  . Headache   . Arthritis   . Recurrent pleural effusion on right     s/p multiple taps prior to Pleurex catheter placement  . Choledocholithiasis with acute cholecystitis 06/14/2014  . Clostridium difficile diarrhea 09/2014  . Type II diabetes mellitus with complication 2/63/3354    Retinopathy, nepropathy.   . Thrombocytopenia 08/29/2014  . Cholecystitis 10/2014      Current Outpatient  Prescriptions  Medication Sig Dispense Refill  . acetaminophen (TYLENOL) 500 MG tablet Take 500 mg by mouth every 6 (six) hours as needed for mild pain.     Marland Kitchen aspirin EC 81 MG tablet Take 81 mg by mouth at bedtime.     . calcitRIOL (ROCALTROL) 0.25 MCG capsule Take 0.25 mcg by mouth daily.     . carvedilol (COREG) 25 MG tablet Take 25 mg by mouth 2 (two) times daily.  3  . clotrimazole-betamethasone (LOTRISONE) lotion Apply 1 application topically daily as needed (apply to rash on stomach as needed).   0  . docusate sodium (COLACE) 100 MG capsule Take 1 capsule (100 mg total) by mouth daily. 10 capsule 0  . epoetin alfa (EPOGEN,PROCRIT) 56256 UNIT/ML injection 20,000 Units. Every two weeks    . furosemide (LASIX) 80 MG tablet Take 1 tablet (80 mg total) by mouth 2 (two) times daily. 60 tablet 3  . insulin glargine (LANTUS) 100 UNIT/ML injection Inject 8 Units into the skin at bedtime as needed (if cbg over 200).     Marland Kitchen levalbuterol (XOPENEX) 0.63 MG/3ML nebulizer solution Take 3 mLs (0.63 mg total) by nebulization every 8 (eight) hours as needed for wheezing or shortness of breath. 3 mL 12  . linagliptin (TRADJENTA) 5 MG  TABS tablet Take 5 mg by mouth daily.     Marland Kitchen LORazepam (ATIVAN) 0.5 MG tablet Take 1 tablet (0.5 mg total) by mouth 2 (two) times daily. 30 tablet 0  . meclizine (ANTIVERT) 12.5 MG tablet Take 12.5 mg by mouth as needed for dizziness.     . mirtazapine (REMERON) 7.5 MG tablet Take 1 tablet (7.5 mg total) by mouth at bedtime. 30 tablet 0  . promethazine (PHENERGAN) 12.5 MG tablet Take 1 tablet (12.5 mg total) by mouth every 6 (six) hours as needed for nausea or vomiting. 30 tablet 0  . saccharomyces boulardii (FLORASTOR) 250 MG capsule Take 1 capsule (250 mg total) by mouth 2 (two) times daily. 60 capsule 0  . simvastatin (ZOCOR) 20 MG tablet TAKE 1 TABLET (20 MG TOTAL) BY MOUTH AT BEDTIME. 30 tablet 5  . traMADol (ULTRAM) 50 MG tablet Take 1 tablet (50 mg total) by mouth every 6  (six) hours as needed for moderate pain. 30 tablet 0  . vitamin B-12 (CYANOCOBALAMIN) 1000 MCG tablet Take 1,000 mcg by mouth daily.     No current facility-administered medications for this visit.    Physical Exam BP 150/67 mmHg  Pulse 71  Resp 18  Ht 5\' 3"  (1.6 m)  Wt 166 lb (75.297 kg)  BMI 29.41 kg/m2  SpO2 11% Obese 81 year old woman in no acute distress Alert and oriented 3 Diminished breath sounds at both lung bases, otherwise clear Pleural catheter site clean  Diagnostic Tests: I personally reviewed her chest x-ray. It is essentially unchanged from her last x-ray. There is some loculated fluid in the fissure.  Impression: 81 year old woman who had a pleural catheter placed for recurrent pleural effusions. Her drainage has been less than 100 mL total over the past month. There is no reason to leave pleural catheter in place at this point. I recommended that we discontinue it here in the office today.  Procedure note The pleural catheter exit site was sterilely prepped. 2 mL of 1% lidocaine was used to anesthetize the area around the subcutaneous cuff. The cuff was separated from surrounding subcutaneous tissue with gentle blunt dissection and the catheter was removed intact without difficulty.  Plan: I will be happy to see her back again if I can be of any further assistance with her care.  Melrose Nakayama, MD [facility] [phone]

## 2014-11-10 ENCOUNTER — Other Ambulatory Visit: Payer: Self-pay | Admitting: *Deleted

## 2014-11-10 ENCOUNTER — Encounter: Payer: Self-pay | Admitting: *Deleted

## 2014-11-10 ENCOUNTER — Other Ambulatory Visit: Payer: Self-pay | Admitting: Cardiology

## 2014-11-10 NOTE — Patient Outreach (Signed)
11/10/14- Telephone call for transition of care week 2,  Pt gave permission to speak with daughter [name] reports home health RN is with pt now, physical therapy continues working with pt. CBG in 90's range, weight today 166 pounds, pt is weighing daily and checking CBG, pleurex cathether removed on 11/07/14, pt has all medications and taking as prescribed, denies any medication changes since last medication review.  RN CM talked with home health RN [name] and she will be seeing pt for full episode for disease management, [name] reports pt is not on antidepressant and does have decreased appetite and difficulty sleeping, fatigue. Family and pt still do not want visit from [facility] RN CM but would like to continue weekly transition of care calls.  RN CM faxed today's note to Dr. Maceo Pro with depression screening.    DEPRESSION SCREENING:  Depression screen [facility] 2/9 11/10/2014  Decreased Interest 2  Down, Depressed, Hopeless 2  PHQ - 2 Score 4  Altered sleeping 2  Tired, decreased energy 3  Change in appetite 3  Feeling bad or failure about yourself  1  Trouble concentrating 0  Moving slowly or fidgety/restless 1  Suicidal thoughts 0  PHQ-9 Score 14   [facility] CM Care Plan Problem One        Patient Outreach Telephone from 11/10/2014 in [place] Problem One  High risk for readmission due to multiple chronic health conditions   Care Plan for Problem One  Active   [facility] Long Term Goal (31-90 days)  pt will have no admissions within 31 days   [facility] Long Term Goal Start Date  11/03/14   Interventions for Problem One Long Term Goal  RN CM reviewed, reinforced keeping all appointments with daughter and importance of pt seeing her primary care provider within 7-10 days of hospital discharge.   [facility] CM Short Term Goal #1 (0-30 days)  caregiver will verbalize CHF action plan within 7 days.   [facility] CM Short Term Goal #1 Start Date  11/03/14   [facility] CM Short Term Goal #1 Met Date   11/10/14   Interventions for Short Term Goal #1  RN CM reinforced CHF action plan/ zones, importance of calling for weight gain over 3 pounds in one day and 5 pounds in one week, increased edema, dyspnea, cough, etc.    [facility] CM Care Plan Problem Two        Patient Outreach Telephone from 11/10/2014 in [place] Problem Two  decreased appetite, fatigue related to symptoms of depression   Care Plan for Problem Two  Active   [facility] CM Short Term Goal #1 (0-30 days)  pt will exhibit increased appetite and energy within 14 days   [facility] CM Short Term Goal #1 Start Date  11/10/14   Interventions for Short Term Goal #2   RN CM faxed depression screening to primary MD Dr. Maceo Pro, RN CM ask daughter to discuss with MD- symptoms of depression, fatigue, decreased appetite.  RN CM encouraged pt to work with physical therapy to increase endurance, RN CM encouraged family to offer small frequent meals and pt to continue drinking nutitional supplements daily.      Jacqlyn Larsen [facility], [place] Coordinator [phone]

## 2014-11-11 ENCOUNTER — Other Ambulatory Visit: Payer: Self-pay | Admitting: Cardiology

## 2014-11-14 ENCOUNTER — Emergency Department (HOSPITAL_COMMUNITY): Payer: Medicare Other

## 2014-11-14 ENCOUNTER — Inpatient Hospital Stay (HOSPITAL_COMMUNITY)
Admission: EM | Admit: 2014-11-14 | Discharge: 2014-11-17 | DRG: 291 | Disposition: A | Discharge status: Hospice | Payer: Medicare Other | Attending: Internal Medicine | Admitting: Internal Medicine

## 2014-11-14 ENCOUNTER — Encounter (HOSPITAL_COMMUNITY): Payer: Self-pay | Admitting: *Deleted

## 2014-11-14 ENCOUNTER — Telehealth: Payer: Self-pay | Admitting: Pulmonary Disease

## 2014-11-14 DIAGNOSIS — Z881 Allergy status to other antibiotic agents status: Secondary | ICD-10-CM | POA: Diagnosis not present

## 2014-11-14 DIAGNOSIS — Z885 Allergy status to narcotic agent status: Secondary | ICD-10-CM

## 2014-11-14 DIAGNOSIS — I071 Rheumatic tricuspid insufficiency: Secondary | ICD-10-CM | POA: Diagnosis not present

## 2014-11-14 DIAGNOSIS — I10 Essential (primary) hypertension: Secondary | ICD-10-CM | POA: Diagnosis not present

## 2014-11-14 DIAGNOSIS — I4891 Unspecified atrial fibrillation: Secondary | ICD-10-CM | POA: Diagnosis present

## 2014-11-14 DIAGNOSIS — R0602 Shortness of breath: Secondary | ICD-10-CM | POA: Diagnosis present

## 2014-11-14 DIAGNOSIS — Z933 Colostomy status: Secondary | ICD-10-CM | POA: Diagnosis not present

## 2014-11-14 DIAGNOSIS — I6523 Occlusion and stenosis of bilateral carotid arteries: Secondary | ICD-10-CM | POA: Diagnosis present

## 2014-11-14 DIAGNOSIS — E1122 Type 2 diabetes mellitus with diabetic chronic kidney disease: Secondary | ICD-10-CM | POA: Diagnosis present

## 2014-11-14 DIAGNOSIS — F418 Other specified anxiety disorders: Secondary | ICD-10-CM | POA: Diagnosis present

## 2014-11-14 DIAGNOSIS — R197 Diarrhea, unspecified: Secondary | ICD-10-CM | POA: Diagnosis present

## 2014-11-14 DIAGNOSIS — I350 Nonrheumatic aortic (valve) stenosis: Secondary | ICD-10-CM | POA: Diagnosis present

## 2014-11-14 DIAGNOSIS — I130 Hypertensive heart and chronic kidney disease with heart failure and stage 1 through stage 4 chronic kidney disease, or unspecified chronic kidney disease: Secondary | ICD-10-CM | POA: Diagnosis present

## 2014-11-14 DIAGNOSIS — M1990 Unspecified osteoarthritis, unspecified site: Secondary | ICD-10-CM | POA: Diagnosis present

## 2014-11-14 DIAGNOSIS — Z79899 Other long term (current) drug therapy: Secondary | ICD-10-CM | POA: Diagnosis not present

## 2014-11-14 DIAGNOSIS — I517 Cardiomegaly: Secondary | ICD-10-CM | POA: Diagnosis not present

## 2014-11-14 DIAGNOSIS — I5031 Acute diastolic (congestive) heart failure: Secondary | ICD-10-CM | POA: Diagnosis present

## 2014-11-14 DIAGNOSIS — Z90710 Acquired absence of both cervix and uterus: Secondary | ICD-10-CM

## 2014-11-14 DIAGNOSIS — E785 Hyperlipidemia, unspecified: Secondary | ICD-10-CM | POA: Diagnosis present

## 2014-11-14 DIAGNOSIS — Z66 Do not resuscitate: Secondary | ICD-10-CM | POA: Diagnosis present

## 2014-11-14 DIAGNOSIS — N184 Chronic kidney disease, stage 4 (severe): Secondary | ICD-10-CM | POA: Diagnosis present

## 2014-11-14 DIAGNOSIS — Z515 Encounter for palliative care: Secondary | ICD-10-CM | POA: Diagnosis not present

## 2014-11-14 DIAGNOSIS — I509 Heart failure, unspecified: Secondary | ICD-10-CM

## 2014-11-14 DIAGNOSIS — Z833 Family history of diabetes mellitus: Secondary | ICD-10-CM

## 2014-11-14 DIAGNOSIS — L8990 Pressure ulcer of unspecified site, unspecified stage: Secondary | ICD-10-CM | POA: Insufficient documentation

## 2014-11-14 DIAGNOSIS — E11319 Type 2 diabetes mellitus with unspecified diabetic retinopathy without macular edema: Secondary | ICD-10-CM | POA: Diagnosis present

## 2014-11-14 DIAGNOSIS — I482 Chronic atrial fibrillation, unspecified: Secondary | ICD-10-CM

## 2014-11-14 DIAGNOSIS — Z794 Long term (current) use of insulin: Secondary | ICD-10-CM | POA: Diagnosis not present

## 2014-11-14 DIAGNOSIS — D649 Anemia, unspecified: Secondary | ICD-10-CM | POA: Diagnosis present

## 2014-11-14 DIAGNOSIS — Z85038 Personal history of other malignant neoplasm of large intestine: Secondary | ICD-10-CM | POA: Diagnosis not present

## 2014-11-14 DIAGNOSIS — I2510 Atherosclerotic heart disease of native coronary artery without angina pectoris: Secondary | ICD-10-CM | POA: Diagnosis present

## 2014-11-14 DIAGNOSIS — I5033 Acute on chronic diastolic (congestive) heart failure: Secondary | ICD-10-CM | POA: Diagnosis present

## 2014-11-14 DIAGNOSIS — Z9049 Acquired absence of other specified parts of digestive tract: Secondary | ICD-10-CM | POA: Diagnosis present

## 2014-11-14 DIAGNOSIS — Z720 Tobacco use: Secondary | ICD-10-CM | POA: Diagnosis not present

## 2014-11-14 DIAGNOSIS — Z9221 Personal history of antineoplastic chemotherapy: Secondary | ICD-10-CM | POA: Diagnosis not present

## 2014-11-14 DIAGNOSIS — E119 Type 2 diabetes mellitus without complications: Secondary | ICD-10-CM | POA: Diagnosis not present

## 2014-11-14 DIAGNOSIS — Z8249 Family history of ischemic heart disease and other diseases of the circulatory system: Secondary | ICD-10-CM | POA: Diagnosis not present

## 2014-11-14 DIAGNOSIS — Z7982 Long term (current) use of aspirin: Secondary | ICD-10-CM

## 2014-11-14 DIAGNOSIS — N179 Acute kidney failure, unspecified: Secondary | ICD-10-CM | POA: Diagnosis present

## 2014-11-14 DIAGNOSIS — E1121 Type 2 diabetes mellitus with diabetic nephropathy: Secondary | ICD-10-CM | POA: Diagnosis present

## 2014-11-14 DIAGNOSIS — R627 Adult failure to thrive: Secondary | ICD-10-CM | POA: Diagnosis present

## 2014-11-14 DIAGNOSIS — J90 Pleural effusion, not elsewhere classified: Secondary | ICD-10-CM | POA: Diagnosis present

## 2014-11-14 DIAGNOSIS — Z9981 Dependence on supplemental oxygen: Secondary | ICD-10-CM

## 2014-11-14 DIAGNOSIS — I5032 Chronic diastolic (congestive) heart failure: Secondary | ICD-10-CM | POA: Diagnosis present

## 2014-11-14 DIAGNOSIS — J9621 Acute and chronic respiratory failure with hypoxia: Secondary | ICD-10-CM | POA: Diagnosis not present

## 2014-11-14 DIAGNOSIS — I340 Nonrheumatic mitral (valve) insufficiency: Secondary | ICD-10-CM | POA: Diagnosis not present

## 2014-11-14 LAB — BASIC METABOLIC PANEL
ANION GAP: 11 (ref 5–15)
BUN: 75 mg/dL — ABNORMAL HIGH (ref 6–20)
CHLORIDE: 91 mmol/L — AB (ref 101–111)
CO2: 25 mmol/L (ref 22–32)
Calcium: 8.2 mg/dL — ABNORMAL LOW (ref 8.9–10.3)
Creatinine, Ser: 4.31 mg/dL — ABNORMAL HIGH (ref 0.44–1.00)
GFR calc non Af Amer: 8 mL/min — ABNORMAL LOW (ref >60)
GFR, EST AFRICAN AMERICAN: 10 mL/min — AB (ref >60)
Glucose, Bld: 124 mg/dL — ABNORMAL HIGH (ref 65–99)
POTASSIUM: 5.1 mmol/L (ref 3.5–5.1)
Sodium: 127 mmol/L — ABNORMAL LOW (ref 135–145)

## 2014-11-14 LAB — CBC WITH DIFFERENTIAL/PLATELET
BASOS PCT: 0 % (ref 0–1)
Basophils Absolute: 0 10*3/uL (ref 0.0–0.1)
Eosinophils Absolute: 0 10*3/uL (ref 0.0–0.7)
Eosinophils Relative: 0 % (ref 0–5)
HEMATOCRIT: 30.4 % — AB (ref 36.0–46.0)
HEMOGLOBIN: 9.7 g/dL — AB (ref 12.0–15.0)
LYMPHS PCT: 10 % — AB (ref 12–46)
Lymphs Abs: 1 10*3/uL (ref 0.7–4.0)
MCH: 28.5 pg (ref 26.0–34.0)
MCHC: 31.9 g/dL (ref 30.0–36.0)
MCV: 89.4 fL (ref 78.0–100.0)
MONOS PCT: 9 % (ref 3–12)
Monocytes Absolute: 0.9 10*3/uL (ref 0.1–1.0)
NEUTROS ABS: 8 10*3/uL — AB (ref 1.7–7.7)
NEUTROS PCT: 81 % — AB (ref 43–77)
Platelets: 202 10*3/uL (ref 150–400)
RBC: 3.4 MIL/uL — ABNORMAL LOW (ref 3.87–5.11)
RDW: 15.5 % (ref 11.5–15.5)
WBC: 9.9 10*3/uL (ref 4.0–10.5)

## 2014-11-14 LAB — TROPONIN I: Troponin I: <0.03 ng/mL (ref <0.031)

## 2014-11-14 LAB — BRAIN NATRIURETIC PEPTIDE: B NATRIURETIC PEPTIDE 5: 1462.2 pg/mL — AB (ref 0.0–100.0)

## 2014-11-14 MED ORDER — ONDANSETRON HCL 4 MG/2ML IJ SOLN
4.0000 mg | Freq: Once | INTRAMUSCULAR | Status: AC
  Administered 2014-11-14: 4 mg via INTRAVENOUS
  Filled 2014-11-14: qty 2

## 2014-11-14 MED ORDER — FUROSEMIDE 10 MG/ML IJ SOLN
80.0000 mg | Freq: Once | INTRAMUSCULAR | Status: AC
  Administered 2014-11-14: 80 mg via INTRAVENOUS
  Filled 2014-11-14: qty 8

## 2014-11-14 NOTE — ED Provider Notes (Signed)
CSN: [id number]     Arrival date & time 11/14/14  1849 History   First MD Initiated Contact with Patient 11/14/14 1859     Chief Complaint  Patient presents with  . Shortness of Breath     (Consider location/radiation/quality/duration/timing/severity/associated sxs/prior Treatment) Patient is a 81 y.o. female presenting with shortness of breath.  Shortness of Breath Severity:  Moderate Onset quality:  Gradual Duration:  1 week Timing:  Constant Progression:  Worsening Chronicity:  Recurrent Context comment:  Recently has had multiple hospitalizations.  notably, she had a pleural catheter removed about a week ago.   Relieved by: clearing thick discolored mucus. Worsened by:  Nothing tried Ineffective treatments:  None tried Associated symptoms: chest pain, cough and sputum production   Associated symptoms: no fever and no vomiting   Associated symptoms comment:  Nausea. Decreased appetite. Decreased fluid intake. Decreased urine output.    Past Medical History  Diagnosis Date  . CKD (chronic kidney disease), stage IV 2014    a. Baseline Cr reported 2.5-2.6.  Marland Kitchen Anemia   . Urinary tract infection   . Colon cancer 1980s  . Chronic atrial fibrillation     a. not on anticoagulation due to increased risk of falls, advanced age.  . Chronic diastolic CHF (congestive heart failure)     a. Echo 10/2013: mild LVH, EF 60-65%, mod AS, mildly dilated LA, mod dilated RA, PASP 33.  Marland Kitchen Hypertension   . Carotid disease, bilateral     a. Carotid duplex 08/2128: 86-57% RICA/LICA.  Marland Kitchen H/O cardiovascular stress test     a. 2002: nuc negative for ischemia, EF 65%.  . Severe aortic stenosis 08/23/2014  . Depression with anxiety     situational  . Headache   . Arthritis   . Recurrent pleural effusion on right     s/p multiple taps prior to Pleurex catheter placement  . Choledocholithiasis with acute cholecystitis 06/14/2014  . Clostridium difficile diarrhea 09/2014  . Type II diabetes mellitus with  complication 8/46/9629    Retinopathy, nepropathy.   . Thrombocytopenia 08/29/2014  . Cholecystitis 10/2014   Past Surgical History  Procedure Laterality Date  . Abdominal hysterectomy    . Colon surgery  2002    right colectomy for recurrent cancer  . Appendectomy    . Colostomy  1980s    low anterior resection of rectal cancer.   . Cardioversion  09/30/2011    Procedure: CARDIOVERSION;  Surgeon: [name], MD;  Location: [place];  Service: Cardiovascular;  Laterality: N/A;  . I&d extremity  11/20/2011    Procedure: IRRIGATION AND DEBRIDEMENT EXTREMITY;  Surgeon: Alta Corning, MD;  Location: [place];  Service: Orthopedics;  Laterality: Left;  . Av fistula placement Left 06/02/2012    Procedure: ARTERIOVENOUS (AV) FISTULA CREATION;  Surgeon: Conrad Rattan, MD;  Location: [place];  Service: Vascular;  Laterality: Left;  Bascilic Fistula  . Bascilic vein transposition Left 08/18/2012    Procedure: BASCILIC VEIN TRANSPOSITION;  Surgeon: Conrad Lime Ridge, MD;  Location: [place];  Service: Vascular;  Laterality: Left;  Left 2nd stage Basilic Vein Transposition   . Ercp N/A 07/11/2014    Procedure: ENDOSCOPIC RETROGRADE CHOLANGIOPANCREATOGRAPHY (ERCP);  Surgeon: Inda Castle, MD;  Location: [facility];  Service: Endoscopy;  Laterality: N/A;  . Biliary stent placement N/A 07/11/2014    Procedure: BILIARY STENT PLACEMENT;  Surgeon: Inda Castle, MD;  Location: [facility];  Service: Endoscopy;  Laterality: N/A;  . Ercp N/A  07/21/2014    Procedure: ENDOSCOPIC RETROGRADE CHOLANGIOPANCREATOGRAPHY (ERCP);  Surgeon: Inda Castle, MD;  Location: [facility];  Service: Endoscopy;  Laterality: N/A;  . Fracture surgery      Right leg  . Chest tube insertion Right 09/12/2014    Procedure: INSERTION PLEURAL DRAINAGE CATHETER;  Surgeon: Melrose Nakayama, MD;  Location: [place];  Service: Thoracic;  Laterality: Right;  . Ct perc cholecystostomy  05/11/2014  . Pleural effusion drainage  09/12/2014  .  Cholecystectomy N/A 10/26/2014    Procedure: OPEN CHOLECYSTECTOMY;  Surgeon: Rolm Bookbinder, MD;  Location: [facility] OR;  Service: General;  Laterality: N/A;   Family History  Problem Relation Age of Onset  . Heart disease Father   . Diabetes Sister   . Diabetes Daughter   . Hyperlipidemia Daughter   . Hypertension Daughter   . Other Daughter     varicose veins  . Cancer Son   . Diabetes Son   . Heart disease Son     before age 37  . Hyperlipidemia Son   . Hypertension Son   . Heart attack Father   . Stroke Father    Social History  Substance Use Topics  . Smoking status: Never Smoker   . Smokeless tobacco: Current User    Types: Chew  . Alcohol Use: No   OB History    No data available     Review of Systems  Constitutional: Negative for fever.  Respiratory: Positive for cough, sputum production and shortness of breath.   Cardiovascular: Positive for chest pain.  Gastrointestinal: Negative for vomiting.  All other systems reviewed and are negative.     Allergies  Levaquin; Codeine; Keflet; Morphine and related; and Oxycodone  Home Medications   Prior to Admission medications   Medication Sig Start Date End Date Taking? Authorizing Provider  acetaminophen (TYLENOL) 500 MG tablet Take 500 mg by mouth every 6 (six) hours as needed for mild pain.     Historical Provider, MD  aspirin EC 81 MG tablet Take 81 mg by mouth at bedtime.     Historical Provider, MD  calcitRIOL (ROCALTROL) 0.25 MCG capsule Take 0.25 mcg by mouth daily.  04/06/12   Historical Provider, MD  carvedilol (COREG) 25 MG tablet Take 25 mg by mouth 2 (two) times daily. 09/15/14   Historical Provider, MD  carvedilol (COREG) 25 MG tablet TAKE 1 TABLET (25 MG TOTAL) BY MOUTH 2 (TWO) TIMES DAILY WITH A MEAL. 11/10/14   [name], MD  clotrimazole-betamethasone (LOTRISONE) lotion Apply 1 application topically daily as needed (apply to rash on stomach as needed).  05/25/14   Historical Provider, MD  docusate  sodium (COLACE) 100 MG capsule Take 1 capsule (100 mg total) by mouth daily. 11/01/14   Ripudeep Krystal Eaton, MD  epoetin alfa (EPOGEN,PROCRIT) 66294 UNIT/ML injection 20,000 Units. Every two weeks    Historical Provider, MD  furosemide (LASIX) 80 MG tablet Take 1 tablet (80 mg total) by mouth 2 (two) times daily. 09/08/14   [name], MD  insulin glargine (LANTUS) 100 UNIT/ML injection Inject 8 Units into the skin at bedtime as needed (if cbg over 200).     Historical Provider, MD  levalbuterol Penne Lash) 0.63 MG/3ML nebulizer solution Take 3 mLs (0.63 mg total) by nebulization every 8 (eight) hours as needed for wheezing or shortness of breath. 11/01/14   Ripudeep Krystal Eaton, MD  linagliptin (TRADJENTA) 5 MG TABS tablet Take 5 mg by mouth daily.  Historical Provider, MD  LORazepam (ATIVAN) 0.5 MG tablet Take 1 tablet (0.5 mg total) by mouth 2 (two) times daily. 11/01/14   Ripudeep Krystal Eaton, MD  meclizine (ANTIVERT) 12.5 MG tablet Take 12.5 mg by mouth as needed for dizziness.  11/23/12   Historical Provider, MD  mirtazapine (REMERON) 7.5 MG tablet Take 1 tablet (7.5 mg total) by mouth at bedtime. 11/01/14   Ripudeep Krystal Eaton, MD  promethazine (PHENERGAN) 12.5 MG tablet Take 1 tablet (12.5 mg total) by mouth every 6 (six) hours as needed for nausea or vomiting. 03/14/14   Ripudeep Krystal Eaton, MD  saccharomyces boulardii (FLORASTOR) 250 MG capsule Take 1 capsule (250 mg total) by mouth 2 (two) times daily. 09/29/14   Florencia Reasons, MD  simvastatin (ZOCOR) 20 MG tablet TAKE 1 TABLET (20 MG TOTAL) BY MOUTH AT BEDTIME. 08/28/14   [name], MD  traMADol (ULTRAM) 50 MG tablet Take 1 tablet (50 mg total) by mouth every 6 (six) hours as needed for moderate pain. 11/01/14   Ripudeep Krystal Eaton, MD  vitamin B-12 (CYANOCOBALAMIN) 1000 MCG tablet Take 1,000 mcg by mouth daily.    Historical Provider, MD   BP 130/71 mmHg  Pulse 60  Temp(Src) 98.1 F (36.7 C)  Resp 25  Ht 5\' 4"  (1.626 m)  Wt 164 lb (74.39 kg)  BMI 28.14 kg/m2  SpO2  100% Physical Exam  Constitutional: She is oriented to person, place, and time. She appears well-developed and well-nourished. No distress.  HENT:  Head: Normocephalic and atraumatic.  Mouth/Throat: Oropharynx is clear and moist.  Eyes: Conjunctivae are normal. Pupils are equal, round, and reactive to light. No scleral icterus.  Neck: Neck supple.  Cardiovascular: Normal rate and intact distal pulses.  An irregularly irregular rhythm present.  Murmur heard.  Systolic murmur is present with a grade of 3/6  Pulmonary/Chest: Effort normal. No stridor. No respiratory distress. She has decreased breath sounds (right base). She has no wheezes. She has no rales.  Abdominal: Soft. Bowel sounds are normal. She exhibits no distension. There is no tenderness. There is no rigidity, no rebound and no guarding.  Mild tenderness around her well healed surgical scars.    Musculoskeletal: Normal range of motion.  Neurological: She is alert and oriented to person, place, and time.  Skin: Skin is warm and dry. No rash noted.  Psychiatric: She has a normal mood and affect. Her behavior is normal.  Nursing note and vitals reviewed.   ED Course  Procedures (including critical care time) Labs Review Labs Reviewed  BASIC METABOLIC PANEL - Abnormal; Notable for the following:    Sodium 127 (*)    Chloride 91 (*)    Glucose, Bld 124 (*)    BUN 75 (*)    Creatinine, Ser 4.31 (*)    Calcium 8.2 (*)    GFR calc non Af Amer 8 (*)    GFR calc Af Amer 10 (*)    All other components within normal limits  CBC WITH DIFFERENTIAL/PLATELET - Abnormal; Notable for the following:    RBC 3.40 (*)    Hemoglobin 9.7 (*)    HCT 30.4 (*)    Neutrophils Relative % 81 (*)    Neutro Abs 8.0 (*)    Lymphocytes Relative 10 (*)    All other components within normal limits  BRAIN NATRIURETIC PEPTIDE - Abnormal; Notable for the following:    B Natriuretic Peptide 1462.2 (*)    All other components within normal limits  TROPONIN I    Imaging Review Dg Chest Port 1 View  11/14/2014   CLINICAL DATA:  Shortness of breath and coughing.  Wheezing  EXAM: PORTABLE CHEST - 1 VIEW  COMPARISON:  10/28/2014  FINDINGS: There is mild cardiac enlargement. Increased in bilateral pleural effusions and interstitial edema. There is been worsening aeration to both lower lobes right greater than left.  IMPRESSION: 1. Interval progression of CHF pattern.   Electronically Signed   By: Kerby Moors M.D.   On: 11/14/2014 20:15   I have personally reviewed and evaluated these images and lab results as part of my medical decision-making.   EKG Interpretation   Date/Time:  Tuesday November 14 2014 18:55:46 EDT Ventricular Rate:  61 PR Interval:    QRS Duration: 137 QT Interval:  465 QTC Calculation: 468 R Axis:   114 Text Interpretation:  Atrial fibrillation IVCD, consider atypical RBBB  Anterolateral infarct, age indeterminate now with IVCD Confirmed by  [facility]  MD, TREY ([id number]) on 11/14/2014 11:07:05 PM      MDM   Final diagnoses:  Acute congestive heart failure, unspecified congestive heart failure type  Chronic atrial fibrillation  Acute renal failure superimposed on stage 4 chronic kidney disease    Workup consistent with acute CHF exacerbation in setting of chronic renal failure.  Will try IV lasix (she takes 80mg  PO BID). Admit to Stepdown unit under Dr. Blaine Hamper.    Serita Grit, MD 11/14/14 [phone]

## 2014-11-14 NOTE — H&P (Signed)
[facility] History and Physical  [name] UKG:[id number] DOB: 1925/05/29 DOA: 11/14/2014  Referring physician: ED physician PCP: Abigail Miyamoto, MD  Specialists:   Chief Complaint: Shortness of breath and worsening leg edema  HPI: [name] is a 81 y.o. female with PMH of chronic respiratory failure on 2 L oxygen at home, hyperlipidemia, diabetes mellitus, depression, CKD-IV, chronic recurrent pleural effusion (just removed chest tube on 11/07/14), colon cancer (s/p of chemotherapy and surgery), colostomy, atrial fibrillation not on anticoagulations due to high risk of fall, diastolic congestive heart failure (EF 65-70%), bilateral carotid artery disease, who presents with SOB and worsening leg edema.  Patient reports that in the past 7-10 days, she has been having worsening shortness of breath and leg edema bilaterally. She cough without significant sputum production. She does not have fever, chills, chest pain. She reports diarrhea from colostomy in the past 3 to 4 days. She has been changing bags several times each day. She has mild nausea, but no vomiting or abdominal pain. Pt states she is supposed to be on dialysis, but refused dialysis and has signed a DNR in the past.   In ED, patient was found to have being T 1462.2, troponin negative, WBC 9.9, temperature normal, no tachycardia, worsening renal function. CXR showed interval progression of CHF pattern. Patient is admitted to inpatient for further evaluation and treatment.     Where does patient live?   At home  Can patient participate in ADLs?  Barely  Review of Systems:   General: no fevers, chills, has poor appetite, has fatigue HEENT: no blurry vision, hearing changes or sore throat Pulm: has dyspnea, coughing, no wheezing CV: no chest pain, palpitations Abd: has nausea, no vomiting, abdominal pain, has diarrhea, no constipation GU: no dysuria, burning on urination, increased urinary frequency, hematuria  Ext:  has leg edema Neuro: no unilateral weakness, numbness, or tingling, no vision change or hearing loss Skin: no rash MSK: No muscle spasm, no deformity, no limitation of range of movement in spin Heme: No easy bruising.  Travel history: No recent long distant travel.  Allergy:  Allergies  Allergen Reactions  . Levaquin [Levofloxacin] Nausea Only  . Codeine Nausea Only  . Keflet [Cephalexin] Nausea Only  . Morphine And Related Nausea Only  . Oxycodone Other (See Comments)    Abnormal behavior    Past Medical History  Diagnosis Date  . CKD (chronic kidney disease), stage IV 2014    a. Baseline Cr reported 2.5-2.6.  Marland Kitchen Anemia   . Urinary tract infection   . Colon cancer 1980s  . Chronic atrial fibrillation     a. not on anticoagulation due to increased risk of falls, advanced age.  . Chronic diastolic CHF (congestive heart failure)     a. Echo 10/2013: mild LVH, EF 60-65%, mod AS, mildly dilated LA, mod dilated RA, PASP 33.  Marland Kitchen Hypertension   . Carotid disease, bilateral     a. Carotid duplex 09/6281: 15-17% RICA/LICA.  Marland Kitchen H/O cardiovascular stress test     a. 2002: nuc negative for ischemia, EF 65%.  . Severe aortic stenosis 08/23/2014  . Depression with anxiety     situational  . Headache   . Arthritis   . Recurrent pleural effusion on right     s/p multiple taps prior to Pleurex catheter placement  . Choledocholithiasis with acute cholecystitis 06/14/2014  . Clostridium difficile diarrhea 09/2014  . Type II diabetes mellitus with complication 09/07/735    Retinopathy,  nepropathy.   . Thrombocytopenia 08/29/2014  . Cholecystitis 10/2014    Past Surgical History  Procedure Laterality Date  . Abdominal hysterectomy    . Colon surgery  2002    right colectomy for recurrent cancer  . Appendectomy    . Colostomy  1980s    low anterior resection of rectal cancer.   . Cardioversion  09/30/2011    Procedure: CARDIOVERSION;  Surgeon: [name], MD;  Location: [place];  Service:  Cardiovascular;  Laterality: N/A;  . I&d extremity  11/20/2011    Procedure: IRRIGATION AND DEBRIDEMENT EXTREMITY;  Surgeon: Alta Corning, MD;  Location: [place];  Service: Orthopedics;  Laterality: Left;  . Av fistula placement Left 06/02/2012    Procedure: ARTERIOVENOUS (AV) FISTULA CREATION;  Surgeon: Conrad De Leon, MD;  Location: [place];  Service: Vascular;  Laterality: Left;  Bascilic Fistula  . Bascilic vein transposition Left 08/18/2012    Procedure: BASCILIC VEIN TRANSPOSITION;  Surgeon: Conrad [place]ville, MD;  Location: [place];  Service: Vascular;  Laterality: Left;  Left 2nd stage Basilic Vein Transposition   . Ercp N/A 07/11/2014    Procedure: ENDOSCOPIC RETROGRADE CHOLANGIOPANCREATOGRAPHY (ERCP);  Surgeon: Inda Castle, MD;  [facility];  Service: Endoscopy;  Laterality: N/A;  . Biliary stent placement N/A 07/11/2014    Procedure: BILIARY STENT PLACEMENT;  Surgeon: Inda Castle, MD;  Location: [facility];  Service: Endoscopy;  Laterality: N/A;  . Ercp N/A 07/21/2014    Procedure: ENDOSCOPIC RETROGRADE CHOLANGIOPANCREATOGRAPHY (ERCP);  Surgeon: Inda Castle, MD;  Location: [facility];  Service: Endoscopy;  Laterality: N/A;  . Fracture surgery      Right leg  . Chest tube insertion Right 09/12/2014    Procedure: INSERTION PLEURAL DRAINAGE CATHETER;  Surgeon: Melrose Nakayama, MD;  Location: [place];  Service: Thoracic;  Laterality: Right;  . Ct perc cholecystostomy  05/11/2014  . Pleural effusion drainage  09/12/2014  . Cholecystectomy N/A 10/26/2014    Procedure: OPEN CHOLECYSTECTOMY;  Surgeon: Rolm Bookbinder, MD;  Location: [place];  Service: General;  Laterality: N/A;    Social History:  reports that she has never smoked. Her smokeless tobacco use includes Chew. She reports that she does not drink alcohol or use illicit drugs.  Family History:  Family History  Problem Relation Age of Onset  . Heart disease Father   . Diabetes Sister   . Diabetes Daughter   .  Hyperlipidemia Daughter   . Hypertension Daughter   . Other Daughter     varicose veins  . Cancer Son   . Diabetes Son   . Heart disease Son     before age 13  . Hyperlipidemia Son   . Hypertension Son   . Heart attack Father   . Stroke Father      Prior to Admission medications   Medication Sig Start Date End Date Taking? Authorizing Provider  acetaminophen (TYLENOL) 500 MG tablet Take 500 mg by mouth every 6 (six) hours as needed for mild pain.     Historical Provider, MD  aspirin EC 81 MG tablet Take 81 mg by mouth at bedtime.     Historical Provider, MD  calcitRIOL (ROCALTROL) 0.25 MCG capsule Take 0.25 mcg by mouth daily.  04/06/12   Historical Provider, MD  carvedilol (COREG) 25 MG tablet Take 25 mg by mouth 2 (two) times daily. 09/15/14   Historical Provider, MD  carvedilol (COREG) 25 MG tablet TAKE 1 TABLET (25 MG TOTAL) BY  MOUTH 2 (TWO) TIMES DAILY WITH A MEAL. 11/10/14   [name], MD  clotrimazole-betamethasone (LOTRISONE) lotion Apply 1 application topically daily as needed (apply to rash on stomach as needed).  05/25/14   Historical Provider, MD  docusate sodium (COLACE) 100 MG capsule Take 1 capsule (100 mg total) by mouth daily. 11/01/14   Ripudeep Krystal Eaton, MD  epoetin alfa (EPOGEN,PROCRIT) 69485 UNIT/ML injection 20,000 Units. Every two weeks    Historical Provider, MD  furosemide (LASIX) 80 MG tablet Take 1 tablet (80 mg total) by mouth 2 (two) times daily. 09/08/14   [name], MD  insulin glargine (LANTUS) 100 UNIT/ML injection Inject 8 Units into the skin at bedtime as needed (if cbg over 200).     Historical Provider, MD  levalbuterol Penne Lash) 0.63 MG/3ML nebulizer solution Take 3 mLs (0.63 mg total) by nebulization every 8 (eight) hours as needed for wheezing or shortness of breath. 11/01/14   Ripudeep Krystal Eaton, MD  linagliptin (TRADJENTA) 5 MG TABS tablet Take 5 mg by mouth daily.     Historical Provider, MD  LORazepam (ATIVAN) 0.5 MG tablet Take 1 tablet (0.5 mg  total) by mouth 2 (two) times daily. 11/01/14   Ripudeep Krystal Eaton, MD  meclizine (ANTIVERT) 12.5 MG tablet Take 12.5 mg by mouth as needed for dizziness.  11/23/12   Historical Provider, MD  mirtazapine (REMERON) 7.5 MG tablet Take 1 tablet (7.5 mg total) by mouth at bedtime. 11/01/14   Ripudeep Krystal Eaton, MD  promethazine (PHENERGAN) 12.5 MG tablet Take 1 tablet (12.5 mg total) by mouth every 6 (six) hours as needed for nausea or vomiting. 03/14/14   Ripudeep Krystal Eaton, MD  saccharomyces boulardii (FLORASTOR) 250 MG capsule Take 1 capsule (250 mg total) by mouth 2 (two) times daily. 09/29/14   Florencia Reasons, MD  simvastatin (ZOCOR) 20 MG tablet TAKE 1 TABLET (20 MG TOTAL) BY MOUTH AT BEDTIME. 08/28/14   [name], MD  traMADol (ULTRAM) 50 MG tablet Take 1 tablet (50 mg total) by mouth every 6 (six) hours as needed for moderate pain. 11/01/14   Ripudeep Krystal Eaton, MD  vitamin B-12 (CYANOCOBALAMIN) 1000 MCG tablet Take 1,000 mcg by mouth daily.    Historical Provider, MD    Physical Exam: Filed Vitals:   11/15/14 0033 11/15/14 0100 11/15/14 0200 11/15/14 0400  BP: 166/61 145/65 171/68 163/66  Pulse: 70 54 58 55  Temp: 98 F (36.7 C)     TempSrc: Oral     Resp: 21 19 19 17   Height:      Weight:      SpO2: 96% 100% 100% 100%   General: Not in acute distress HEENT:       Eyes: PERRL, EOMI, no scleral icterus.       ENT: No discharge from the ears and nose, no pharynx injection, no tonsillar enlargement.        Neck: positive JVD, no bruit, no mass felt. Heme: No neck lymph node enlargement. Cardiac: S1/S2, RRR, No murmurs, No gallops or rubs. Pulm: has diffused rales bilaterally, No wheezing, rhonchi or rubs. Abd: Soft, nondistended, nontender, no rebound pain, no organomegaly, BS present. Has colostomy on the left side of abdomen with mild skin irritation in surrounding area. Ext: 3+pitting leg edema bilaterally. 2+DP/PT pulse bilaterally. Musculoskeletal: No joint deformities, No joint redness or warmth, no  limitation of ROM in spin. Skin: No rashes.  Neuro: Alert, oriented X3, cranial nerves II-XII grossly intact, moves all  extremities. Psych: Patient is not psychotic, no suicidal or hemocidal ideation.  Labs on Admission:  Basic Metabolic Panel:  Recent Labs Lab 11/14/14 2000  NA 127*  K 5.1  CL 91*  CO2 25  GLUCOSE 124*  BUN 75*  CREATININE 4.31*  CALCIUM 8.2*   Liver Function Tests: No results for input(s): AST, ALT, ALKPHOS, BILITOT, PROT, ALBUMIN in the last 168 hours. No results for input(s): LIPASE, AMYLASE in the last 168 hours. No results for input(s): AMMONIA in the last 168 hours. CBC:  Recent Labs Lab 11/14/14 2000  WBC 9.9  NEUTROABS 8.0*  HGB 9.7*  HCT 30.4*  MCV 89.4  PLT 202   Cardiac Enzymes:  Recent Labs Lab 11/14/14 2000  TROPONINI <0.03    BNP (last 3 results)  Recent Labs  03/15/14 0914 07/14/14 1125 11/14/14 2009  BNP 941.4* 526.1* 1462.2*    ProBNP (last 3 results)  Recent Labs  09/08/14 1517  PROBNP 312.0*    CBG: No results for input(s): GLUCAP in the last 168 hours.  Radiological Exams on Admission: Dg Chest Port 1 View  11/14/2014   CLINICAL DATA:  Shortness of breath and coughing.  Wheezing  EXAM: PORTABLE CHEST - 1 VIEW  COMPARISON:  10/28/2014  FINDINGS: There is mild cardiac enlargement. Increased in bilateral pleural effusions and interstitial edema. There is been worsening aeration to both lower lobes right greater than left.  IMPRESSION: 1. Interval progression of CHF pattern.   Electronically Signed   By: Kerby Moors M.D.   On: 11/14/2014 20:15    EKG: Independently reviewed.  Abnormal findings: QTC 468, atrial fibrillation, widening QRS wave  Assessment/Plan Principal Problem:   Acute on chronic respiratory failure with hypoxia Active Problems:   Hypertension   Chronic diastolic CHF (congestive heart failure)   Severe aortic stenosis   Type 2 diabetes mellitus with diabetic chronic kidney disease    Acute on chronic diastolic CHF (congestive heart failure)   Pleural effusion   DNR (do not resuscitate)   Palliative care encounter   SOB (shortness of breath)   Acute renal failure superimposed on stage 4 chronic kidney disease   Atrial fibrillation   CHF exacerbation   Diarrhea  Acute on chronic respiratory failure with hypoxia due to CHF exacerbation: 2-D echo on 08/23/14 showed EF of 65-70%. Patient is on high-dose Lasix 80 mg twice a day at home, now presents with worsening leg edema, shortness of breath and elevated BNP, consistent with CHF exacerbation. Her worsening renal function may have also contributed to fluid overload.  -will admit to SDU -will treat with IV lasix 120 mg bid -ASA , coreg -will cycle CE X3 -will get 2-D echo to evaluate EF -will get EKG -strict In/Out -Daily body weight. -heart diet -No on ACEI because of CKD -Xopenex Nebs prn  AoCKD-IV: Baseline Cre is 3.0-3.5, her Cre is 4.31, BUN 75 on admission. Likely due to cardiorenal syndrome and continuation of diruetics. - Check FeUrea - US-renal - Follow up renal function by BMP - will be on high dose of lasix  for CHFas above, with understanding that this may worsen her renal function. However, I am hoping this will not happen due to the Starling mechanism. Her kidney may be better perfused with improved cardiac function  HTN: -On Coreg, Lasix  DM-II: Last A1c 6.5 on 09/22/14, well controled. Patient is taking Tradjenta and Lantus at home -will decrease Lantus dose from  8-6 units daily -SSI  Atrial Fibrillation: CHA2DS2-VASc  Score is 5, needs oral anticoagulation. Patient is not AC due to high risk of fall. Heart rate is well controlled. -continue coreg  Diarrhea:  -check c diff pcr  HLD: Last LDL was 29 on 06/15/14 -Continue home medications: Zocor  DVT ppx: SQ Heparin    Code Status: DNR Family Communication: Yes, patient's 2 daughters at bed side Disposition Plan: Admit to inpatient   Date  of Service 11/15/2014    Ivor Costa [facility] Pager [phone]  If 7PM-7AM, please contact night-coverage [email or web address] Password [email or web address] 11/15/2014, 4:08 AM

## 2014-11-14 NOTE — Telephone Encounter (Signed)
[name] (daughter) says her mom is not eating anything, coughing a lot.  Home health nurse hears wheezing in the right lung again.  Tube was removed on 11/07/14.   Patient scheduled to see Dr. Lake Bells tomorrow. Nothing further needed.

## 2014-11-14 NOTE — ED Notes (Signed)
Pt has c/o diarrhea from colostomy and has been changing bags several times.

## 2014-11-14 NOTE — ED Notes (Signed)
Pt arrives from home via [facility]. Pt has c/o sob. Pt states she had a chest tube removed last week. Pt has a hx of pleural effusion and chf. Pt states she is supposed to be on dialysis, but doesn't wish to have anymore dialysis and has signed a DNR. Pt on 2L [place] at home.

## 2014-11-15 ENCOUNTER — Inpatient Hospital Stay (HOSPITAL_COMMUNITY): Payer: Medicare Other

## 2014-11-15 ENCOUNTER — Ambulatory Visit (HOSPITAL_COMMUNITY): Payer: Medicare Other | Attending: Internal Medicine

## 2014-11-15 ENCOUNTER — Ambulatory Visit: Payer: Medicare Other | Admitting: Pulmonary Disease

## 2014-11-15 DIAGNOSIS — N184 Chronic kidney disease, stage 4 (severe): Secondary | ICD-10-CM

## 2014-11-15 DIAGNOSIS — I517 Cardiomegaly: Secondary | ICD-10-CM | POA: Insufficient documentation

## 2014-11-15 DIAGNOSIS — E119 Type 2 diabetes mellitus without complications: Secondary | ICD-10-CM | POA: Insufficient documentation

## 2014-11-15 DIAGNOSIS — L8990 Pressure ulcer of unspecified site, unspecified stage: Secondary | ICD-10-CM | POA: Insufficient documentation

## 2014-11-15 DIAGNOSIS — I509 Heart failure, unspecified: Secondary | ICD-10-CM

## 2014-11-15 DIAGNOSIS — I340 Nonrheumatic mitral (valve) insufficiency: Secondary | ICD-10-CM | POA: Insufficient documentation

## 2014-11-15 DIAGNOSIS — I350 Nonrheumatic aortic (valve) stenosis: Secondary | ICD-10-CM | POA: Insufficient documentation

## 2014-11-15 DIAGNOSIS — I071 Rheumatic tricuspid insufficiency: Secondary | ICD-10-CM | POA: Insufficient documentation

## 2014-11-15 DIAGNOSIS — I10 Essential (primary) hypertension: Secondary | ICD-10-CM | POA: Insufficient documentation

## 2014-11-15 DIAGNOSIS — I5033 Acute on chronic diastolic (congestive) heart failure: Secondary | ICD-10-CM

## 2014-11-15 DIAGNOSIS — R197 Diarrhea, unspecified: Secondary | ICD-10-CM | POA: Diagnosis present

## 2014-11-15 DIAGNOSIS — J9621 Acute and chronic respiratory failure with hypoxia: Secondary | ICD-10-CM

## 2014-11-15 DIAGNOSIS — N179 Acute kidney failure, unspecified: Secondary | ICD-10-CM

## 2014-11-15 LAB — GLUCOSE, CAPILLARY
GLUCOSE-CAPILLARY: 93 mg/dL (ref 65–99)
Glucose-Capillary: 103 mg/dL — ABNORMAL HIGH (ref 65–99)
Glucose-Capillary: 115 mg/dL — ABNORMAL HIGH (ref 65–99)
Glucose-Capillary: 110 mg/dL — ABNORMAL HIGH (ref 65–99)

## 2014-11-15 LAB — CREATININE, URINE, RANDOM: Creatinine, Urine: 64.93 mg/dL

## 2014-11-15 LAB — APTT: aPTT: 48 seconds — ABNORMAL HIGH (ref 24–37)

## 2014-11-15 LAB — PROTIME-INR
INR: 1.35 (ref 0.00–1.49)
PROTHROMBIN TIME: 16.8 s — AB (ref 11.6–15.2)

## 2014-11-15 LAB — MRSA PCR SCREENING: MRSA BY PCR: NEGATIVE

## 2014-11-15 MED ORDER — SODIUM CHLORIDE 0.9 % IV SOLN: 250.0000 mL | INTRAVENOUS | Status: DC | PRN

## 2014-11-15 MED ORDER — ACETAMINOPHEN 325 MG PO TABS: 650.0000 mg | ORAL_TABLET | ORAL | Status: DC | PRN

## 2014-11-15 MED ORDER — TRAMADOL HCL 50 MG PO TABS: 50.0000 mg | ORAL_TABLET | Freq: Four times a day (QID) | ORAL | Status: DC | PRN

## 2014-11-15 MED ORDER — MIRTAZAPINE 7.5 MG PO TABS
7.5000 mg | ORAL_TABLET | Freq: Every day | ORAL | Status: DC
  Administered 2014-11-15 – 2014-11-16 (×3): 7.5 mg via ORAL
  Filled 2014-11-15 (×5): qty 1

## 2014-11-15 MED ORDER — CLOTRIMAZOLE-BETAMETHASONE 1-0.05 % EX LOTN: 1.0000 "application " | TOPICAL_LOTION | Freq: Every day | CUTANEOUS | Status: DC | PRN

## 2014-11-15 MED ORDER — CALCITRIOL 0.25 MCG PO CAPS
0.2500 ug | ORAL_CAPSULE | Freq: Every day | ORAL | Status: DC
  Administered 2014-11-15 – 2014-11-16 (×2): 0.25 ug via ORAL
  Filled 2014-11-15 (×2): qty 1

## 2014-11-15 MED ORDER — PROMETHAZINE HCL 25 MG PO TABS: 12.5000 mg | ORAL_TABLET | Freq: Four times a day (QID) | ORAL | Status: DC | PRN

## 2014-11-15 MED ORDER — ASPIRIN EC 81 MG PO TBEC: 81.0000 mg | DELAYED_RELEASE_TABLET | Freq: Every day | ORAL | Status: DC

## 2014-11-15 MED ORDER — SACCHAROMYCES BOULARDII 250 MG PO CAPS
250.0000 mg | ORAL_CAPSULE | Freq: Two times a day (BID) | ORAL | Status: DC
  Administered 2014-11-15 – 2014-11-16 (×3): 250 mg via ORAL
  Filled 2014-11-15 (×3): qty 1

## 2014-11-15 MED ORDER — INSULIN ASPART 100 UNIT/ML ~~LOC~~ SOLN
0.0000 [IU] | Freq: Three times a day (TID) | SUBCUTANEOUS | Status: DC
Start: 2014-11-15 — End: 2014-11-16
  Administered 2014-11-16: 1 [IU] via SUBCUTANEOUS

## 2014-11-15 MED ORDER — MECLIZINE HCL 12.5 MG PO TABS: 12.5000 mg | ORAL_TABLET | Freq: Two times a day (BID) | ORAL | Status: DC | PRN

## 2014-11-15 MED ORDER — CETYLPYRIDINIUM CHLORIDE 0.05 % MT LIQD
7.0000 mL | Freq: Two times a day (BID) | OROMUCOSAL | Status: DC
  Administered 2014-11-15 – 2014-11-17 (×5): 7 mL via OROMUCOSAL

## 2014-11-15 MED ORDER — CARVEDILOL 25 MG PO TABS
25.0000 mg | ORAL_TABLET | Freq: Two times a day (BID) | ORAL | Status: DC
  Administered 2014-11-15 – 2014-11-16 (×3): 25 mg via ORAL
  Filled 2014-11-15 (×3): qty 1

## 2014-11-15 MED ORDER — SODIUM CHLORIDE 0.9 % IJ SOLN: 3.0000 mL | INTRAMUSCULAR | Status: DC | PRN

## 2014-11-15 MED ORDER — LORAZEPAM 0.5 MG PO TABS
0.5000 mg | ORAL_TABLET | Freq: Two times a day (BID) | ORAL | Status: DC
  Administered 2014-11-15 – 2014-11-17 (×6): 0.5 mg via ORAL
  Filled 2014-11-15 (×6): qty 1

## 2014-11-15 MED ORDER — SODIUM CHLORIDE 0.9 % IJ SOLN
3.0000 mL | Freq: Two times a day (BID) | INTRAMUSCULAR | Status: DC
  Administered 2014-11-15 – 2014-11-17 (×6): 3 mL via INTRAVENOUS

## 2014-11-15 MED ORDER — DOCUSATE SODIUM 100 MG PO CAPS: 100.0000 mg | ORAL_CAPSULE | Freq: Every day | ORAL | Status: DC | PRN

## 2014-11-15 MED ORDER — CLOTRIMAZOLE 1 % EX CREA
TOPICAL_CREAM | Freq: Every day | CUTANEOUS | Status: DC | PRN
Start: 2014-11-15 — End: 2014-11-17

## 2014-11-15 MED ORDER — LEVALBUTEROL HCL 0.63 MG/3ML IN NEBU: 0.6300 mg | INHALATION_SOLUTION | Freq: Four times a day (QID) | RESPIRATORY_TRACT | Status: DC | PRN

## 2014-11-15 MED ORDER — MECLIZINE HCL 12.5 MG PO TABS: 12.5000 mg | ORAL_TABLET | ORAL | Status: DC | PRN

## 2014-11-15 MED ORDER — ONDANSETRON HCL 4 MG/2ML IJ SOLN
4.0000 mg | Freq: Four times a day (QID) | INTRAMUSCULAR | Status: DC | PRN
  Administered 2014-11-15: 4 mg via INTRAVENOUS
  Filled 2014-11-15: qty 2

## 2014-11-15 MED ORDER — INSULIN GLARGINE 100 UNIT/ML ~~LOC~~ SOLN: 6.0000 [IU] | Freq: Every evening | SUBCUTANEOUS | Status: DC | PRN

## 2014-11-15 MED ORDER — VITAMIN B-12 1000 MCG PO TABS
1000.0000 ug | ORAL_TABLET | Freq: Every day | ORAL | Status: DC
  Administered 2014-11-15 – 2014-11-16 (×2): 1000 ug via ORAL
  Filled 2014-11-15 (×2): qty 1

## 2014-11-15 MED ORDER — ACETAMINOPHEN 325 MG PO TABS: 650.0000 mg | ORAL_TABLET | Freq: Four times a day (QID) | ORAL | Status: DC | PRN

## 2014-11-15 MED ORDER — DEXTROSE 5 % IV SOLN
120.0000 mg | Freq: Two times a day (BID) | INTRAVENOUS | Status: DC
  Administered 2014-11-15 – 2014-11-16 (×3): 120 mg via INTRAVENOUS
  Filled 2014-11-15 (×4): qty 12

## 2014-11-15 MED ORDER — FUROSEMIDE 10 MG/ML IJ SOLN
40.0000 mg | Freq: Once | INTRAMUSCULAR | Status: AC
  Administered 2014-11-15: 40 mg via INTRAVENOUS
  Filled 2014-11-15: qty 4

## 2014-11-15 MED ORDER — HEPARIN SODIUM (PORCINE) 5000 UNIT/ML IJ SOLN
5000.0000 [IU] | Freq: Three times a day (TID) | INTRAMUSCULAR | Status: DC
  Administered 2014-11-15 – 2014-11-16 (×4): 5000 [IU] via SUBCUTANEOUS
  Filled 2014-11-15 (×4): qty 1

## 2014-11-15 MED ORDER — SIMVASTATIN 20 MG PO TABS
20.0000 mg | ORAL_TABLET | Freq: Every day | ORAL | Status: DC
  Administered 2014-11-15: 20 mg via ORAL
  Filled 2014-11-15: qty 1

## 2014-11-15 MED ORDER — ASPIRIN EC 81 MG PO TBEC
81.0000 mg | DELAYED_RELEASE_TABLET | Freq: Every day | ORAL | Status: DC
  Administered 2014-11-15 – 2014-11-17 (×3): 81 mg via ORAL
  Filled 2014-11-15 (×3): qty 1

## 2014-11-15 MED ORDER — GUAIFENESIN-DM 100-10 MG/5ML PO SYRP
5.0000 mL | ORAL_SOLUTION | ORAL | Status: DC | PRN
  Administered 2014-11-15: 5 mL via ORAL
  Filled 2014-11-15: qty 5

## 2014-11-15 NOTE — Progress Notes (Signed)
*  PRELIMINARY RESULTS* Echocardiogram 2D Echocardiogram has been performed.  Leavy Cella 11/15/2014, 11:27 AM

## 2014-11-15 NOTE — Progress Notes (Signed)
S: She was admitted overnight for shortness of breath. Admission labs were notable for creatinine 4.3, up from her baseline 3-4, BUN 75. CXR notable for opacities and pleural effusions bilaterally, so she was started on Lasix 120mg  IV twice daily in an attempt to diurese her.   At bedside, she reports having a persistent cough since discharge from her most recent hospitalization earlier this month for open cholecystectomy. During this interval, she also reports that she cannot perform ADLs, like cooking, toileting, bathing, without the help from someone from [facility] and that her appetite and energy have been worsening. She reports that she has been taking Lasix 80mg  twice daily though has also noted decreased urine output. She would like to go home feeling better and reports not wearing oxygen at home all the time. She otherwise denies any nausea, vomiting, diarrhea, sick contacts.   At her last hospitalization, she was seen by her nephrologist, Dr. Moshe Cipro, as a consult [10/29/14] who assessed her not be a suitable candidate for dialysis given her functional decline over the past year.    O:BP 136/76 mmHg  Pulse 57  Temp(Src) 98.1 F (36.7 C) (Oral)  Resp 17  Ht 5\' 4"  (1.626 m)  Wt 176 lb 5.9 oz (80 kg)  BMI 30.26 kg/m2  SpO2 100%  Intake/Output Summary (Last 24 hours) at 11/15/14 1652 Last data filed at 11/15/14 1345  Gross per 24 hour  Intake    182 ml  Output    240 ml  Net    -58 ml   Intake/Output: I/O last 3 completed shifts: In: 76 [IV Piggyback:62] Out: 240 [Urine:240]  Intake/Output this shift:  Total I/O In: 120 [P.O.:120] Out: -  Weight change:   General: elderly Caucasian female, 3.5L O2 by [place], resting in bed, no acute distress HEENT: PERRL, EOMI, no scleral icterus, oropharynx clear Cardiac: irregular rate with systolic ejection murmur best auscultated at the RUSB Pulm: clear to auscultation bilaterally in the anterior lung fields Abd: RUQ surgical scar  noted consistent with prior procedure, colostomy in place, no tenderness on palpation of all four quadrants Ext: 2+ pitting edema in bilateral LE below the level of the knee Neuro: responds to questions appropriately; moving all extremities freely    Recent Labs Lab 11/14/14 2000  NA 127*  K 5.1  CL 91*  CO2 25  GLUCOSE 124*  BUN 75*  CREATININE 4.31*  CALCIUM 8.2*   CBC:  Recent Labs Lab 11/14/14 2000  WBC 9.9  NEUTROABS 8.0*  HGB 9.7*  HCT 30.4*  MCV 89.4  PLT 202   Cardiac Enzymes:  Recent Labs Lab 11/14/14 2000  TROPONINI <0.03   CBG:  Recent Labs Lab 11/15/14 0804 11/15/14 1217  GLUCAP 103* 115*    Iron Studies: No results for input(s): IRON, TIBC, TRANSFERRIN, FERRITIN in the last 72 hours. Studies/Results: US Renal  11/15/2014   CLINICAL DATA:  Acute renal injury  EXAM: RENAL / URINARY TRACT ULTRASOUND COMPLETE  COMPARISON:  10/24/2014  FINDINGS: Right Kidney:  Length: 10.2 cm. Diffuse cortical thinning is noted. No mass lesion or hydronephrosis is noted.  Left Kidney:  Length: 10.2 cm. Diffuse cortical thinning is noted. 814 mm cyst is noted in the upper pole.  Bladder:  Decompressed by Foley catheter.  IMPRESSION: Small left renal cyst.  Diffuse bilateral cortical thinning.   Electronically Signed   By: Inez Catalina M.D.   On: 11/15/2014 07:44   Dg Chest Port 1 View  11/14/2014   CLINICAL  DATA:  Shortness of breath and coughing.  Wheezing  EXAM: PORTABLE CHEST - 1 VIEW  COMPARISON:  10/28/2014  FINDINGS: There is mild cardiac enlargement. Increased in bilateral pleural effusions and interstitial edema. There is been worsening aeration to both lower lobes right greater than left.  IMPRESSION: 1. Interval progression of CHF pattern.   Electronically Signed   By: Kerby Moors M.D.   On: 11/14/2014 20:15   . antiseptic oral rinse  7 mL Mouth Rinse BID  . aspirin EC  81 mg Oral Daily  . calcitRIOL  0.25 mcg Oral Daily  . carvedilol  25 mg Oral BID WC  .  furosemide  120 mg Intravenous Q12H  . heparin  5,000 Units Subcutaneous 3 times per day  . insulin aspart  0-9 Units Subcutaneous TID WC  . LORazepam  0.5 mg Oral BID  . mirtazapine  7.5 mg Oral QHS  . saccharomyces boulardii  250 mg Oral BID  . simvastatin  20 mg Oral q1800  . sodium chloride  3 mL Intravenous Q12H  . vitamin B-12  1,000 mcg Oral Daily    BMET    Component Value Date/Time   NA 127* 11/14/2014 2000   K 5.1 11/14/2014 2000   CL 91* 11/14/2014 2000   CO2 25 11/14/2014 2000   GLUCOSE 124* 11/14/2014 2000   BUN 75* 11/14/2014 2000   CREATININE 4.31* 11/14/2014 2000   CALCIUM 8.2* 11/14/2014 2000   CALCIUM 8.9 10/20/2014 1526   GFRNONAA 8* 11/14/2014 2000   GFRAA 10* 11/14/2014 2000   CBC    Component Value Date/Time   WBC 9.9 11/14/2014 2000   RBC 3.40* 11/14/2014 2000   RBC 3.54* 10/14/2009 0043   HGB 9.7* 11/14/2014 2000   HCT 30.4* 11/14/2014 2000   PLT 202 11/14/2014 2000   MCV 89.4 11/14/2014 2000   MCH 28.5 11/14/2014 2000   MCHC 31.9 11/14/2014 2000   RDW 15.5 11/14/2014 2000   LYMPHSABS 1.0 11/14/2014 2000   MONOABS 0.9 11/14/2014 2000   EOSABS 0.0 11/14/2014 2000   BASOSABS 0.0 11/14/2014 2000     Assessment/Plan:  1. Oliguric acute on chronic Stage 4 kidney disease: Suspect her acute respiratory failure is is a progression of her underlying kidney disease and is likely ESRD as reflected by the progression of her symptoms noted above along with CXR and physical exam findings. No new findings noted on repeat echo and renal US. Conversations with NP Vinie Sill [facility]] on 09/27/14 notable for the patient willing to consider interventions like TAVR even if it resulted in her demise which was suggestive of poor insight into her medical condition though she is now prioritizing comfort above all else and is in agreement with our recommendation of NOT pursuing hemodialysis. Palliative Care consult would be helpful to arrange for home hospice.   2. Severe aortic stenosis 3. Diastolic heart failure 4. Chronic a-fib 5. H/o colon cancer s/p resection and colostomy  6. CAD 7. H/o cholecystitis s/p open removal 8. Depression 9. DM2 10. Hyperlipidemia  Charlott Rakes  I have seen and examined this patient and agree with plan as outlined by Dr. Posey Pronto.  I have reviewed her recent hospitalization records as well as the Nephrology consults including those from her primary Nephrologist, Dr. Moshe Cipro and I concur with the assessment that she is not a suitable candidate for dialysis given her significant decline in functional status as well as her irreversible valvular and kidney disease.  Dialysis would not  improve her quality of life nor her ability to resume her ADL's.  She does not want hospice because "they don't feed you or do anything for you" but agrees to no dialysis and understands her condition.  Agree with palliative care consult as well as patient education on the actual role and duties of hospice to clarify any misunderstanding.  Nothing further to add.  Will sign off, please call with questions or concerns. Kimmy Parish A,MD 11/16/2014 7:30 AM

## 2014-11-15 NOTE — Consult Note (Signed)
   [facility] Inpatient Consult   11/15/2014  MYKEL MOHL 1926/02/07 [id number] Referral received from [facility] community nurse for notification of the patient's admission.  Patient is recent active [prior to admission]  with [facility] for chronic disease management services.  Patient has been engaged by a [employer] in the Transition of Care telephone follow up.  Patient had declined home visits as she had a nurse coming from [place] but continued with the [facility] program.   Our community based plan of care has focused on disease management for HF follow up.  Patient will receive a post discharge transition of care call and will be evaluated for monthly home visits for assessments and disease process education.  Will alert Inpatient Case Manager that [facility] is following. Of note, [facility] Management services does not replace or interfere with any services that are arranged by inpatient case management or social work.  For additional questions or referrals please contact: Natividad Brood, RN BSN [facility] Liaison  [phone] business mobile phone

## 2014-11-15 NOTE — Progress Notes (Addendum)
[facility] PROGRESS NOTE  [name] CXK:[id number] DOB: 06-13-1925 DOA: 11/14/2014 PCP: Abigail Miyamoto, MD  Assessment/Plan: 1. Acute on chronic resp failure -due to pulm edema from CKD 4, severe AS -continue high dose IV lasix, i tried to emphasize to the patient that i didn't think she would be a good HD candidate -Renal consult -monitor Urine output and weights -Palliative consult for goals of care  2. AKi on CKD 4 -as above, ? Suspect a component of cardiorenal syndrome as well -High dose IV Lasix as above, discussed with Renal, Dr.Colodonato for consult  3. Severe symptomatic AS -Mean gradient (S): 34 mm Hg. -Followed by cardiology not considered a candidate for valve replacement or TAVR  4. Type 2 diabetes  -Oral hypoglycemics held, sliding-scale insulin, last HbA1c was 6.5   5.   history of recurrent pleural effusions  -Had a Pleurx catheter which was just removed by Dr. Roxan Hockey few weeks ago due to decreased output   6. History of cholecystitis status post recent open cholecystectomy   7. History of chronic atrial fibrillation  -Rate controlled, continue Coreg, aspirin, not considered a candidate for anticoagulation due to falls per cardiology notes   8. Hx of colon cancer -s/p R-hemicolectomy 2002 with ostomy  DVT prophylaxis with heparin SQ   Code Status: DNR Family Communication: none at bedside, called and d/w daughter in room Disposition Plan: keep in SDU   Consultants:  Renal  Palliative  HPI/Subjective: Still with some dyspnea  Objective: Filed Vitals:   11/15/14 1217  BP: 156/56  Pulse: 56  Temp: 98.1 F (36.7 C)  Resp: 26    Intake/Output Summary (Last 24 hours) at 11/15/14 1353 Last data filed at 11/15/14 0629  Gross per 24 hour  Intake     62 ml  Output    240 ml  Net   -178 ml   Filed Weights   11/14/14 1854 11/15/14 0031 11/15/14 0500  Weight: 74.39 kg (164 lb) 80 kg (176 lb 5.9 oz) 80 kg (176 lb 5.9 oz)     Exam:   General:  AAOx3  Cardiovascular: S1S2/RRR, SE murmur  Respiratory: diffuse basilar crackles  Abdomen: soft, NT, BS present  Musculoskeletal: 1 plus edema   Data Reviewed: Basic Metabolic Panel:  Recent Labs Lab 11/14/14 2000  NA 127*  K 5.1  CL 91*  CO2 25  GLUCOSE 124*  BUN 75*  CREATININE 4.31*  CALCIUM 8.2*   Liver Function Tests: No results for input(s): AST, ALT, ALKPHOS, BILITOT, PROT, ALBUMIN in the last 168 hours. No results for input(s): LIPASE, AMYLASE in the last 168 hours. No results for input(s): AMMONIA in the last 168 hours. CBC:  Recent Labs Lab 11/14/14 2000  WBC 9.9  NEUTROABS 8.0*  HGB 9.7*  HCT 30.4*  MCV 89.4  PLT 202   Cardiac Enzymes:  Recent Labs Lab 11/14/14 2000  TROPONINI <0.03   BNP (last 3 results)  Recent Labs  03/15/14 0914 07/14/14 1125 11/14/14 2009  BNP 941.4* 526.1* 1462.2*    ProBNP (last 3 results)  Recent Labs  09/08/14 1517  PROBNP 312.0*    CBG:  Recent Labs Lab 11/15/14 0804 11/15/14 1217  GLUCAP 103* 115*    Recent Results (from the past 240 hour(s))  MRSA PCR Screening     Status: None   Collection Time: 11/15/14 12:36 AM  Result Value Ref Range Status   MRSA by PCR NEGATIVE NEGATIVE Final    Comment:  The GeneXpert MRSA Assay (FDA approved for NASAL specimens only), is one component of a comprehensive MRSA colonization surveillance program. It is not intended to diagnose MRSA infection nor to guide or monitor treatment for MRSA infections.      Studies: US Renal  11/15/2014   CLINICAL DATA:  Acute renal injury  EXAM: RENAL / URINARY TRACT ULTRASOUND COMPLETE  COMPARISON:  10/24/2014  FINDINGS: Right Kidney:  Length: 10.2 cm. Diffuse cortical thinning is noted. No mass lesion or hydronephrosis is noted.  Left Kidney:  Length: 10.2 cm. Diffuse cortical thinning is noted. 814 mm cyst is noted in the upper pole.  Bladder:  Decompressed by Foley catheter.   IMPRESSION: Small left renal cyst.  Diffuse bilateral cortical thinning.   Electronically Signed   By: Inez Catalina M.D.   On: 11/15/2014 07:44   Dg Chest Port 1 View  11/14/2014   CLINICAL DATA:  Shortness of breath and coughing.  Wheezing  EXAM: PORTABLE CHEST - 1 VIEW  COMPARISON:  10/28/2014  FINDINGS: There is mild cardiac enlargement. Increased in bilateral pleural effusions and interstitial edema. There is been worsening aeration to both lower lobes right greater than left.  IMPRESSION: 1. Interval progression of CHF pattern.   Electronically Signed   By: Kerby Moors M.D.   On: 11/14/2014 20:15    Scheduled Meds: . antiseptic oral rinse  7 mL Mouth Rinse BID  . aspirin EC  81 mg Oral Daily  . calcitRIOL  0.25 mcg Oral Daily  . carvedilol  25 mg Oral BID WC  . furosemide  120 mg Intravenous Q12H  . heparin  5,000 Units Subcutaneous 3 times per day  . insulin aspart  0-9 Units Subcutaneous TID WC  . LORazepam  0.5 mg Oral BID  . mirtazapine  7.5 mg Oral QHS  . saccharomyces boulardii  250 mg Oral BID  . simvastatin  20 mg Oral q1800  . sodium chloride  3 mL Intravenous Q12H  . vitamin B-12  1,000 mcg Oral Daily   Continuous Infusions:  Antibiotics Given (last 72 hours)    None      Principal Problem:   Acute on chronic respiratory failure with hypoxia Active Problems:   Hypertension   Chronic diastolic CHF (congestive heart failure)   Severe aortic stenosis   Type 2 diabetes mellitus with diabetic chronic kidney disease   Acute on chronic diastolic CHF (congestive heart failure)   Pleural effusion   DNR (do not resuscitate)   Palliative care encounter   SOB (shortness of breath)   Acute renal failure superimposed on stage 4 chronic kidney disease   Atrial fibrillation   CHF exacerbation   Diarrhea   Pressure ulcer    Time spent: 68min    [facility] Pager [phone]. If 7PM-7AM, please contact night-coverage at [email or web address], password  [facility] 11/15/2014, 1:53 PM  LOS: 1 day

## 2014-11-15 NOTE — Progress Notes (Signed)
[facility]  Patient Status: Active (receiving services up to time of hospitalization)  [facility] is providing the following services: RN, PT and MSW  If patient discharges after hours, please call [phone].   [name] 11/15/2014, 9:51 AM

## 2014-11-16 LAB — GLUCOSE, CAPILLARY
GLUCOSE-CAPILLARY: 84 mg/dL (ref 65–99)
GLUCOSE-CAPILLARY: 122 mg/dL — AB (ref 65–99)

## 2014-11-16 LAB — BASIC METABOLIC PANEL
ANION GAP: 13 (ref 5–15)
BUN: 79 mg/dL — ABNORMAL HIGH (ref 6–20)
CHLORIDE: 92 mmol/L — AB (ref 101–111)
CO2: 23 mmol/L (ref 22–32)
Calcium: 8 mg/dL — ABNORMAL LOW (ref 8.9–10.3)
Creatinine, Ser: 4.33 mg/dL — ABNORMAL HIGH (ref 0.44–1.00)
GFR calc non Af Amer: 8 mL/min — ABNORMAL LOW (ref >60)
GFR, EST AFRICAN AMERICAN: 10 mL/min — AB (ref >60)
Glucose, Bld: 81 mg/dL (ref 65–99)
Potassium: 4.9 mmol/L (ref 3.5–5.1)
SODIUM: 128 mmol/L — AB (ref 135–145)

## 2014-11-16 LAB — UREA NITROGEN, URINE: UREA NITROGEN UR: 248 mg/dL

## 2014-11-16 MED ORDER — HYDROMORPHONE HCL 2 MG PO TABS
2.0000 mg | ORAL_TABLET | ORAL | Status: DC | PRN
  Administered 2014-11-16: 2 mg via ORAL
  Filled 2014-11-16: qty 1

## 2014-11-16 MED ORDER — LORAZEPAM 1 MG PO TABS: 1.0000 mg | ORAL_TABLET | ORAL | Status: DC | PRN

## 2014-11-16 MED ORDER — HYDROMORPHONE HCL 2 MG PO TABS: 2.0000 mg | ORAL_TABLET | ORAL | Status: DC | PRN

## 2014-11-16 MED ORDER — CARVEDILOL 12.5 MG PO TABS: 12.5000 mg | ORAL_TABLET | Freq: Two times a day (BID) | ORAL | Status: DC

## 2014-11-16 MED ORDER — LORAZEPAM 2 MG/ML PO CONC: 1.0000 mg | ORAL | Status: DC | PRN

## 2014-11-16 MED ORDER — GLYCOPYRROLATE 1 MG PO TABS
1.0000 mg | ORAL_TABLET | ORAL | Status: DC | PRN
  Administered 2014-11-17: 1 mg via ORAL
  Filled 2014-11-16 (×3): qty 1

## 2014-11-16 MED ORDER — HYDROMORPHONE HCL 1 MG/ML PO LIQD: 2.0000 mg | ORAL | Status: DC | PRN

## 2014-11-16 NOTE — Progress Notes (Signed)
OT Cancellation Note  Patient Details Name: [name] MRN: [id number] DOB: 02/27/1926   Cancelled Treatment:    Reason Eval/Treat Not Completed: OT screened, no needs identified, will sign off. Family to take pt home with goal of comfort care.   Malka So 11/16/2014, 2:55 PM  [phone]

## 2014-11-16 NOTE — Care Management Note (Signed)
Case Management Note  Patient Details  Name: [name] MRN: [id number] Date of Birth: October 22, 1925  Subjective/Objective:     Pt to d/c home with home hospice and family has chosen [facility].  Referral called and documentation faxed to [facility] who will coordinate delivery of hospital bed and O2 from hospice provider with dtr, [name] [phone]).               Expected Discharge Plan:  [place]  Discharge planning Services  CM Consult  Post Acute Care Choice:  Durable Medical Equipment, Hospice Choice offered to:  Adult Children  DME Arranged:  Hospital bed DME Agency:  Other - Comment  HH Arranged:  RN [facility] Agency:  [facility]  Status of Service:  Completed, signed off  Girard Cooter, [place] 11/16/2014, 2:30 PM

## 2014-11-16 NOTE — Evaluation (Signed)
Physical Therapy Evaluation Patient Details Name: [name] MRN: [id number] DOB: 14-Dec-1925 Today's Date: 11/16/2014   History of Present Illness  81 y.o. female with PMH of chronic respiratory failure on 2 L oxygen at home, hyperlipidemia, diabetes mellitus, depression, CKD-IV, chronic recurrent pleural effusion (just removed chest tube on 11/07/14), colon cancer (s/p of chemotherapy and surgery), colostomy, atrial fibrillation not on anticoagulations due to high risk of fall, diastolic congestive heart failure (EF 65-70%), bilateral carotid artery disease, who presents with SOB and worsening leg edema.  Clinical Impression  Pt with flat affect and appears very apathetic stating she doesn't care what she does. Pt stating she doesn't know where she wants to go at D/C and it will be up to her family, not currently present during eval. Pt able to transfer OOB without physical assist but denied attempting further activity. Pt educated for benefit of mobility, gait and exercise and encouraged to perform with nursing. Pt will benefit from acute therapy to maximize mobility, gait and function to decrease burden of care.     Follow Up Recommendations SNF;Supervision for mobility/OOB    Equipment Recommendations  None recommended by PT    Recommendations for Other Services       Precautions / Restrictions Precautions Precautions: Fall Restrictions Weight Bearing Restrictions: No      Mobility  Bed Mobility Overal bed mobility: Needs Assistance Bed Mobility: Supine to Sit     Supine to sit: Supervision     General bed mobility comments: cues for sequence and encouragement to initiate with use of rail   Transfers Overall transfer level: Needs assistance   Transfers: Sit to/from Stand;Stand Pivot Transfers Sit to Stand: Min guard Stand pivot transfers: Min guard       General transfer comment: cues for hand placement, pt able to stand from bed and pivot to recliner but denied  attempts at ambulation, further transfers or HEP  Ambulation/Gait Ambulation/Gait assistance:  (pt denied attempting)              Stairs            Wheelchair Mobility    Modified Rankin (Stroke Patients Only)       Balance Overall balance assessment: Needs assistance   Sitting balance-Leahy Scale: Good       Standing balance-Leahy Scale: Fair                               Pertinent Vitals/Pain Pain Assessment: No/denies pain  HR 64 sats 99% on 2L    Home Living Family/patient expects to be discharged to:: Private residence Living Arrangements: Children Available Help at Discharge: Family Type of Home: House Home Access: Ramped entrance     Home Layout: Two level;Able to live on main level with bedroom/bathroom Home Equipment: Gilford Rile - 2 wheels;Walker - 4 wheels;Bedside commode;Shower seat;Cane - single point Additional Comments: Pt reports daughter cannot physically help due to daughter's own back issues.     Prior Function Level of Independence: Needs assistance   Gait / Transfers Assistance Needed: pt reporting she was walking with rollator  ADL's / Homemaking Assistance Needed: states dgtr had started to assist her with bathing and dressing  Comments: home setup and function different from what pt reported 2 weeks ago.      Hand Dominance        Extremity/Trunk Assessment   Upper Extremity Assessment: Generalized weakness  Lower Extremity Assessment: RLE deficits/detail;LLE deficits/detail;Overall [facility] for tasks assessed RLE Deficits / Details: hip flexion 4/5, knee flexion 4/5, knee extension 5/5 LLE Deficits / Details: hip flexion 4/5, knee flexion 4/5, knee extension 5/5  Cervical / Trunk Assessment: Normal  Communication   Communication: No difficulties  Cognition Arousal/Alertness: Awake/alert Behavior During Therapy: Flat affect Overall Cognitive Status: Impaired/Different from baseline Area of  Impairment: Orientation;Safety/judgement Orientation Level: Time       Safety/Judgement: Decreased awareness of deficits          General Comments      Exercises        Assessment/Plan    PT Assessment Patient needs continued PT services  PT Diagnosis Difficulty walking   PT Problem List Decreased activity tolerance;Decreased balance;Decreased mobility;Decreased knowledge of use of DME  PT Treatment Interventions DME instruction;Gait training;Stair training;Functional mobility training;Therapeutic activities;Therapeutic exercise;Balance training;Neuromuscular re-education;Patient/family education   PT Goals (Current goals can be found in the Care Plan section) Acute Rehab PT Goals Patient Stated Goal: "return home if that's what my family wants" PT Goal Formulation: With patient Time For Goal Achievement: 11/30/14 Potential to Achieve Goals: Fair    Frequency Min 3X/week   Barriers to discharge Decreased caregiver support dgtr with recent back surgery and unable to physically assist    Co-evaluation               End of Session   Activity Tolerance: Patient limited by fatigue Patient left: in chair;with call bell/phone within reach Nurse Communication: Mobility status         Time: 4132-4401 PT Time Calculation (min) (ACUTE ONLY): 15 min   Charges:   PT Evaluation $Initial PT Evaluation Tier I: 1 Procedure     PT G CodesMelford Aase 11/16/2014, 9:34 AM Elwyn Reach, [place]

## 2014-11-16 NOTE — Clinical Documentation Improvement (Signed)
OB/GYN   Please document diagnosis being treated:  Abnormal Lab/Test Results: Hgb/Hct: 8i/23 7.8/22.4 to 5.2/14.6  Possible Clinical Conditions associated with below indicators Acute blood loss anemia Other Condition Cannot Clinically Determine  Supporting Information: --  S/p c-section with "postpartum hemorrhage" --  Treated with 2u PRBC on admit, 4u in OR, 2u FFP, 2u PRBC 8/24 --   Monitoring CBC, Bakri balloon insertion in OR post D&C  Please exercise your independent, professional judgment when responding. A specific answer is not anticipated or expected.   Thank You,  [place]

## 2014-11-16 NOTE — Progress Notes (Addendum)
[facility] PROGRESS NOTE  TABITA CORBO YTK:[id number] DOB: 1925-06-28 DOA: 11/14/2014 PCP: Abigail Miyamoto, MD  Assessment/Plan: 1. Acute on chronic resp failure -due to pulm edema from CKD 4, severe AS -on high dose IV lasix, with poor response -Renal consulted, nothing to offer and not a HD candidate, this was reiterated during last admission too but patient kept going back and forth about this. -monitor Urine output and weights, only negative 600cc, on 120mg  IV lasix q12 -Palliative consulted for goals of care, i discussed very poor prognosis with patient and daughter yesterday and how her options are extremely limited  2. AKi on CKD 4 -as above, creatinine in mid 4range now -with some symptoms of uremia -palliative consult  3. Severe symptomatic AS -Mean gradient (S): 37 mm Hg. -Followed by cardiology 2 weeks ago not considered a candidate for valve replacement or TAVR  4. Type 2 diabetes  -Oral hypoglycemics held, sliding-scale insulin, last HbA1c was 6.5   5.   history of recurrent pleural effusions  -Had a Pleurx catheter which was just removed by Dr. Roxan Hockey few weeks ago due to decreased output   6. History of cholecystitis status post recent open cholecystectomy   7. History of chronic atrial fibrillation  -Rate controlled, continue Coreg, aspirin, not considered a candidate for anticoagulation due to falls per cardiology notes   8. Hx of colon cancer -s/p R-hemicolectomy 2002 with ostomy  GLOBAL: Very poor prognosis with advanced age, frequent hospitalizations (5 in last 6 months), severe/critical aortic stenosis, chronic kidney disease stage IV with worsening, poor Po intake and overall failure to thrive. Discussed with patient and one daughter yesterday about the fact that she is not a dialysis candidate and we would need to focus on comfort based management. Palliative consulted for goals of care  DVT prophylaxis with heparin SQ   Code Status:  DNR Family Communication: none at bedside, called and d/w daughter in room 8/24 Disposition Plan: transfer to tele   Consultants:  Renal  Palliative  HPI/Subjective: Still with some dyspnea  Objective: Filed Vitals:   11/16/14 0800  BP: 106/59  Pulse: 55  Temp: 97.6 F (36.4 C)  Resp: 14    Intake/Output Summary (Last 24 hours) at 11/16/14 1045 Last data filed at 11/16/14 0800  Gross per 24 hour  Intake    244 ml  Output    725 ml  Net   -481 ml   Filed Weights   11/15/14 0031 11/15/14 0500 11/16/14 0600  Weight: 80 kg (176 lb 5.9 oz) 80 kg (176 lb 5.9 oz) 79 kg (174 lb 2.6 oz)    Exam:   General:  AAOx3  Cardiovascular: S1S2/RRR, loud SEM murmur  Respiratory: diffuse basilar crackles  Abdomen: soft, NT, BS present  Musculoskeletal: 1 plus edema   Data Reviewed: Basic Metabolic Panel:  Recent Labs Lab 11/14/14 2000 11/16/14 0435  NA 127* 128*  K 5.1 4.9  CL 91* 92*  CO2 25 23  GLUCOSE 124* 81  BUN 75* 79*  CREATININE 4.31* 4.33*  CALCIUM 8.2* 8.0*   Liver Function Tests: No results for input(s): AST, ALT, ALKPHOS, BILITOT, PROT, ALBUMIN in the last 168 hours. No results for input(s): LIPASE, AMYLASE in the last 168 hours. No results for input(s): AMMONIA in the last 168 hours. CBC:  Recent Labs Lab 11/14/14 2000  WBC 9.9  NEUTROABS 8.0*  HGB 9.7*  HCT 30.4*  MCV 89.4  PLT 202   Cardiac Enzymes:  Recent Labs  Lab 11/14/14 2000  TROPONINI <0.03   BNP (last 3 results)  Recent Labs  03/15/14 0914 07/14/14 1125 11/14/14 2009  BNP 941.4* 526.1* 1462.2*    ProBNP (last 3 results)  Recent Labs  09/08/14 1517  PROBNP 312.0*    CBG:  Recent Labs Lab 11/15/14 0804 11/15/14 1217 11/15/14 1726 11/15/14 2141 11/16/14 0800  GLUCAP 103* 115* 110* 93 84    Recent Results (from the past 240 hour(s))  MRSA PCR Screening     Status: None   Collection Time: 11/15/14 12:36 AM  Result Value Ref Range Status   MRSA by  PCR NEGATIVE NEGATIVE Final    Comment:        The GeneXpert MRSA Assay (FDA approved for NASAL specimens only), is one component of a comprehensive MRSA colonization surveillance program. It is not intended to diagnose MRSA infection nor to guide or monitor treatment for MRSA infections.      Studies: US Renal  11/15/2014   CLINICAL DATA:  Acute renal injury  EXAM: RENAL / URINARY TRACT ULTRASOUND COMPLETE  COMPARISON:  10/24/2014  FINDINGS: Right Kidney:  Length: 10.2 cm. Diffuse cortical thinning is noted. No mass lesion or hydronephrosis is noted.  Left Kidney:  Length: 10.2 cm. Diffuse cortical thinning is noted. 814 mm cyst is noted in the upper pole.  Bladder:  Decompressed by Foley catheter.  IMPRESSION: Small left renal cyst.  Diffuse bilateral cortical thinning.   Electronically Signed   By: Inez Catalina M.D.   On: 11/15/2014 07:44   Dg Chest Port 1 View  11/14/2014   CLINICAL DATA:  Shortness of breath and coughing.  Wheezing  EXAM: PORTABLE CHEST - 1 VIEW  COMPARISON:  10/28/2014  FINDINGS: There is mild cardiac enlargement. Increased in bilateral pleural effusions and interstitial edema. There is been worsening aeration to both lower lobes right greater than left.  IMPRESSION: 1. Interval progression of CHF pattern.   Electronically Signed   By: Kerby Moors M.D.   On: 11/14/2014 20:15    Scheduled Meds: . antiseptic oral rinse  7 mL Mouth Rinse BID  . aspirin EC  81 mg Oral Daily  . calcitRIOL  0.25 mcg Oral Daily  . carvedilol  25 mg Oral BID WC  . furosemide  120 mg Intravenous Q12H  . heparin  5,000 Units Subcutaneous 3 times per day  . insulin aspart  0-9 Units Subcutaneous TID WC  . LORazepam  0.5 mg Oral BID  . mirtazapine  7.5 mg Oral QHS  . saccharomyces boulardii  250 mg Oral BID  . simvastatin  20 mg Oral q1800  . sodium chloride  3 mL Intravenous Q12H  . vitamin B-12  1,000 mcg Oral Daily   Continuous Infusions:  Antibiotics Given (last 72 hours)     None      Principal Problem:   Acute on chronic respiratory failure with hypoxia Active Problems:   Hypertension   Chronic diastolic CHF (congestive heart failure)   Severe aortic stenosis   Type 2 diabetes mellitus with diabetic chronic kidney disease   Acute on chronic diastolic CHF (congestive heart failure)   Pleural effusion   DNR (do not resuscitate)   Palliative care encounter   SOB (shortness of breath)   Acute renal failure superimposed on stage 4 chronic kidney disease   Atrial fibrillation   CHF exacerbation   Diarrhea   Pressure ulcer    Time spent: 32min    [facility]  [facility] Pager  [phone]. If 7PM-7AM, please contact night-coverage at [email or web address], password [facility] 11/16/2014, 10:45 AM  LOS: 2 days

## 2014-11-16 NOTE — Consult Note (Addendum)
Met with family to discuss goals of care.  End Stage Cardio-Renal Disease Severe Aortic Stenosis SCr 4.6 and rising  1. No hemodialysis 2. Full comfort care- allow for a natural and peaceful death to occur 3. Family and patient feel strongly that they desire a home death 4. Please make referral to [place]- she will need ambulance transport home and appropriate DM.  She needs to be discharged with:  1. Hydromorphone Liquid 2mg  q2 PRN dyspnea and pain 2. Lorazepam Intensol 1mg  q4 PRN agitation 3. Robinul 0.1 tabs q4PRN secetions 4. Home O2 for comfort 5. Hospital bed, concentrator and DME for in home EOL care.  Prognosis: <2 weeks  Time: 1;00-1:30PM Total: 30 minutes,Greater than 50%  of this time was spent counseling and coordinating care related to the above assessment and plan.  Lane Hacker, DO Palliative Medicine [phone]

## 2014-11-16 NOTE — Consult Note (Signed)
   [facility] Inpatient Consult   11/16/2014  [name] 04/07/1925 [id number] Chart review reveals that the patient will discharge home with Hospice care.  [facility] Care Management will sign off at this time for care management services.  Will notify community team of current disposition and plan for Hospice. Natividad Brood, RN BSN [facility] Liaison  [phone] business mobile phone

## 2014-11-16 NOTE — Progress Notes (Signed)
   11/16/14 1400  Clinical Encounter Type  Visited With Patient and family together  Visit Type Initial;Spiritual support;Social support;Patient actively dying  Referral From Palliative care team   Chaplain was referred to patient via spiritual care consult. Chaplain visited with patient and patient's family for 15 minutes this afternoon. Patient was able to acknowledge the chaplain's presence but was in and out of sleep. Patient's family said that a member of the patient's church stopped by and had a wonderful moment of prayer with them before the chaplain arrived. Patient is being transferred to hospice later. Chaplain let patient's family know he is available as an additional resource until patient is discharged. Chaplain will continue to provide emotional and spiritual support for patient and patient's family as needed.  Everlie Eble, Claudius Sis, Chaplain  2:40 PM

## 2014-11-17 ENCOUNTER — Encounter (HOSPITAL_COMMUNITY): Payer: Medicare Other

## 2014-11-17 DIAGNOSIS — N179 Acute kidney failure, unspecified: Secondary | ICD-10-CM | POA: Insufficient documentation

## 2014-11-17 MED ORDER — GLYCOPYRROLATE 1 MG PO TABS: 1.0000 mg | ORAL_TABLET | ORAL | Status: AC | PRN

## 2014-11-17 MED ORDER — SALINE SPRAY 0.65 % NA SOLN
1.0000 | NASAL | Status: DC | PRN
  Administered 2014-11-17: 1 via NASAL
  Filled 2014-11-17: qty 44

## 2014-11-17 MED ORDER — HYDROMORPHONE HCL 1 MG/ML PO LIQD: 2.0000 mg | ORAL | Status: AC

## 2014-11-17 MED ORDER — LORAZEPAM 1 MG PO TABS: 1.0000 mg | ORAL_TABLET | ORAL | Status: AC | PRN

## 2014-11-17 NOTE — Discharge Summary (Signed)
Physician Discharge Summary  MANNA GOSE SNK:[id number] DOB: [birth year] DOA: 11/14/2014  PCP: Abigail Miyamoto, MD  Admit date: 11/14/2014 Discharge date: 11/17/2014  Time spent: 45 minutes  Recommendations for Outpatient Follow-up:  1. COMFORT Measures only, [facility]  Discharge Diagnoses:  Principal Problem:   Acute on chronic respiratory failure with hypoxia Active Problems:   Hypertension   Chronic diastolic CHF (congestive heart failure)   Severe aortic stenosis   Type 2 diabetes mellitus with diabetic chronic kidney disease   Acute on chronic diastolic CHF (congestive heart failure)   Pleural effusion   DNR (do not resuscitate)   Palliative care encounter   SOB (shortness of breath)   Acute renal failure superimposed on stage 4 chronic kidney disease   Atrial fibrillation   CHF exacerbation   Diarrhea   Pressure ulcer   AKI (acute kidney injury)   Discharge Condition: poor  Diet recommendation: comfort feeds  Filed Weights   11/15/14 0031 11/15/14 0500 11/16/14 0600  Weight: 80 kg (176 lb 5.9 oz) 80 kg (176 lb 5.9 oz) 79 kg (174 lb 2.6 oz)    History of present illness:  Chief Complaint: Shortness of breath and worsening leg edema HPI: [name] is a 81 y.o. female with PMH of chronic respiratory failure on 2 L oxygen at home, hyperlipidemia, diabetes mellitus, depression, CKD-IV, chronic recurrent pleural effusion (just removed chest tube on 11/07/14), colon cancer (s/p of chemotherapy and surgery), colostomy, atrial fibrillation not on anticoagulations due to high risk of fall, diastolic congestive heart failure (EF 65-70%), bilateral carotid artery disease, who presents with SOB and worsening leg edema.  Hospital Course:  1. Acute on chronic resp failure -due to pulm edema from CKD 4, severe AS -was started on high dose IV lasix, with poor response, Renal consulted, nothing to offer and not a HD candidate -GLOBAL: Very poor prognosis with  advanced age, frequent hospitalizations (5 in last 6 months), severe/critical aortic stenosis, chronic kidney disease stage IV with worsening, poor Po intake and overall failure to thrive. -subsequently Palliative consulted for goals of care,  Given very poor prognosis, decision was made after goals of care meeting to focus on cofmort alone. -She is discharged home with family with [facility], prognosis felt to be few weeks  2. AKi on CKD 4 -as above, creatinine in mid 4range now -with some symptoms of uremia, not a candidate for Dialysis -palliative consulted, now comfort care  3. Severe symptomatic AS -Mean gradient (S): 37 mm Hg. -Followed by cardiology 2 weeks ago not considered a candidate for valve replacement or TAVR -now comfort care  4. Type 2 diabetes  -meds stopped   5. history of recurrent pleural effusions  -Had a Pleurx catheter which was just removed by Dr. Roxan Hockey few weeks ago due to decreased output   6. History of cholecystitis status post recent open cholecystectomy   7. History of chronic atrial fibrillation  -Rate controlled, continue Coreg, aspirin, not considered a candidate for anticoagulation due to falls per cardiology notes   8. Hx of colon cancer -s/p R-hemicolectomy 2002 with ostomy    Consultations:  Palliative  Renal  Discharge Exam: Filed Vitals:   11/17/14 1116  BP: 154/45  Pulse: 62  Temp: 97.9 F (36.6 C)  Resp: 16    General: AAOX3 Cardiovascular:S1S2/RRR Respiratory: poor air movement  Discharge Instructions   Discharge Instructions    Diet - low sodium heart healthy    Complete by:  As directed      For home use only DME oxygen    Complete by:  As directed   Mode or (Route):  Nasal cannula  Liters per Minute:  2  Frequency:  Continuous (stationary and portable oxygen unit needed)  Oxygen conserving device:  Yes  Oxygen delivery system:  Gas     Increase activity slowly    Complete by:  As directed            Discharge Medication List as of 11/17/2014  1:16 PM    START taking these medications   Details  glycopyrrolate (ROBINUL) 1 MG tablet Take 1 tablet (1 mg total) by mouth every 4 (four) hours as needed (for UPPER AIRWAY SECRETIONS/CONGESTION)., Starting 11/17/2014, Until Discontinued, Print    HYDROmorphone HCl (DILAUDID) 1 MG/ML LIQD Take 2 mLs (2 mg total) by mouth every 2 (two) hours., Starting 11/17/2014, Until Discontinued, Print    !! LORazepam (ATIVAN) 1 MG tablet Take 1 tablet (1 mg total) by mouth every 4 (four) hours as needed for anxiety., Starting 11/17/2014, Until Discontinued, Print     !! - Potential duplicate medications found. Please discuss with provider.    CONTINUE these medications which have NOT CHANGED   Details  acetaminophen (TYLENOL) 500 MG tablet Take 500-1,000 mg by mouth every 6 (six) hours as needed for mild pain or fever. , Until Discontinued, Historical Med    !! LORazepam (ATIVAN) 0.5 MG tablet Take 1 tablet (0.5 mg total) by mouth 2 (two) times daily., Starting 11/01/2014, Until Discontinued, Print    promethazine (PHENERGAN) 12.5 MG tablet Take 1 tablet (12.5 mg total) by mouth every 6 (six) hours as needed for nausea or vomiting., Starting 03/14/2014, Until Discontinued, Print     !! - Potential duplicate medications found. Please discuss with provider.    STOP taking these medications     aspirin EC 81 MG tablet      calcitRIOL (ROCALTROL) 0.25 MCG capsule      carvedilol (COREG) 25 MG tablet      clotrimazole-betamethasone (LOTRISONE) lotion      docusate sodium (COLACE) 100 MG capsule      epoetin alfa (EPOGEN,PROCRIT) 27253 UNIT/ML injection      furosemide (LASIX) 80 MG tablet      insulin glargine (LANTUS) 100 UNIT/ML injection      levalbuterol (XOPENEX) 0.63 MG/3ML nebulizer solution      linagliptin (TRADJENTA) 5 MG TABS tablet      meclizine (ANTIVERT) 12.5 MG tablet      mirtazapine (REMERON) 7.5 MG tablet       saccharomyces boulardii (FLORASTOR) 250 MG capsule      simvastatin (ZOCOR) 20 MG tablet      traMADol (ULTRAM) 50 MG tablet      vitamin B-12 (CYANOCOBALAMIN) 1000 MCG tablet        Allergies  Allergen Reactions  . Levaquin [Levofloxacin] Nausea Only  . Codeine Nausea Only  . Keflet [Cephalexin] Nausea Only  . Morphine And Related Nausea Only  . Oxycodone Other (See Comments)    Abnormal behavior   Follow-up Information    Follow up with [facility].   Specialty:  Home Health Services   Contact information:   [place] [address] [phone]        The results of significant diagnostics from this hospitalization (including imaging, microbiology, ancillary and laboratory) are listed below for reference.    Significant Diagnostic Studies: Ct Abdomen Pelvis Wo  Contrast  10/24/2014   CLINICAL DATA:  81 year old female with right flank/right upper quadrant pain for 2 days. Nausea and vomiting. Drain in place to drain fluid from lungs and abdomen. Subsequent encounter.  EXAM: CT ABDOMEN AND PELVIS WITHOUT CONTRAST  TECHNIQUE: Multidetector CT imaging of the abdomen and pelvis was performed following the standard protocol without IV contrast.  COMPARISON:  10/24/2014 plain film exam.  09/22/2014 CT.  FINDINGS: Inferior right chest tube is in place with decrease in size but incomplete clearance of right-sided hydro pneumothorax.  Very small left-sided pleural effusion with basilar atelectasis.  Enlarged gallbladder contains small calcified stones with surrounding inflammation suggestive of acute cholecystitis.  Pneumobilia once again noted in the common bile duct which may be related to prior sphincterotomy. No calcified common bile duct stone visualized.  Post colectomy with surgical clips surrounding the cecum/right colon and colostomy left lower abdomen with large parastomal hernia containing small and large bowel. Previously stomach partially entered the  hernia but has been reduced. No free intraperitoneal air.  Cardiomegaly. Mitral valve and aortic valve calcifications. Coronary artery calcifications.  Atherosclerotic type changes aorta with mild ectasia. Atherosclerotic type changes iliac arteries.  Right kidney rotated anteriorly. No renal or ureteral obstructing stone. Left upper pole 1.2 cm cyst. Calcification left hilum may be vascular in origin.  Taking into account limitation by non contrast imaging, no worrisome hepatic, splenic, pancreatic or adrenal lesion.  Post hysterectomy.  No urinary bladder abnormality noted.  Heterogeneous bone marrow as previously noted. Cannot exclude metastatic disease myeloma. Schmorl's node deformities. Spinal stenosis most notable L4-5.  Scattered normal to top-normal size lymph nodes.  IMPRESSION: Enlarged gallbladder contains small calcified stones with surrounding inflammation suggestive of acute cholecystitis.  Pneumobilia once again noted in the common bile duct which may be related to prior sphincterotomy. No calcified common bile duct stone visualized.  Colostomy left lower abdomen with large parastomal hernia containing small and large bowel.  Inferior right chest tube is in place with decrease in size but incomplete clearance of right-sided hydro pneumothorax.  Very small left-sided pleural effusion with basilar atelectasis.  Cardiomegaly. Mitral valve and aortic valve calcifications. Coronary artery calcifications.  Atherosclerotic type changes aorta, aortic branch vessels and iliac arteries.  Heterogeneous bone marrow as previously noted. Cannot exclude metastatic disease or myeloma. Schmorl's node deformities. Spinal stenosis most notable L4-5.  These results were called by telephone at the time of interpretation on 10/24/2014 at 7:10 am to Dr. Mingo Amber , who verbally acknowledged these results.   Electronically Signed   By: Genia Del M.D.   On: 10/24/2014 07:22   Dg Chest 2 View  11/07/2014   CLINICAL DATA:   Followup right pleural effusion, chest tube with drainage  EXAM: CHEST  2 VIEW  COMPARISON:  Chest x-ray of 10/31/2014  FINDINGS: The right PleurX catheter remains in place with a persistent small right pleural effusion and right basilar atelectasis. The left lung is clear. Cardiomegaly is stable. There are degenerative changes throughout the thoracic spine.  IMPRESSION: 1. Small right pleural effusion remains with right PleurX catheter in place. Right basilar atelectasis is noted. 2. Stable cardiomegaly.   Electronically Signed   By: Ivar Drape M.D.   On: 11/07/2014 14:13   US Renal  11/15/2014   CLINICAL DATA:  Acute renal injury  EXAM: RENAL / URINARY TRACT ULTRASOUND COMPLETE  COMPARISON:  10/24/2014  FINDINGS: Right Kidney:  Length: 10.2 cm. Diffuse cortical thinning is noted. No mass lesion or hydronephrosis is  noted.  Left Kidney:  Length: 10.2 cm. Diffuse cortical thinning is noted. 814 mm cyst is noted in the upper pole.  Bladder:  Decompressed by Foley catheter.  IMPRESSION: Small left renal cyst.  Diffuse bilateral cortical thinning.   Electronically Signed   By: Inez Catalina M.D.   On: 11/15/2014 07:44   Dg Chest Port 1 View  11/14/2014   CLINICAL DATA:  Shortness of breath and coughing.  Wheezing  EXAM: PORTABLE CHEST - 1 VIEW  COMPARISON:  10/28/2014  FINDINGS: There is mild cardiac enlargement. Increased in bilateral pleural effusions and interstitial edema. There is been worsening aeration to both lower lobes right greater than left.  IMPRESSION: 1. Interval progression of CHF pattern.   Electronically Signed   By: Kerby Moors M.D.   On: 11/14/2014 20:15   Dg Chest Port 1 View  10/31/2014   CLINICAL DATA:  Right pleural effusion.  EXAM: PORTABLE CHEST - 1 VIEW  COMPARISON:  10/30/2014.  CT 08/30/2014.  FINDINGS: Right chest tube in stable position. No pneumothorax. Mediastinum and hilar structures are normal. Cardiomegaly with diffuse bilateral from interstitial prominence and bilateral  effusions, right side greater than left. Findings suggest congestive heart failure. Interstitial pneumonitis cannot be excluded.  IMPRESSION: 1. Right chest tube in stable position.  No pneumothorax. 2. Cardiomegaly with diffuse bilateral pulmonary interstitial prominence and bilateral effusions consistent with congestive heart failure.   Electronically Signed   By: Marcello Moores  Register   On: 10/31/2014 07:57   Dg Chest Port 1 View  10/28/2014   CLINICAL DATA:  81 year old female with pleural effusion.  EXAM: PORTABLE CHEST - 1 VIEW  COMPARISON:  Chest x-ray 10/27/2014.  FINDINGS: Lung volumes are low. Extensive bibasilar opacities (right greater than left) may reflect areas of atelectasis and/or consolidation. Small to moderate left and moderate right-sided pleural effusions. Previously noted pleural fluid in the apex of the right hemithorax has redistributed. Small bore right-sided chest tube again noted, tip of which appears to extend into the apex of the right hemithorax. No cephalization of the pulmonary vasculature. Heart size appears mildly enlarged. The patient is rotated to the left on today's exam, resulting in distortion of the mediastinal contours and reduced diagnostic sensitivity and specificity for mediastinal pathology. Atherosclerosis in the thoracic aorta.  IMPRESSION: 1. Decreased size and read distribution of moderate right pleural effusion. 2. Persistent low lung volumes with bibasilar areas of atelectasis and/or consolidation. 3. Mild cardiomegaly. 4. Atherosclerosis.   Electronically Signed   By: Vinnie Langton M.D.   On: 10/28/2014 08:55   Dg Chest Port 1 View  10/27/2014   CLINICAL DATA:  Pleural effusion.  EXAM: PORTABLE CHEST - 1 VIEW  COMPARISON:  10/24/2014 .  CT 08/30/2014.  FINDINGS: Right chest tube in stable position . No pneumothorax on today's exam. Cardiomegaly with bilateral pulmonary interstitial prominence and bilateral pleural effusions, right side greater than left noted.  Right apical pleural thickening noted consistent with pleural effusion.  IMPRESSION: 1. Right chest tube in stable position. No pneumothorax noted on today's exam. 2. Congestive heart failure with bilateral pulmonary interstitial edema and bilateral pleural effusion. Right pleural effusion is prominent.   Electronically Signed   By: Marcello Moores  Register   On: 10/27/2014 07:39   Dg Abd Acute W/chest  10/24/2014   CLINICAL DATA:  RIGHT lower quadrant pain beginning 2 days ago, nausea and vomiting. Pleural effusion.  EXAM: DG ABDOMEN ACUTE W/ 1V CHEST  COMPARISON:  Chest radiograph October 03, 2014  FINDINGS: RIGHT pleural fat extending to the lung apex, with decrease, smaller loose decidual pleural effusion. Cardiac silhouette is moderately enlarged unchanged. Calcified aortic knob. Similar chronic moderate bronchitic changes without focal consolidation. No pneumothorax. Soft tissue planes included osseous structure nonsuspicious, mild degenerative change of the thoracic spine.  Paucity of bowel gas. Surgical clips along LEFT abdomen associated with apparent large hernia, the lateral aspect not fully imaged. The hernia sac appears to contain multiple loops of bowel. No intra-abdominal mass effect or pathologic calcifications. No intraperitoneal free air on the decubitus view. Moderate degenerative change of the lumbar spine.  IMPRESSION: RIGHT chest tube, small residual RIGHT pleural effusion, improved.  Stable cardiomegaly and moderate chronic bronchitic changes.  Large suspected LEFT lateral abdominal wall hernia containing bowel without bowel obstruction.   Electronically Signed   By: Elon Alas M.D.   On: 10/24/2014 05:16    Microbiology: Recent Results (from the past 240 hour(s))  MRSA PCR Screening     Status: None   Collection Time: 11/15/14 12:36 AM  Result Value Ref Range Status   MRSA by PCR NEGATIVE NEGATIVE Final    Comment:        The GeneXpert MRSA Assay (FDA approved for NASAL  specimens only), is one component of a comprehensive MRSA colonization surveillance program. It is not intended to diagnose MRSA infection nor to guide or monitor treatment for MRSA infections.      Labs: Basic Metabolic Panel:  Recent Labs Lab 11/14/14 2000 11/16/14 0435  NA 127* 128*  K 5.1 4.9  CL 91* 92*  CO2 25 23  GLUCOSE 124* 81  BUN 75* 79*  CREATININE 4.31* 4.33*  CALCIUM 8.2* 8.0*   Liver Function Tests: No results for input(s): AST, ALT, ALKPHOS, BILITOT, PROT, ALBUMIN in the last 168 hours. No results for input(s): LIPASE, AMYLASE in the last 168 hours. No results for input(s): AMMONIA in the last 168 hours. CBC:  Recent Labs Lab 11/14/14 2000  WBC 9.9  NEUTROABS 8.0*  HGB 9.7*  HCT 30.4*  MCV 89.4  PLT 202   Cardiac Enzymes:  Recent Labs Lab 11/14/14 2000  TROPONINI <0.03   BNP: BNP (last 3 results)  Recent Labs  03/15/14 0914 07/14/14 1125 11/14/14 2009  BNP 941.4* 526.1* 1462.2*    ProBNP (last 3 results)  Recent Labs  09/08/14 1517  PROBNP 312.0*    CBG:  Recent Labs Lab 11/15/14 1217 11/15/14 1726 11/15/14 2141 11/16/14 0800 11/16/14 1133  GLUCAP 115* 110* 93 84 122*       Signed:  Vianne Grieshop  [facility] 11/17/2014, 2:47 PM

## 2014-11-17 NOTE — Progress Notes (Signed)
Daily Progress Note   Patient Name: [name]       Date: 11/17/2014 DOB: [birth year]  Age: 81 y.o. MRN#: [id number] Attending Physician: Domenic Polite, MD Primary Care Physician: Abigail Miyamoto, MD Admit Date: 11/14/2014  Reason for Consultation/Follow-up: Establishing goals of care  Subjective: [name] looks much more alert today. She slept very well last PM. No complaints.  Interval Events: PMT consult 8/25 Length of Stay: 3 days  Current Medications: Scheduled Meds:  . antiseptic oral rinse  7 mL Mouth Rinse BID  . aspirin EC  81 mg Oral Daily  . LORazepam  0.5 mg Oral BID  . mirtazapine  7.5 mg Oral QHS  . sodium chloride  3 mL Intravenous Q12H    Continuous Infusions:    PRN Meds: sodium chloride, acetaminophen, clotrimazole, docusate sodium, glycopyrrolate, HYDROmorphone, HYDROmorphone, levalbuterol, LORazepam, ondansetron (ZOFRAN) IV, promethazine, sodium chloride, sodium chloride  Palliative Performance Scale: 40%     Vital Signs: BP 154/45 mmHg  Pulse 62  Temp(Src) 97.9 F (36.6 C) (Oral)  Resp 16  Ht 5\' 4"  (1.626 m)  Wt 79 kg (174 lb 2.6 oz)  BMI 29.88 kg/m2  SpO2 99% SpO2: SpO2: 99 % O2 Device: O2 Device: Nasal Cannula O2 Flow Rate: O2 Flow Rate (L/min): 2 L/min  Intake/output summary:  Intake/Output Summary (Last 24 hours) at 11/17/14 1343 Last data filed at 11/17/14 1200  Gross per 24 hour  Intake    120 ml  Output    875 ml  Net   -755 ml   LBM:   Baseline Weight: Weight: 74.39 kg (164 lb) Most recent weight: Weight: 79 kg (174 lb 2.6 oz)  Physical Exam: Elderly, pale, more alter today        Additional Data Reviewed: Recent Labs     11/14/14  2000  11/16/14  0435  WBC  9.9   --   HGB  9.7*   --   PLT  202   --   NA  127*  128*  BUN  75*  79*  CREATININE  4.31*  4.33*     Problem List:  Patient Active Problem List   Diagnosis Date Noted  . AKI (acute kidney injury)   . CHF exacerbation 11/15/2014  . Diarrhea  11/15/2014  . Pressure ulcer 11/15/2014  . Atrial fibrillation 11/14/2014  . Acute CHF 11/14/2014  . Acute renal failure superimposed on stage 4 chronic kidney disease 10/28/2014  . Cholecystitis   . Aortic stenosis, severe   . Preoperative cardiovascular examination   . Palliative care encounter 09/27/2014  . SOB (shortness of breath)   . Clostridium difficile diarrhea   . Flank pain 09/22/2014  . Nausea, vomiting and diarrhea 09/22/2014  . Dehydration 09/22/2014  . Abdominal pain 09/22/2014  . Leukocytosis 09/22/2014  . Nausea with vomiting   . Creatinine elevation   . Bile duct obstruction   . Pleural effusion on right   . Pleural effusion, right 07/14/2014  . Acute on chronic respiratory failure with hypoxia 07/14/2014  . Pleural effusion   . Lung mass   . DNR (do not resuscitate)   . Choledocholithiasis with acute cholecystitis 06/14/2014  . Acute on chronic diastolic CHF (congestive heart failure) 05/13/2014  . Chronic kidney disease   . Type 2 diabetes mellitus with diabetic chronic kidney disease   . Acute cholecystitis 05/09/2014  . Chronic diastolic CHF (congestive heart failure)   . CKD (chronic kidney disease), stage IV   .  Severe aortic stenosis   . Hypoxia 03/15/2014  . Respiratory failure 03/15/2014  . Pneumonia 03/12/2014  . CAP (community acquired pneumonia) 03/12/2014  . Dilated aortic root 10/20/2013  . Chronic atrial fibrillation   . Diastolic dysfunction   . Aftercare following surgery of the circulatory system, [place] 08/20/2012  . Encounter for adequacy testing for hemodialysis 08/06/2012  . Chronic kidney disease (CKD), stage IV (severe) 04/27/2012  . RUQ abdominal pain 12/22/2011  . Thrombocytopenia 12/22/2011  . Colon cancer 12/22/2011  . Anemia 12/06/2011  . Type II diabetes mellitus with complication 44/03/270  . Hypertension 12/06/2011  . Hypokalemia 12/06/2011  . UTI (lower urinary tract infection) 12/06/2011  . Open wound of elbow,  complicated 53/66/4403     Palliative Care Assessment & Plan    Code Status:  DNR, signed for for discharge  Goals of Care:  Full Comfort  Symptom Management:  Sublingual Hydromorphone PRN pain dyspnea  Ativan Intensol PRN agitation  Palliative Prophylaxis:  Bowel regimen of choice  Psycho-social/Spiritual:  Desire for further Chaplaincy support:no   Prognosis: < 2 weeks Discharge Planning: Home with Hospice   Care plan was discussed with Patient, her daughter, CSW.  Thank you for allowing the Palliative Medicine Team to assist in the care of this patient.   Time In: 12 Time Out: 12:25 Total Time 25 min Prolonged Time Billed no    Greater than 50%  of this time was spent counseling and coordinating care related to the above assessment and plan.   Acquanetta Chain, DO  11/17/2014, 1:43 PM  Please contact Palliative Medicine Team phone at [phone] for questions and concerns.

## 2014-11-20 ENCOUNTER — Encounter: Payer: Self-pay | Admitting: *Deleted

## 2014-11-20 ENCOUNTER — Other Ambulatory Visit: Payer: Self-pay | Admitting: *Deleted

## 2014-11-20 NOTE — Patient Outreach (Signed)
11/20/14- Telephone call to patient's home, spoke with granddaughter Velna Hatchet (permission given by pt), HIPAA verified, Brandi reports Hospice saw pt for home visit on 11/17/14 and will be seeing pt today, Velna Hatchet states they provided all medications for pt, oxygen and "everything she needs", case closed to [facility] care management, letter sent to primary MD Dr. Maceo Pro informing of case closure.  Jacqlyn Larsen [facility] [place] Coordinator [phone]

## 2014-12-13 ENCOUNTER — Ambulatory Visit: Payer: Medicare Other | Admitting: Cardiology

## 2014-12-23 NOTE — Patient Outreach (Signed)
[facility]) Care Management  11/20/2014  LODEMA PARMA Jun 11, 1925 [id number]   Notification from Jacqlyn Larsen, RN to close case as patient is enrolled with Hospice.  Thanks, Ronnell Freshwater. [place], [place] Assistant Phone: [phone] Fax: [phone]

## 2014-12-23 DEATH — deceased

## 2015-03-28 NOTE — Telephone Encounter (Signed)
PER DANA DUNN TO CALL ABOUT MORE INTRUCTIONS SPOKE TO PT DAUGHTER ABOUT LAB RESULTS AND TO CONTACT PCP ABOUT POTASSIUM LEVELS AND PLAN TO FOLLOW UP AND ALSO FOLLOW UP WITH NEPHROLOGY SOONER THAN FEBBRUARY

## 2016-01-20 IMAGING — CR DG CHEST 2V
2 series · 2 of 2 positions shown · non-contrast
Comparison: 07/31/2014

CLINICAL DATA: Follow-up right pleural effusion.

EXAM:
CHEST  2 VIEW

[view not recorded (1 of 2)]
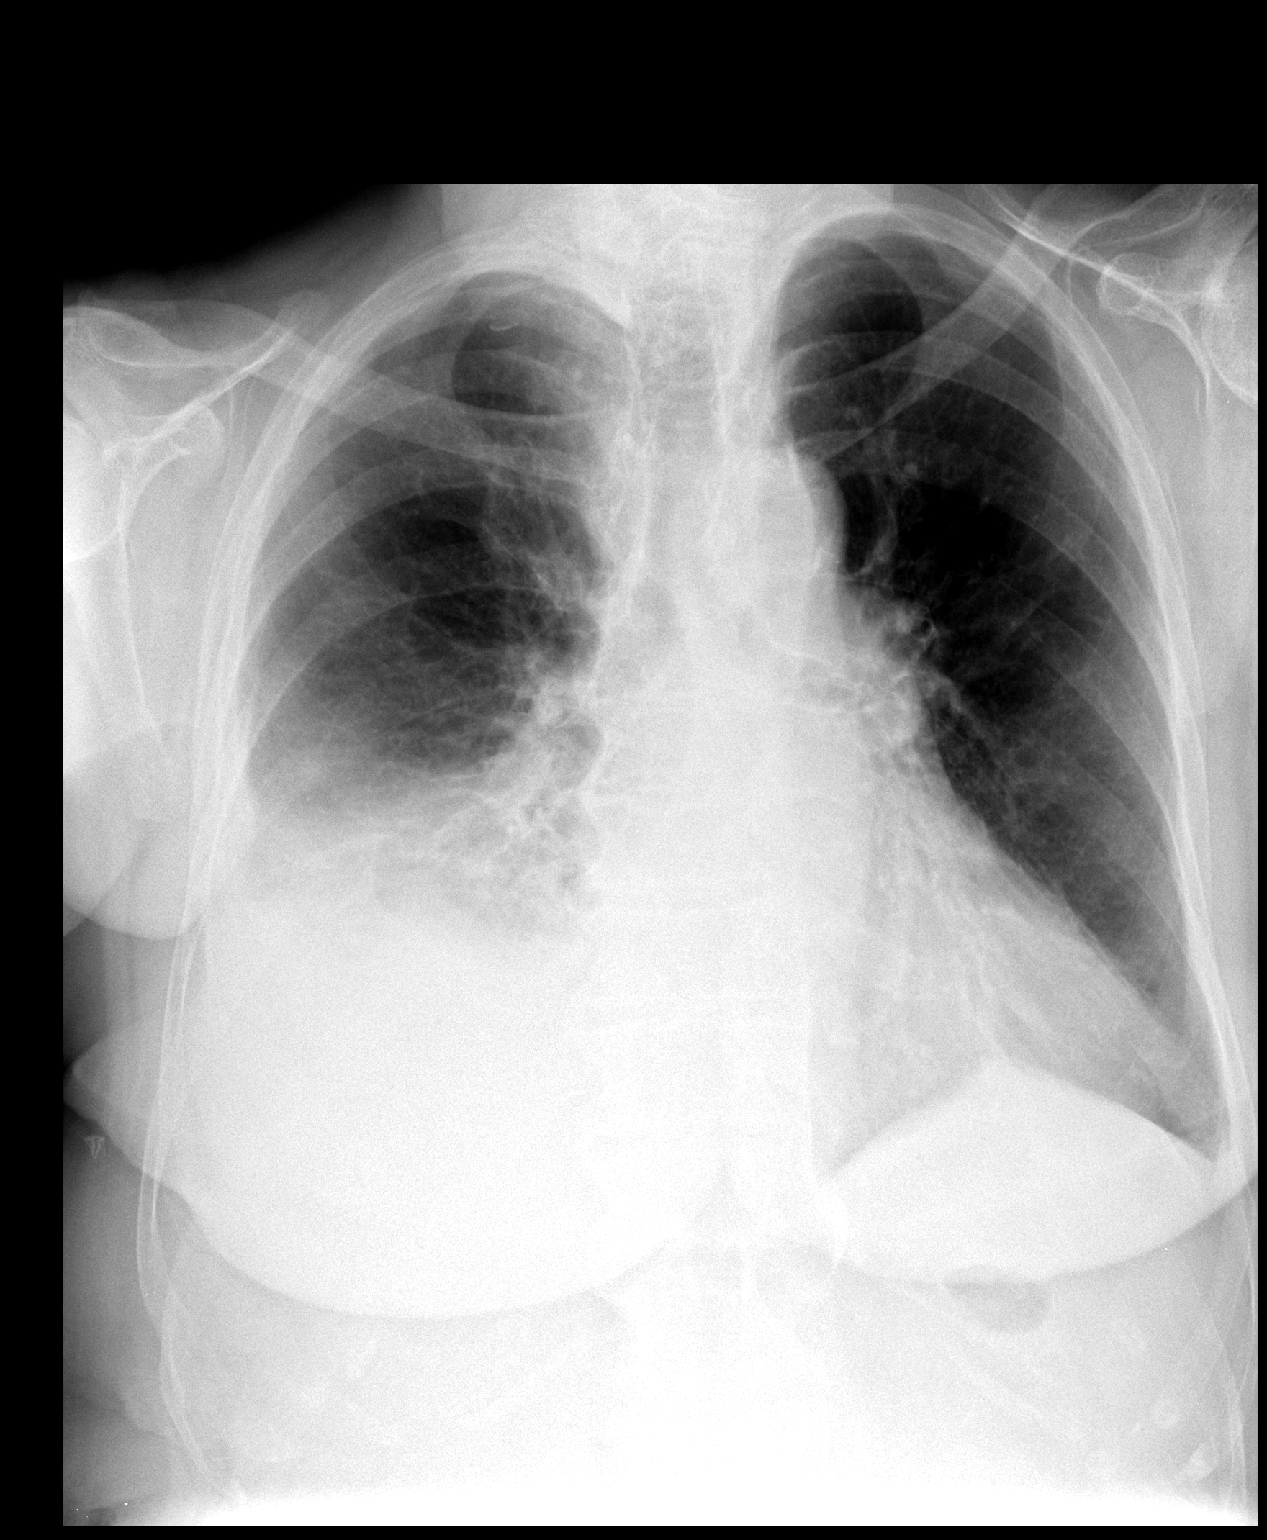

[view not recorded (2 of 2)]
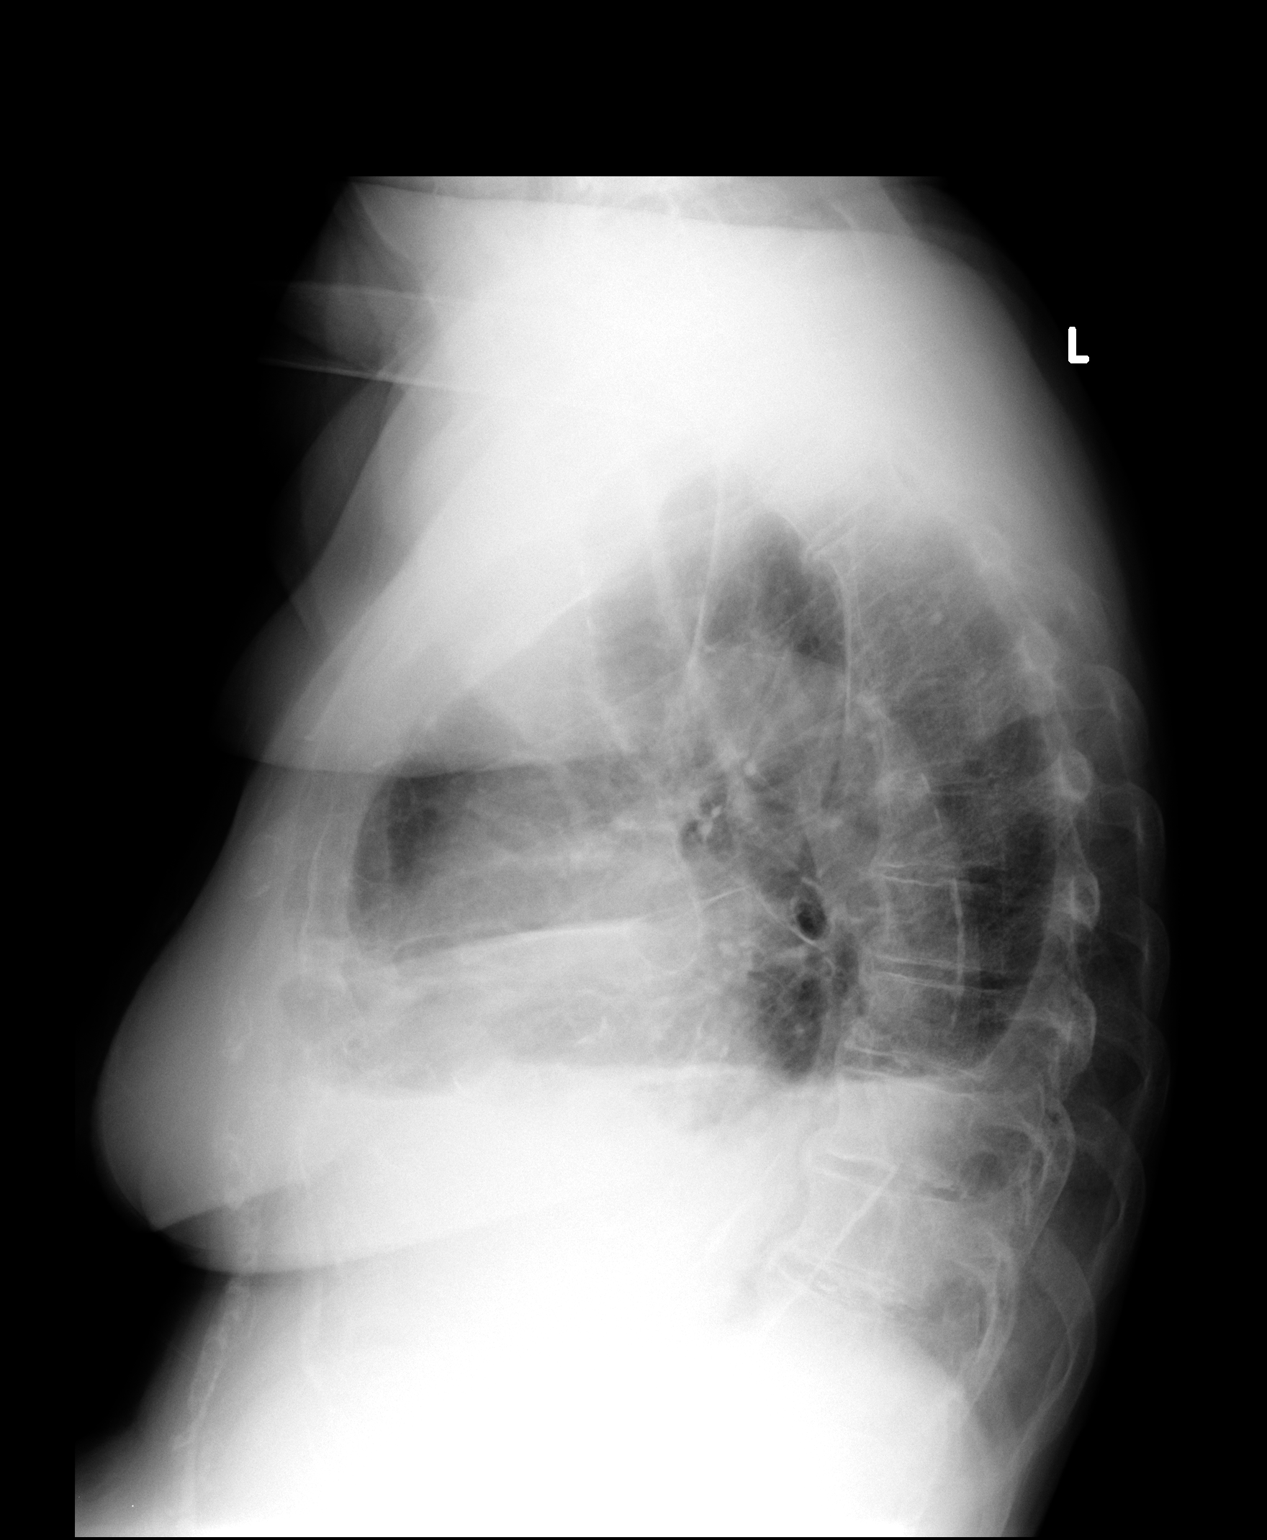

[2 of 2 positions shown; findings below may reference images not displayed]

FINDINGS: Small-moderate right pleural effusion unchanged the prior exam.
There is right basilar atelectasis. The left lung is clear. No
pneumothorax. Stable cardiomegaly.

No acute osseous abnormality.
IMPRESSION: Stable small-moderate right pleural effusion with basilar
atelectasis.

## 2016-03-05 IMAGING — DX DG CHEST 2V
2 series · 2 of 2 positions shown · non-contrast
Comparison: 09/12/2014

CLINICAL DATA: Pt coming from home with chief complaint of flank
pain. Pt has other multiple complaints, drain for pleural effusion,
gall bladder drain removed on [DATE]. Pt reports nausea and vomiting
since around [id number].

EXAM:
CHEST  2 VIEW

[chest lat]
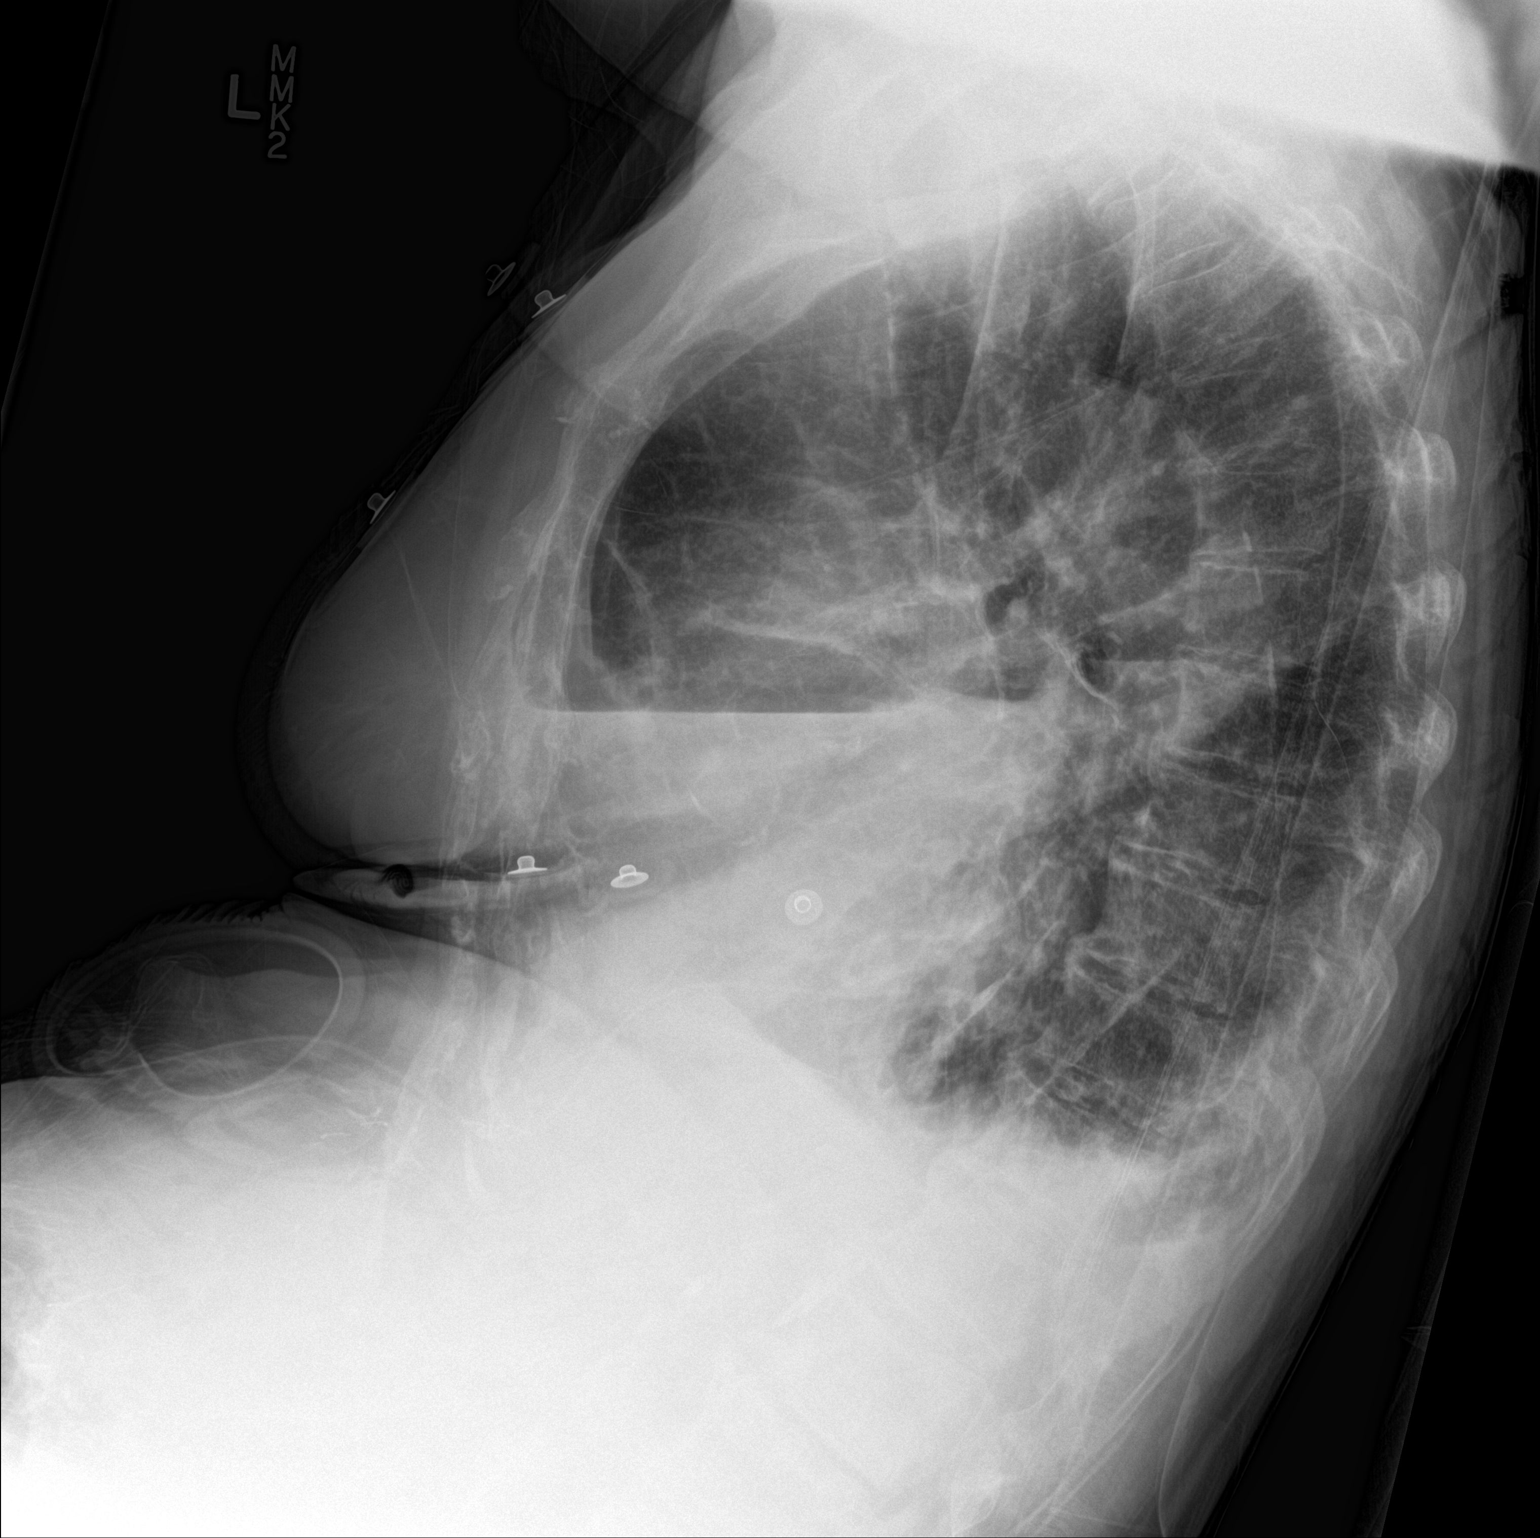

[chest ap]
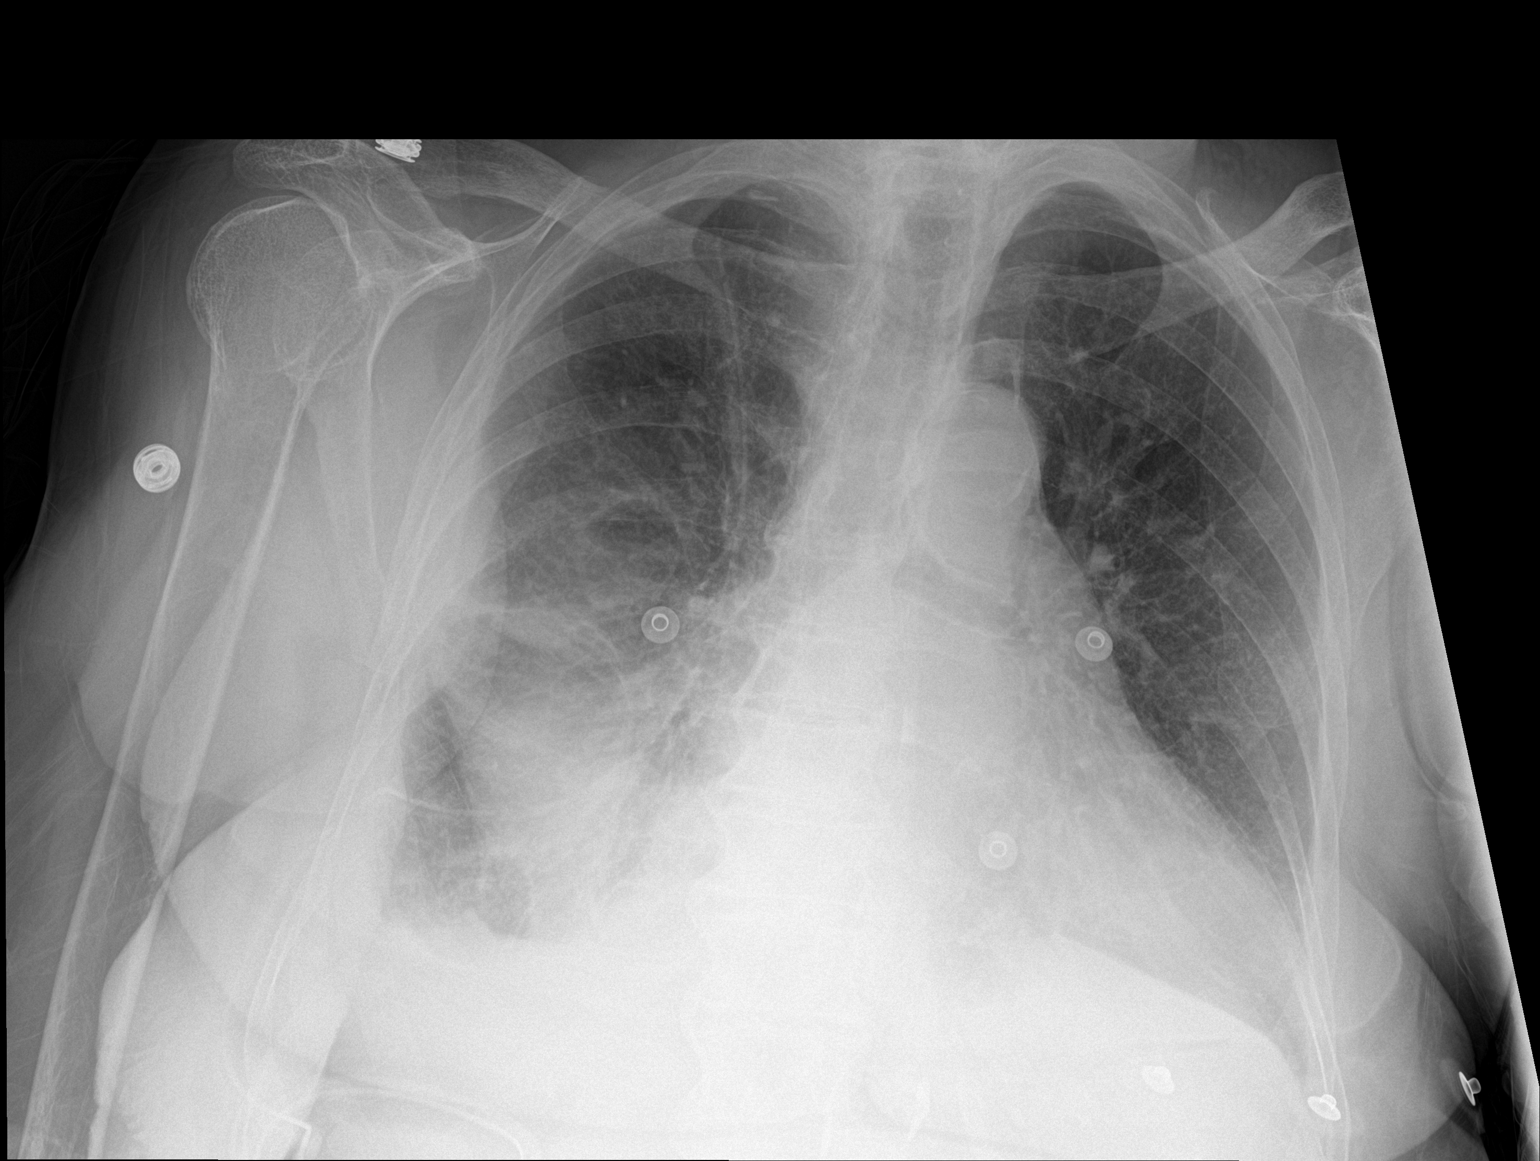

[2 of 2 positions shown; findings below may reference images not displayed]

FINDINGS: Right-sided chest tube has its tip near the right apex. This is new
since the prior exam.

Right loculated hydro pneumothoraces project in the anterior mid to
lower hemithorax. Hydro pneumothorax noted more posteriorly on the
prior study is no longer evident.

Small bilateral pleural effusions, decreased on the right from the
prior study. Right lower lung zone opacity is improved when compared
the prior study. Left lung is essentially clear. No pulmonary edema.

No apical pneumothorax.

Cardiac silhouette is normal in size.
IMPRESSION: 1. There has been some improvement in the right lung aeration since
the prior study subsequent to placement of a right-sided chest tube.
There is decreased pleural fluid on the right. A posterior hydro
pneumothorax on the right is no longer evident. More anterior
loculated hydro pneumothoraces persist on the current study.
2. No new abnormalities.

## 2016-04-20 IMAGING — CR DG CHEST 2V
2 series · 2 of 2 positions shown · non-contrast
Comparison: Chest x-ray of 10/31/2014

CLINICAL DATA: Followup right pleural effusion, chest tube with
drainage

EXAM:
CHEST  2 VIEW

[w chest pa]
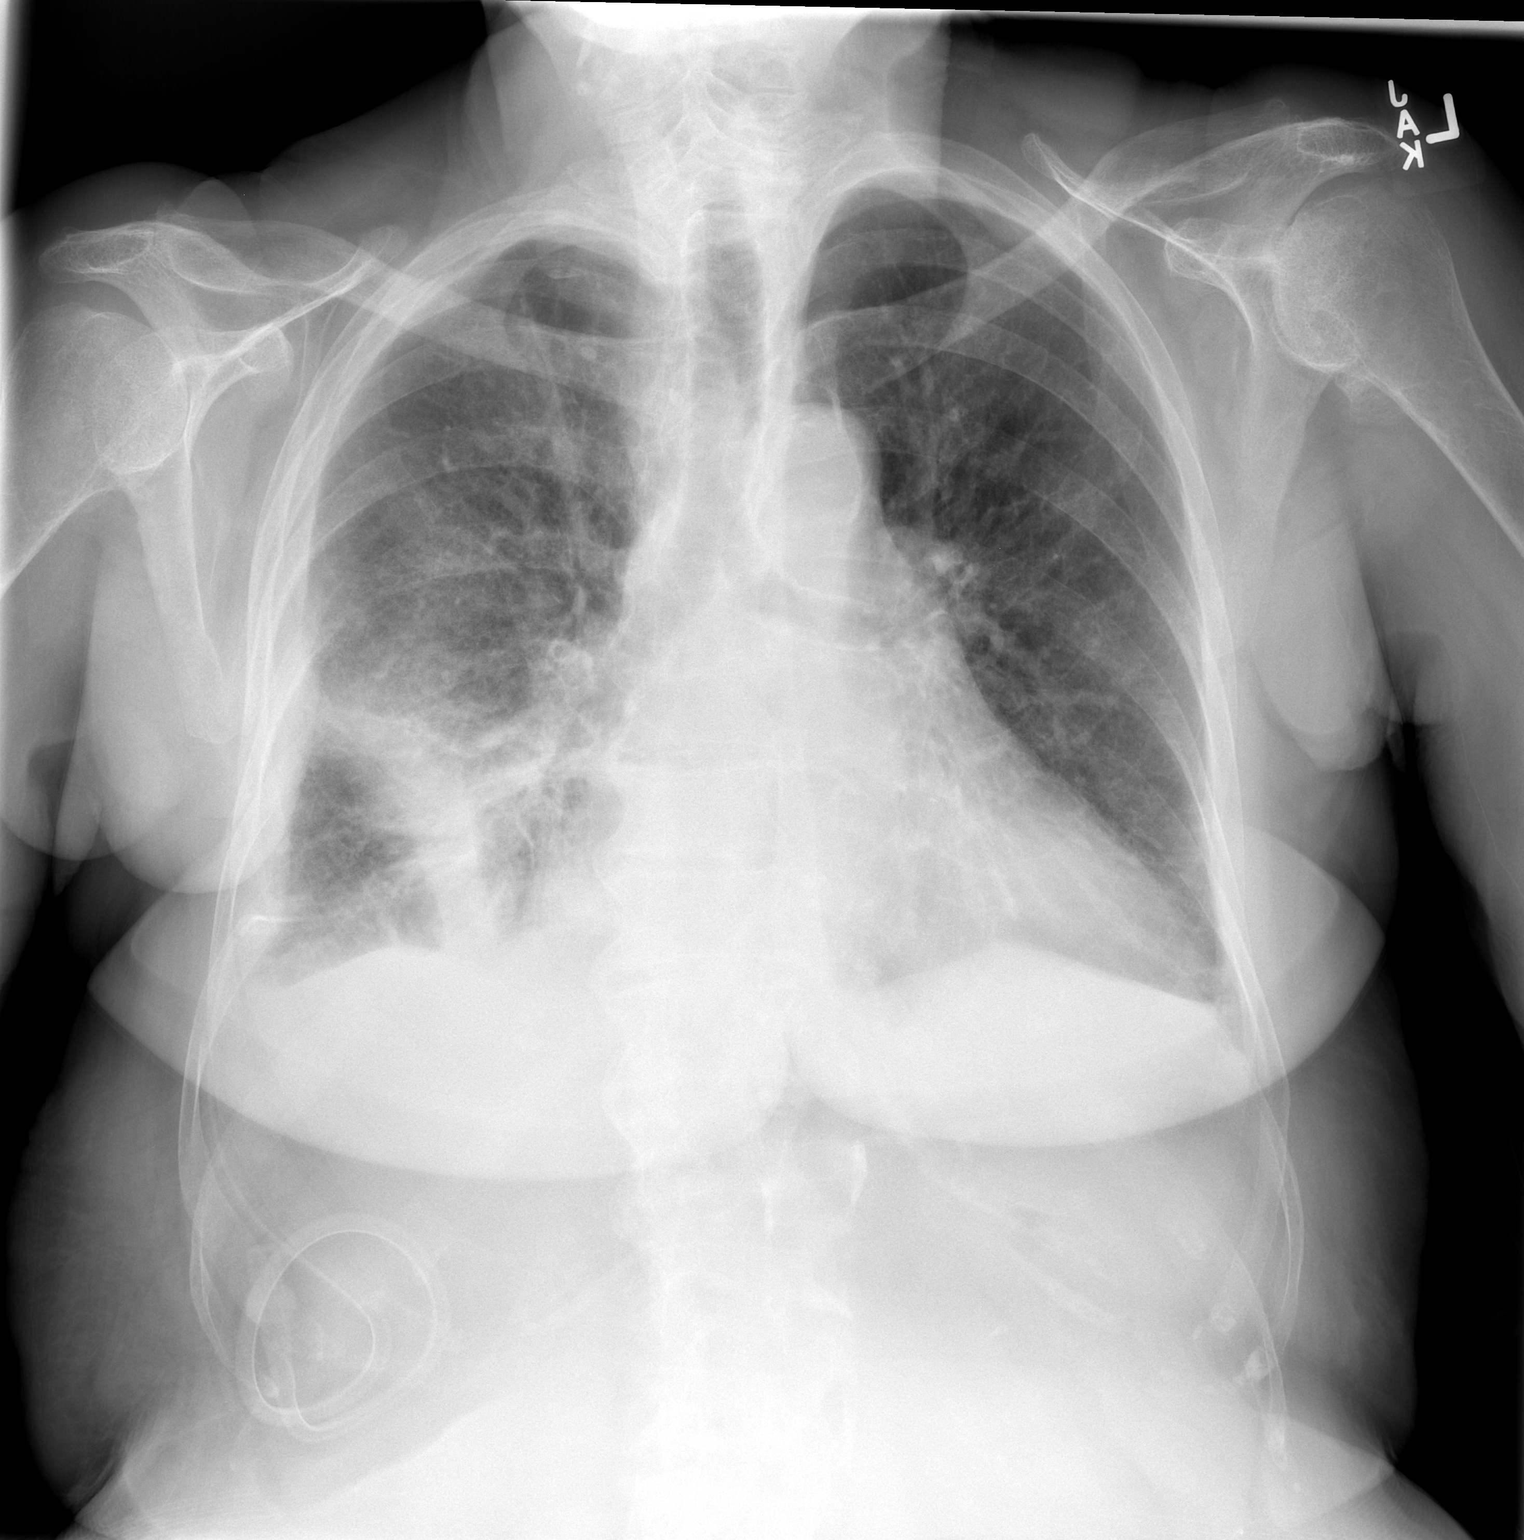

[w chest lat]
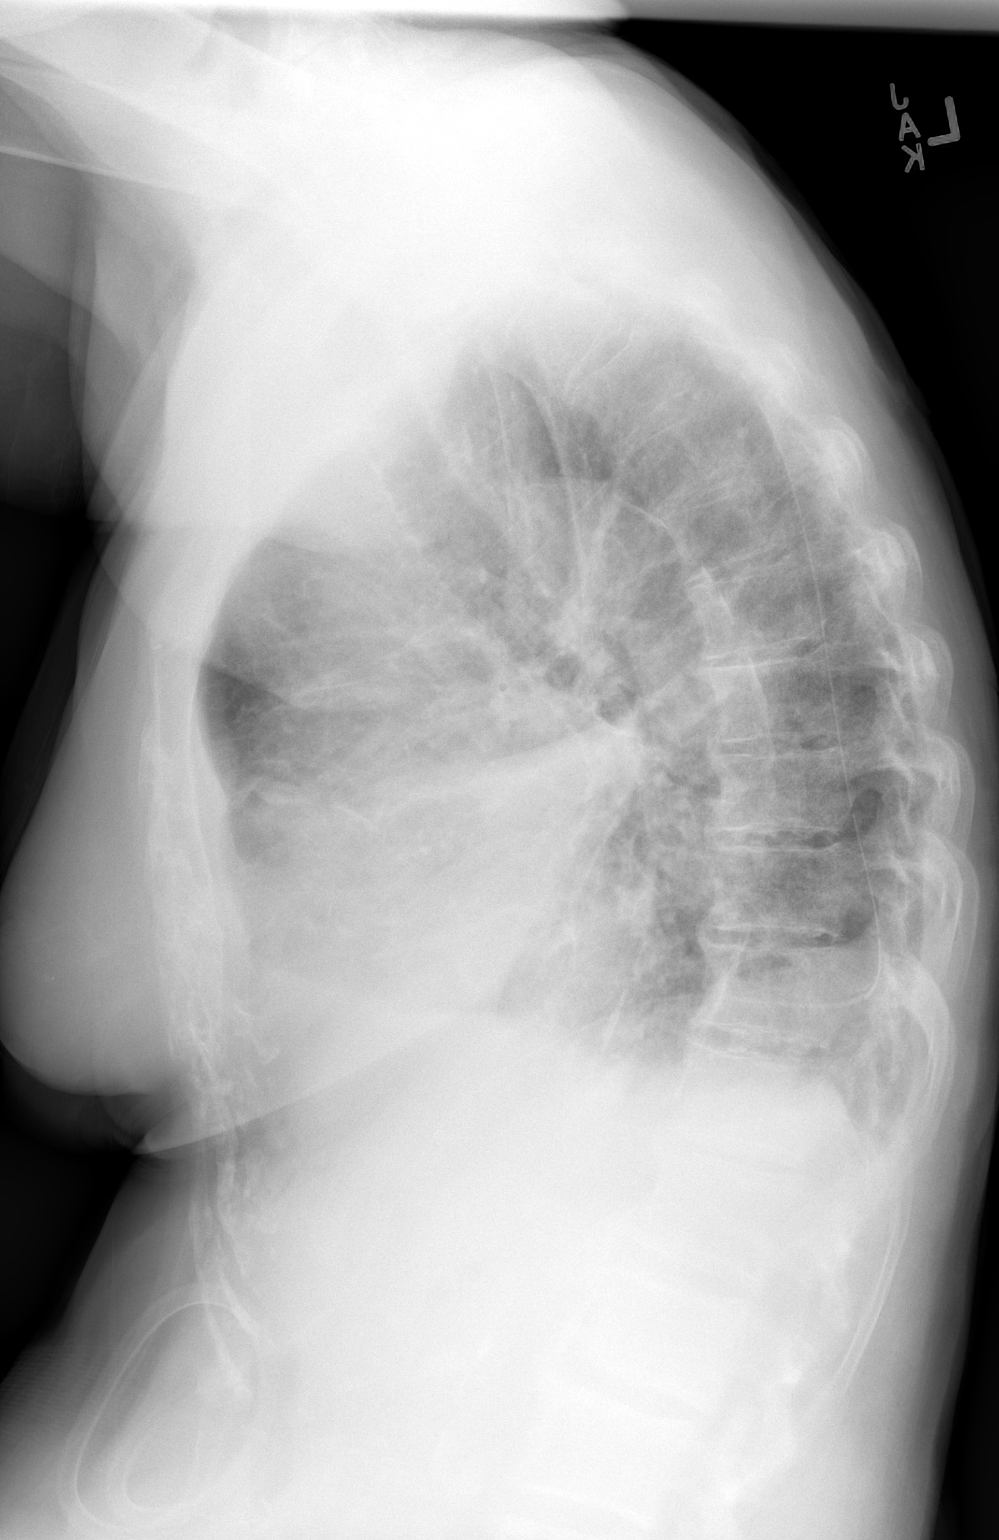

[2 of 2 positions shown; findings below may reference images not displayed]

FINDINGS: The right PleurX catheter remains in place with a persistent small
right pleural effusion and right basilar atelectasis. The left lung
is clear. Cardiomegaly is stable. There are degenerative changes
throughout the thoracic spine.
IMPRESSION: 1. Small right pleural effusion remains with right PleurX catheter
in place. Right basilar atelectasis is noted.
2. Stable cardiomegaly.
